# Patient Record
Sex: Female | Born: 1945 | Race: White | Hispanic: No | Marital: Married | Smoking: Never smoker
Health system: Southern US, Community
[De-identification: ages and names within clinical notes are randomized; demographics above are authoritative.]

## PROBLEM LIST (undated history)

## (undated) DIAGNOSIS — E7800 Pure hypercholesterolemia, unspecified: Secondary | ICD-10-CM

## (undated) DIAGNOSIS — R51 Headache: Secondary | ICD-10-CM

## (undated) DIAGNOSIS — M419 Scoliosis, unspecified: Secondary | ICD-10-CM

## (undated) DIAGNOSIS — G500 Trigeminal neuralgia: Secondary | ICD-10-CM

## (undated) DIAGNOSIS — R5383 Other fatigue: Secondary | ICD-10-CM

## (undated) DIAGNOSIS — M7989 Other specified soft tissue disorders: Secondary | ICD-10-CM

## (undated) DIAGNOSIS — H539 Unspecified visual disturbance: Secondary | ICD-10-CM

## (undated) DIAGNOSIS — M4800 Spinal stenosis, site unspecified: Secondary | ICD-10-CM

## (undated) DIAGNOSIS — I10 Essential (primary) hypertension: Secondary | ICD-10-CM

## (undated) DIAGNOSIS — Z87442 Personal history of urinary calculi: Secondary | ICD-10-CM

## (undated) DIAGNOSIS — M549 Dorsalgia, unspecified: Secondary | ICD-10-CM

## (undated) DIAGNOSIS — G4752 REM sleep behavior disorder: Secondary | ICD-10-CM

## (undated) DIAGNOSIS — C801 Malignant (primary) neoplasm, unspecified: Secondary | ICD-10-CM

## (undated) DIAGNOSIS — M1990 Unspecified osteoarthritis, unspecified site: Secondary | ICD-10-CM

## (undated) DIAGNOSIS — R112 Nausea with vomiting, unspecified: Secondary | ICD-10-CM

## (undated) DIAGNOSIS — R0600 Dyspnea, unspecified: Secondary | ICD-10-CM

## (undated) DIAGNOSIS — K219 Gastro-esophageal reflux disease without esophagitis: Secondary | ICD-10-CM

## (undated) DIAGNOSIS — M069 Rheumatoid arthritis, unspecified: Secondary | ICD-10-CM

## (undated) DIAGNOSIS — Z9889 Other specified postprocedural states: Secondary | ICD-10-CM

## (undated) DIAGNOSIS — R251 Tremor, unspecified: Secondary | ICD-10-CM

## (undated) HISTORY — PX: ABDOMINAL HYSTERECTOMY: SHX81

## (undated) HISTORY — PX: AUGMENTATION MAMMAPLASTY: SUR837

## (undated) HISTORY — DX: Trigeminal neuralgia: G50.0

## (undated) HISTORY — PX: TONSILLECTOMY: SUR1361

## (undated) HISTORY — PX: KIDNEY STONE SURGERY: SHX686

## (undated) HISTORY — PX: COLONOSCOPY: SHX174

## (undated) HISTORY — DX: Unspecified visual disturbance: H53.9

## (undated) HISTORY — PX: JOINT REPLACEMENT: SHX530

## (undated) HISTORY — PX: MOHS SURGERY: SUR867

## (undated) HISTORY — PX: TOTAL HIP ARTHROPLASTY: SHX124

---

## 1998-10-07 ENCOUNTER — Other Ambulatory Visit: Admission: RE | Admit: 1998-10-07 | Discharge: 1998-10-07 | Payer: Self-pay | Admitting: Obstetrics and Gynecology

## 1999-02-05 ENCOUNTER — Emergency Department (HOSPITAL_COMMUNITY): Admission: EM | Admit: 1999-02-05 | Discharge: 1999-02-05 | Payer: Self-pay | Admitting: Emergency Medicine

## 1999-02-05 ENCOUNTER — Encounter: Payer: Self-pay | Admitting: Emergency Medicine

## 2000-01-13 ENCOUNTER — Other Ambulatory Visit: Admission: RE | Admit: 2000-01-13 | Discharge: 2000-01-13 | Payer: Self-pay | Admitting: Obstetrics and Gynecology

## 2001-06-04 ENCOUNTER — Encounter: Admission: RE | Admit: 2001-06-04 | Discharge: 2001-06-04 | Payer: Self-pay | Admitting: Obstetrics and Gynecology

## 2001-06-04 ENCOUNTER — Encounter: Payer: Self-pay | Admitting: Obstetrics and Gynecology

## 2002-09-09 ENCOUNTER — Encounter: Payer: Self-pay | Admitting: Obstetrics and Gynecology

## 2002-09-09 ENCOUNTER — Encounter: Admission: RE | Admit: 2002-09-09 | Discharge: 2002-09-09 | Payer: Self-pay | Admitting: Obstetrics and Gynecology

## 2003-10-09 ENCOUNTER — Encounter: Admission: RE | Admit: 2003-10-09 | Discharge: 2003-10-09 | Payer: Self-pay | Admitting: Obstetrics and Gynecology

## 2003-10-09 ENCOUNTER — Encounter: Payer: Self-pay | Admitting: Obstetrics and Gynecology

## 2003-11-10 ENCOUNTER — Other Ambulatory Visit: Admission: RE | Admit: 2003-11-10 | Discharge: 2003-11-10 | Payer: Self-pay | Admitting: Obstetrics and Gynecology

## 2004-11-08 ENCOUNTER — Encounter: Admission: RE | Admit: 2004-11-08 | Discharge: 2004-11-08 | Payer: Self-pay | Admitting: Obstetrics and Gynecology

## 2006-02-14 ENCOUNTER — Encounter: Admission: RE | Admit: 2006-02-14 | Discharge: 2006-02-14 | Payer: Self-pay | Admitting: Obstetrics and Gynecology

## 2006-04-04 ENCOUNTER — Other Ambulatory Visit: Admission: RE | Admit: 2006-04-04 | Discharge: 2006-04-04 | Payer: Self-pay | Admitting: Obstetrics and Gynecology

## 2007-02-15 ENCOUNTER — Encounter: Admission: RE | Admit: 2007-02-15 | Discharge: 2007-02-15 | Payer: Self-pay | Admitting: Obstetrics and Gynecology

## 2008-03-18 ENCOUNTER — Encounter: Admission: RE | Admit: 2008-03-18 | Discharge: 2008-03-18 | Payer: Self-pay | Admitting: Family Medicine

## 2008-09-03 ENCOUNTER — Encounter: Admission: RE | Admit: 2008-09-03 | Discharge: 2008-11-02 | Payer: Self-pay | Admitting: Orthopedic Surgery

## 2009-02-03 ENCOUNTER — Encounter: Admission: RE | Admit: 2009-02-03 | Discharge: 2009-02-03 | Payer: Self-pay | Admitting: Obstetrics and Gynecology

## 2009-04-22 ENCOUNTER — Encounter: Admission: RE | Admit: 2009-04-22 | Discharge: 2009-04-22 | Payer: Self-pay | Admitting: Obstetrics and Gynecology

## 2009-10-04 ENCOUNTER — Encounter (HOSPITAL_COMMUNITY): Admission: RE | Admit: 2009-10-04 | Discharge: 2009-11-26 | Payer: Self-pay | Admitting: Orthopedic Surgery

## 2010-01-19 ENCOUNTER — Inpatient Hospital Stay (HOSPITAL_COMMUNITY): Admission: RE | Admit: 2010-01-19 | Discharge: 2010-01-25 | Payer: Self-pay | Admitting: Orthopedic Surgery

## 2010-03-29 ENCOUNTER — Encounter: Admission: RE | Admit: 2010-03-29 | Discharge: 2010-06-16 | Payer: Self-pay | Admitting: Orthopedic Surgery

## 2010-05-05 ENCOUNTER — Encounter: Admission: RE | Admit: 2010-05-05 | Discharge: 2010-05-05 | Payer: Self-pay | Admitting: Obstetrics and Gynecology

## 2010-06-01 ENCOUNTER — Encounter: Admission: RE | Admit: 2010-06-01 | Discharge: 2010-06-01 | Payer: Self-pay | Admitting: Specialist

## 2010-06-29 ENCOUNTER — Encounter: Admission: RE | Admit: 2010-06-29 | Discharge: 2010-08-17 | Payer: Self-pay | Admitting: Specialist

## 2011-02-07 ENCOUNTER — Ambulatory Visit: Payer: MEDICARE | Admitting: Physical Therapy

## 2011-02-20 ENCOUNTER — Ambulatory Visit: Payer: MEDICARE | Attending: Orthopedic Surgery | Admitting: Physical Therapy

## 2011-02-20 DIAGNOSIS — M25669 Stiffness of unspecified knee, not elsewhere classified: Secondary | ICD-10-CM | POA: Insufficient documentation

## 2011-02-20 DIAGNOSIS — Z96659 Presence of unspecified artificial knee joint: Secondary | ICD-10-CM | POA: Insufficient documentation

## 2011-02-20 DIAGNOSIS — IMO0001 Reserved for inherently not codable concepts without codable children: Secondary | ICD-10-CM | POA: Insufficient documentation

## 2011-02-20 DIAGNOSIS — M25569 Pain in unspecified knee: Secondary | ICD-10-CM | POA: Insufficient documentation

## 2011-02-23 ENCOUNTER — Ambulatory Visit: Payer: MEDICARE | Admitting: Physical Therapy

## 2011-02-23 ENCOUNTER — Ambulatory Visit: Payer: MEDICARE | Attending: Orthopedic Surgery | Admitting: Physical Therapy

## 2011-02-23 DIAGNOSIS — M25569 Pain in unspecified knee: Secondary | ICD-10-CM | POA: Insufficient documentation

## 2011-02-23 DIAGNOSIS — Z96659 Presence of unspecified artificial knee joint: Secondary | ICD-10-CM | POA: Insufficient documentation

## 2011-02-23 DIAGNOSIS — IMO0001 Reserved for inherently not codable concepts without codable children: Secondary | ICD-10-CM | POA: Insufficient documentation

## 2011-02-23 DIAGNOSIS — M25669 Stiffness of unspecified knee, not elsewhere classified: Secondary | ICD-10-CM | POA: Insufficient documentation

## 2011-03-13 LAB — BASIC METABOLIC PANEL
BUN: 7 mg/dL (ref 6–23)
BUN: 7 mg/dL (ref 6–23)
CO2: 25 mEq/L (ref 19–32)
CO2: 26 mEq/L (ref 19–32)
Calcium: 7.8 mg/dL — ABNORMAL LOW (ref 8.4–10.5)
Calcium: 8.2 mg/dL — ABNORMAL LOW (ref 8.4–10.5)
Calcium: 8.7 mg/dL (ref 8.4–10.5)
Chloride: 94 mEq/L — ABNORMAL LOW (ref 96–112)
Chloride: 98 mEq/L (ref 96–112)
Creatinine, Ser: 0.58 mg/dL (ref 0.4–1.2)
Creatinine, Ser: 0.62 mg/dL (ref 0.4–1.2)
Creatinine, Ser: 0.63 mg/dL (ref 0.4–1.2)
Creatinine, Ser: 0.68 mg/dL (ref 0.4–1.2)
GFR calc Af Amer: >60 mL/min (ref >60)
GFR calc Af Amer: >60 mL/min (ref >60)
GFR calc Af Amer: >60 mL/min (ref >60)
GFR calc non Af Amer: >60 mL/min (ref >60)
GFR calc non Af Amer: >60 mL/min (ref >60)
GFR calc non Af Amer: >60 mL/min (ref >60)
GFR calc non Af Amer: >60 mL/min (ref >60)
Glucose, Bld: 102 mg/dL — ABNORMAL HIGH (ref 70–99)
Glucose, Bld: 129 mg/dL — ABNORMAL HIGH (ref 70–99)
Glucose, Bld: 92 mg/dL (ref 70–99)
Glucose, Bld: 96 mg/dL (ref 70–99)
Potassium: 4.2 mEq/L (ref 3.5–5.1)
Potassium: 4.5 mEq/L (ref 3.5–5.1)
Potassium: 4.7 mEq/L (ref 3.5–5.1)
Sodium: 129 mEq/L — ABNORMAL LOW (ref 135–145)
Sodium: 130 mEq/L — ABNORMAL LOW (ref 135–145)
Sodium: 131 mEq/L — ABNORMAL LOW (ref 135–145)

## 2011-03-13 LAB — PROTIME-INR
INR: 1.08 (ref 0.00–1.49)
INR: 1.22 (ref 0.00–1.49)
INR: 1.6 — ABNORMAL HIGH (ref 0.00–1.49)
INR: 1.99 — ABNORMAL HIGH (ref 0.00–1.49)
INR: 2.14 — ABNORMAL HIGH (ref 0.00–1.49)
INR: 2.29 — ABNORMAL HIGH (ref 0.00–1.49)
Prothrombin Time: 15.3 seconds — ABNORMAL HIGH (ref 11.6–15.2)

## 2011-03-13 LAB — HEPATIC FUNCTION PANEL
Bilirubin, Direct: 0.1 mg/dL (ref 0.0–0.3)
Indirect Bilirubin: 0.4 mg/dL (ref 0.3–0.9)

## 2011-03-13 LAB — URINE CULTURE: Special Requests: NEGATIVE

## 2011-03-13 LAB — COMPREHENSIVE METABOLIC PANEL
ALT: 15 U/L (ref 0–35)
AST: 20 U/L (ref 0–37)
Albumin: 3.7 g/dL (ref 3.5–5.2)
BUN: 18 mg/dL (ref 6–23)
Calcium: 8.9 mg/dL (ref 8.4–10.5)
GFR calc Af Amer: >60 mL/min (ref >60)
GFR calc non Af Amer: >60 mL/min (ref >60)
Glucose, Bld: 88 mg/dL (ref 70–99)
Sodium: 138 mEq/L (ref 135–145)

## 2011-03-13 LAB — CBC
HCT: 28.3 % — ABNORMAL LOW (ref 36.0–46.0)
HCT: 29.8 % — ABNORMAL LOW (ref 36.0–46.0)
Hemoglobin: 10.4 g/dL — ABNORMAL LOW (ref 12.0–15.0)
Hemoglobin: 12.9 g/dL (ref 12.0–15.0)
Hemoglobin: 9 g/dL — ABNORMAL LOW (ref 12.0–15.0)
Hemoglobin: 9.4 g/dL — ABNORMAL LOW (ref 12.0–15.0)
MCHC: 33.3 g/dL (ref 30.0–36.0)
MCHC: 33.5 g/dL (ref 30.0–36.0)
MCHC: 34.1 g/dL (ref 30.0–36.0)
MCHC: 35.1 g/dL (ref 30.0–36.0)
MCV: 93 fL (ref 78.0–100.0)
MCV: 93.1 fL (ref 78.0–100.0)
MCV: 93.8 fL (ref 78.0–100.0)
MCV: 93.9 fL (ref 78.0–100.0)
Platelets: 130 10*3/uL — ABNORMAL LOW (ref 150–400)
RBC: 2.98 MIL/uL — ABNORMAL LOW (ref 3.87–5.11)
RBC: 3.04 MIL/uL — ABNORMAL LOW (ref 3.87–5.11)
RDW: 12.2 % (ref 11.5–15.5)
RDW: 12.5 % (ref 11.5–15.5)
RDW: 12.9 % (ref 11.5–15.5)
RDW: 13.3 % (ref 11.5–15.5)

## 2011-03-13 LAB — URIC ACID: Uric Acid, Serum: 1.9 mg/dL — ABNORMAL LOW (ref 2.4–7.0)

## 2011-03-13 LAB — OSMOLALITY: Osmolality: 250 mOsm/kg — ABNORMAL LOW (ref 275–300)

## 2011-03-13 LAB — URINALYSIS, ROUTINE W REFLEX MICROSCOPIC
Bilirubin Urine: NEGATIVE
Bilirubin Urine: NEGATIVE
Glucose, UA: NEGATIVE mg/dL
Hgb urine dipstick: NEGATIVE
Hgb urine dipstick: NEGATIVE
Ketones, ur: NEGATIVE mg/dL
Ketones, ur: NEGATIVE mg/dL
Nitrite: NEGATIVE
Specific Gravity, Urine: 1.013 (ref 1.005–1.030)
Specific Gravity, Urine: 1.019 (ref 1.005–1.030)
pH: 7.5 (ref 5.0–8.0)

## 2011-03-13 LAB — BLOOD GAS, ARTERIAL
Acid-base deficit: 0.5 mmol/L (ref 0.0–2.0)
Drawn by: 275531
O2 Saturation: 98.6 %
Patient temperature: 98.6

## 2011-03-13 LAB — TSH: TSH: 1.387 u[IU]/mL (ref 0.350–4.500)

## 2011-03-13 LAB — URINE MICROSCOPIC-ADD ON

## 2011-03-13 LAB — ABO/RH: ABO/RH(D): O POS

## 2011-03-13 LAB — TYPE AND SCREEN: ABO/RH(D): O POS

## 2011-03-16 LAB — BASIC METABOLIC PANEL
BUN: 8 mg/dL (ref 6–23)
GFR calc non Af Amer: >60 mL/min (ref >60)
Glucose, Bld: 91 mg/dL (ref 70–99)
Potassium: 4.1 mEq/L (ref 3.5–5.1)

## 2011-05-25 ENCOUNTER — Other Ambulatory Visit: Payer: Self-pay | Admitting: Obstetrics and Gynecology

## 2011-05-25 DIAGNOSIS — Z1231 Encounter for screening mammogram for malignant neoplasm of breast: Secondary | ICD-10-CM

## 2011-05-31 ENCOUNTER — Ambulatory Visit
Admission: RE | Admit: 2011-05-31 | Discharge: 2011-05-31 | Disposition: A | Discharge status: Home / Self Care | Payer: Medicare Other | Source: Ambulatory Visit | Attending: Obstetrics and Gynecology | Admitting: Obstetrics and Gynecology

## 2011-05-31 DIAGNOSIS — Z1231 Encounter for screening mammogram for malignant neoplasm of breast: Secondary | ICD-10-CM

## 2011-09-27 ENCOUNTER — Other Ambulatory Visit: Payer: Self-pay | Admitting: Orthopedic Surgery

## 2011-09-27 DIAGNOSIS — M545 Low back pain, unspecified: Secondary | ICD-10-CM

## 2011-10-12 ENCOUNTER — Ambulatory Visit
Admission: RE | Admit: 2011-10-12 | Discharge: 2011-10-12 | Disposition: A | Discharge status: Home / Self Care | Payer: Medicare Other | Source: Ambulatory Visit | Attending: Orthopedic Surgery | Admitting: Orthopedic Surgery

## 2011-10-12 DIAGNOSIS — M545 Low back pain: Secondary | ICD-10-CM

## 2012-02-07 ENCOUNTER — Ambulatory Visit: Payer: Medicare Other | Attending: Orthopedic Surgery | Admitting: Physical Therapy

## 2012-02-07 DIAGNOSIS — IMO0001 Reserved for inherently not codable concepts without codable children: Secondary | ICD-10-CM | POA: Insufficient documentation

## 2012-02-07 DIAGNOSIS — M25673 Stiffness of unspecified ankle, not elsewhere classified: Secondary | ICD-10-CM | POA: Insufficient documentation

## 2012-02-07 DIAGNOSIS — M25676 Stiffness of unspecified foot, not elsewhere classified: Secondary | ICD-10-CM | POA: Insufficient documentation

## 2012-02-07 DIAGNOSIS — M25579 Pain in unspecified ankle and joints of unspecified foot: Secondary | ICD-10-CM | POA: Insufficient documentation

## 2012-02-09 ENCOUNTER — Ambulatory Visit: Payer: Medicare Other | Admitting: Physical Therapy

## 2012-02-12 ENCOUNTER — Ambulatory Visit: Payer: Medicare Other | Admitting: Physical Therapy

## 2012-02-14 ENCOUNTER — Ambulatory Visit: Payer: Medicare Other | Admitting: Physical Therapy

## 2012-03-01 ENCOUNTER — Ambulatory Visit: Payer: Medicare Other | Attending: Orthopedic Surgery | Admitting: Physical Therapy

## 2012-03-01 DIAGNOSIS — M25676 Stiffness of unspecified foot, not elsewhere classified: Secondary | ICD-10-CM | POA: Insufficient documentation

## 2012-03-01 DIAGNOSIS — M25673 Stiffness of unspecified ankle, not elsewhere classified: Secondary | ICD-10-CM | POA: Insufficient documentation

## 2012-03-01 DIAGNOSIS — IMO0001 Reserved for inherently not codable concepts without codable children: Secondary | ICD-10-CM | POA: Insufficient documentation

## 2012-03-01 DIAGNOSIS — M25579 Pain in unspecified ankle and joints of unspecified foot: Secondary | ICD-10-CM | POA: Insufficient documentation

## 2012-04-29 ENCOUNTER — Other Ambulatory Visit: Payer: Self-pay | Admitting: Obstetrics and Gynecology

## 2012-04-29 DIAGNOSIS — Z1231 Encounter for screening mammogram for malignant neoplasm of breast: Secondary | ICD-10-CM

## 2012-05-14 ENCOUNTER — Encounter (HOSPITAL_BASED_OUTPATIENT_CLINIC_OR_DEPARTMENT_OTHER): Payer: Self-pay | Admitting: Emergency Medicine

## 2012-05-14 ENCOUNTER — Emergency Department (HOSPITAL_BASED_OUTPATIENT_CLINIC_OR_DEPARTMENT_OTHER)
Admission: EM | Admit: 2012-05-14 | Discharge: 2012-05-14 | Disposition: A | Discharge status: Home / Self Care | Payer: Medicare Other | Attending: Emergency Medicine | Admitting: Emergency Medicine

## 2012-05-14 ENCOUNTER — Emergency Department (HOSPITAL_BASED_OUTPATIENT_CLINIC_OR_DEPARTMENT_OTHER): Payer: Medicare Other

## 2012-05-14 DIAGNOSIS — F411 Generalized anxiety disorder: Secondary | ICD-10-CM | POA: Insufficient documentation

## 2012-05-14 DIAGNOSIS — I10 Essential (primary) hypertension: Secondary | ICD-10-CM | POA: Insufficient documentation

## 2012-05-14 DIAGNOSIS — IMO0001 Reserved for inherently not codable concepts without codable children: Secondary | ICD-10-CM | POA: Insufficient documentation

## 2012-05-14 DIAGNOSIS — E7800 Pure hypercholesterolemia, unspecified: Secondary | ICD-10-CM | POA: Insufficient documentation

## 2012-05-14 DIAGNOSIS — S61409A Unspecified open wound of unspecified hand, initial encounter: Secondary | ICD-10-CM | POA: Insufficient documentation

## 2012-05-14 DIAGNOSIS — M069 Rheumatoid arthritis, unspecified: Secondary | ICD-10-CM | POA: Insufficient documentation

## 2012-05-14 DIAGNOSIS — Y92009 Unspecified place in unspecified non-institutional (private) residence as the place of occurrence of the external cause: Secondary | ICD-10-CM | POA: Insufficient documentation

## 2012-05-14 DIAGNOSIS — S61459A Open bite of unspecified hand, initial encounter: Secondary | ICD-10-CM

## 2012-05-14 HISTORY — DX: Essential (primary) hypertension: I10

## 2012-05-14 HISTORY — DX: Pure hypercholesterolemia, unspecified: E78.00

## 2012-05-14 HISTORY — DX: Rheumatoid arthritis, unspecified: M06.9

## 2012-05-14 HISTORY — DX: Trigeminal neuralgia: G50.0

## 2012-05-14 LAB — APTT: aPTT: 30 seconds (ref 24–37)

## 2012-05-14 LAB — DIFFERENTIAL
Lymphocytes Relative: 17 % (ref 12–46)
Lymphs Abs: 1.5 10*3/uL (ref 0.7–4.0)
Monocytes Relative: 10 % (ref 3–12)
Neutrophils Relative %: 73 % (ref 43–77)

## 2012-05-14 LAB — PROTIME-INR: INR: 0.94 (ref 0.00–1.49)

## 2012-05-14 LAB — CBC
Hemoglobin: 13.7 g/dL (ref 12.0–15.0)
MCH: 31.6 pg (ref 26.0–34.0)
MCV: 91.2 fL (ref 78.0–100.0)
Platelets: 191 10*3/uL (ref 150–400)
RBC: 4.33 MIL/uL (ref 3.87–5.11)
WBC: 9.2 10*3/uL (ref 4.0–10.5)

## 2012-05-14 LAB — BASIC METABOLIC PANEL
BUN: 25 mg/dL — ABNORMAL HIGH (ref 6–23)
CO2: 30 mEq/L (ref 19–32)
Glucose, Bld: 95 mg/dL (ref 70–99)
Potassium: 3.5 mEq/L (ref 3.5–5.1)
Sodium: 140 mEq/L (ref 135–145)

## 2012-05-14 MED ORDER — AMOXICILLIN-POT CLAVULANATE 875-125 MG PO TABS: 1.0000 | ORAL_TABLET | Freq: Two times a day (BID) | ORAL | Status: AC

## 2012-05-14 MED ORDER — OXYCODONE HCL 5 MG PO CAPS: 5.0000 mg | ORAL_CAPSULE | ORAL | Status: AC | PRN

## 2012-05-14 MED ORDER — BUPIVACAINE-EPINEPHRINE PF 0.5-1:200000 % IJ SOLN
20.0000 mL | Freq: Once | INTRAMUSCULAR | Status: AC
  Administered 2012-05-14: 100 mg
  Filled 2012-05-14: qty 20

## 2012-05-14 MED ORDER — PIPERACILLIN-TAZOBACTAM 3.375 G IVPB
3.3750 g | Freq: Once | INTRAVENOUS | Status: AC
  Administered 2012-05-14: 3.375 g via INTRAVENOUS
  Filled 2012-05-14: qty 50

## 2012-05-14 MED ORDER — HYDROMORPHONE HCL PF 1 MG/ML IJ SOLN
0.5000 mg | Freq: Once | INTRAMUSCULAR | Status: AC
  Administered 2012-05-14: 0.5 mg via INTRAVENOUS
  Filled 2012-05-14: qty 1

## 2012-05-14 MED ORDER — SODIUM CHLORIDE 0.9 % IV SOLN
3.0000 g | Freq: Four times a day (QID) | INTRAVENOUS | Status: DC
  Administered 2012-05-14: 3 g via INTRAVENOUS
  Filled 2012-05-14: qty 3

## 2012-05-14 MED ORDER — SODIUM CHLORIDE 0.9 % IV SOLN
Freq: Once | INTRAVENOUS | Status: AC
  Administered 2012-05-14: 12:00:00 via INTRAVENOUS

## 2012-05-14 MED ORDER — TETANUS-DIPHTH-ACELL PERTUSSIS 5-2.5-18.5 LF-MCG/0.5 IM SUSP
0.5000 mL | Freq: Once | INTRAMUSCULAR | Status: AC
  Administered 2012-05-14: 0.5 mL via INTRAMUSCULAR
  Filled 2012-05-14: qty 0.5

## 2012-05-14 NOTE — ED Notes (Signed)
Ambulatory to bathroom with assistance from husband.  Husband has prescriptions from Dr. Amedeo Plenty for Augmentin and Oxycodone.  He understands to follow up in the office in the am.  Dressing intact with no bleeding noted.

## 2012-05-14 NOTE — ED Provider Notes (Addendum)
History     CSN: [id number]  Arrival date & time 05/14/12  6195   First MD Initiated Contact with Patient 05/14/12 [phone]      Chief Complaint  Patient presents with  . Animal Bite  . Hand Injury    (Consider location/radiation/quality/duration/timing/severity/associated sxs/prior treatment) HPI Pt states she went to break up fight between the neighbor's dog and her cat. Her cat, who is up to date on vaccinations, bit the dorsum of her right hand and scratched her R wrist. She has had persistent bleeding form site. Was taken to EMS and dressing placed. Pt last Td was > 5 yrs ago.  Past Medical History  Diagnosis Date  . 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 Anxiety   . Rheumatoid arthritis   . Hypercholesteremia   . Hypertension     Past Surgical History  Procedure Date  . Total hip arthroplasty   . Abdominal hysterectomy   . Joint replacement     knee  . Tonsillectomy     No family history on file.  History  Substance Use Topics  . Smoking status: Not on file  . Smokeless tobacco: Not on file  . Alcohol Use:     OB History    Grav Para Term Preterm Abortions TAB SAB Ect Mult Living                  Review of Systems  Skin: Positive for wound.  Neurological: Positive for light-headedness.    Allergies  Adhesive; Morphine and related; and Provigil  Home Medications   Current Outpatient Rx  Name Route Sig Dispense Refill  . AMLODIPINE-OLMESARTAN 5-40 MG PO TABS Oral Take 1 tablet by mouth daily.    . ASPIRIN 81 MG PO TABS Oral Take 81 mg by mouth daily.    Marland Kitchen CLONAZEPAM 0.5 MG PO TABS Oral Take 0.5 mg by mouth daily.     Marland Kitchen ESTRADIOL 0.1 MG/24HR TD PTWK Transdermal Place 1 patch onto the skin once a week.    Marland Kitchen ETANERCEPT 50 MG/ML [place] SOLN Subcutaneous Inject 50 mg into the skin once a week. Last taken on 05-11-12    . EZETIMIBE 10 MG PO TABS Oral Take 10 mg by mouth daily.    . OMEGA-3 FATTY ACIDS 1000 MG PO CAPS Oral Take 2 g by mouth daily.     Marland Kitchen FOLIC ACID 1 MG PO TABS  Oral Take 1 mg by mouth daily.    . IMIPRAMINE HCL 25 MG PO TABS Oral Take 25 mg by mouth at bedtime.    Marland Kitchen LAMOTRIGINE 100 MG PO TABS Oral Take 100 mg by mouth 2 (two) times daily.    Marland Kitchen LORATADINE 10 MG PO TABS Oral Take 10 mg by mouth daily.    . MELOXICAM 15 MG PO TABS Oral Take 15 mg by mouth daily.    Marland Kitchen OMEPRAZOLE 20 MG PO CPDR Oral Take 20 mg by mouth daily.    Marland Kitchen OXCARBAZEPINE 300 MG PO TABS Oral Take 300 mg by mouth 4 (four) times daily - after meals and at bedtime.    Marland Kitchen PRAVASTATIN SODIUM 40 MG PO TABS Oral Take 40 mg by mouth daily.    . SENNA-DOCUSATE SODIUM 8.6-50 MG PO TABS Oral Take 1 tablet by mouth daily.    Marland Kitchen VITAMIN D (ERGOCALCIFEROL) 50000 UNITS PO CAPS Oral Take 50,000 Units by mouth every 7 (seven) days.      BP 185/93  Pulse 83  Temp(Src) 97.6 F (36.4  C) (Oral)  Resp 16  SpO2 100%  Physical Exam  Constitutional: She is oriented to person, place, and time. She appears well-developed and well-nourished.       Crying   HENT:  Head: Normocephalic and atraumatic.  Eyes: Pupils are equal, round, and reactive to light.  Neck: Normal range of motion. Neck supple.  Cardiovascular: Normal rate and regular rhythm.   Pulmonary/Chest: Effort normal and breath sounds normal. No respiratory distress. She has no wheezes. She has no rales.  Abdominal: There is no tenderness. There is no rebound and no guarding.  Musculoskeletal:       3 cm irregular laceration overlying 2-3 metacarpals of dorsum of R hand with active arteriole bleeding. Mild swelling of fingers distally. Good distal cap refill and sensation. No visualized FB  Neurological: She is alert and oriented to person, place, and time.    ED Course  Procedures (including critical care time)  Labs Reviewed  BASIC METABOLIC PANEL - Abnormal; Notable for the following:    BUN 25 (*)    GFR calc non Af Amer 76 (*)    GFR calc Af Amer 88 (*)    All other components within normal limits  CBC  DIFFERENTIAL    PROTIME-INR  APTT   Dg Hand Complete Right  05/14/2012  *RADIOLOGY REPORT*  Clinical Data: Cat scratch to posterior hand.  RIGHT HAND - COMPLETE 3+ VIEW  Comparison: None.  Findings: There is dorsal soft tissue swelling.  Soft tissue evaluation is limited by overlying bandages.  There is no definite soft tissue emphysema or foreign body.  There is no evidence of acute fracture, dislocation or bone destruction.  Interphalangeal and first metacarpal phalangeal degenerative changes are noted, most notable at the third proximal interphalangeal joint.  IMPRESSION: Dorsal soft tissue swelling.  No acute osseous findings.  Original Report Authenticated By: Vivia Ewing, M.D.     1. Cat bite of hand     Wound injected with 1% lidocaine with epi and thoroughly explored using suction. Wound extensively irrigated and bleeding controlled with cautery of arteriole. Only mild oozing present when wound rewrapped.    MDM  Given nature of wound and high likelihood for infection will start IV abx and discuss with hand surgery for possible OR washout.         Julianne Rice, MD 05/14/12 [place], MD 05/17/12 [phone]

## 2012-05-14 NOTE — ED Notes (Signed)
Assisted Dr. Amedeo Plenty with wound irrigation. Patient tolerated well.

## 2012-05-14 NOTE — Discharge Summary (Signed)
  See full op note and dictation etc  MD

## 2012-05-14 NOTE — ED Notes (Signed)
Discharged home with written and verbal instruction from Dr. Amedeo Plenty.  Left ambulatory with husband.

## 2012-05-14 NOTE — ED Provider Notes (Signed)
Patient arrives from [facility] and waiting for Dr. Amedeo Plenty. She is comfortable, no needs at this time.  Leotis Shames, PA-C 05/14/12 1311

## 2012-05-14 NOTE — ED Notes (Signed)
Dr Amedeo Plenty in to see patient.  Will do procedure in ED. OK to feed patient.

## 2012-05-14 NOTE — Discharge Instructions (Signed)

## 2012-05-14 NOTE — ED Provider Notes (Signed)
Dr. Amedeo Plenty in to see patient.  Patient with animal bite to right hand. Dr. Amedeo Plenty performed I&D and provided patient with discharge instructions and prescriptions.  Norman Herrlich, NP 05/14/12 2136

## 2012-05-14 NOTE — Consult Note (Signed)
  See Dictation #[id number] Willa Frater MD

## 2012-05-14 NOTE — ED Provider Notes (Signed)
Medical screening examination/treatment/procedure(s) were performed by non-physician practitioner and as supervising physician I was immediately available for consultation/collaboration.   Lezlie Octave, MD 05/14/12 [phone]

## 2012-05-14 NOTE — ED Notes (Signed)
Patient arrived from [place] with dressing intact.  Small amount of bleeding noted on dressing.  Rates pain as 2/10.  Nehemiah Settle PA notified of same.

## 2012-05-14 NOTE — ED Notes (Addendum)
Pt went to get her injured cat that had been attacked by a dog.  Cat scratched her right hand.  Pt has lacerations to right hand.  Pt very tearful and upset as her cat died.

## 2012-05-14 NOTE — ED Notes (Signed)
Dr Amedeo Plenty noted of patient's arrival to CDU. Awaiting further orders.

## 2012-05-14 NOTE — Consult Note (Signed)
Reason for Consult:Cat bite to the right hand  Referring Physician: ER staff  Kristen Hansen is an 66 y.o. female.  HPI: Marland KitchenMarland KitchenPatient presents for evaluation and treatment of the of their upper extremity predicament. The patient denies neck back chest or of abdominal pain. The patient notes that they have no lower extremity problems. The patient from primarily complains of the upper extremity pain noted.  Patient presents with a cat bite to the hand with pain and bleeding  Was trying to break up a fight in her yard between her cat and 2 dogs.  Her cat bit her and ultimately died from the dog injuries( the cat that is) Patient is with her husband and has been give zosyn at [facility] and transferred for my evaluation  Past Medical History  Diagnosis Date  . 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 Anxiety   . Rheumatoid arthritis   . Hypercholesteremia   . Hypertension     Past Surgical History  Procedure Date  . Total hip arthroplasty   . Abdominal hysterectomy   . Joint replacement     knee  . Tonsillectomy     No family history on file.  Social History:  does not have a smoking history on file. She does not have any smokeless tobacco history on file. Her alcohol and drug histories not on file.  Allergies:  Allergies  Allergen Reactions  . Adhesive (Tape)   . Morphine And Related Hives  . Provigil (Modafinil)     dizzyness    Medications: I have reviewed the patient's current medications.  Results for orders placed during the hospital encounter of 05/14/12 (from the past 48 hour(s))  CBC     Status: Normal   Collection Time   05/14/12 10:15 AM      Component Value Range Comment   WBC 9.2  4.0 - 10.5 (K/uL)    RBC 4.33  3.87 - 5.11 (MIL/uL)    Hemoglobin 13.7  12.0 - 15.0 (g/dL)    HCT 39.5  36.0 - 46.0 (%)    MCV 91.2  78.0 - 100.0 (fL)    MCH 31.6  26.0 - 34.0 (pg)    MCHC 34.7  30.0 - 36.0 (g/dL)    RDW 12.3  11.5 - 15.5 (%)    Platelets 191  150 - 400 (K/uL)   DIFFERENTIAL     Status:  Normal   Collection Time   05/14/12 10:15 AM      Component Value Range Comment   Neutrophils Relative 73  43 - 77 (%)    Neutro Abs 6.7  1.7 - 7.7 (K/uL)    Lymphocytes Relative 17  12 - 46 (%)    Lymphs Abs 1.5  0.7 - 4.0 (K/uL)    Monocytes Relative 10  3 - 12 (%)    Monocytes Absolute 0.9  0.1 - 1.0 (K/uL)    Eosinophils Relative 1  0 - 5 (%)    Eosinophils Absolute 0.1  0.0 - 0.7 (K/uL)    Basophils Relative 0  0 - 1 (%)    Basophils Absolute 0.0  0.0 - 0.1 (K/uL)   BASIC METABOLIC PANEL     Status: Abnormal   Collection Time   05/14/12 10:15 AM      Component Value Range Comment   Sodium 140  135 - 145 (mEq/L)    Potassium 3.5  3.5 - 5.1 (mEq/L)    Chloride 102  96 - 112 (mEq/L)    CO2  30  19 - 32 (mEq/L)    Glucose, Bld 95  70 - 99 (mg/dL)    BUN 25 (*) 6 - 23 (mg/dL)    Creatinine, Ser 0.80  0.50 - 1.10 (mg/dL)    Calcium 9.6  8.4 - 10.5 (mg/dL)    GFR calc non Af Amer 76 (*) >90 (mL/min)    GFR calc Af Amer 88 (*) >90 (mL/min)   PROTIME-INR     Status: Normal   Collection Time   05/14/12 10:15 AM      Component Value Range Comment   Prothrombin Time 12.8  11.6 - 15.2 (seconds)    INR 0.94  0.00 - 1.49    APTT     Status: Normal   Collection Time   05/14/12 10:15 AM      Component Value Range Comment   aPTT 30  24 - 37 (seconds)     Dg Hand Complete Right  05/14/2012  *RADIOLOGY REPORT*  Clinical Data: Cat scratch to posterior hand.  RIGHT HAND - COMPLETE 3+ VIEW  Comparison: None.  Findings: There is dorsal soft tissue swelling.  Soft tissue evaluation is limited by overlying bandages.  There is no definite soft tissue emphysema or foreign body.  There is no evidence of acute fracture, dislocation or bone destruction.  Interphalangeal and first metacarpal phalangeal degenerative changes are noted, most notable at the third proximal interphalangeal joint.  IMPRESSION: Dorsal soft tissue swelling.  No acute osseous findings.  Original Report Authenticated By: Vivia Ewing, M.D.    Review of Systems  Constitutional: Negative.   Respiratory: Negative.   Cardiovascular: Negative.   Gastrointestinal: Negative.   Genitourinary: Negative.   Skin: Negative.   Neurological: Negative.    Blood pressure 158/93, pulse 88, temperature 98 F (36.7 C), temperature source Oral, resp. rate 20, SpO2 100.00%. Physical Exam..The patient is alert and oriented in no acute distress the patient complains of pain in the affected upper extremity. The patient is noted to have a normal HEENT exam. Lung fields show equal chest expansion and no shortness of breath abdomen exam is nontender without distention. Lower extremity examination does not show any fracture dislocation or blood clot symptoms. Pelvis is stable neck and back are stable and nontender Cat bite to dorsal hand with 3 cm injury and jagged tissue  Assessment/Plan: Marland KitchenMarland KitchenWe are planning surgery for your upper extremity. The risk and benefits of surgery include risk of bleeding infection anesthesia damage to normal structures and failure of the surgery to accomplish its intended goals of relieving symptoms and restoring function with this in mind we'll going to proceed. I have specifically discussed with the patient the pre-and postoperative regime and the does and don'ts and risk and benefits in great detail. Risk and benefits of surgery also include risk of dystrophy chronic nerve pain failure of the healing process to go onto completion and other inherent risks of surgery The relavent the pathophysiology of the disease/injury process, as well as the alternatives for treatment and postoperative course of action has been discussed in great detail with the patient who desires to proceed.  We will do everything in our power to help you (the patient) restore function to the upper extremity. Is a pleasure to see this patient today.  Plan for I&D of the hand   Paulene Floor 05/14/2012, 5:54 PM

## 2012-05-14 NOTE — ED Notes (Signed)
Right hand elevated on pillow- cap refill < seconds.  Dressing intact with no bleeding noted at present.  Awaiting hand surgery consult.  Husband at bedside

## 2012-05-14 NOTE — ED Notes (Addendum)
Patient is being transferred to [facility] ED--via [facility]--spoke with Marcello Moores

## 2012-05-15 NOTE — Op Note (Signed)
NAME:  Kristen Hansen, Kristen Hansen                 ACCOUNT NO.:  [id number]  MEDICAL RECORD NO.:  [id number]  LOCATION:                                 FACILITY:  PHYSICIAN:  Satira Anis. , M.D.DATE OF BIRTH:  08/14/1946  DATE OF PROCEDURE: DATE OF DISCHARGE:                              OPERATIVE REPORT   PREOPERATIVE DIAGNOSIS:  Large cat bite, right hand.  POSTOPERATIVE DIAGNOSIS:  Large cat bite, right hand.  PROCEDURES: 1. Tenolysis and tenosynovectomy, extensor indicis proprius and     extensor digitorum communis tendons, right hand. 2. Irrigation and debridement of skin, subcutaneous tissue, tendon,     and muscle, right hand, dorsal aspect. 3. Dorsal sensory branch neurolysis and exploration, right hand.  SURGEON:  Satira Anis. Amedeo Plenty, MD  ASSISTANT:  None.  COMPLICATIONS:  None.  ANESTHESIA:  Block anesthetic, administered by myself.  TOURNIQUET TIME:  0.  INDICATIONS FOR THE PROCEDURE:  Cat bite.  Today, she was breaking up a cat fight between her cat and the neighborhood dogs who had gotten into her yard.  Her cat unfortunately expired.  She presents with an open gaping wound in pain.  I have discussed her all issues.  She came for [employer], at their request.  She was given Zosyn at the facility.  On examination, a pleasant female, alert and oriented, in no acute distress.  Vital signs stable.  The patient understands risks and benefits of surgery.  OPERATIVE PROCEDURE:  The patient was seen by myself, given 20 mL of mixture of Sensorcaine with epinephrine 0.25% and 1% lidocaine without epinephrine.  Following this field block, the patient underwent thorough Betadine scrub and paint x2 separate scrubs by myself, followed by Hibiclens scrub, followed by demonstration of greater than 4 L of saline through and through the area.  During this time, I removed 2 mm rim of skin tissue.  Following this, I performed the EIP and EDC tenolysis, tenosynovectomy, and  a sensory branch to the dorsal radial nerve neurolysis.  Following this, I irrigated additionally, placed a Penrose drain, and then performed 1 alone stitch to approximate the tissue.  Following this, she was dressed sterilely.  She is going to be monitored and discharged home.  I will give her additional 3 g Unasyn.  See me in the office. Started her on Augmentin 875 mg 1 p.o. b.i.d., and she understands all do's and don'ts.  It was a pleasure to see her today.  We wish her the best but very sorry about her cat and the mutilating wound that she has but certainly hope that we can get her better to the point where she has a functional hand.     Satira Anis. Amedeo Plenty, M.D.     [facility]  D:  05/14/2012  T:  05/15/2012  Job:  [id number]

## 2012-06-04 ENCOUNTER — Ambulatory Visit: Payer: Medicare Other

## 2012-06-18 ENCOUNTER — Ambulatory Visit
Admission: RE | Admit: 2012-06-18 | Discharge: 2012-06-18 | Disposition: A | Discharge status: Home / Self Care | Payer: Medicare Other | Source: Ambulatory Visit | Attending: Obstetrics and Gynecology | Admitting: Obstetrics and Gynecology

## 2012-06-18 DIAGNOSIS — Z1231 Encounter for screening mammogram for malignant neoplasm of breast: Secondary | ICD-10-CM

## 2012-08-20 DIAGNOSIS — K1230 Oral mucositis (ulcerative), unspecified: Secondary | ICD-10-CM | POA: Insufficient documentation

## 2012-12-16 ENCOUNTER — Other Ambulatory Visit: Payer: Self-pay | Admitting: Orthopedic Surgery

## 2012-12-16 MED ORDER — DEXAMETHASONE SODIUM PHOSPHATE 10 MG/ML IJ SOLN: 10.0000 mg | Freq: Once | INTRAMUSCULAR | Status: DC

## 2012-12-16 MED ORDER — BUPIVACAINE LIPOSOME 1.3 % IJ SUSP: 20.0000 mL | Freq: Once | INTRAMUSCULAR | Status: DC

## 2012-12-16 NOTE — Progress Notes (Signed)
Preoperative surgical orders have been place into the Epic hospital system for Kristen Hansen on 12/16/2012, 5:21 PM  by Patrica Duel for surgery on 01/22/2013.  Preop revision Total Knee orders including Experal, IV Tylenol, and IV Decadron as long as there are no contraindications to the above medications. Avel Peace, PA-C

## 2013-01-16 ENCOUNTER — Encounter (HOSPITAL_COMMUNITY): Payer: Self-pay

## 2013-01-16 ENCOUNTER — Encounter (HOSPITAL_COMMUNITY): Payer: Self-pay | Admitting: Pharmacy Technician

## 2013-01-16 ENCOUNTER — Ambulatory Visit (HOSPITAL_COMMUNITY)
Admission: RE | Admit: 2013-01-16 | Discharge: 2013-01-16 | Disposition: A | Discharge status: Home / Self Care | Payer: Medicare Other | Source: Ambulatory Visit | Attending: Orthopedic Surgery | Admitting: Orthopedic Surgery

## 2013-01-16 ENCOUNTER — Encounter (HOSPITAL_COMMUNITY)
Admission: RE | Admit: 2013-01-16 | Discharge: 2013-01-16 | Disposition: A | Discharge status: Home / Self Care | Payer: Medicare Other | Source: Ambulatory Visit | Attending: Orthopedic Surgery | Admitting: Orthopedic Surgery

## 2013-01-16 DIAGNOSIS — R0989 Other specified symptoms and signs involving the circulatory and respiratory systems: Secondary | ICD-10-CM | POA: Insufficient documentation

## 2013-01-16 DIAGNOSIS — Z01818 Encounter for other preprocedural examination: Secondary | ICD-10-CM | POA: Insufficient documentation

## 2013-01-16 DIAGNOSIS — R05 Cough: Secondary | ICD-10-CM | POA: Insufficient documentation

## 2013-01-16 DIAGNOSIS — R9431 Abnormal electrocardiogram [ECG] [EKG]: Secondary | ICD-10-CM | POA: Insufficient documentation

## 2013-01-16 DIAGNOSIS — Z01812 Encounter for preprocedural laboratory examination: Secondary | ICD-10-CM | POA: Insufficient documentation

## 2013-01-16 DIAGNOSIS — R059 Cough, unspecified: Secondary | ICD-10-CM | POA: Insufficient documentation

## 2013-01-16 DIAGNOSIS — Z01810 Encounter for preprocedural cardiovascular examination: Secondary | ICD-10-CM | POA: Insufficient documentation

## 2013-01-16 HISTORY — DX: Headache: R51

## 2013-01-16 HISTORY — DX: Nausea with vomiting, unspecified: R11.2

## 2013-01-16 HISTORY — DX: Unspecified osteoarthritis, unspecified site: M19.90

## 2013-01-16 HISTORY — DX: Gastro-esophageal reflux disease without esophagitis: K21.9

## 2013-01-16 HISTORY — DX: Other specified postprocedural states: Z98.890

## 2013-01-16 LAB — COMPREHENSIVE METABOLIC PANEL
Albumin: 3.4 g/dL — ABNORMAL LOW (ref 3.5–5.2)
BUN: 13 mg/dL (ref 6–23)
Creatinine, Ser: 0.7 mg/dL (ref 0.50–1.10)
GFR calc Af Amer: >90 mL/min (ref >90)
Glucose, Bld: 87 mg/dL (ref 70–99)
Total Protein: 7.4 g/dL (ref 6.0–8.3)

## 2013-01-16 LAB — URINALYSIS, ROUTINE W REFLEX MICROSCOPIC
Bilirubin Urine: NEGATIVE
Hgb urine dipstick: NEGATIVE
Nitrite: NEGATIVE
Protein, ur: NEGATIVE mg/dL
Specific Gravity, Urine: 1.017 (ref 1.005–1.030)
Urobilinogen, UA: 0.2 mg/dL (ref 0.0–1.0)

## 2013-01-16 LAB — APTT: aPTT: 34 seconds (ref 24–37)

## 2013-01-16 LAB — CBC
HCT: 37.5 % (ref 36.0–46.0)
Hemoglobin: 12.5 g/dL (ref 12.0–15.0)
MCH: 30.5 pg (ref 26.0–34.0)
MCHC: 33.3 g/dL (ref 30.0–36.0)
MCV: 91.5 fL (ref 78.0–100.0)
Platelets: 260 10*3/uL (ref 150–400)
RBC: 4.1 MIL/uL (ref 3.87–5.11)
RDW: 12.8 % (ref 11.5–15.5)
WBC: 6.2 10*3/uL (ref 4.0–10.5)

## 2013-01-16 LAB — URINE MICROSCOPIC-ADD ON

## 2013-01-16 LAB — PROTIME-INR
INR: 1 (ref 0.00–1.49)
Prothrombin Time: 13.1 seconds (ref 11.6–15.2)

## 2013-01-16 LAB — SURGICAL PCR SCREEN
MRSA, PCR: NEGATIVE
Staphylococcus aureus: POSITIVE — AB

## 2013-01-16 NOTE — Patient Instructions (Addendum)
20 Kristen Hansen  01/16/2013   Your procedure is scheduled on: 01/22/13  Report to Darrin Nipper at [phone].  Call this number if you have problems the morning of surgery [phone]: [phone]   Remember:   Do not eat food or drink liquids After Midnight.     Take these medicines the morning of surgery with A SIP OF WATER: clonazepam, zetia, lamictal, claritin, prilosec, pravastatin, trileptal   Do not wear jewelry, make-up or nail polish.  Do not wear lotions, powders, or perfumes. You may wear deodorant.  Do not shave 48 hours prior to surgery. Men may shave face and neck.  Do not bring valuables to the hospital.  Contacts, dentures or bridgework may not be worn into surgery.  Leave suitcase in the car. After surgery it may be brought to your room.  For patients admitted to the hospital, checkout time is 11:00 AM the day of discharge.     Please read over the following fact sheets that you were given: MRSA Information, blood fact sheet, incentive spirometer fact sheet Birdie Sons, RN  pre op nurse call if needed [phone]    FAILURE TO FOLLOW THESE INSTRUCTIONS MAY RESULT IN CANCELLATION OF YOUR SURGERY   Patient Signature: ___________________________________________

## 2013-01-21 ENCOUNTER — Other Ambulatory Visit: Payer: Self-pay | Admitting: Orthopedic Surgery

## 2013-01-21 MED ORDER — CEFAZOLIN SODIUM-DEXTROSE 2-3 GM-% IV SOLR
2.0000 g | INTRAVENOUS | Status: AC
  Administered 2013-01-22: 2 g via INTRAVENOUS

## 2013-01-21 NOTE — H&P (Signed)
Kristen Hansen  DOB: 07/04/1946 Married / Language: English / Race: White Female  Date of Admission:  01/22/2013  Chief Complaint:  Right Knee Pain  History of Present Illness The patient is a 67 year old female who comes in for a preoperative History and Physical. The patient is scheduled for a right total knee arthroplasty (revision) to be performed by Dr. Frank V. Aluisio, MD at [facility] on 01/22/2013. The patient is a 67 year old female who presents for follow up of their knee. The patient is being followed for their right knee pain. They are now approximately 8 years out from uni-replacement. Symptoms reported today include: pain (constant). The following medication has been used for pain control: antiinflammatory medication (Meloxicam) and Tylenol. Note for "Follow-up Knee": Patient is scheduled for total knee arthroplasty 01/22/13. 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  She is now ready to change over to a total knee replacement. They have been treated conservatively in the past for the above stated problem and despite conservative measures, they continue to have progressive pain and severe functional limitations and dysfunction. They have failed non-operative management including home exercise, medications, and injections. It is felt that they would benefit from undergoing revision of uni to total joint replacement. Risks and benefits of the procedure have been discussed with the patient and they elect to proceed with surgery. There are no active contraindications to surgery such as ongoing infection or rapidly progressive neurological disease.   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 03/20/1994   Allergies PROVIGIL. 09/02/2008 BAND-AID ADHESIVE. 09/02/2008 MORPHINE. 09/02/2008   Family History Heart Disease. mother Osteoarthritis. father Heart disease in female family member before age  65 Osteoporosis. grandmother mothers side   Social History Illicit drug use. no Alcohol use. current drinker; drinks wine; only occasionally per week Marital status. married Tobacco use. Never smoker. never smoker Children. 0 Current work status. disabled Drug/Alcohol Rehab (Currently). no Drug/Alcohol Rehab (Previously). no Exercise. Exercises weekly; does running / walking Pain Contract. no Living situation. live with spouse Number of flights of stairs before winded. less than 1 Post-Surgical Plans. Plan is to go home.   Medication History Aspirin EC (81MG Tablet DR, Oral) Active. Azor (5-40MG Tablet, Oral) Active. ClonazePAM (0.5MG Tablet, Oral) Active. Enbrel SureClick (50MG/ML Solution, Subcutaneous) Active. Estradiol (0.1MG/24HR Patch Weekly, Transdermal) Active. Fish Oil Burp-Less ( Oral) Specific dose unknown - Active. Folic Acid (1MG Tablet, Oral) Active. LamoTRIgine (100MG Tablet, Oral) Active. Loratadine (10MG Tablet, Oral) Active. Meloxicam (15MG Tablet, Oral) Active. Nystatin (100000UNIT/ML Suspension, Mouth/Throat) Active. OXcarbazepine (300MG Tablet, Oral) Active. OxyCODONE HCl (5MG Capsule, Oral) Active. Pravastatin Sodium (40MG Tablet, Oral) Active. Senna Lax (8.6MG Tablet, Oral) Active. Triamcinolone Acetonide (Top) (0.1% Cream, External) Active. Urea (40% Lotion, External) Active. ValACYclovir HCl (1GM Tablet, Oral) Active. Zetia (10MG Tablet, Oral) Active. Omeprazole (10MG Capsule DR, Oral) Active.  Past Surgical History Colon Polyp Removal - Colonoscopy Total Hip Replacement. right Breast Reconstruction. bilateral Hysterectomy. complete (non-cancerous) Breast Biopsy. right Other Orthopaedic Surgery Tonsillectomy   Medical History Migraine Headache Hypercholesterolemia Osteoarthritis High blood pressure Rheumatoid Arthritis Other disease, cancer, significant illness Peripheral Neuropathy Allergic  Rhinitis Gastroesophageal Reflux Disease Hypothyroidism Irritable bowel syndrome Osteopenia Rosacea Menopause Trigeminal Neuralgia Sjogren's Syndrome   Review of Systems General:Not Present- Chills, Fever, Night Sweats, Fatigue, Weight Gain, Weight Loss and Memory Loss. Skin:Not Present- Hives, Itching, Rash, Eczema and Lesions. HEENT:Not Present- Tinnitus, Headache, Double Vision, Visual Loss, Hearing Loss and Dentures. Respiratory:Present- Shortness of   breath with exertion. Not Present- Shortness of breath at rest, Allergies, Coughing up blood and Chronic Cough. Cardiovascular:Not Present- Chest Pain, Racing/skipping heartbeats, Difficulty Breathing Lying Down, Murmur, Swelling and Palpitations. Gastrointestinal:Not Present- Bloody Stool, Heartburn, Abdominal Pain, Vomiting, Nausea, Constipation, Diarrhea, Difficulty Swallowing, Jaundice and Loss of appetitie. Female Genitourinary:Not Present- Blood in Urine, Urinary frequency, Weak urinary stream, Discharge, Flank Pain, Incontinence, Painful Urination, Urgency, Urinary Retention and Urinating at Night. Musculoskeletal:Present- Joint Pain, Back Pain and Morning Stiffness. Not Present- Muscle Weakness, Muscle Pain, Joint Swelling and Spasms. Neurological:Not Present- Tremor, Dizziness, Blackout spells, Paralysis, Difficulty with balance and Weakness. Psychiatric:Not Present- Insomnia.   Vitals Weight: 175 lb Height: 70 in Body Surface Area: 1.98 m Body Mass Index: 25.11 kg/m Pulse: 96 (Regular) Resp.: 14 (Unlabored) BP: 134/78 (Sitting, Right Arm, Standard)    Physical Exam The physical exam findings are as follows:  Note: Patient is a 67 year old female with continued knee pain.   General Mental Status - Alert, cooperative and good historian. General Appearance- pleasant. Not in acute distress. Orientation- Oriented X3. Build & Nutrition- Well nourished and Well developed.   Head and  Neck Head- normocephalic, atraumatic . Neck Global Assessment- supple. no bruit auscultated on the right and no bruit auscultated on the left.   Eye Pupil- Bilateral- Regular and Round. Motion- Bilateral- EOMI.   Chest and Lung Exam Auscultation: Breath sounds:- clear at anterior chest wall and - clear at posterior chest wall. Adventitious sounds:- No Adventitious sounds.   Cardiovascular Auscultation:Rhythm- Regular rate and rhythm. Heart Sounds- S1 WNL and S2 WNL. Murmurs & Other Heart Sounds:Auscultation of the heart reveals - No Murmurs.   Abdomen Palpation/Percussion:Tenderness- Abdomen is non-tender to palpation. Rigidity (guarding)- Abdomen is soft. Auscultation:Auscultation of the abdomen reveals - Bowel sounds normal.   Female Genitourinary Not done, not pertinent to present illness  Musculoskeletal  On exam, well-developed female, alert and oriented, in no apparent distress. Her right knee shows no effusion. She's got a slight valgus deformity. Her ROM is 5-125. She is tender medial greater than lateral but has lateral tenderness also. There is no instability noted.  RADIOGRAPHS: X-rays taken about 6 months ago showed the unicompartmental replacement on the right which has collapsed some. She has bone on bone change with lateral and patellofemoral.  Assessment & Plan Mech cmpl intrn orth dev/implant/graft (996.4)  Note: Plan is for a conversion of right unicompatmental knee, revision to full right total knee arthroplasty by Dr. Aluisio.  Plan is to go home.  Patient has had problems with electrolyte imbalances postop in the past (Na+ and K+).  PCP - Dr. Gallemore - Patient has been seen preoperatively and felt to be stable for surgery.  Signed electronically by DREW L , PA-C  

## 2013-01-22 ENCOUNTER — Encounter (HOSPITAL_COMMUNITY): Payer: Self-pay | Admitting: Anesthesiology

## 2013-01-22 ENCOUNTER — Encounter (HOSPITAL_COMMUNITY)
Admission: RE | Disposition: A | Discharge status: Home Health | Payer: Self-pay | Source: Ambulatory Visit | Attending: Orthopedic Surgery

## 2013-01-22 ENCOUNTER — Inpatient Hospital Stay (HOSPITAL_COMMUNITY)
Admission: RE | Admit: 2013-01-22 | Discharge: 2013-01-25 | DRG: 467 | Disposition: A | Discharge status: Home Health | Payer: Medicare Other | Source: Ambulatory Visit | Attending: Orthopedic Surgery | Admitting: Orthopedic Surgery

## 2013-01-22 ENCOUNTER — Inpatient Hospital Stay (HOSPITAL_COMMUNITY): Payer: Medicare Other | Admitting: Anesthesiology

## 2013-01-22 ENCOUNTER — Encounter (HOSPITAL_COMMUNITY): Payer: Self-pay

## 2013-01-22 DIAGNOSIS — T8484XA Pain due to internal orthopedic prosthetic devices, implants and grafts, initial encounter: Secondary | ICD-10-CM

## 2013-01-22 DIAGNOSIS — Z7982 Long term (current) use of aspirin: Secondary | ICD-10-CM

## 2013-01-22 DIAGNOSIS — M069 Rheumatoid arthritis, unspecified: Secondary | ICD-10-CM | POA: Diagnosis present

## 2013-01-22 DIAGNOSIS — E7800 Pure hypercholesterolemia, unspecified: Secondary | ICD-10-CM | POA: Diagnosis present

## 2013-01-22 DIAGNOSIS — Z96659 Presence of unspecified artificial knee joint: Secondary | ICD-10-CM

## 2013-01-22 DIAGNOSIS — E871 Hypo-osmolality and hyponatremia: Secondary | ICD-10-CM | POA: Diagnosis not present

## 2013-01-22 DIAGNOSIS — I10 Essential (primary) hypertension: Secondary | ICD-10-CM | POA: Diagnosis present

## 2013-01-22 DIAGNOSIS — T84099A Other mechanical complication of unspecified internal joint prosthesis, initial encounter: Principal | ICD-10-CM | POA: Diagnosis present

## 2013-01-22 DIAGNOSIS — Z79899 Other long term (current) drug therapy: Secondary | ICD-10-CM

## 2013-01-22 DIAGNOSIS — M1990 Unspecified osteoarthritis, unspecified site: Secondary | ICD-10-CM | POA: Diagnosis present

## 2013-01-22 DIAGNOSIS — Y831 Surgical operation with implant of artificial internal device as the cause of abnormal reaction of the patient, or of later complication, without mention of misadventure at the time of the procedure: Secondary | ICD-10-CM | POA: Diagnosis present

## 2013-01-22 DIAGNOSIS — D62 Acute posthemorrhagic anemia: Secondary | ICD-10-CM | POA: Diagnosis not present

## 2013-01-22 DIAGNOSIS — K219 Gastro-esophageal reflux disease without esophagitis: Secondary | ICD-10-CM | POA: Diagnosis present

## 2013-01-22 HISTORY — PX: TOTAL KNEE ARTHROPLASTY: SHX125

## 2013-01-22 LAB — TYPE AND SCREEN: Antibody Screen: NEGATIVE

## 2013-01-22 SURGERY — ARTHROPLASTY, KNEE, TOTAL
Anesthesia: Spinal | Site: Knee | Laterality: Right | Wound class: Clean

## 2013-01-22 MED ORDER — PHENOL 1.4 % MT LIQD: 1.0000 | OROMUCOSAL | Status: DC | PRN

## 2013-01-22 MED ORDER — FENTANYL CITRATE 0.05 MG/ML IJ SOLN
INTRAMUSCULAR | Status: DC | PRN
  Administered 2013-01-22: 50 ug via INTRAVENOUS

## 2013-01-22 MED ORDER — MUPIROCIN 2 % EX OINT
TOPICAL_OINTMENT | Freq: Once | CUTANEOUS | Status: AC
  Administered 2013-01-22: 06:00:00 via NASAL
  Filled 2013-01-22: qty 22

## 2013-01-22 MED ORDER — OXYCODONE HCL 5 MG PO TABS
5.0000 mg | ORAL_TABLET | ORAL | Status: DC | PRN
  Administered 2013-01-22 – 2013-01-23 (×8): 10 mg via ORAL
  Administered 2013-01-24 (×3): 5 mg via ORAL
  Administered 2013-01-24: 10 mg via ORAL
  Administered 2013-01-24: 5 mg via ORAL
  Administered 2013-01-24: 10 mg via ORAL
  Administered 2013-01-25 (×2): 5 mg via ORAL
  Filled 2013-01-22: qty 1
  Filled 2013-01-22 (×3): qty 2
  Filled 2013-01-22 (×2): qty 1
  Filled 2013-01-22 (×4): qty 2
  Filled 2013-01-22 (×3): qty 1
  Filled 2013-01-22 (×3): qty 2

## 2013-01-22 MED ORDER — BUPIVACAINE IN DEXTROSE 0.75-8.25 % IT SOLN
INTRATHECAL | Status: DC | PRN
  Administered 2013-01-22: 1.8 mL via INTRATHECAL

## 2013-01-22 MED ORDER — ACETAMINOPHEN 10 MG/ML IV SOLN: 1000.0000 mg | Freq: Once | INTRAVENOUS | Status: DC

## 2013-01-22 MED ORDER — 0.9 % SODIUM CHLORIDE (POUR BTL) OPTIME
TOPICAL | Status: DC | PRN
  Administered 2013-01-22: 1000 mL

## 2013-01-22 MED ORDER — AMLODIPINE-OLMESARTAN 5-40 MG PO TABS: 1.0000 | ORAL_TABLET | Freq: Every evening | ORAL | Status: DC

## 2013-01-22 MED ORDER — ENOXAPARIN SODIUM 30 MG/0.3ML ~~LOC~~ SOLN
30.0000 mg | Freq: Two times a day (BID) | SUBCUTANEOUS | Status: DC
  Administered 2013-01-23 – 2013-01-25 (×5): 30 mg via SUBCUTANEOUS
  Filled 2013-01-22 (×7): qty 0.3

## 2013-01-22 MED ORDER — CEFAZOLIN SODIUM 1-5 GM-% IV SOLN
1.0000 g | Freq: Four times a day (QID) | INTRAVENOUS | Status: AC
  Administered 2013-01-22 (×2): 1 g via INTRAVENOUS
  Filled 2013-01-22 (×2): qty 50

## 2013-01-22 MED ORDER — EZETIMIBE 10 MG PO TABS
10.0000 mg | ORAL_TABLET | Freq: Every day | ORAL | Status: DC
  Administered 2013-01-23 – 2013-01-25 (×3): 10 mg via ORAL
  Filled 2013-01-22 (×4): qty 1

## 2013-01-22 MED ORDER — FENTANYL CITRATE 0.05 MG/ML IJ SOLN
25.0000 ug | INTRAMUSCULAR | Status: DC | PRN
  Administered 2013-01-22: 50 ug via INTRAVENOUS

## 2013-01-22 MED ORDER — BUPIVACAINE LIPOSOME 1.3 % IJ SUSP
20.0000 mL | Freq: Once | INTRAMUSCULAR | Status: AC
  Administered 2013-01-22: 20 mL
  Filled 2013-01-22: qty 20

## 2013-01-22 MED ORDER — ONDANSETRON HCL 4 MG/2ML IJ SOLN
4.0000 mg | Freq: Four times a day (QID) | INTRAMUSCULAR | Status: DC | PRN
  Administered 2013-01-22: 4 mg via INTRAVENOUS
  Filled 2013-01-22 (×2): qty 2

## 2013-01-22 MED ORDER — METHOCARBAMOL 500 MG PO TABS
500.0000 mg | ORAL_TABLET | Freq: Four times a day (QID) | ORAL | Status: DC | PRN
  Administered 2013-01-22 – 2013-01-25 (×7): 500 mg via ORAL
  Filled 2013-01-22 (×7): qty 1

## 2013-01-22 MED ORDER — MEPERIDINE HCL 50 MG/ML IJ SOLN: 6.2500 mg | INTRAMUSCULAR | Status: DC | PRN

## 2013-01-22 MED ORDER — METOCLOPRAMIDE HCL 10 MG PO TABS: 5.0000 mg | ORAL_TABLET | Freq: Three times a day (TID) | ORAL | Status: DC | PRN

## 2013-01-22 MED ORDER — AMLODIPINE BESYLATE 5 MG PO TABS
5.0000 mg | ORAL_TABLET | Freq: Every day | ORAL | Status: DC
  Administered 2013-01-22 – 2013-01-24 (×3): 5 mg via ORAL
  Filled 2013-01-22 (×4): qty 1

## 2013-01-22 MED ORDER — OXCARBAZEPINE 300 MG PO TABS
300.0000 mg | ORAL_TABLET | Freq: Two times a day (BID) | ORAL | Status: DC
  Filled 2013-01-22: qty 1

## 2013-01-22 MED ORDER — LORATADINE 10 MG PO TABS
10.0000 mg | ORAL_TABLET | Freq: Every day | ORAL | Status: DC
  Administered 2013-01-23 – 2013-01-25 (×3): 10 mg via ORAL
  Filled 2013-01-22 (×3): qty 1

## 2013-01-22 MED ORDER — DOCUSATE SODIUM 100 MG PO CAPS
100.0000 mg | ORAL_CAPSULE | Freq: Two times a day (BID) | ORAL | Status: DC
  Administered 2013-01-22 – 2013-01-25 (×6): 100 mg via ORAL

## 2013-01-22 MED ORDER — ACETAMINOPHEN 650 MG RE SUPP: 650.0000 mg | Freq: Four times a day (QID) | RECTAL | Status: DC | PRN

## 2013-01-22 MED ORDER — HYDROMORPHONE HCL PF 1 MG/ML IJ SOLN
0.5000 mg | INTRAMUSCULAR | Status: DC | PRN
  Administered 2013-01-22: 1 mg via INTRAVENOUS
  Administered 2013-01-22: 0.5 mg via INTRAVENOUS
  Administered 2013-01-22: 1 mg via INTRAVENOUS
  Filled 2013-01-22 (×3): qty 1

## 2013-01-22 MED ORDER — HYDROMORPHONE HCL PF 1 MG/ML IJ SOLN
INTRAMUSCULAR | Status: AC
  Filled 2013-01-22: qty 1

## 2013-01-22 MED ORDER — LACTATED RINGERS IV SOLN
INTRAVENOUS | Status: DC | PRN
  Administered 2013-01-22: 07:00:00 via INTRAVENOUS

## 2013-01-22 MED ORDER — MENTHOL 3 MG MT LOZG: 1.0000 | LOZENGE | OROMUCOSAL | Status: DC | PRN

## 2013-01-22 MED ORDER — TRAMADOL HCL 50 MG PO TABS: 50.0000 mg | ORAL_TABLET | Freq: Four times a day (QID) | ORAL | Status: DC | PRN

## 2013-01-22 MED ORDER — ACETAMINOPHEN 325 MG PO TABS: 650.0000 mg | ORAL_TABLET | Freq: Four times a day (QID) | ORAL | Status: DC | PRN

## 2013-01-22 MED ORDER — PROMETHAZINE HCL 25 MG/ML IJ SOLN: 6.2500 mg | INTRAMUSCULAR | Status: DC | PRN

## 2013-01-22 MED ORDER — SODIUM CHLORIDE 0.9 % IV SOLN: INTRAVENOUS | Status: DC

## 2013-01-22 MED ORDER — LACTATED RINGERS IV SOLN: INTRAVENOUS | Status: DC

## 2013-01-22 MED ORDER — PROPOFOL 10 MG/ML IV EMUL
INTRAVENOUS | Status: DC | PRN
  Administered 2013-01-22: 100 ug/kg/min via INTRAVENOUS

## 2013-01-22 MED ORDER — STERILE WATER FOR IRRIGATION IR SOLN
Status: DC | PRN
  Administered 2013-01-22: 3000 mL

## 2013-01-22 MED ORDER — LAMOTRIGINE 100 MG PO TABS
100.0000 mg | ORAL_TABLET | Freq: Two times a day (BID) | ORAL | Status: DC
  Administered 2013-01-22 – 2013-01-25 (×6): 100 mg via ORAL
  Filled 2013-01-22 (×7): qty 1

## 2013-01-22 MED ORDER — DEXAMETHASONE 6 MG PO TABS
10.0000 mg | ORAL_TABLET | Freq: Once | ORAL | Status: AC
  Administered 2013-01-23: 10 mg via ORAL
  Filled 2013-01-22: qty 1

## 2013-01-22 MED ORDER — SENNOSIDES-DOCUSATE SODIUM 8.6-50 MG PO TABS
2.0000 | ORAL_TABLET | Freq: Every evening | ORAL | Status: DC
  Administered 2013-01-22 – 2013-01-24 (×3): 2 via ORAL
  Filled 2013-01-22 (×4): qty 2

## 2013-01-22 MED ORDER — ONDANSETRON HCL 4 MG/2ML IJ SOLN
INTRAMUSCULAR | Status: DC | PRN
  Administered 2013-01-22 (×2): 2 mg via INTRAVENOUS

## 2013-01-22 MED ORDER — POLYVINYL ALCOHOL 1.4 % OP SOLN
1.0000 [drp] | OPHTHALMIC | Status: DC | PRN
  Filled 2013-01-22: qty 15

## 2013-01-22 MED ORDER — SIMVASTATIN 20 MG PO TABS
20.0000 mg | ORAL_TABLET | Freq: Every day | ORAL | Status: DC
  Administered 2013-01-23 – 2013-01-24 (×2): 20 mg via ORAL
  Filled 2013-01-22 (×3): qty 1

## 2013-01-22 MED ORDER — BISACODYL 10 MG RE SUPP: 10.0000 mg | Freq: Every day | RECTAL | Status: DC | PRN

## 2013-01-22 MED ORDER — IMIPRAMINE HCL 25 MG PO TABS
25.0000 mg | ORAL_TABLET | Freq: Every day | ORAL | Status: DC
  Administered 2013-01-22 – 2013-01-24 (×3): 25 mg via ORAL
  Filled 2013-01-22 (×4): qty 1

## 2013-01-22 MED ORDER — HYPROMELLOSE (GONIOSCOPIC) 2.5 % OP SOLN
1.0000 [drp] | OPHTHALMIC | Status: DC | PRN
  Filled 2013-01-22: qty 15

## 2013-01-22 MED ORDER — ACETAMINOPHEN 10 MG/ML IV SOLN
1000.0000 mg | Freq: Four times a day (QID) | INTRAVENOUS | Status: AC
  Administered 2013-01-22 – 2013-01-23 (×4): 1000 mg via INTRAVENOUS
  Filled 2013-01-22 (×5): qty 100

## 2013-01-22 MED ORDER — RIVAROXABAN 10 MG PO TABS: 10.0000 mg | ORAL_TABLET | Freq: Every day | ORAL | Status: DC

## 2013-01-22 MED ORDER — CEVIMELINE HCL 30 MG PO CAPS
30.0000 mg | ORAL_CAPSULE | Freq: Three times a day (TID) | ORAL | Status: DC
  Administered 2013-01-22 – 2013-01-25 (×9): 30 mg via ORAL

## 2013-01-22 MED ORDER — DEXAMETHASONE SODIUM PHOSPHATE 10 MG/ML IJ SOLN: 10.0000 mg | Freq: Once | INTRAMUSCULAR | Status: AC

## 2013-01-22 MED ORDER — CLONAZEPAM 0.5 MG PO TABS
0.5000 mg | ORAL_TABLET | Freq: Every evening | ORAL | Status: DC
  Administered 2013-01-22 – 2013-01-24 (×3): 0.5 mg via ORAL
  Filled 2013-01-22 (×4): qty 1

## 2013-01-22 MED ORDER — OXCARBAZEPINE 300 MG PO TABS
300.0000 mg | ORAL_TABLET | Freq: Two times a day (BID) | ORAL | Status: DC
  Administered 2013-01-22 – 2013-01-25 (×7): 300 mg via ORAL
  Filled 2013-01-22 (×8): qty 1

## 2013-01-22 MED ORDER — POLYETHYLENE GLYCOL 3350 17 G PO PACK: 17.0000 g | PACK | Freq: Every day | ORAL | Status: DC | PRN

## 2013-01-22 MED ORDER — DEXTROSE-NACL 5-0.9 % IV SOLN
INTRAVENOUS | Status: DC
  Administered 2013-01-22 – 2013-01-24 (×2): via INTRAVENOUS

## 2013-01-22 MED ORDER — MIDAZOLAM HCL 5 MG/5ML IJ SOLN
INTRAMUSCULAR | Status: DC | PRN
  Administered 2013-01-22: 1 mg via INTRAVENOUS

## 2013-01-22 MED ORDER — METOCLOPRAMIDE HCL 5 MG/ML IJ SOLN: 5.0000 mg | Freq: Three times a day (TID) | INTRAMUSCULAR | Status: DC | PRN

## 2013-01-22 MED ORDER — ONDANSETRON HCL 4 MG PO TABS: 4.0000 mg | ORAL_TABLET | Freq: Four times a day (QID) | ORAL | Status: DC | PRN

## 2013-01-22 MED ORDER — AMLODIPINE BESYLATE 5 MG PO TABS
5.0000 mg | ORAL_TABLET | Freq: Once | ORAL | Status: DC
  Filled 2013-01-22: qty 1

## 2013-01-22 MED ORDER — OXCARBAZEPINE 300 MG PO TABS: 300.0000 mg | ORAL_TABLET | Freq: Three times a day (TID) | ORAL | Status: DC

## 2013-01-22 MED ORDER — IRBESARTAN 300 MG PO TABS
300.0000 mg | ORAL_TABLET | Freq: Every day | ORAL | Status: DC
  Administered 2013-01-22 – 2013-01-24 (×3): 300 mg via ORAL
  Filled 2013-01-22 (×4): qty 1

## 2013-01-22 MED ORDER — METHOCARBAMOL 100 MG/ML IJ SOLN
500.0000 mg | Freq: Four times a day (QID) | INTRAVENOUS | Status: DC | PRN
  Administered 2013-01-22: 500 mg via INTRAVENOUS
  Filled 2013-01-22 (×2): qty 5

## 2013-01-22 MED ORDER — FLEET ENEMA 7-19 GM/118ML RE ENEM: 1.0000 | ENEMA | Freq: Once | RECTAL | Status: AC | PRN

## 2013-01-22 MED ORDER — OXCARBAZEPINE 300 MG PO TABS
600.0000 mg | ORAL_TABLET | Freq: Every day | ORAL | Status: DC
  Administered 2013-01-22 – 2013-01-24 (×3): 600 mg via ORAL
  Filled 2013-01-22 (×4): qty 2

## 2013-01-22 MED ORDER — DIPHENHYDRAMINE HCL 12.5 MG/5ML PO ELIX
12.5000 mg | ORAL_SOLUTION | ORAL | Status: DC | PRN
  Administered 2013-01-22 – 2013-01-23 (×2): 12.5 mg via ORAL
  Filled 2013-01-22 (×2): qty 5

## 2013-01-22 MED ORDER — SODIUM CHLORIDE 0.9 % IJ SOLN
INTRAMUSCULAR | Status: DC | PRN
  Administered 2013-01-22: 50 mL via INTRAVENOUS

## 2013-01-22 SURGICAL SUPPLY — 58 items
BAG SPEC THK2 15X12 ZIP CLS (MISCELLANEOUS) ×1
BAG ZIPLOCK 12X15 (MISCELLANEOUS) ×2 IMPLANT
BANDAGE ELASTIC 6 VELCRO ST LF (GAUZE/BANDAGES/DRESSINGS) ×2 IMPLANT
BANDAGE ESMARK 6X9 LF (GAUZE/BANDAGES/DRESSINGS) ×1 IMPLANT
BLADE SAG 18X100X1.27 (BLADE) ×2 IMPLANT
BLADE SAW SGTL 11.0X1.19X90.0M (BLADE) ×2 IMPLANT
BNDG CMPR 9X6 STRL LF SNTH (GAUZE/BANDAGES/DRESSINGS) ×1
BNDG ESMARK 6X9 LF (GAUZE/BANDAGES/DRESSINGS) ×2
BOWL SMART MIX CTS (DISPOSABLE) ×2 IMPLANT
CAP UPCHARGE REVISION TRAY ×1 IMPLANT
CATH KIT ON-Q SILVERSOAK 5 (CATHETERS) ×1 IMPLANT
CATH KIT ON-Q SILVERSOAK 5IN (CATHETERS) ×2 IMPLANT
CEMENT HV SMART SET (Cement) ×3 IMPLANT
CLOTH BEACON ORANGE TIMEOUT ST (SAFETY) ×2 IMPLANT
CLSR STERI-STRIP ANTIMIC 1/2X4 (GAUZE/BANDAGES/DRESSINGS) ×1 IMPLANT
CUFF TOURN SGL QUICK 34 (TOURNIQUET CUFF) ×2
CUFF TRNQT CYL 34X4X40X1 (TOURNIQUET CUFF) ×1 IMPLANT
DRAPE EXTREMITY T 121X128X90 (DRAPE) ×2 IMPLANT
DRAPE POUCH INSTRU U-SHP 10X18 (DRAPES) ×2 IMPLANT
DRAPE U-SHAPE 47X51 STRL (DRAPES) ×2 IMPLANT
DRSG ADAPTIC 3X8 NADH LF (GAUZE/BANDAGES/DRESSINGS) ×2 IMPLANT
DRSG PAD ABDOMINAL 8X10 ST (GAUZE/BANDAGES/DRESSINGS) ×1 IMPLANT
DURAPREP 26ML APPLICATOR (WOUND CARE) ×2 IMPLANT
ELECT REM PT RETURN 9FT ADLT (ELECTROSURGICAL) ×2
ELECTRODE REM PT RTRN 9FT ADLT (ELECTROSURGICAL) ×1 IMPLANT
EVACUATOR 1/8 PVC DRAIN (DRAIN) ×2 IMPLANT
FACESHIELD LNG OPTICON STERILE (SAFETY) ×10 IMPLANT
GLOVE BIO SURGEON STRL SZ8 (GLOVE) ×2 IMPLANT
GLOVE BIOGEL PI IND STRL 8 (GLOVE) ×2 IMPLANT
GLOVE BIOGEL PI INDICATOR 8 (GLOVE) ×2
GLOVE ECLIPSE 8.0 STRL XLNG CF (GLOVE) ×2 IMPLANT
GLOVE SURG SS PI 6.5 STRL IVOR (GLOVE) ×4 IMPLANT
GOWN STRL NON-REIN LRG LVL3 (GOWN DISPOSABLE) ×4 IMPLANT
GOWN STRL REIN XL XLG (GOWN DISPOSABLE) ×2 IMPLANT
HANDPIECE INTERPULSE COAX TIP (DISPOSABLE) ×2
IMMOBILIZER KNEE 20 (SOFTGOODS) ×2
IMMOBILIZER KNEE 20 THIGH 36 (SOFTGOODS) ×1 IMPLANT
KIT BASIN OR (CUSTOM PROCEDURE TRAY) ×2 IMPLANT
MANIFOLD NEPTUNE II (INSTRUMENTS) ×2 IMPLANT
NEEDLE 27GAX1X1/2 (NEEDLE) ×2 IMPLANT
NS IRRIG 1000ML POUR BTL (IV SOLUTION) ×2 IMPLANT
PACK TOTAL JOINT (CUSTOM PROCEDURE TRAY) ×2 IMPLANT
PAD ABD 7.5X8 STRL (GAUZE/BANDAGES/DRESSINGS) ×2 IMPLANT
PADDING CAST COTTON 6X4 STRL (CAST SUPPLIES) ×4 IMPLANT
POSITIONER SURGICAL ARM (MISCELLANEOUS) ×2 IMPLANT
SET HNDPC FAN SPRY TIP SCT (DISPOSABLE) ×1 IMPLANT
SPONGE GAUZE 4X4 12PLY (GAUZE/BANDAGES/DRESSINGS) ×2 IMPLANT
STRIP CLOSURE SKIN 1/2X4 (GAUZE/BANDAGES/DRESSINGS) ×4 IMPLANT
SUCTION FRAZIER 12FR DISP (SUCTIONS) ×2 IMPLANT
SUT MNCRL AB 4-0 PS2 18 (SUTURE) ×2 IMPLANT
SUT VIC AB 2-0 CT1 27 (SUTURE) ×6
SUT VIC AB 2-0 CT1 TAPERPNT 27 (SUTURE) ×3 IMPLANT
SUT VLOC 180 0 24IN GS25 (SUTURE) ×2 IMPLANT
SYR 50ML LL SCALE MARK (SYRINGE) ×2 IMPLANT
TOWEL OR 17X26 10 PK STRL BLUE (TOWEL DISPOSABLE) ×4 IMPLANT
TRAY FOLEY CATH 14FRSI W/METER (CATHETERS) ×2 IMPLANT
WATER STERILE IRR 1500ML POUR (IV SOLUTION) ×2 IMPLANT
WRAP KNEE MAXI GEL POST OP (GAUZE/BANDAGES/DRESSINGS) ×4 IMPLANT

## 2013-01-22 NOTE — Anesthesia Postprocedure Evaluation (Signed)
  Anesthesia Post-op Note  Patient: Kristen Hansen  Procedure(s) Performed: Procedure(s) (LRB): TOTAL KNEE ARTHROPLASTY (Right)  Patient Location: PACU  Anesthesia Type: Spinal  Level of Consciousness: awake and alert   Airway and Oxygen Therapy: Patient Spontanous Breathing  Post-op Pain: mild  Post-op Assessment: Post-op Vital signs reviewed, Patient's Cardiovascular Status Stable, Respiratory Function Stable, Patent Airway and No signs of Nausea or vomiting  Last Vitals:  Filed Vitals:   01/22/13 0845  BP: 131/73  Pulse: 82  Temp:   Resp: 14    Post-op Vital Signs: stable   Complications: No apparent anesthesia complications

## 2013-01-22 NOTE — Addendum Note (Signed)
Addendum  created 01/22/13 1243 by Valeda Malm, CRNA   Modules edited:Anesthesia Medication Administration

## 2013-01-22 NOTE — Progress Notes (Signed)
Patient placed on holding time- waiting for room- vital signs changed to every 1 hour

## 2013-01-22 NOTE — Progress Notes (Signed)
Dr Lequita Halt aware that patient was given 1st dose of mupirocin begun this am

## 2013-01-22 NOTE — H&P (View-Only) (Signed)
Kristen Hansen  DOB: Aug 26, 1946 Married / Language: English / Race: White Female  Date of Admission:  01/22/2013  Chief Complaint:  Right Knee Pain  History of Present Illness The patient is a 67 year old female who comes in for a preoperative History and Physical. The patient is scheduled for a right total knee arthroplasty (revision) to be performed by Dr. Gus Rankin. Aluisio, MD at [facility] on 01/22/2013. The patient is a 67 year old female who presents for follow up of their knee. The patient is being followed for their right knee pain. They are now approximately 8 years out from uni-replacement. Symptoms reported today include: pain (constant). The following medication has been used for pain control: antiinflammatory medication (Meloxicam) and Tylenol. Note for "Follow-up Knee": Patient is scheduled for total knee arthroplasty 01/22/13. 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  She is now ready to change over to a total knee replacement. They have been treated conservatively in the past for the above stated problem and despite conservative measures, they continue to have progressive pain and severe functional limitations and dysfunction. They have failed non-operative management including home exercise, medications, and injections. It is felt that they would benefit from undergoing revision of uni to total joint replacement. Risks and benefits of the procedure have been discussed with the patient and they elect to proceed with surgery. There are no active contraindications to surgery such as ongoing infection or rapidly progressive neurological disease.   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 03/20/1994   Allergies PROVIGIL. 09/02/2008 BAND-AID ADHESIVE. 09/02/2008 MORPHINE. 09/02/2008   Family History Heart Disease. mother Osteoarthritis. father Heart disease in female family member before age  72 Osteoporosis. grandmother mothers side   Social History Illicit drug use. no Alcohol use. current drinker; drinks wine; only occasionally per week Marital status. married Tobacco use. Never smoker. never smoker Children. 0 Current work status. disabled Drug/Alcohol Rehab (Currently). no Drug/Alcohol Rehab (Previously). no Exercise. Exercises weekly; does running / walking Pain Contract. no Living situation. live with spouse Number of flights of stairs before winded. less than 1 Post-Surgical Plans. Plan is to go home.   Medication History Aspirin EC (81MG  Tablet DR, Oral) Active. Azor (5-40MG  Tablet, Oral) Active. ClonazePAM (0.5MG  Tablet, Oral) Active. Enbrel SureClick (50MG /ML Solution, Subcutaneous) Active. Estradiol (0.1MG /24HR Patch Weekly, Transdermal) Active. Fish Oil Burp-Less ( Oral) Specific dose unknown - Active. Folic Acid (1MG  Tablet, Oral) Active. LamoTRIgine (100MG  Tablet, Oral) Active. Loratadine (10MG  Tablet, Oral) Active. Meloxicam (15MG  Tablet, Oral) Active. Nystatin (100000 UNIT/ML Suspension, Mouth/Throat) Active. OXcarbazepine (300MG  Tablet, Oral) Active. OxyCODONE HCl (5MG  Capsule, Oral) Active. Pravastatin Sodium (40MG  Tablet, Oral) Active. Senna Lax (8.6MG  Tablet, Oral) Active. Triamcinolone Acetonide (Top) (0.1% Cream, External) Active. Urea (40% Lotion, External) Active. ValACYclovir HCl (1GM Tablet, Oral) Active. Zetia (10MG  Tablet, Oral) Active. Omeprazole (10MG  Capsule DR, Oral) Active.  Past Surgical History Colon Polyp Removal - Colonoscopy Total Hip Replacement. right Breast Reconstruction. bilateral Hysterectomy. complete (non-cancerous) Breast Biopsy. right Other Orthopaedic Surgery Tonsillectomy   Medical History Migraine Headache Hypercholesterolemia Osteoarthritis High blood pressure Rheumatoid Arthritis Other disease, cancer, significant illness Peripheral Neuropathy Allergic  Rhinitis Gastroesophageal Reflux Disease Hypothyroidism Irritable bowel syndrome Osteopenia Rosacea Menopause Trigeminal Neuralgia Sjogren's Syndrome   Review of Systems General:Not Present- Chills, Fever, Night Sweats, Fatigue, Weight Gain, Weight Loss and Memory Loss. Skin:Not Present- Hives, Itching, Rash, Eczema and Lesions. HEENT:Not Present- Tinnitus, Headache, Double Vision, Visual Loss, Hearing Loss and Dentures. Respiratory:Present- Shortness of  breath with exertion. Not Present- Shortness of breath at rest, Allergies, Coughing up blood and Chronic Cough. Cardiovascular:Not Present- Chest Pain, Racing/skipping heartbeats, Difficulty Breathing Lying Down, Murmur, Swelling and Palpitations. Gastrointestinal:Not Present- Bloody Stool, Heartburn, Abdominal Pain, Vomiting, Nausea, Constipation, Diarrhea, Difficulty Swallowing, Jaundice and Loss of appetitie. Female Genitourinary:Not Present- Blood in Urine, Urinary frequency, Weak urinary stream, Discharge, Flank Pain, Incontinence, Painful Urination, Urgency, Urinary Retention and Urinating at Night. Musculoskeletal:Present- Joint Pain, Back Pain and Morning Stiffness. Not Present- Muscle Weakness, Muscle Pain, Joint Swelling and Spasms. Neurological:Not Present- Tremor, Dizziness, Blackout spells, Paralysis, Difficulty with balance and Weakness. Psychiatric:Not Present- Insomnia.   Vitals Weight: 175 lb Height: 70 in Body Surface Area: 1.98 m Body Mass Index: 25.11 kg/m Pulse: 96 (Regular) Resp.: 14 (Unlabored) BP: 134/78 (Sitting, Right Arm, Standard)    Physical Exam The physical exam findings are as follows:  Note: Patient is a 67 year old female with continued knee pain.   General Mental Status - Alert, cooperative and good historian. General Appearance- pleasant. Not in acute distress. Orientation- Oriented X3. Build & Nutrition- Well nourished and Well developed.   Head and  Neck Head- normocephalic, atraumatic . Neck Global Assessment- supple. no bruit auscultated on the right and no bruit auscultated on the left.   Eye Pupil- Bilateral- Regular and Round. Motion- Bilateral- EOMI.   Chest and Lung Exam Auscultation: Breath sounds:- clear at anterior chest wall and - clear at posterior chest wall. Adventitious sounds:- No Adventitious sounds.   Cardiovascular Auscultation:Rhythm- Regular rate and rhythm. Heart Sounds- S1 WNL and S2 WNL. Murmurs & Other Heart Sounds:Auscultation of the heart reveals - No Murmurs.   Abdomen Palpation/Percussion:Tenderness- Abdomen is non-tender to palpation. Rigidity (guarding)- Abdomen is soft. Auscultation:Auscultation of the abdomen reveals - Bowel sounds normal.   Female Genitourinary Not done, not pertinent to present illness  Musculoskeletal  On exam, well-developed female, alert and oriented, in no apparent distress. Her right knee shows no effusion. She's got a slight valgus deformity. Her ROM is 5-125. She is tender medial greater than lateral but has lateral tenderness also. There is no instability noted.  RADIOGRAPHS: X-rays taken about 6 months ago showed the unicompartmental replacement on the right which has collapsed some. She has bone on bone change with lateral and patellofemoral.  Assessment & Plan Mech cmpl intrn orth dev/implant/graft (996.4)  Note: Plan is for a conversion of right unicompatmental knee, revision to full right total knee arthroplasty by Dr. Lequita Halt.  Plan is to go home.  Patient has had problems with electrolyte imbalances postop in the past (Na+ and K+).  PCP - Dr. Brynda Rim - Patient has been seen preoperatively and felt to be stable for surgery.  Signed electronically by Roberts Gaudy, PA-C

## 2013-01-22 NOTE — Op Note (Signed)
NAME:  Truex, Dalyah                 ACCOUNT NO.:  [id number]  MEDICAL RECORD NO.:  [id number]  LOCATION:  [facility]                         FACILITY:  [facility]  PHYSICIAN:  Ollen Gross, M.D.    DATE OF BIRTH:  April 14, 1946  DATE OF PROCEDURE:  01/22/2013 DATE OF DISCHARGE:                              OPERATIVE REPORT   PREOPERATIVE DIAGNOSIS:  Failed right knee unicompartmental replacement.  POSTOPERATIVE DIAGNOSIS:  Failed right knee unicompartmental replacement.  PROCEDURE:  Revision of right knee unicompartmental replacement to total knee arthroplasty.  SURGEON:  Ollen Gross, M.D.  ASSISTANT:  Jaquelyn Bitter. Chabon, P.A.  ANESTHESIA:  General.  ESTIMATED BLOOD LOSS:  Minimal.  DRAINS:  Hemovac x1.  TOURNIQUET TIME:  At 43 minutes at 300 mmHg.  COMPLICATIONS:  None.  CONDITION:  Stable to recovery.  BRIEF CLINICAL NOTE:  Ms. Dena is a 68 year old female, had a medial unicompartmental replacement done in right knee several years ago.  Past few years, she has noticed increased pain.  Radiographs have shown progression of arthritis of patellofemoral lateral compartment.  She has failed nonoperative management and presents now for revision to a total knee arthroplasty.  PROCEDURE IN DETAIL:  After successful administration of general anesthetic, a tourniquet was placed on her right thigh and right lower extremity, prepped and draped in the usual sterile fashion.  Extremities wrapped in Esmarch, knee flexed, and tourniquet inflated to 300 mmHg.  A midline incision was made with a 10 blade through subcutaneous tissue to the level of the extensor mechanism.  A fresh blade was used to make a medial parapatellar arthrotomy.  The soft tissue over the proximal medial tibia was subperiosteally elevated to the joint line with a knife and into the semimembranosus bursa with a Cobb elevator.  Soft tissue laterally was elevated with attention being paid to avoid the patellar tendon on  tibial tubercle.  Patella was everted, knee flexed 90 degrees and there was minimal ACL left and it was resected and the PCL was then resected also.  The medial femoral component was overgrown with osteophytes.  They started in the intercondylar area and growing over the superior aspect of the component.  About half the component was covered with osteophyte.  I removed the osteophytes and then used an osteotome to disrupt the interface between the component and bone and removed it with minimal bone loss.  We then used a drill to create a starting hole in the distal femur.  Canal was thoroughly irrigated.  The 5-degree right valgus alignment guide was placed.  Distal femoral cutting block was pinned to remove 10 mm off the distal femur. Resection was made with an oscillating saw.  The tibia subluxed forward and the lateral meniscus removed. Extramedullary tibial alignment guide was placed __________ proximally at the medial aspect of the tibial tubercle and distally along the second metatarsal axis and tibial crest.  Block was pinned so as the resection would come just below the medial or polyethylene tibial component.  The resection was made with an oscillating saw.  We had gotten under the component.  There was healthy-appearing cancellous bone present.  The size 3 was the most appropriate size for  this and we prepared the proximal tibia with the modular drill and MBT revision and then did a keel punch.  Had excellent rotational stability.  Femoral sizing guide placed and size 4 narrow was most appropriate.  The rotation was marked at the epicondylar axis.  It was confirmed by creating rectangular flexion gap at 90 degrees.  Block was pinned in this rotation.  The anterior and posterior chamfer cuts were made. Intercondylar block was placed and that cut was made.  Trial size 4 narrow posterior stabilized femur was placed.  A 12.5 mm posterior stabilized rotating platform insert was  placed and it was a little bit of varus-valgus, placed with a 15, which allowed for full extension with excellent varus-valgus and anterior-posterior balance throughout full range of motion.  Patella was everted, thickness measured 21 mm. Freehand resection taken to 12 mm, 35 template was placed, lug holes were drilled, trial patella was placed and it tracks normally.  Osteophytes removed of the posterior femur with the trial in place.  All trials were removed and the cut bone surfaces prepared with pulsatile lavage.  Cement was mixed and once ready for implantation the size 3 MBT revision tray, size 4 narrow posterior stabilized femur and 35 patella were cemented into place.  The patella was held with a clamp.  Trial 15 inserts placed, knee held in full extension.  All extruded cement removed.  Once the cement was fully hardened, then the permanent 15 mm posterior stabilized rotating platform insert was placed in tibial tray. Total of 20 mL of Exparel mixed with 50 mL of saline.  I then injected into the extensor mechanism, periosteum of the femur, the subcu tissues and ligaments.  The wound was copiously irrigated with saline solution and the arthrotomy closed over Hemovac drain with #1 running V-Loc suture.  Flexion against gravity to 135 degrees.  The tourniquet was released for a total time of 43 minutes.  Minimal bleeding was encountered and that which was encountered was stopped with electrocautery.  Subcu was closed with interrupted 2-0 Vicryl and subcuticular running 4-0 Monocryl.  Incisions cleaned and dried and Steri-Strips and a bulky sterile dressing applied.  Drains hooked to suction.  She was placed into a knee immobilizer, awakened, and transported to recovery in stable condition.  Please note the surgical assistant was a medical necessity for this procedure in order to perform it in a safe and expeditious manner. Assistant was necessary for retraction of vital  neurovascular structures and for proper exposure of the bony surfaces to allow for appropriate placement of the prosthesis.     Ollen Gross, M.D.     FA/MEDQ  D:  01/22/2013  T:  01/22/2013  Job:  [id number]

## 2013-01-22 NOTE — Plan of Care (Signed)
Problem: Consults Goal: Diagnosis- Total Joint Replacement Right total knee     

## 2013-01-22 NOTE — Addendum Note (Signed)
Addendum  created 01/22/13 1308 by Valeda Malm, CRNA   Modules edited:Anesthesia Medication Administration

## 2013-01-22 NOTE — Progress Notes (Signed)
Utilization review completed.  

## 2013-01-22 NOTE — Progress Notes (Signed)
Assessment of patient changed to every 1 hour.

## 2013-01-22 NOTE — Anesthesia Procedure Notes (Addendum)
Spinal  Patient location during procedure: OR Staffing Anesthesiologist: Phillips Grout Performed by: anesthesiologist  Preanesthetic Checklist Completed: patient identified, site marked, surgical consent, pre-op evaluation, timeout performed, IV checked, risks and benefits discussed and monitors and equipment checked Spinal Block Patient position: sitting Prep: Betadine Patient monitoring: heart rate, continuous pulse ox and blood pressure Approach: right paramedian Injection technique: single-shot Needle Needle type: Spinocan  Needle gauge: 22 G Needle length: 9 cm Additional Notes Expiration date of kit checked and confirmed. Patient tolerated procedure well, without complications.

## 2013-01-22 NOTE — Evaluation (Signed)
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  Tolerated sitting EOB x approx 10 mins, however was unable to stand due to increased dizziness.  Assisted pt back to supine position.  Pt will benefit from skilled PT in acute venue to address deficits.  PT recommends HHPT for follow up at D/C to maximize pts safety and functional mobiltiy.     PT Assessment  Patient needs continued PT services    Follow Up Recommendations  Home health PT    Does the patient have the potential to tolerate intense rehabilitation      Barriers to Discharge None      Equipment Recommendations  None recommended by PT    Recommendations for Other Services OT consult   Frequency 7X/week    Precautions / Restrictions Precautions Precautions: Knee Required Braces or Orthoses: Knee Immobilizer - Right Knee Immobilizer - Right: Discontinue once straight leg raise with < 10 degree lag Restrictions Weight Bearing Restrictions: No Other Position/Activity Restrictions: WBAT   Pertinent Vitals/Pain 5/10 pain, ice packs applied      Mobility  Bed Mobility Bed Mobility: Supine to Sit;Sitting - Scoot to Edge of Bed;Sit to Supine Supine to Sit: 4: Min assist;HOB elevated;With rails Sitting - Scoot to Edge of Bed: 4: Min assist Sit to Supine: 4: Min assist;3: Mod assist;HOB flat Details for Bed Mobility Assistance: Assist for RLE out of bed with cues for hand placement to self assist.  Also requires assist for BLEs into bed due to dizziness with cues for technique and safety.   Transfers Transfers: Not assessed    Shoulder Instructions     Exercises     PT Diagnosis: Difficulty walking;Generalized weakness;Acute pain  PT  Problem List: Decreased strength;Decreased range of motion;Decreased balance;Decreased activity tolerance;Decreased mobility;Decreased knowledge of use of DME;Decreased knowledge of precautions;Pain PT Treatment Interventions: DME instruction;Gait training;Stair training;Functional mobility training;Therapeutic activities;Therapeutic exercise;Balance training;Patient/family education   PT Goals Acute Rehab PT Goals PT Goal Formulation: With patient Time For Goal Achievement: 01/27/13 Potential to Achieve Goals: Good Pt will go Supine/Side to Sit: with supervision PT Goal: Supine/Side to Sit - Progress: Goal set today Pt will go Sit to Supine/Side: with supervision PT Goal: Sit to Supine/Side - Progress: Goal set today Pt will go Sit to Stand: with supervision PT Goal: Sit to Stand - Progress: Goal set today Pt will go Stand to Sit: with supervision PT Goal: Stand to Sit - Progress: Goal set today Pt will Ambulate: 51 - 150 feet;with supervision;with least restrictive assistive device PT Goal: Ambulate - Progress: Goal set today Pt will Go Up / Down Stairs: 6-9 stairs;with min assist;with least restrictive assistive device PT Goal: Up/Down Stairs - Progress: Goal set today  Visit Information  Last PT Received On: 01/22/13 Assistance Needed: +1    Subjective Data  Subjective: I'll try Patient Stated Goal: to get on with this.    Prior Functioning  Home Living Lives With: Spouse Available Help at Discharge: Family;Available PRN/intermittently Type of Home: House Home Access: Stairs to enter [employer] of Steps: 7-8 Entrance Stairs-Rails: Right;Left;Can reach both Home Layout: Two level;Able to live on main level with bedroom/bathroom Bathroom Shower/Tub: Health visitor: Handicapped height Home Adaptive Equipment: Built-in shower seat;Bedside commode/3-in-1;Walker - rolling;Straight cane;Shower  chair without back;Tub transfer bench Prior  Function Level of Independence: Independent Able to Take Stairs?: Yes Driving: Yes Vocation: Retired Musician: No difficulties    Cognition  Overall Cognitive Status: Appears within functional limits for tasks assessed/performed Arousal/Alertness: Awake/alert Orientation Level: Appears intact for tasks assessed Behavior During Session: [facility] for tasks performed    Extremity/Trunk Assessment Right Lower Extremity Assessment RLE ROM/Strength/Tone: Deficits RLE ROM/Strength/Tone Deficits: ankle motions WFL, unable to perform SLR without assist.  RLE Sensation: WFL - Light Touch Left Lower Extremity Assessment LLE ROM/Strength/Tone: WFL for tasks assessed LLE Sensation: WFL - Light Touch Trunk Assessment Trunk Assessment: Normal   Balance Balance Balance Assessed: Yes Static Sitting Balance Static Sitting - Balance Support: Bilateral upper extremity supported;Feet supported Static Sitting - Level of Assistance: 5: Stand by assistance Static Sitting - Comment/# of Minutes: Pt able to sit EOB x approx 10 mins at stand by assist for safety.  Pt c/o dizziness, however could tolerate sitting, then stated that it was getting worse.  Assisted pt back to supine position and Trendelenburg bed position until pt stated she felt improved.   End of Session PT - End of Session Equipment Utilized During Treatment: Right knee immobilizer Activity Tolerance: Patient limited by pain;Other (comment) (dizziness. ) Patient left: in bed;with call bell/phone within reach Nurse Communication: Mobility status CPM Right Knee CPM Right Knee: Off  GP     Vista Deck 01/22/2013, 4:17 PM

## 2013-01-22 NOTE — Transfer of Care (Signed)
Immediate Anesthesia Transfer of Care Note  Patient: Kristen Hansen  Procedure(s) Performed: Procedure(s) (LRB) with comments: TOTAL KNEE ARTHROPLASTY (Right) - Revision of a Right Uni Knee to a Total Knee Arthroplasty  Patient Location: PACU  Anesthesia Type:Regional  Level of Consciousness: awake, alert , oriented and patient cooperative  Airway & Oxygen Therapy: Patient Spontanous Breathing and Patient connected to face mask oxygen  Post-op Assessment: Report given to PACU RN and Post -op Vital signs reviewed and stable  Post vital signs: Reviewed and stable  Complications: No apparent anesthesia complications

## 2013-01-22 NOTE — Interval H&P Note (Signed)
History and Physical Interval Note:  01/22/2013 7:00 AM  Kristen Hansen  has presented today for surgery, with the diagnosis of Failed Right Knee Uni Replacement  The various methods of treatment have been discussed with the patient and family. After consideration of risks, benefits and other options for treatment, the patient has consented to  Procedure(s) (LRB) with comments: TOTAL KNEE ARTHROPLASTY (Right) - Revision of a Right Uni Knee to a Total Knee Arthroplasty as a surgical intervention .  The patient's history has been reviewed, patient examined, no change in status, stable for surgery.  I have reviewed the patient's chart and labs.  Questions were answered to the patient's satisfaction.     Loanne Drilling

## 2013-01-22 NOTE — Brief Op Note (Signed)
01/22/2013  8:16 AM  PATIENT:  Kristen Hansen  67 y.o. female  PRE-OPERATIVE DIAGNOSIS:  failed right uni knee  POST-OPERATIVE DIAGNOSIS:  failed right uni knee  PROCEDURE:  Procedure(s) (LRB) with comments: TOTAL KNEE ARTHROPLASTY (Right) - Revision of a Right Uni Knee to a Total Knee Arthroplasty  SURGEON:  Surgeon(s) and Role:    * Loanne Drilling, MD - Primary  PHYSICIAN ASSISTANT:   ASSISTANTS: Leilani Able, PA-C   ANESTHESIA:   general  EBL:  Total I/O In: -  Out: 175 [Urine:150; Blood:25]  BLOOD ADMINISTERED:none  DRAINS: (Medium) Hemovact drain(s) in the right knee with  Suction Open   LOCAL MEDICATIONS USED:  OTHER Exparel  COUNTS:  YES  TOURNIQUET:  43 minutes @ 300 mm HG  DICTATION: .Other Dictation: Dictation Number [id number]  PLAN OF CARE: Admit to inpatient   PATIENT DISPOSITION:  PACU - hemodynamically stable.

## 2013-01-22 NOTE — Anesthesia Preprocedure Evaluation (Addendum)
Anesthesia Evaluation  Patient identified by MRN, date of birth, ID band Patient awake    Reviewed: Allergy & Precautions, H&P , NPO status , Patient's Chart, lab work & pertinent test results  History of Anesthesia Complications (+) PONV  Airway Mallampati: III TM Distance: >3 FB Neck ROM: Full    Dental No notable dental hx.    Pulmonary neg pulmonary ROS,  breath sounds clear to auscultation  Pulmonary exam normal       Cardiovascular hypertension, Pt. on medications negative cardio ROS  Rhythm:Regular Rate:Normal     Neuro/Psych negative neurological ROS  negative psych ROS   GI/Hepatic negative GI ROS, Neg liver ROS,   Endo/Other  negative endocrine ROS  Renal/GU negative Renal ROS  negative genitourinary   Musculoskeletal negative musculoskeletal ROS (+) Arthritis -, Rheumatoid disorders,    Abdominal   Peds negative pediatric ROS (+)  Hematology negative hematology ROS (+)   Anesthesia Other Findings   Reproductive/Obstetrics negative OB ROS                          Anesthesia Physical Anesthesia Plan  ASA: II  Anesthesia Plan: Spinal   Post-op Pain Management:    Induction:   Airway Management Planned: Simple Face Mask  Additional Equipment:   Intra-op Plan:   Post-operative Plan:   Informed Consent: I have reviewed the patients History and Physical, chart, labs and discussed the procedure including the risks, benefits and alternatives for the proposed anesthesia with the patient or authorized representative who has indicated his/her understanding and acceptance.   Dental advisory given  Plan Discussed with: CRNA  Anesthesia Plan Comments:         Anesthesia Quick Evaluation

## 2013-01-23 ENCOUNTER — Encounter (HOSPITAL_COMMUNITY): Payer: Self-pay | Admitting: Orthopedic Surgery

## 2013-01-23 DIAGNOSIS — E871 Hypo-osmolality and hyponatremia: Secondary | ICD-10-CM

## 2013-01-23 DIAGNOSIS — D62 Acute posthemorrhagic anemia: Secondary | ICD-10-CM

## 2013-01-23 LAB — CBC
Platelets: 218 10*3/uL (ref 150–400)
RDW: 12.8 % (ref 11.5–15.5)
WBC: 9.3 10*3/uL (ref 4.0–10.5)

## 2013-01-23 LAB — BASIC METABOLIC PANEL
Chloride: 95 mEq/L — ABNORMAL LOW (ref 96–112)
Creatinine, Ser: 0.6 mg/dL (ref 0.50–1.10)
GFR calc Af Amer: >90 mL/min (ref >90)
Potassium: 3.5 mEq/L (ref 3.5–5.1)

## 2013-01-23 MED ORDER — NON FORMULARY: 20.0000 mg | Freq: Two times a day (BID) | Status: DC

## 2013-01-23 MED ORDER — METOPROLOL TARTRATE 50 MG PO TABS
50.0000 mg | ORAL_TABLET | Freq: Once | ORAL | Status: AC
  Administered 2013-01-23: 50 mg via ORAL
  Filled 2013-01-23: qty 1

## 2013-01-23 MED ORDER — OMEPRAZOLE 20 MG PO CPDR
20.0000 mg | DELAYED_RELEASE_CAPSULE | Freq: Two times a day (BID) | ORAL | Status: DC
  Administered 2013-01-23 – 2013-01-25 (×5): 20 mg via ORAL
  Filled 2013-01-23 (×8): qty 1

## 2013-01-23 MED ORDER — ENALAPRILAT 1.25 MG/ML IV SOLN
0.6250 mg | Freq: Once | INTRAVENOUS | Status: AC
  Administered 2013-01-23: 0.625 mg via INTRAVENOUS
  Filled 2013-01-23: qty 0.5

## 2013-01-23 MED ORDER — ENALAPRILAT 1.25 MG/ML IV SOLN
0.6250 mg | Freq: Once | INTRAVENOUS | Status: AC | PRN
  Filled 2013-01-23: qty 0.5

## 2013-01-23 NOTE — Evaluation (Signed)
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 Pt states she has all DME and AE from previous sx and is very familiar with all of it. Pt will also have prn A at home. Pt presents with no further OT needs at this time.    OT Assessment  Patient does not need any further OT services    Follow Up Recommendations  No OT follow up    Barriers to Discharge      Equipment Recommendations  None recommended by OT    Recommendations for Other Services    Frequency       Precautions / Restrictions Precautions Precautions: Knee Required Braces or Orthoses: Knee Immobilizer - Right Knee Immobilizer - Right: Discontinue once straight leg raise with < 10 degree lag Restrictions Weight Bearing Restrictions: No RLE Weight Bearing: Weight bearing as tolerated   Pertinent Vitals/Pain Pt reported 7/10 pain R knee. Repositioned and cold applied.    ADL  Grooming: Min guard Where Assessed - Grooming: Supported standing Upper Body Bathing: Set up Where Assessed - Upper Body Bathing: Unsupported sitting Lower Body Bathing: Minimal assistance Where Assessed - Lower Body Bathing: Supported sit to stand Upper Body Dressing: Set up Where Assessed - Upper Body Dressing: Unsupported sitting Lower Body Dressing: Moderate assistance Where Assessed - Lower Body Dressing: Supported sit to Pharmacist, hospital: Minimal Dentist Method: Sit to Barista: Other (comment) (to recliner) Toileting - Clothing Manipulation and Hygiene: Minimal assistance Where Assessed - Toileting Clothing Manipulation and Hygiene: Sit to stand from  3-in-1 or toilet Equipment Used: Rolling walker    OT Diagnosis:    OT Problem List:   OT Treatment Interventions:     OT Goals    Visit Information  Last OT Received On: 01/23/13 Assistance Needed: +1 PT/OT Co-Evaluation/Treatment: Yes    Subjective Data  Subjective: My house is completely handicapped accessible. Patient Stated Goal: Not asked.   Prior Functioning     Home Living Lives With: Spouse Available Help at Discharge: Family;Available PRN/intermittently Type of Home: House Home Access: Stairs to enter Entrance Stairs-Rails: Right;Left;Can reach both Home Layout: Two level;Able to live on main level with bedroom/bathroom Bathroom Shower/Tub: Walk-in shower without a lip Bathroom Toilet: Handicapped height Home Adaptive Equipment: Built-in shower seat;Tub transfer bench;Bedside commode/3-in-1;Walker - rolling;Straight cane;Shower chair without back;Grab bars around toilet;Grab bars in shower Prior Function Level of Independence: Independent Able to Take Stairs?: Yes Driving: Yes Vocation: Retired Musician: No difficulties Dominant Hand: Right         Vision/Perception     Cognition  Overall Cognitive Status: Appears within functional limits for tasks assessed/performed Arousal/Alertness: Awake/alert Orientation Level: Appears intact for tasks assessed Behavior During Session: [facility] for tasks performed    Extremity/Trunk Assessment Right Upper Extremity Assessment RUE ROM/Strength/Tone: [facility] for tasks assessed Left Upper Extremity Assessment LUE ROM/Strength/Tone: WFL for tasks assessed     Mobility Bed Mobility Bed Mobility: Supine to Sit Supine to Sit: 4: Min assist;HOB elevated;With rails Sitting - Scoot to Edge of Bed: 4: Min assist Details for Bed Mobility Assistance: Assist for RLE out of bed with cues for hand placement to self assist.  Transfers Sit to Stand: 4:  Min assist;From bed;From elevated surface;With upper  extremity assist Stand to Sit: 4: Min assist;To chair/3-in-1;With armrests;With upper extremity assist Details for Transfer Assistance: VCs safety, technique, hand placement. Assist to rise, stabilize, control descent.      Shoulder Instructions     Exercise     Balance     End of Session OT - End of Session Activity Tolerance: Patient limited by fatigue Patient left: in chair;with call bell/phone within reach  GO     , A OTR/L [phone] 01/23/2013, 12:34 PM

## 2013-01-23 NOTE — Progress Notes (Signed)
Physical Therapy Treatment Patient Details Name: Kristen Hansen MRN: [id number] DOB: May 27, 1946 Today's Date: 01/23/2013 Time: 1478-2956 PT Time Calculation (min): 23 min  PT Assessment / Plan / Recommendation Comments on Treatment Session  Progressing slowly with mobility. Pt reports increased pain 7/10 with activity. Limited distance due to pain, fatigue. Recommend HHPt with 24 hour supervision.     Follow Up Recommendations  Home health PT;Supervision/Assistance - 24 hour     Does the patient have the potential to tolerate intense rehabilitation     Barriers to Discharge        Equipment Recommendations  None recommended by PT    Recommendations for Other Services    Frequency 7X/week   Plan Discharge plan remains appropriate    Precautions / Restrictions Precautions Precautions: Knee Required Braces or Orthoses: Knee Immobilizer - Right Knee Immobilizer - Right: Discontinue once straight leg raise with < 10 degree lag Restrictions Weight Bearing Restrictions: No RLE Weight Bearing: Weight bearing as tolerated   Pertinent Vitals/Pain BP 147/65 sitting EOB    Mobility  Bed Mobility Bed Mobility: Supine to Sit Supine to Sit: 4: Min assist;HOB elevated;With rails Sitting - Scoot to Edge of Bed: 4: Min assist Details for Bed Mobility Assistance: Assist for RLE out of bed with cues for hand placement to self assist.  Transfers Transfers: Sit to Stand;Stand to Sit Sit to Stand: 4: Min assist;From bed;From elevated surface;With upper extremity assist Stand to Sit: 4: Min assist;To chair/3-in-1;With armrests;With upper extremity assist Details for Transfer Assistance: VCs safety, technique, hand placement. Assist to rise, stabilize, control descent.  Ambulation/Gait Ambulation/Gait Assistance: 4: Min assist Ambulation Distance (Feet): 20 Feet Assistive device: Rolling walker Ambulation/Gait Assistance Details: VCs safety, technique, seqence, WB status. Slow gait speed.  Difficulty WBing on R LE. Pt reports pain as 7/10 with ambulation. Fatigues easily. follwed with recliner and wheeled back to room  Gait Pattern: Step-to pattern;Trunk flexed;Antalgic;Decreased stride length;Decreased step length - right;Decreased stance time - right    Exercises     PT Diagnosis:    PT Problem List:   PT Treatment Interventions:     PT Goals Acute Rehab PT Goals Pt will go Supine/Side to Sit: with supervision PT Goal: Supine/Side to Sit - Progress: Progressing toward goal Pt will go Sit to Stand: with supervision PT Goal: Sit to Stand - Progress: Progressing toward goal Pt will go Stand to Sit: with supervision PT Goal: Stand to Sit - Progress: Progressing toward goal Pt will Ambulate: 51 - 150 feet;with supervision;with least restrictive assistive device PT Goal: Ambulate - Progress: Progressing toward goal  Visit Information  Last PT Received On: 01/23/13 Assistance Needed: +1    Subjective Data  Subjective: "Is it normal to have pain down the front of my leg?" Patient Stated Goal: Less pain.    Cognition  Overall Cognitive Status: Appears within functional limits for tasks assessed/performed Arousal/Alertness: Awake/alert Orientation Level: Appears intact for tasks assessed Behavior During Session: [facility] for tasks performed    Balance     End of Session PT - End of Session Equipment Utilized During Treatment: Right knee immobilizer Activity Tolerance: Patient limited by pain;Patient limited by fatigue Patient left: in chair;with call bell/phone within reach;with family/visitor present CPM Right Knee CPM Right Knee: Off   GP     Rebeca Alert [facility] 01/23/2013, 11:04 AM [phone]

## 2013-01-23 NOTE — Progress Notes (Signed)
   Subjective: 1 Day Post-Op Procedure(s) (LRB): TOTAL KNEE ARTHROPLASTY (Right) Patient reports pain as moderate.   Patient seen in rounds with Dr. Lequita Halt. Patient is having problems with pain in the knee, requiring pain medications.  Blood Pressure has been slightly elevated also. We will start therapy today.  Plan is to go Home after hospital stay.  Objective: Vital signs in last 24 hours: Temp:  [92.9 F (33.8 C)-98.7 F (37.1 C)] 98.6 F (37 C) (01/30 0603) Pulse Rate:  [70-94] 82  (01/30 0603) Resp:  [11-20] 17  (01/30 0603) BP: (130-171)/(62-95) 162/62 mmHg (01/30 0621) SpO2:  [95 %-100 %] 100 % (01/30 0603) Weight:  [79.833 kg (176 lb)] 79.833 kg (176 lb) (01/29 1210)  Intake/Output from previous day:  Intake/Output Summary (Last 24 hours) at 01/23/13 0809 Last data filed at 01/23/13 0615  Gross per 24 hour  Intake 3242.5 ml  Output   2405 ml  Net  837.5 ml    Intake/Output this shift: UOP 550 since MN +662  Labs:  [facility] 01/23/13 0420  HGB 10.4*    Basename 01/23/13 0420  WBC 9.3  RBC 3.35*  HCT 30.3*  PLT 218    Basename 01/23/13 0420  NA 127*  K 3.5  CL 95*  CO2 24  BUN 7  CREATININE 0.60  GLUCOSE 126*  CALCIUM 7.9*   No results found for this basename: LABPT:2,INR:2 in the last 72 hours  EXAM General - Patient is Alert, Appropriate and Oriented Extremity - Neurovascular intact Sensation intact distally Dorsiflexion/Plantar flexion intact No cellulitis present Dressing - dressing C/D/I Motor Function - intact, moving foot and toes well on exam.  Hemovac pulled without difficulty.  Past Medical History  Diagnosis Date  . 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 Rheumatoid arthritis   . Hypercholesteremia   . Hypertension   . GERD (gastroesophageal reflux disease)   . Headache   . Osteoarthritis   . PONV (postoperative nausea and vomiting)     Assessment/Plan: 1 Day Post-Op Procedure(s) (LRB): TOTAL KNEE ARTHROPLASTY (Right) Principal  Problem:  *Pain due to unicompartmental arthroplasty of knee Active Problems:  Postop Hyponatremia  Postoperative anemia due to acute blood loss  Estimated Body mass index is 25.99 kg/(m^2) as calculated from the following:   Height as of this encounter: 5\' 9" (1.753 m).   Weight as of this encounter: 176 lb(79.833 kg). Advance diet Up with therapy Discharge home with home health when ready  DVT Prophylaxis - Lovenox, ASA 81 mg on hold Weight-Bearing as tolerated to right leg No vaccines. D/C O2 and Pulse OX and try on Room Air  ,  01/23/2013, 8:09 AM

## 2013-01-23 NOTE — Care Management Note (Addendum)
    [place] 1 of 2   01/24/2013     4:59:33 PM   CARE MANAGEMENT NOTE 01/24/2013  Patient:  Kristen Hansen, Kristen Hansen   Account Number:  [id number]  Date Initiated:  01/23/2013  Documentation initiated by:  Colleen Can  Subjective/Objective Assessment:   dx failed rt uniknee; hx RA;  total knee replacemnt     Action/Plan:   CM spoke with patient. Plans [place]. She already has DME. She is requesting [facility] for [facility] services.   Anticipated DC Date:  01/25/2013   Anticipated DC Plan:  HOME W HOME HEALTH SERVICES  In-house referral  NA      DC Planning Services  CM consult      [facility] Choice  HOME HEALTH   Choice offered to / List presented to:  C-1 Patient   DME arranged  NA      DME agency  NA     HH arranged  HH-2 PT      HH agency  [facility]   Status of service:  Completed, signed off Medicare Important Message given?  NA - LOS <3 / Initial given by admissions (If response is "NO", the following Medicare IM given date fields will be blank) Date Medicare IM given:   Date Additional Medicare IM given:    Discharge Disposition:    Per UR Regulation:  Reviewed for med. necessity/level of care/duration of stay  If discussed at Long Length of Stay Meetings, dates discussed:    Comments:  01/24/2013 Colleen Can BSN RN CCM [phone] Anticipate discharge tomorrow. [facility] notified of poss discharge. Plans are for services to start 01/26/2013.  01/23/2013 Colleen Can BSN RN CCM [phone] CM spoke with [facility] rep who advised that they could provide HHpt services. Cm will follow HH needs.

## 2013-01-23 NOTE — Progress Notes (Signed)
Physical Therapy Treatment Patient Details Name: Kristen Hansen MRN: [id number] DOB: 1946-12-23 Today's Date: 01/23/2013 Time: 2440-1027 PT Time Calculation (min): 32 min  PT Assessment / Plan / Recommendation Comments on Treatment Session  Progressing with mobility. Recommend HHPT.     Follow Up Recommendations  Home health PT;Supervision/Assistance - 24 hour     Does the patient have the potential to tolerate intense rehabilitation     Barriers to Discharge        Equipment Recommendations  None recommended by PT    Recommendations for Other Services OT consult  Frequency 7X/week   Plan Discharge plan remains appropriate    Precautions / Restrictions Precautions Precautions: Knee Required Braces or Orthoses: Knee Immobilizer - Right Knee Immobilizer - Right: Discontinue once straight leg raise with < 10 degree lag Restrictions Weight Bearing Restrictions: No RLE Weight Bearing: Weight bearing as tolerated   Pertinent Vitals/Pain 6/10 R knee    Mobility  Bed Mobility Bed Mobility: Sit to Supine Sitting - Scoot to Edge of Bed: 4: Min assist Sit to Supine: 4: Min assist Details for Bed Mobility Assistance: assist for R LE onto bed.  Transfers Transfers: Sit to Stand;Stand to Sit Sit to Stand: 4: Min assist;From chair/3-in-1;With armrests Stand to Sit: 4: Min guard;To bed;With upper extremity assist Details for Transfer Assistance: VCs safety, technique, hand placement. Assist to rise, stabilize Ambulation/Gait Ambulation/Gait Assistance: 4: Min guard Ambulation Distance (Feet): 80 Feet Assistive device: Rolling walker Ambulation/Gait Assistance Details: VCs safety, technique, seqence, WB status. Slow gait speed. Fatigues fairly easily (arms moreso than legs) Gait Pattern: Step-to pattern;Trunk flexed;Antalgic;Decreased stride length;Decreased step length - right    Exercises Total Joint Exercises Ankle Circles/Pumps: AROM;10 reps;Supine;Right Quad Sets:  AROM;Right;10 reps;Supine Short Arc Quad: AAROM;Right;10 reps;Supine Heel Slides: AAROM;Right;10 reps;Supine Hip ABduction/ADduction: AAROM;Right;10 reps;Supine Straight Leg Raises: AAROM;Right;10 reps;Supine   PT Diagnosis:    PT Problem List:   PT Treatment Interventions:     PT Goals Acute Rehab PT Goals Pt will go Supine/Side to Sit: with supervision PT Goal: Supine/Side to Sit - Progress: Progressing toward goal Pt will go Sit to Supine/Side: with supervision PT Goal: Sit to Supine/Side - Progress: Progressing toward goal Pt will go Sit to Stand: with supervision PT Goal: Sit to Stand - Progress: Progressing toward goal Pt will go Stand to Sit: with supervision PT Goal: Stand to Sit - Progress: Progressing toward goal Pt will Ambulate: 51 - 150 feet;with supervision;with least restrictive assistive device PT Goal: Ambulate - Progress: Progressing toward goal  Visit Information  Last PT Received On: 01/23/13 Assistance Needed: +1    Subjective Data  Subjective: "I've been in this chair since you left" Patient Stated Goal: less pain.    Cognition  Overall Cognitive Status: Appears within functional limits for tasks assessed/performed Arousal/Alertness: Awake/alert Orientation Level: Appears intact for tasks assessed Behavior During Session: [facility] for tasks performed    Balance     End of Session PT - End of Session Equipment Utilized During Treatment: Gait belt;Right knee immobilizer Activity Tolerance: Patient tolerated treatment well Patient left: in bed;with call bell/phone within reach;with family/visitor present   GP     Rebeca Alert [facility] 01/23/2013, 2:56 PM [phone]

## 2013-01-24 LAB — CBC
HCT: 28.8 % — ABNORMAL LOW (ref 36.0–46.0)
Hemoglobin: 10.1 g/dL — ABNORMAL LOW (ref 12.0–15.0)
RDW: 12.8 % (ref 11.5–15.5)
WBC: 11.9 10*3/uL — ABNORMAL HIGH (ref 4.0–10.5)

## 2013-01-24 LAB — BASIC METABOLIC PANEL
BUN: 9 mg/dL (ref 6–23)
Chloride: 98 mEq/L (ref 96–112)
GFR calc Af Amer: >90 mL/min (ref >90)
Potassium: 3.7 mEq/L (ref 3.5–5.1)

## 2013-01-24 MED ORDER — ENOXAPARIN SODIUM 30 MG/0.3ML ~~LOC~~ SOLN: 30.0000 mg | Freq: Two times a day (BID) | SUBCUTANEOUS | Status: DC

## 2013-01-24 MED ORDER — OXYCODONE HCL 5 MG PO TABS: 5.0000 mg | ORAL_TABLET | ORAL | Status: DC | PRN

## 2013-01-24 MED ORDER — METHOCARBAMOL 500 MG PO TABS: 500.0000 mg | ORAL_TABLET | Freq: Four times a day (QID) | ORAL | Status: DC | PRN

## 2013-01-24 NOTE — Progress Notes (Signed)
Physical Therapy Treatment Patient Details Name: Kristen Hansen MRN: [id number] DOB: [birth year] Today's Date: 01/24/2013 Time: 4098-1191 PT Time Calculation (min): 35 min  PT Assessment / Plan / Recommendation Comments on Treatment Session  continuing to progress well. activity tolerance slowly improving. Will need to practice steps on tomorrow. Recommend HHPT    Follow Up Recommendations  Home health PT     Does the patient have the potential to tolerate intense rehabilitation     Barriers to Discharge        Equipment Recommendations  None recommended by PT    Recommendations for Other Services OT consult  Frequency 7X/week   Plan Discharge plan remains appropriate    Precautions / Restrictions Precautions Precautions: Knee Required Braces or Orthoses: Knee Immobilizer - Right Knee Immobilizer - Right: Discontinue once straight leg raise with < 10 degree lag Restrictions Weight Bearing Restrictions: No RLE Weight Bearing: Weight bearing as tolerated   Pertinent Vitals/Pain 6/10 R knee    Mobility  Bed Mobility Bed Mobility: Supine to Sit;Sit to Supine Supine to Sit: 4: Min assist Sit to Supine: 4: Min assist Details for Bed Mobility Assistance: assist for R LE onto/off of bed.  Transfers Transfers: Sit to Stand;Stand to Sit Sit to Stand: 4: Min guard;From bed;From elevated surface;With upper extremity assist Stand to Sit: 4: Min guard;With upper extremity assist;To elevated surface;To bed Details for Transfer Assistance: VCs safety, technique, hand placement Ambulation/Gait Ambulation/Gait Assistance: 4: Min guard Ambulation Distance (Feet): 90 Feet Assistive device: Rolling walker Ambulation/Gait Assistance Details: VCs safety, technique, seqence, WB status. Slow gait speed Gait Pattern: Step-to pattern;Decreased stride length;Antalgic    Exercises Total Joint Exercises Ankle Circles/Pumps: AROM;Both;10 reps;Supine Quad Sets: AROM;Right;10 reps;Supine Short  Arc Quad: AAROM;Right;10 reps;Supine Heel Slides: AAROM;Right;10 reps;Supine Hip ABduction/ADduction: AAROM;Right;10 reps;Supine Straight Leg Raises: AAROM;Right;10 reps;Supine   PT Diagnosis:    PT Problem List:   PT Treatment Interventions:     PT Goals Acute Rehab PT Goals Pt will go Supine/Side to Sit: with supervision PT Goal: Supine/Side to Sit - Progress: Progressing toward goal Pt will go Sit to Supine/Side: with supervision PT Goal: Sit to Supine/Side - Progress: Progressing toward goal Pt will go Sit to Stand: with supervision PT Goal: Sit to Stand - Progress: Progressing toward goal Pt will go Stand to Sit: with supervision PT Goal: Stand to Sit - Progress: Progressing toward goal Pt will Ambulate: 51 - 150 feet;with supervision;with least restrictive assistive device PT Goal: Ambulate - Progress: Progressing toward goal  Visit Information  Last PT Received On: 01/24/13 Assistance Needed: +1    Subjective Data  Subjective: "My arms aren't as tired this time" Patient Stated Goal: less pain.    Cognition  Overall Cognitive Status: Appears within functional limits for tasks assessed/performed Arousal/Alertness: Awake/alert Orientation Level: Appears intact for tasks assessed Behavior During Session: [facility] for tasks performed    Balance     End of Session PT - End of Session Equipment Utilized During Treatment: Gait belt;Right knee immobilizer Activity Tolerance: Patient tolerated treatment well Patient left: with call bell/phone within reach   GP     Rebeca Alert [facility] 01/24/2013, 3:51 PM [phone]

## 2013-01-24 NOTE — Progress Notes (Signed)
   Subjective: 2 Days Post-Op Procedure(s) (LRB): TOTAL KNEE ARTHROPLASTY (Right) Patient reports pain as mild.   Patient seen in rounds with Dr. Lequita Halt. Patient is well, but has had some minor complaints of pain in the knee, requiring pain medications bur she is better today. Patient has improved and is he does well and meets all goals, then possibly home later today, otherwise, tomorrow.  Will see how she does today.  Objective: Vital signs in last 24 hours: Temp:  [94.6 F (34.8 C)-99 F (37.2 C)] 99 F (37.2 C) (01/31 9604) Pulse Rate:  [71-100] 100  (01/31 0637) Resp:  [14-16] 16  (01/31 0637) BP: (134-177)/(77-101) 177/77 mmHg (01/31 0637) SpO2:  [97 %-100 %] 97 % (01/31 0637)  Intake/Output from previous day:  Intake/Output Summary (Last 24 hours) at 01/24/13 0827 Last data filed at 01/24/13 0800  Gross per 24 hour  Intake   2798 ml  Output   3700 ml  Net   -902 ml    Intake/Output this shift: Total I/O In: 120 [P.O.:120] Out: -   Labs:  Basename 01/24/13 0410 01/23/13 0420  HGB 10.1* 10.4*    Basename 01/24/13 0410 01/23/13 0420  WBC 11.9* 9.3  RBC 3.22* 3.35*  HCT 28.8* 30.3*  PLT 199 218    Basename 01/24/13 0410 01/23/13 0420  NA 131* 127*  K 3.7 3.5  CL 98 95*  CO2 25 24  BUN 9 7  CREATININE 0.67 0.60  GLUCOSE 123* 126*  CALCIUM 8.2* 7.9*   No results found for this basename: LABPT:2,INR:2 in the last 72 hours  EXAM: General - Patient is Alert, Appropriate and Oriented Extremity - Neurovascular intact Sensation intact distally Dorsiflexion/Plantar flexion intact No cellulitis present Incision - clean, dry, no drainage, healing Motor Function - intact, moving foot and toes well on exam.   Assessment/Plan: 2 Days Post-Op Procedure(s) (LRB): TOTAL KNEE ARTHROPLASTY (Right) Procedure(s) (LRB): TOTAL KNEE ARTHROPLASTY (Right) Past Medical History  Diagnosis Date  . 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 Rheumatoid arthritis   . Hypercholesteremia   .  Hypertension   . GERD (gastroesophageal reflux disease)   . Headache   . Osteoarthritis   . PONV (postoperative nausea and vomiting)    Principal Problem:  *Pain due to unicompartmental arthroplasty of knee Active Problems:  Postop Hyponatremia  Postoperative anemia due to acute blood loss  Estimated Body mass index is 25.99 kg/(m^2) as calculated from the following:   Height as of this encounter: 5\' 9" (1.753 m).   Weight as of this encounter: 176 lb(79.833 kg). Advance diet Up with therapy Discharge home with home health if met all her goals Diet - Cardiac diet Follow up - in 2 weeks Activity - WBAT Disposition - Home Condition Upon Discharge - Pending, home if does well and meets goals. D/C Meds - See DC Summary DVT Prophylaxis - Lovenox for 10 days, ASA 81 mg on hold. Resume Aspirin after the 10 days of Lovenox.   PERKINS, ALEXZANDREW 01/24/2013, 8:27 AM

## 2013-01-24 NOTE — Progress Notes (Signed)
Physical Therapy Treatment Patient Details Name: Kristen Hansen MRN: [id number] DOB: 1946-12-02 Today's Date: 01/24/2013 Time: 4098-1191 PT Time Calculation (min): 20 min  PT Assessment / Plan / Recommendation Comments on Treatment Session  Pt continues to demonstrate decreased endurance, UEs moreso than the LEs. Progressing with mobility. Recommend HHPT.     Follow Up Recommendations  Home health PT;Supervision/Assistance - 24 hour     Does the patient have the potential to tolerate intense rehabilitation     Barriers to Discharge        Equipment Recommendations  None recommended by PT    Recommendations for Other Services OT consult  Frequency 7X/week   Plan Discharge plan remains appropriate    Precautions / Restrictions Precautions Precautions: Back Required Braces or Orthoses: Knee Immobilizer - Right Knee Immobilizer - Right: Discontinue once straight leg raise with < 10 degree lag Restrictions Weight Bearing Restrictions: No RLE Weight Bearing: Weight bearing as tolerated   Pertinent Vitals/Pain 7/10 R knee    Mobility  Transfers Transfers: Sit to Stand;Stand to Sit Sit to Stand: 4: Min guard;From chair/3-in-1 Stand to Sit: 4: Min guard;To chair/3-in-1 Details for Transfer Assistance: VCs safety, technique, hand placement.  Ambulation/Gait Ambulation/Gait Assistance: 4: Min guard Ambulation Distance (Feet): 80 Feet Assistive device: Rolling walker Ambulation/Gait Assistance Details: VCs safety, technique, seqence, WB status. Slow gait speed. Fatigues fairly easily (arms moreso than legs Gait Pattern: Step-to pattern;Trunk flexed;Antalgic;Decreased stride length    Exercises     PT Diagnosis:    PT Problem List:   PT Treatment Interventions:     PT Goals Acute Rehab PT Goals Pt will go Sit to Stand: with supervision PT Goal: Sit to Stand - Progress: Progressing toward goal Pt will go Stand to Sit: with supervision PT Goal: Stand to Sit - Progress:  Progressing toward goal Pt will Ambulate: 51 - 150 feet;with supervision;with least restrictive assistive device PT Goal: Ambulate - Progress: Progressing toward goal  Visit Information  Last PT Received On: 01/24/13 Assistance Needed: +1    Subjective Data  Subjective: "Its more sore today than yesterday" Patient Stated Goal: less pain   Cognition  Overall Cognitive Status: Appears within functional limits for tasks assessed/performed Arousal/Alertness: Awake/alert Orientation Level: Appears intact for tasks assessed Behavior During Session: [facility] for tasks performed    Balance     End of Session PT - End of Session Equipment Utilized During Treatment: Gait belt;Right knee immobilizer Activity Tolerance: Patient tolerated treatment well Patient left: in chair;with call bell/phone within reach   GP     Rebeca Alert [facility] 01/24/2013, 12:30 PM [phone]

## 2013-01-24 NOTE — Discharge Summary (Signed)
Physician Discharge Summary   Patient ID: Kristen Hansen MRN: [id number] DOB/AGE: Apr 26, 1946 67 y.o.  Admit date: 01/22/2013 Discharge date: 01/25/2013  Primary Diagnosis:  Failed right knee unicompartmental replacement  Admission Diagnoses:  Past Medical History  Diagnosis Date  . 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 Rheumatoid arthritis   . Hypercholesteremia   . Hypertension   . GERD (gastroesophageal reflux disease)   . Headache   . Osteoarthritis   . PONV (postoperative nausea and vomiting)    Discharge Diagnoses:   Principal Problem:  *Pain due to unicompartmental arthroplasty of knee Active Problems:  Postop Hyponatremia  Postoperative anemia due to acute blood loss  Estimated Body mass index is 25.99 kg/(m^2) as calculated from the following:   Height as of this encounter: 5\' 9" (1.753 m).   Weight as of this encounter: 176 lb(79.833 kg).  Classification of overweight in adults according to BMI (WHO, 1998)   Procedure:  Procedure(s) (LRB): TOTAL KNEE ARTHROPLASTY (Right)   Consults: None  HPI: Ms. Smyre is a 67 year old female, had a medial  unicompartmental replacement done in right knee several years ago. Past  few years, she has noticed increased pain. Radiographs have shown  progression of arthritis of patellofemoral lateral compartment. She has  failed nonoperative management and presents now for revision to a total  knee arthroplasty.  Laboratory Data: Admission on 01/22/2013  Component Date Value Range Status  . ABO/RH(D) 01/22/2013 O POS   Final  . Antibody Screen 01/22/2013 NEG   Final  . Sample Expiration 01/22/2013 01/25/2013   Final  . WBC 01/23/2013 9.3  4.0 - 10.5 K/uL Final  . RBC 01/23/2013 3.35* 3.87 - 5.11 MIL/uL Final  . Hemoglobin 01/23/2013 10.4* 12.0 - 15.0 g/dL Final  . HCT 78/29/5621 30.3* 36.0 - 46.0 % Final  . MCV 01/23/2013 90.4  78.0 - 100.0 fL Final  . MCH 01/23/2013 31.0  26.0 - 34.0 pg Final  . MCHC 01/23/2013 34.3  30.0 - 36.0 g/dL Final    . RDW 30/86/5784 12.8  11.5 - 15.5 % Final  . Platelets 01/23/2013 218  150 - 400 K/uL Final  . Sodium 01/23/2013 127* 135 - 145 mEq/L Final  . Potassium 01/23/2013 3.5  3.5 - 5.1 mEq/L Final  . Chloride 01/23/2013 95* 96 - 112 mEq/L Final  . CO2 01/23/2013 24  19 - 32 mEq/L Final  . Glucose, Bld 01/23/2013 126* 70 - 99 mg/dL Final  . BUN 69/62/9528 7  6 - 23 mg/dL Final  . Creatinine, Ser 01/23/2013 0.60  0.50 - 1.10 mg/dL Final  . Calcium 41/32/4401 7.9* 8.4 - 10.5 mg/dL Final  . GFR calc non Af Amer 01/23/2013 >90  >90 mL/min Final  . GFR calc Af Amer 01/23/2013 >90  >90 mL/min Final   Comment:                                 The eGFR has been calculated                          using the CKD EPI equation.                          This calculation has not been  validated in all clinical                          situations.                          eGFR's persistently                          <90 mL/min signify                          possible Chronic Kidney Disease.  . WBC 01/24/2013 11.9* 4.0 - 10.5 K/uL Final  . RBC 01/24/2013 3.22* 3.87 - 5.11 MIL/uL Final  . Hemoglobin 01/24/2013 10.1* 12.0 - 15.0 g/dL Final  . HCT 40/98/1191 28.8* 36.0 - 46.0 % Final  . MCV 01/24/2013 89.4  78.0 - 100.0 fL Final  . MCH 01/24/2013 31.4  26.0 - 34.0 pg Final  . MCHC 01/24/2013 35.1  30.0 - 36.0 g/dL Final  . RDW 47/82/9562 12.8  11.5 - 15.5 % Final  . Platelets 01/24/2013 199  150 - 400 K/uL Final  . Sodium 01/24/2013 131* 135 - 145 mEq/L Final  . Potassium 01/24/2013 3.7  3.5 - 5.1 mEq/L Final  . Chloride 01/24/2013 98  96 - 112 mEq/L Final  . CO2 01/24/2013 25  19 - 32 mEq/L Final  . Glucose, Bld 01/24/2013 123* 70 - 99 mg/dL Final  . BUN 13/07/6577 9  6 - 23 mg/dL Final  . Creatinine, Ser 01/24/2013 0.67  0.50 - 1.10 mg/dL Final  . Calcium 46/96/2952 8.2* 8.4 - 10.5 mg/dL Final  . GFR calc non Af Amer 01/24/2013 90* >90 mL/min Final  . GFR calc Af Amer  01/24/2013 >90  >90 mL/min Final   Comment:                                 The eGFR has been calculated                          using the CKD EPI equation.                          This calculation has not been                          validated in all clinical                          situations.                          eGFR's persistently                          <90 mL/min signify                          possible Chronic Kidney Disease.  Hospital Outpatient Visit on 01/16/2013  Component Date Value Range Status  . aPTT 01/16/2013 34  24 - 37 seconds Final  . WBC 01/16/2013 6.2  4.0 - 10.5 K/uL Final  . RBC 01/16/2013 4.10  3.87 - 5.11 MIL/uL Final  . Hemoglobin 01/16/2013 12.5  12.0 - 15.0 g/dL Final  . HCT 29/56/2130 37.5  36.0 - 46.0 % Final  . MCV 01/16/2013 91.5  78.0 - 100.0 fL Final  . MCH 01/16/2013 30.5  26.0 - 34.0 pg Final  . MCHC 01/16/2013 33.3  30.0 - 36.0 g/dL Final  . RDW 86/57/8469 12.8  11.5 - 15.5 % Final  . Platelets 01/16/2013 260  150 - 400 K/uL Final  . Sodium 01/16/2013 134* 135 - 145 mEq/L Final  . Potassium 01/16/2013 4.0  3.5 - 5.1 mEq/L Final  . Chloride 01/16/2013 98  96 - 112 mEq/L Final  . CO2 01/16/2013 27  19 - 32 mEq/L Final  . Glucose, Bld 01/16/2013 87  70 - 99 mg/dL Final  . BUN 62/95/2841 13  6 - 23 mg/dL Final  . Creatinine, Ser 01/16/2013 0.70  0.50 - 1.10 mg/dL Final  . Calcium 32/44/0102 9.0  8.4 - 10.5 mg/dL Final  . Total Protein 01/16/2013 7.4  6.0 - 8.3 g/dL Final  . Albumin 72/53/6644 3.4* 3.5 - 5.2 g/dL Final  . AST 03/47/4259 13  0 - 37 U/L Final  . ALT 01/16/2013 12  0 - 35 U/L Final  . Alkaline Phosphatase 01/16/2013 118* 39 - 117 U/L Final  . Total Bilirubin 01/16/2013 0.1* 0.3 - 1.2 mg/dL Final  . GFR calc non Af Amer 01/16/2013 88* >90 mL/min Final  . GFR calc Af Amer 01/16/2013 >90  >90 mL/min Final   Comment:                                 The eGFR has been calculated                          using the CKD EPI  equation.                          This calculation has not been                          validated in all clinical                          situations.                          eGFR's persistently                          <90 mL/min signify                          possible Chronic Kidney Disease.  Marland Kitchen Prothrombin Time 01/16/2013 13.1  11.6 - 15.2 seconds Final  . INR 01/16/2013 1.00  0.00 - 1.49 Final  . Color, Urine 01/16/2013 YELLOW  YELLOW Final  . APPearance 01/16/2013 CLEAR  CLEAR Final  . Specific Gravity, Urine 01/16/2013 1.017  1.005 - 1.030 Final  . pH 01/16/2013 6.0  5.0 - 8.0 Final  . Glucose, UA 01/16/2013 NEGATIVE  NEGATIVE mg/dL Final  . Hgb urine dipstick 01/16/2013 NEGATIVE  NEGATIVE Final  . Bilirubin Urine 01/16/2013 NEGATIVE  NEGATIVE Final  . Ketones, ur 01/16/2013 NEGATIVE  NEGATIVE mg/dL  Final  . Protein, ur 01/16/2013 NEGATIVE  NEGATIVE mg/dL Final  . Urobilinogen, UA 01/16/2013 0.2  0.0 - 1.0 mg/dL Final  . Nitrite 16/09/9603 NEGATIVE  NEGATIVE Final  . Leukocytes, UA 01/16/2013 SMALL* NEGATIVE Final  . MRSA, PCR 01/16/2013 NEGATIVE  NEGATIVE Final  . Staphylococcus aureus 01/16/2013 POSITIVE* NEGATIVE Final   Comment:                                 The Xpert SA Assay (FDA                          approved for NASAL specimens                          in patients over 5 years of age),                          is one component of                          a comprehensive surveillance                          program.  Test performance has                          been validated by [employer] for patients greater                          than or equal to 65 year old.                          It is not intended                          to diagnose infection nor to                          guide or monitor treatment.  . Squamous Epithelial / LPF 01/16/2013 FEW* RARE Final  . WBC, UA 01/16/2013 0-2  <3 WBC/hpf Final  . RBC / HPF  01/16/2013 0-2  <3 RBC/hpf Final  . Bacteria, UA 01/16/2013 RARE  RARE Final     X-Rays:Dg Chest 2 View  01/16/2013  *RADIOLOGY REPORT*  Clinical Data: Preop.  Cough and congestion.  CHEST - 2 VIEW  Comparison: None.  Findings: Trachea is midline.  Heart size normal.  Lungs are clear. Minimal biapical pleural thickening.  No pleural fluid.  IMPRESSION: No acute findings.   Original Report Authenticated By: Leanna Battles, M.D.     EKG: Orders placed during the hospital encounter of 01/16/13  . EKG 12-LEAD  . EKG 12-LEAD     Hospital Course: Lei Dower Tatem is a 67 y.o. who was admitted to [facility]. They were brought to the operating room on 01/22/2013 and underwent Procedure(s): TOTAL KNEE ARTHROPLASTY.  Patient tolerated the procedure well and was later transferred to the recovery room and then to  the orthopaedic floor for postoperative care.  They were given PO and IV analgesics for pain control following their surgery.  They were given 24 hours of postoperative antibiotics of  Anti-infectives     Start     Dose/Rate Route Frequency Ordered Stop   01/22/13 1330   ceFAZolin (ANCEF) IVPB 1 g/50 mL premix        1 g 100 mL/hr over 30 Minutes Intravenous Every 6 hours 01/22/13 1218 01/22/13 2013   01/21/13 1722   ceFAZolin (ANCEF) IVPB 2 g/50 mL premix        2 g 100 mL/hr over 30 Minutes Intravenous 60 min pre-op 01/21/13 1722 01/22/13 0700         and started on DVT prophylaxis in the form of Lovenox.   PT and OT were ordered for total joint protocol.  Discharge planning consulted to help with postop disposition and equipment needs.  Patient had a tough night on the evening of surgery with pain and elevated blood pressures but started to get up OOB with therapy on day one walking 20 and then 80 feet. Hemovac drain was pulled without difficulty.  Continued to work with therapy into day two since her pain was under better control.  Dressing was changed on day two and the incision  was healing well.  By day three, the patient had progressed with therapy and meeting their goals.  Incision was healing well.  Patient was seen in rounds and was ready to go home.   Discharge Medications: Prior to Admission medications   Medication Sig Start Date End Date Taking? Authorizing Provider  acetaminophen (TYLENOL) 650 MG CR tablet Take 1,300 mg by mouth every 8 (eight) hours as needed. Pain   Yes Historical Provider, MD  amLODipine-olmesartan (AZOR) 5-40 MG per tablet Take 1 tablet by mouth every evening.    Yes Historical Provider, MD  clonazePAM (KLONOPIN) 0.5 MG tablet Take 0.5 mg by mouth every evening.    Yes Historical Provider, MD  hydroxypropyl methylcellulose (ISOPTO TEARS) 2.5 % ophthalmic solution Place 1 drop into both eyes as needed. Dry eyes   Yes Historical Provider, MD  imipramine (TOFRANIL) 25 MG tablet Take 25 mg by mouth at bedtime.   Yes Historical Provider, MD  lamoTRIgine (LAMICTAL) 100 MG tablet Take 100 mg by mouth 2 (two) times daily.   Yes Historical Provider, MD  loratadine (CLARITIN) 10 MG tablet Take 10 mg by mouth daily.   Yes Historical Provider, MD  omeprazole (PRILOSEC) 20 MG capsule Take 20 mg by mouth 2 (two) times daily.    Yes Historical Provider, MD  Oxcarbazepine (TRILEPTAL) 300 MG tablet Take 300-600 mg by mouth 3 (three) times daily. 1 in the morning 1 at noon and 2 at bedtime   Yes Historical Provider, MD  pravastatin (PRAVACHOL) 40 MG tablet Take 40 mg by mouth daily before breakfast.    Yes Historical Provider, MD  sennosides-docusate sodium (SENOKOT-S) 8.6-50 MG tablet Take 2 tablets by mouth every evening.    Yes Historical Provider, MD  enoxaparin (LOVENOX) 30 MG/0.3ML injection Inject 0.3 mLs (30 mg total) into the skin 2 (two) times daily. Continue Lovenox injections for 10 days, then the patient may discontinue the Lovenox injections. After stopping the Lovenox injections, the patient may resume her 81 mg Aspirin daily. 01/24/13    Alexzandrew Julien Girt, PA  ezetimibe (ZETIA) 10 MG tablet Take 10 mg by mouth daily before breakfast.     Historical Provider, MD  methocarbamol (ROBAXIN)  500 MG tablet Take 1 tablet (500 mg total) by mouth every 6 (six) hours as needed. 01/24/13   Alexzandrew Perkins, PA  oxyCODONE (OXY IR/ROXICODONE) 5 MG immediate release tablet Take 1-2 tablets (5-10 mg total) by mouth every 3 (three) hours as needed. 01/24/13   Alexzandrew Perkins, PA    Diet: Cardiac diet Activity:WBAT Follow-up:in 2 weeks Disposition - Home Discharged Condition: good   Discharge Orders    Future Orders Please Complete By Expires   Diet - low sodium heart healthy      Diet general      Diet Carb Modified      Call MD / Call 911      Comments:   If you experience chest pain or shortness of breath, CALL 911 and be transported to the hospital emergency room.  If you develope a fever above 101 F, pus (white drainage) or increased drainage or redness at the wound, or calf pain, call your surgeon's office.   Discharge instructions      Comments:   Pick up stool softner and laxative for home. Do not submerge incision under water. May shower. Continue to use ice for pain and swelling from surgery. Hip precautions.  Total Hip Protocol.  Take Xarelto for two and a half more weeks, then discontinue Xarelto.   Constipation Prevention      Comments:   Drink plenty of fluids.  Prune juice may be helpful.  You may use a stool softener, such as Colace (over the counter) 100 mg twice a day.  Use MiraLax (over the counter) for constipation as needed.   Increase activity slowly as tolerated      Patient may shower      Comments:   You may shower without a dressing once there is no drainage.  Do not wash over the wound.  If drainage remains, do not shower until drainage stops.   Weight bearing as tolerated      Driving restrictions      Comments:   No driving until released by the physician.   Lifting restrictions       Comments:   No lifting until released by the physician.   TED hose      Comments:   Use stockings (TED hose) for 3 weeks on both leg(s).  You may remove them at night for sleeping.   Change dressing      Comments:   Change dressing daily with sterile 4 x 4 inch gauze dressing and apply TED hose. Do not submerge the incision under water.   Do not put a pillow under the knee. Place it under the heel.      Do not sit on low chairs, stoools or toilet seats, as it may be difficult to get up from low surfaces          Medication List     As of 01/24/2013  8:45 AM    STOP taking these medications         aspirin 81 MG tablet      cevimeline 30 MG capsule   Commonly known as: EVOXAC      estradiol 0.1 mg/24hr   Commonly known as: CLIMARA - Dosed in mg/24 hr      etanercept 50 MG/ML injection   Commonly known as: ENBREL      folic acid 1 MG tablet   Commonly known as: FOLVITE      meloxicam 15 MG tablet   Commonly known as: MOBIC  Vitamin D (Ergocalciferol) 50000 UNITS Caps   Commonly known as: DRISDOL      TAKE these medications         acetaminophen 650 MG CR tablet   Commonly known as: TYLENOL   Take 1,300 mg by mouth every 8 (eight) hours as needed. Pain      AZOR 5-40 MG per tablet   Generic drug: amLODipine-olmesartan   Take 1 tablet by mouth every evening.      clonazePAM 0.5 MG tablet   Commonly known as: KLONOPIN   Take 0.5 mg by mouth every evening.      enoxaparin 30 MG/0.3ML injection   Commonly known as: LOVENOX   Inject 0.3 mLs (30 mg total) into the skin 2 (two) times daily. Continue Lovenox injections for 10 days, then the patient may discontinue the Lovenox injections. After stopping the Lovenox injections, the patient may resume her 81 mg Aspirin daily.      ezetimibe 10 MG tablet   Commonly known as: ZETIA   Take 10 mg by mouth daily before breakfast.      hydroxypropyl methylcellulose 2.5 % ophthalmic solution   Commonly known as: ISOPTO  TEARS   Place 1 drop into both eyes as needed. Dry eyes      imipramine 25 MG tablet   Commonly known as: TOFRANIL   Take 25 mg by mouth at bedtime.      lamoTRIgine 100 MG tablet   Commonly known as: LAMICTAL   Take 100 mg by mouth 2 (two) times daily.      loratadine 10 MG tablet   Commonly known as: CLARITIN   Take 10 mg by mouth daily.      methocarbamol 500 MG tablet   Commonly known as: ROBAXIN   Take 1 tablet (500 mg total) by mouth every 6 (six) hours as needed.      omeprazole 20 MG capsule   Commonly known as: PRILOSEC   Take 20 mg by mouth 2 (two) times daily.      Oxcarbazepine 300 MG tablet   Commonly known as: TRILEPTAL   Take 300-600 mg by mouth 3 (three) times daily. 1 in the morning 1 at noon and 2 at bedtime      oxyCODONE 5 MG immediate release tablet   Commonly known as: Oxy IR/ROXICODONE   Take 1-2 tablets (5-10 mg total) by mouth every 3 (three) hours as needed.      pravastatin 40 MG tablet   Commonly known as: PRAVACHOL   Take 40 mg by mouth daily before breakfast.      sennosides-docusate sodium 8.6-50 MG tablet   Commonly known as: SENOKOT-S   Take 2 tablets by mouth every evening.           Follow-up Information    Follow up with Loanne Drilling, MD. Schedule an appointment as soon as possible for a visit in 2 weeks.   Contact information:   [address], [address] [place] [address] [phone]          Signed: Patrica Duel 01/24/2013, 8:45 AM

## 2013-01-25 LAB — CBC
HCT: 26.5 % — ABNORMAL LOW (ref 36.0–46.0)
Hemoglobin: 9 g/dL — ABNORMAL LOW (ref 12.0–15.0)
MCHC: 34 g/dL (ref 30.0–36.0)
RBC: 2.92 MIL/uL — ABNORMAL LOW (ref 3.87–5.11)

## 2013-01-25 NOTE — Progress Notes (Signed)
Subjective: 3 Days Post-Op Procedure(s) (LRB): TOTAL KNEE ARTHROPLASTY (Right) Patient reports pain as moderate.   Denies any chest pain SOB dizziness or lightheadedness.  Objective: Vital signs in last 24 hours: Temp:  [98 F (36.7 C)-99 F (37.2 C)] 99 F (37.2 C) (02/01 0500) Pulse Rate:  [97-104] 97  (02/01 0500) Resp:  [16-18] 18  (02/01 0500) BP: (129-158)/(53-77) 157/64 mmHg (02/01 0500) SpO2:  [98 %-100 %] 98 % (02/01 0500)  Intake/Output from previous day: 01/31 0701 - 02/01 0700 In: 960 [P.O.:960] Out: 600 [Urine:600] Intake/Output this shift:     Basename 01/25/13 0415 01/24/13 0410 01/23/13 0420  HGB 9.0* 10.1* 10.4*    Basename 01/25/13 0415 01/24/13 0410  WBC 7.1 11.9*  RBC 2.92* 3.22*  HCT 26.5* 28.8*  PLT 194 199    Basename 01/24/13 0410 01/23/13 0420  NA 131* 127*  K 3.7 3.5  CL 98 95*  CO2 25 24  BUN 9 7  CREATININE 0.67 0.60  GLUCOSE 123* 126*  CALCIUM 8.2* 7.9*   No results found for this basename: LABPT:2,INR:2 in the last 72 hours General - awake alert in no acute distress  Right lower leg: Compartment soft right leg Wound benign no drainage DP 2+ Sensation grossly intact to light touch right foot  Assessment/Plan: 3 Days Post-Op Procedure(s) (LRB): TOTAL KNEE ARTHROPLASTY (Right) Discharge home  Patient on Lovenox for DVT prophylaxis  Post-op anemia secondary to blood loss patient asymptomatic Follow-up in 2 weeks with Dr. Christel Mormon,  01/25/2013, 8:38 AM

## 2013-01-25 NOTE — Progress Notes (Signed)
Physical Therapy Treatment Patient Details Name: Kristen Hansen MRN: [id number] DOB: 05-Nov-1946 Today's Date: 01/25/2013 Time: 3086-5784 PT Time Calculation (min): 30 min  PT Assessment / Plan / Recommendation Comments on Treatment Session  Reviewed ambulation, exercises, stair negotiation. All education completed. Instructed pt to practice exercises one more time this evening at home. Issued handout (exercise). No further questions/concerns from pt. Ready for d/c home. Recommend HHPT.    Follow Up Recommendations  Home health PT     Does the patient have the potential to tolerate intense rehabilitation     Barriers to Discharge        Equipment Recommendations  None recommended by PT    Recommendations for Other Services    Frequency 7X/week   Plan Discharge plan remains appropriate    Precautions / Restrictions Precautions Precautions: Knee Required Braces or Orthoses: Knee Immobilizer - Right Knee Immobilizer - Right: Discontinue once straight leg raise with < 10 degree lag Restrictions Weight Bearing Restrictions: No RLE Weight Bearing: Weight bearing as tolerated   Pertinent Vitals/Pain 6/10 R knee    Mobility  Bed Mobility Bed Mobility: Sit to Supine Sit to Supine: 4: Min assist Details for Bed Mobility Assistance: assist for R LE onto/off of bed Transfers Transfers: Stand to Sit;Sit to Stand Sit to Stand: 4: Min guard;From chair/3-in-1 Stand to Sit: 4: Min guard;To bed;To chair/3-in-1 Details for Transfer Assistance: VCs safety, technique, hand placement  Ambulation/Gait Ambulation/Gait Assistance: 4: Min guard Ambulation Distance (Feet): 150 Feet Assistive device: Rolling walker Ambulation/Gait Assistance Details: Encouraged increased WBing through R LE to decrease pressure on UEs. 1 standing rest break needed to rest arms.  Gait Pattern: Step-to pattern;Antalgic;Decreased stance time - right;Decreased stride length Stairs: Yes Stairs Assistance: 4: Min  guard Stairs Assistance Details (indicate cue type and reason): VCs safety, technique, sequence.  Stair Management Technique: One rail Left;With crutches;Forwards;Step to pattern Number of Stairs: 8  (4x2) Wheelchair Mobility Wheelchair Mobility: No    Exercises Total Joint Exercises Ankle Circles/Pumps: AROM;Both;15 reps;Seated Quad Sets: AROM;Both;10 reps;Seated Short Arc Quad: AAROM;Right;10 reps;Seated Heel Slides: AAROM;Right;10 reps;Seated Hip ABduction/ADduction: AAROM;Right;10 reps;Seated Straight Leg Raises: AAROM;Right;10 reps;Seated   PT Diagnosis:    PT Problem List:   PT Treatment Interventions:     PT Goals Acute Rehab PT Goals Pt will go Sit to Supine/Side: with supervision PT Goal: Sit to Supine/Side - Progress: Progressing toward goal Pt will go Sit to Stand: with supervision PT Goal: Sit to Stand - Progress: Progressing toward goal Pt will go Stand to Sit: with supervision PT Goal: Stand to Sit - Progress: Progressing toward goal Pt will Ambulate: 51 - 150 feet;with supervision;with least restrictive assistive device PT Goal: Ambulate - Progress: Progressing toward goal Pt will Go Up / Down Stairs: 6-9 stairs;with min assist;with least restrictive assistive device PT Goal: Up/Down Stairs - Progress: Met  Visit Information  Last PT Received On: 01/25/13 Assistance Needed: +1    Subjective Data  Subjective: "I didnt sleep well. My back and neck is sore" Patient Stated Goal: less pain   Cognition  Overall Cognitive Status: Appears within functional limits for tasks assessed/performed Arousal/Alertness: Awake/alert Orientation Level: Appears intact for tasks assessed Behavior During Session: [facility] for tasks performed    Balance     End of Session PT - End of Session Equipment Utilized During Treatment: Gait belt;Right knee immobilizer Activity Tolerance: Patient tolerated treatment well Patient left: in bed;with call bell/phone within reach;with  family/visitor present   GP  [name] 01/25/2013, 12:13 PM [phone]

## 2013-02-13 ENCOUNTER — Ambulatory Visit: Payer: Medicare Other | Attending: Orthopedic Surgery | Admitting: Physical Therapy

## 2013-02-13 DIAGNOSIS — M25569 Pain in unspecified knee: Secondary | ICD-10-CM | POA: Insufficient documentation

## 2013-02-13 DIAGNOSIS — M25669 Stiffness of unspecified knee, not elsewhere classified: Secondary | ICD-10-CM | POA: Insufficient documentation

## 2013-02-13 DIAGNOSIS — R262 Difficulty in walking, not elsewhere classified: Secondary | ICD-10-CM | POA: Insufficient documentation

## 2013-02-13 DIAGNOSIS — IMO0001 Reserved for inherently not codable concepts without codable children: Secondary | ICD-10-CM | POA: Insufficient documentation

## 2013-02-17 ENCOUNTER — Ambulatory Visit: Payer: Medicare Other | Admitting: Physical Therapy

## 2013-02-19 ENCOUNTER — Ambulatory Visit: Payer: Medicare Other | Admitting: Physical Therapy

## 2013-02-21 ENCOUNTER — Ambulatory Visit: Payer: Medicare Other | Admitting: Physical Therapy

## 2013-02-24 ENCOUNTER — Ambulatory Visit: Payer: Medicare Other | Attending: Orthopedic Surgery | Admitting: Physical Therapy

## 2013-02-24 DIAGNOSIS — M25669 Stiffness of unspecified knee, not elsewhere classified: Secondary | ICD-10-CM | POA: Insufficient documentation

## 2013-02-24 DIAGNOSIS — R262 Difficulty in walking, not elsewhere classified: Secondary | ICD-10-CM | POA: Insufficient documentation

## 2013-02-24 DIAGNOSIS — M25569 Pain in unspecified knee: Secondary | ICD-10-CM | POA: Insufficient documentation

## 2013-02-24 DIAGNOSIS — IMO0001 Reserved for inherently not codable concepts without codable children: Secondary | ICD-10-CM | POA: Insufficient documentation

## 2013-02-26 ENCOUNTER — Ambulatory Visit: Payer: Medicare Other | Admitting: Physical Therapy

## 2013-02-28 ENCOUNTER — Ambulatory Visit: Payer: Medicare Other | Admitting: Physical Therapy

## 2013-03-03 ENCOUNTER — Ambulatory Visit: Payer: Medicare Other | Admitting: Physical Therapy

## 2013-03-05 ENCOUNTER — Ambulatory Visit: Payer: Medicare Other | Admitting: Physical Therapy

## 2013-03-07 ENCOUNTER — Ambulatory Visit: Payer: Medicare Other | Admitting: Physical Therapy

## 2013-03-10 ENCOUNTER — Ambulatory Visit: Payer: Medicare Other | Admitting: Physical Therapy

## 2013-03-12 ENCOUNTER — Ambulatory Visit: Payer: Medicare Other | Admitting: Physical Therapy

## 2013-03-14 ENCOUNTER — Ambulatory Visit: Payer: Medicare Other | Admitting: Physical Therapy

## 2013-03-18 ENCOUNTER — Ambulatory Visit: Payer: Medicare Other | Admitting: Physical Therapy

## 2013-03-20 ENCOUNTER — Ambulatory Visit: Payer: Medicare Other | Admitting: Physical Therapy

## 2013-03-24 ENCOUNTER — Ambulatory Visit: Payer: Medicare Other | Admitting: Physical Therapy

## 2013-03-28 ENCOUNTER — Ambulatory Visit: Payer: Medicare Other | Attending: Orthopedic Surgery | Admitting: Physical Therapy

## 2013-03-28 DIAGNOSIS — R262 Difficulty in walking, not elsewhere classified: Secondary | ICD-10-CM | POA: Insufficient documentation

## 2013-03-28 DIAGNOSIS — M25569 Pain in unspecified knee: Secondary | ICD-10-CM | POA: Insufficient documentation

## 2013-03-28 DIAGNOSIS — IMO0001 Reserved for inherently not codable concepts without codable children: Secondary | ICD-10-CM | POA: Insufficient documentation

## 2013-03-28 DIAGNOSIS — M25669 Stiffness of unspecified knee, not elsewhere classified: Secondary | ICD-10-CM | POA: Insufficient documentation

## 2013-03-31 ENCOUNTER — Ambulatory Visit: Payer: Medicare Other | Admitting: Physical Therapy

## 2013-04-03 ENCOUNTER — Ambulatory Visit: Payer: Medicare Other | Admitting: Physical Therapy

## 2013-04-07 ENCOUNTER — Ambulatory Visit: Payer: Medicare Other | Admitting: Physical Therapy

## 2013-04-08 ENCOUNTER — Ambulatory Visit: Payer: Medicare Other | Admitting: Physical Therapy

## 2013-04-14 ENCOUNTER — Ambulatory Visit: Payer: Medicare Other | Admitting: Physical Therapy

## 2013-04-17 ENCOUNTER — Ambulatory Visit: Payer: Medicare Other | Admitting: Physical Therapy

## 2013-04-21 ENCOUNTER — Ambulatory Visit: Payer: Medicare Other | Admitting: Physical Therapy

## 2013-04-25 ENCOUNTER — Ambulatory Visit: Payer: Medicare Other | Attending: Orthopedic Surgery | Admitting: Physical Therapy

## 2013-04-25 DIAGNOSIS — IMO0001 Reserved for inherently not codable concepts without codable children: Secondary | ICD-10-CM | POA: Insufficient documentation

## 2013-04-25 DIAGNOSIS — M25569 Pain in unspecified knee: Secondary | ICD-10-CM | POA: Insufficient documentation

## 2013-04-25 DIAGNOSIS — M25669 Stiffness of unspecified knee, not elsewhere classified: Secondary | ICD-10-CM | POA: Insufficient documentation

## 2013-04-25 DIAGNOSIS — R262 Difficulty in walking, not elsewhere classified: Secondary | ICD-10-CM | POA: Insufficient documentation

## 2013-05-01 ENCOUNTER — Ambulatory Visit: Payer: Medicare Other | Admitting: Physical Therapy

## 2013-05-05 ENCOUNTER — Ambulatory Visit: Payer: Medicare Other | Admitting: Physical Therapy

## 2013-05-09 ENCOUNTER — Ambulatory Visit: Payer: Medicare Other | Admitting: Physical Therapy

## 2013-05-12 ENCOUNTER — Ambulatory Visit: Payer: Medicare Other | Admitting: Physical Therapy

## 2013-05-15 ENCOUNTER — Other Ambulatory Visit: Payer: Self-pay

## 2013-05-15 ENCOUNTER — Ambulatory Visit: Payer: Medicare Other | Admitting: Physical Therapy

## 2013-05-15 DIAGNOSIS — Z1231 Encounter for screening mammogram for malignant neoplasm of breast: Secondary | ICD-10-CM

## 2013-05-21 ENCOUNTER — Ambulatory Visit: Payer: Medicare Other | Admitting: Physical Therapy

## 2013-05-26 ENCOUNTER — Ambulatory Visit: Payer: Medicare Other | Attending: Orthopedic Surgery | Admitting: Physical Therapy

## 2013-05-26 ENCOUNTER — Ambulatory Visit: Payer: Medicare Other | Admitting: Physical Therapy

## 2013-05-26 DIAGNOSIS — M25669 Stiffness of unspecified knee, not elsewhere classified: Secondary | ICD-10-CM | POA: Insufficient documentation

## 2013-05-26 DIAGNOSIS — M25569 Pain in unspecified knee: Secondary | ICD-10-CM | POA: Insufficient documentation

## 2013-05-26 DIAGNOSIS — IMO0001 Reserved for inherently not codable concepts without codable children: Secondary | ICD-10-CM | POA: Insufficient documentation

## 2013-05-26 DIAGNOSIS — R262 Difficulty in walking, not elsewhere classified: Secondary | ICD-10-CM | POA: Insufficient documentation

## 2013-05-28 ENCOUNTER — Ambulatory Visit: Payer: Medicare Other | Admitting: Physical Therapy

## 2013-06-05 ENCOUNTER — Ambulatory Visit: Payer: Medicare Other | Admitting: Physical Therapy

## 2013-06-20 ENCOUNTER — Ambulatory Visit
Admission: RE | Admit: 2013-06-20 | Discharge: 2013-06-20 | Disposition: A | Discharge status: Home / Self Care | Payer: Medicare Other | Source: Ambulatory Visit

## 2013-06-20 DIAGNOSIS — Z1231 Encounter for screening mammogram for malignant neoplasm of breast: Secondary | ICD-10-CM

## 2014-05-14 DIAGNOSIS — M159 Polyosteoarthritis, unspecified: Secondary | ICD-10-CM | POA: Insufficient documentation

## 2014-05-14 DIAGNOSIS — M069 Rheumatoid arthritis, unspecified: Secondary | ICD-10-CM | POA: Insufficient documentation

## 2014-06-16 ENCOUNTER — Other Ambulatory Visit: Payer: Self-pay

## 2014-06-16 DIAGNOSIS — Z1231 Encounter for screening mammogram for malignant neoplasm of breast: Secondary | ICD-10-CM

## 2014-06-23 ENCOUNTER — Ambulatory Visit
Admission: RE | Admit: 2014-06-23 | Discharge: 2014-06-23 | Disposition: A | Discharge status: Home / Self Care | Payer: Medicare Other | Source: Ambulatory Visit

## 2014-06-23 DIAGNOSIS — Z1231 Encounter for screening mammogram for malignant neoplasm of breast: Secondary | ICD-10-CM

## 2014-09-11 DIAGNOSIS — Z79899 Other long term (current) drug therapy: Secondary | ICD-10-CM | POA: Insufficient documentation

## 2015-02-18 ENCOUNTER — Ambulatory Visit: Payer: 59 | Admitting: Physical Therapy

## 2015-02-19 ENCOUNTER — Ambulatory Visit: Payer: Medicare Other | Attending: Physician Assistant | Admitting: Physical Therapy

## 2015-02-19 ENCOUNTER — Encounter: Payer: Self-pay | Admitting: Physical Therapy

## 2015-02-19 DIAGNOSIS — M25661 Stiffness of right knee, not elsewhere classified: Secondary | ICD-10-CM | POA: Insufficient documentation

## 2015-02-19 DIAGNOSIS — Z471 Aftercare following joint replacement surgery: Secondary | ICD-10-CM | POA: Diagnosis present

## 2015-02-19 DIAGNOSIS — Z96651 Presence of right artificial knee joint: Secondary | ICD-10-CM | POA: Insufficient documentation

## 2015-02-19 DIAGNOSIS — M25579 Pain in unspecified ankle and joints of unspecified foot: Secondary | ICD-10-CM | POA: Insufficient documentation

## 2015-02-19 DIAGNOSIS — Z96641 Presence of right artificial hip joint: Secondary | ICD-10-CM | POA: Diagnosis not present

## 2015-02-19 DIAGNOSIS — M79672 Pain in left foot: Secondary | ICD-10-CM

## 2015-02-19 NOTE — Therapy (Signed)
[place], [name], [address] Phone: [phone]   Fax:  [phone]  Physical Therapy Evaluation  Patient Details  Name: Kristen Hansen MRN: [id number] Date of Birth: Dec 25, 1946 Referring Provider:  Krystal Eaton, PA-C  Encounter Date: 02/19/2015      PT End of Session - 02/19/15 1005    Visit Number 1   Number of Visits 16   Date for PT Re-Evaluation 04/20/15   PT Start Time 0920   PT Stop Time 1030   PT Time Calculation (min) 70 min      Past Medical History  Diagnosis Date  . 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 Rheumatoid arthritis(714.0)   . Hypercholesteremia   . Hypertension   . GERD (gastroesophageal reflux disease)   . Headache(784.0)   . Osteoarthritis   . PONV (postoperative nausea and vomiting)     Past Surgical History  Procedure Laterality Date  . Total hip arthroplasty    . Abdominal hysterectomy    . Tonsillectomy    . Joint replacement      right uni knee, right hip with 2 revisions  . Total knee arthroplasty  01/22/2013    Procedure: TOTAL KNEE ARTHROPLASTY;  Surgeon: Gearlean Alf, MD;  Location: [facility];  Service: Orthopedics;  Laterality: Right;  Revision of a Right Uni Knee to a Total Knee Arthroplasty    There were no vitals taken for this visit.  Visit Diagnosis:  Knee stiffness, right - Plan: PT plan of care cert/re-cert  Left foot pain - Plan: PT plan of care cert/re-cert      Subjective Assessment - 02/19/15 0922    Symptoms Patient reports that she has right hip and knee pain.  Reports about 2 months ago she was having trouble getting into the car, also has to rest to go up stairs at home   Pertinent History she has a history of right TKR in 2014, reports having left foot pain and is going to have orthotics made   Limitations Standing;Walking;House hold activities   How long can you sit comfortably? 15 minutes   How long can you stand comfortably? 15 minutes    How long can you walk comfortably? 10 minutes   Diagnostic tests x-rays show scoliosis   Patient Stated Goals no pain and be able go up and down stairs   Currently in Pain? Yes   Pain Score 5    Pain Location Knee   Pain Orientation Right   Pain Descriptors / Indicators Aching;Constant   Pain Type Chronic pain   Pain Onset Other (comment)   Pain Frequency Constant   Aggravating Factors  walking and standing   Pain Relieving Factors rest   Effect of Pain on Daily Activities limited with stairs, standing and walking as well as ADL's   Multiple Pain Sites Yes   Pain Location Foot          [facility] PT Assessment - 02/19/15 0001    Assessment   Medical Diagnosis right kne and hip pain as well as foot pain   Onset Date 12/19/14   Next MD Visit w+1   Prior Therapy 2 years ago   Precautions   Precautions None   Balance Screen   Has the patient fallen in the past 6 months Yes   How many times? 3   Has the patient had a decrease in activity level because of a fear of falling?  No  Is the patient reluctant to leave their home because of a fear of falling?  No   Prior Function   Level of Independence --  independent, has stairs in the home and to get into the home   AROM   Right/Left Knee Right   Right Knee Extension 5   Right Knee Flexion 82   Left Ankle Dorsiflexion 5   Left Ankle Plantar Flexion 30   Left Ankle Inversion 10   Left Ankle Eversion 0   Flexibility   Soft Tissue Assessment /Muscle Length --  very tight ITB, calves and HS   Palpation   Palpation tender anterior right knee, lateral left foot and in the left low back and SI area   Special Tests    Special Tests --  very tight ITB   Ambulation/Gait   Gait Comments ambulates with a fwd flexed trunk slight lean to the left, antalgic or stiff on the right, has orthotics in both shoes with heel lift in the left   Balance   Balance Assessed --  unsteady on her feet especially with turning, tends to lean                    [facility] Adult PT Treatment/Exercise - 02/19/15 0001    Modalities   Modalities Electrical Stimulation;Moist Heat;Ultrasound   Moist Heat Therapy   Number Minutes Moist Heat 15 Minutes   Moist Heat Location Other (comment)   Electrical Stimulation   Electrical Stimulation Location left foot   Electrical Stimulation Parameters IFC   Electrical Stimulation Goals Pain   Ultrasound   Ultrasound Location left lateral foot   Ultrasound Goals Pain                PT Education - 02/19/15 1005    Education provided Yes   Education Details stretches for ITB, calves   Person(s) Educated Patient   Methods Explanation;Handout;Demonstration   Comprehension Verbalized understanding          PT Short Term Goals - 02/19/15 1008    PT SHORT TERM GOAL #1   Title independent with initial HEP   Time 2   Period Weeks   Status New           PT Long Term Goals - 02/19/15 1013    PT LONG TERM GOAL #1   Title go up stairs step over step without rest   Baseline has to rest and goes one at a time   Time 8   Period Weeks   Status New   PT LONG TERM GOAL #2   Title increase AROM of the right knee to 100 degrees flexion   Baseline currently 82 degrees flexion   Time 8   Period Weeks   Status New   PT LONG TERM GOAL #3   Title no falls over a 4 week period   Time 8   Period Weeks   Status New   PT LONG TERM GOAL #4   Title decrease pain 50%   Time 8   Period Weeks   Status New               Plan - 02/19/15 1006    Clinical Impression Statement Patient has c/o pain in the right knee, low back, and left foot, she has scoliosis and deficits with balance, has RA and neuropathy, very active person that likes to travel but has a very stiff knee now only 82 degrees flexion   Pt will  benefit from skilled therapeutic intervention in order to improve on the following deficits Abnormal gait;Decreased activity tolerance;Decreased balance;Decreased  endurance;Decreased coordination;Decreased mobility;Decreased range of motion;Difficulty walking;Decreased strength;Pain   Rehab Potential Good   PT Frequency 2x / week   PT Duration 8 weeks   PT Treatment/Interventions Ultrasound;Moist Heat;Electrical Stimulation;Stair training;Gait training;Therapeutic exercise;Balance training;Neuromuscular re-education;Functional mobility training;Patient/family education;Passive range of motion;Manual techniques   PT Next Visit Plan add gym activities, may try soft tissue work on ITB   Consulted and Agree with Plan of Care Patient          G-Codes - 2015-03-16 1024    Functional Assessment Tool Used FOTO   Functional Limitation Mobility: Walking and moving around   Mobility: Walking and Moving Around Current Status [phone]) At least 40 percent but less than 60 percent impaired, limited or restricted   Mobility: Walking and Moving Around Goal Status (Q7225) At least 40 percent but less than 60 percent impaired, limited or restricted       Problem List Patient Active Problem List   Diagnosis Date Noted  . Postop Hyponatremia 01/23/2013  . Postoperative anemia due to acute blood loss 01/23/2013  . Pain due to unicompartmental arthroplasty of knee 01/22/2013    Sumner Boast, PT Mar 16, 2015, 10:26 AM  [place], [name], [address] Phone: [phone]   Fax:  [phone]

## 2015-02-26 ENCOUNTER — Encounter: Payer: Self-pay | Admitting: Physical Therapy

## 2015-02-26 ENCOUNTER — Ambulatory Visit: Payer: Medicare Other | Attending: Orthopedic Surgery | Admitting: Physical Therapy

## 2015-02-26 DIAGNOSIS — Z96651 Presence of right artificial knee joint: Secondary | ICD-10-CM | POA: Diagnosis not present

## 2015-02-26 DIAGNOSIS — Z471 Aftercare following joint replacement surgery: Secondary | ICD-10-CM | POA: Insufficient documentation

## 2015-02-26 DIAGNOSIS — M25661 Stiffness of right knee, not elsewhere classified: Secondary | ICD-10-CM | POA: Diagnosis not present

## 2015-02-26 DIAGNOSIS — M79672 Pain in left foot: Secondary | ICD-10-CM

## 2015-02-26 DIAGNOSIS — M25579 Pain in unspecified ankle and joints of unspecified foot: Secondary | ICD-10-CM | POA: Diagnosis not present

## 2015-02-26 DIAGNOSIS — Z96641 Presence of right artificial hip joint: Secondary | ICD-10-CM | POA: Diagnosis not present

## 2015-02-26 NOTE — Therapy (Signed)
[place], [name], [address] Phone: [phone]   Fax:  [phone]  Physical Therapy Treatment  Patient Details  Name: Kristen Hansen MRN: [id number] Date of Birth: 02-18-1946 Referring Provider:  Krystal Eaton, PA-C  Encounter Date: 02/26/2015      PT End of Session - 02/26/15 0857    Visit Number 2   PT Start Time 0800   PT Stop Time 0900   PT Time Calculation (min) 60 min      Past Medical History  Diagnosis Date  . 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 Rheumatoid arthritis(714.0)   . Hypercholesteremia   . Hypertension   . GERD (gastroesophageal reflux disease)   . Headache(784.0)   . Osteoarthritis   . PONV (postoperative nausea and vomiting)     Past Surgical History  Procedure Laterality Date  . Total hip arthroplasty    . Abdominal hysterectomy    . Tonsillectomy    . Joint replacement      right uni knee, right hip with 2 revisions  . Total knee arthroplasty  01/22/2013    Procedure: TOTAL KNEE ARTHROPLASTY;  Surgeon: Gearlean Alf, MD;  Location: [facility];  Service: Orthopedics;  Laterality: Right;  Revision of a Right Uni Knee to a Total Knee Arthroplasty    There were no vitals taken for this visit.  Visit Diagnosis:  Left foot pain  Knee stiffness, right      Subjective Assessment - 02/26/15 0802    Symptoms left foot hurts , knee and hip gets worse as day goes on. Seeing Dr Tonita Cong this morning. HEP is going well.   Currently in Pain? Yes   Pain Score 4    Pain Location Foot   Pain Orientation Left   Pain Descriptors / Indicators Aching;Constant   Pain Type Chronic pain                    [facility] Adult PT Treatment/Exercise - 02/26/15 0001    Knee/Hip Exercises: Aerobic   Stationary Bike Nustep L 5 6 min   Knee/Hip Exercises: Supine   Hip Adduction Isometric --  ball squeeze with bride 15 times   Bridges --  bridge,KTC and obligues with ball 15 reps   Other  Supine Knee Exercises red tband hip abd 15 times   Modalities   Modalities Electrical Stimulation   Moist Heat Therapy   Number Minutes Moist Heat 15 Minutes   Moist Heat Location --  lumbar and Left foot   Electrical Stimulation   Electrical Stimulation Location left foot   Electrical Stimulation Parameters IFC   Electrical Stimulation Goals Pain   Ultrasound   Ultrasound Location Left foot   Ultrasound Parameters 72mz 1 w/cm2   Ultrasound Goals Pain   Manual Therapy   Manual Therapy --  PROM to bilateral HS,calf,ITB and lumbar. LE NT stretches                  PT Short Term Goals - 02/19/15 1008    PT SHORT TERM GOAL #1   Title independent with initial HEP   Time 2   Period Weeks   Status New           PT Long Term Goals - 02/19/15 1013    PT LONG TERM GOAL #1   Title go up stairs step over step without rest   Baseline has to rest and goes one at a  time   Time 8   Period Weeks   Status New   PT LONG TERM GOAL #2   Title increase AROM of the right knee to 100 degrees flexion   Baseline currently 82 degrees flexion   Time 8   Period Weeks   Status New   PT LONG TERM GOAL #3   Title no falls over a 4 week period   Time 8   Period Weeks   Status New   PT LONG TERM GOAL #4   Title decrease pain 50%   Time 8   Period Weeks   Status New               Plan - 02/26/15 0857    Clinical Impression Statement pt with fatigue with all exercise. very tight LE        Problem List Patient Active Problem List   Diagnosis Date Noted  . Postop Hyponatremia 01/23/2013  . Postoperative anemia due to acute blood loss 01/23/2013  . Pain due to unicompartmental arthroplasty of knee 01/22/2013    PAYSEUR,ANGIE,PTA 02/26/2015, 8:59 AM  [place], [name], [address] Phone: [phone]   Fax:  [phone]

## 2015-03-02 ENCOUNTER — Ambulatory Visit: Payer: Medicare Other | Admitting: Physical Therapy

## 2015-03-04 ENCOUNTER — Ambulatory Visit: Payer: Medicare Other | Admitting: Physical Therapy

## 2015-03-05 ENCOUNTER — Encounter: Payer: Self-pay | Admitting: Physical Therapy

## 2015-03-05 ENCOUNTER — Ambulatory Visit: Payer: Medicare Other | Admitting: Physical Therapy

## 2015-03-05 DIAGNOSIS — Z471 Aftercare following joint replacement surgery: Secondary | ICD-10-CM | POA: Diagnosis not present

## 2015-03-05 DIAGNOSIS — M79672 Pain in left foot: Secondary | ICD-10-CM

## 2015-03-05 DIAGNOSIS — M25661 Stiffness of right knee, not elsewhere classified: Secondary | ICD-10-CM

## 2015-03-05 DIAGNOSIS — M545 Low back pain, unspecified: Secondary | ICD-10-CM

## 2015-03-05 NOTE — Therapy (Signed)
[place], [name], [address] Phone: [phone]   Fax:  [phone]  Physical Therapy Evaluation  Patient Details  Name: Kristen Hansen MRN: [id number] Date of Birth: 1946-06-16 Referring Provider:  Gerrit Halls, PA-C  Encounter Date: 03/05/2015      PT End of Session - 03/05/15 1038    Visit Number 3   Number of Visits 16   Date for PT Re-Evaluation 04/20/15   PT Start Time 0930   PT Stop Time 1040   PT Time Calculation (min) 70 min   Activity Tolerance Patient tolerated treatment well      Past Medical History  Diagnosis Date  . 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 Rheumatoid arthritis(714.0)   . Hypercholesteremia   . Hypertension   . GERD (gastroesophageal reflux disease)   . Headache(784.0)   . Osteoarthritis   . PONV (postoperative nausea and vomiting)     Past Surgical History  Procedure Laterality Date  . Total hip arthroplasty    . Abdominal hysterectomy    . Tonsillectomy    . Joint replacement      right uni knee, right hip with 2 revisions  . Total knee arthroplasty  01/22/2013    Procedure: TOTAL KNEE ARTHROPLASTY;  Surgeon: Gearlean Alf, MD;  Location: [facility];  Service: Orthopedics;  Laterality: Right;  Revision of a Right Uni Knee to a Total Knee Arthroplasty    There were no vitals filed for this visit.  Visit Diagnosis:  Bilateral low back pain without sciatica - Plan: PT plan of care cert/re-cert  Knee stiffness, right - Plan: PT plan of care cert/re-cert  Left foot pain - Plan: PT plan of care cert/re-cert      Subjective Assessment - 03/05/15 0931    Symptoms Saw Dr. Tonita Cong on lst week, she has a new order for low back pain, as well as the left foot, left knee and right hip.  I will add evaluation of the low back today   Pertinent History she has a history of right TKR in 2014, reports having left foot pain and is going to have orthotics made   Limitations  Standing;Walking;House hold activities   How long can you sit comfortably? 15 minutes   How long can you stand comfortably? 15 minutes   How long can you walk comfortably? 10 minutes   Diagnostic tests x-rays show scoliosis   Patient Stated Goals no pain and be able go up and down stairs   Currently in Pain? Yes   Pain Score 7    Pain Location Knee   Pain Orientation Left   Pain Descriptors / Indicators Aching   Pain Type Chronic pain   Pain Onset Other (comment)   Pain Frequency Constant   Aggravating Factors  stretching and walking increase the pain   Pain Relieving Factors rest and pain meds help   Effect of Pain on Daily Activities limited with everything   Pain Location Foot   Pain Score 3   Pain Location Hip            [facility] PT Assessment - 03/05/15 0001    Assessment   Medical Diagnosis low back pain    Onset Date 02/26/15   Precautions   Precautions None   Posture/Postural Control   Posture Comments scoliosis with left rib hump during forward flexion   ROM / Strength   AROM / PROM / Strength AROM   AROM  AROM Assessment Site Lumbar   Lumbar Flexion decreased 75%   Lumbar Extension decreased 75%   Lumbar - Right Side Bend decreased 75%   Lumbar - Left Side Bend decreased 75%   Flexibility   Soft Tissue Assessment /Muscle Length --  she is very tight in the calves, hamstring, piriformis and I   Palpation   Palpation left lumbar paraspinals  are very tight and tender, some tenderness into the buttocks   Ambulation/Gait   Gait Comments tends to lean and go to the left with walking, some left foot toe drag                           PT Education - 2015-03-29 1037    Education provided Yes   Education Details Wms flexion and ball lumbar exercises in supine   Person(s) Educated Patient   Methods Explanation;Demonstration;Handout   Comprehension Verbalized understanding          PT Short Term Goals - 02/19/15 1008    PT SHORT TERM GOAL  #1   Title independent with initial HEP   Time 2   Period Weeks   Status New           PT Long Term Goals - 2015-03-29 1041    PT LONG TERM GOAL #5   Title decrease low back pain 50%   Time 8   Period Weeks   Status New               Plan - 03/29/2015 1039    Clinical Impression Statement Patient came in with referral to add back pain.  I performed evaluation of the back today.  Much of her problems really seem to stem from soft tissue and mm tightness.  She does have some scoliosis and issues with gait, a lot of compensation with gait.   Pt will benefit from skilled therapeutic intervention in order to improve on the following deficits Abnormal gait;Decreased activity tolerance;Decreased balance;Decreased endurance;Decreased coordination;Decreased mobility;Decreased range of motion;Difficulty walking;Decreased strength;Pain   Rehab Potential Good   PT Frequency 2x / week   PT Duration 8 weeks   PT Treatment/Interventions Ultrasound;Moist Heat;Electrical Stimulation;Stair training;Gait training;Therapeutic exercise;Balance training;Neuromuscular re-education;Functional mobility training;Patient/family education;Passive range of motion;Manual techniques   PT Next Visit Plan add gym activities, may try soft tissue work on ITB   PT [place] She now has diagnosis of left foot, knee pain, hip pain and back pain.  We will triage treat the pain and work to increase flexibility and core strength   Consulted and Agree with Plan of Care Patient          G-Codes - 03/29/15 1042    Functional Assessment Tool Used PT discretion   Functional Limitation Other PT primary   Other PT Primary Current Status (H0865) At least 40 percent but less than 60 percent impaired, limited or restricted   Other PT Primary Goal Status (H8469) At least 40 percent but less than 60 percent impaired, limited or restricted   Other PT Primary Discharge Status (G2952) At least 40 percent but less than  60 percent impaired, limited or restricted       Problem List Patient Active Problem List   Diagnosis Date Noted  . Postop Hyponatremia 01/23/2013  . Postoperative anemia due to acute blood loss 01/23/2013  . Pain due to unicompartmental arthroplasty of knee 01/22/2013    Sumner Boast, PT Mar 29, 2015, 10:46 AM  [place]  [place], [name], [address] Phone: [phone]   Fax:  [phone]

## 2015-03-09 ENCOUNTER — Ambulatory Visit: Payer: Medicare Other | Admitting: Physical Therapy

## 2015-03-12 ENCOUNTER — Ambulatory Visit: Payer: Medicare Other | Admitting: Physical Therapy

## 2015-03-18 ENCOUNTER — Ambulatory Visit: Payer: Medicare Other | Admitting: Physical Therapy

## 2015-03-18 ENCOUNTER — Encounter: Payer: Self-pay | Admitting: Physical Therapy

## 2015-03-18 DIAGNOSIS — M545 Low back pain, unspecified: Secondary | ICD-10-CM

## 2015-03-18 DIAGNOSIS — M79672 Pain in left foot: Secondary | ICD-10-CM

## 2015-03-18 DIAGNOSIS — M25661 Stiffness of right knee, not elsewhere classified: Secondary | ICD-10-CM

## 2015-03-18 DIAGNOSIS — Z471 Aftercare following joint replacement surgery: Secondary | ICD-10-CM | POA: Diagnosis not present

## 2015-03-18 NOTE — Therapy (Signed)
[place], [name], [address] Phone: [phone]   Fax:  [phone]  Physical Therapy Treatment  Patient Details  Name: Kristen Hansen MRN: [id number] Date of Birth: March 22, 1946 Referring Provider:  Gerrit Halls, PA-C  Encounter Date: 03/18/2015      PT End of Session - 03/18/15 1009    Visit Number 4   PT Start Time 3295   PT Stop Time 1884   PT Time Calculation (min) 65 min      Past Medical History  Diagnosis Date  . 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 Rheumatoid arthritis(714.0)   . Hypercholesteremia   . Hypertension   . GERD (gastroesophageal reflux disease)   . Headache(784.0)   . Osteoarthritis   . PONV (postoperative nausea and vomiting)     Past Surgical History  Procedure Laterality Date  . Total hip arthroplasty    . Abdominal hysterectomy    . Tonsillectomy    . Joint replacement      right uni knee, right hip with 2 revisions  . Total knee arthroplasty  01/22/2013    Procedure: TOTAL KNEE ARTHROPLASTY;  Surgeon: Gearlean Alf, MD;  Location: [facility];  Service: Orthopedics;  Laterality: Right;  Revision of a Right Uni Knee to a Total Knee Arthroplasty    There were no vitals filed for this visit.  Visit Diagnosis:  Bilateral low back pain without sciatica  Knee stiffness, right  Left foot pain      Subjective Assessment - 03/18/15 0929    Symptoms saw foot MD yesterday , he saws doing all the right things with PT and meds, only other option is to fuse ankle, just no energy and low endurance.   Currently in Pain? Yes   Pain Score 6    Pain Location Foot   Effect of Pain on Daily Activities pt has c/o pain in foot, knee, hip back and hands varies daily which is hurting worse- all arthritis                       [facility] Adult PT Treatment/Exercise - 03/18/15 0001    Lumbar Exercises: Aerobic   Stationary Bike Nustep L 4 6 min   Lumbar Exercises: Standing   Other Standing Lumbar Exercises ankle ROM, WBAT on left foot in standiing sit for 15 reps all ROM   Lumbar Exercises: Seated   Other Seated Lumbar Exercises knee add squeeze with ball hold 3 sec 2 set 10   Other Seated Lumbar Exercises hip abd green tband 2 sets 15   Lumbar Exercises: Supine   Bridge --  bridge, KTC and obl 15 times each   Large Ball Abdominal Isometric 15 reps;3 seconds   Knee/Hip Exercises: Supine   Knee Extension Strengthening;2 sets;10 reps  10#   Knee Flexion Strengthening;2 sets;10 reps  25#   Knee/Hip Exercises: Prone   Other Prone Exercises leg press 30 # 2 sets 10, calf raise 2 sets 10   Modalities   Modalities Electrical Stimulation   Moist Heat Therapy   Number Minutes Moist Heat 15 Minutes   Moist Heat Location --  lumbar. left knee,left foot   Electrical Stimulation   Electrical Stimulation Location --  left knee and lateral left foot   Electrical Stimulation Parameters premod   Electrical Stimulation Goals Pain   RUE Paraffin   Number Minutes Paraffin 15 Minutes   RUE Paraffin Location Hand  PT Education - 03/18/15 1008    Education provided Yes   Education Details discussed arthritis and need to strengthen to help support joints and decrease pain   Person(s) Educated Patient   Methods Explanation   Comprehension Verbalized understanding          PT Short Term Goals - 03/18/15 1010    PT SHORT TERM GOAL #1   Title independent with initial HEP   Status Achieved           PT Long Term Goals - 03/18/15 1010    PT LONG TERM GOAL #2   Title increase AROM of the right knee to 100 degrees flexion   Baseline currently 86 degrees flexion   Status On-going   PT LONG TERM GOAL #3   Title no falls over a 4 week period   Status On-going               Plan - 03/18/15 1009    Clinical Impression Statement pt tolerated ther ex with minimal increasre in pain but understood to strengthening, VCing needed for  correct technic and control of ther ex   PT Next Visit Plan progress strengthening and stretches        Problem List Patient Active Problem List   Diagnosis Date Noted  . Postop Hyponatremia 01/23/2013  . Postoperative anemia due to acute blood loss 01/23/2013  . Pain due to unicompartmental arthroplasty of knee 01/22/2013    ,ANGIE PTA 03/18/2015, 10:11 AM  [place], [name], [address] Phone: [phone]   Fax:  [phone]

## 2015-03-18 NOTE — Patient Instructions (Signed)
Discussed purchasing home paraffin

## 2015-03-22 ENCOUNTER — Telehealth: Payer: Self-pay | Admitting: Neurology

## 2015-03-22 ENCOUNTER — Encounter: Payer: Self-pay | Admitting: Physical Therapy

## 2015-03-22 ENCOUNTER — Ambulatory Visit: Payer: Medicare Other | Admitting: Physical Therapy

## 2015-03-22 DIAGNOSIS — Z471 Aftercare following joint replacement surgery: Secondary | ICD-10-CM | POA: Diagnosis not present

## 2015-03-22 DIAGNOSIS — M545 Low back pain, unspecified: Secondary | ICD-10-CM

## 2015-03-22 DIAGNOSIS — M79672 Pain in left foot: Secondary | ICD-10-CM

## 2015-03-22 DIAGNOSIS — M25661 Stiffness of right knee, not elsewhere classified: Secondary | ICD-10-CM

## 2015-03-22 NOTE — Telephone Encounter (Signed)
Attempted to contact pt. at cell #--vm was full, unable to leave message.  Home phone rang without being answered, no ans. machine/fim

## 2015-03-22 NOTE — Telephone Encounter (Signed)
Patient requesting refill for Rx lamoTRIgine (LAMICTAL) 100 MG tablet, going out of town early a.m. on 3/29. Explained to patient appt was needed prior to Rx being refilled, pt scheduled appt on 4/5.  Patient very upset that she may not be able to go on trip due to not having medication.  Please call and advise.

## 2015-03-22 NOTE — Therapy (Signed)
[place], [name], [address] Phone: [phone]   Fax:  [phone]  Physical Therapy Treatment  Patient Details  Name: Kristen Hansen MRN: [id number] Date of Birth: 06-03-1946 Referring Provider:  Gerrit Halls, PA-C  Encounter Date: 03/22/2015      PT End of Session - 03/22/15 1008    Visit Number 5   Number of Visits 16   Date for PT Re-Evaluation 04/20/15   PT Start Time 0935   PT Stop Time 1020   PT Time Calculation (min) 45 min   Activity Tolerance Patient tolerated treatment well      Past Medical History  Diagnosis Date  . 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 Rheumatoid arthritis(714.0)   . Hypercholesteremia   . Hypertension   . GERD (gastroesophageal reflux disease)   . Headache(784.0)   . Osteoarthritis   . PONV (postoperative nausea and vomiting)     Past Surgical History  Procedure Laterality Date  . Total hip arthroplasty    . Abdominal hysterectomy    . Tonsillectomy    . Joint replacement      right uni knee, right hip with 2 revisions  . Total knee arthroplasty  01/22/2013    Procedure: TOTAL KNEE ARTHROPLASTY;  Surgeon: Gearlean Alf, MD;  Location: [facility];  Service: Orthopedics;  Laterality: Right;  Revision of a Right Uni Knee to a Total Knee Arthroplasty    There were no vitals filed for this visit.  Visit Diagnosis:  Bilateral low back pain without sciatica  Knee stiffness, right  Left foot pain      Subjective Assessment - 03/22/15 0933    Symptoms Hurting this morning.   Pain Score 4    Pain Location Foot   Multiple Pain Sites Yes   Multiple Pain Sites Yes   Pain Location Hand   Pain Score 4   Pain Location Back                       [facility] Adult PT Treatment/Exercise - 03/22/15 0001    Exercises   Exercises Ankle;Lumbar   Lumbar Exercises: Aerobic   Stationary Bike Nustep level 5 x 6 minutes   Lumbar Exercises: Standing   Other  Standing Lumbar Exercises modified hip ext  2x10   Modalities   Modalities Electrical Stimulation;Paraffin;Moist Heat   Moist Heat Therapy   Number Minutes Moist Heat 15 Minutes   Moist Heat Location Other (comment)  lumbar   Electrical Stimulation   Electrical Stimulation Location low back and left knee   Electrical Stimulation Parameters premod   Electrical Stimulation Goals Pain   RUE Paraffin   Number Minutes Paraffin 15 Minutes   RUE Paraffin Location Hand   Ankle Exercises: Standing   Heel Raises 15 reps  2 sets   Ankle Exercises: Seated   Other Seated Ankle Exercises 10# DF 2x10  bilateral                  PT Short Term Goals - 03/18/15 1010    PT SHORT TERM GOAL #1   Title independent with initial HEP   Status Achieved           PT Long Term Goals - 03/18/15 1010    PT LONG TERM GOAL #2   Title increase AROM of the right knee to 100 degrees flexion   Baseline currently 86 degrees flexion   Status On-going  PT LONG TERM GOAL #3   Title no falls over a 4 week period   Status On-going               Plan - 03/22/15 1006    Clinical Impression Statement Decreased strength in RLE.  Patient compensates with LLE.   Pt will benefit from skilled therapeutic intervention in order to improve on the following deficits Abnormal gait;Decreased activity tolerance;Decreased balance;Decreased endurance;Decreased coordination;Decreased mobility;Decreased range of motion;Difficulty walking;Decreased strength;Pain   Rehab Potential Good   PT Frequency 2x / week   PT Duration 8 weeks   PT Treatment/Interventions Ultrasound;Moist Heat;Electrical Stimulation;Stair training;Gait training;Therapeutic exercise;Balance training;Neuromuscular re-education;Functional mobility training;Patient/family education;Passive range of motion;Manual techniques   PT Next Visit Plan Strengthen bilateral LE's individually.   Consulted and Agree with Plan of Care Patient         Problem List Patient Active Problem List   Diagnosis Date Noted  . Postop Hyponatremia 01/23/2013  . Postoperative anemia due to acute blood loss 01/23/2013  . Pain due to unicompartmental arthroplasty of knee 01/22/2013    , PTA 03/22/2015, 10:09 AM  [place], [name], [address] Phone: [phone]   Fax:  [phone]

## 2015-03-25 ENCOUNTER — Ambulatory Visit: Payer: Medicare Other | Admitting: Physical Therapy

## 2015-03-26 NOTE — Telephone Encounter (Signed)
Pt. has not returned my calls.  Will wait for her to contact our office again/fim

## 2015-03-29 ENCOUNTER — Encounter: Payer: Self-pay | Admitting: Physical Therapy

## 2015-03-29 ENCOUNTER — Ambulatory Visit: Payer: Medicare Other | Attending: Orthopedic Surgery | Admitting: Physical Therapy

## 2015-03-29 DIAGNOSIS — M79672 Pain in left foot: Secondary | ICD-10-CM

## 2015-03-29 DIAGNOSIS — Z96651 Presence of right artificial knee joint: Secondary | ICD-10-CM | POA: Diagnosis not present

## 2015-03-29 DIAGNOSIS — M25661 Stiffness of right knee, not elsewhere classified: Secondary | ICD-10-CM | POA: Diagnosis not present

## 2015-03-29 DIAGNOSIS — Z471 Aftercare following joint replacement surgery: Secondary | ICD-10-CM | POA: Insufficient documentation

## 2015-03-29 DIAGNOSIS — Z96641 Presence of right artificial hip joint: Secondary | ICD-10-CM | POA: Diagnosis not present

## 2015-03-29 DIAGNOSIS — M25579 Pain in unspecified ankle and joints of unspecified foot: Secondary | ICD-10-CM | POA: Insufficient documentation

## 2015-03-29 DIAGNOSIS — M545 Low back pain, unspecified: Secondary | ICD-10-CM

## 2015-03-29 NOTE — Therapy (Signed)
[place], [name], [address] Phone: [phone]   Fax:  [phone]  Physical Therapy Treatment  Patient Details  Name: Kristen Hansen MRN: [id number] Date of Birth: 04-22-1946 Referring Provider:  Gaynelle Arabian, MD  Encounter Date: 03/29/2015      PT End of Session - 03/29/15 1610    Visit Number 6   Date for PT Re-Evaluation 04/20/15   PT Start Time 9798   PT Stop Time 1600   PT Time Calculation (min) 73 min      Past Medical History  Diagnosis Date  . 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 Rheumatoid arthritis(714.0)   . Hypercholesteremia   . Hypertension   . GERD (gastroesophageal reflux disease)   . Headache(784.0)   . Osteoarthritis   . PONV (postoperative nausea and vomiting)     Past Surgical History  Procedure Laterality Date  . Total hip arthroplasty    . Abdominal hysterectomy    . Tonsillectomy    . Joint replacement      right uni knee, right hip with 2 revisions  . Total knee arthroplasty  01/22/2013    Procedure: TOTAL KNEE ARTHROPLASTY;  Surgeon: Gearlean Alf, MD;  Location: [facility];  Service: Orthopedics;  Laterality: Right;  Revision of a Right Uni Knee to a Total Knee Arthroplasty    There were no vitals filed for this visit.  Visit Diagnosis:  Bilateral low back pain without sciatica  Knee stiffness, right  Left foot pain      Subjective Assessment - 03/29/15 1447    Subjective Hip, back and hand pain today, drove a lot over the weekend                       [facility] Adult PT Treatment/Exercise - 03/29/15 0001    High Level Balance   High Level Balance Activities Side stepping;Backward walking   High Level Balance Comments resisted gait   Lumbar Exercises: Stretches   Passive Hamstring Stretch 20 seconds;4 reps   Single Knee to Chest Stretch 10 seconds;4 reps   Lower Trunk Rotation 10 seconds;4 reps   ITB Stretch 20 seconds;5 reps   Lumbar Exercises:  Aerobic   Stationary Bike Nustep level 5 x 6 minutes   Lumbar Exercises: Machines for Strengthening   Cybex Lumbar Extension 10#   Cybex Knee Extension 5#   Cybex Knee Flexion 25#   Other Lumbar Machine Exercise seated row 15#   Other Lumbar Machine Exercise lats 20#   Lumbar Exercises: Seated   Other Seated Lumbar Exercises knee add squeeze with ball hold 3 sec 2 set 10   Other Seated Lumbar Exercises hip abd green tband 2 sets 15   Lumbar Exercises: Supine   Clam 20 reps;2 seconds   Bridge 10 reps;2 seconds   Large Ball Abdominal Isometric 15 reps;3 seconds   Lumbar Exercises: Sidelying   Clam 10 reps;2 seconds   Lumbar Exercises: Prone   Opposite Arm/Leg Raise 10 reps;2 seconds   Lumbar Exercises: Quadruped   [place]/Old Horse 15 reps   Knee/Hip Exercises: Machines for Strengthening   Cybex Knee Extension 5#   Moist Heat Therapy   Number Minutes Moist Heat 15 Minutes   Moist Heat Location --  back   Electrical Stimulation   Electrical Stimulation Location low back and left knee   Electrical Stimulation Parameters premod   Electrical Stimulation Goals Pain   RUE Paraffin  Number Minutes Paraffin 15 Minutes   RUE Paraffin Location Hand   Manual Therapy   Other Manual Therapy taped left ankle in a posterior tibialis relief position and arch support                  PT Short Term Goals - 03/18/15 1010    PT SHORT TERM GOAL #1   Title independent with initial HEP   Status Achieved           PT Long Term Goals - 03/18/15 1010    PT LONG TERM GOAL #2   Title increase AROM of the right knee to 100 degrees flexion   Baseline currently 86 degrees flexion   Status On-going   PT LONG TERM GOAL #3   Title no falls over a 4 week period   Status On-going               Plan - 03/29/15 1611    Clinical Impression Statement patient with a myriad of complaints, knee, foot, back, hip and hand   Pt will benefit from skilled therapeutic intervention in  order to improve on the following deficits Abnormal gait;Decreased activity tolerance;Decreased balance;Decreased endurance;Decreased coordination;Decreased mobility;Decreased range of motion;Difficulty walking;Decreased strength;Pain   PT Frequency 2x / week   PT Duration 6 weeks   PT Treatment/Interventions Ultrasound;Moist Heat;Electrical Stimulation;Stair training;Gait training;Therapeutic exercise;Balance training;Neuromuscular re-education;Functional mobility training;Patient/family education;Passive range of motion;Manual techniques   PT Next Visit Plan continue to work on [place] and core   Consulted and Agree with Plan of Care Patient        Problem List Patient Active Problem List   Diagnosis Date Noted  . Postop Hyponatremia 01/23/2013  . Postoperative anemia due to acute blood loss 01/23/2013  . Pain due to unicompartmental arthroplasty of knee 01/22/2013    Sumner Boast, PT 03/29/2015, 4:12 PM  [place], [name], [address] Phone: [phone]   Fax:  [phone]

## 2015-03-30 ENCOUNTER — Encounter: Payer: Self-pay | Admitting: Neurology

## 2015-03-30 ENCOUNTER — Ambulatory Visit (INDEPENDENT_AMBULATORY_CARE_PROVIDER_SITE_OTHER): Payer: Medicare Other | Admitting: Neurology

## 2015-03-30 VITALS — BP 144/88 | HR 78 | Resp 14 | Ht 69.5 in | Wt 171.0 lb

## 2015-03-30 DIAGNOSIS — G4752 REM sleep behavior disorder: Secondary | ICD-10-CM | POA: Insufficient documentation

## 2015-03-30 DIAGNOSIS — G500 Trigeminal neuralgia: Secondary | ICD-10-CM | POA: Diagnosis not present

## 2015-03-30 DIAGNOSIS — M159 Polyosteoarthritis, unspecified: Secondary | ICD-10-CM | POA: Diagnosis not present

## 2015-03-30 HISTORY — DX: Trigeminal neuralgia: G50.0

## 2015-03-30 MED ORDER — CLONAZEPAM 0.5 MG PO TABS: 0.5000 mg | ORAL_TABLET | Freq: Every evening | ORAL | Status: DC

## 2015-03-30 MED ORDER — LAMOTRIGINE 100 MG PO TABS: 100.0000 mg | ORAL_TABLET | Freq: Two times a day (BID) | ORAL | Status: DC

## 2015-03-30 MED ORDER — OXYCODONE HCL 5 MG PO TABS: 5.0000 mg | ORAL_TABLET | ORAL | Status: DC | PRN

## 2015-03-30 MED ORDER — IMIPRAMINE HCL 25 MG PO TABS: 25.0000 mg | ORAL_TABLET | Freq: Every day | ORAL | Status: DC

## 2015-03-30 MED ORDER — OXCARBAZEPINE 300 MG PO TABS: 300.0000 mg | ORAL_TABLET | Freq: Three times a day (TID) | ORAL | Status: DC

## 2015-03-30 NOTE — Progress Notes (Addendum)
[facility]  PATIENT: Kristen Hansen DOB: 10/15/1946  REFERRING DOCTOR OR PCP:  Reita Cliche SOURCE: patient  _________________________________   HISTORICAL  CHIEF COMPLAINT:  Chief Complaint  Patient presents with  . Trigeminal Neuralgia    Sts. left sided facial pain is stable-no exacerbations in over one yr.  She has decreased Lamictal to 50mg  bid.  Sts. she has also decreased Clonazepam to .25mg  qhs to help with dry mouth.  Sts. husband tells her she moves more and talks in her sleep since decreasing Clonazepam./fim    HISTORY OF PRESENT ILLNESS:  Kristen Hansen is a 69 yo woman who was diagnosed with Trigeminal neuralgia about 12 years ago.   Pain is located on the lef in teh V2 distribution.   Stress and being nervous will cause an itching sensation in the left forehead but not trigger the worse V2 distribution pain.   Pain is increased by the hot shower hitting her face.   Oxcarbazapine helped when it was started initially and she has continued on it.    Last year, she had an exacerbation with many spells of electric like pain and Lamictal was added.    She has not needed any opiates in many months.      Arthritis:   She also has hand and hip arthritis and takes Tylenol with mild benefit.   She has not really tried NSAID.   She does PT with some benefit  Sleep/RBD:   She has disturbed sleep and was talking and hitting and thrashing.   With clonazepam, she does much better but RBD is not completely resolved  --- still talks and moves but nothing violent.    She now takes only 1/2 pill of clonazepam and thinks it does as well as one pill.     She denies tremor, rigidity.   She has mild memory concerns and word finding difficulty but no definite cognitive issue.   Gait has worsened slightly in last 2 years with poor balance and 2-3 falls.    REVIEW OF SYSTEMS: Constitutional: No fevers, chills, sweats, or change in appetite Eyes: No visual changes, double  vision, eye pain Ear, nose and throat: No hearing loss, ear pain, nasal congestion, sore throat Cardiovascular: No chest pain, palpitations Respiratory: No shortness of breath at rest or with exertion.   No wheezes GastrointestinaI: Has constipation.   No nausea, vomiting, diarrhea, abdominal pain, fecal incontinence Genitourinary: No dysuria, urinary retention or frequency.  No nocturia. Musculoskeletal:as above Integumentary: No rash, pruritus, skin lesions Neurological: as above Psychiatric: No depression at this time.  No anxiety Endocrine: No palpitations, diaphoresis, change in appetite, change in weigh or increased thirst Hematologic/Lymphatic: No anemia, purpura, petechiae. Allergic/Immunologic: No itchy/runny eyes, nasal congestion, recent allergic reactions, rashes  ALLERGIES: Allergies  Allergen Reactions  . Morphine And Related Hives  . Prevacid [Lansoprazole] Other (See Comments)    unknown  . Provigil [Modafinil]     dizzyness  . Adhesive [Tape] Rash    HOME MEDICATIONS:  Current outpatient prescriptions:  .  acetaminophen (TYLENOL) 650 MG CR tablet, Take 1,300 mg by mouth every 8 (eight) hours as needed. Pain, Disp: , Rfl:  .  amLODipine-olmesartan (AZOR) 5-40 MG per tablet, Take 1 tablet by mouth every evening. , Disp: , Rfl:  .  clonazePAM (KLONOPIN) 0.5 MG tablet, Take 0.5 mg by mouth every evening. , Disp: , Rfl:  .  hydroxypropyl methylcellulose (ISOPTO TEARS) 2.5 % ophthalmic solution, Place 1 drop into both  eyes as needed. Dry eyes, Disp: , Rfl:  .  imipramine (TOFRANIL) 25 MG tablet, Take 25 mg by mouth at bedtime., Disp: , Rfl:  .  lamoTRIgine (LAMICTAL) 100 MG tablet, Take 100 mg by mouth 2 (two) times daily., Disp: , Rfl:  .  loratadine (CLARITIN) 10 MG tablet, Take 10 mg by mouth daily., Disp: , Rfl:  .  Oxcarbazepine (TRILEPTAL) 300 MG tablet, Take 300-600 mg by mouth 3 (three) times daily. 1 in the morning 1 at noon and 2 at bedtime, Disp: , Rfl:    .  pravastatin (PRAVACHOL) 40 MG tablet, Take 40 mg by mouth daily before breakfast. , Disp: , Rfl:  .  sennosides-docusate sodium (SENOKOT-S) 8.6-50 MG tablet, Take 2 tablets by mouth every evening. , Disp: , Rfl:  .  enoxaparin (LOVENOX) 30 MG/0.3ML injection, Inject 0.3 mLs (30 mg total) into the skin 2 (two) times daily. Continue Lovenox injections for 10 days, then the patient may discontinue the Lovenox injections. After stopping the Lovenox injections, the patient may resume her 81 mg Aspirin daily. (Patient not taking: Reported on 03/30/2015), Disp: 10 Syringe, Rfl: 0 .  ezetimibe (ZETIA) 10 MG tablet, Take 10 mg by mouth daily before breakfast. , Disp: , Rfl:  .  methocarbamol (ROBAXIN) 500 MG tablet, Take 1 tablet (500 mg total) by mouth every 6 (six) hours as needed. (Patient not taking: Reported on 03/30/2015), Disp: 80 tablet, Rfl: 0 .  omeprazole (PRILOSEC) 20 MG capsule, Take 20 mg by mouth 2 (two) times daily. , Disp: , Rfl:  .  oxyCODONE (OXY IR/ROXICODONE) 5 MG immediate release tablet, Take 1-2 tablets (5-10 mg total) by mouth every 3 (three) hours as needed. (Patient not taking: Reported on 03/30/2015), Disp: 90 tablet, Rfl: 0  PAST MEDICAL HISTORY: Past Medical History  Diagnosis Date  . 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 Rheumatoid arthritis(714.0)   . Hypercholesteremia   . Hypertension   . GERD (gastroesophageal reflux disease)   . Headache(784.0)   . Osteoarthritis   . PONV (postoperative nausea and vomiting)   . Trigeminal neuralgia 03/30/2015  . Vision abnormalities     PAST SURGICAL HISTORY: Past Surgical History  Procedure Laterality Date  . Total hip arthroplasty    . Abdominal hysterectomy    . Tonsillectomy    . Joint replacement      right uni knee, right hip with 2 revisions  . Total knee arthroplasty  01/22/2013    Procedure: TOTAL KNEE ARTHROPLASTY;  Surgeon: Gearlean Alf, MD;  Location: [facility];  Service: Orthopedics;  Laterality: Right;  Revision of a Right Uni Knee  to a Total Knee Arthroplasty    FAMILY HISTORY: Family History  Problem Relation Age of Onset  . Heart disease Mother   . Parkinson's disease Father     SOCIAL HISTORY:  History   Social History  . Marital Status: Married    Spouse Name: N/A  . Number of Children: N/A  . Years of Education: N/A   Occupational History  . Not on file.   Social History Main Topics  . Smoking status: Never Smoker   . Smokeless tobacco: Never Used  . Alcohol Use: Yes     Comment: occasional  . Drug Use: No  . Sexual Activity: Not on file   Other Topics Concern  . Not on file   Social History Narrative     PHYSICAL EXAM  Filed Vitals:   03/30/15 1311  BP: 144/88  Pulse: 78  Resp: 14  Height: 5' 9.5" (1.765 m)  Weight: 171 lb (77.565 kg)    Body mass index is 24.9 kg/(m^2).   General: The patient is well-developed and well-nourished and in no acute distress  Neck: The neck is supple, no carotid bruits are noted.  The neck is nontender.  Cardiovascular: The heart has a regular rate and rhythm with a normal S1 and S2. There were no murmurs, gallops or rubs. Lungs are clear to auscultation.  Skin: Extremities are without significant edema.  Musculoskeletal:  Back is nontender  Neurologic Exam  Mental status: The patient is alert and oriented x 3 at the time of the examination. The patient has apparent normal recent and remote memory, with an apparently normal attention span and concentration ability.   Speech is normal.  Cranial nerves: Extraocular movements are full. Pupils are equal, round, and reactive to light and accomodation.  Visual fields are full.  Facial symmetry is present. There is good facial sensation to soft touch bilaterally.Facial strength is normal.  Trapezius and sternocleidomastoid strength is normal. No dysarthria is noted.  The tongue is midline, and the patient has symmetric elevation of the soft palate. No obvious hearing deficits are noted.  Motor:   Muscle bulk is normal.   Tone is normal. Strength is  5 / 5 in all 4 extremities.   Sensory: Sensory testing is intact to pinprick, soft touch and vibration sensation in all 4 extremities.  Coordination: Cerebellar testing reveals good finger-nose-finger and heel-to-shin bilaterally.  Gait and station: Station is normal.   Gait is normal. 3 step 180 degree turn.  She has 2 steps retropulsion when gait disturbed.   Tandem gait is mildly wide. Romberg is negative.   Reflexes: Deep tendon reflexes are symmetric and normal bilaterally.      DIAGNOSTIC DATA (LABS, IMAGING, TESTING) - I reviewed patient records, labs, notes, testing and imaging myself where available.  Lab Results  Component Value Date   WBC 7.1 01/25/2013   HGB 9.0* 01/25/2013   HCT 26.5* 01/25/2013   MCV 90.8 01/25/2013   PLT 194 01/25/2013      Component Value Date/Time   NA 131* 01/24/2013 0410   K 3.7 01/24/2013 0410   CL 98 01/24/2013 0410   CO2 25 01/24/2013 0410   GLUCOSE 123* 01/24/2013 0410   BUN 9 01/24/2013 0410   CREATININE 0.67 01/24/2013 0410   CALCIUM 8.2* 01/24/2013 0410   PROT 7.4 01/16/2013 1435   ALBUMIN 3.4* 01/16/2013 1435   AST 13 01/16/2013 1435   ALT 12 01/16/2013 1435   ALKPHOS 118* 01/16/2013 1435   BILITOT 0.1* 01/16/2013 1435   GFRNONAA 90* 01/24/2013 0410   GFRAA >90 01/24/2013 0410       ASSESSMENT AND PLAN  Trigeminal neuralgia  Generalized OA  REM sleep behavior disorder   In summary, Kristen Hansen 40 year old woman with a 12 year history of trigeminal neuralgia. This has fluctuated quite a bit over the years and she is currently doing fairly well. She tolerates oxcarbazepine and Lamictal fairly well.   She will continue on her current dose and increased by one pill if she has another flurry of trigeminal neuralgia events. She also has REM behavior disorder that is partially controlled on low-dose clonazepam. We had a long discussion about RBD and that about half the  patients have this in combination with a degenerative disorder such as Lewy body disease or Parkinson disease. Although with Parkinson disease, the REM behavior disorder is usually  concurrent, with Lewy body disease the REM behavior disorder may proceed other symptoms by 10 or more years. I am a little concerned that she has some retropulsion on examination. However, she has no increased muscle tone. She has mild memory difficulties but this is definitely not in a dementia level. We discussed that if she worsens over the next 6-12 months I would consider having her get formal neuropsychiatric testing to better assess the possibility of a mild dementia and to also consider Sinemet to help with the gait issues.  Her arthritic issues are stable. Will continue physical therapy.  She will return to see me in 6 months or sooner if she has new worsening neurologic symptoms.    A. Felecia Shelling, MD, PhD 0/06/2181, 8:83 PM Certified in Neurology, Clinical Neurophysiology, Sleep Medicine, Pain Medicine and Neuroimaging  [facility] [address] [place], [place] [address] [phone]

## 2015-04-01 ENCOUNTER — Ambulatory Visit: Payer: Medicare Other | Admitting: Physical Therapy

## 2015-04-02 ENCOUNTER — Ambulatory Visit: Payer: Medicare Other | Admitting: Physical Therapy

## 2015-04-02 ENCOUNTER — Encounter: Payer: Self-pay | Admitting: Physical Therapy

## 2015-04-02 DIAGNOSIS — M545 Low back pain, unspecified: Secondary | ICD-10-CM

## 2015-04-02 DIAGNOSIS — M25661 Stiffness of right knee, not elsewhere classified: Secondary | ICD-10-CM

## 2015-04-02 DIAGNOSIS — Z471 Aftercare following joint replacement surgery: Secondary | ICD-10-CM | POA: Diagnosis not present

## 2015-04-02 NOTE — Therapy (Signed)
[place] [facility] [place], [name], [address] Phone: [phone]   Fax:  [phone]  Physical Therapy Treatment  Patient Details  Name: Kristen Hansen MRN: [id number] Date of Birth: 08/16/1946 Referring Provider:  Gaynelle Arabian, MD  Encounter Date: 04/02/2015      PT End of Session - 04/02/15 1109    Visit Number 7   Number of Visits 16   PT Start Time 1020   PT Stop Time 1130   PT Time Calculation (min) 70 min      Past Medical History  Diagnosis Date  . 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 Rheumatoid arthritis(714.0)   . Hypercholesteremia   . Hypertension   . GERD (gastroesophageal reflux disease)   . Headache(784.0)   . Osteoarthritis   . PONV (postoperative nausea and vomiting)   . Trigeminal neuralgia 03/30/2015  . Vision abnormalities     Past Surgical History  Procedure Laterality Date  . Total hip arthroplasty    . Abdominal hysterectomy    . Tonsillectomy    . Joint replacement      right uni knee, right hip with 2 revisions  . Total knee arthroplasty  01/22/2013    Procedure: TOTAL KNEE ARTHROPLASTY;  Surgeon: Gearlean Alf, MD;  Location: [facility];  Service: Orthopedics;  Laterality: Right;  Revision of a Right Uni Knee to a Total Knee Arthroplasty    There were no vitals filed for this visit.  Visit Diagnosis:  Bilateral low back pain without sciatica  Knee stiffness, right      Subjective Assessment - 04/02/15 1048    Subjective saw Dr Maureen Ralphs yesterday and everything looked good, i feel like when I fatiue and my gait changes it affects everything. Pt bought paraffin for home use            [facility] PT Assessment - 04/02/15 0001    AROM   Right/Left Knee Right   Right Knee Extension 3   Right Knee Flexion 96   Left Ankle Dorsiflexion 10                   [facility] Adult PT Treatment/Exercise - 04/02/15 1056    Knee/Hip Exercises: Plyometrics   Other Plyometric Exercises resistve  gait                PT Education - 04/02/15 1109    Education provided Yes   Person(s) Educated Patient   Comprehension Verbalized understanding          PT Short Term Goals - 03/18/15 1010    PT SHORT TERM GOAL #1   Title independent with initial HEP   Status Achieved           PT Long Term Goals - 04/02/15 1110    PT LONG TERM GOAL #1   Title go up stairs step over step without rest   Status On-going   PT LONG TERM GOAL #2   Title increase AROM of the right knee to 100 degrees flexion   Status On-going   PT LONG TERM GOAL #3   Title no falls over a 4 week period   Baseline fall last week did not clear log in woods   Status On-going   PT LONG TERM GOAL #4   Title decrease pain 50%   Status On-going   PT LONG TERM GOAL #5   Title decrease low back pain 50%   Status On-going  Plan - 04/02/15 1109    Clinical Impression Statement pt frustrated with so many specailist and so many issues, again stressed need to strengthen muscles to support joints. Decreased balance with activities, requires assistance.        Problem List Patient Active Problem List   Diagnosis Date Noted  . Trigeminal neuralgia 03/30/2015  . 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 Polypharmacy 09/11/2014  . Arthritis or polyarthritis, rheumatoid 05/14/2014  . Generalized OA 05/14/2014  . Postop Hyponatremia 01/23/2013  . Postoperative anemia due to acute blood loss 01/23/2013  . Pain due to unicompartmental arthroplasty of knee 01/22/2013    PAYSEUR,ANGIE PTA 04/02/2015, 11:12 AM  [place], [name], [address] Phone: [phone]   Fax:  [phone]

## 2015-04-05 ENCOUNTER — Ambulatory Visit: Payer: Medicare Other | Admitting: Physical Therapy

## 2015-04-05 ENCOUNTER — Encounter: Payer: Self-pay | Admitting: Physical Therapy

## 2015-04-05 DIAGNOSIS — M545 Low back pain, unspecified: Secondary | ICD-10-CM

## 2015-04-05 DIAGNOSIS — M25661 Stiffness of right knee, not elsewhere classified: Secondary | ICD-10-CM

## 2015-04-05 DIAGNOSIS — Z471 Aftercare following joint replacement surgery: Secondary | ICD-10-CM | POA: Diagnosis not present

## 2015-04-05 DIAGNOSIS — M79672 Pain in left foot: Secondary | ICD-10-CM

## 2015-04-05 NOTE — Therapy (Signed)
[place], [name], [address] Phone: [phone]   Fax:  [phone]  Physical Therapy Treatment  Patient Details  Name: Kristen Hansen MRN: [id number] Date of Birth: [birth year] Referring Provider:  Gaynelle Arabian, MD  Encounter Date: 04/05/2015      PT End of Session - 04/05/15 1439    Visit Number 8   Date for PT Re-Evaluation 04/20/15   PT Start Time 7353   PT Stop Time 1500   PT Time Calculation (min) 58 min   Activity Tolerance Patient tolerated treatment well      Past Medical History  Diagnosis Date  . 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 Rheumatoid arthritis(714.0)   . Hypercholesteremia   . Hypertension   . GERD (gastroesophageal reflux disease)   . Headache(784.0)   . Osteoarthritis   . PONV (postoperative nausea and vomiting)   . Trigeminal neuralgia 03/30/2015  . Vision abnormalities     Past Surgical History  Procedure Laterality Date  . Total hip arthroplasty    . Abdominal hysterectomy    . Tonsillectomy    . Joint replacement      right uni knee, right hip with 2 revisions  . Total knee arthroplasty  01/22/2013    Procedure: TOTAL KNEE ARTHROPLASTY;  Surgeon: Gearlean Alf, MD;  Location: [facility];  Service: Orthopedics;  Laterality: Right;  Revision of a Right Uni Knee to a Total Knee Arthroplasty    There were no vitals filed for this visit.  Visit Diagnosis:  Bilateral low back pain without sciatica  Left foot pain  Knee stiffness, right      Subjective Assessment - 04/05/15 1402    Subjective I rested over the weekend, I feel pretty good today, left foot pain   Pertinent History she has a history of right TKR in 2014, reports having left foot pain and is going to have orthotics made   Patient Stated Goals no pain and be able go up and down stairs   Currently in Pain? Yes   Pain Score 4    Pain Location Foot   Pain Orientation Left   Pain Descriptors / Indicators Aching    Pain Type Chronic pain                       [facility] Adult PT Treatment/Exercise - 04/05/15 0001    Ambulation/Gait   Ambulation Surface Outdoor   Gait Comments walked 500 feet was short of breath   Lumbar Exercises: Aerobic   Elliptical Nustep L 5 6 min   Lumbar Exercises: Standing   Other Standing Lumbar Exercises airx func balance, hip 3 way, heel raise and mini squats   Lumbar Exercises: Seated   Other Seated Lumbar Exercises knee add squeeze with ball hold 3 sec 2 set 10   Other Seated Lumbar Exercises hip abd green tband 2 sets 15   Lumbar Exercises: Sidelying   Clam 10 reps;2 seconds   Knee/Hip Exercises: Stretches   Passive Hamstring Stretch 20 seconds;3 reps   Hip Flexor Stretch 3 reps;20 seconds   ITB Stretch 3 reps;20 seconds   Gastroc Stretch 3 reps;20 seconds   Knee/Hip Exercises: Machines for Strengthening   Cybex Knee Extension 5#   Cybex Knee Flexion 25# 2x15   Knee/Hip Exercises: Plyometrics   Other Plyometric Exercises resisted gait all directions, tandem gait   Moist Heat Therapy   Number Minutes Moist Heat 15 Minutes  Moist Heat Location Other (comment)  left foot   Electrical Stimulation   Electrical Stimulation Location left foot   Electrical Stimulation Parameters premod   Electrical Stimulation Goals Pain   Manual Therapy   Manual Therapy Other (comment)   Other Manual Therapy taped left ankle in a posterior tibialis relief position and arch support                  PT Short Term Goals - 03/18/15 1010    PT SHORT TERM GOAL #1   Title independent with initial HEP   Status Achieved           PT Long Term Goals - 04/02/15 1110    PT LONG TERM GOAL #1   Title go up stairs step over step without rest   Status On-going   PT LONG TERM GOAL #2   Title increase AROM of the right knee to 100 degrees flexion   Status On-going   PT LONG TERM GOAL #3   Title no falls over a 4 week period   Baseline fall last week did not  clear log in woods   Status On-going   PT LONG TERM GOAL #4   Title decrease pain 50%   Status On-going   PT LONG TERM GOAL #5   Title decrease low back pain 50%   Status On-going               Plan - 04/05/15 1440    Clinical Impression Statement Doing better today, better gait pattern with less compensation   Pt will benefit from skilled therapeutic intervention in order to improve on the following deficits Abnormal gait;Decreased activity tolerance;Decreased balance;Decreased endurance;Decreased coordination;Decreased mobility;Decreased range of motion;Difficulty walking;Decreased strength;Pain   Rehab Potential Good   PT Frequency 2x / week   PT Duration 4 weeks   PT Treatment/Interventions Ultrasound;Moist Heat;Electrical Stimulation;Stair training;Gait training;Therapeutic exercise;Balance training;Neuromuscular re-education;Functional mobility training;Patient/family education;Passive range of motion;Manual techniques   PT Next Visit Plan continue to work on [place] and core   Consulted and Agree with Plan of Care Patient        Problem List Patient Active Problem List   Diagnosis Date Noted  . Trigeminal neuralgia 03/30/2015  . 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 Polypharmacy 09/11/2014  . Arthritis or polyarthritis, rheumatoid 05/14/2014  . Generalized OA 05/14/2014  . Postop Hyponatremia 01/23/2013  . Postoperative anemia due to acute blood loss 01/23/2013  . Pain due to unicompartmental arthroplasty of knee 01/22/2013    Sumner Boast, PT 04/05/2015, 2:42 PM  [place], [name], [address] Phone: [phone]   Fax:  [phone]

## 2015-04-08 ENCOUNTER — Encounter: Payer: Self-pay | Admitting: Physical Therapy

## 2015-04-08 ENCOUNTER — Ambulatory Visit: Payer: Medicare Other | Admitting: Physical Therapy

## 2015-04-08 DIAGNOSIS — M545 Low back pain, unspecified: Secondary | ICD-10-CM

## 2015-04-08 DIAGNOSIS — Z471 Aftercare following joint replacement surgery: Secondary | ICD-10-CM | POA: Diagnosis not present

## 2015-04-08 DIAGNOSIS — M25661 Stiffness of right knee, not elsewhere classified: Secondary | ICD-10-CM

## 2015-04-08 DIAGNOSIS — M79672 Pain in left foot: Secondary | ICD-10-CM

## 2015-04-08 NOTE — Therapy (Signed)
[place], [name], [address] Phone: [phone]   Fax:  [phone]  Physical Therapy Treatment  Patient Details  Name: Kristen Hansen MRN: [id number] Date of Birth: [birth year] Referring Provider:  Gaynelle Arabian, MD  Encounter Date: 04/08/2015      PT End of Session - 04/08/15 1449    Visit Number 9   Number of Visits 16   Date for PT Re-Evaluation 04/20/15   PT Start Time 1017   PT Stop Time 1508   PT Time Calculation (min) 65 min   Activity Tolerance Patient tolerated treatment well      Past Medical History  Diagnosis Date  . 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 Rheumatoid arthritis(714.0)   . Hypercholesteremia   . Hypertension   . GERD (gastroesophageal reflux disease)   . Headache(784.0)   . Osteoarthritis   . PONV (postoperative nausea and vomiting)   . Trigeminal neuralgia 03/30/2015  . Vision abnormalities     Past Surgical History  Procedure Laterality Date  . Total hip arthroplasty    . Abdominal hysterectomy    . Tonsillectomy    . Joint replacement      right uni knee, right hip with 2 revisions  . Total knee arthroplasty  01/22/2013    Procedure: TOTAL KNEE ARTHROPLASTY;  Surgeon: Gearlean Alf, MD;  Location: [facility];  Service: Orthopedics;  Laterality: Right;  Revision of a Right Uni Knee to a Total Knee Arthroplasty    There were no vitals filed for this visit.  Visit Diagnosis:  Bilateral low back pain without sciatica  Left foot pain  Knee stiffness, right      Subjective Assessment - 04/08/15 1409    Subjective Foot feels really good with the tape.  My right hip hurst most today   Patient Stated Goals no pain and be able go up and down stairs   Currently in Pain? Yes   Pain Score 4    Pain Location Hip   Pain Orientation Right   Pain Descriptors / Indicators Aching   Pain Type Chronic pain   Pain Onset Other (comment)   Aggravating Factors  wlking   Pain  Relieving Factors the tape really helps   Effect of Pain on Daily Activities limited with activity, very short of breath with activity                       [facility] Adult PT Treatment/Exercise - 04/08/15 0001    Ambulation/Gait   Ambulation Surface Outdoor   Gait Comments 600+ feet, no rest and minimal deviation   High Level Balance   High Level Balance Activities Side stepping;Backward walking   Lumbar Exercises: Stretches   Passive Hamstring Stretch 20 seconds;4 reps   Lower Trunk Rotation 10 seconds;4 reps   ITB Stretch 20 seconds;5 reps   Lumbar Exercises: Aerobic   Elliptical Nustep L 5 6 min   Lumbar Exercises: Machines for Strengthening   Cybex Knee Flexion 25#   Other Lumbar Machine Exercise seated row 20# 2 sets 15   Other Lumbar Machine Exercise lats 20# 2 sets 15   Lumbar Exercises: Seated   Other Seated Lumbar Exercises knee add squeeze with ball hold 3 sec 2 set 10   Lumbar Exercises: Sidelying   Clam 10 reps;2 seconds   Knee/Hip Exercises: Stretches   Passive Hamstring Stretch 20 seconds;3 reps   Hip Flexor Stretch 3 reps;20 seconds  Gastroc Stretch 3 reps;20 seconds   Moist Heat Therapy   Number Minutes Moist Heat 15 Minutes   Moist Heat Location Other (comment)   Electrical Stimulation   Electrical Stimulation Location right lateral thigh   Electrical Stimulation Parameters IFC   Electrical Stimulation Goals Pain   Manual Therapy   Manual Therapy Other (comment)   Other Manual Therapy taped left ankle in a posterior tibialis relief position and arch support                  PT Short Term Goals - 03/18/15 1010    PT SHORT TERM GOAL #1   Title independent with initial HEP   Status Achieved           PT Long Term Goals - 04/02/15 1110    PT LONG TERM GOAL #1   Title go up stairs step over step without rest   Status On-going   PT LONG TERM GOAL #2   Title increase AROM of the right knee to 100 degrees flexion   Status  On-going   PT LONG TERM GOAL #3   Title no falls over a 4 week period   Baseline fall last week did not clear log in woods   Status On-going   PT LONG TERM GOAL #4   Title decrease pain 50%   Status On-going   PT LONG TERM GOAL #5   Title decrease low back pain 50%   Status On-going               Plan - 04/08/15 1449    Clinical Impression Statement did great with gait around building, still some pain and weakness, fatigues easily   PT Next Visit Plan continue to work on stength of LE's and core   Consulted and Agree with Plan of Care Patient        Problem List Patient Active Problem List   Diagnosis Date Noted  . Trigeminal neuralgia 03/30/2015  . 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 Polypharmacy 09/11/2014  . Arthritis or polyarthritis, rheumatoid 05/14/2014  . Generalized OA 05/14/2014  . Postop Hyponatremia 01/23/2013  . Postoperative anemia due to acute blood loss 01/23/2013  . Pain due to unicompartmental arthroplasty of knee 01/22/2013    Sumner Boast, PT 04/08/2015, 2:51 PM  [place], [name], [address] Phone: [phone]   Fax:  [phone]

## 2015-04-12 ENCOUNTER — Ambulatory Visit: Payer: Medicare Other | Admitting: Physical Therapy

## 2015-04-12 ENCOUNTER — Encounter: Payer: Self-pay | Admitting: Physical Therapy

## 2015-04-12 DIAGNOSIS — M545 Low back pain, unspecified: Secondary | ICD-10-CM

## 2015-04-12 DIAGNOSIS — M25661 Stiffness of right knee, not elsewhere classified: Secondary | ICD-10-CM

## 2015-04-12 DIAGNOSIS — M79672 Pain in left foot: Secondary | ICD-10-CM

## 2015-04-12 DIAGNOSIS — Z471 Aftercare following joint replacement surgery: Secondary | ICD-10-CM | POA: Diagnosis not present

## 2015-04-12 NOTE — Therapy (Signed)
[place], [name], [address] Phone: [phone]   Fax:  [phone]  Physical Therapy Treatment  Patient Details  Name: Kristen Hansen MRN: [id number] Date of Birth: Apr 30, 1946 Referring Provider:  Gaynelle Arabian, MD  Encounter Date: 04/12/2015      PT End of Session - 04/12/15 1448    Visit Number 10   Number of Visits 16   Date for PT Re-Evaluation 04/20/15   PT Start Time 1400   PT Stop Time 1503   PT Time Calculation (min) 63 min      Past Medical History  Diagnosis Date  . 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 Rheumatoid arthritis(714.0)   . Hypercholesteremia   . Hypertension   . GERD (gastroesophageal reflux disease)   . Headache(784.0)   . Osteoarthritis   . PONV (postoperative nausea and vomiting)   . Trigeminal neuralgia 03/30/2015  . Vision abnormalities     Past Surgical History  Procedure Laterality Date  . Total hip arthroplasty    . Abdominal hysterectomy    . Tonsillectomy    . Joint replacement      right uni knee, right hip with 2 revisions  . Total knee arthroplasty  01/22/2013    Procedure: TOTAL KNEE ARTHROPLASTY;  Surgeon: Gearlean Alf, MD;  Location: [facility];  Service: Orthopedics;  Laterality: Right;  Revision of a Right Uni Knee to a Total Knee Arthroplasty    There were no vitals filed for this visit.  Visit Diagnosis:  Bilateral low back pain without sciatica  Left foot pain  Knee stiffness, right      Subjective Assessment - 04/12/15 1402    Subjective I feel pretty good today.   Pertinent History she has a history of right TKR in 2014, reports having left foot pain and is going to have orthotics made   Patient Stated Goals no pain and be able go up and down stairs   Currently in Pain? Yes   Pain Score 2    Pain Location Hip   Pain Orientation Right   Pain Descriptors / Indicators Aching   Pain Type Chronic pain   Pain Onset Other (comment)   Pain  Frequency Constant                         [facility] Adult PT Treatment/Exercise - 04/12/15 0001    Ambulation/Gait   Ambulation Surface Outdoor   Gait Comments 600+ feet, no rest and minimal deviation   High Level Balance   High Level Balance Activities Side stepping;Backward walking   High Level Balance Comments on [employer] ball toss, head turns and eyes closed   Lumbar Exercises: Stretches   Passive Hamstring Stretch 20 seconds;4 reps   ITB Stretch 20 seconds;5 reps   Lumbar Exercises: Aerobic   Elliptical Nustep L 5 6 min   Lumbar Exercises: Machines for Strengthening   Cybex Knee Extension 5#   Cybex Knee Flexion 25#   Other Lumbar Machine Exercise seated row 20# 2 sets 15   Lumbar Exercises: Standing   Other Standing Lumbar Exercises modified hip ext   Lumbar Exercises: Seated   Other Seated Lumbar Exercises knee add squeeze with ball hold 3 sec 2 set 10   Knee/Hip Exercises: Supine   Other Supine Knee Exercises feet on ball KTC, trunk rotation, bridge and iso abdominal squeeze   Moist Heat Therapy   Number Minutes Moist Heat  15 Minutes   Moist Heat Location Other (comment)   Electrical Stimulation   Electrical Stimulation Location right lateral thigh   Electrical Stimulation Parameters IFC   Electrical Stimulation Goals Pain   Manual Therapy   Manual Therapy Other (comment)   Other Manual Therapy taped left ankle in a posterior tibialis relief position and arch support                  PT Short Term Goals - 03/18/15 1010    PT SHORT TERM GOAL #1   Title independent with initial HEP   Status Achieved           PT Long Term Goals - 04/02/15 1110    PT LONG TERM GOAL #1   Title go up stairs step over step without rest   Status On-going   PT LONG TERM GOAL #2   Title increase AROM of the right knee to 100 degrees flexion   Status On-going   PT LONG TERM GOAL #3   Title no falls over a 4 week period   Baseline fall last week did not clear  log in woods   Status On-going   PT LONG TERM GOAL #4   Title decrease pain 50%   Status On-going   PT LONG TERM GOAL #5   Title decrease low back pain 50%   Status On-going               Plan - 04/26/2015 1448    Clinical Impression Statement Doing som much better iwth walking, has difficulty with eyes closed for balance, also difficulty getting up from sitting if not using the hands   Pt will benefit from skilled therapeutic intervention in order to improve on the following deficits Abnormal gait;Decreased activity tolerance;Decreased balance;Decreased endurance;Decreased coordination;Decreased mobility;Decreased range of motion;Difficulty walking;Decreased strength;Pain   PT Next Visit Plan continue to work on [place] [employer] and Agree with Plan of Care Patient          G-Codes - 04-26-15 1504    Functional Assessment Tool Used FOTO   Mobility: Walking and Moving Around Current Status [phone]) At least 40 percent but less than 60 percent impaired, limited or restricted   Mobility: Walking and Moving Around Goal Status (U0454) At least 40 percent but less than 60 percent impaired, limited or restricted      Problem List Patient Active Problem List   Diagnosis Date Noted  . Trigeminal neuralgia 03/30/2015  . 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 Polypharmacy 09/11/2014  . Arthritis or polyarthritis, rheumatoid 05/14/2014  . Generalized OA 05/14/2014  . Postop Hyponatremia 01/23/2013  . Postoperative anemia due to acute blood loss 01/23/2013  . Pain due to unicompartmental arthroplasty of knee 01/22/2013    Sumner Boast, PT 2015-04-26, 3:05 PM  [place] [address] [place], [name], [address] Phone: [phone]   Fax:  [phone]

## 2015-04-15 ENCOUNTER — Ambulatory Visit: Payer: Medicare Other | Admitting: Physical Therapy

## 2015-04-15 ENCOUNTER — Encounter: Payer: Self-pay | Admitting: Physical Therapy

## 2015-04-15 DIAGNOSIS — Z471 Aftercare following joint replacement surgery: Secondary | ICD-10-CM | POA: Diagnosis not present

## 2015-04-15 DIAGNOSIS — M545 Low back pain, unspecified: Secondary | ICD-10-CM

## 2015-04-15 DIAGNOSIS — M25661 Stiffness of right knee, not elsewhere classified: Secondary | ICD-10-CM

## 2015-04-15 DIAGNOSIS — M79672 Pain in left foot: Secondary | ICD-10-CM

## 2015-04-15 NOTE — Therapy (Signed)
[place], [name], [address] Phone: [phone]   Fax:  [phone]  Physical Therapy Treatment  Patient Details  Name: Kristen Hansen MRN: [id number] Date of Birth: [birth year] Referring Provider:  Gaynelle Arabian, MD  Encounter Date: 04/15/2015      PT End of Session - 04/15/15 1605    Visit Number 11   Date for PT Re-Evaluation 04/20/15   PT Start Time 6226   PT Stop Time 1620   PT Time Calculation (min) 50 min      Past Medical History  Diagnosis Date  . 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 Rheumatoid arthritis(714.0)   . Hypercholesteremia   . Hypertension   . GERD (gastroesophageal reflux disease)   . Headache(784.0)   . Osteoarthritis   . PONV (postoperative nausea and vomiting)   . Trigeminal neuralgia 03/30/2015  . Vision abnormalities     Past Surgical History  Procedure Laterality Date  . Total hip arthroplasty    . Abdominal hysterectomy    . Tonsillectomy    . Joint replacement      right uni knee, right hip with 2 revisions  . Total knee arthroplasty  01/22/2013    Procedure: TOTAL KNEE ARTHROPLASTY;  Surgeon: Gearlean Alf, MD;  Location: [facility];  Service: Orthopedics;  Laterality: Right;  Revision of a Right Uni Knee to a Total Knee Arthroplasty    There were no vitals filed for this visit.  Visit Diagnosis:  Bilateral low back pain without sciatica  Left foot pain  Knee stiffness, right      Subjective Assessment - 04/15/15 1531    Subjective I did a lot of yardwork and I am really sore and stiff all over.   Currently in Pain? Yes   Pain Score 5    Pain Location Back  c/o pain in the hip, ankle and hand as well   Pain Orientation Lower   Pain Descriptors / Indicators Aching   Pain Type Chronic pain   Pain Onset Other (comment)   Aggravating Factors  yardwork   Pain Relieving Factors heat and rest                         [facility] Adult PT  Treatment/Exercise - 04/15/15 0001    Lumbar Exercises: Stretches   Passive Hamstring Stretch 20 seconds;4 reps   Lower Trunk Rotation 10 seconds;4 reps   ITB Stretch 20 seconds;5 reps   Lumbar Exercises: Aerobic   Elliptical Nustep L 5 6 min   Lumbar Exercises: Supine   Clam 20 reps;2 seconds   Large Ball Abdominal Isometric 15 reps;3 seconds   Knee/Hip Exercises: Stretches   Passive Hamstring Stretch 20 seconds;3 reps   Hip Flexor Stretch 3 reps;20 seconds   Gastroc Stretch 3 reps;20 seconds   Knee/Hip Exercises: Supine   Other Supine Knee Exercises feet on ball KTC, trunk rotation, bridge and iso abdominal squeeze   Electrical Stimulation   Electrical Stimulation Location right lateral thigh   Electrical Stimulation Parameters IFC   Electrical Stimulation Goals Pain   Ultrasound   Ultrasound Location right knee   Ultrasound Parameters 1MHz 100%   Ultrasound Goals Pain   RUE [place]   Number Minutes [place] 15 Minutes   RUE [place] Location Hand                  PT Short Term Goals - 03/18/15 1010  PT SHORT TERM GOAL #1   Title independent with initial HEP   Status Achieved           PT Long Term Goals - 04/02/15 1110    PT LONG TERM GOAL #1   Title go up stairs step over step without rest   Status On-going   PT LONG TERM GOAL #2   Title increase AROM of the right knee to 100 degrees flexion   Status On-going   PT LONG TERM GOAL #3   Title no falls over a 4 week period   Baseline fall last week did not clear log in woods   Status On-going   PT LONG TERM GOAL #4   Title decrease pain 50%   Status On-going   PT LONG TERM GOAL #5   Title decrease low back pain 50%   Status On-going               Plan - 04/15/15 1606    Clinical Impression Statement Patient will be driving 9 hours this weekend on a single day, went over car exercises and stretches   PT Next Visit Plan continue to work on stength of LE's and core   Consulted and Agree  with Plan of Care Patient        Problem List Patient Active Problem List   Diagnosis Date Noted  . Trigeminal neuralgia 03/30/2015  . 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 Polypharmacy 09/11/2014  . Arthritis or polyarthritis, rheumatoid 05/14/2014  . Generalized OA 05/14/2014  . Postop Hyponatremia 01/23/2013  . Postoperative anemia due to acute blood loss 01/23/2013  . Pain due to unicompartmental arthroplasty of knee 01/22/2013    Sumner Boast, PT 04/15/2015, 4:44 PM  [place], [name], [address] Phone: [phone]   Fax:  [phone]

## 2015-04-23 ENCOUNTER — Ambulatory Visit: Payer: Medicare Other | Admitting: Physical Therapy

## 2015-05-03 ENCOUNTER — Ambulatory Visit: Payer: Medicare Other | Attending: Orthopedic Surgery | Admitting: Physical Therapy

## 2015-05-03 ENCOUNTER — Encounter: Payer: Self-pay | Admitting: Physical Therapy

## 2015-05-03 DIAGNOSIS — M25661 Stiffness of right knee, not elsewhere classified: Secondary | ICD-10-CM | POA: Insufficient documentation

## 2015-05-03 DIAGNOSIS — Z471 Aftercare following joint replacement surgery: Secondary | ICD-10-CM | POA: Insufficient documentation

## 2015-05-03 DIAGNOSIS — M25579 Pain in unspecified ankle and joints of unspecified foot: Secondary | ICD-10-CM | POA: Insufficient documentation

## 2015-05-03 DIAGNOSIS — Z96641 Presence of right artificial hip joint: Secondary | ICD-10-CM | POA: Diagnosis not present

## 2015-05-03 DIAGNOSIS — M79672 Pain in left foot: Secondary | ICD-10-CM

## 2015-05-03 DIAGNOSIS — M545 Low back pain, unspecified: Secondary | ICD-10-CM

## 2015-05-03 DIAGNOSIS — Z96651 Presence of right artificial knee joint: Secondary | ICD-10-CM | POA: Insufficient documentation

## 2015-05-03 NOTE — Therapy (Signed)
[place], [name], [address] Phone: [phone]   Fax:  [phone]  Physical Therapy Treatment  Patient Details  Name: Kristen Hansen MRN: [id number] Date of Birth: 1946/08/18 Referring Provider:  Gaynelle Arabian, MD  Encounter Date: 05/03/2015      PT End of Session - 05/03/15 1149    Visit Number 12   PT Start Time 2951   PT Stop Time 1115   PT Time Calculation (min) 60 min      Past Medical History  Diagnosis Date  . 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 Rheumatoid arthritis(714.0)   . Hypercholesteremia   . Hypertension   . GERD (gastroesophageal reflux disease)   . Headache(784.0)   . Osteoarthritis   . PONV (postoperative nausea and vomiting)   . Trigeminal neuralgia 03/30/2015  . Vision abnormalities     Past Surgical History  Procedure Laterality Date  . Total hip arthroplasty    . Abdominal hysterectomy    . Tonsillectomy    . Joint replacement      right uni knee, right hip with 2 revisions  . Total knee arthroplasty  01/22/2013    Procedure: TOTAL KNEE ARTHROPLASTY;  Surgeon: Gearlean Alf, MD;  Location: [facility];  Service: Orthopedics;  Laterality: Right;  Revision of a Right Uni Knee to a Total Knee Arthroplasty    There were no vitals filed for this visit.  Visit Diagnosis:  Bilateral low back pain without sciatica - Plan: PT plan of care cert/re-cert  Left foot pain - Plan: PT plan of care cert/re-cert      Subjective Assessment - 05/03/15 1019    Subjective Still hurting in the foot, hip and hand   Currently in Pain? Yes   Pain Score 4    Pain Location Back  c/o foot and hand pain   Pain Orientation Lower   Pain Descriptors / Indicators Aching   Pain Type Chronic pain   Aggravating Factors  activity   Pain Relieving Factors heat                         [facility] Adult PT Treatment/Exercise - 05/03/15 0001    Ambulation/Gait   Gait Comments 700+ feet, no  rest and minimal deviation   Lumbar Exercises: Stretches   Passive Hamstring Stretch 20 seconds;4 reps   Hip Flexor Stretch 3 reps;20 seconds   ITB Stretch 20 seconds;5 reps   Lumbar Exercises: Aerobic   Elliptical Nustep L 5 6 min   Lumbar Exercises: Machines for Strengthening   Cybex Knee Extension 5#   Cybex Knee Flexion 25#   Other Lumbar Machine Exercise seated row 20# 2 sets 15   Other Lumbar Machine Exercise Lats 20#, obliques 5#, hip extension and abduction 5#   Lumbar Exercises: Supine   Clam 20 reps;2 seconds   Large Ball Abdominal Isometric 15 reps;3 seconds   Electrical Stimulation   Electrical Stimulation Location low back and hip   Electrical Stimulation Parameters IFC   Electrical Stimulation Goals Pain                  PT Short Term Goals - 03/18/15 1010    PT SHORT TERM GOAL #1   Title independent with initial HEP   Status Achieved           PT Long Term Goals - 04/02/15 1110    PT LONG TERM GOAL #1  Title go up stairs step over step without rest   Status On-going   PT LONG TERM GOAL #2   Title increase AROM of the right knee to 100 degrees flexion   Status On-going   PT LONG TERM GOAL #3   Title no falls over a 4 week period   Baseline fall last week did not clear log in woods   Status On-going   PT LONG TERM GOAL #4   Title decrease pain 50%   Status On-going   PT LONG TERM GOAL #5   Title decrease low back pain 50%   Status On-going               Plan - 05/03/15 1150    Clinical Impression Statement Continuew to improve by showing less difficulty with walking and less shortness of breath   PT Next Visit Plan continue to work on stength of LE's and [employer] and Agree with Plan of Care Patient        Problem List Patient Active Problem List   Diagnosis Date Noted  . Trigeminal neuralgia 03/30/2015  . 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 Polypharmacy 09/11/2014  . Arthritis or polyarthritis, rheumatoid  05/14/2014  . Generalized OA 05/14/2014  . Postop Hyponatremia 01/23/2013  . Postoperative anemia due to acute blood loss 01/23/2013  . Pain due to unicompartmental arthroplasty of knee 01/22/2013    Sumner Boast, PT 05/03/2015, 11:54 AM  [place], [name], [address] Phone: [phone]   Fax:  [phone]

## 2015-05-10 ENCOUNTER — Encounter: Payer: Self-pay | Admitting: Physical Therapy

## 2015-05-10 ENCOUNTER — Ambulatory Visit: Payer: Medicare Other | Admitting: Physical Therapy

## 2015-05-10 DIAGNOSIS — M79672 Pain in left foot: Secondary | ICD-10-CM

## 2015-05-10 DIAGNOSIS — M25661 Stiffness of right knee, not elsewhere classified: Secondary | ICD-10-CM

## 2015-05-10 DIAGNOSIS — Z471 Aftercare following joint replacement surgery: Secondary | ICD-10-CM | POA: Diagnosis not present

## 2015-05-10 DIAGNOSIS — M545 Low back pain, unspecified: Secondary | ICD-10-CM

## 2015-05-10 NOTE — Therapy (Signed)
[place], [name], [address] Phone: [phone]   Fax:  [phone]  Physical Therapy Treatment  Patient Details  Name: Kristen Hansen MRN: [id number] Date of Birth: May 11, 1946 Referring Provider:  Gaynelle Arabian, MD  Encounter Date: 05/10/2015      PT End of Session - 05/10/15 1358    Visit Number 13   PT Start Time 2774   PT Stop Time 1419   PT Time Calculation (min) 61 min      Past Medical History  Diagnosis Date  . 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 Rheumatoid arthritis(714.0)   . Hypercholesteremia   . Hypertension   . GERD (gastroesophageal reflux disease)   . Headache(784.0)   . Osteoarthritis   . PONV (postoperative nausea and vomiting)   . Trigeminal neuralgia 03/30/2015  . Vision abnormalities     Past Surgical History  Procedure Laterality Date  . Total hip arthroplasty    . Abdominal hysterectomy    . Tonsillectomy    . Joint replacement      right uni knee, right hip with 2 revisions  . Total knee arthroplasty  01/22/2013    Procedure: TOTAL KNEE ARTHROPLASTY;  Surgeon: Gearlean Alf, MD;  Location: [facility];  Service: Orthopedics;  Laterality: Right;  Revision of a Right Uni Knee to a Total Knee Arthroplasty    There were no vitals filed for this visit.  Visit Diagnosis:  Bilateral low back pain without sciatica  Left foot pain  Knee stiffness, right      Subjective Assessment - 05/10/15 1325    Subjective I'm really hurting in my hands today.   Currently in Pain? Yes   Pain Score 6    Pain Location Hand   Pain Orientation Right   Pain Descriptors / Indicators Aching   Pain Type Chronic pain                         [facility] Adult PT Treatment/Exercise - 05/10/15 0001    Ambulation/Gait   Gait Comments 700+ feet, no rest and minimal deviation   Lumbar Exercises: Stretches   Passive Hamstring Stretch 20 seconds;4 reps   Hip Flexor Stretch 3 reps;20  seconds   ITB Stretch 20 seconds;5 reps   Lumbar Exercises: Aerobic   Elliptical Nustep L 5 6 min   Lumbar Exercises: Machines for Strengthening   Cybex Knee Extension 10#   Cybex Knee Flexion 25#   Leg Press 30#   Lumbar Exercises: Supine   Large Ball Abdominal Isometric 15 reps;3 seconds   Knee/Hip Exercises: Stretches   Passive Hamstring Stretch 20 seconds;3 reps   Hip Flexor Stretch 3 reps;20 seconds   ITB Stretch 3 reps;20 seconds   Knee/Hip Exercises: Standing   Other Standing Knee Exercises 5# hip abduction and extension   Electrical Stimulation   Electrical Stimulation Location low back and hip   Electrical Stimulation Parameters IFC   Electrical Stimulation Goals Pain                  PT Short Term Goals - 03/18/15 1010    PT SHORT TERM GOAL #1   Title independent with initial HEP   Status Achieved           PT Long Term Goals - 04/02/15 1110    PT LONG TERM GOAL #1   Title go up stairs step over step without rest  Status On-going   PT LONG TERM GOAL #2   Title increase AROM of the right knee to 100 degrees flexion   Status On-going   PT LONG TERM GOAL #3   Title no falls over a 4 week period   Baseline fall last week did not clear log in woods   Status On-going   PT LONG TERM GOAL #4   Title decrease pain 50%   Status On-going   PT LONG TERM GOAL #5   Title decrease low back pain 50%   Status On-going               Plan - 05/10/15 1358    Clinical Impression Statement Much improved with gait and flexibility, just sore   PT Next Visit Plan continue to work on stength of LE's and core   Consulted and Agree with Plan of Care Patient        Problem List Patient Active Problem List   Diagnosis Date Noted  . Trigeminal neuralgia 03/30/2015  . 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 Polypharmacy 09/11/2014  . Arthritis or polyarthritis, rheumatoid 05/14/2014  . Generalized OA 05/14/2014  . Postop Hyponatremia 01/23/2013  .  Postoperative anemia due to acute blood loss 01/23/2013  . Pain due to unicompartmental arthroplasty of knee 01/22/2013    Sumner Boast., PT 05/10/2015, 1:59 PM  [place], [name], [address] Phone: [phone]   Fax:  [phone]

## 2015-05-12 ENCOUNTER — Ambulatory Visit: Payer: Medicare Other | Admitting: Physical Therapy

## 2015-05-12 ENCOUNTER — Encounter: Payer: Self-pay | Admitting: Physical Therapy

## 2015-05-12 DIAGNOSIS — M545 Low back pain, unspecified: Secondary | ICD-10-CM

## 2015-05-12 DIAGNOSIS — Z471 Aftercare following joint replacement surgery: Secondary | ICD-10-CM | POA: Diagnosis not present

## 2015-05-12 DIAGNOSIS — M25661 Stiffness of right knee, not elsewhere classified: Secondary | ICD-10-CM

## 2015-05-12 NOTE — Therapy (Signed)
[place], [name], [address] Phone: [phone]   Fax:  [phone]  Physical Therapy Treatment  Patient Details  Name: Kristen Hansen MRN: [id number] Date of Birth: [birth year] Referring Provider:  Gaynelle Arabian, MD  Encounter Date: 05/12/2015      PT End of Session - 05/12/15 1524    Visit Number 14   PT Start Time 1655   PT Stop Time 1540   PT Time Calculation (min) 46 min      Past Medical History  Diagnosis Date  . 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 Rheumatoid arthritis(714.0)   . Hypercholesteremia   . Hypertension   . GERD (gastroesophageal reflux disease)   . Headache(784.0)   . Osteoarthritis   . PONV (postoperative nausea and vomiting)   . Trigeminal neuralgia 03/30/2015  . Vision abnormalities     Past Surgical History  Procedure Laterality Date  . Total hip arthroplasty    . Abdominal hysterectomy    . Tonsillectomy    . Joint replacement      right uni knee, right hip with 2 revisions  . Total knee arthroplasty  01/22/2013    Procedure: TOTAL KNEE ARTHROPLASTY;  Surgeon: Gearlean Alf, MD;  Location: [facility];  Service: Orthopedics;  Laterality: Right;  Revision of a Right Uni Knee to a Total Knee Arthroplasty    There were no vitals filed for this visit.  Visit Diagnosis:  Bilateral low back pain without sciatica  Knee stiffness, right      Subjective Assessment - 05/12/15 1458    Subjective I woke achey all over, but got more rest and feel better right now.  Had to go up an ddown stairs repetively last night and had some extra pain in the knees and right hip   Currently in Pain? Yes   Pain Score 3    Pain Location Hip   Pain Orientation Right   Pain Descriptors / Indicators Aching                         [facility] Adult PT Treatment/Exercise - 05/12/15 0001    Lumbar Exercises: Stretches   Passive Hamstring Stretch 20 seconds;4 reps   Hip Flexor Stretch 3  reps;20 seconds   ITB Stretch 20 seconds;5 reps   Lumbar Exercises: Aerobic   Elliptical Nustep L 5 6 min   [place] Elliptical R=6 I =10 x 2 minutes   Lumbar Exercises: Machines for Strengthening   Cybex Knee Extension 10#   Cybex Knee Flexion 25#   Leg Press 30#   Lumbar Exercises: Supine   Clam 20 reps;2 seconds   Bridge 10 reps;2 seconds   Large Ball Abdominal Isometric 15 reps;3 seconds   Electrical Stimulation   Electrical Stimulation Location right knee/hip   Electrical Stimulation Parameters IFC   Electrical Stimulation Goals Pain                  PT Short Term Goals - 03/18/15 1010    PT SHORT TERM GOAL #1   Title independent with initial HEP   Status Achieved           PT Long Term Goals - 05/12/15 1525    PT LONG TERM GOAL #1   Title go up stairs step over step without rest   Status Achieved   PT LONG TERM GOAL #2   Title increase AROM of the right knee  to 100 degrees flexion   Status Partially Met               Plan - 05/12/15 1524    Clinical Impression Statement able to tolerate elliptical today.  Did have some increased right hip and knee pain.  Much more flexible.  Still fatigues easily and is unsteady   PT Next Visit Plan continue to work on [place] and core   Consulted and Agree with Plan of Care Patient        Problem List Patient Active Problem List   Diagnosis Date Noted  . Trigeminal neuralgia 03/30/2015  . 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 Polypharmacy 09/11/2014  . Arthritis or polyarthritis, rheumatoid 05/14/2014  . Generalized OA 05/14/2014  . Postop Hyponatremia 01/23/2013  . Postoperative anemia due to acute blood loss 01/23/2013  . Pain due to unicompartmental arthroplasty of knee 01/22/2013    Sumner Boast., PT 05/12/2015, 3:26 PM  [place] [address] [place], [name], [address] Phone: [phone]   Fax:   [phone]

## 2015-05-13 ENCOUNTER — Telehealth: Payer: Self-pay | Admitting: Neurology

## 2015-05-13 ENCOUNTER — Other Ambulatory Visit: Payer: Self-pay | Admitting: *Deleted

## 2015-05-13 MED ORDER — ETODOLAC 400 MG PO TABS: 400.0000 mg | ORAL_TABLET | Freq: Two times a day (BID) | ORAL | Status: DC

## 2015-05-13 NOTE — Telephone Encounter (Signed)
I have spoken with Kristen Hansen this am.  She requests r/f of Clonazepam.  I advised that rx. written 03-30-15 had an additional 5 r/f on it, so she should have monthly r/f thru the first of Oct. 2016.  She verbalized understanding of same and will check with her pharmacy.  She also requested r/f of Etodolac.  I have escribed this to [facility] on [address] per her request/fim

## 2015-05-13 NOTE — Telephone Encounter (Signed)
Patient called requesting a refill for clonazePAM (KLONOPIN) 0.5 MG tablet. Patient can be reached @ [phone]

## 2015-05-20 ENCOUNTER — Ambulatory Visit: Payer: Medicare Other | Admitting: Physical Therapy

## 2015-05-31 ENCOUNTER — Encounter: Payer: Self-pay | Admitting: Physical Therapy

## 2015-05-31 ENCOUNTER — Ambulatory Visit: Payer: Medicare Other | Attending: Orthopedic Surgery | Admitting: Physical Therapy

## 2015-05-31 DIAGNOSIS — M545 Low back pain, unspecified: Secondary | ICD-10-CM

## 2015-05-31 DIAGNOSIS — M25661 Stiffness of right knee, not elsewhere classified: Secondary | ICD-10-CM | POA: Insufficient documentation

## 2015-05-31 DIAGNOSIS — M79672 Pain in left foot: Secondary | ICD-10-CM | POA: Diagnosis present

## 2015-05-31 NOTE — Therapy (Signed)
[place], [name], [address] Phone: [phone]   Fax:  [phone]  Physical Therapy Treatment  Patient Details  Name: Kristen Hansen MRN: [id number] Date of Birth: [birth year] Referring Provider:  Gaynelle Arabian, MD  Encounter Date: 05/31/2015      PT End of Session - 05/31/15 1656    Visit Number 15   PT Start Time 8341   PT Stop Time 1710   PT Time Calculation (min) 55 min   Activity Tolerance Patient tolerated treatment well      Past Medical History  Diagnosis Date  . 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 Rheumatoid arthritis(714.0)   . Hypercholesteremia   . Hypertension   . GERD (gastroesophageal reflux disease)   . Headache(784.0)   . Osteoarthritis   . PONV (postoperative nausea and vomiting)   . Trigeminal neuralgia 03/30/2015  . Vision abnormalities     Past Surgical History  Procedure Laterality Date  . Total hip arthroplasty    . Abdominal hysterectomy    . Tonsillectomy    . Joint replacement      right uni knee, right hip with 2 revisions  . Total knee arthroplasty  01/22/2013    Procedure: TOTAL KNEE ARTHROPLASTY;  Surgeon: Gearlean Alf, MD;  Location: [facility];  Service: Orthopedics;  Laterality: Right;  Revision of a Right Uni Knee to a Total Knee Arthroplasty    There were no vitals filed for this visit.  Visit Diagnosis:  Bilateral low back pain without sciatica  Left foot pain  Knee stiffness, right      Subjective Assessment - 05/31/15 1617    Subjective I was on a trip for four days driving with kids, I have carried a baby some and I am a little more sore.   Currently in Pain? Yes   Pain Score 5    Pain Location Hip  also c/o pain in the left foot   Pain Orientation Right   Aggravating Factors  sitting long periods in car   Pain Relieving Factors rest and heat and stretching                         [facility] Adult PT Treatment/Exercise - 05/31/15 0001     High Level Balance   High Level Balance Comments on [employer] ball toss, head turns and eyes closed   Lumbar Exercises: Stretches   Passive Hamstring Stretch 20 seconds;4 reps   Hip Flexor Stretch 3 reps;20 seconds   ITB Stretch 20 seconds;5 reps   Piriformis Stretch 3 reps;20 seconds   Lumbar Exercises: Aerobic   Elliptical Nustep L 5 6 min   Lumbar Exercises: Machines for Strengthening   Cybex Knee Extension 10#   Lumbar Exercises: Supine   Clam 20 reps;2 seconds   Bridge 10 reps;2 seconds   Knee/Hip Exercises: Stretches   Passive Hamstring Stretch 20 seconds;3 reps   ITB Stretch 3 reps;20 seconds   Gastroc Stretch 3 reps;20 seconds   Electrical Stimulation   Electrical Stimulation Location right ankle/foot   Electrical Stimulation Parameters IFC   Electrical Stimulation Goals Pain                  PT Short Term Goals - 03/18/15 1010    PT SHORT TERM GOAL #1   Title independent with initial HEP   Status Achieved           PT Long  Term Goals - 05/31/15 1701    PT LONG TERM GOAL #1   Title go up stairs step over step without rest   Status Achieved   PT LONG TERM GOAL #2   Title increase AROM of the right knee to 100 degrees flexion   Status Achieved   PT LONG TERM GOAL #3   Title no falls over a 4 week period   Status Achieved   PT LONG TERM GOAL #4   Title decrease pain 50%   Status Not Met               Plan - 05/31/15 1657    Clinical Impression Statement trip really was tough on me, I am so stiff and sore after this workout.  Walking better overall, just very tight in the LE's   PT Next Visit Plan talk with patient about advanced HEP and stretching at hom   Consulted and Agree with Plan of Care Patient        Problem List Patient Active Problem List   Diagnosis Date Noted  . Trigeminal neuralgia 03/30/2015  . 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 Polypharmacy 09/11/2014  . Arthritis or polyarthritis, rheumatoid 05/14/2014  .  Generalized OA 05/14/2014  . Postop Hyponatremia 01/23/2013  . Postoperative anemia due to acute blood loss 01/23/2013  . Pain due to unicompartmental arthroplasty of knee 01/22/2013    Sumner Boast., PT 05/31/2015, 5:03 PM  [place], [name], [address] Phone: [phone]   Fax:  [phone]

## 2015-06-03 ENCOUNTER — Ambulatory Visit: Payer: Medicare Other | Admitting: Physical Therapy

## 2015-06-07 ENCOUNTER — Ambulatory Visit: Payer: Medicare Other | Admitting: Physical Therapy

## 2015-06-07 ENCOUNTER — Encounter: Payer: Self-pay | Admitting: Physical Therapy

## 2015-06-07 DIAGNOSIS — M25661 Stiffness of right knee, not elsewhere classified: Secondary | ICD-10-CM

## 2015-06-07 DIAGNOSIS — M79672 Pain in left foot: Secondary | ICD-10-CM

## 2015-06-07 DIAGNOSIS — M545 Low back pain, unspecified: Secondary | ICD-10-CM

## 2015-06-07 NOTE — Therapy (Signed)
[place], [name], [address] Phone: [phone]   Fax:  [phone]  Physical Therapy Treatment  Patient Details  Name: Kristen Hansen MRN: [id number] Date of Birth: 1946-06-10 Referring Provider:  Gaynelle Arabian, MD  Encounter Date: 06/07/2015      PT End of Session - 06/07/15 1430    Visit Number 16   Date for PT Re-Evaluation 07/07/15   PT Start Time 7741   PT Stop Time 1500   PT Time Calculation (min) 63 min   Activity Tolerance Patient limited by pain      Past Medical History  Diagnosis Date  . 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 Rheumatoid arthritis(714.0)   . Hypercholesteremia   . Hypertension   . GERD (gastroesophageal reflux disease)   . Headache(784.0)   . Osteoarthritis   . PONV (postoperative nausea and vomiting)   . Trigeminal neuralgia 03/30/2015  . Vision abnormalities     Past Surgical History  Procedure Laterality Date  . Total hip arthroplasty    . Abdominal hysterectomy    . Tonsillectomy    . Joint replacement      right uni knee, right hip with 2 revisions  . Total knee arthroplasty  01/22/2013    Procedure: TOTAL KNEE ARTHROPLASTY;  Surgeon: Gearlean Alf, MD;  Location: [facility];  Service: Orthopedics;  Laterality: Right;  Revision of a Right Uni Knee to a Total Knee Arthroplasty    There were no vitals filed for this visit.  Visit Diagnosis:  Bilateral low back pain without sciatica - Plan: PT plan of care cert/re-cert  Left foot pain - Plan: PT plan of care cert/re-cert  Knee stiffness, right - Plan: PT plan of care cert/re-cert      Subjective Assessment - 06/07/15 1358    Subjective I had a fall last Wednesday, tripped over a threshold.  Fell and hurt hand, arm and knees.   Currently in Pain? Yes   Pain Score 5    Pain Location Knee   Pain Orientation Right                         [facility] Adult PT Treatment/Exercise - 06/07/15 0001    High  Level Balance   High Level Balance Activities Side stepping;Backward walking   Lumbar Exercises: Stretches   Passive Hamstring Stretch 20 seconds;4 reps   Piriformis Stretch 3 reps;20 seconds   Lumbar Exercises: Aerobic   Elliptical Nustep L 5 6 min   Electrical Stimulation   Electrical Stimulation Location right and left knee   Electrical Stimulation Parameters pre mod   Electrical Stimulation Goals Pain   Ultrasound   Ultrasound Location left knee   Ultrasound Parameters 100MHz   Ultrasound Goals Pain                  PT Short Term Goals - 03/18/15 1010    PT SHORT TERM GOAL #1   Title independent with initial HEP   Status Achieved           PT Long Term Goals - 06/07/15 1433    PT LONG TERM GOAL #1   Title go up stairs step over step without rest   Status Achieved   PT LONG TERM GOAL #2   Title increase AROM of the right knee to 100 degrees flexion   Status Achieved   PT LONG TERM GOAL #3   Title  no falls over a 4 week period   Status Partially Met   PT LONG TERM GOAL #4   Title decrease pain 50%   Status Partially Met               Plan - 06/07/15 1431    Clinical Impression Statement Overall, Patient had been doing very well with better function and ADL's, less issues with fatigue, unsteadiness and pain.  She went on an extended trip with small children and then had a fall, she has a slight set back but overall I feel that she is much better than two months ago.   PT Next Visit Plan look to get her doing her own advanced HEP if pain from the fall subsides   Consulted and Agree with Plan of Care Patient        Problem List Patient Active Problem List   Diagnosis Date Noted  . Trigeminal neuralgia 03/30/2015  . 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 Polypharmacy 09/11/2014  . Arthritis or polyarthritis, rheumatoid 05/14/2014  . Generalized OA 05/14/2014  . Postop Hyponatremia 01/23/2013  . Postoperative anemia due to acute blood  loss 01/23/2013  . Pain due to unicompartmental arthroplasty of knee 01/22/2013    Sumner Boast., PT 06/07/2015, 2:38 PM  [place], [name], [address] Phone: [phone]   Fax:  [phone]

## 2015-06-15 ENCOUNTER — Encounter: Payer: Self-pay | Admitting: Physical Therapy

## 2015-06-15 ENCOUNTER — Ambulatory Visit: Payer: Medicare Other | Admitting: Physical Therapy

## 2015-06-15 DIAGNOSIS — M545 Low back pain, unspecified: Secondary | ICD-10-CM

## 2015-06-15 DIAGNOSIS — M79672 Pain in left foot: Secondary | ICD-10-CM

## 2015-06-15 DIAGNOSIS — M25661 Stiffness of right knee, not elsewhere classified: Secondary | ICD-10-CM

## 2015-06-15 NOTE — Therapy (Signed)
[place], [name], [address] Phone: [phone]   Fax:  [phone]  Physical Therapy Treatment  Patient Details  Name: Kristen Hansen MRN: [id number] Date of Birth: 07-30-1946 Referring Provider:  Gaynelle Arabian, MD  Encounter Date: 06/15/2015      PT End of Session - 06/15/15 1447    Visit Number 17   PT Start Time 5852   PT Stop Time 1500   PT Time Calculation (min) 55 min   Activity Tolerance Patient limited by pain      Past Medical History  Diagnosis Date  . 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 Rheumatoid arthritis(714.0)   . Hypercholesteremia   . Hypertension   . GERD (gastroesophageal reflux disease)   . Headache(784.0)   . Osteoarthritis   . PONV (postoperative nausea and vomiting)   . Trigeminal neuralgia 03/30/2015  . Vision abnormalities     Past Surgical History  Procedure Laterality Date  . Total hip arthroplasty    . Abdominal hysterectomy    . Tonsillectomy    . Joint replacement      right uni knee, right hip with 2 revisions  . Total knee arthroplasty  01/22/2013    Procedure: TOTAL KNEE ARTHROPLASTY;  Surgeon: Gearlean Alf, MD;  Location: [facility];  Service: Orthopedics;  Laterality: Right;  Revision of a Right Uni Knee to a Total Knee Arthroplasty    There were no vitals filed for this visit.  Visit Diagnosis:  Bilateral low back pain without sciatica  Left foot pain  Knee stiffness, right      Subjective Assessment - 06/15/15 1409    Subjective I am still sore from the fall.  My left knee and shin area still hurt and are tender   Currently in Pain? Yes   Pain Score 3    Pain Location Knee   Pain Orientation Left   Pain Descriptors / Indicators Aching   Aggravating Factors  touching the knee is tender   Pain Relieving Factors the treatment last week helped some                         [facility] Adult PT Treatment/Exercise - 06/15/15 0001    High  Level Balance   High Level Balance Activities Side stepping;Backward walking   Lumbar Exercises: Stretches   Passive Hamstring Stretch 20 seconds;4 reps   ITB Stretch 20 seconds;5 reps   Lumbar Exercises: Aerobic   Elliptical Nustep L 5 6 min   Lumbar Exercises: Seated   Other Seated Lumbar Exercises ankle sit fit exercises, green tband ankle motions,    Lumbar Exercises: Supine   Bridge 10 reps;2 seconds   Knee/Hip Exercises: Stretches   Passive Hamstring Stretch 20 seconds;3 reps   Knee/Hip Exercises: Machines for Strengthening   Cybex Knee Extension 5#   Acupuncturist Stimulation Location right ankle   Electrical Stimulation Parameters premod   Electrical Stimulation Goals Pain   Ultrasound   Ultrasound Location left knee   Ultrasound Parameters 100MHz   Ultrasound Goals Pain                  PT Short Term Goals - 03/18/15 1010    PT SHORT TERM GOAL #1   Title independent with initial HEP   Status Achieved           PT Long Term Goals - 06/07/15 1433  PT LONG TERM GOAL #1   Title go up stairs step over step without rest   Status Achieved   PT LONG TERM GOAL #2   Title increase AROM of the right knee to 100 degrees flexion   Status Achieved   PT LONG TERM GOAL #3   Title no falls over a 4 week period   Status Partially Met   PT LONG TERM GOAL #4   Title decrease pain 50%   Status Partially Met               Plan - 06/15/15 1449    Clinical Impression Statement Still very tender and sore to touch on the left lateral knee and shin area, she is limping more on the left.  Bruising is decreased   PT Next Visit Plan look to get her doing her own advanced HEP if pain from the fall subsides   Consulted and Agree with Plan of Care Patient        Problem List Patient Active Problem List   Diagnosis Date Noted  . Trigeminal neuralgia 03/30/2015  . 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 Polypharmacy 09/11/2014  .  Arthritis or polyarthritis, rheumatoid 05/14/2014  . Generalized OA 05/14/2014  . Postop Hyponatremia 01/23/2013  . Postoperative anemia due to acute blood loss 01/23/2013  . Pain due to unicompartmental arthroplasty of knee 01/22/2013    Sumner Boast., PT 06/15/2015, 2:52 PM  [place], [name], [address] Phone: [phone]   Fax:  [phone]

## 2015-06-17 ENCOUNTER — Ambulatory Visit: Payer: Medicare Other | Admitting: Physical Therapy

## 2015-06-17 ENCOUNTER — Encounter: Payer: Self-pay | Admitting: Physical Therapy

## 2015-06-17 DIAGNOSIS — M25661 Stiffness of right knee, not elsewhere classified: Secondary | ICD-10-CM

## 2015-06-17 DIAGNOSIS — M545 Low back pain, unspecified: Secondary | ICD-10-CM

## 2015-06-17 DIAGNOSIS — M79672 Pain in left foot: Secondary | ICD-10-CM

## 2015-06-17 NOTE — Therapy (Addendum)
[place], [name], [address] Phone: [phone]   Fax:  [phone]  Physical Therapy Treatment  Patient Details  Name: Kristen Hansen MRN: [id number] Date of Birth: 1946/01/22 Referring Provider:  Gaynelle Arabian, MD  Encounter Date: 06/17/2015    Past Medical History  Diagnosis Date  . 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 Rheumatoid arthritis(714.0)   . Hypercholesteremia   . Hypertension   . GERD (gastroesophageal reflux disease)   . Headache(784.0)   . Osteoarthritis   . PONV (postoperative nausea and vomiting)   . Trigeminal neuralgia 03/30/2015  . Vision abnormalities     Past Surgical History  Procedure Laterality Date  . Total hip arthroplasty    . Abdominal hysterectomy    . Tonsillectomy    . Joint replacement      right uni knee, right hip with 2 revisions  . Total knee arthroplasty  01/22/2013    Procedure: TOTAL KNEE ARTHROPLASTY;  Surgeon: Gearlean Alf, MD;  Location: [facility];  Service: Orthopedics;  Laterality: Right;  Revision of a Right Uni Knee to a Total Knee Arthroplasty    There were no vitals filed for this visit.  Visit Diagnosis:  Bilateral low back pain without sciatica  Left foot pain  Knee stiffness, right                                 PT Short Term Goals - 03/18/15 1010    PT SHORT TERM GOAL #1   Title independent with initial HEP   Status Achieved           PT Long Term Goals - 06/07/15 1433    PT LONG TERM GOAL #1   Title go up stairs step over step without rest   Status Achieved   PT LONG TERM GOAL #2   Title increase AROM of the right knee to 100 degrees flexion   Status Achieved   PT LONG TERM GOAL #3   Title no falls over a 4 week period   Status Partially Met   PT LONG TERM GOAL #4   Title decrease pain 50%   Status Partially Met               Problem List Patient Active Problem List   Diagnosis Date  Noted  . Trigeminal neuralgia 03/30/2015  . 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 Polypharmacy 09/11/2014  . Arthritis or polyarthritis, rheumatoid 05/14/2014  . Generalized OA 05/14/2014  . Postop Hyponatremia 01/23/2013  . Postoperative anemia due to acute blood loss 01/23/2013  . Pain due to unicompartmental arthroplasty of knee 01/22/2013   PHYSICAL THERAPY DISCHARGE SUMMARY  Visits from Start of Care: 18  Current functional level related to goals / functional outcomes: Better gait and less pain, much more flexible   Remaining deficits: Some pain at times    Plan: Patient agrees to discharge.  Patient goals were partially met. Patient is being discharged due to meeting the stated rehab goals.  ?????      Sumner Boast., PT 08/04/2015, 7:56 AM  [facility] [place] [address] [place], [name], [address] Phone: [phone]   Fax:  [phone]

## 2015-06-22 ENCOUNTER — Ambulatory Visit: Payer: Medicare Other | Admitting: Physical Therapy

## 2015-08-10 ENCOUNTER — Other Ambulatory Visit: Payer: Self-pay

## 2015-08-10 DIAGNOSIS — Z1231 Encounter for screening mammogram for malignant neoplasm of breast: Secondary | ICD-10-CM

## 2015-08-17 ENCOUNTER — Ambulatory Visit
Admission: RE | Admit: 2015-08-17 | Discharge: 2015-08-17 | Disposition: A | Discharge status: Home / Self Care | Payer: Medicare Other | Source: Ambulatory Visit

## 2015-08-17 DIAGNOSIS — Z1231 Encounter for screening mammogram for malignant neoplasm of breast: Secondary | ICD-10-CM

## 2015-08-19 ENCOUNTER — Other Ambulatory Visit: Payer: Self-pay | Admitting: Obstetrics and Gynecology

## 2015-08-19 DIAGNOSIS — R928 Other abnormal and inconclusive findings on diagnostic imaging of breast: Secondary | ICD-10-CM

## 2015-08-24 ENCOUNTER — Ambulatory Visit
Admission: RE | Admit: 2015-08-24 | Discharge: 2015-08-24 | Disposition: A | Discharge status: Home / Self Care | Payer: Medicare Other | Source: Ambulatory Visit | Attending: Obstetrics and Gynecology | Admitting: Obstetrics and Gynecology

## 2015-08-24 DIAGNOSIS — R928 Other abnormal and inconclusive findings on diagnostic imaging of breast: Secondary | ICD-10-CM

## 2015-09-30 ENCOUNTER — Ambulatory Visit: Payer: Medicare Other | Admitting: Neurology

## 2015-10-04 ENCOUNTER — Other Ambulatory Visit: Payer: Self-pay | Admitting: Neurology

## 2015-10-04 ENCOUNTER — Encounter: Payer: Self-pay | Admitting: *Deleted

## 2015-10-04 NOTE — Telephone Encounter (Signed)
Has appt scheduled this month

## 2015-10-04 NOTE — Progress Notes (Unsigned)
Clonazepam rx. up front [facility]/fim 

## 2015-10-04 NOTE — Progress Notes (Unsigned)
Subjective:    Patient ID: Kristen Hansen is a 68 y.o. female.  HPI {Common ambulatory SmartLinks:19316}  Review of Systems  Objective:  Neurologic Exam  Physical Exam  Assessment:   ***  Plan:  error

## 2015-10-11 ENCOUNTER — Other Ambulatory Visit: Payer: Self-pay | Admitting: Neurology

## 2015-10-18 ENCOUNTER — Ambulatory Visit: Payer: Medicare Other | Admitting: Neurology

## 2015-10-20 ENCOUNTER — Encounter: Payer: Self-pay | Admitting: Neurology

## 2015-10-20 ENCOUNTER — Ambulatory Visit (INDEPENDENT_AMBULATORY_CARE_PROVIDER_SITE_OTHER): Payer: Medicare Other | Admitting: Neurology

## 2015-10-20 VITALS — BP 148/84 | HR 72 | Resp 14 | Ht 69.5 in | Wt 176.2 lb

## 2015-10-20 DIAGNOSIS — R5383 Other fatigue: Secondary | ICD-10-CM

## 2015-10-20 DIAGNOSIS — G500 Trigeminal neuralgia: Secondary | ICD-10-CM | POA: Diagnosis not present

## 2015-10-20 DIAGNOSIS — G4752 REM sleep behavior disorder: Secondary | ICD-10-CM

## 2015-10-20 DIAGNOSIS — M069 Rheumatoid arthritis, unspecified: Secondary | ICD-10-CM

## 2015-10-20 MED ORDER — CLONAZEPAM 0.5 MG PO TABS: 0.5000 mg | ORAL_TABLET | Freq: Every day | ORAL | Status: DC

## 2015-10-20 NOTE — Progress Notes (Signed)
[facility]  PATIENT: Kristen Hansen DOB: [birth year]  REFERRING DOCTOR OR PCP:  Reita Cliche SOURCE: patient  _________________________________   HISTORICAL  CHIEF COMPLAINT:  Chief Complaint  Patient presents with  . Trigeminal Neuralgia    Sts. left sided facial pain has been stable.  Sts. she continues Clonazepam for sleep--only occasionally has difficulty sleeping.  Sts. her husband tells her she is not hitting/running in her sleep anymore.  Sts. memory loss is about the same. Sts. she still has arthritic pain in her right foot--due to RA.  She plans to continue physical therapy/fim  . Sleep Disorder  . Memory Loss  . Rheumatoid Arthritis    HISTORY OF PRESENT ILLNESS:  Kristen Hansen is a 69 yo woman with Trigeminal neuralgia x many years.  She has done fairly well lately.    When present, pain is located in the left V2 distribution.   Stress and being nervous will cause an itching sensation in the left forehead but not trigger the worse V2 distribution pain.   When clusters of pain occur, a hot shower hitting her face.   Oxcarbazapine helped when it was started initially and she has continued on it.   In 2015, she had an exacerbation with many spells of electric like pain and Lamictal was added with very good benefit.    She has not needed any opiates recently.    She also has pain in the left scalp and left ear that is different than the TN pain.  The scalp/ear pain is more itching in quality.     Sleep/RBD:   She has disturbed sleep and was talking and hitting and thrashing.   She does much better on clonazepam but RBD still occurs with talking but nothing violent.    She has no running, kicking or getting out of bed as she did before clonazepam.    She denies tremor, rigidity or other PD like symptoms.   She has no definite cognitive issue.   Gait has worsened slightly in last 2 years with poor balance and 2-3 falls.    Fatigue:  She has had more fatigue the  past year.   She denies excessive sleepiness.    Arthritis:   She also has hand and hip arthritis and takes Tylenol with mild benefit.     She is having more trouble with her left ankle and may need surgery.   She also has more left knee pain    REVIEW OF SYSTEMS: Constitutional: No fevers, chills, sweats, or change in appetite Eyes: No visual changes, double vision, eye pain Ear, nose and throat: No hearing loss, ear pain, nasal congestion, sore throat Cardiovascular: No chest pain, palpitations Respiratory: No shortness of breath at rest or with exertion.   No wheezes GastrointestinaI: Has constipation.   No nausea, vomiting, diarrhea, abdominal pain, fecal incontinence Genitourinary: No dysuria, urinary retention or frequency.  No nocturia. Musculoskeletal:as above Integumentary: No rash, pruritus, skin lesions Neurological: as above Psychiatric: No depression at this time.  No anxiety Endocrine: No palpitations, diaphoresis, change in appetite, change in weigh or increased thirst Hematologic/Lymphatic: No anemia, purpura, petechiae. Allergic/Immunologic: No itchy/runny eyes, nasal congestion, recent allergic reactions, rashes  ALLERGIES: Allergies  Allergen Reactions  . Morphine And Related Hives  . Prevacid [Lansoprazole] Other (See Comments)    unknown  . Provigil [Modafinil]     dizzyness  . Adhesive [Tape] Rash    HOME MEDICATIONS:  Current outpatient prescriptions:  .  acetaminophen (  TYLENOL) 650 MG CR tablet, Take 1,300 mg by mouth every 8 (eight) hours as needed. Pain, Disp: , Rfl:  .  amLODipine-olmesartan (AZOR) 5-40 MG per tablet, Take 1 tablet by mouth every evening. , Disp: , Rfl:  .  celecoxib (CELEBREX) 200 MG capsule, , Disp: , Rfl:  .  cevimeline (EVOXAC) 30 MG capsule, TAKE ONE CAPSULE BY MOUTH TWICE DAILY, Disp: , Rfl:  .  clonazePAM (KLONOPIN) 0.5 MG tablet, TAKE 1 TAB BY MOUTH EVERY EVENING, Disp: 30 tablet, Rfl: 0 .  Coenzyme Q10 (CO Q 10) 100 MG  CAPS, Take by mouth., Disp: , Rfl:  .  etanercept (ENBREL SURECLICK) 50 MG/ML injection, INJECT 1ML (50MG ) UNDER THE SKIN ONCE PER WEEK, Disp: , Rfl:  .  hydroxypropyl methylcellulose (ISOPTO TEARS) 2.5 % ophthalmic solution, Place 1 drop into both eyes as needed. Dry eyes, Disp: , Rfl:  .  imipramine (TOFRANIL) 25 MG tablet, Take 1 tablet (25 mg total) by mouth at bedtime., Disp: 30 tablet, Rfl: 11 .  lamoTRIgine (LAMICTAL) 100 MG tablet, Take 1 tablet (100 mg total) by mouth 2 (two) times daily., Disp: 60 tablet, Rfl: 11 .  loratadine (CLARITIN) 10 MG tablet, Take 10 mg by mouth daily., Disp: , Rfl:  .  mometasone (ELOCON) 0.1 % cream, , Disp: , Rfl:  .  omeprazole (PRILOSEC) 20 MG capsule, Take 20 mg by mouth 2 (two) times daily. , Disp: , Rfl:  .  Oxcarbazepine (TRILEPTAL) 300 MG tablet, Take 1-2 tablets (300-600 mg total) by mouth 3 (three) times daily. 1 in the morning 1 at noon and 2 at bedtime, Disp: 90 tablet, Rfl: 11 .  pravastatin (PRAVACHOL) 40 MG tablet, Take 40 mg by mouth daily before breakfast. , Disp: , Rfl:   PAST MEDICAL HISTORY: Past Medical History  Diagnosis Date  . 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 Rheumatoid arthritis(714.0)   . Hypercholesteremia   . Hypertension   . GERD (gastroesophageal reflux disease)   . Headache(784.0)   . Osteoarthritis   . PONV (postoperative nausea and vomiting)   . Trigeminal neuralgia 03/30/2015  . Vision abnormalities     PAST SURGICAL HISTORY: Past Surgical History  Procedure Laterality Date  . Total hip arthroplasty    . Abdominal hysterectomy    . Tonsillectomy    . Joint replacement      right uni knee, right hip with 2 revisions  . Total knee arthroplasty  01/22/2013    Procedure: TOTAL KNEE ARTHROPLASTY;  Surgeon: Gearlean Alf, MD;  Location: [facility];  Service: Orthopedics;  Laterality: Right;  Revision of a Right Uni Knee to a Total Knee Arthroplasty    FAMILY HISTORY: Family History  Problem Relation Age of Onset  . Heart disease  Mother   . Parkinson's disease Father     SOCIAL HISTORY:  Social History   Social History  . Marital Status: Married    Spouse Name: N/A  . Number of Children: N/A  . Years of Education: N/A   Occupational History  . Not on file.   Social History Main Topics  . Smoking status: Never Smoker   . Smokeless tobacco: Never Used  . Alcohol Use: 0.0 oz/week    0 Standard drinks or equivalent per week     Comment: occasional  . Drug Use: No  . Sexual Activity: Not on file   Other Topics Concern  . Not on file   Social History Narrative     PHYSICAL EXAM  Filed Vitals:   10/20/15 1121  BP: 148/84  Pulse: 72  Resp: 14  Height: 5' 9.5" (1.765 m)  Weight: 176 lb 3.2 oz (79.924 kg)    Body mass index is 25.66 kg/(m^2).   General: The patient is well-developed and well-nourished and in no acute distress   Neurologic Exam  Mental status: The patient is alert and oriented x 3 at the time of the examination. The patient has apparent normal recent and remote memory, with an apparently normal attention span and concentration ability.   Speech is normal.  Cranial nerves: Extraocular movements are full.    Facial symmetry is present. There is good facial sensation to soft touch bilaterally.Facial strength is normal.  Trapezius and sternocleidomastoid strength is normal. No dysarthria is noted.    No obvious hearing deficits are noted.  Motor:  No tremor.  Muscle bulk is normal.   Tone is normal. Strength is  5 / 5 in all 4 extremities.   Sensory: Sensory testing is intact to pinprick, soft touch and vibration sensation in all 4 extremities.  Coordination: Cerebellar testing reveals good finger-nose-finger and heel-to-shin bilaterally.  Gait and station: Station is normal.   Gait is normal. 3 step 180 degree turn.  She has 2-3 steps retropulsion when gait disturbed.   Tandem gait is mildly wide. Romberg is negative.   Reflexes: Deep tendon reflexes are symmetric and normal  bilaterally.      DIAGNOSTIC DATA (LABS, IMAGING, TESTING) - I reviewed patient records, labs, notes, testing and imaging myself where available.  Lab Results  Component Value Date   WBC 7.1 01/25/2013   HGB 9.0* 01/25/2013   HCT 26.5* 01/25/2013   MCV 90.8 01/25/2013   PLT 194 01/25/2013      Component Value Date/Time   NA 131* 01/24/2013 0410   K 3.7 01/24/2013 0410   CL 98 01/24/2013 0410   CO2 25 01/24/2013 0410   GLUCOSE 123* 01/24/2013 0410   BUN 9 01/24/2013 0410   CREATININE 0.67 01/24/2013 0410   CALCIUM 8.2* 01/24/2013 0410   PROT 7.4 01/16/2013 1435   ALBUMIN 3.4* 01/16/2013 1435   AST 13 01/16/2013 1435   ALT 12 01/16/2013 1435   ALKPHOS 118* 01/16/2013 1435   BILITOT 0.1* 01/16/2013 1435   GFRNONAA 90* 01/24/2013 0410   GFRAA >90 01/24/2013 0410       ASSESSMENT AND PLAN  Trigeminal neuralgia  Rheumatoid arthritis, involving unspecified site, unspecified rheumatoid factor presence (HCC)  REM sleep behavior disorder  Other fatigue     1.   Continue clonazepam for RBD --- We had a long discussion about RBD and that about half the patients have this in combination with a degenerative disorder such as Lewy body disease or Parkinson disease. Although with Parkinson disease, the REM behavior disorder is usually concurrent, with Lewy body disease the REM behavior disorder may proceed other symptoms by 10 or more years.   Her minimal retropulsion is stable 2.   Continue trigeminal neuralgia meds -- oxcarbazepine and lamotrigine 3.    Continue to see rheum for RA. rtc 6 months or sooner if she has new worsening neurologic symptoms.    A. Felecia Shelling, MD, PhD 37/16/9678, 93:81 AM Certified in Neurology, Clinical Neurophysiology, Sleep Medicine, Pain Medicine and Neuroimaging  [facility] [address] [place],  [address] [phone]

## 2016-01-25 ENCOUNTER — Encounter: Payer: Self-pay | Admitting: *Deleted

## 2016-02-22 DIAGNOSIS — I10 Essential (primary) hypertension: Secondary | ICD-10-CM | POA: Insufficient documentation

## 2016-02-23 DIAGNOSIS — G609 Hereditary and idiopathic neuropathy, unspecified: Secondary | ICD-10-CM | POA: Insufficient documentation

## 2016-03-30 DIAGNOSIS — E559 Vitamin D deficiency, unspecified: Secondary | ICD-10-CM | POA: Insufficient documentation

## 2016-04-13 ENCOUNTER — Ambulatory Visit (INDEPENDENT_AMBULATORY_CARE_PROVIDER_SITE_OTHER): Payer: Medicare Other | Admitting: Neurology

## 2016-04-13 ENCOUNTER — Encounter: Payer: Self-pay | Admitting: Neurology

## 2016-04-13 VITALS — BP 160/92 | HR 64 | Resp 14 | Ht 69.5 in | Wt 181.0 lb

## 2016-04-13 DIAGNOSIS — M069 Rheumatoid arthritis, unspecified: Secondary | ICD-10-CM | POA: Diagnosis not present

## 2016-04-13 DIAGNOSIS — G500 Trigeminal neuralgia: Secondary | ICD-10-CM

## 2016-04-13 DIAGNOSIS — R5383 Other fatigue: Secondary | ICD-10-CM | POA: Diagnosis not present

## 2016-04-13 DIAGNOSIS — G4752 REM sleep behavior disorder: Secondary | ICD-10-CM

## 2016-04-13 MED ORDER — IMIPRAMINE HCL 25 MG PO TABS: 25.0000 mg | ORAL_TABLET | Freq: Every day | ORAL | Status: DC

## 2016-04-13 MED ORDER — OXCARBAZEPINE 300 MG PO TABS: 300.0000 mg | ORAL_TABLET | Freq: Three times a day (TID) | ORAL | Status: DC

## 2016-04-13 MED ORDER — LAMOTRIGINE 100 MG PO TABS: 100.0000 mg | ORAL_TABLET | Freq: Two times a day (BID) | ORAL | Status: DC

## 2016-04-13 MED ORDER — CLONAZEPAM 0.5 MG PO TABS: 0.5000 mg | ORAL_TABLET | Freq: Every day | ORAL | Status: DC

## 2016-04-13 NOTE — Progress Notes (Signed)
[facility]  PATIENT: Kristen Hansen DOB: November 26, 1946  REFERRING DOCTOR OR PCP:  Reita Cliche SOURCE: patient  _________________________________   HISTORICAL  CHIEF COMPLAINT:  Chief Complaint  Patient presents with  . Trigeminal Neuralgia    Sts. she has not had any facial pain since Christmas.  Sts. she has decreased Oxcarbazepine to 300mg  tid, and Lamictal to 50 mg occasionally--not every day.  She has new c/o itching to scalp area.  Sts. Lamictal helps this. Sts. she is sleeping prretty well---occasionally wakes (about once every 2-3 weeks), and husband will tell her she had a bad night.  Denies running, hitting in her sleep./fim  . Sleep Disorder    HISTORY OF PRESENT ILLNESS:  Kristen Hansen is a 70 yo woman with Trigeminal neuralgia x many years.  She has done fairly well lately.      Trigeminal Neuralgia pain:   Her pain is generally better When present, pain is located in the left V2 distribution.   Stress and being nervous will cause an itching sensation in the left forehead but not trigger the worse V2 distribution pain.   When clusters of pain occur, a hot shower hitting her face.   Oxcarbazapine helps and she has continued on it (takes 3 a day). She takes lamotrigine 50 mg if head itches up to twice a day.      She has not needed any opiates recently.    She also has pain in the left scalp and left ear that is different than the [place] pain.  The scalp/ear pain is more itching in quality.   Last exacerbation was 11/2015 and was milder than others in the past.    Sleep/RBD:   She has disturbed sleep and was talking and hitting and thrashing.   She does much better on clonazepam.   She still occasional moves her arms like she is drawing in her hands and talks.    This is usually around 4 am.   Nothing violent recently.    She has no running, kicking or getting out of bed as she did before clonazepam.    She denies tremor, rigidity or other PD like symptoms.   She  has no definite cognitive issue.   Gait has worsened slightly in last 2 years with poor balance and 2-3 falls.    Fatigue:  She has had more fatigue the past year.   She denies excessive sleepiness.    Rheumatoid Arthritis:   She has hand and hip arthritis and takes Celebrex and Enbrel.    Hands are hurting more.   She also has scoliosis.   Getting up/down form chair is harder.    REVIEW OF SYSTEMS: Constitutional: No fevers, chills, sweats, or change in appetite Eyes: No visual changes, double vision, eye pain Ear, nose and throat: No hearing loss, ear pain, nasal congestion, sore throat Cardiovascular: No chest pain, palpitations Respiratory: No shortness of breath at rest or with exertion.   No wheezes GastrointestinaI: Has constipation.   No nausea, vomiting, diarrhea, abdominal pain, fecal incontinence Genitourinary: No dysuria, urinary retention or frequency.  No nocturia. Musculoskeletal:as above Integumentary: No rash, pruritus, skin lesions Neurological: as above Psychiatric: No depression at this time.  No anxiety Endocrine: No palpitations, diaphoresis, change in appetite, change in weigh or increased thirst Hematologic/Lymphatic: No anemia, purpura, petechiae. Allergic/Immunologic: No itchy/runny eyes, nasal congestion, recent allergic reactions, rashes  ALLERGIES: Allergies  Allergen Reactions  . Morphine And Related Hives  . Prevacid [Lansoprazole]  Other (See Comments)    unknown  . Provigil [Modafinil]     dizzyness  . Adhesive [Tape] Rash    HOME MEDICATIONS:  Current outpatient prescriptions:  .  acetaminophen (TYLENOL) 650 MG CR tablet, Take 1,300 mg by mouth every 8 (eight) hours as needed. Pain, Disp: , Rfl:  .  amLODipine-olmesartan (AZOR) 5-40 MG per tablet, Take 1 tablet by mouth every evening. , Disp: , Rfl:  .  celecoxib (CELEBREX) 200 MG capsule, , Disp: , Rfl:  .  clonazePAM (KLONOPIN) 0.5 MG tablet, Take 1 tablet (0.5 mg total) by mouth at  bedtime., Disp: 30 tablet, Rfl: 5 .  Coenzyme Q10 (CO Q 10) 100 MG CAPS, Take by mouth., Disp: , Rfl:  .  etanercept (ENBREL SURECLICK) 50 MG/ML injection, INJECT 1ML (50MG ) UNDER THE SKIN ONCE PER WEEK, Disp: , Rfl:  .  hydroxypropyl methylcellulose (ISOPTO TEARS) 2.5 % ophthalmic solution, Place 1 drop into both eyes as needed. Dry eyes, Disp: , Rfl:  .  imipramine (TOFRANIL) 25 MG tablet, Take 1 tablet (25 mg total) by mouth at bedtime., Disp: 30 tablet, Rfl: 11 .  lamoTRIgine (LAMICTAL) 100 MG tablet, Take 1 tablet (100 mg total) by mouth 2 (two) times daily., Disp: 60 tablet, Rfl: 11 .  loratadine (CLARITIN) 10 MG tablet, Take 10 mg by mouth daily., Disp: , Rfl:  .  omeprazole (PRILOSEC) 20 MG capsule, Take 20 mg by mouth 2 (two) times daily. , Disp: , Rfl:  .  Oxcarbazepine (TRILEPTAL) 300 MG tablet, Take 1-2 tablets (300-600 mg total) by mouth 3 (three) times daily. 1 in the morning 1 at noon and 2 at bedtime, Disp: 90 tablet, Rfl: 11 .  pravastatin (PRAVACHOL) 40 MG tablet, Take 40 mg by mouth daily before breakfast. , Disp: , Rfl:  .  tiZANidine (ZANAFLEX) 4 MG tablet, , Disp: , Rfl:  .  cevimeline (EVOXAC) 30 MG capsule, Reported on 04/13/2016, Disp: , Rfl:   PAST MEDICAL HISTORY: Past Medical History  Diagnosis Date  . 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 Rheumatoid arthritis(714.0)   . Hypercholesteremia   . Hypertension   . GERD (gastroesophageal reflux disease)   . Headache(784.0)   . Osteoarthritis   . PONV (postoperative nausea and vomiting)   . Trigeminal neuralgia 03/30/2015  . Vision abnormalities     PAST SURGICAL HISTORY: Past Surgical History  Procedure Laterality Date  . Total hip arthroplasty    . Abdominal hysterectomy    . Tonsillectomy    . Joint replacement      right uni knee, right hip with 2 revisions  . Total knee arthroplasty  01/22/2013    Procedure: TOTAL KNEE ARTHROPLASTY;  Surgeon: Gearlean Alf, MD;  Location: [facility];  Service: Orthopedics;  Laterality: Right;   Revision of a Right Uni Knee to a Total Knee Arthroplasty    FAMILY HISTORY: Family History  Problem Relation Age of Onset  . Heart disease Mother   . Parkinson's disease Father     SOCIAL HISTORY:  Social History   Social History  . Marital Status: Married    Spouse Name: N/A  . Number of Children: N/A  . Years of Education: N/A   Occupational History  . Not on file.   Social History Main Topics  . Smoking status: Never Smoker   . Smokeless tobacco: Never Used  . Alcohol Use: 0.0 oz/week    0 Standard drinks or equivalent per week     Comment: occasional  .  Drug Use: No  . Sexual Activity: Not on file   Other Topics Concern  . Not on file   Social History Narrative     PHYSICAL EXAM  Filed Vitals:   04/13/16 0942  BP: 160/92  Pulse: 64  Resp: 14  Height: 5' 9.5" (1.765 m)  Weight: 181 lb (82.101 kg)    Body mass index is 26.35 kg/(m^2).   General: The patient is well-developed and well-nourished and in no acute distress   Neurologic Exam  Mental status: The patient is alert and oriented x 3 at the time of the examination. The patient has apparent normal recent and remote memory, with an apparently normal attention span and concentration ability.   Speech is normal.  Cranial nerves: Extraocular movements are full.    Facial symmetry is present. There is good facial sensation to soft touch bilaterally.Facial strength is normal.  Trapezius and sternocleidomastoid strength is normal. No dysarthria is noted.    No obvious hearing deficits are noted.  Motor:  No tremor.  Muscle bulk is normal.   Tone is normal. Strength is  5 / 5 in all 4 extremities.   Sensory: Sensory testing is intact to pinprick, soft touch and vibration sensation in all 4 extremities.  Coordination: Cerebellar testing reveals good finger-nose-finger and heel-to-shin bilaterally.  Gait and station: Station is normal.   Gait is normal. 3 step 180 degree turn.  She has 2-3 steps  retropulsion when gait disturbed.   Tandem gait is mildly wide. Romberg is negative.   Reflexes: Deep tendon reflexes are symmetric and normal bilaterally.      DIAGNOSTIC DATA (LABS, IMAGING, TESTING) - I reviewed patient records, labs, notes, testing and imaging myself where available.  Lab Results  Component Value Date   WBC 7.1 01/25/2013   HGB 9.0* 01/25/2013   HCT 26.5* 01/25/2013   MCV 90.8 01/25/2013   PLT 194 01/25/2013      Component Value Date/Time   NA 131* 01/24/2013 0410   K 3.7 01/24/2013 0410   CL 98 01/24/2013 0410   CO2 25 01/24/2013 0410   GLUCOSE 123* 01/24/2013 0410   BUN 9 01/24/2013 0410   CREATININE 0.67 01/24/2013 0410   CALCIUM 8.2* 01/24/2013 0410   PROT 7.4 01/16/2013 1435   ALBUMIN 3.4* 01/16/2013 1435   AST 13 01/16/2013 1435   ALT 12 01/16/2013 1435   ALKPHOS 118* 01/16/2013 1435   BILITOT 0.1* 01/16/2013 1435   GFRNONAA 90* 01/24/2013 0410   GFRAA >90 01/24/2013 0410       ASSESSMENT AND PLAN  Trigeminal neuralgia  Rheumatoid arthritis, involving unspecified site, unspecified rheumatoid factor presence ([place]) - Plan: Ambulatory referral to Rheumatology  Other fatigue  REM sleep behavior disorder      1.   Renew trigeminal neuralgia meds -- oxcarbazepine and lamotrigine. 2.   Continue clonazepam for RBD --- We had a long discussion about RBD and that about half the patients have this in combination with a degenerative disorder such as Lewy body disease or Parkinson disease. Although with Parkinson disease, the REM behavior disorder is usually concurrent, with Lewy body disease the REM behavior disorder may proceed other symptoms by 10 or more years.   Her minimal retropulsion is stable 3.    See rheum for RA.  She feels she is doing better on Enbrel. rtc 6 months or sooner if she has new worsening neurologic symptoms.    A. Felecia Shelling, MD, PhD 04/19/8340, 9:62 AM Certified in Neurology, Clinical  Neurophysiology, Sleep  Medicine, Pain Medicine and Neuroimaging  [facility] [address], [place], [place] [address] [phone]

## 2016-04-14 ENCOUNTER — Other Ambulatory Visit: Payer: Self-pay | Admitting: Neurology

## 2016-04-19 ENCOUNTER — Ambulatory Visit: Payer: Medicare Other | Admitting: Neurology

## 2016-09-06 ENCOUNTER — Other Ambulatory Visit: Payer: Self-pay | Admitting: Internal Medicine

## 2016-09-06 DIAGNOSIS — Z1231 Encounter for screening mammogram for malignant neoplasm of breast: Secondary | ICD-10-CM

## 2016-09-06 DIAGNOSIS — Z9882 Breast implant status: Secondary | ICD-10-CM

## 2016-09-20 ENCOUNTER — Ambulatory Visit
Admission: RE | Admit: 2016-09-20 | Discharge: 2016-09-20 | Disposition: A | Discharge status: Home / Self Care | Payer: Medicare Other | Source: Ambulatory Visit | Attending: Internal Medicine | Admitting: Internal Medicine

## 2016-09-20 DIAGNOSIS — Z9882 Breast implant status: Secondary | ICD-10-CM

## 2016-09-20 DIAGNOSIS — Z1231 Encounter for screening mammogram for malignant neoplasm of breast: Secondary | ICD-10-CM

## 2016-10-09 ENCOUNTER — Ambulatory Visit: Payer: Medicare Other | Admitting: Neurology

## 2016-10-11 ENCOUNTER — Encounter: Payer: Self-pay | Admitting: Neurology

## 2016-10-31 ENCOUNTER — Ambulatory Visit (INDEPENDENT_AMBULATORY_CARE_PROVIDER_SITE_OTHER): Payer: Medicare Other | Admitting: Neurology

## 2016-10-31 ENCOUNTER — Encounter: Payer: Self-pay | Admitting: Neurology

## 2016-10-31 VITALS — BP 176/98 | HR 72 | Resp 16 | Ht 69.5 in | Wt 188.0 lb

## 2016-10-31 DIAGNOSIS — G500 Trigeminal neuralgia: Secondary | ICD-10-CM

## 2016-10-31 DIAGNOSIS — G4752 REM sleep behavior disorder: Secondary | ICD-10-CM | POA: Diagnosis not present

## 2016-10-31 DIAGNOSIS — R5383 Other fatigue: Secondary | ICD-10-CM

## 2016-10-31 DIAGNOSIS — M069 Rheumatoid arthritis, unspecified: Secondary | ICD-10-CM | POA: Diagnosis not present

## 2016-10-31 DIAGNOSIS — G4710 Hypersomnia, unspecified: Secondary | ICD-10-CM

## 2016-10-31 MED ORDER — IMIPRAMINE HCL 25 MG PO TABS: 25.0000 mg | ORAL_TABLET | Freq: Every day | ORAL | 11 refills | Status: DC

## 2016-10-31 MED ORDER — OXCARBAZEPINE 300 MG PO TABS: 300.0000 mg | ORAL_TABLET | Freq: Three times a day (TID) | ORAL | 11 refills | Status: DC

## 2016-10-31 MED ORDER — CLONAZEPAM 0.5 MG PO TABS: 0.5000 mg | ORAL_TABLET | Freq: Every day | ORAL | 5 refills | Status: DC

## 2016-10-31 MED ORDER — LAMOTRIGINE 100 MG PO TABS: 100.0000 mg | ORAL_TABLET | Freq: Two times a day (BID) | ORAL | 11 refills | Status: DC
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
## 2016-11-06 ENCOUNTER — Other Ambulatory Visit: Payer: Self-pay | Admitting: Neurology

## 2017-02-06 ENCOUNTER — Other Ambulatory Visit: Payer: Self-pay | Admitting: Neurology

## 2017-02-20 ENCOUNTER — Ambulatory Visit: Payer: Medicare Other | Attending: Physical Medicine and Rehabilitation | Admitting: Physical Therapy

## 2017-02-20 ENCOUNTER — Encounter: Payer: Self-pay | Admitting: Physical Therapy

## 2017-02-20 DIAGNOSIS — M25552 Pain in left hip: Secondary | ICD-10-CM | POA: Insufficient documentation

## 2017-02-20 DIAGNOSIS — M546 Pain in thoracic spine: Secondary | ICD-10-CM | POA: Insufficient documentation

## 2017-02-20 DIAGNOSIS — R262 Difficulty in walking, not elsewhere classified: Secondary | ICD-10-CM | POA: Diagnosis present

## 2017-02-20 NOTE — Therapy (Signed)
[facility] [place], [name], [address] Phone: [phone]   Fax:  [phone]  Physical Therapy Evaluation  Patient Details  Name: Kristen Hansen MRN: [id number] Date of Birth: 02/10/1946 Referring Provider: Nelva Bush  Encounter Date: 02/20/2017      PT End of Session - 02/20/17 1441    Visit Number 1   Date for PT Re-Evaluation 04/20/17   PT Start Time 1400   PT Stop Time 1500   PT Time Calculation (min) 60 min   Activity Tolerance Patient tolerated treatment well   Behavior During Therapy [facility] for tasks assessed/performed      Past Medical History:  Diagnosis Date  . GERD (gastroesophageal reflux disease)   . Headache(784.0)   . Hypercholesteremia   . Hypertension   . Osteoarthritis   . PONV (postoperative nausea and vomiting)   . Rheumatoid arthritis(714.0)   . 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 Trigeminal neuralgia 03/30/2015  . Vision abnormalities     Past Surgical History:  Procedure Laterality Date  . ABDOMINAL HYSTERECTOMY    . JOINT REPLACEMENT     right uni knee, right hip with 2 revisions  . TONSILLECTOMY    . TOTAL HIP ARTHROPLASTY    . TOTAL KNEE ARTHROPLASTY  01/22/2013   Procedure: TOTAL KNEE ARTHROPLASTY;  Surgeon: Gearlean Alf, MD;  Location: [facility];  Service: Orthopedics;  Laterality: Right;  Revision of a Right Uni Knee to a Total Knee Arthroplasty    There were no vitals filed for this visit.       Subjective Assessment - 02/20/17 1406    Subjective Patient reports that in October she started having left side pain, she reports that the pain has continued since that time, she reports that sll the current testing has been negative, there is a kidney stone on that side but the MD's do not feel that is causing her pain.     Patient Stated Goals have less pain   Currently in Pain? Yes   Pain Score 5    Pain Location Thoracic   Pain Orientation Left;Lower   Pain Descriptors / Indicators  Aching   Pain Type Acute pain   Pain Onset More than a month ago   Pain Frequency Intermittent   Aggravating Factors  sitting, standing 10-15 minutes pain will go up to 9/10   Pain Relieving Factors lying down flat 10-15 minutes and the pain will be 0/10-   Effect of Pain on Daily Activities limits ability to do ADL's and life            [facility] PT Assessment - 02/20/17 0001      Assessment   Medical Diagnosis left thoracic and low back pain   Referring Provider Ramos   Onset Date/Surgical Date 01/20/17   Prior Therapy years ago for hip     Precautions   Precautions None     Balance Screen   Has the patient fallen in the past 6 months No   Has the patient had a decrease in activity level because of a fear of falling?  No   Is the patient reluctant to leave their home because of a fear of falling?  No     Home Environment   Additional Comments has stairs, does housework and likes to do yardwork     Prior Function   Level of Independence Independent   Vocation Retired   Leisure no exercise  Posture/Postural Control   Posture Comments scoliosis, decreased lordosis, fwd head, rounded shoulders     ROM / Strength   AROM / PROM / Strength AROM;Strength     AROM   Overall AROM Comments Lumbar ROM decreased 75% with some pain in the left low back     Strength   Overall Strength Comments 4-/5 for the LE's with slight increase in the left side pain     Flexibility   Soft Tissue Assessment /Muscle Length --  very tight for the LE's all mms     Palpation   Palpation comment very tight in the left lumbar parapsinals, she has a very tender area just inferior to the left iliac crest which is where she says "taht is it", I tried to palpate the iliopsoas as well, this was tender but not her pain     Ambulation/Gait   Gait Comments unsteady, right leg longer, antalgic on the left, left stance seems to be painful                   [facility] Adult PT Treatment/Exercise  - 02/20/17 0001      Modalities   Modalities Electrical Stimulation;Moist Heat;Ultrasound     Moist Heat Therapy   Number Minutes Moist Heat 15 Minutes   Moist Heat Location Hip     Electrical Stimulation   Electrical Stimulation Location left iliac crest area   Electrical Stimulation Action IFC   Electrical Stimulation Parameters supine   Electrical Stimulation Goals Pain     Ultrasound   Ultrasound Location left iliac crest   Ultrasound Parameters 100% 1.5w/cm2   Ultrasound Goals Pain                  PT Short Term Goals - 02/20/17 1447      PT SHORT TERM GOAL #1   Title independent with initial HEP   Time 2   Period Weeks   Status New           PT Long Term Goals - 02/20/17 1447      PT LONG TERM GOAL #1   Title go up stairs step over step without rest   Time 8   Period Weeks   Status New     PT LONG TERM GOAL #2   Title decrease pain 50%   Time 8   Period Weeks   Status New     PT LONG TERM GOAL #3   Title increase Lumbar ROM 25%   Time 8   Period Weeks   Status New     PT LONG TERM GOAL #4   Title tolerate standing 30 minutes   Time 8   Period Weeks   Status New               Plan - 02/20/17 1444    Clinical Impression Statement Patient reports onset of left flank pain about 3-6 months ago, she is unsure of a cause.  She reports that she has a kidney stone on that side but the MD's felt like it was not the kidney stone.  She is very tight in all of the mms of the LE's.  She has a tender area just inferior to the left iliac crest   Rehab Potential Good   PT Frequency 2x / week   PT Duration 8 weeks   PT Treatment/Interventions ADLs/Self Care Home Management;Cryotherapy;Electrical Stimulation;Iontophoresis 4mg /ml Dexamethasone;Moist Heat;Ultrasound;Gait training;Stair training;Therapeutic activities;Therapeutic exercise;Balance training;Neuromuscular re-education;Patient/family education;Manual techniques   PT  Next Visit Plan  see if the estim helped, could try flexibility for sure, possibly Ionto and or traction   Consulted and Agree with Plan of Care Patient      Patient will benefit from skilled therapeutic intervention in order to improve the following deficits and impairments:  Abnormal gait, Decreased activity tolerance, Decreased mobility, Decreased range of motion, Decreased strength, Difficulty walking, Increased muscle spasms, Increased fascial restricitons, Impaired flexibility, Pain, Improper body mechanics, Postural dysfunction  Visit Diagnosis: Acute left-sided thoracic back pain - Plan: PT plan of care cert/re-cert  Pain in left hip - Plan: PT plan of care cert/re-cert  Difficulty in walking, not elsewhere classified - Plan: PT plan of care cert/re-cert      G-Codes - [id number]    Functional Assessment Tool Used (Outpatient Only) foto 63% limitation   Functional Limitation Mobility: Walking and moving around   Mobility: Walking and Moving Around Current Status [id number]) At least 60 percent but less than 80 percent impaired, limited or restricted   Mobility: Walking and Moving Around Goal Status [id number]) At least 40 percent but less than 60 percent impaired, limited or restricted       Problem List Patient Active Problem List   Diagnosis Date Noted  . Excessive sleepiness 10/31/2016  . Avitaminosis D 03/30/2016  . Idiopathic peripheral neuropathy 02/23/2016  . BP (high blood pressure) 02/22/2016  . Other fatigue 10/20/2015  . Trigeminal neuralgia 03/30/2015  . 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 Polypharmacy 09/11/2014  . Other long term (current) drug therapy 09/11/2014  . Arthritis or polyarthritis, rheumatoid ([place]) 05/14/2014  . Generalized OA 05/14/2014  . Rheumatoid arthritis ([place]) 05/14/2014  . Postop Hyponatremia 01/23/2013  . Postoperative anemia due to acute blood loss 01/23/2013  . Pain due to unicompartmental arthroplasty of knee ([place]) 01/22/2013  . Pain due to  knee joint prosthesis ([place]) 01/22/2013  . Mucositis oral 08/20/2012    Sumner Boast., PT 02/20/2017, 2:53 PM  [facility], [name], [address] Phone: [phone]   Fax:  [phone]  Name: Kristen Hansen MRN: [id number] Date of Birth: [birth year]

## 2017-02-26 ENCOUNTER — Encounter: Payer: Self-pay | Admitting: Physical Therapy

## 2017-02-26 ENCOUNTER — Ambulatory Visit: Payer: Medicare Other | Attending: Physical Medicine and Rehabilitation | Admitting: Physical Therapy

## 2017-02-26 DIAGNOSIS — M546 Pain in thoracic spine: Secondary | ICD-10-CM | POA: Insufficient documentation

## 2017-02-26 DIAGNOSIS — M25552 Pain in left hip: Secondary | ICD-10-CM | POA: Diagnosis present

## 2017-02-26 DIAGNOSIS — M545 Low back pain, unspecified: Secondary | ICD-10-CM

## 2017-02-26 DIAGNOSIS — R262 Difficulty in walking, not elsewhere classified: Secondary | ICD-10-CM | POA: Insufficient documentation

## 2017-02-26 NOTE — Therapy (Signed)
[facility] [place], [name], [address] Phone: [phone]   Fax:  [phone]  Physical Therapy Treatment  Patient Details  Name: Kristen Hansen MRN: [id number] Date of Birth: 02-01-1946 Referring Provider: Nelva Bush  Encounter Date: 02/26/2017      PT End of Session - 02/26/17 1513    Visit Number 2   PT Start Time 1430   PT Stop Time [id number]   PT Time Calculation (min) 57 min   Activity Tolerance Patient tolerated treatment well   Behavior During Therapy [facility] for tasks assessed/performed      Past Medical History:  Diagnosis Date  . GERD (gastroesophageal reflux disease)   . Headache(784.0)   . Hypercholesteremia   . Hypertension   . Osteoarthritis   . PONV (postoperative nausea and vomiting)   . Rheumatoid arthritis(714.0)   . 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 Trigeminal neuralgia 03/30/2015  . Vision abnormalities     Past Surgical History:  Procedure Laterality Date  . ABDOMINAL HYSTERECTOMY    . JOINT REPLACEMENT     right uni knee, right hip with 2 revisions  . TONSILLECTOMY    . TOTAL HIP ARTHROPLASTY    . TOTAL KNEE ARTHROPLASTY  01/22/2013   Procedure: TOTAL KNEE ARTHROPLASTY;  Surgeon: Gearlean Alf, MD;  Location: [facility];  Service: Orthopedics;  Laterality: Right;  Revision of a Right Uni Knee to a Total Knee Arthroplasty    There were no vitals filed for this visit.      Subjective Assessment - 02/26/17 1429    Subjective Not much better, still having pain in her side   Currently in Pain? Yes   Pain Score 7    Pain Location Thoracic   Pain Orientation Left;Lower                         [facility] Adult PT Treatment/Exercise - 02/26/17 0001      Exercises   Exercises Lumbar     Lumbar Exercises: Stretches   Passive Hamstring Stretch 3 reps;10 seconds   Single Knee to Chest Stretch 10 seconds;1 rep   Piriformis Stretch 3 reps;10 seconds     Lumbar Exercises: Aerobic   Elliptical Nustep L5 x 36min      Lumbar Exercises: Machines for Strengthening   Cybex Knee Extension 10lb 2x10   Cybex Knee Flexion 20lb 2x10   Other Lumbar Machine Exercise Rows and lats 20lb 2x10      Lumbar Exercises: Seated   Sit to Stand 5 reps  x2   Other Seated Lumbar Exercises Overhead raiss with red ball 2x10   Other Seated Lumbar Exercises Trunk rotations with red ball 2x10     Lumbar Exercises: Supine   Bridge 10 reps  x2     Modalities   Modalities Electrical Stimulation;Moist Heat;Ultrasound     Moist Heat Therapy   Number Minutes Moist Heat 15 Minutes   Moist Heat Location Hip     Electrical Stimulation   Electrical Stimulation Location left iliac crest area   Electrical Stimulation Action IFC   Electrical Stimulation Parameters supine   Electrical Stimulation Goals Pain                  PT Short Term Goals - 02/20/17 1447      PT SHORT TERM GOAL #1   Title independent with initial HEP   Time 2   Period Weeks  Status New           PT Long Term Goals - 02/20/17 1447      PT LONG TERM GOAL #1   Title go up stairs step over step without rest   Time 8   Period Weeks   Status New     PT LONG TERM GOAL #2   Title decrease pain 50%   Time 8   Period Weeks   Status New     PT LONG TERM GOAL #3   Title increase Lumbar ROM 25%   Time 8   Period Weeks   Status New     PT LONG TERM GOAL #4   Title tolerate standing 30 minutes   Time 8   Period Weeks   Status New               Plan - 02/26/17 1514    Clinical Impression Statement Pt with a mild increase in L flank pain during warm up on NuStep. Pt reports no increase in pain with the other interventions just a pulling sensation. Pt R knee ROM is limited, when performing sit to stand pt with heavy use of LLE.   Rehab Potential Good   PT Frequency 2x / week   PT Duration 8 weeks   PT Treatment/Interventions ADLs/Self Care Home Management;Cryotherapy;Electrical  Stimulation;Iontophoresis 4mg /ml Dexamethasone;Moist Heat;Ultrasound;Gait training;Stair training;Therapeutic activities;Therapeutic exercise;Balance training;Neuromuscular re-education;Patient/family education;Manual techniques   PT Next Visit Plan see if the estim helped, could try flexibility for sure, possibly Ionto and or traction      Patient will benefit from skilled therapeutic intervention in order to improve the following deficits and impairments:  Abnormal gait, Decreased activity tolerance, Decreased mobility, Decreased range of motion, Decreased strength, Difficulty walking, Increased muscle spasms, Increased fascial restricitons, Impaired flexibility, Pain, Improper body mechanics, Postural dysfunction  Visit Diagnosis: Acute left-sided thoracic back pain  Pain in left hip  Difficulty in walking, not elsewhere classified  Acute bilateral low back pain without sciatica     Problem List Patient Active Problem List   Diagnosis Date Noted  . Excessive sleepiness 10/31/2016  . Avitaminosis D 03/30/2016  . Idiopathic peripheral neuropathy 02/23/2016  . BP (high blood pressure) 02/22/2016  . Other fatigue 10/20/2015  . Trigeminal neuralgia 03/30/2015  . 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 Polypharmacy 09/11/2014  . Other long term (current) drug therapy 09/11/2014  . Arthritis or polyarthritis, rheumatoid ([place]) 05/14/2014  . Generalized OA 05/14/2014  . Rheumatoid arthritis ([place]) 05/14/2014  . Postop Hyponatremia 01/23/2013  . Postoperative anemia due to acute blood loss 01/23/2013  . Pain due to unicompartmental arthroplasty of knee ([place]) 01/22/2013  . Pain due to knee joint prosthesis ([place]) 01/22/2013  . Mucositis oral 08/20/2012    Scot Jun , PTA 02/26/2017, 3:16 PM  [place], [name], [address] Phone: [phone]   Fax:  [phone]  Name: Kristen Hansen MRN:  [id number] Date of Birth: 02/18/1946

## 2017-03-01 ENCOUNTER — Ambulatory Visit: Payer: Medicare Other | Admitting: Physical Therapy

## 2017-03-01 ENCOUNTER — Encounter: Payer: Self-pay | Admitting: Physical Therapy

## 2017-03-01 DIAGNOSIS — R262 Difficulty in walking, not elsewhere classified: Secondary | ICD-10-CM

## 2017-03-01 DIAGNOSIS — M25552 Pain in left hip: Secondary | ICD-10-CM

## 2017-03-01 DIAGNOSIS — M546 Pain in thoracic spine: Secondary | ICD-10-CM | POA: Diagnosis not present

## 2017-03-01 NOTE — Therapy (Signed)
[place], [name], [address] Phone: [phone]   Fax:  [phone]  Physical Therapy Treatment  Patient Details  Name: Kristen Hansen MRN: [id number] Date of Birth: 14-Sep-1946 Referring Provider: Nelva Bush  Encounter Date: 03/01/2017      PT End of Session - 03/01/17 1605    Visit Number 3   Date for PT Re-Evaluation 04/20/17   PT Start Time 6962   PT Stop Time 1630   PT Time Calculation (min) 60 min      Past Medical History:  Diagnosis Date  . GERD (gastroesophageal reflux disease)   . Headache(784.0)   . Hypercholesteremia   . Hypertension   . Osteoarthritis   . PONV (postoperative nausea and vomiting)   . Rheumatoid arthritis(714.0)   . 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 Trigeminal neuralgia 03/30/2015  . Vision abnormalities     Past Surgical History:  Procedure Laterality Date  . ABDOMINAL HYSTERECTOMY    . JOINT REPLACEMENT     right uni knee, right hip with 2 revisions  . TONSILLECTOMY    . TOTAL HIP ARTHROPLASTY    . TOTAL KNEE ARTHROPLASTY  01/22/2013   Procedure: TOTAL KNEE ARTHROPLASTY;  Surgeon: Gearlean Alf, MD;  Location: [facility];  Service: Orthopedics;  Laterality: Right;  Revision of a Right Uni Knee to a Total Knee Arthroplasty    There were no vitals filed for this visit.      Subjective Assessment - 03/01/17 1546    Subjective really hurting today, always worse as day goes on   Currently in Pain? Yes   Pain Score 9    Pain Location Thoracic   Pain Orientation Left                         [facility] Adult PT Treatment/Exercise - 03/01/17 0001      Lumbar Exercises: Aerobic   Elliptical Nustep L5 x 31min      Lumbar Exercises: Standing   Other Standing Lumbar Exercises 4# lat flex and obl twist  wall angels   Other Standing Lumbar Exercises 4# shruggs,backward rolls and scap squeeze     Electrical Stimulation   Electrical Stimulation Location left iliac  crest area   Electrical Stimulation Action IFC   Electrical Stimulation Parameters supine   Electrical Stimulation Goals Pain     Ultrasound   Ultrasound Location left IC   Ultrasound Parameters 100% 1.5 w/cm2   Ultrasound Goals Pain     Manual Therapy   Manual Therapy Soft tissue mobilization;Passive ROM;Taping   Manual therapy comments kinesio tape ot create space over IC   Soft tissue mobilization hip flex and hip   Passive ROM hip                  PT Short Term Goals - 02/20/17 1447      PT SHORT TERM GOAL #1   Title independent with initial HEP   Time 2   Period Weeks   Status New           PT Long Term Goals - 02/20/17 1447      PT LONG TERM GOAL #1   Title go up stairs step over step without rest   Time 8   Period Weeks   Status New     PT LONG TERM GOAL #2   Title decrease pain 50%   Time 8   Period  Weeks   Status New     PT LONG TERM GOAL #3   Title increase Lumbar ROM 25%   Time 8   Period Weeks   Status New     PT LONG TERM GOAL #4   Title tolerate standing 30 minutes   Time 8   Period Weeks   Status New               Plan - 03/01/17 1605    Clinical Impression Statement pt with increased pain today in wt bearing, unable to cause increased pain with palpation, trial of kinesiotape for pain. left leg shorter than RT and pt has orthotics- issue appears to be muscular and possible d/t poor gait .   PT Next Visit Plan assess and progress      Patient will benefit from skilled therapeutic intervention in order to improve the following deficits and impairments:  Abnormal gait, Decreased activity tolerance, Decreased mobility, Decreased range of motion, Decreased strength, Difficulty walking, Increased muscle spasms, Increased fascial restricitons, Impaired flexibility, Pain, Improper body mechanics, Postural dysfunction  Visit Diagnosis: Acute left-sided thoracic back pain  Pain in left hip  Difficulty in walking, not  elsewhere classified     Problem List Patient Active Problem List   Diagnosis Date Noted  . Excessive sleepiness 10/31/2016  . Avitaminosis D 03/30/2016  . Idiopathic peripheral neuropathy 02/23/2016  . BP (high blood pressure) 02/22/2016  . Other fatigue 10/20/2015  . Trigeminal neuralgia 03/30/2015  . 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 Polypharmacy 09/11/2014  . Other long term (current) drug therapy 09/11/2014  . Arthritis or polyarthritis, rheumatoid ([place]) 05/14/2014  . Generalized OA 05/14/2014  . Rheumatoid arthritis ([place]) 05/14/2014  . Postop Hyponatremia 01/23/2013  . Postoperative anemia due to acute blood loss 01/23/2013  . Pain due to unicompartmental arthroplasty of knee ([place]) 01/22/2013  . Pain due to knee joint prosthesis ([place]) 01/22/2013  . Mucositis oral 08/20/2012    PAYSEUR,ANGIE  PTA 03/01/2017, 4:07 PM  [place] [address] [place], [name], [address] Phone: [phone]   Fax:  [phone]  Name: ELIANNY BUXBAUM MRN: [id number] Date of Birth: 1946-06-19

## 2017-03-05 ENCOUNTER — Ambulatory Visit: Payer: Medicare Other | Admitting: Physical Therapy

## 2017-03-09 ENCOUNTER — Ambulatory Visit: Payer: Medicare Other | Admitting: Physical Therapy

## 2017-03-09 DIAGNOSIS — M546 Pain in thoracic spine: Secondary | ICD-10-CM | POA: Diagnosis not present

## 2017-03-09 DIAGNOSIS — M545 Low back pain, unspecified: Secondary | ICD-10-CM

## 2017-03-09 DIAGNOSIS — R262 Difficulty in walking, not elsewhere classified: Secondary | ICD-10-CM

## 2017-03-09 DIAGNOSIS — M25552 Pain in left hip: Secondary | ICD-10-CM

## 2017-03-09 NOTE — Therapy (Signed)
[place], [name], [address] Phone: [phone]   Fax:  [phone]  Physical Therapy Treatment  Patient Details  Name: Kristen Hansen MRN: [id number] Date of Birth: 11-May-1946 Referring Provider: Nelva Bush  Encounter Date: 03/09/2017      PT End of Session - 03/09/17 1202    Visit Number 4   Date for PT Re-Evaluation 04/20/17   PT Start Time 1100   PT Stop Time 0147   PT Time Calculation (min) 887 min   Activity Tolerance Patient tolerated treatment well   Behavior During Therapy [facility] for tasks assessed/performed      Past Medical History:  Diagnosis Date   GERD (gastroesophageal reflux disease)    Headache(784.0)    Hypercholesteremia    Hypertension    Osteoarthritis    PONV (postoperative nausea and vomiting)    Rheumatoid arthritis(714.0)    Tic [place]    Trigeminal neuralgia 03/30/2015   Vision abnormalities     Past Surgical History:  Procedure Laterality Date   ABDOMINAL HYSTERECTOMY     JOINT REPLACEMENT     right uni knee, right hip with 2 revisions   TONSILLECTOMY     TOTAL HIP ARTHROPLASTY     TOTAL KNEE ARTHROPLASTY  01/22/2013   Procedure: TOTAL KNEE ARTHROPLASTY;  Surgeon: Gearlean Alf, MD;  Location: [facility];  Service: Orthopedics;  Laterality: Right;  Revision of a Right Uni Knee to a Total Knee Arthroplasty    There were no vitals filed for this visit.      Subjective Assessment - 03/09/17 1152    Subjective Hurting when standing/ walking.  No pain when lying down.  Has gained weight and lost activity tolerance over the past 6 months d/t decreased activity.  MRI scheduled for next Tuesday 03/13/17   Currently in Pain? Yes   Pain Score 7    Pain Location Thoracic   Pain Orientation Left;Lower                         [facility] Adult PT Treatment/Exercise - 03/09/17 0001      Lumbar Exercises: Stretches   Passive Hamstring  Stretch 3 reps;30 seconds   Lower Trunk Rotation 10 seconds;5 reps;Other (comment)  knees Right   ITB Stretch 30 seconds;3 reps   Piriformis Stretch 30 seconds;3 reps     Lumbar Exercises: Standing   Other Standing Lumbar Exercises standing quadratus stretch with ball on raised table   Other Standing Lumbar Exercises seated Left thoracic elongation wtih P ball     Manual Therapy   Manual Therapy Soft tissue mobilization;Myofascial release   Manual therapy comments trigger point release, palmar spreading, ischemic compression to lumbar paraspinals   Soft tissue mobilization Left lumbar and quadratus                  PT Short Term Goals - 02/20/17 1447      PT SHORT TERM GOAL #1   Title independent with initial HEP   Time 2   Period Weeks   Status New           PT Long Term Goals - 02/20/17 1447      PT LONG TERM GOAL #1   Title go up stairs step over step without rest   Time 8   Period Weeks   Status New     PT LONG TERM GOAL #2   Title decrease  pain 50%   Time 8   Period Weeks   Status New     PT LONG TERM GOAL #3   Title increase Lumbar ROM 25%   Time 8   Period Weeks   Status New     PT LONG TERM GOAL #4   Title tolerate standing 30 minutes   Time 8   Period Weeks   Status New               Plan - 03/09/17 1202    Clinical Impression Statement Pt with shortened Left trunk and elongated Right trunk.  Significant Left hamstring, deep glute, piriformis, and quadratus tightness.  Responded well to Left trunk elongation techniques and soft tissue mobilization.        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   Polypharmacy 09/11/2014   Other long term (current) drug therapy 09/11/2014   Arthritis or polyarthritis, rheumatoid ([place]) 05/14/2014   Generalized OA 05/14/2014   Rheumatoid arthritis ([place]) 05/14/2014   Postop Hyponatremia 01/23/2013   Postoperative anemia due to acute blood loss 01/23/2013   Pain due to unicompartmental arthroplasty of knee ([place]) 01/22/2013   Pain due to knee joint prosthesis ([place]) 01/22/2013   Mucositis oral 08/20/2012    Olean Ree, PTA 03/09/2017, 12:06 PM  [place], [name], [address] Phone: [phone]   Fax:  [phone]  Name: Kristen Hansen MRN: [id number] Date of Birth: March 26, 1946

## 2017-03-12 ENCOUNTER — Encounter: Payer: Self-pay | Admitting: Physical Therapy

## 2017-03-12 ENCOUNTER — Ambulatory Visit: Payer: Medicare Other | Admitting: Physical Therapy

## 2017-03-12 DIAGNOSIS — R262 Difficulty in walking, not elsewhere classified: Secondary | ICD-10-CM

## 2017-03-12 DIAGNOSIS — M25552 Pain in left hip: Secondary | ICD-10-CM

## 2017-03-12 DIAGNOSIS — M546 Pain in thoracic spine: Secondary | ICD-10-CM | POA: Diagnosis not present

## 2017-03-12 NOTE — Therapy (Signed)
[place], [name], [address] Phone: [phone]   Fax:  [phone]  Physical Therapy Treatment  Patient Details  Name: Kristen Hansen MRN: [id number] Date of Birth: [birth year] Referring Provider: Nelva Bush  Encounter Date: 03/12/2017      PT End of Session - 03/12/17 0920    Visit Number 5   Date for PT Re-Evaluation 04/20/17   PT Start Time 0757   PT Stop Time 0840   PT Time Calculation (min) 43 min   Activity Tolerance Patient tolerated treatment well   Behavior During Therapy [facility] for tasks assessed/performed      Past Medical History:  Diagnosis Date  . GERD (gastroesophageal reflux disease)   . Headache(784.0)   . Hypercholesteremia   . Hypertension   . Osteoarthritis   . PONV (postoperative nausea and vomiting)   . Rheumatoid arthritis(714.0)   . 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 Trigeminal neuralgia 03/30/2015  . Vision abnormalities     Past Surgical History:  Procedure Laterality Date  . ABDOMINAL HYSTERECTOMY    . JOINT REPLACEMENT     right uni knee, right hip with 2 revisions  . TONSILLECTOMY    . TOTAL HIP ARTHROPLASTY    . TOTAL KNEE ARTHROPLASTY  01/22/2013   Procedure: TOTAL KNEE ARTHROPLASTY;  Surgeon: Gearlean Alf, MD;  Location: [facility];  Service: Orthopedics;  Laterality: Right;  Revision of a Right Uni Knee to a Total Knee Arthroplasty    There were no vitals filed for this visit.      Subjective Assessment - 03/12/17 0918    Subjective Patient reports that she felt like she was a little better after the treatment on Friday, she reports that she tried some yardwork over the weekend and had some increase of pain.   Currently in Pain? Yes   Pain Score 6    Pain Location Thoracic   Pain Orientation Left;Lower   Aggravating Factors  yardwork                         [facility] Adult PT Treatment/Exercise - 03/12/17 0001      Lumbar Exercises: Stretches   Passive Hamstring Stretch 3 reps;30 seconds   Lower Trunk Rotation 10 seconds;5 reps;Other (comment)   ITB Stretch 30 seconds;3 reps   Piriformis Stretch 30 seconds;3 reps     Lumbar Exercises: Standing   Other Standing Lumbar Exercises standing quadratus stretch with ball on raised table, wand overhead side bending stretch and rotation stretch   Other Standing Lumbar Exercises seated Left thoracic elongation wtih P ball     Moist Heat Therapy   Number Minutes Moist Heat 15 Minutes   Moist Heat Location Hip     Electrical Stimulation   Electrical Stimulation Location left iliac crest area   Electrical Stimulation Action IFC   Electrical Stimulation Parameters right side lying   Electrical Stimulation Goals Pain     Manual Therapy   Manual Therapy Soft tissue mobilization;Myofascial release   Manual therapy comments trigger point release, palmar spreading, ischemic compression to lumbar paraspinals   Soft tissue mobilization Left lumbar and quadratus                  PT Short Term Goals - 02/20/17 1447      PT SHORT TERM GOAL #1   Title independent with initial HEP   Time 2   Period Weeks  Status New           PT Long Term Goals - 02/20/17 1447      PT LONG TERM GOAL #1   Title go up stairs step over step without rest   Time 8   Period Weeks   Status New     PT LONG TERM GOAL #2   Title decrease pain 50%   Time 8   Period Weeks   Status New     PT LONG TERM GOAL #3   Title increase Lumbar ROM 25%   Time 8   Period Weeks   Status New     PT LONG TERM GOAL #4   Title tolerate standing 30 minutes   Time 8   Period Weeks   Status New               Plan - 03/12/17 1962    Clinical Impression Statement Patient with tightness and tenderness just inferior and superior to the left iliac crest.  Reports that the elongation stretches seem to help some.  Working in the yard increased the pain, she may need some core stabilization exercises    PT Next Visit Plan assess and progress   Consulted and Agree with Plan of Care Patient      Patient will benefit from skilled therapeutic intervention in order to improve the following deficits and impairments:  Abnormal gait, Decreased activity tolerance, Decreased mobility, Decreased range of motion, Decreased strength, Difficulty walking, Increased muscle spasms, Increased fascial restricitons, Impaired flexibility, Pain, Improper body mechanics, Postural dysfunction  Visit Diagnosis: Acute left-sided thoracic back pain  Pain in left hip  Difficulty in walking, not elsewhere classified     Problem List Patient Active Problem List   Diagnosis Date Noted  . Excessive sleepiness 10/31/2016  . Avitaminosis D 03/30/2016  . Idiopathic peripheral neuropathy 02/23/2016  . BP (high blood pressure) 02/22/2016  . Other fatigue 10/20/2015  . Trigeminal neuralgia 03/30/2015  . 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 Polypharmacy 09/11/2014  . Other long term (current) drug therapy 09/11/2014  . Arthritis or polyarthritis, rheumatoid ([place]) 05/14/2014  . Generalized OA 05/14/2014  . Rheumatoid arthritis ([place]) 05/14/2014  . Postop Hyponatremia 01/23/2013  . Postoperative anemia due to acute blood loss 01/23/2013  . Pain due to unicompartmental arthroplasty of knee ([place]) 01/22/2013  . Pain due to knee joint prosthesis ([place]) 01/22/2013  . Mucositis oral 08/20/2012    Sumner Boast., PT 03/12/2017, 9:22 AM  [facility] [place] [address] [place], [name], [address] Phone: [phone]   Fax:  [phone]  Name: DOMINQUE LEVANDOWSKI MRN: [id number] Date of Birth: 05/17/1946

## 2017-03-14 ENCOUNTER — Ambulatory Visit: Payer: Medicare Other | Admitting: Physical Therapy

## 2017-03-14 ENCOUNTER — Encounter: Payer: Medicare Other | Admitting: Physical Therapy

## 2017-03-16 ENCOUNTER — Ambulatory Visit: Payer: Medicare Other | Admitting: Physical Therapy

## 2017-03-19 ENCOUNTER — Ambulatory Visit: Payer: Medicare Other | Admitting: Physical Therapy

## 2017-03-22 ENCOUNTER — Ambulatory Visit: Payer: Medicare Other | Admitting: Physical Therapy

## 2017-03-26 ENCOUNTER — Ambulatory Visit: Payer: Medicare Other | Attending: Physical Medicine and Rehabilitation | Admitting: Physical Therapy

## 2017-03-26 ENCOUNTER — Encounter: Payer: Self-pay | Admitting: Physical Therapy

## 2017-03-26 DIAGNOSIS — M79672 Pain in left foot: Secondary | ICD-10-CM | POA: Insufficient documentation

## 2017-03-26 DIAGNOSIS — R262 Difficulty in walking, not elsewhere classified: Secondary | ICD-10-CM | POA: Diagnosis present

## 2017-03-26 DIAGNOSIS — M25661 Stiffness of right knee, not elsewhere classified: Secondary | ICD-10-CM | POA: Diagnosis present

## 2017-03-26 DIAGNOSIS — M25552 Pain in left hip: Secondary | ICD-10-CM | POA: Insufficient documentation

## 2017-03-26 DIAGNOSIS — M545 Low back pain: Secondary | ICD-10-CM | POA: Insufficient documentation

## 2017-03-26 DIAGNOSIS — M546 Pain in thoracic spine: Secondary | ICD-10-CM | POA: Insufficient documentation

## 2017-03-26 NOTE — Therapy (Signed)
[facility] [address] [place], [place], [address] Phone: [phone]   Fax:  [phone]  Physical Therapy Treatment  Patient Details  Name: Kristen Hansen MRN: [id number] Date of Birth: 04-21-1946 Referring Provider: Ethelene Hal  Encounter Date: 03/26/2017      PT End of Session - 03/26/17 1012    Visit Number 6   Date for PT Re-Evaluation 04/20/17   PT Start Time 0930   PT Stop Time 1027   PT Time Calculation (min) 57 min   Activity Tolerance Patient tolerated treatment well   Behavior During Therapy [facility] for tasks assessed/performed      Past Medical History:  Diagnosis Date  . GERD (gastroesophageal reflux disease)   . Headache(784.0)   . Hypercholesteremia   . Hypertension   . Osteoarthritis   . PONV (postoperative nausea and vomiting)   . Rheumatoid arthritis(714.0)   . 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 Trigeminal neuralgia 03/30/2015  . Vision abnormalities     Past Surgical History:  Procedure Laterality Date  . ABDOMINAL HYSTERECTOMY    . JOINT REPLACEMENT     right uni knee, right hip with 2 revisions  . TONSILLECTOMY    . TOTAL HIP ARTHROPLASTY    . TOTAL KNEE ARTHROPLASTY  01/22/2013   Procedure: TOTAL KNEE ARTHROPLASTY;  Surgeon: Loanne Drilling, MD;  Location: [facility];  Service: Orthopedics;  Laterality: Right;  Revision of a Right Uni Knee to a Total Knee Arthroplasty    There were no vitals filed for this visit.      Subjective Assessment - 03/26/17 0935    Subjective "doing pretty good, I got my MRI results back, it shows stenosis and degeneration all the way down"   Currently in Pain? Yes   Pain Score 3    Pain Location Thoracic                         [facility] Adult PT Treatment/Exercise - 03/26/17 0001      Lumbar Exercises: Stretches   Passive Hamstring Stretch 3 reps;30 seconds   Piriformis Stretch 30 seconds;3 reps     Lumbar Exercises: Aerobic   Elliptical Nustep L5 x       Lumbar Exercises: Machines for Strengthening   Cybex Knee Extension 5lb 2x10   Cybex Knee Flexion 20lb 2x15   Other Lumbar Machine Exercise Rows and lats 20lb 2x10      Lumbar Exercises: Standing   Row 20 reps;[facility] doctor Limitations 25     Lumbar Exercises: Supine   Other Supine Lumbar Exercises small bridges, K2C, small rotations on physo ball 2x15      Moist Heat Therapy   Number Minutes Moist Heat 15 Minutes   Moist Heat Location Hip     Electrical Stimulation   Electrical Stimulation Location left iliac crest area   Electrical Stimulation Action IFC   Electrical Stimulation Parameters righ side lying   Electrical Stimulation Goals Pain                  PT Short Term Goals - 02/20/17 1447      PT SHORT TERM GOAL #1   Title independent with initial HEP   Time 2   Period Weeks   Status New           PT Long Term Goals - 03/26/17 1015      PT LONG TERM GOAL #1  Title go up stairs step over step without rest   Status On-going     PT LONG TERM GOAL #2   Title decrease pain 50%   Status Partially Met     PT LONG TERM GOAL #4   Title tolerate standing 30 minutes   Status On-going     PT LONG TERM GOAL #5   Title decrease low back pain 50%   Status On-going               Plan - 03/26/17 1013    Clinical Impression Statement Pt enters clinic reporting her MRI showed stenosis and degenerating so today's treatment focused on strengthening to help support the core and spine. Pt reports no increase in pain with today's exercises. Pt L HS is tighter in the R compared to L.   Rehab Potential Good   PT Frequency 2x / week   PT Duration 8 weeks   PT Treatment/Interventions ADLs/Self Care Home Management;Cryotherapy;Electrical Stimulation;Iontophoresis 73m/ml Dexamethasone;Moist Heat;Ultrasound;Gait training;Stair training;Therapeutic activities;Therapeutic exercise;Balance training;Neuromuscular re-education;Patient/family  education;Manual techniques   PT Next Visit Plan assess and progress      Patient will benefit from skilled therapeutic intervention in order to improve the following deficits and impairments:  Abnormal gait, Decreased activity tolerance, Decreased mobility, Decreased range of motion, Decreased strength, Difficulty walking, Increased muscle spasms, Increased fascial restricitons, Impaired flexibility, Pain, Improper body mechanics, Postural dysfunction  Visit Diagnosis: Acute left-sided thoracic back pain  Pain in left hip  Difficulty in walking, not elsewhere classified     Problem List Patient Active Problem List   Diagnosis Date Noted  . Excessive sleepiness 10/31/2016  . Avitaminosis D 03/30/2016  . Idiopathic peripheral neuropathy 02/23/2016  . BP (high blood pressure) 02/22/2016  . Other fatigue 10/20/2015  . Trigeminal neuralgia 03/30/2015  . 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 Polypharmacy 09/11/2014  . Other long term (current) drug therapy 09/11/2014  . Arthritis or polyarthritis, rheumatoid ([place] 05/14/2014  . Generalized OA 05/14/2014  . Rheumatoid arthritis ([place] 05/14/2014  . Postop Hyponatremia 01/23/2013  . Postoperative anemia due to acute blood loss 01/23/2013  . Pain due to unicompartmental arthroplasty of knee ([place] 01/22/2013  . Pain due to knee joint prosthesis ([place] 01/22/2013  . Mucositis oral 08/20/2012    RScot Jun PTA 03/26/2017, 10:15 AM  [place] [address]Phone: [phone]  Fax:  [phone] Name: Kristen RANEYMRN: [id number] of Birth: 11/16/1946/06/16

## 2017-03-30 ENCOUNTER — Encounter: Payer: Self-pay | Admitting: Physical Therapy

## 2017-03-30 ENCOUNTER — Ambulatory Visit: Payer: Medicare Other | Admitting: Physical Therapy

## 2017-03-30 DIAGNOSIS — M25552 Pain in left hip: Secondary | ICD-10-CM

## 2017-03-30 DIAGNOSIS — M545 Low back pain, unspecified: Secondary | ICD-10-CM

## 2017-03-30 DIAGNOSIS — M546 Pain in thoracic spine: Secondary | ICD-10-CM | POA: Diagnosis not present

## 2017-03-30 DIAGNOSIS — R262 Difficulty in walking, not elsewhere classified: Secondary | ICD-10-CM

## 2017-03-30 NOTE — Therapy (Signed)
[place], [name], [address] Phone: [phone]   Fax:  [phone]  Physical Therapy Treatment  Patient Details  Name: Kristen Hansen MRN: [id number] Date of Birth: 22-Dec-1946 Referring Provider: Nelva Bush  Encounter Date: 03/30/2017      PT End of Session - 03/30/17 1012    Visit Number 7   Date for PT Re-Evaluation 04/20/17   PT Start Time 0930   PT Stop Time 1030   PT Time Calculation (min) 60 min      Past Medical History:  Diagnosis Date  . GERD (gastroesophageal reflux disease)   . Headache(784.0)   . Hypercholesteremia   . Hypertension   . Osteoarthritis   . PONV (postoperative nausea and vomiting)   . Rheumatoid arthritis(714.0)   . 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 Trigeminal neuralgia 03/30/2015  . Vision abnormalities     Past Surgical History:  Procedure Laterality Date  . ABDOMINAL HYSTERECTOMY    . JOINT REPLACEMENT     right uni knee, right hip with 2 revisions  . TONSILLECTOMY    . TOTAL HIP ARTHROPLASTY    . TOTAL KNEE ARTHROPLASTY  01/22/2013   Procedure: TOTAL KNEE ARTHROPLASTY;  Surgeon: Gearlean Alf, MD;  Location: [facility];  Service: Orthopedics;  Laterality: Right;  Revision of a Right Uni Knee to a Total Knee Arthroplasty    There were no vitals filed for this visit.      Subjective Assessment - 03/30/17 0939    Subjective good and bad days, temp relief with tx/modalities   Currently in Pain? Yes   Pain Score 5    Pain Location Thoracic   Pain Orientation Left                         [facility] Adult PT Treatment/Exercise - 03/30/17 0001      Lumbar Exercises: Aerobic   Stationary Bike attempted but unable to do d/t knee restrictions   Elliptical Nustep L5 x 44min      Lumbar Exercises: Machines for Strengthening   Cybex Lumbar Extension blue tband 2 sets 10   Leg Press 30# 3 set s10   Other Lumbar Machine Exercise Rows and lats 25lb 2x10      Moist  Heat Therapy   Number Minutes Moist Heat 15 Minutes   Moist Heat Location Hip     Electrical Stimulation   Electrical Stimulation Location left iliac crest area   Electrical Stimulation Action IFC   Electrical Stimulation Goals Pain     Manual Therapy   Manual Therapy Passive ROM;Soft tissue mobilization   Passive ROM LE and trunk                  PT Short Term Goals - 03/30/17 1012      PT SHORT TERM GOAL #1   Title independent with initial HEP   Status Achieved           PT Long Term Goals - 03/30/17 1012      PT LONG TERM GOAL #1   Title go up stairs step over step without rest   Status On-going     PT LONG TERM GOAL #2   Title decrease pain 50%   Status On-going     PT LONG TERM GOAL #3   Title increase Lumbar ROM 25%   Baseline improving but guarded   Status On-going  PT LONG TERM GOAL #4   Title tolerate standing 30 minutes   Baseline varies with day   Status On-going     PT LONG TERM GOAL #5   Title decrease low back pain 50%   Status On-going             Patient will benefit from skilled therapeutic intervention in order to improve the following deficits and impairments:     Visit Diagnosis: Acute left-sided thoracic back pain  Pain in left hip  Difficulty in walking, not elsewhere classified  Acute bilateral low back pain without sciatica     Problem List Patient Active Problem List   Diagnosis Date Noted  . Excessive sleepiness 10/31/2016  . Avitaminosis D 03/30/2016  . Idiopathic peripheral neuropathy 02/23/2016  . BP (high blood pressure) 02/22/2016  . Other fatigue 10/20/2015  . Trigeminal neuralgia 03/30/2015  . 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 Polypharmacy 09/11/2014  . Other long term (current) drug therapy 09/11/2014  . Arthritis or polyarthritis, rheumatoid ([place]) 05/14/2014  . Generalized OA 05/14/2014  . Rheumatoid arthritis ([place]) 05/14/2014  . Postop Hyponatremia 01/23/2013  .  Postoperative anemia due to acute blood loss 01/23/2013  . Pain due to unicompartmental arthroplasty of knee ([place]) 01/22/2013  . 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 Mucositis oral 08/20/2012    PAYSEUR,ANGIE PTA 03/30/2017, 10:14 AM  [place], [name], [address] Phone: [phone]   Fax:  [phone]  Name: Kristen Hansen MRN: [id number] Date of Birth: 30-Jul-1946

## 2017-04-06 ENCOUNTER — Ambulatory Visit: Payer: Medicare Other | Admitting: Physical Therapy

## 2017-04-06 DIAGNOSIS — M25552 Pain in left hip: Secondary | ICD-10-CM

## 2017-04-06 DIAGNOSIS — R262 Difficulty in walking, not elsewhere classified: Secondary | ICD-10-CM

## 2017-04-06 DIAGNOSIS — M79672 Pain in left foot: Secondary | ICD-10-CM

## 2017-04-06 DIAGNOSIS — M545 Low back pain, unspecified: Secondary | ICD-10-CM

## 2017-04-06 DIAGNOSIS — M546 Pain in thoracic spine: Secondary | ICD-10-CM

## 2017-04-06 DIAGNOSIS — M25661 Stiffness of right knee, not elsewhere classified: Secondary | ICD-10-CM

## 2017-04-06 NOTE — Therapy (Signed)
[place], [name], [address] Phone: [phone]   Fax:  [phone]  Physical Therapy Treatment  Patient Details  Name: Kristen Hansen MRN: [id number] Date of Birth: [birth year] Referring Provider: Nelva Bush  Encounter Date: 04/06/2017      PT End of Session - 04/06/17 1035    PT Start Time 0933   PT Stop Time 1015   PT Time Calculation (min) 42 min   Activity Tolerance Patient tolerated treatment well   Behavior During Therapy [facility] for tasks assessed/performed      Past Medical History:  Diagnosis Date   GERD (gastroesophageal reflux disease)    Headache(784.0)    Hypercholesteremia    Hypertension    Osteoarthritis    PONV (postoperative nausea and vomiting)    Rheumatoid arthritis(714.0)    Tic [place]    Trigeminal neuralgia 03/30/2015   Vision abnormalities     Past Surgical History:  Procedure Laterality Date   ABDOMINAL HYSTERECTOMY     JOINT REPLACEMENT     right uni knee, right hip with 2 revisions   TONSILLECTOMY     TOTAL HIP ARTHROPLASTY     TOTAL KNEE ARTHROPLASTY  01/22/2013   Procedure: TOTAL KNEE ARTHROPLASTY;  Surgeon: Gearlean Alf, MD;  Location: [facility];  Service: Orthopedics;  Laterality: Right;  Revision of a Right Uni Knee to a Total Knee Arthroplasty    There were no vitals filed for this visit.      Subjective Assessment - 04/06/17 1025    Subjective Rode in a car for 8 hours earlier this week.  Felt okay while riding, and even that evening.  During the night she was awoken with increased pain and tightness throughout her Left low back.                           [facility] Adult PT Treatment/Exercise - 04/06/17 0001      Lumbar Exercises: Stretches   Passive Hamstring Stretch 3 reps;30 seconds   Lower Trunk Rotation 10 seconds;5 reps;Other (comment)   ITB Stretch 30 seconds;3 reps   Piriformis Stretch 30 seconds;3 reps     Lumbar Exercises: Standing   Other Standing Lumbar Exercises standing quadratus stretch with ball on raised table, wand overhead side bending stretch and rotation stretch   Other Standing Lumbar Exercises SGIS Right side to wall     Lumbar Exercises: Seated   Other Seated Lumbar Exercises Seated Right lumbar elongation with p ball     Modalities   Modalities --  TENS setup/ educationx10'     Manual Therapy   Manual Therapy Passive ROM;Soft tissue mobilization   Manual therapy comments trigger point release, palmar spreading, ischemic compression to lumbar paraspinals   Soft tissue mobilization Left lumbar and quadratus   Passive ROM LE and trunk                  PT Short Term Goals - 03/30/17 1012      PT SHORT TERM GOAL #1   Title independent with initial HEP   Status Achieved           PT Long Term Goals - 03/30/17 1012      PT LONG TERM GOAL #1   Title go up stairs step over step without rest   Status On-going     PT LONG TERM GOAL #2   Title decrease pain 50%  Status On-going     PT LONG TERM GOAL #3   Title increase Lumbar ROM 25%   Baseline improving but guarded   Status On-going     PT LONG TERM GOAL #4   Title tolerate standing 30 minutes   Baseline varies with day   Status On-going     PT LONG TERM GOAL #5   Title decrease low back pain 50%   Status On-going               Plan - 04/06/17 1038    Clinical Impression Statement Treatment focus on elongating Right trunk d/t scoliosis and resultant adaptive tissue shortening.  Additionally, time was spent educating patient on use of a TENS unit that she recently aquired.  We went over pad placement, proper setting, wear time, and dos and don'ts for wearing her unit.        Patient will benefit from skilled therapeutic intervention in order to improve the following deficits and impairments:     Visit Diagnosis: Acute left-sided thoracic back pain  Pain in left hip  Difficulty in  walking, not elsewhere classified  Acute bilateral low back pain without sciatica  Knee stiffness, right  Left foot pain     Problem List Patient Active Problem List   Diagnosis Date Noted   Excessive sleepiness 10/31/2016   Avitaminosis D 03/30/2016   Idiopathic peripheral neuropathy 02/23/2016   BP (high blood pressure) 02/22/2016   Other fatigue 10/20/2015   Trigeminal neuralgia 03/30/2015   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   Polypharmacy 09/11/2014   Other long term (current) drug therapy 09/11/2014   Arthritis or polyarthritis, rheumatoid ([place]) 05/14/2014   Generalized OA 05/14/2014   Rheumatoid arthritis ([place]) 05/14/2014   Postop Hyponatremia 01/23/2013   Postoperative anemia due to acute blood loss 01/23/2013   Pain due to unicompartmental arthroplasty of knee ([place]) 01/22/2013   Pain due to knee joint prosthesis ([place]) 01/22/2013   Mucositis oral 08/20/2012    Olean Ree, PTA 04/06/2017, 10:43 AM  [place], [name], [address] Phone: [phone]   Fax:  [phone]  Name: KEYLIN FERRYMAN MRN: [id number] Date of Birth: 07-23-1946

## 2017-04-13 ENCOUNTER — Ambulatory Visit: Payer: Medicare Other | Admitting: Physical Therapy

## 2017-04-13 DIAGNOSIS — M546 Pain in thoracic spine: Secondary | ICD-10-CM | POA: Diagnosis not present

## 2017-04-13 DIAGNOSIS — M25661 Stiffness of right knee, not elsewhere classified: Secondary | ICD-10-CM

## 2017-04-13 DIAGNOSIS — M79672 Pain in left foot: Secondary | ICD-10-CM

## 2017-04-13 DIAGNOSIS — M545 Low back pain, unspecified: Secondary | ICD-10-CM

## 2017-04-13 DIAGNOSIS — M25552 Pain in left hip: Secondary | ICD-10-CM

## 2017-04-13 DIAGNOSIS — R262 Difficulty in walking, not elsewhere classified: Secondary | ICD-10-CM

## 2017-04-13 NOTE — Therapy (Signed)
[place], [name], [address] Phone: [phone]   Fax:  [phone]  Physical Therapy Treatment  Patient Details  Name: Kristen Hansen MRN: [id number] Date of Birth: Sep 06, 1946 Referring Provider: Nelva Bush  Encounter Date: 04/13/2017      PT End of Session - 04/13/17 1205    PT Start Time 4270   PT Stop Time 1100   PT Time Calculation (min) 45 min   Activity Tolerance Patient tolerated treatment well   Behavior During Therapy [facility] for tasks assessed/performed      Past Medical History:  Diagnosis Date   GERD (gastroesophageal reflux disease)    Headache(784.0)    Hypercholesteremia    Hypertension    Osteoarthritis    PONV (postoperative nausea and vomiting)    Rheumatoid arthritis(714.0)    Tic [place]    Trigeminal neuralgia 03/30/2015   Vision abnormalities     Past Surgical History:  Procedure Laterality Date   ABDOMINAL HYSTERECTOMY     JOINT REPLACEMENT     right uni knee, right hip with 2 revisions   TONSILLECTOMY     TOTAL HIP ARTHROPLASTY     TOTAL KNEE ARTHROPLASTY  01/22/2013   Procedure: TOTAL KNEE ARTHROPLASTY;  Surgeon: Gearlean Alf, MD;  Location: [facility];  Service: Orthopedics;  Laterality: Right;  Revision of a Right Uni Knee to a Total Knee Arthroplasty    There were no vitals filed for this visit.      Subjective Assessment - 04/13/17 1201    Subjective Feeling pretty good today, but hasn't been doing much aside from sitting and laying around.  Saw Dr. Tonita Cong earlier this week.  Is scheduled for a lumbar injection on May 1st   Currently in Pain? Yes   Pain Score 3                          [facility] Adult PT Treatment/Exercise - 04/13/17 0001      Lumbar Exercises: Stretches   Passive Hamstring Stretch 3 reps;30 seconds   Lower Trunk Rotation 10 seconds;5 reps;Other (comment)   ITB Stretch 30 seconds;3 reps   Piriformis Stretch  30 seconds;3 reps     Lumbar Exercises: Standing   Other Standing Lumbar Exercises standing quadratus stretch with ball on raised table, wand overhead side bending stretch and rotation stretch   Other Standing Lumbar Exercises SGIS Right side to wall     Lumbar Exercises: Seated   Other Seated Lumbar Exercises Seated Right lumbar elongation with p ball     Lumbar Exercises: Supine   Bridge 10 reps;5 seconds   Bridge Limitations 2 sets feet on ball     Lumbar Exercises: Sidelying   Clam 10 reps   Clam Limitations 3# ea B     Manual Therapy   Manual Therapy Passive ROM;Soft tissue mobilization   Manual therapy comments trigger point release, palmar spreading, ischemic compression to lumbar paraspinals   Soft tissue mobilization right lumbar and quadratus   Passive ROM LE and trunk                  PT Short Term Goals - 03/30/17 1012      PT SHORT TERM GOAL #1   Title independent with initial HEP   Status Achieved           PT Long Term Goals - 03/30/17 1012      PT  LONG TERM GOAL #1   Title go up stairs step over step without rest   Status On-going     PT LONG TERM GOAL #2   Title decrease pain 50%   Status On-going     PT LONG TERM GOAL #3   Title increase Lumbar ROM 25%   Baseline improving but guarded   Status On-going     PT LONG TERM GOAL #4   Title tolerate standing 30 minutes   Baseline varies with day   Status On-going     PT LONG TERM GOAL #5   Title decrease low back pain 50%   Status On-going               Plan - 04/13/17 1203    Clinical Impression Statement Cont to focus treatment on Right trunk elongation and maximizing flexibility.  Did well with gentle lumbopelvic stabilization exercises.        Patient will benefit from skilled therapeutic intervention in order to improve the following deficits and impairments:     Visit Diagnosis: Acute left-sided thoracic back pain  Pain in left hip  Difficulty in walking, not  elsewhere classified  Acute bilateral low back pain without sciatica  Knee stiffness, right  Left foot pain     Problem List Patient Active Problem List   Diagnosis Date Noted   Excessive sleepiness 10/31/2016   Avitaminosis D 03/30/2016   Idiopathic peripheral neuropathy 02/23/2016   BP (high blood pressure) 02/22/2016   Other fatigue 10/20/2015   Trigeminal neuralgia 03/30/2015   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) 05/14/2014   Generalized OA 05/14/2014   Rheumatoid arthritis ([place]) 05/14/2014   Postop Hyponatremia 01/23/2013   Postoperative anemia due to acute blood loss 01/23/2013   Pain due to unicompartmental arthroplasty of knee ([place]) 01/22/2013   Pain due to knee joint prosthesis ([place]) 01/22/2013   Mucositis oral 08/20/2012    Olean Ree, PTA 04/13/2017, 12:06 PM  [place], [name], [address] Phone: [phone]   Fax:  [phone]  Name: Kristen Hansen MRN: [id number] Date of Birth: Sep 14, 1946

## 2017-04-19 ENCOUNTER — Ambulatory Visit: Payer: Medicare Other | Admitting: Physical Therapy

## 2017-04-27 ENCOUNTER — Ambulatory Visit: Payer: Medicare Other | Admitting: Physical Therapy

## 2017-04-30 ENCOUNTER — Encounter (INDEPENDENT_AMBULATORY_CARE_PROVIDER_SITE_OTHER): Payer: Self-pay

## 2017-04-30 ENCOUNTER — Ambulatory Visit (INDEPENDENT_AMBULATORY_CARE_PROVIDER_SITE_OTHER): Payer: Medicare Other | Admitting: Neurology

## 2017-04-30 ENCOUNTER — Encounter: Payer: Self-pay | Admitting: Neurology

## 2017-04-30 ENCOUNTER — Encounter: Payer: Self-pay | Admitting: *Deleted

## 2017-04-30 VITALS — BP 135/78 | HR 94 | Resp 18 | Ht 69.5 in | Wt 188.5 lb

## 2017-04-30 DIAGNOSIS — G500 Trigeminal neuralgia: Secondary | ICD-10-CM

## 2017-04-30 DIAGNOSIS — M4690 Unspecified inflammatory spondylopathy, site unspecified: Secondary | ICD-10-CM | POA: Diagnosis not present

## 2017-04-30 DIAGNOSIS — G4752 REM sleep behavior disorder: Secondary | ICD-10-CM

## 2017-04-30 DIAGNOSIS — M47819 Spondylosis without myelopathy or radiculopathy, site unspecified: Secondary | ICD-10-CM

## 2017-04-30 DIAGNOSIS — M069 Rheumatoid arthritis, unspecified: Secondary | ICD-10-CM

## 2017-04-30 DIAGNOSIS — M47899 Other spondylosis, site unspecified: Secondary | ICD-10-CM | POA: Insufficient documentation

## 2017-04-30 MED ORDER — CLONAZEPAM 0.5 MG PO TABS: 0.5000 mg | ORAL_TABLET | Freq: Every day | ORAL | 5 refills | Status: DC

## 2017-04-30 MED ORDER — METHYLPREDNISOLONE 4 MG PO TBPK: ORAL_TABLET | ORAL | 0 refills | Status: DC

## 2017-04-30 NOTE — Progress Notes (Signed)
[facility]  PATIENT: Kristen Hansen DOB: 04/05/1946  REFERRING DOCTOR OR PCP:  Reita Cliche SOURCE: patient  _________________________________   HISTORICAL  CHIEF COMPLAINT:  Chief Complaint  Patient presents with  . 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  Sleep is about hte same.  Today she has new c/o: 1--itching/tingling top of head daily around 4pm, that resolves after taking Oxcarbazepine. 2--numbness in all fingers is worse. 3--left sided low back pain.  Sts. MRIs show ddd and spinal stenosis.  Only min. relief of pain after having an esi by Dr. Nelva Bush at [facility] last wk/fim  . Sleep Distorder    HISTORY OF PRESENT ILLNESS:  Kristen Hansen is a 71 yo woman with Trigeminal neuralgia x many years.      Trigeminal Neuralgia pain:   She had a left facial Mohs procedure for basal cell and TN pain worsened x several weeks.   It is now back to baseline.  When pain worsens, she takes an extra oxcarbazepine and lamotrigine.   When present, most pain is located in the left V2 distribution though she gets some V1 dysesthesia as well.   Stress sometimes triggers one of the more severe episodes      Oxcarbazapine 300 mg tid and lamotrigine 50 mg tid.      She has not needed any opiates recently.    She also has pain in the left scalp and left ear that is different than the TN pain.  She also has an itchy scalp sensation at times, better if she takes an extra pill.        Sleep/RBD:   She still has dreams where she is running away from someone and she sometimes has talking in her sleep but no recent hitting and thrashing (and only got out of bed once in last 5-6 months).  Spells usually occur at 3 -4 am and never the first couple hours of sleep. She does much better on clonazepam.     She denies tremor, rigidity or other PD like symptoms.   She has no definite cognitive issue.   Gait has worsened  slightly in last 2 years with poor balance and 2-3 falls.  She has had more sleepiness lately but when resting not while driving or other activities   PSG was performed many years ago and did no show any OSA.    Fatigue:  She notes fatigue the past year that fluctuates a lot .  Some days are worse.    She was on Provigil but did not tolerate it.    Tremor:   She has a very mild tremor in hr hands but it is not affecting hr writing.  Sometimes she had trouble with silverware and recently got some with a rounder handle with benefit.    Rheumatoid Arthritis:   She has hand and hip arthritis and takes Celebrex and Enbrel.   The right middle PIPis very inflamed and may need replacement.  Knees hurt.   She has had right TKR and right THR   Her rheumatologist (Dr. Lenna Gilford in [facility]).  Plaquenil was recently added,    LBP:  She has axial left back pain that radiates an inch or two towards the hip.  Pain is worse when she stands up for while or walks pain is generally minimal when she is bending forward or with sitting.    2012 L-spine  MRI showed  facet hypertrophy, worse at L3L4 and L4L5 and L2, L3, L3L4 and L4L5 spinal stenosis.   There is mild anterolisthesis of L4 upon L5 and mild retrolisthesis of L5 upon S1.   She sees Dr. Nelva Bush who recently did an ESI without benefit.   She ses him back soon.      REVIEW OF SYSTEMS: Constitutional: No fevers, chills, sweats, or change in appetite.  Notes some fatigue Eyes: No visual changes, double vision, eye pain Ear, nose and throat: No hearing loss, ear pain, nasal congestion, sore throat Cardiovascular: No chest pain, palpitations Respiratory: No shortness of breath at rest or with exertion.   No wheezes GastrointestinaI: Has constipation.   No nausea, vomiting, diarrhea, abdominal pain, fecal incontinence Genitourinary: No dysuria, urinary retention or frequency.  No nocturia. Musculoskeletal:as above Integumentary: No rash, pruritus, skin  lesions Neurological: as above Psychiatric: No depression at this time.  No anxiety Endocrine: No palpitations, diaphoresis, change in appetite, change in weigh or increased thirst Hematologic/Lymphatic: No anemia, purpura, petechiae. Allergic/Immunologic: No itchy/runny eyes, nasal congestion, recent allergic reactions, rashes  ALLERGIES: Allergies  Allergen Reactions  . Morphine And Related Hives  . Prevacid [Lansoprazole] Other (See Comments)    unknown  . Provigil [Modafinil]     dizzyness  . Adhesive [Tape] Rash    HOME MEDICATIONS:  Current Outpatient Prescriptions:  .  acetaminophen (TYLENOL) 650 MG CR tablet, Take 1,300 mg by mouth every 8 (eight) hours as needed. Pain, Disp: , Rfl:  .  amLODipine-olmesartan (AZOR) 5-40 MG per tablet, Take 1 tablet by mouth every evening. , Disp: , Rfl:  .  cevimeline (EVOXAC) 30 MG capsule, Reported on 04/13/2016, Disp: , Rfl:  .  clonazePAM (KLONOPIN) 0.5 MG tablet, Take 1 tablet (0.5 mg total) by mouth at bedtime., Disp: 30 tablet, Rfl: 5 .  Coenzyme Q10 (CO Q 10) 100 MG CAPS, Take by mouth., Disp: , Rfl:  .  etanercept (ENBREL SURECLICK) 50 MG/ML injection, INJECT 1ML (50MG ) UNDER THE SKIN ONCE PER WEEK, Disp: , Rfl:  .  etodolac (LODINE) 400 MG tablet, TAKE ONE TABLET BY MOUTH TWICE DAILY, Disp: 60 tablet, Rfl: 5 .  hydroxychloroquine (PLAQUENIL) 200 MG tablet, Take 200 mg by mouth daily., Disp: , Rfl:  .  hydroxypropyl methylcellulose (ISOPTO TEARS) 2.5 % ophthalmic solution, Place 1 drop into both eyes as needed. Dry eyes, Disp: , Rfl:  .  imipramine (TOFRANIL) 25 MG tablet, Take 1 tablet (25 mg total) by mouth at bedtime., Disp: 30 tablet, Rfl: 11 .  lamoTRIgine (LAMICTAL) 100 MG tablet, Take 1 tablet (100 mg total) by mouth 2 (two) times daily., Disp: 60 tablet, Rfl: 11 .  loratadine (CLARITIN) 10 MG tablet, Take 10 mg by mouth daily., Disp: , Rfl:  .  montelukast (SINGULAIR) 10 MG tablet, Take 10 mg by mouth at bedtime., Disp: ,  Rfl:  .  omeprazole (PRILOSEC) 20 MG capsule, Take 20 mg by mouth 2 (two) times daily. , Disp: , Rfl:  .  Oxcarbazepine (TRILEPTAL) 300 MG tablet, Take 1-2 tablets (300-600 mg total) by mouth 3 (three) times daily. 1 in the morning 1 at noon and 2 at bedtime, Disp: 90 tablet, Rfl: 11 .  pravastatin (PRAVACHOL) 40 MG tablet, Take 40 mg by mouth daily before breakfast. , Disp: , Rfl:  .  pravastatin (PRAVACHOL) 40 MG tablet, TAKE ONE TABLET BY MOUTH ONCE DAILY, Disp: , Rfl:  .  tiZANidine (ZANAFLEX) 4 MG tablet, , Disp: , Rfl:  .  methylPREDNISolone (MEDROL DOSEPAK) 4 MG TBPK tablet, Take as directed over 6 days, Disp: 21 tablet, Rfl: 0  PAST MEDICAL HISTORY: Past Medical History:  Diagnosis Date  . GERD (gastroesophageal reflux disease)   . Headache(784.0)   . Hypercholesteremia   . Hypertension   . Osteoarthritis   . PONV (postoperative nausea and vomiting)   . Rheumatoid arthritis(714.0)   . 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 Trigeminal neuralgia 03/30/2015  . Vision abnormalities     PAST SURGICAL HISTORY: Past Surgical History:  Procedure Laterality Date  . ABDOMINAL HYSTERECTOMY    . JOINT REPLACEMENT     right uni knee, right hip with 2 revisions  . TONSILLECTOMY    . TOTAL HIP ARTHROPLASTY    . TOTAL KNEE ARTHROPLASTY  01/22/2013   Procedure: TOTAL KNEE ARTHROPLASTY;  Surgeon: Gearlean Alf, MD;  Location: [facility];  Service: Orthopedics;  Laterality: Right;  Revision of a Right Uni Knee to a Total Knee Arthroplasty    FAMILY HISTORY: Family History  Problem Relation Age of Onset  . Heart disease Mother   . Parkinson's disease Father     SOCIAL HISTORY:  Social History   Social History  . Marital status: Married    Spouse name: N/A  . Number of children: N/A  . Years of education: N/A   Occupational History  . Not on file.   Social History Main Topics  . Smoking status: Never Smoker  . Smokeless tobacco: Never Used  . Alcohol use 0.0 oz/week     Comment: occasional  .  Drug use: No  . Sexual activity: Not on file   Other Topics Concern  . Not on file   Social History Narrative  . No narrative on file     PHYSICAL EXAM  Vitals:   04/30/17 1107  BP: 135/78  Pulse: 94  Resp: 18  Weight: 188 lb 8 oz (85.5 kg)  Height: 5' 9.5" (1.765 m)    Body mass index is 27.44 kg/m.   General: The patient is well-developed and well-nourished and in no acute distress   Neurologic Exam  Mental status: The patient is alert and oriented x 3 at the time of the examination. The patient has apparent normal recent and remote memory, with an apparently normal attention span and concentration ability.   Speech is normal.  Cranial nerves: Extraocular movements are full.    Facial symmetry is present. There is good facial sensation to soft touch bilaterally.Facial strength is normal.  Trapezius and sternocleidomastoid strength is normal. No dysarthria is noted.    No obvious hearing deficits are noted.  Motor:  No tremor.  Muscle bulk is normal.   Tone is normal. Strength is  5 / 5 in all 4 extremities.   Sensory: Sensory testing is intact to pinprick, soft touch and vibration sensation in all 4 extremities.  Coordination: Cerebellar testing reveals good finger-nose-finger bilaterally.  Gait and station: Station is normal.   Gait is normal. 3 step 180 degree turn.  She has 2-3 steps retropulsion when gait disturbed (stable).   Tandem gait is mildly wide. Romberg is negative.   Reflexes: Deep tendon reflexes are symmetric and normal bilaterally.      DIAGNOSTIC DATA (LABS, IMAGING, TESTING) - I reviewed patient records, labs, notes, testing and imaging myself where available.  Lab Results  Component Value Date   WBC 7.1 01/25/2013   HGB 9.0 (L) 01/25/2013   HCT 26.5 (L) 01/25/2013   MCV 90.8 01/25/2013  PLT 194 01/25/2013      Component Value Date/Time   NA 131 (L) 01/24/2013 0410   K 3.7 01/24/2013 0410   CL 98 01/24/2013 0410   CO2 25 01/24/2013  0410   GLUCOSE 123 (H) 01/24/2013 0410   BUN 9 01/24/2013 0410   CREATININE 0.67 01/24/2013 0410   CALCIUM 8.2 (L) 01/24/2013 0410   PROT 7.4 01/16/2013 1435   ALBUMIN 3.4 (L) 01/16/2013 1435   AST 13 01/16/2013 1435   ALT 12 01/16/2013 1435   ALKPHOS 118 (H) 01/16/2013 1435   BILITOT 0.1 (L) 01/16/2013 1435   GFRNONAA 90 (L) 01/24/2013 0410   GFRAA >90 01/24/2013 0410       ASSESSMENT AND PLAN  Trigeminal neuralgia  REM sleep behavior disorder  Rheumatoid arthritis, involving unspecified site, unspecified rheumatoid factor presence (HCC)  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   Continue oxcarbazepine and lamotrigine for trigeminal neuralgia 2.   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  She has been stable for 7 years   She doesn't have evidence of any of any synucleinopathy.    3.    She is seeing Dr. Nelva Bush for her back. She might benefit from a medial branch block/RFA. 4.    rtc 6 months or sooner if she has new worsening neurologic symptoms.    A. Felecia Shelling, MD, PhD 12/30/1094, 0:45 PM Certified in Neurology, Clinical Neurophysiology, Sleep Medicine, Pain Medicine and Neuroimaging  [facility] [address], [place], [place] [address] [phone]

## 2017-05-25 ENCOUNTER — Other Ambulatory Visit: Payer: Self-pay | Admitting: Neurology

## 2017-05-26 ENCOUNTER — Other Ambulatory Visit: Payer: Self-pay | Admitting: Neurology

## 2017-09-26 ENCOUNTER — Other Ambulatory Visit: Payer: Self-pay | Admitting: Internal Medicine

## 2017-09-26 DIAGNOSIS — Z1231 Encounter for screening mammogram for malignant neoplasm of breast: Secondary | ICD-10-CM

## 2017-10-15 ENCOUNTER — Ambulatory Visit
Admission: RE | Admit: 2017-10-15 | Discharge: 2017-10-15 | Disposition: A | Discharge status: Home / Self Care | Payer: Medicare Other | Source: Ambulatory Visit | Attending: Internal Medicine | Admitting: Internal Medicine

## 2017-10-15 DIAGNOSIS — Z1231 Encounter for screening mammogram for malignant neoplasm of breast: Secondary | ICD-10-CM

## 2017-10-31 ENCOUNTER — Ambulatory Visit: Payer: Medicare Other | Admitting: Neurology

## 2017-10-31 ENCOUNTER — Encounter: Payer: Self-pay | Admitting: Neurology

## 2017-10-31 VITALS — BP 163/84 | HR 93 | Resp 16 | Wt 198.0 lb

## 2017-10-31 DIAGNOSIS — G500 Trigeminal neuralgia: Secondary | ICD-10-CM | POA: Diagnosis not present

## 2017-10-31 DIAGNOSIS — M069 Rheumatoid arthritis, unspecified: Secondary | ICD-10-CM | POA: Diagnosis not present

## 2017-10-31 DIAGNOSIS — M47899 Other spondylosis, site unspecified: Secondary | ICD-10-CM

## 2017-10-31 DIAGNOSIS — M47819 Spondylosis without myelopathy or radiculopathy, site unspecified: Secondary | ICD-10-CM

## 2017-10-31 DIAGNOSIS — M4690 Unspecified inflammatory spondylopathy, site unspecified: Secondary | ICD-10-CM | POA: Diagnosis not present

## 2017-10-31 MED ORDER — TRAMADOL HCL 50 MG PO TABS: 50.0000 mg | ORAL_TABLET | Freq: Three times a day (TID) | ORAL | 2 refills | Status: DC | PRN

## 2017-10-31 NOTE — Progress Notes (Signed)
[facility]  PATIENT: Kristen Hansen DOB: 12/30/1945  REFERRING DOCTOR OR PCP:  Reita Cliche SOURCE: patient  _________________________________   HISTORICAL  CHIEF COMPLAINT:  Chief Complaint  Patient presents with  . Trigeminal Neuralgia    Sts. has not had any recent exacerbations of trigeminal neuralgia.  Here today for eval of left sided loewr back, ? left hip pain.  Onset 1 yr. ago with no known injury. Has seen Dr. Nelva Bush at [facility]. Sts. she had 4 inj.--? esi's that helped some, not enough.  Has also seen NS--Dr. Ronnald Ramp at [facility] and sts. surgery was recommended.  Would like RAS opinion/fim  . Back Pain    HISTORY OF PRESENT ILLNESS:  Kristen Hansen is a 71 yo woman with Trigeminal neuralgia x many years.      Update 10/31/2017:     She reports much more back pain almost entirely in the left lower lumbar region and a little more lateral. She denies any pain that radiates into the left leg. At times she will have mild his comfort in the right proximal leg though she has also had multiple orthopedic procedures on that side. When she is sitting, she does not note any significant pain. If she stands for a while or if she walks she will have more of the pain on the left. It intensifies and becomes very uncomfortable after a while. She has spinal stenosis at multiple levels. I personally reviewed the MRI of the lumbar spine. It shows scoliosis in the lumbar spine. Additionally she has moderate spinal stenosis at L2-L3 and moderately severe spinal stenosis at L3-L4 and L4-L5 (associated with anterolisthesis at this level) and mild to moderate spinal stenosis at L5-S1.    She has facet hypertrophy at every level in the lumbar spine, worse on the left at L3-L4, L4-L5 and L5-S1.  She is seeing Dr. Nelva Bush of orthopedics and Dr. Ronnald Ramp of neurosurgery.   She did not get any significant benefit from several epidural steroid injections.   Lumbar decompression and fusion  surgery has been recommended.  Her trigeminal neuralgia is doing well and she has had no recent flareups.   For TN, she is on oxcabazepine 300 mg x 3-4 and lamotrigine 50 po bid and imipramine.    _________________________________________ From 04/30/2017:  Trigeminal Neuralgia pain:   She had a left facial Mohs procedure for basal cell and TN pain worsened x several weeks.   It is now back to baseline.  When pain worsens, she takes an extra oxcarbazepine and lamotrigine.   When present, most pain is located in the left V2 distribution though she gets some V1 dysesthesia as well.   Stress sometimes triggers one of the more severe episodes      Oxcarbazapine 300 mg tid and lamotrigine 50 mg tid.      She has not needed any opiates recently.    She also has pain in the left scalp and left ear that is different than the TN pain.  She also has an itchy scalp sensation at times, better if she takes an extra pill.        Sleep/RBD:   She still has dreams where she is running away from someone and she sometimes has talking in her sleep but no recent hitting and thrashing (and only got out of bed once in last 5-6 months).  Spells usually occur at 3 -4 am and never the first couple hours of sleep. She does much better on  clonazepam.     She denies tremor, rigidity or other PD like symptoms.   She has no definite cognitive issue.   Gait has worsened slightly in last 2 years with poor balance and 2-3 falls.  She has had more sleepiness lately but when resting not while driving or other activities   PSG was performed many years ago and did no show any OSA.    Fatigue:  She notes fatigue the past year that fluctuates a lot .  Some days are worse.    She was on Provigil but did not tolerate it.    Tremor:   She has a very mild tremor in hr hands but it is not affecting hr writing.  Sometimes she had trouble with silverware and recently got some with a rounder handle with benefit.    Rheumatoid Arthritis:   She has hand  and hip arthritis and takes Celebrex and Enbrel.   The right middle PIPis very inflamed and may need replacement.  Knees hurt.   She has had right TKR and right THR   Her rheumatologist (Dr. Lenna Gilford in [facility]).  Plaquenil was recently added,    LBP:  She has axial left back pain that radiates an inch or two towards the hip.  Pain is worse when she stands up for while or walks pain is generally minimal when she is bending forward or with sitting.    2012 L-spine MRI showed  facet hypertrophy, worse at L3L4 and L4L5 and L2, L3, L3L4 and L4L5 spinal stenosis.   There is mild anterolisthesis of L4 upon L5 and mild retrolisthesis of L5 upon S1.   She sees Dr. Nelva Bush who recently did an ESI without benefit.   She ses him back soon.      REVIEW OF SYSTEMS: Constitutional: No fevers, chills, sweats, or change in appetite.  Notes some fatigue Eyes: No visual changes, double vision, eye pain Ear, nose and throat: No hearing loss, ear pain, nasal congestion, sore throat Cardiovascular: No chest pain, palpitations Respiratory: No shortness of breath at rest or with exertion.   No wheezes GastrointestinaI: Has constipation.   No nausea, vomiting, diarrhea, abdominal pain, fecal incontinence Genitourinary: No dysuria, urinary retention or frequency.  No nocturia. Musculoskeletal:as above Integumentary: No rash, pruritus, skin lesions Neurological: as above Psychiatric: No depression at this time.  No anxiety Endocrine: No palpitations, diaphoresis, change in appetite, change in weigh or increased thirst Hematologic/Lymphatic: No anemia, purpura, petechiae. Allergic/Immunologic: No itchy/runny eyes, nasal congestion, recent allergic reactions, rashes  ALLERGIES: Allergies  Allergen Reactions  . Morphine And Related Hives  . Prevacid [Lansoprazole] Other (See Comments)    unknown  . Provigil [Modafinil]     dizzyness  . Adhesive [Tape] Rash    HOME MEDICATIONS:  Current Outpatient Medications:  .   acetaminophen (TYLENOL) 650 MG CR tablet, Take 1,300 mg by mouth every 8 (eight) hours as needed. Pain, Disp: , Rfl:  .  amLODipine-olmesartan (AZOR) 5-40 MG per tablet, Take 1 tablet by mouth every evening. , Disp: , Rfl:  .  cevimeline (EVOXAC) 30 MG capsule, Reported on 04/13/2016, Disp: , Rfl:  .  clonazePAM (KLONOPIN) 0.5 MG tablet, Take 1 tablet (0.5 mg total) by mouth at bedtime., Disp: 30 tablet, Rfl: 5 .  Coenzyme Q10 (CO Q 10) 100 MG CAPS, Take by mouth., Disp: , Rfl:  .  etanercept (ENBREL SURECLICK) 50 MG/ML injection, INJECT 1ML (50MG ) UNDER THE SKIN ONCE PER WEEK, Disp: , Rfl:  .  etodolac (LODINE) 400 MG tablet, TAKE ONE TABLET BY MOUTH TWICE DAILY, Disp: 60 tablet, Rfl: 5 .  hydroxychloroquine (PLAQUENIL) 200 MG tablet, Take 200 mg by mouth daily., Disp: , Rfl:  .  hydroxypropyl methylcellulose (ISOPTO TEARS) 2.5 % ophthalmic solution, Place 1 drop into both eyes as needed. Dry eyes, Disp: , Rfl:  .  imipramine (TOFRANIL) 25 MG tablet, Take 1 tablet (25 mg total) by mouth at bedtime., Disp: 30 tablet, Rfl: 11 .  lamoTRIgine (LAMICTAL) 100 MG tablet, Take 1 tablet (100 mg total) by mouth 2 (two) times daily., Disp: 60 tablet, Rfl: 11 .  loratadine (CLARITIN) 10 MG tablet, Take 10 mg by mouth daily., Disp: , Rfl:  .  methylPREDNISolone (MEDROL DOSEPAK) 4 MG TBPK tablet, Take as directed over 6 days, Disp: 21 tablet, Rfl: 0 .  montelukast (SINGULAIR) 10 MG tablet, Take 10 mg by mouth at bedtime., Disp: , Rfl:  .  omeprazole (PRILOSEC) 20 MG capsule, Take 20 mg by mouth 2 (two) times daily. , Disp: , Rfl:  .  Oxcarbazepine (TRILEPTAL) 300 MG tablet, TAKE ONE TABLET BY MOUTH IN THE MORNING, 1 AT NOON AND 2 AT BEDTIME, Disp: 120 tablet, Rfl: 8 .  pravastatin (PRAVACHOL) 40 MG tablet, Take 40 mg by mouth daily before breakfast. , Disp: , Rfl:  .  pravastatin (PRAVACHOL) 40 MG tablet, TAKE ONE TABLET BY MOUTH ONCE DAILY, Disp: , Rfl:  .  tiZANidine (ZANAFLEX) 4 MG tablet, , Disp: , Rfl:  .   traMADol (ULTRAM) 50 MG tablet, Take 1 tablet (50 mg total) every 8 (eight) hours as needed by mouth., Disp: 90 tablet, Rfl: 2  PAST MEDICAL HISTORY: Past Medical History:  Diagnosis Date  . GERD (gastroesophageal reflux disease)   . Headache(784.0)   . Hypercholesteremia   . Hypertension   . Osteoarthritis   . PONV (postoperative nausea and vomiting)   . Rheumatoid arthritis(714.0)   . 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 Trigeminal neuralgia 03/30/2015  . Vision abnormalities     PAST SURGICAL HISTORY: Past Surgical History:  Procedure Laterality Date  . ABDOMINAL HYSTERECTOMY    . AUGMENTATION MAMMAPLASTY Bilateral   . JOINT REPLACEMENT     right uni knee, right hip with 2 revisions  . TONSILLECTOMY    . TOTAL HIP ARTHROPLASTY      FAMILY HISTORY: Family History  Problem Relation Age of Onset  . Heart disease Mother   . Parkinson's disease Father     SOCIAL HISTORY:  Social History   Socioeconomic History  . Marital status: Married    Spouse name: Not on file  . Number of children: Not on file  . Years of education: Not on file  . Highest education level: Not on file  Social Needs  . Financial resource strain: Not on file  . Food insecurity - worry: Not on file  . Food insecurity - inability: Not on file  . Transportation needs - medical: Not on file  . Transportation needs - non-medical: Not on file  Occupational History  . Not on file  Tobacco Use  . Smoking status: Never Smoker  . Smokeless tobacco: Never Used  Substance and Sexual Activity  . Alcohol use: Yes    Alcohol/week: 0.0 oz    Comment: occasional  . Drug use: No  . Sexual activity: Not on file  Other Topics Concern  . Not on file  Social History Narrative  . Not on file     PHYSICAL EXAM  Vitals:   10/31/17 1614  BP: (!) 163/84  Pulse: 93  Resp: 16  Weight: 198 lb (89.8 kg)    Body mass index is 28.82 kg/m.   General: The patient is well-developed and well-nourished and in no acute  distress   Neurologic Exam  Mental status: The patient is alert and oriented x 3 at the time of the examination. The patient has apparent normal recent and remote memory, with an apparently normal attention span and concentration ability.   Speech is normal.  Cranial nerves: Extraocular movements are full.    Facial strength and sensation is normal.. No dysarthria is noted.    No obvious hearing deficits are noted.  Motor:  No tremor.  Muscle bulk is normal.   Tone is normal. Strength is  5 / 5 in all 4 extremities.   Sensory: Sensory testing is intact to pinprick, soft touch and vibration sensation in all 4 extremities.  Coordination: Cerebellar testing reveals good finger-nose-finger bilaterally.  Gait and station: Station is normal.   Gait is normal. Tandem gait is mildly wide. She can turn 180 in 3 steps.. Romberg is negative.   Reflexes: Deep tendon reflexes are symmetric and normal bilaterally.      DIAGNOSTIC DATA (LABS, IMAGING, TESTING) - I reviewed patient records, labs, notes, testing and imaging myself where available.  Lab Results  Component Value Date   WBC 7.1 01/25/2013   HGB 9.0 (L) 01/25/2013   HCT 26.5 (L) 01/25/2013   MCV 90.8 01/25/2013   PLT 194 01/25/2013      Component Value Date/Time   NA 131 (L) 01/24/2013 0410   K 3.7 01/24/2013 0410   CL 98 01/24/2013 0410   CO2 25 01/24/2013 0410   GLUCOSE 123 (H) 01/24/2013 0410   BUN 9 01/24/2013 0410   CREATININE 0.67 01/24/2013 0410   CALCIUM 8.2 (L) 01/24/2013 0410   PROT 7.4 01/16/2013 1435   ALBUMIN 3.4 (L) 01/16/2013 1435   AST 13 01/16/2013 1435   ALT 12 01/16/2013 1435   ALKPHOS 118 (H) 01/16/2013 1435   BILITOT 0.1 (L) 01/16/2013 1435   GFRNONAA 90 (L) 01/24/2013 0410   GFRAA >90 01/24/2013 0410       ASSESSMENT AND PLAN  Rheumatoid arthritis, involving unspecified site, unspecified rheumatoid factor presence (HCC)  Facet syndrome (HCC)  Trigeminal neuralgia   1.   I had a long  discussion about her left sided back pain and the findings on the MRI .  She is reluctant to proceed with neurosurgery due to the long recovery period.   I discussed with her that surgery might offer the highest likelihood of success.    One other option would be to have left medial branch blocks (L3, L4, L5, sacral alar) to denervate the left L3-L4, L4-L5 and L5-S1 facet joints.   If successful she could proceed with radiofrequency ablation. This has potential to help her for many months and possibly even one year.  If there is no benefit from the medial branch block, or the RFA does not offer additional benefit, then I would recommend that she proceed with surgery.    I will also write her for tramadol. 2.   Continue oxcarbazepine and lamotrigine for trigeminal neuralgia 3.   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    rtc 6 months or sooner if she has new worsening neurologic symptoms.  30 minutes face-to-face evaluation with greater than one half the time counseling and coordinating care about her back  pain and MRI findings  Khaliyah [employer] A. Felecia Shelling, MD, PhD 95/02/6921, 3:00 PM Certified in Neurology, Clinical Neurophysiology, Sleep Medicine, Pain Medicine and Neuroimaging  [facility] [address], [place], [place] [address] [phone]

## 2017-11-26 ENCOUNTER — Other Ambulatory Visit: Payer: Self-pay | Admitting: Neurology

## 2017-12-04 ENCOUNTER — Other Ambulatory Visit: Payer: Self-pay | Admitting: Neurology

## 2018-01-31 NOTE — Progress Notes (Signed)
Please place orders in Epic as patient is being scheduled for a pre-op appointment! Thank you! 

## 2018-02-09 ENCOUNTER — Other Ambulatory Visit: Payer: Self-pay | Admitting: Neurology

## 2018-02-15 NOTE — Progress Notes (Signed)
Need orders in epic.  Preop on 02/20/2018.  Thank You

## 2018-02-17 ENCOUNTER — Ambulatory Visit: Payer: Self-pay | Admitting: Orthopedic Surgery

## 2018-02-19 ENCOUNTER — Encounter (HOSPITAL_COMMUNITY): Payer: Self-pay

## 2018-02-19 NOTE — Patient Instructions (Signed)
Your procedure is scheduled on: Wednesday, February 27, 2018   Surgery Time:  8:30AM-9:45AM   Report to [facility] Main  Entrance    Report to admitting at 6:00 AM   Call this number if you have problems the morning of surgery [phone]   Do not eat food or drink liquids :After Midnight.   Do NOT smoke after Midnight   Take these medicines the morning of surgery with A SIP OF WATER: Lamotrigine, Loratadine, Omeprazole, Trileptal, Pravastatin   Use inhaler per normal routine                               You may not have any metal on your body including hair pins, jewelry, and body piercings             Do not wear make-up, lotions, powders, perfumes/cologne, or deodorant             Do not wear nail polish.  Do not shave  48 hours prior to surgery.              Do not bring valuables to the hospital. [place].   Contacts, dentures or bridgework may not be worn into surgery.   Leave suitcase in the car. After surgery it may be brought to your room.   Special Instructions: Bring a copy of your healthcare power of attorney and living will documents         the day of surgery if you haven't scanned them in before.              Please read over the following fact sheets you were given:  [facility] - Preparing for Surgery Before surgery, you can play an important role.  Because skin is not sterile, your skin needs to be as free of germs as possible.  You can reduce the number of germs on your skin by washing with CHG (chlorahexidine gluconate) soap before surgery.  CHG is an antiseptic cleaner which kills germs and bonds with the skin to continue killing germs even after washing. Please DO NOT use if you have an allergy to CHG or antibacterial soaps.  If your skin becomes reddened/irritated stop using the CHG and inform your nurse when you arrive at Short Stay. Do not shave (including legs and underarms) for at least 48  hours prior to the first CHG shower.  You may shave your face/neck.  Please follow these instructions carefully:  1.  Shower with CHG Soap the night before surgery and the  morning of surgery.  2.  If you choose to wash your hair, wash your hair first as usual with your normal  shampoo.  3.  After you shampoo, rinse your hair and body thoroughly to remove the shampoo.                             4.  Use CHG as you would any other liquid soap.  You can apply chg directly to the skin and wash.  Gently with a scrungie or clean washcloth.  5.  Apply the CHG Soap to your body ONLY FROM THE NECK DOWN.   Do   not use on face/ open  Wound or open sores. Avoid contact with eyes, ears mouth and   genitals (private parts).                       Wash face,  Genitals (private parts) with your normal soap.             6.  Wash thoroughly, paying special attention to the area where your    surgery  will be performed.  7.  Thoroughly rinse your body with warm water from the neck down.  8.  DO NOT shower/wash with your normal soap after using and rinsing off the CHG Soap.                9.  Pat yourself dry with a clean towel.            10.  Wear clean pajamas.            11.  Place clean sheets on your bed the night of your first shower and do not  sleep with pets. Day of Surgery : Do not apply any lotions/deodorants the morning of surgery.  Please wear clean clothes to the hospital/surgery center.  FAILURE TO FOLLOW THESE INSTRUCTIONS MAY RESULT IN THE CANCELLATION OF YOUR SURGERY  PATIENT SIGNATURE_________________________________  NURSE SIGNATURE__________________________________  ________________________________________________________________________   Kristen Hansen  An incentive spirometer is a tool that can help keep your lungs clear and active. This tool measures how well you are filling your lungs with each breath. Taking long deep breaths may help reverse or  decrease the chance of developing breathing (pulmonary) problems (especially infection) following:  A long period of time when you are unable to move or be active. BEFORE THE PROCEDURE   If the spirometer includes an indicator to show your best effort, your nurse or respiratory therapist will set it to a desired goal.  If possible, sit up straight or lean slightly forward. Try not to slouch.  Hold the incentive spirometer in an upright position. INSTRUCTIONS FOR USE  1. Sit on the edge of your bed if possible, or sit up as far as you can in bed or on a chair. 2. Hold the incentive spirometer in an upright position. 3. Breathe out normally. 4. Place the mouthpiece in your mouth and seal your lips tightly around it. 5. Breathe in slowly and as deeply as possible, raising the piston or the ball toward the top of the column. 6. Hold your breath for 3-5 seconds or for as long as possible. Allow the piston or ball to fall to the bottom of the column. 7. Remove the mouthpiece from your mouth and breathe out normally. 8. Rest for a few seconds and repeat Steps 1 through 7 at least 10 times every 1-2 hours when you are awake. Take your time and take a few normal breaths between deep breaths. 9. The spirometer may include an indicator to show your best effort. Use the indicator as a goal to work toward during each repetition. 10. After each set of 10 deep breaths, practice coughing to be sure your lungs are clear. If you have an incision (the cut made at the time of surgery), support your incision when coughing by placing a pillow or rolled up towels firmly against it. Once you are able to get out of bed, walk around indoors and cough well. You may stop using the incentive spirometer when instructed by your caregiver.  RISKS AND COMPLICATIONS  Take your time  so you do not get dizzy or light-headed.  If you are in pain, you may need to take or ask for pain medication before doing incentive spirometry.  It is harder to take a deep breath if you are having pain. AFTER USE  Rest and breathe slowly and easily.  It can be helpful to keep track of a log of your progress. Your caregiver can provide you with a simple table to help with this. If you are using the spirometer at home, follow these instructions: [place] IF:   You are having difficultly using the spirometer.  You have trouble using the spirometer as often as instructed.  Your pain medication is not giving enough relief while using the spirometer.  You develop fever of 100.5 F (38.1 C) or higher. SEEK IMMEDIATE MEDICAL CARE IF:   You cough up bloody sputum that had not been present before.  You develop fever of 102 F (38.9 C) or greater.  You develop worsening pain at or near the incision site. MAKE SURE YOU:   Understand these instructions.  Will watch your condition.  Will get help right away if you are not doing well or get worse. Document Released: 04/23/2007 Document Revised: 03/04/2012 Document Reviewed: 06/24/2007 ExitCare Patient Information 2014 Exit[employer], [place].   ________________________________________________________________________  WHAT IS A BLOOD TRANSFUSION? Blood Transfusion Information  A transfusion is the replacement of blood or some of its parts. Blood is made up of multiple cells which provide different functions.  Red blood cells carry oxygen and are used for blood loss replacement.  White blood cells fight against infection.  Platelets control bleeding.  Plasma helps clot blood.  Other blood products are available for specialized needs, such as hemophilia or other clotting disorders. BEFORE THE TRANSFUSION  Who gives blood for transfusions?   Healthy volunteers who are fully evaluated to make sure their blood is safe. This is blood bank blood. Transfusion therapy is the safest it has ever been in the practice of medicine. Before blood is taken from a donor, a complete  history is taken to make sure that person has no history of diseases nor engages in risky social behavior (examples are intravenous drug use or sexual activity with multiple partners). The donor's travel history is screened to minimize risk of transmitting infections, such as malaria. The donated blood is tested for signs of infectious diseases, such as HIV and hepatitis. The blood is then tested to be sure it is compatible with you in order to minimize the chance of a transfusion reaction. If you or a relative donates blood, this is often done in anticipation of surgery and is not appropriate for emergency situations. It takes many days to process the donated blood. RISKS AND COMPLICATIONS Although transfusion therapy is very safe and saves many lives, the main dangers of transfusion include:   Getting an infectious disease.  Developing a transfusion reaction. This is an allergic reaction to something in the blood you were given. Every precaution is taken to prevent this. The decision to have a blood transfusion has been considered carefully by your caregiver before blood is given. Blood is not given unless the benefits outweigh the risks. AFTER THE TRANSFUSION  Right after receiving a blood transfusion, you will usually feel much better and more energetic. This is especially true if your red blood cells have gotten low (anemic). The transfusion raises the level of the red blood cells which carry oxygen, and this usually causes an energy increase.  The  nurse administering the transfusion will monitor you carefully for complications. HOME CARE INSTRUCTIONS  No special instructions are needed after a transfusion. You may find your energy is better. Speak with your caregiver about any limitations on activity for underlying diseases you may have. SEEK MEDICAL CARE IF:   Your condition is not improving after your transfusion.  You develop redness or irritation at the intravenous (IV) site. SEEK  IMMEDIATE MEDICAL CARE IF:  Any of the following symptoms occur over the next 12 hours:  Shaking chills.  You have a temperature by mouth above 102 F (38.9 C), not controlled by medicine.  Chest, back, or muscle pain.  People around you feel you are not acting correctly or are confused.  Shortness of breath or difficulty breathing.  Dizziness and fainting.  You get a rash or develop hives.  You have a decrease in urine output.  Your urine turns a dark color or changes to pink, red, or brown. Any of the following symptoms occur over the next 10 days:  You have a temperature by mouth above 102 F (38.9 C), not controlled by medicine.  Shortness of breath.  Weakness after normal activity.  The white part of the eye turns yellow (jaundice).  You have a decrease in the amount of urine or are urinating less often.  Your urine turns a dark color or changes to pink, red, or brown. Document Released: 12/08/2000 Document Revised: 03/04/2012 Document Reviewed: 07/27/2008 [employer] Patient Information 2014 [place], [place].  _______________________________________________________________________

## 2018-02-19 NOTE — Pre-Procedure Instructions (Signed)
The following are in epic: Last office visit Dr. Felecia Shelling 10/31/2017

## 2018-02-20 ENCOUNTER — Ambulatory Visit (HOSPITAL_COMMUNITY)
Admission: RE | Admit: 2018-02-20 | Discharge: 2018-02-20 | Disposition: A | Discharge status: Home / Self Care | Payer: Medicare Other | Source: Ambulatory Visit | Attending: Anesthesiology | Admitting: Anesthesiology

## 2018-02-20 ENCOUNTER — Encounter (HOSPITAL_COMMUNITY)
Admission: RE | Admit: 2018-02-20 | Discharge: 2018-02-20 | Disposition: A | Discharge status: Home / Self Care | Payer: Medicare Other | Source: Ambulatory Visit | Attending: Orthopedic Surgery | Admitting: Orthopedic Surgery

## 2018-02-20 ENCOUNTER — Other Ambulatory Visit: Payer: Self-pay

## 2018-02-20 ENCOUNTER — Encounter (HOSPITAL_COMMUNITY): Payer: Self-pay

## 2018-02-20 DIAGNOSIS — Z01818 Encounter for other preprocedural examination: Secondary | ICD-10-CM | POA: Diagnosis present

## 2018-02-20 DIAGNOSIS — M24661 Ankylosis, right knee: Secondary | ICD-10-CM | POA: Diagnosis not present

## 2018-02-20 DIAGNOSIS — Z01812 Encounter for preprocedural laboratory examination: Secondary | ICD-10-CM | POA: Insufficient documentation

## 2018-02-20 DIAGNOSIS — I493 Ventricular premature depolarization: Secondary | ICD-10-CM | POA: Diagnosis not present

## 2018-02-20 DIAGNOSIS — Z0183 Encounter for blood typing: Secondary | ICD-10-CM | POA: Insufficient documentation

## 2018-02-20 HISTORY — DX: Malignant (primary) neoplasm, unspecified: C80.1

## 2018-02-20 HISTORY — DX: Spinal stenosis, site unspecified: M48.00

## 2018-02-20 HISTORY — DX: REM sleep behavior disorder: G47.52

## 2018-02-20 HISTORY — DX: Personal history of urinary calculi: Z87.442

## 2018-02-20 HISTORY — DX: Dorsalgia, unspecified: M54.9

## 2018-02-20 HISTORY — DX: Scoliosis, unspecified: M41.9

## 2018-02-20 HISTORY — DX: Dyspnea, unspecified: R06.00

## 2018-02-20 HISTORY — DX: Other specified soft tissue disorders: M79.89

## 2018-02-20 HISTORY — DX: Tremor, unspecified: R25.1

## 2018-02-20 HISTORY — DX: Other fatigue: R53.83

## 2018-02-20 LAB — CBC
HEMATOCRIT: 37.3 % (ref 36.0–46.0)
HEMOGLOBIN: 12.4 g/dL (ref 12.0–15.0)
MCH: 29.5 pg (ref 26.0–34.0)
MCHC: 33.2 g/dL (ref 30.0–36.0)
MCV: 88.6 fL (ref 78.0–100.0)
Platelets: 205 10*3/uL (ref 150–400)
RBC: 4.21 MIL/uL (ref 3.87–5.11)
RDW: 12.8 % (ref 11.5–15.5)
WBC: 5.4 10*3/uL (ref 4.0–10.5)

## 2018-02-20 LAB — COMPREHENSIVE METABOLIC PANEL
ALT: 22 U/L (ref 14–54)
ANION GAP: 8 (ref 5–15)
AST: 22 U/L (ref 15–41)
Albumin: 3.9 g/dL (ref 3.5–5.0)
Alkaline Phosphatase: 111 U/L (ref 38–126)
BILIRUBIN TOTAL: 0.5 mg/dL (ref 0.3–1.2)
BUN: 25 mg/dL — ABNORMAL HIGH (ref 6–20)
CO2: 23 mmol/L (ref 22–32)
Calcium: 9.1 mg/dL (ref 8.9–10.3)
Chloride: 106 mmol/L (ref 101–111)
Creatinine, Ser: 1.05 mg/dL — ABNORMAL HIGH (ref 0.44–1.00)
GFR calc non Af Amer: 52 mL/min — ABNORMAL LOW (ref >60)
GLUCOSE: 98 mg/dL (ref 65–99)
Potassium: 4.3 mmol/L (ref 3.5–5.1)
Sodium: 137 mmol/L (ref 135–145)
TOTAL PROTEIN: 6.8 g/dL (ref 6.5–8.1)

## 2018-02-20 LAB — PROTIME-INR
INR: 1.02
Prothrombin Time: 13.3 seconds (ref 11.4–15.2)

## 2018-02-20 LAB — APTT: aPTT: 30 seconds (ref 24–36)

## 2018-02-22 NOTE — Pre-Procedure Instructions (Signed)
CMP results 02/20/18 faxed to Dr. Wynelle Link via epic.

## 2018-02-26 MED ORDER — BUPIVACAINE LIPOSOME 1.3 % IJ SUSP
20.0000 mL | Freq: Once | INTRAMUSCULAR | Status: DC
  Filled 2018-02-26: qty 20

## 2018-02-27 ENCOUNTER — Observation Stay (HOSPITAL_COMMUNITY)
Admission: RE | Admit: 2018-02-27 | Discharge: 2018-02-28 | Disposition: A | Discharge status: Home / Self Care | Payer: Medicare Other | Source: Ambulatory Visit | Attending: Orthopedic Surgery | Admitting: Orthopedic Surgery

## 2018-02-27 ENCOUNTER — Ambulatory Visit (HOSPITAL_COMMUNITY): Payer: Medicare Other | Admitting: Certified Registered Nurse Anesthetist

## 2018-02-27 ENCOUNTER — Other Ambulatory Visit: Payer: Self-pay

## 2018-02-27 ENCOUNTER — Encounter (HOSPITAL_COMMUNITY)
Admission: RE | Disposition: A | Discharge status: Home / Self Care | Payer: Self-pay | Source: Ambulatory Visit | Attending: Orthopedic Surgery

## 2018-02-27 ENCOUNTER — Encounter (HOSPITAL_COMMUNITY): Payer: Self-pay | Admitting: Certified Registered Nurse Anesthetist

## 2018-02-27 DIAGNOSIS — Z7982 Long term (current) use of aspirin: Secondary | ICD-10-CM | POA: Insufficient documentation

## 2018-02-27 DIAGNOSIS — M25561 Pain in right knee: Secondary | ICD-10-CM | POA: Diagnosis present

## 2018-02-27 DIAGNOSIS — Z85828 Personal history of other malignant neoplasm of skin: Secondary | ICD-10-CM | POA: Insufficient documentation

## 2018-02-27 DIAGNOSIS — Z79899 Other long term (current) drug therapy: Secondary | ICD-10-CM | POA: Diagnosis not present

## 2018-02-27 DIAGNOSIS — E7800 Pure hypercholesterolemia, unspecified: Secondary | ICD-10-CM | POA: Diagnosis not present

## 2018-02-27 DIAGNOSIS — Z96651 Presence of right artificial knee joint: Secondary | ICD-10-CM | POA: Insufficient documentation

## 2018-02-27 DIAGNOSIS — Z7951 Long term (current) use of inhaled steroids: Secondary | ICD-10-CM | POA: Diagnosis not present

## 2018-02-27 DIAGNOSIS — K219 Gastro-esophageal reflux disease without esophagitis: Secondary | ICD-10-CM | POA: Insufficient documentation

## 2018-02-27 DIAGNOSIS — I10 Essential (primary) hypertension: Secondary | ICD-10-CM | POA: Diagnosis not present

## 2018-02-27 DIAGNOSIS — M24661 Ankylosis, right knee: Principal | ICD-10-CM | POA: Insufficient documentation

## 2018-02-27 HISTORY — PX: KNEE ARTHROTOMY: SHX5881

## 2018-02-27 LAB — TYPE AND SCREEN
ABO/RH(D): O POS
Antibody Screen: NEGATIVE

## 2018-02-27 SURGERY — ARTHROTOMY, KNEE
Anesthesia: General | Site: Knee | Laterality: Right

## 2018-02-27 MED ORDER — CLONAZEPAM 0.5 MG PO TABS
0.5000 mg | ORAL_TABLET | Freq: Every day | ORAL | Status: DC
  Administered 2018-02-27: 21:00:00 0.5 mg via ORAL
  Filled 2018-02-27: qty 1

## 2018-02-27 MED ORDER — METHOCARBAMOL 500 MG PO TABS: 500.0000 mg | ORAL_TABLET | Freq: Four times a day (QID) | ORAL | Status: DC | PRN

## 2018-02-27 MED ORDER — OXCARBAZEPINE 300 MG PO TABS
300.0000 mg | ORAL_TABLET | Freq: Three times a day (TID) | ORAL | Status: DC
  Administered 2018-02-27 – 2018-02-28 (×3): 300 mg via ORAL
  Filled 2018-02-27 (×4): qty 1

## 2018-02-27 MED ORDER — IRBESARTAN 150 MG PO TABS
300.0000 mg | ORAL_TABLET | Freq: Every evening | ORAL | Status: DC
  Administered 2018-02-27: 300 mg via ORAL
  Filled 2018-02-27: qty 2

## 2018-02-27 MED ORDER — PROPOFOL 10 MG/ML IV BOLUS
INTRAVENOUS | Status: AC
  Filled 2018-02-27: qty 20

## 2018-02-27 MED ORDER — FENTANYL CITRATE (PF) 100 MCG/2ML IJ SOLN
INTRAMUSCULAR | Status: AC
  Filled 2018-02-27: qty 2

## 2018-02-27 MED ORDER — MONTELUKAST SODIUM 10 MG PO TABS
10.0000 mg | ORAL_TABLET | Freq: Every day | ORAL | Status: DC
  Administered 2018-02-27: 10 mg via ORAL
  Filled 2018-02-27: qty 1

## 2018-02-27 MED ORDER — FLUTICASONE PROPIONATE 50 MCG/ACT NA SUSP: 1.0000 | Freq: Every day | NASAL | Status: DC | PRN

## 2018-02-27 MED ORDER — CHLORHEXIDINE GLUCONATE 4 % EX LIQD: 60.0000 mL | Freq: Once | CUTANEOUS | Status: DC

## 2018-02-27 MED ORDER — DEXAMETHASONE SODIUM PHOSPHATE 10 MG/ML IJ SOLN
INTRAMUSCULAR | Status: DC | PRN
Start: 2018-02-27 — End: 2018-02-27
  Administered 2018-02-27: 10 mg via INTRAVENOUS

## 2018-02-27 MED ORDER — FENTANYL CITRATE (PF) 100 MCG/2ML IJ SOLN: 50.0000 ug | INTRAMUSCULAR | Status: DC

## 2018-02-27 MED ORDER — MIDAZOLAM HCL 2 MG/2ML IJ SOLN
INTRAMUSCULAR | Status: AC
  Administered 2018-02-27: 1 mg
  Filled 2018-02-27: qty 2

## 2018-02-27 MED ORDER — LORATADINE 10 MG PO TABS
10.0000 mg | ORAL_TABLET | Freq: Every day | ORAL | Status: DC
  Administered 2018-02-28: 10 mg via ORAL
  Filled 2018-02-27: qty 1

## 2018-02-27 MED ORDER — ACETAMINOPHEN 10 MG/ML IV SOLN
1000.0000 mg | Freq: Once | INTRAVENOUS | Status: AC
  Administered 2018-02-27: 1000 mg via INTRAVENOUS
  Filled 2018-02-27: qty 100

## 2018-02-27 MED ORDER — ENOXAPARIN SODIUM 40 MG/0.4ML ~~LOC~~ SOLN
40.0000 mg | SUBCUTANEOUS | Status: DC
  Administered 2018-02-28: 40 mg via SUBCUTANEOUS
  Filled 2018-02-27: qty 0.4

## 2018-02-27 MED ORDER — PROPOFOL 10 MG/ML IV BOLUS
INTRAVENOUS | Status: DC | PRN
  Administered 2018-02-27: 200 mg via INTRAVENOUS

## 2018-02-27 MED ORDER — FLUTICASONE FUROATE-VILANTEROL 100-25 MCG/INH IN AEPB
1.0000 | INHALATION_SPRAY | Freq: Every day | RESPIRATORY_TRACT | Status: DC
  Administered 2018-02-28: 1 via RESPIRATORY_TRACT
  Filled 2018-02-27: qty 28

## 2018-02-27 MED ORDER — IMIPRAMINE HCL 25 MG PO TABS
25.0000 mg | ORAL_TABLET | Freq: Every day | ORAL | Status: DC
  Administered 2018-02-27: 21:00:00 25 mg via ORAL
  Filled 2018-02-27: qty 1

## 2018-02-27 MED ORDER — SODIUM CHLORIDE 0.9 % IR SOLN
Status: DC | PRN
  Administered 2018-02-27: 1000 mL

## 2018-02-27 MED ORDER — GABAPENTIN 300 MG PO CAPS
300.0000 mg | ORAL_CAPSULE | Freq: Once | ORAL | Status: AC
  Administered 2018-02-27: 300 mg via ORAL
  Filled 2018-02-27: qty 1

## 2018-02-27 MED ORDER — PROPOFOL 10 MG/ML IV BOLUS
INTRAVENOUS | Status: AC
  Filled 2018-02-27: qty 60

## 2018-02-27 MED ORDER — ONDANSETRON HCL 4 MG PO TABS: 4.0000 mg | ORAL_TABLET | Freq: Four times a day (QID) | ORAL | Status: DC | PRN

## 2018-02-27 MED ORDER — CEFAZOLIN SODIUM-DEXTROSE 2-4 GM/100ML-% IV SOLN
2.0000 g | INTRAVENOUS | Status: AC
  Administered 2018-02-27: 2 g via INTRAVENOUS
  Filled 2018-02-27: qty 100

## 2018-02-27 MED ORDER — PRAVASTATIN SODIUM 40 MG PO TABS: 40.0000 mg | ORAL_TABLET | Freq: Every day | ORAL | Status: DC

## 2018-02-27 MED ORDER — OXYCODONE HCL 5 MG PO TABS
5.0000 mg | ORAL_TABLET | ORAL | Status: DC | PRN
  Administered 2018-02-27 – 2018-02-28 (×2): 5 mg via ORAL
  Filled 2018-02-27 (×2): qty 1

## 2018-02-27 MED ORDER — FENTANYL CITRATE (PF) 100 MCG/2ML IJ SOLN
INTRAMUSCULAR | Status: AC
  Administered 2018-02-27 (×2): 50 ug
  Filled 2018-02-27: qty 2

## 2018-02-27 MED ORDER — AMLODIPINE-OLMESARTAN 5-40 MG PO TABS: 1.0000 | ORAL_TABLET | Freq: Every evening | ORAL | Status: DC

## 2018-02-27 MED ORDER — LAMOTRIGINE 25 MG PO TABS
50.0000 mg | ORAL_TABLET | Freq: Four times a day (QID) | ORAL | Status: DC
  Administered 2018-02-27 – 2018-02-28 (×4): 50 mg via ORAL
  Filled 2018-02-27 (×4): qty 2

## 2018-02-27 MED ORDER — TRAMADOL HCL 50 MG PO TABS: 50.0000 mg | ORAL_TABLET | Freq: Four times a day (QID) | ORAL | Status: DC | PRN

## 2018-02-27 MED ORDER — DEXAMETHASONE SODIUM PHOSPHATE 10 MG/ML IJ SOLN
10.0000 mg | Freq: Once | INTRAMUSCULAR | Status: AC
  Administered 2018-02-27: 10 mg via INTRAVENOUS
  Filled 2018-02-27: qty 1

## 2018-02-27 MED ORDER — METHOCARBAMOL 1000 MG/10ML IJ SOLN
500.0000 mg | Freq: Four times a day (QID) | INTRAVENOUS | Status: DC | PRN
  Administered 2018-02-27: 500 mg via INTRAVENOUS
  Filled 2018-02-27: qty 550

## 2018-02-27 MED ORDER — SODIUM CHLORIDE 0.9 % IV SOLN: INTRAVENOUS | Status: DC

## 2018-02-27 MED ORDER — ACETAMINOPHEN 500 MG PO TABS
1000.0000 mg | ORAL_TABLET | Freq: Four times a day (QID) | ORAL | Status: DC
  Administered 2018-02-27 – 2018-02-28 (×3): 1000 mg via ORAL
  Filled 2018-02-27 (×5): qty 2

## 2018-02-27 MED ORDER — SCOPOLAMINE 1 MG/3DAYS TD PT72
MEDICATED_PATCH | TRANSDERMAL | Status: DC | PRN
  Administered 2018-02-27: 1 via TRANSDERMAL

## 2018-02-27 MED ORDER — ROPIVACAINE HCL 7.5 MG/ML IJ SOLN
INTRAMUSCULAR | Status: DC | PRN
  Administered 2018-02-27: 20 mL via PERINEURAL

## 2018-02-27 MED ORDER — FENTANYL CITRATE (PF) 100 MCG/2ML IJ SOLN
25.0000 ug | INTRAMUSCULAR | Status: DC | PRN
  Administered 2018-02-27 (×2): 50 ug via INTRAVENOUS

## 2018-02-27 MED ORDER — KETOROLAC TROMETHAMINE 30 MG/ML IJ SOLN: 15.0000 mg | Freq: Once | INTRAMUSCULAR | Status: DC | PRN

## 2018-02-27 MED ORDER — ONDANSETRON HCL 4 MG/2ML IJ SOLN: 4.0000 mg | Freq: Four times a day (QID) | INTRAMUSCULAR | Status: DC | PRN

## 2018-02-27 MED ORDER — MIDAZOLAM HCL 2 MG/2ML IJ SOLN: 1.0000 mg | INTRAMUSCULAR | Status: DC

## 2018-02-27 MED ORDER — SCOPOLAMINE 1 MG/3DAYS TD PT72
MEDICATED_PATCH | TRANSDERMAL | Status: AC
  Filled 2018-02-27: qty 1

## 2018-02-27 MED ORDER — LIDOCAINE 2% (20 MG/ML) 5 ML SYRINGE
INTRAMUSCULAR | Status: DC | PRN
  Administered 2018-02-27: 80 mg via INTRAVENOUS

## 2018-02-27 MED ORDER — CEFAZOLIN SODIUM-DEXTROSE 2-4 GM/100ML-% IV SOLN
2.0000 g | Freq: Four times a day (QID) | INTRAVENOUS | Status: AC
  Administered 2018-02-27 (×2): 2 g via INTRAVENOUS
  Filled 2018-02-27 (×2): qty 100

## 2018-02-27 MED ORDER — ONDANSETRON HCL 4 MG/2ML IJ SOLN
INTRAMUSCULAR | Status: DC | PRN
  Administered 2018-02-27: 4 mg via INTRAVENOUS

## 2018-02-27 MED ORDER — HYDROMORPHONE HCL 1 MG/ML IJ SOLN: 0.5000 mg | INTRAMUSCULAR | Status: DC | PRN

## 2018-02-27 MED ORDER — PANTOPRAZOLE SODIUM 40 MG PO TBEC: 40.0000 mg | DELAYED_RELEASE_TABLET | Freq: Every day | ORAL | Status: DC

## 2018-02-27 MED ORDER — DOCUSATE SODIUM 100 MG PO CAPS
100.0000 mg | ORAL_CAPSULE | Freq: Two times a day (BID) | ORAL | Status: DC
  Administered 2018-02-27 – 2018-02-28 (×2): 100 mg via ORAL
  Filled 2018-02-27 (×2): qty 1

## 2018-02-27 MED ORDER — METOCLOPRAMIDE HCL 5 MG/ML IJ SOLN: 5.0000 mg | Freq: Three times a day (TID) | INTRAMUSCULAR | Status: DC | PRN

## 2018-02-27 MED ORDER — METOCLOPRAMIDE HCL 5 MG PO TABS: 5.0000 mg | ORAL_TABLET | Freq: Three times a day (TID) | ORAL | Status: DC | PRN

## 2018-02-27 MED ORDER — AMLODIPINE BESYLATE 5 MG PO TABS
5.0000 mg | ORAL_TABLET | Freq: Every evening | ORAL | Status: DC
Start: 2018-02-27 — End: 2018-02-28
  Administered 2018-02-27: 5 mg via ORAL
  Filled 2018-02-27: qty 1

## 2018-02-27 MED ORDER — PROMETHAZINE HCL 25 MG/ML IJ SOLN: 6.2500 mg | INTRAMUSCULAR | Status: DC | PRN

## 2018-02-27 MED ORDER — LACTATED RINGERS IV SOLN
INTRAVENOUS | Status: DC
  Administered 2018-02-27: 08:00:00 via INTRAVENOUS
  Administered 2018-02-27: 1000 mL via INTRAVENOUS
  Administered 2018-02-27: 07:00:00 via INTRAVENOUS

## 2018-02-27 MED ORDER — OXYCODONE HCL 5 MG PO TABS: 10.0000 mg | ORAL_TABLET | ORAL | Status: DC | PRN

## 2018-02-27 MED ORDER — OMEPRAZOLE 20 MG PO CPDR
20.0000 mg | DELAYED_RELEASE_CAPSULE | Freq: Every day | ORAL | Status: DC
  Administered 2018-02-28: 20 mg via ORAL
  Filled 2018-02-27: qty 1

## 2018-02-27 SURGICAL SUPPLY — 37 items
BAG SPEC THK2 15X12 ZIP CLS (MISCELLANEOUS) ×1
BAG ZIPLOCK 12X15 (MISCELLANEOUS) ×3 IMPLANT
BANDAGE ACE 6X5 VEL STRL LF (GAUZE/BANDAGES/DRESSINGS) ×3 IMPLANT
BANDAGE ESMARK 6X9 LF (GAUZE/BANDAGES/DRESSINGS) ×1 IMPLANT
BNDG CMPR 9X6 STRL LF SNTH (GAUZE/BANDAGES/DRESSINGS) ×1
BNDG ESMARK 6X9 LF (GAUZE/BANDAGES/DRESSINGS) ×3
CLOSURE WOUND 1/2 X4 (GAUZE/BANDAGES/DRESSINGS) ×1
COVER SURGICAL LIGHT HANDLE (MISCELLANEOUS) ×3 IMPLANT
CUFF TOURN SGL QUICK 34 (TOURNIQUET CUFF)
CUFF TRNQT CYL 34X4X40X1 (TOURNIQUET CUFF) IMPLANT
DRAPE EXTREMITY T 121X128X90 (DRAPE) ×3 IMPLANT
DRAPE U-SHAPE 47X51 STRL (DRAPES) ×3 IMPLANT
DRSG ADAPTIC 3X8 NADH LF (GAUZE/BANDAGES/DRESSINGS) ×3 IMPLANT
DRSG PAD ABDOMINAL 8X10 ST (GAUZE/BANDAGES/DRESSINGS) ×6 IMPLANT
DURAPREP 26ML APPLICATOR (WOUND CARE) ×3 IMPLANT
ELECT REM PT RETURN 15FT ADLT (MISCELLANEOUS) ×3 IMPLANT
GAUZE SPONGE 4X4 12PLY STRL (GAUZE/BANDAGES/DRESSINGS) ×3 IMPLANT
GLOVE BIO SURGEON STRL SZ7.5 (GLOVE) ×3 IMPLANT
GLOVE BIO SURGEON STRL SZ8 (GLOVE) ×3 IMPLANT
GLOVE BIOGEL PI IND STRL 8 (GLOVE) ×3 IMPLANT
GLOVE BIOGEL PI INDICATOR 8 (GLOVE) ×6
GOWN STRL REUS W/TWL LRG LVL3 (GOWN DISPOSABLE) ×5 IMPLANT
GOWN STRL REUS W/TWL XL LVL3 (GOWN DISPOSABLE) ×3 IMPLANT
IMMOBILIZER KNEE 20 (SOFTGOODS) ×3
IMMOBILIZER KNEE 20 THIGH 36 (SOFTGOODS) IMPLANT
KIT BASIN OR (CUSTOM PROCEDURE TRAY) ×3 IMPLANT
MANIFOLD NEPTUNE II (INSTRUMENTS) ×3 IMPLANT
PACK TOTAL JOINT (CUSTOM PROCEDURE TRAY) ×3 IMPLANT
PADDING CAST COTTON 6X4 STRL (CAST SUPPLIES) ×4 IMPLANT
POSITIONER SURGICAL ARM (MISCELLANEOUS) ×3 IMPLANT
STRIP CLOSURE SKIN 1/2X4 (GAUZE/BANDAGES/DRESSINGS) ×3 IMPLANT
SUT MNCRL AB 4-0 PS2 18 (SUTURE) ×3 IMPLANT
SUT VIC AB 2-0 CT1 27 (SUTURE) ×6
SUT VIC AB 2-0 CT1 TAPERPNT 27 (SUTURE) ×2 IMPLANT
SUT VLOC 180 0 24IN GS25 (SUTURE) ×3 IMPLANT
TOWEL OR 17X26 10 PK STRL BLUE (TOWEL DISPOSABLE) ×6 IMPLANT
WRAP KNEE MAXI GEL POST OP (GAUZE/BANDAGES/DRESSINGS) ×2 IMPLANT

## 2018-02-27 NOTE — Anesthesia Postprocedure Evaluation (Signed)
Anesthesia Post Note  Patient: Kristen Hansen  Procedure(s) Performed: RIGHT KNEE ARTHROTOMY; scar excision (Right Knee)     Patient location during evaluation: PACU Anesthesia Type: General Level of consciousness: awake and alert Pain management: pain level controlled Vital Signs Assessment: post-procedure vital signs reviewed and stable Respiratory status: spontaneous breathing, nonlabored ventilation, respiratory function stable and patient connected to nasal cannula oxygen Cardiovascular status: blood pressure returned to baseline and stable Postop Assessment: no apparent nausea or vomiting Anesthetic complications: no    Last Vitals:  Vitals:   02/27/18 1030 02/27/18 1045  BP: (!) 167/89 (!) 153/84  Pulse: 84 81  Resp: 12 14  Temp:    SpO2: 100% 100%    Last Pain:  Vitals:   02/27/18 1100  TempSrc:   PainSc: Asleep                 , S

## 2018-02-27 NOTE — Anesthesia Procedure Notes (Signed)
Anesthesia Regional Block: Adductor canal block   Pre-Anesthetic Checklist: ,, timeout performed, Correct Patient, Correct Site, Correct Laterality, Correct Procedure, Correct Position, site marked, Risks and benefits discussed,  Surgical consent,  Pre-op evaluation,  At surgeon's request and post-op pain management  Laterality: Right  Prep: chloraprep       Needles:  Injection technique: Single-shot  Needle Type: Echogenic Needle     Needle Length: 9cm      Additional Needles:   Procedures:,,,, ultrasound used (permanent image in chart),,,,  Narrative:  Start time: 02/27/2018 8:00 AM End time: 02/27/2018 8:10 AM Injection made incrementally with aspirations every 5 mL.  Performed by: Personally  Anesthesiologist: Myrtie Soman, MD  Additional Notes: Patient tolerated the procedure well without complications

## 2018-02-27 NOTE — Transfer of Care (Signed)
Immediate Anesthesia Transfer of Care Note  Patient: Kristen Hansen  Procedure(s) Performed: RIGHT KNEE ARTHROTOMY; scar excision (Right Knee)  Patient Location: PACU  Anesthesia Type:General  Level of Consciousness: awake and drowsy  Airway & Oxygen Therapy: Patient Spontanous Breathing and Patient connected to face mask oxygen  Post-op Assessment: Report given to RN and Post -op Vital signs reviewed and stable  Post vital signs: Reviewed and stable  Last Vitals:  Vitals:   02/27/18 0819 02/27/18 0820  BP:    Pulse: 94   Resp: 13 16  Temp:    SpO2: 100%     Last Pain:  Vitals:   02/27/18 0651  TempSrc: Oral  PainSc: 0-No pain         Complications: No apparent anesthesia complications

## 2018-02-27 NOTE — Anesthesia Procedure Notes (Signed)
Anesthesia Procedure Image    

## 2018-02-27 NOTE — Anesthesia Procedure Notes (Signed)
Procedure Name: LMA Insertion Date/Time: 02/27/2018 8:30 AM Performed by: [place]  C, CRNA Pre-anesthesia Checklist: Patient identified, Emergency Drugs available, Suction available and Patient being monitored Patient Re-evaluated:Patient Re-evaluated prior to induction Oxygen Delivery Method: Circle system utilized Preoxygenation: Pre-oxygenation with 100% oxygen Induction Type: IV induction Ventilation: Mask ventilation without difficulty LMA: LMA inserted LMA Size: 4.0 Number of attempts: 1 Airway Equipment and Method: Bite block Placement Confirmation: positive ETCO2 Tube secured with: Tape Dental Injury: Teeth and Oropharynx as per pre-operative assessment

## 2018-02-27 NOTE — H&P (Signed)
CC- Kristen Hansen is a 72 y.o. female who presents with right knee stiffness.  HPI- . Knee Pain: Patient presents with stiffness involving the  right knee. Onset of the symptoms was several months ago. Inciting event: She had a Total Knee Arthroplasty in 2014 and was doing well until recently when she had progressive loss of knee flexion due to scar tissue. SHe did not improve with physical therapy. SHe has limited motion which is altering her gait pattern and causing hip and knee pain. Work up was negative for any periprosthetic loosening. She has failed non-operative management and presents for arthrotomy and scar excision. Current symptoms include stiffness.   Past Medical History:  Diagnosis Date  . Back pain   . Cancer (HCC)    Skin, basal cell  . Dyspnea   . Fatigue   . GERD (gastroesophageal reflux disease)   . Headache(784.0)    history of migraines  . History of kidney stones   . Hypercholesteremia   . Hypertension   . Leg swelling    Bilateral  . Occasional tremors    mild hand tremors  . Osteoarthritis   . PONV (postoperative nausea and vomiting)   . RBD (REM behavioral disorder)   . Rheumatoid arthritis(714.0)   . Scoliosis of lumbar spine   . Spinal stenosis    Multiple levels  . 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 Trigeminal neuralgia 03/30/2015  . Vision abnormalities     Past Surgical History:  Procedure Laterality Date  . ABDOMINAL HYSTERECTOMY    . AUGMENTATION MAMMAPLASTY Bilateral   . COLONOSCOPY    . JOINT REPLACEMENT     right uni knee, right hip with 2 revisions  . KIDNEY STONE SURGERY Bilateral   . MOHS SURGERY    . TONSILLECTOMY    . TOTAL HIP ARTHROPLASTY    . TOTAL KNEE ARTHROPLASTY  01/22/2013   Procedure: TOTAL KNEE ARTHROPLASTY;  Surgeon: Gearlean Alf, MD;  Location: [facility];  Service: Orthopedics;  Laterality: Right;  Revision of a Right Uni Knee to a Total Knee Arthroplasty    Prior to Admission medications   Medication Sig Start Date End Date Taking?  Authorizing Provider  amLODipine-olmesartan (AZOR) 5-40 MG per tablet Take 1 tablet by mouth every evening.    Yes [provider]  aspirin EC 81 MG tablet Take 81 mg by mouth daily.   Yes [provider]  Bromelains (BROMELAIN PO) Take 500 mg by mouth daily.   Yes [provider]  Calcium Carbonate-Vitamin D (CALTRATE 600+D PO) Take 1 tablet by mouth daily.   Yes [provider]  clonazePAM (KLONOPIN) 0.5 MG tablet TAKE 1 TABLET BY MOUTH AT BEDTIME 11/26/17  Yes Sater, Nanine Means, MD  Coenzyme Q10 (CO Q 10) 100 MG CAPS Take 100 mg by mouth daily.    Yes [provider]  Docusate Calcium (STOOL SOFTENER PO) Take 1 tablet by mouth daily.   Yes [provider]  etanercept (ENBREL SURECLICK) 50 MG/ML injection INJECT 1ML (50MG ) UNDER THE SKIN ONCE PER WEEK ON SUNDAYS 09/06/15  Yes [provider]  etodolac (LODINE) 400 MG tablet TAKE 1 TABLET BY MOUTH TWICE DAILY Patient taking differently: TAKE 1 TABLET BY MOUTH TWICE DAILY AS NEEDED FOR PAIN 02/11/18  Yes Sater, Nanine Means, MD  fluticasone furoate-vilanterol (BREO ELLIPTA) 100-25 MCG/INH AEPB Inhale 1 puff into the lungs daily.   Yes [provider]  imipramine (TOFRANIL) 25 MG tablet TAKE ONE TABLET BY MOUTH AT  BEDTIME 11/27/17  Yes Sater, Nanine Means, MD  lamoTRIgine (LAMICTAL) 100 MG tablet TAKE ONE TABLET BY MOUTH TWICE DAILY Patient taking differently: TAKE 50 MG 4 TIMES DAILY 12/04/17  Yes Sater, Nanine Means, MD  loratadine (CLARITIN) 10 MG tablet Take 10 mg by mouth daily.   Yes [provider]  mometasone (NASONEX) 50 MCG/ACT nasal spray Place 1 spray into the nose daily as needed (CONGESTION).    Yes [provider]  montelukast (SINGULAIR) 10 MG tablet Take 10 mg by mouth at bedtime.   Yes [provider]  omeprazole (PRILOSEC) 20 MG capsule Take 20 mg by mouth daily.    Yes [provider]  Oxcarbazepine (TRILEPTAL) 300 MG tablet TAKE ONE  TABLET BY MOUTH IN THE MORNING, 1 AT NOON AND 2 AT BEDTIME Patient taking differently: TAKE ONE TABLET BY MOUTH IN THE MORNING 1 AT NOON AND 1 TABLET AT BEDTIME 05/28/17  Yes Sater, Nanine Means, MD  Polyvinyl Alcohol-Povidone (REFRESH OP) Apply 1 drop to eye daily as needed (DRY EYES).   Yes [provider]  pravastatin (PRAVACHOL) 40 MG tablet Take 40 mg by mouth daily before breakfast.    Yes [provider]  Turmeric 500 MG CAPS Take 1,000 mg by mouth daily.   Yes [provider]  valACYclovir (VALTREX) 500 MG tablet TAKE 2 TABLETS BY MOUTH EVERY 12 HOURS AS NEEDED FOR FEVER BLISTERS   Yes [provider]  Vitamin D, Ergocalciferol, (DRISDOL) 50000 units CAPS capsule Take 50,000 Units by mouth every 7 (seven) days. SUNDAYS   Yes [provider]  hydroxychloroquine (PLAQUENIL) 200 MG tablet Take 200 mg by mouth daily.    [provider]  methylPREDNISolone (MEDROL DOSEPAK) 4 MG TBPK tablet Take as directed over 6 days Patient not taking: Reported on 02/12/2018 04/30/17   Sater, Nanine Means, MD  traMADol (ULTRAM) 50 MG tablet Take 1 tablet (50 mg total) every 8 (eight) hours as needed by mouth. Patient not taking: Reported on 02/12/2018 10/31/17   Britt Bottom, MD   KNEE EXAM antalgic gait,no warmth or effusion, reduced range of motion (5-85 degrees), collateral ligaments intact  Physical Examination: General appearance - alert, well appearing, and in no distress Mental status - alert, oriented to person, place, and time Chest - clear to auscultation, no wheezes, rales or rhonchi, symmetric air entry Heart - normal rate, regular rhythm, normal S1, S2, no murmurs, rubs, clicks or gallops Abdomen - soft, nontender, nondistended, no masses or organomegaly Neurological - alert, oriented, normal speech, no focal findings or movement disorder noted   Asessment/Plan--- Right knee arthrofibrosis- - Plan right knee arthrotomy with scar excisiont.  Procedure risks and potential comps discussed with patient who elects to proceed. Goals are decreased pain and increased function with a high likelihood of achieving both

## 2018-02-27 NOTE — Progress Notes (Signed)
Assisted Dr. Rose with right, ultrasound guided, adductor canal block. Side rails up, monitors on throughout procedure. See vital signs in flow sheet. Tolerated Procedure well.  

## 2018-02-27 NOTE — Op Note (Signed)
NAME:  Kristen Hansen, Kristen Hansen                 ACCOUNT NO.:  [id number]  MEDICAL RECORD NO.:  [id number]  LOCATION:                                 FACILITY:  PHYSICIAN:  Gaynelle Arabian, M.D.         DATE OF BIRTH:  DATE OF PROCEDURE:  02/27/2018 DATE OF DISCHARGE:                              OPERATIVE REPORT   PREOPERATIVE DIAGNOSIS:  Arthrofibrosis, right knee.  POSTOPERATIVE DIAGNOSIS:  Arthrofibrosis, right knee.  PROCEDURE:  Right knee arthrotomy with scar excision.  SURGEON:  Gaynelle Arabian, M.D.  ASSISTANT:  Alexzandrew L. Perkins, PA-C.  ANESTHESIA:  General plus adductor canal block.  ESTIMATED BLOOD LOSS:  10 cc.  DRAINS:  Hemovac x1.  COMPLICATIONS:  None.  CONDITION:  Stable to recovery.  TOURNIQUET TIME:  Approximately 13 minutes at 300 mmHg.  BRIEF CLINICAL NOTE:  Kristen Hansen is a 72 year old female had a total knee arthroplasty done approximately 5 years ago.  She had done well until recently when she started to lose range of motion.  She did not respond to physical therapy and other nonoperative modalities.  She presents now for arthrotomy and scar excision secondary to arthrofibrosis.  PROCEDURE IN DETAIL:  After successful administration of adductor canal block and then general anesthetic, a tourniquet was placed high on her right thigh, and right lower extremity prepped and draped in the sterile fashion.  Exam under anesthesia showed range of motion of 5-85 degrees. She had a firm endpoint.  The extremities wrapped in Esmarch and the tourniquet inflated to 300 mmHg.  Previous incision was reutilized, and skin cut with a 10-blade through the subcutaneous tissue to the level of the extensor mechanism.  A fresh blade was used to make a medial arthrotomy.  We did not encounter any fluid in the joint.  There was abundant scar tissue present under the extensor mechanism.  This was all removed with rongeur and electrocautery.  Once we cleared out the medial and lateral  gutters and suprapatellar area, I flexed her knee, was able to get her back to about 120 degrees.  I then copiously irrigated the joint with saline solution.  All the components were well fixed and were in excellent position.  We closed the knee in 100 degrees of flexion with a running #1 Stratafix suture.  I was able to flex her back to 115 degrees after closure.  The tourniquet was then released for a tourniquet time of 13 minutes.  A drain was also placed subcutaneously. The subcu tissue was then closed with interrupted 2-0 Vicryl and subcuticular running 4-0 Monocryl.  The incision was cleaned and dried, and Steri-Strips and a bulky sterile dressing were applied.  She was then awakened and transported to recovery in stable condition.  Please note that a surgical assistant is a medical necessity for this procedure.     Gaynelle Arabian, M.D.     FA/MEDQ  D:  02/27/2018  T:  02/27/2018  Job:  [id number]

## 2018-02-27 NOTE — Brief Op Note (Signed)
02/27/2018  9:21 AM  PATIENT:  Kristen Hansen  72 y.o. female  PRE-OPERATIVE DIAGNOSIS:  Arthrofibrosis right knee  POST-OPERATIVE DIAGNOSIS:  Arthrofibrosis right knee  PROCEDURE:  Procedure(s): RIGHT KNEE ARTHROTOMY; scar excision (Right)  SURGEON:  Surgeon(s) and Role:    Gaynelle Arabian, MD - Primary  PHYSICIAN ASSISTANT:   ASSISTANTS: Arlee Muslim, PA-C   ANESTHESIA:   general and Adduction canal block  EBL:  25 ml  BLOOD ADMINISTERED:none  DRAINS: (Medium) Hemovact drain(s) in the right knee with  Suction Open   LOCAL MEDICATIONS USED:  NONE  COUNTS:  YES  TOURNIQUET:   Total Tourniquet Time Documented: Thigh (Right) - 13 minutes Total: Thigh (Right) - 13 minutes   DICTATION: .Other Dictation: Dictation Number [id number]  PLAN OF CARE: Admit for overnight observation  PATIENT DISPOSITION:  PACU - hemodynamically stable.

## 2018-02-27 NOTE — Anesthesia Preprocedure Evaluation (Signed)
Anesthesia Evaluation  Patient identified by MRN, date of birth, ID band Patient awake    Reviewed: Allergy & Precautions, NPO status , Patient's Chart, lab work & pertinent test results  History of Anesthesia Complications (+) PONV  Airway Mallampati: II  TM Distance: >3 FB Neck ROM: Full    Dental no notable dental hx.    Pulmonary neg pulmonary ROS,    Pulmonary exam normal breath sounds clear to auscultation       Cardiovascular hypertension, Normal cardiovascular exam Rhythm:Regular Rate:Normal     Neuro/Psych negative neurological ROS  negative psych ROS   GI/Hepatic negative GI ROS, Neg liver ROS,   Endo/Other  negative endocrine ROS  Renal/GU negative Renal ROS  negative genitourinary   Musculoskeletal  (+) Arthritis , Rheumatoid disorders,    Abdominal   Peds negative pediatric ROS (+)  Hematology negative hematology ROS (+)   Anesthesia Other Findings   Reproductive/Obstetrics negative OB ROS                             Anesthesia Physical Anesthesia Plan  ASA: II  Anesthesia Plan: General   Post-op Pain Management:    Induction: Intravenous  PONV Risk Score and Plan: 3 and Ondansetron, Dexamethasone and Treatment may vary due to age or medical condition  Airway Management Planned: LMA  Additional Equipment:   Intra-op Plan:   Post-operative Plan: Extubation in OR  Informed Consent: I have reviewed the patients History and Physical, chart, labs and discussed the procedure including the risks, benefits and alternatives for the proposed anesthesia with the patient or authorized representative who has indicated his/her understanding and acceptance.   Dental advisory given  Plan Discussed with: CRNA and Surgeon  Anesthesia Plan Comments:         Anesthesia Quick Evaluation

## 2018-02-28 DIAGNOSIS — M24661 Ankylosis, right knee: Secondary | ICD-10-CM | POA: Diagnosis not present

## 2018-02-28 MED ORDER — OXYCODONE HCL 5 MG PO TABS: 5.0000 mg | ORAL_TABLET | ORAL | 0 refills | Status: DC | PRN

## 2018-02-28 MED ORDER — METHOCARBAMOL 500 MG PO TABS: 500.0000 mg | ORAL_TABLET | Freq: Four times a day (QID) | ORAL | 0 refills | Status: DC | PRN

## 2018-02-28 MED ORDER — ASPIRIN EC 325 MG PO TBEC: 325.0000 mg | DELAYED_RELEASE_TABLET | Freq: Every day | ORAL | 0 refills | Status: DC

## 2018-02-28 NOTE — Discharge Instructions (Signed)
Dr. Gaynelle Arabian Total Joint Specialist [facility] [address], [place], [place] [address] [phone]  TOTAL KNEE POSTOPERATIVE DIRECTIONS  Knee Rehabilitation, Guidelines Following Surgery  Results after knee surgery are often greatly improved when you follow the exercise, range of motion and muscle strengthening exercises prescribed by your doctor. Safety measures are also important to protect the knee from further injury. Any time any of these exercises cause you to have increased pain or swelling in your knee joint, decrease the amount until you are comfortable again and slowly increase them. If you have problems or questions, call your caregiver or physical therapist for advice.   HOME CARE INSTRUCTIONS  Remove items at home which could result in a fall. This includes throw rugs or furniture in walking pathways.   ICE to the affected knee every three hours for 30 minutes at a time and then as needed for pain and swelling.  Continue to use ice on the knee for pain and swelling from surgery. You may notice swelling that will progress down to the foot and ankle.  This is normal after surgery.  Elevate the leg when you are not up walking on it.    Continue to use the breathing machine which will help keep your temperature down.  It is common for your temperature to cycle up and down following surgery, especially at night when you are not up moving around and exerting yourself.  The breathing machine keeps your lungs expanded and your temperature down.  Do not place pillow under knee, focus on keeping the knee straight while resting  DIET You may resume your previous home diet once your are discharged from the hospital.  DRESSING / WOUND CARE / SHOWERING You may shower 3 days after surgery, but keep the wounds dry during showering.  You may use an occlusive plastic wrap (Press'n Seal for example), NO SOAKING/SUBMERGING IN THE BATHTUB.  If the bandage gets wet, change  with a clean dry gauze.  If the incision gets wet, pat the wound dry with a clean towel. You may start showering once you are discharged home but do not submerge the incision under water. Just pat the incision dry and apply a dry gauze dressing on daily. Change the surgical dressing daily and reapply a dry dressing each time.  ACTIVITY Walk with your walker as instructed. Use walker as long as suggested by your caregivers. Avoid periods of inactivity such as sitting longer than an hour when not asleep. This helps prevent blood clots.  You may resume a sexual relationship in one month or when given the OK by your doctor.  You may return to work once you are cleared by your doctor.  Do not drive a car for 6 weeks or until released by you surgeon.  Do not drive while taking narcotics.  WEIGHT BEARING Weight bearing as tolerated with assist device (walker, cane, etc) as directed, use it as long as suggested by your surgeon or therapist, typically at least 4-6 weeks.  POSTOPERATIVE CONSTIPATION PROTOCOL Constipation - defined medically as fewer than three stools per week and severe constipation as less than one stool per week.  One of the most common issues patients have following surgery is constipation.  Even if you have a regular bowel pattern at home, your normal regimen is likely to be disrupted due to multiple reasons following surgery.  Combination of anesthesia, postoperative narcotics, change in appetite and fluid intake all can affect your bowels.  In  order to avoid complications following surgery, here are some recommendations in order to help you during your recovery period.  Colace (docusate) - Pick up an over-the-counter form of Colace or another stool softener and take twice a day as long as you are requiring postoperative pain medications.  Take with a full glass of water daily.  If you experience loose stools or diarrhea, hold the colace until you stool forms back up.  If your  symptoms do not get better within 1 week or if they get worse, check with your doctor.  Dulcolax (bisacodyl) - Pick up over-the-counter and take as directed by the product packaging as needed to assist with the movement of your bowels.  Take with a full glass of water.  Use this product as needed if not relieved by Colace only.   MiraLax (polyethylene glycol) - Pick up over-the-counter to have on hand.  MiraLax is a solution that will increase the amount of water in your bowels to assist with bowel movements.  Take as directed and can mix with a glass of water, juice, soda, coffee, or tea.  Take if you go more than two days without a movement. Do not use MiraLax more than once per day. Call your doctor if you are still constipated or irregular after using this medication for 7 days in a row.  If you continue to have problems with postoperative constipation, please contact the office for further assistance and recommendations.  If you experience "the worst abdominal pain ever" or develop nausea or vomiting, please contact the office immediatly for further recommendations for treatment.  ITCHING  If you experience itching with your medications, try taking only a single pain pill, or even half a pain pill at a time.  You can also use Benadryl over the counter for itching or also to help with sleep.   TED HOSE STOCKINGS Wear the elastic stockings on both legs for three weeks following surgery during the day but you may remove then at night for sleeping.  MEDICATIONS See your medication summary on the After Visit Summary that the nursing staff will review with you prior to discharge.  You may have some home medications which will be placed on hold until you complete the course of blood thinner medication.  It is important for you to complete the blood thinner medication as prescribed by your surgeon.  Continue your approved medications as instructed at time of discharge.  PRECAUTIONS If you  experience chest pain or shortness of breath - call 911 immediately for transfer to the hospital emergency department.  If you develop a fever greater that 101 F, purulent drainage from wound, increased redness or drainage from wound, foul odor from the wound/dressing, or calf pain - CONTACT YOUR SURGEON.                                                   FOLLOW-UP APPOINTMENTS Make sure you keep all of your appointments after your operation with your surgeon and caregivers. You should call the office at the above phone number and make an appointment for approximately two weeks after the date of your surgery or on the date instructed by your surgeon outlined in the "After Visit Summary".   RANGE OF MOTION AND STRENGTHENING EXERCISES  Rehabilitation of the knee is important following a knee injury or an  operation. After just a few days of immobilization, the muscles of the thigh which control the knee become weakened and shrink (atrophy). Knee exercises are designed to build up the tone and strength of the thigh muscles and to improve knee motion. Often times heat used for twenty to thirty minutes before working out will loosen up your tissues and help with improving the range of motion but do not use heat for the first two weeks following surgery. These exercises can be done on a training (exercise) mat, on the floor, on a table or on a bed. Use what ever works the best and is most comfortable for you Knee exercises include:  Leg Lifts - While your knee is still immobilized in a splint or cast, you can do straight leg raises. Lift the leg to 60 degrees, hold for 3 sec, and slowly lower the leg. Repeat 10-20 times 2-3 times daily. Perform this exercise against resistance later as your knee gets better.  Quad and Hamstring Sets - Tighten up the muscle on the front of the thigh (Quad) and hold for 5-10 sec. Repeat this 10-20 times hourly. Hamstring sets are done by pushing the foot backward against an object  and holding for 5-10 sec. Repeat as with quad sets.   Leg Slides: Lying on your back, slowly slide your foot toward your buttocks, bending your knee up off the floor (only go as far as is comfortable). Then slowly slide your foot back down until your leg is flat on the floor again.  Angel Wings: Lying on your back spread your legs to the side as far apart as you can without causing discomfort.  A rehabilitation program following serious knee injuries can speed recovery and prevent re-injury in the future due to weakened muscles. Contact your doctor or a physical therapist for more information on knee rehabilitation.   IF YOU ARE TRANSFERRED TO A SKILLED REHAB FACILITY If the patient is transferred to a skilled rehab facility following release from the hospital, a list of the current medications will be sent to the facility for the patient to continue.  When discharged from the skilled rehab facility, please have the facility set up the patient's [place] prior to being released. Also, the skilled facility will be responsible for providing the patient with their medications at time of release from the facility to include their pain medication, the muscle relaxants, and their blood thinner medication. If the patient is still at the rehab facility at time of the two week follow up appointment, the skilled rehab facility will also need to assist the patient in arranging follow up appointment in our office and any transportation needs.  MAKE SURE YOU:  Understand these instructions.  Get help right away if you are not doing well or get worse.    Pick up stool softner and laxative for home use following surgery while on pain medications. Do not submerge incision under water. Please use good hand washing techniques while changing dressing each day. May shower starting three days after surgery. Please use a clean towel to pat the incision dry following showers. Continue to use ice for  pain and swelling after surgery. Do not use any lotions or creams on the incision until instructed by your surgeon.  Take a full dose 325 mg Aspirin daily for three weeks, then reduce back to an 81 mg Aspirin daily at home.

## 2018-02-28 NOTE — Discharge Summary (Signed)
Physician Discharge Summary   Patient ID: Kristen Hansen MRN: [id number] DOB/AGE: 71-Jan-1947 71 y.o.  Admit date: 02/27/2018 Discharge date: 02/28/2018  Primary Diagnosis: Arthrofibrosis, right knee.   Admission Diagnoses:  Past Medical History:  Diagnosis Date  . Back pain   . Cancer (HCC)    Skin, basal cell  . Dyspnea   . Fatigue   . GERD (gastroesophageal reflux disease)   . Headache(784.0)    history of migraines  . History of kidney stones   . Hypercholesteremia   . Hypertension   . Leg swelling    Bilateral  . Occasional tremors    mild hand tremors  . Osteoarthritis   . PONV (postoperative nausea and vomiting)   . RBD (REM behavioral disorder)   . Rheumatoid arthritis(714.0)   . Scoliosis of lumbar spine   . Spinal stenosis    Multiple levels  . 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 Trigeminal neuralgia 03/30/2015  . Vision abnormalities    Discharge Diagnoses:   Principal Problem:   Arthrofibrosis of knee joint, right  Estimated body mass index is 28.55 kg/m as calculated from the following:   Height as of this encounter: 5' 9.5" (1.765 m).   Weight as of this encounter: 89 kg (196 lb 2 oz).  Procedure:  Procedure(s) (LRB): RIGHT KNEE ARTHROTOMY; scar excision (Right)   Consults: None  HPI: Kristen Hansen is a 72 year old female had a total knee arthroplasty done approximately 5 years ago.  She had done well until recently when she started to lose range of motion.  She did not respond to physical therapy and other nonoperative modalities.  She presents now for arthrotomy and scar excision secondary to arthrofibrosis.   Laboratory Data: Hospital Outpatient Visit on 02/20/2018  Component Date Value Ref Range Status  . aPTT 02/20/2018 30  24 - 36 seconds Final   Performed at [facility], [place] [address] [place], [place] [address]  . WBC 02/20/2018 5.4  4.0 - 10.5 K/uL Final  . RBC 02/20/2018 4.21  3.87 - 5.11 MIL/uL Final  . Hemoglobin 02/20/2018 12.4   12.0 - 15.0 g/dL Final  . HCT 02/20/2018 37.3  36.0 - 46.0 % Final  . MCV 02/20/2018 88.6  78.0 - 100.0 fL Final  . MCH 02/20/2018 29.5  26.0 - 34.0 pg Final  . MCHC 02/20/2018 33.2  30.0 - 36.0 g/dL Final  . RDW 02/20/2018 12.8  11.5 - 15.5 % Final  . Platelets 02/20/2018 205  150 - 400 K/uL Final   Performed at [facility], [place] [address], [place], [place] [address]  . Sodium 02/20/2018 137  135 - 145 mmol/L Final  . Potassium 02/20/2018 4.3  3.5 - 5.1 mmol/L Final  . Chloride 02/20/2018 106  101 - 111 mmol/L Final  . CO2 02/20/2018 23  22 - 32 mmol/L Final  . Glucose, Bld 02/20/2018 98  65 - 99 mg/dL Final  . BUN 02/20/2018 25* 6 - 20 mg/dL Final  . Creatinine, Ser 02/20/2018 1.05* 0.44 - 1.00 mg/dL Final  . Calcium 02/20/2018 9.1  8.9 - 10.3 mg/dL Final  . Total Protein 02/20/2018 6.8  6.5 - 8.1 g/dL Final  . Albumin 02/20/2018 3.9  3.5 - 5.0 g/dL Final  . AST 02/20/2018 22  15 - 41 U/L Final  . ALT 02/20/2018 22  14 - 54 U/L Final  . Alkaline Phosphatase 02/20/2018 111  38 - 126 U/L Final  . Total Bilirubin 02/20/2018 0.5  0.3 - 1.2 mg/dL Final  . GFR calc non Af Amer 02/20/2018 52* >60 mL/min Final  . GFR calc Af Amer 02/20/2018 >60  >60 mL/min Final   Comment: (NOTE) The eGFR has been calculated using the CKD EPI equation. This calculation has not been validated in all clinical situations. eGFR's persistently <60 mL/min signify possible Chronic Kidney Disease.   Kristen Hansen 02/20/2018 8  5 - 15 Final   Performed at [facility], [place] [address] [place], [place] [address]  . Prothrombin Time 02/20/2018 13.3  11.4 - 15.2 seconds Final  . INR 02/20/2018 1.02   Final   Performed at [facility], [place] [address], [place], [place] [address]  . ABO/RH(D) 02/20/2018 O POS   Final  . Antibody Screen 02/20/2018 NEG   Final  . Sample Expiration 02/20/2018 03/02/2018   Final  . Extend sample reason 02/20/2018    Final                    Value:NO TRANSFUSIONS OR PREGNANCY IN THE PAST 3 MONTHS Performed at [facility], [place] [address] [place], [place] [address]      X-Rays:Dg Chest 2 View  Result Date: 02/20/2018 CLINICAL DATA:  Preop respiratory exam for right knee surgery. Shortness of breath for several months. Hypertension EXAM: CHEST  2 VIEW COMPARISON:  01/16/2013 FINDINGS: The heart size and mediastinal contours are within normal limits. Both lungs are clear. The visualized skeletal structures are unremarkable. IMPRESSION: No active cardiopulmonary disease. Electronically Signed   By: Earle Gell M.D.   On: 02/20/2018 16:54    EKG: Orders placed or performed during the hospital encounter of 02/20/18  . EKG 12 lead  . EKG 12 lead     Hospital Course: Kristen Hansen is a 72 y.o. who was admitted to [facility]. They were brought to the operating room on 02/27/2018 and underwent Procedure(s): RIGHT KNEE ARTHROTOMY; scar excision.  Patient tolerated the procedure well and was later transferred to the recovery room and then to the orthopaedic floor for postoperative care.  They were given PO and IV analgesics for pain control following their surgery.  They were given 24 hours of postoperative antibiotics of  Anti-infectives (From admission, onward)   Start     Dose/Rate Route Frequency Ordered Stop   02/27/18 1630  ceFAZolin (ANCEF) IVPB 2g/100 mL premix     2 g 200 mL/hr over 30 Minutes Intravenous Every 6 hours 02/27/18 1100 02/27/18 2154   02/27/18 0651  ceFAZolin (ANCEF) IVPB 2g/100 mL premix     2 g 200 mL/hr over 30 Minutes Intravenous On call to O.R. 02/27/18 5681 02/27/18 2751     and started on DVT prophylaxis in the form of Lovenox.   PT and OT were ordered for postop therapy protocol.  Discharge planning consulted to help with postop disposition and equipment needs.  Patient had a good night on the evening of surgery.  They started to get up OOB with therapy on day one.   Patient was seen in rounds on day one and it was felt that as long as they did well with the remaining sessions of therapy that they would be ready to go home.  Arrangements were made and they were setup to go home on POD 1.  Diet - Cardiac diet Follow up - in 2 weeks Activity - WBAT Disposition - Home Condition Upon Discharge - Stable D/C Meds -  See DC Summary DVT Prophylaxis - Aspirin    Discharge Instructions    Call MD / Call 911   Complete by:  As directed    If you experience chest pain or shortness of breath, CALL 911 and be transported to the hospital emergency room.  If you develope a fever above 101 F, pus (white drainage) or increased drainage or redness at the wound, or calf pain, call your surgeon's office.   Change dressing   Complete by:  As directed    Change dressing daily with sterile 4 x 4 inch gauze dressing and apply TED hose. Do not submerge the incision under water.   Constipation Prevention   Complete by:  As directed    Drink plenty of fluids.  Prune juice may be helpful.  You may use a stool softener, such as Colace (over the counter) 100 mg twice a day.  Use MiraLax (over the counter) for constipation as needed.   Diet - low sodium heart healthy   Complete by:  As directed    Discharge instructions   Complete by:  As directed    Take full dose 325 mfg Aspirin daily for three weeks, then reduce back to 81 mg Aspirin daily at home.  Pick up stool softner and laxative for home use following surgery while on pain medications. Do not submerge incision under water. Please use good hand washing techniques while changing dressing each day. May shower starting three days after surgery. Please use a clean towel to pat the incision dry following showers. Continue to use ice for pain and swelling after surgery. Do not use any lotions or creams on the incision until instructed by your surgeon.  Wear both TED hose on both legs during the day every day for three weeks,  but may remove the TED hose at night at home.  Postoperative Constipation Protocol  Constipation - defined medically as fewer than three stools per week and severe constipation as less than one stool per week.  One of the most common issues patients have following surgery is constipation.  Even if you have a regular bowel pattern at home, your normal regimen is likely to be disrupted due to multiple reasons following surgery.  Combination of anesthesia, postoperative narcotics, change in appetite and fluid intake all can affect your bowels.  In order to avoid complications following surgery, here are some recommendations in order to help you during your recovery period.  Colace (docusate) - Pick up an over-the-counter form of Colace or another stool softener and take twice a day as long as you are requiring postoperative pain medications.  Take with a full glass of water daily.  If you experience loose stools or diarrhea, hold the colace until you stool forms back up.  If your symptoms do not get better within 1 week or if they get worse, check with your doctor.  Dulcolax (bisacodyl) - Pick up over-the-counter and take as directed by the product packaging as needed to assist with the movement of your bowels.  Take with a full glass of water.  Use this product as needed if not relieved by Colace only.   MiraLax (polyethylene glycol) - Pick up over-the-counter to have on hand.  MiraLax is a solution that will increase the amount of water in your bowels to assist with bowel movements.  Take as directed and can mix with a glass of water, juice, soda, coffee, or tea.  Take if you go more than two days without  a movement. Do not use MiraLax more than once per day. Call your doctor if you are still constipated or irregular after using this medication for 7 days in a row.  If you continue to have problems with postoperative constipation, please contact the office for further assistance and recommendations.   If you experience "the worst abdominal pain ever" or develop nausea or vomiting, please contact the office immediatly for further recommendations for treatment.   Do not put a pillow under the knee. Place it under the heel.   Complete by:  As directed    Do not sit on low chairs, stoools or toilet seats, as it may be difficult to get up from low surfaces   Complete by:  As directed    Driving restrictions   Complete by:  As directed    No driving until released by the physician.   Increase activity slowly as tolerated   Complete by:  As directed    Lifting restrictions   Complete by:  As directed    No lifting until released by the physician.   Patient may shower   Complete by:  As directed    You may shower without a dressing once there is no drainage.  Do not wash over the wound.  If drainage remains, do not shower until drainage stops.   TED hose   Complete by:  As directed    Use stockings (TED hose) for 3 weeks on both leg(s).  You may remove them at night for sleeping.   Weight bearing as tolerated   Complete by:  As directed    Laterality:  right   Extremity:  Lower     Allergies as of 02/28/2018      Reactions   Morphine And Related Hives   Prevacid [lansoprazole] Other (See Comments)   unknown   Provigil [modafinil] Other (See Comments)   dizziness   Adhesive [tape] Rash      Medication List    STOP taking these medications   BROMELAIN PO   CALTRATE 600+D PO   Co Q 10 100 MG Caps   ENBREL SURECLICK 50 MG/ML injection Generic drug:  etanercept   etodolac 400 MG tablet Commonly known as:  LODINE   hydroxychloroquine 200 MG tablet Commonly known as:  PLAQUENIL   methylPREDNISolone 4 MG Tbpk tablet Commonly known as:  MEDROL DOSEPAK   Vitamin D (Ergocalciferol) 50000 units Caps capsule Commonly known as:  DRISDOL     TAKE these medications   aspirin EC 325 MG tablet Take 1 tablet (325 mg total) by mouth daily. Take a full dose Aspirin 325 mg daily for  three weeks, then reduce back to the home baby 81 mg Aspirin daily. What changed:    medication strength  how much to take  additional instructions   AZOR 5-40 MG tablet Generic drug:  amLODipine-olmesartan Take 1 tablet by mouth every evening.   BREO ELLIPTA 100-25 MCG/INH Aepb Generic drug:  fluticasone furoate-vilanterol Inhale 1 puff into the lungs daily.   clonazePAM 0.5 MG tablet Commonly known as:  KLONOPIN TAKE 1 TABLET BY MOUTH AT BEDTIME   imipramine 25 MG tablet Commonly known as:  TOFRANIL TAKE ONE TABLET BY MOUTH AT BEDTIME   lamoTRIgine 100 MG tablet Commonly known as:  LAMICTAL TAKE ONE TABLET BY MOUTH TWICE DAILY What changed:    how much to take  how to take this  when to take this   loratadine 10 MG tablet Commonly known  as:  CLARITIN Take 10 mg by mouth daily.   methocarbamol 500 MG tablet Commonly known as:  ROBAXIN Take 1 tablet (500 mg total) by mouth every 6 (six) hours as needed for muscle spasms.   montelukast 10 MG tablet Commonly known as:  SINGULAIR Take 10 mg by mouth at bedtime.   NASONEX 50 MCG/ACT nasal spray Generic drug:  mometasone Place 1 spray into the nose daily as needed (CONGESTION).   omeprazole 20 MG capsule Commonly known as:  PRILOSEC Take 20 mg by mouth daily.   Oxcarbazepine 300 MG tablet Commonly known as:  TRILEPTAL TAKE ONE TABLET BY MOUTH IN THE MORNING, 1 AT NOON AND 2 AT BEDTIME What changed:  See the new instructions.   oxyCODONE 5 MG immediate release tablet Commonly known as:  Oxy IR/ROXICODONE Take 1-2 tablets (5-10 mg total) by mouth every 4 (four) hours as needed for moderate pain or severe pain.   pravastatin 40 MG tablet Commonly known as:  PRAVACHOL Take 40 mg by mouth daily before breakfast.   REFRESH OP Apply 1 drop to eye daily as needed (DRY EYES).   STOOL SOFTENER PO Take 1 tablet by mouth daily.   traMADol 50 MG tablet Commonly known as:  ULTRAM Take 1 tablet (50 mg total)  every 8 (eight) hours as needed by mouth.   Turmeric 500 MG Caps Take 1,000 mg by mouth daily.   valACYclovir 500 MG tablet Commonly known as:  VALTREX TAKE 2 TABLETS BY MOUTH EVERY 12 HOURS AS NEEDED FOR FEVER BLISTERS            Discharge Care Instructions  (From admission, onward)        Start     Ordered   02/28/18 0000  Weight bearing as tolerated    Question Answer Comment  Laterality right   Extremity Lower      02/28/18 0929   02/28/18 0000  Change dressing    Comments:  Change dressing daily with sterile 4 x 4 inch gauze dressing and apply TED hose. Do not submerge the incision under water.   02/28/18 5974     Follow-up Information    Gaynelle Arabian, MD. Schedule an appointment as soon as possible for a visit on 03/12/2018.   Specialty:  Orthopedic Surgery Contact information: [address] [place] [address] [phone]           Signed: Arlee Muslim, PA-C Orthopaedic Surgery 03/19/2018, 9:50 PM

## 2018-02-28 NOTE — Progress Notes (Signed)
   Subjective: 1 Day Post-Op Procedure(s) (LRB): RIGHT KNEE ARTHROTOMY; scar excision (Right) Patient reports pain as mild.   Patient seen in rounds for Dr. Wynelle Link. Patient is well, but has had some minor complaints of pain in the knee, requiring pain medications Patient is ready to go home  Objective: Vital signs in last 24 hours: Temp:  [97.4 F (36.3 C)-98.5 F (36.9 C)] 98 F (36.7 C) (03/07 0541) Pulse Rate:  [76-90] 85 (03/07 0541) Resp:  [11-17] 15 (03/07 0541) BP: (100-171)/(70-96) 165/83 (03/07 0541) SpO2:  [95 %-100 %] 97 % (03/07 0541)  Intake/Output from previous day:  Intake/Output Summary (Last 24 hours) at 02/28/2018 0924 Last data filed at 02/28/2018 0540 Gross per 24 hour  Intake 2415 ml  Output 1120 ml  Net 1295 ml    Intake/Output this shift: No intake/output data recorded.  Labs: No results for input(s): HGB in the last 72 hours. No results for input(s): WBC, RBC, HCT, PLT in the last 72 hours. No results for input(s): NA, K, CL, CO2, BUN, CREATININE, GLUCOSE, CALCIUM in the last 72 hours. No results for input(s): LABPT, INR in the last 72 hours.  EXAM: General - Patient is Alert and Appropriate Extremity - Neurovascular intact Sensation intact distally Incision - clean, dry Motor Function - intact, moving foot and toes well on exam.   Assessment/Plan: 1 Day Post-Op Procedure(s) (LRB): RIGHT KNEE ARTHROTOMY; scar excision (Right) Procedure(s) (LRB): RIGHT KNEE ARTHROTOMY; scar excision (Right) Past Medical History:  Diagnosis Date  . Back pain   . Cancer (HCC)    Skin, basal cell  . Dyspnea   . Fatigue   . GERD (gastroesophageal reflux disease)   . Headache(784.0)    history of migraines  . History of kidney stones   . Hypercholesteremia   . Hypertension   . Leg swelling    Bilateral  . Occasional tremors    mild hand tremors  . Osteoarthritis   . PONV (postoperative nausea and vomiting)   . RBD (REM behavioral disorder)   .  Rheumatoid arthritis(714.0)   . Scoliosis of lumbar spine   . Spinal stenosis    Multiple levels  . 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 Trigeminal neuralgia 03/30/2015  . Vision abnormalities    Principal Problem:   Arthrofibrosis of knee joint, right  Estimated body mass index is 28.55 kg/m as calculated from the following:   Height as of this encounter: 5' 9.5" (1.765 m).   Weight as of this encounter: 89 kg (196 lb 2 oz). Up with therapy Diet - Cardiac diet Follow up - in 2 weeks Activity - WBAT Disposition - Home Condition Upon Discharge - Stable D/C Meds - See DC Summary DVT Prophylaxis - Aspirin  Arlee Muslim, PA-C Orthopaedic Surgery 02/28/2018, 9:24 AM

## 2018-02-28 NOTE — Evaluation (Signed)
Physical Therapy Evaluation Patient Details Name: Kristen Hansen MRN: [id number] DOB: 08-11-1946 Today's Date: 02/28/2018   History of Present Illness  72 yo female s/p arthrotomy, scar excision 02/27/18. hx of R TKA, back pain, RA  Clinical Impression  On eval, pt was supervision level assist for mobility. She walked ~150 feet with a RW. Pain rated 5/10 with activity. Reviewed/practiced exercises, gait training, and stair training. Issued HEP for pt to perform 2x/day until she begins OP PT. All education completed. Okay to d/c from PT standpoint-made RN aware.     Follow Up Recommendations Follow surgeon's recommendation for DC plan and follow-up therapies    Equipment Recommendations  None recommended by PT    Recommendations for Other Services       Precautions / Restrictions Precautions Precautions: Fall Restrictions Weight Bearing Restrictions: No RLE Weight Bearing: Weight bearing as tolerated      Mobility  Bed Mobility Overal bed mobility: Modified Independent                Transfers Overall transfer level: Needs assistance Equipment used: Rolling walker (2 wheeled) Transfers: Sit to/from Stand Sit to Stand: Supervision         General transfer comment: for safety. VCs safety, hand/LE placement  Ambulation/Gait Ambulation/Gait assistance: Supervision Ambulation Distance (Feet): 150 Feet Assistive device: Rolling walker (2 wheeled) Gait Pattern/deviations: Step-through pattern;Decreased stride length     General Gait Details: for safety.   Stairs Stairs: Yes Stairs assistance: Supervision Stair Management: Two rails;Step to pattern;Forwards Number of Stairs: 6 General stair comments: VCs safety, technqiue, sequence. Encouraged pt to use step to pattern instead of reciprocal pattern for safety.   Wheelchair Mobility    Modified Rankin (Stroke Patients Only)       Balance                                              Pertinent Vitals/Pain Pain Assessment: 0-10 Pain Score: 5  Pain Location: R knee with activity Pain Descriptors / Indicators: Aching;Sore Pain Intervention(s): Monitored during session;Repositioned;Ice applied    Home Living Family/patient expects to be discharged to:: Private residence Living Arrangements: Spouse/significant other   Type of Home: House Home Access: Stairs to enter Entrance Stairs-Rails: Right;Left;Can reach both Entrance Stairs-Number of Steps: 13 Home Layout: Two level;Laundry or work area in [employer]: Bedside commode;Shower seat - built in;Walker - 2 wheels;Cane - single point      Prior Function Level of Independence: Independent               Hand Dominance        Extremity/Trunk Assessment   Upper Extremity Assessment Upper Extremity Assessment: Overall WFL for tasks assessed    Lower Extremity Assessment Lower Extremity Assessment: Generalized weakness(s/p R knee surgery)    Cervical / Trunk Assessment Cervical / Trunk Assessment: Normal  Communication   Communication: No difficulties  Cognition Arousal/Alertness: Awake/alert Behavior During Therapy: WFL for tasks assessed/performed Overall Cognitive Status: Within Functional Limits for tasks assessed                                        General Comments      Exercises Total Joint Exercises Ankle Circles/Pumps: AROM;Both;10 reps;Supine Quad Sets: AROM;Both;Supine;10 reps Heel Slides: AAROM;Right;10 reps;Supine  Hip ABduction/ADduction: AROM;Right;10 reps;Supine Straight Leg Raises: AROM;Right;10 reps;Supine Goniometric ROM: ~10-70 degrees   Assessment/Plan    PT Assessment Patient needs continued PT services  PT Problem List Decreased strength;Decreased balance;Decreased range of motion;Decreased mobility;Decreased activity tolerance;Pain;Decreased knowledge of use of DME       PT Treatment Interventions DME instruction;Gait  training;Functional mobility training;Balance training;Patient/family education;Therapeutic exercise;Stair training;Therapeutic activities    PT Goals (Current goals can be found in the Care Plan section)  Acute Rehab PT Goals Patient Stated Goal: home PT Goal Formulation: With patient/family Time For Goal Achievement: 03/14/18 Potential to Achieve Goals: Good    Frequency 7X/week   Barriers to discharge        Co-evaluation               AM-PAC PT "6 Clicks" Daily Activity  Outcome Measure Difficulty turning over in bed (including adjusting bedclothes, sheets and blankets)?: None Difficulty moving from lying on back to sitting on the side of the bed? : None Difficulty sitting down on and standing up from a chair with arms (e.g., wheelchair, bedside commode, etc,.)?: A Little Help needed moving to and from a bed to chair (including a wheelchair)?: A Little Help needed walking in hospital room?: A Little Help needed climbing 3-5 steps with a railing? : A Little 6 Click Score: 20    End of Session Equipment Utilized During Treatment: Gait belt Activity Tolerance: Patient tolerated treatment well Patient left: in chair;with call bell/phone within reach;with family/visitor present   PT Visit Diagnosis: Muscle weakness (generalized) (M62.81);Difficulty in walking, not elsewhere classified (R26.2);Pain Pain - Right/Left: Right Pain - part of body: Knee    Time: 4403-4742 PT Time Calculation (min) (ACUTE ONLY): 32 min   Charges:   PT Evaluation $PT Eval Low Complexity: 1 Low PT Treatments $Gait Training: 8-22 mins   PT G Codes:          Weston Anna, MPT Pager: [phone]

## 2018-03-06 ENCOUNTER — Ambulatory Visit: Payer: Medicare Other | Attending: Orthopedic Surgery | Admitting: Physical Therapy

## 2018-03-06 ENCOUNTER — Other Ambulatory Visit: Payer: Self-pay

## 2018-03-06 DIAGNOSIS — R262 Difficulty in walking, not elsewhere classified: Secondary | ICD-10-CM | POA: Insufficient documentation

## 2018-03-06 DIAGNOSIS — R2689 Other abnormalities of gait and mobility: Secondary | ICD-10-CM | POA: Diagnosis present

## 2018-03-06 DIAGNOSIS — R29898 Other symptoms and signs involving the musculoskeletal system: Secondary | ICD-10-CM | POA: Insufficient documentation

## 2018-03-06 DIAGNOSIS — M25561 Pain in right knee: Secondary | ICD-10-CM | POA: Insufficient documentation

## 2018-03-06 NOTE — Patient Instructions (Signed)
Chair Knee Flexion   Keeping feet on floor, slide foot of operated leg back, bending knee. Hold __10-15__ seconds. Repeat _10-15___ times.

## 2018-03-07 ENCOUNTER — Ambulatory Visit: Payer: Medicare Other | Admitting: Physical Therapy

## 2018-03-07 ENCOUNTER — Encounter: Payer: Self-pay | Admitting: Physical Therapy

## 2018-03-07 DIAGNOSIS — R29898 Other symptoms and signs involving the musculoskeletal system: Secondary | ICD-10-CM

## 2018-03-07 DIAGNOSIS — R2689 Other abnormalities of gait and mobility: Secondary | ICD-10-CM

## 2018-03-07 DIAGNOSIS — M25561 Pain in right knee: Secondary | ICD-10-CM

## 2018-03-07 DIAGNOSIS — R262 Difficulty in walking, not elsewhere classified: Secondary | ICD-10-CM

## 2018-03-07 NOTE — Therapy (Signed)
[facility] [address]  [place], [name], [address] Phone: [phone]   Fax:  [phone]  Physical Therapy Treatment  Patient Details  Name: Kristen Hansen MRN: [id number] Date of Birth: [birth year] Referring Provider: Dr. Gaynelle Arabian   Encounter Date: 03/07/2018  PT End of Session - 03/07/18 1625    Visit Number  2    Number of Visits  12    Date for PT Re-Evaluation  04/04/18    Authorization Type  Medicare    PT Start Time  1619    PT Stop Time  1702    PT Time Calculation (min)  43 min    Activity Tolerance  Patient tolerated treatment well    Behavior During Therapy  [facility] for tasks assessed/performed       Past Medical History:  Diagnosis Date  . Back pain   . Cancer (HCC)    Skin, basal cell  . Dyspnea   . Fatigue   . GERD (gastroesophageal reflux disease)   . Headache(784.0)    history of migraines  . History of kidney stones   . Hypercholesteremia   . Hypertension   . Leg swelling    Bilateral  . Occasional tremors    mild hand tremors  . Osteoarthritis   . PONV (postoperative nausea and vomiting)   . RBD (REM behavioral disorder)   . Rheumatoid arthritis(714.0)   . Scoliosis of lumbar spine   . Spinal stenosis    Multiple levels  . 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 Trigeminal neuralgia 03/30/2015  . Vision abnormalities     Past Surgical History:  Procedure Laterality Date  . ABDOMINAL HYSTERECTOMY    . AUGMENTATION MAMMAPLASTY Bilateral   . COLONOSCOPY    . JOINT REPLACEMENT     right uni knee, right hip with 2 revisions  . KIDNEY STONE SURGERY Bilateral   . KNEE ARTHROTOMY Right 02/27/2018   Procedure: RIGHT KNEE ARTHROTOMY; scar excision;  Surgeon: Gaynelle Arabian, MD;  Location: [facility];  Service: Orthopedics;  Laterality: Right;  . MOHS SURGERY    . TONSILLECTOMY    . TOTAL HIP ARTHROPLASTY    . TOTAL KNEE ARTHROPLASTY  01/22/2013   Procedure: TOTAL KNEE ARTHROPLASTY;  Surgeon: Gearlean Alf, MD;  Location: [facility];  Service: Orthopedics;  Laterality: Right;  Revision of a Right Uni Knee to a Total Knee Arthroplasty    There were no vitals filed for this visit.  Subjective Assessment - 03/07/18 1621    Subjective  feeling really sore today - unable to do HEP this morning    Patient is accompained by:  Family member    Patient Stated Goals  improve walking and mobility    Currently in Pain?  Yes    Pain Score  4     Pain Location  Knee    Pain Orientation  Right    Pain Descriptors / Indicators  Aching;Tightness;Sore    Pain Type  Acute pain;Surgical pain         [facility] PT Assessment - 03/06/18 1626      Assessment   Medical Diagnosis  s/p arthrotomy with scar excision    Referring Provider  Dr. Gaynelle Arabian    Onset Date/Surgical Date  02/27/18    Next MD Visit  03/12/18    Prior Therapy  yes - years afo      Precautions   Precautions  None  Restrictions   Weight Bearing Restrictions  No      Balance Screen   Has the patient fallen in the past 6 months  No    Has the patient had a decrease in activity level because of a fear of falling?   No    Is the patient reluctant to leave their home because of a fear of falling?   No      Home Environment   Living Environment  Private residence    Living Arrangements  Spouse/significant other    Type of [place] to enter    Entrance Stairs-Number of Steps  13    Entrance Stairs-Rails  Right;Left;Can reach both    [place]  Two level;Able to live on main level with bedroom/bathroom    Ideal - 2 wheels;Bedside commode      Prior Function   Level of Independence  Independent    Vocation  Retired    Industrial/product designer   Overall Cognitive Status  Within Functional Limits for tasks assessed      Observation/Other Assessments   Focus on Therapeutic Outcomes (FOTO)   Knee: 41 (59% limited, predicted 43% limited)      Sensation   Light Touch   Appears Intact      Coordination   Gross Motor Movements are Fluid and Coordinated  No due to recent surgery      ROM / Strength   AROM / PROM / Strength  AROM;Strength;PROM      AROM   AROM Assessment Site  Knee    Right/Left Knee  Right    Right Knee Extension  1    Right Knee Flexion  63      PROM   PROM Assessment Site  Knee    Right/Left Knee  Right    Right Knee Extension  1    Right Knee Flexion  68 up to 75 on recumbent bike      Strength   Overall Strength Comments  R LE grossly 3-/5 to 3/5      Ambulation/Gait   Ambulation/Gait  Yes    Ambulation/Gait Assistance  6: Modified independent (Device/Increase time)    Ambulation Distance (Feet)  100 Feet    Assistive device  Rolling walker    Gait Pattern  Step-through pattern;Decreased step length - left;Decreased stance time - right;Decreased hip/knee flexion - right;Decreased weight shift to right;Antalgic    Ambulation Surface  Level;Indoor    Gait velocity  decreased                  [facility] Adult PT Treatment/Exercise - 03/07/18 1624      Knee/Hip Exercises: Aerobic   Recumbent Bike  no resistance x 7 min for ROM      Knee/Hip Exercises: Standing   Functional Squat  10 reps L LE anterior to R to promote flexion      Knee/Hip Exercises: Supine   Short Arc Quad Sets  Strengthening;Right;15 reps 2#    Straight Leg Raises  Strengthening;Right;10 reps      Manual Therapy   Manual Therapy  Soft tissue mobilization;Joint mobilization;Passive ROM    Joint Mobilization  seat belt mobs - R knee    Soft tissue mobilization  STM to R quad mm. with roller stick    Passive ROM  PROM into R knee flexion - aggressive to end range  PT Education - 03/07/18 1247    Education provided  Yes    Education Details  exam findings, POC, HEP    Person(s) Educated  Patient;Spouse    Methods  Explanation;Demonstration;Handout    Comprehension  Verbalized understanding;Returned demonstration        PT Short Term Goals - 03/07/18 1359      PT SHORT TERM GOAL #1   Title  patient to be independent with initial HEP    Status  New    Target Date  03/21/18      PT SHORT TERM GOAL #2   Title  patient to improve R knee PROM  0 - 90    Status  New    Target Date  03/21/18        PT Long Term Goals - 03/07/18 1400      PT LONG TERM GOAL #1   Title  patient to be independent with advanced HEP    Status  New    Target Date  04/04/18      PT LONG TERM GOAL #2   Title  patient to improve R knee AROM 0-100    Status  New    Target Date  04/04/18      PT LONG TERM GOAL #3   Title  patient to demonstrate proper heel toe gait mechanics without evidence of instability    Status  New    Target Date  04/04/18      PT LONG TERM GOAL #4   Title  patient to improve R LE strength to >/= 4+/5    Status  New    Target Date  04/04/18      PT LONG TERM GOAL #5   Title  patient to demonstrate reciprocal gait pattern up/down 1 flight of stairs without evidence of instability    Status  New    Target Date  04/04/18            Plan - 03/07/18 1805    Clinical Impression Statement  Heavy manual today to promote increased knee flexion ROM. Patient achieving 75 degrees on recumbent bike and up to 82 degrees supine after heavy joint mobs and manual therapy at R knee/quad. Reinforced need to push into flexion over the weekend with heel slides seated and supine. Will continue to progress towards goals.     PT Treatment/Interventions  ADLs/Self Care Home Management;Cryotherapy;Electrical Stimulation;Iontophoresis 4mg /ml Dexamethasone;Moist Heat;Ultrasound;Gait training;Stair training;Therapeutic activities;Therapeutic exercise;Balance training;Neuromuscular re-education;Patient/family education;Manual techniques;Functional mobility training;DME Instruction;Passive range of motion;Dry needling;Vasopneumatic Device;Taping    Consulted and Agree with Plan of Care  Patient       Patient will  benefit from skilled therapeutic intervention in order to improve the following deficits and impairments:  Abnormal gait, Decreased activity tolerance, Decreased mobility, Decreased range of motion, Decreased strength, Difficulty walking, Increased muscle spasms, Increased fascial restricitons, Impaired flexibility, Pain  Visit Diagnosis: Acute pain of right knee  Difficulty in walking, not elsewhere classified  Other abnormalities of gait and mobility  Other symptoms and signs involving the musculoskeletal system     Problem List Patient Active Problem List   Diagnosis Date Noted  . 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 Excessive sleepiness 10/31/2016  . Avitaminosis D 03/30/2016  . Idiopathic peripheral neuropathy 02/23/2016  . BP (high blood pressure) 02/22/2016  . Other fatigue 10/20/2015  . Trigeminal neuralgia 03/30/2015  . 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 Polypharmacy 09/11/2014  . Other  long term (current) drug therapy 09/11/2014  . Arthritis or polyarthritis, rheumatoid ([place]) 05/14/2014  . Generalized OA 05/14/2014  . Rheumatoid arthritis ([place]) 05/14/2014  . Postop Hyponatremia 01/23/2013  . Postoperative anemia due to acute blood loss 01/23/2013  . Pain due to unicompartmental arthroplasty of knee ([place]) 01/22/2013  . Pain due to knee joint prosthesis ([place]) 01/22/2013  . Mucositis oral 08/20/2012    Lanney Gins, PT, DPT 03/07/18 6:10 PM   [facility] [address]  [place], [name], [address] Phone: [phone]   Fax:  [phone]  Name: Kristen Hansen MRN: [id number] Date of Birth: [birth year]

## 2018-03-07 NOTE — Therapy (Signed)
[facility] [address]  [place], [name], [address] Phone: [phone]   Fax:  [phone]  Physical Therapy Evaluation  Patient Details  Name: Kristen Hansen MRN: [id number] Date of Birth: Apr 11, 1946 Referring Provider: Dr. Gaynelle Arabian   Encounter Date: 03/06/2018  PT End of Session - 03/07/18 1248    Visit Number  1    Number of Visits  12    Date for PT Re-Evaluation  04/04/18    Authorization Type  Medicare    PT Start Time  3267    PT Stop Time  1245    PT Time Calculation (min)  54 min    Activity Tolerance  Patient tolerated treatment well    Behavior During Therapy  [facility] for tasks assessed/performed       Past Medical History:  Diagnosis Date  . Back pain   . Cancer (HCC)    Skin, basal cell  . Dyspnea   . Fatigue   . GERD (gastroesophageal reflux disease)   . Headache(784.0)    history of migraines  . History of kidney stones   . Hypercholesteremia   . Hypertension   . Leg swelling    Bilateral  . Occasional tremors    mild hand tremors  . Osteoarthritis   . PONV (postoperative nausea and vomiting)   . RBD (REM behavioral disorder)   . Rheumatoid arthritis(714.0)   . Scoliosis of lumbar spine   . Spinal stenosis    Multiple levels  . 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 Trigeminal neuralgia 03/30/2015  . Vision abnormalities     Past Surgical History:  Procedure Laterality Date  . ABDOMINAL HYSTERECTOMY    . AUGMENTATION MAMMAPLASTY Bilateral   . COLONOSCOPY    . JOINT REPLACEMENT     right uni knee, right hip with 2 revisions  . KIDNEY STONE SURGERY Bilateral   . KNEE ARTHROTOMY Right 02/27/2018   Procedure: RIGHT KNEE ARTHROTOMY; scar excision;  Surgeon: Gaynelle Arabian, MD;  Location: [facility];  Service: Orthopedics;  Laterality: Right;  . MOHS SURGERY    . TONSILLECTOMY    . TOTAL HIP ARTHROPLASTY    . TOTAL KNEE ARTHROPLASTY  01/22/2013   Procedure: TOTAL KNEE ARTHROPLASTY;  Surgeon: Gearlean Alf, MD;  Location: [facility];  Service: Orthopedics;  Laterality: Right;  Revision of a Right Uni Knee to a Total Knee Arthroplasty    There were no vitals filed for this visit.   Subjective Assessment - 03/06/18 1621    Subjective  complete revision of TKA 5 years ago - since then wasn't getting good mobility. Removal of scar tissue last week. Currently in knee immobilizer - has been in most of the time. Walking with RW. Has been doing HEP 1-2x day. Pain has been controlled well. Feels okay putting full weight in foot.     Patient is accompained by:  Family member husband    Patient Stated Goals  improve walking and mobility    Currently in Pain?  Yes    Pain Score  3     Pain Location  Knee    Pain Orientation  Right    Pain Descriptors / Indicators  Aching;Tightness    Pain Type  Acute pain;Surgical pain         [facility] PT Assessment - 03/06/18 1626      Assessment   Medical Diagnosis  s/p arthrotomy with scar excision    Referring Provider  Dr. Pilar Plate Aluisio    Onset Date/Surgical Date  02/27/18    Next MD Visit  03/12/18    Prior Therapy  yes - years afo      Precautions   Precautions  None      Restrictions   Weight Bearing Restrictions  No      Balance Screen   Has the patient fallen in the past 6 months  No    Has the patient had a decrease in activity level because of a fear of falling?   No    Is the patient reluctant to leave their home because of a fear of falling?   No      Home Environment   Living Environment  Private residence    Living Arrangements  Spouse/significant other    Type of [employer] to enter    Entrance Stairs-Number of Steps  13    Entrance Stairs-Rails  Right;Left;Can reach both    [place]  Two level;Able to live on main level with bedroom/bathroom    [place] - 2 wheels;Bedside commode      Prior Function   Level of Independence  Independent    Vocation  Retired    Energy manager   Overall Cognitive Status  Within Functional Limits for tasks assessed      Observation/Other Assessments   Focus on Therapeutic Outcomes (FOTO)   Knee: 41 (59% limited, predicted 43% limited)      Sensation   Light Touch  Appears Intact      Coordination   Gross Motor Movements are Fluid and Coordinated  No due to recent surgery      ROM / Strength   AROM / PROM / Strength  AROM;Strength;PROM      AROM   AROM Assessment Site  Knee    Right/Left Knee  Right    Right Knee Extension  1    Right Knee Flexion  63      PROM   PROM Assessment Site  Knee    Right/Left Knee  Right    Right Knee Extension  1    Right Knee Flexion  68 up to 75 on recumbent bike      Strength   Overall Strength Comments  R LE grossly 3-/5 to 3/5      Ambulation/Gait   Ambulation/Gait  Yes    Ambulation/Gait Assistance  6: Modified independent (Device/Increase time)    Ambulation Distance (Feet)  100 Feet    Assistive device  Rolling walker    Gait Pattern  Step-through pattern;Decreased step length - left;Decreased stance time - right;Decreased hip/knee flexion - right;Decreased weight shift to right;Antalgic    Ambulation Surface  Level;Indoor    Gait velocity  decreased             Objective measurements completed on examination: See above findings.      [facility] Adult PT Treatment/Exercise - 03/06/18 1626      Exercises   Exercises  Knee/Hip      Knee/Hip Exercises: Aerobic   Recumbent Bike  no resistance x 8 min for ROM      Knee/Hip Exercises: Seated   Heel Slides  AAROM;Right;10 reps for improved knee flexion      Knee/Hip Exercises: Supine   Quad Sets  Right;5 reps    Straight Leg Raises  Right;5 reps  PT Education - 03/07/18 1247    Education provided  Yes    Education Details  exam findings, POC, HEP    Person(s) Educated  Patient;Spouse    Methods  Explanation;Demonstration;Handout    Comprehension  Verbalized understanding;Returned  demonstration       PT Short Term Goals - 03/07/18 1359      PT SHORT TERM GOAL #1   Title  patient to be independent with initial HEP    Status  New    Target Date  03/21/18      PT SHORT TERM GOAL #2   Title  patient to improve R knee PROM  0 - 90    Status  New    Target Date  03/21/18        PT Long Term Goals - 03/07/18 1400      PT LONG TERM GOAL #1   Title  patient to be independent with advanced HEP    Status  New    Target Date  04/04/18      PT LONG TERM GOAL #2   Title  patient to improve R knee AROM 0-100    Status  New    Target Date  04/04/18      PT LONG TERM GOAL #3   Title  patient to demonstrate proper heel toe gait mechanics without evidence of instability    Status  New    Target Date  04/04/18      PT LONG TERM GOAL #4   Title  patient to improve R LE strength to >/= 4+/5    Status  New    Target Date  04/04/18      PT LONG TERM GOAL #5   Title  patient to demonstrate reciprocal gait pattern up/down 1 flight of stairs without evidence of instability    Status  New    Target Date  04/04/18             Plan - 03/07/18 1349    Clinical Impression Statement  Ms. Espino is a pleasant 72 y/o female presenting to [facility] today s/p R knee arthrotomy with scar excision on 02/27/18. Patient reporting AROM prior to surgery of up to 80 degrees with flexion with MD able to get 115-120 in OR. Patient presenting to O[facility]T with knee immobilizer as well as ace bandage at knee - PT advising to come out of immobilizer and wrap to allow for knee flexion, as this is most limited. Patient instructed on active assistive motion in sitting and supine to promote improved ROM. Patient to benefit from skilled PT to improve ROM, gait deficits, and general flexibility for improved functional mobility.     Clinical Presentation  Stable    Clinical Decision Making  Low    Rehab Potential  Good    PT Frequency  3x / week    PT Duration  4 weeks    PT Treatment/Interventions   ADLs/Self Care Home Management;Cryotherapy;Electrical Stimulation;Iontophoresis 4mg /ml Dexamethasone;Moist Heat;Ultrasound;Gait training;Stair training;Therapeutic activities;Therapeutic exercise;Balance training;Neuromuscular re-education;Patient/family education;Manual techniques;Functional mobility training;DME Instruction;Passive range of motion;Dry needling;Vasopneumatic Device;Taping    Consulted and Agree with Plan of Care  Patient       Patient will benefit from skilled therapeutic intervention in order to improve the following deficits and impairments:  Abnormal gait, Decreased activity tolerance, Decreased mobility, Decreased range of motion, Decreased strength, Difficulty walking, Increased muscle spasms, Increased fascial restricitons, Impaired flexibility, Pain  Visit Diagnosis: Acute pain of right knee  Difficulty in walking,  not elsewhere classified  Other abnormalities of gait and mobility  Other symptoms and signs involving the musculoskeletal system     Problem List Patient Active Problem List   Diagnosis Date Noted  . 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 Excessive sleepiness 10/31/2016  . Avitaminosis D 03/30/2016  . Idiopathic peripheral neuropathy 02/23/2016  . BP (high blood pressure) 02/22/2016  . Other fatigue 10/20/2015  . Trigeminal neuralgia 03/30/2015  . 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 Polypharmacy 09/11/2014  . Other long term (current) drug therapy 09/11/2014  . Arthritis or polyarthritis, rheumatoid ([place]) 05/14/2014  . Generalized OA 05/14/2014  . Rheumatoid arthritis ([place]) 05/14/2014  . Postop Hyponatremia 01/23/2013  . Postoperative anemia due to acute blood loss 01/23/2013  . Pain due to unicompartmental arthroplasty of knee ([place]) 01/22/2013  . Pain due to knee joint prosthesis ([place]) 01/22/2013  . Mucositis oral 08/20/2012     Lanney Gins, PT, DPT 03/07/18 2:03 PM   [facility] [address]  [place], [name], [address] Phone: [phone]   Fax:  [phone]  Name: Kristen Hansen MRN: [id number] Date of Birth: 11-02-1946

## 2018-03-07 NOTE — Addendum Note (Signed)
Addended by: Allena Katz R on: 03/07/2018 02:04 PM   Modules accepted: Orders

## 2018-03-11 ENCOUNTER — Ambulatory Visit: Payer: Medicare Other | Admitting: Physical Therapy

## 2018-03-11 ENCOUNTER — Encounter: Payer: Self-pay | Admitting: Physical Therapy

## 2018-03-11 DIAGNOSIS — M25561 Pain in right knee: Secondary | ICD-10-CM | POA: Diagnosis not present

## 2018-03-11 DIAGNOSIS — R2689 Other abnormalities of gait and mobility: Secondary | ICD-10-CM

## 2018-03-11 DIAGNOSIS — R262 Difficulty in walking, not elsewhere classified: Secondary | ICD-10-CM

## 2018-03-11 DIAGNOSIS — R29898 Other symptoms and signs involving the musculoskeletal system: Secondary | ICD-10-CM

## 2018-03-11 NOTE — Therapy (Signed)
[facility] [address]  [place], [name], [address] Phone: [phone]   Fax:  [phone]  Physical Therapy Treatment  Patient Details  Name: Kristen Hansen MRN: [id number] Date of Birth: [birth year] Referring Provider: Dr. Gaynelle Arabian   Encounter Date: 03/11/2018  PT End of Session - 03/11/18 1528    Visit Number  3    Number of Visits  12    Date for PT Re-Evaluation  04/04/18    Authorization Type  Medicare    PT Start Time  1528    PT Stop Time  1625    PT Time Calculation (min)  57 min    Activity Tolerance  Patient tolerated treatment well    Behavior During Therapy  [facility] for tasks assessed/performed       Past Medical History:  Diagnosis Date  . Back pain   . Cancer (HCC)    Skin, basal cell  . Dyspnea   . Fatigue   . GERD (gastroesophageal reflux disease)   . Headache(784.0)    history of migraines  . History of kidney stones   . Hypercholesteremia   . Hypertension   . Leg swelling    Bilateral  . Occasional tremors    mild hand tremors  . Osteoarthritis   . PONV (postoperative nausea and vomiting)   . RBD (REM behavioral disorder)   . Rheumatoid arthritis(714.0)   . Scoliosis of lumbar spine   . Spinal stenosis    Multiple levels  . 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 Trigeminal neuralgia 03/30/2015  . Vision abnormalities     Past Surgical History:  Procedure Laterality Date  . ABDOMINAL HYSTERECTOMY    . AUGMENTATION MAMMAPLASTY Bilateral   . COLONOSCOPY    . JOINT REPLACEMENT     right uni knee, right hip with 2 revisions  . KIDNEY STONE SURGERY Bilateral   . KNEE ARTHROTOMY Right 02/27/2018   Procedure: RIGHT KNEE ARTHROTOMY; scar excision;  Surgeon: Gaynelle Arabian, MD;  Location: [facility];  Service: Orthopedics;  Laterality: Right;  . MOHS SURGERY    . TONSILLECTOMY    . TOTAL HIP ARTHROPLASTY    . TOTAL KNEE ARTHROPLASTY  01/22/2013   Procedure: TOTAL KNEE ARTHROPLASTY;  Surgeon: Gearlean Alf, MD;  Location: [facility];  Service: Orthopedics;  Laterality: Right;  Revision of a Right Uni Knee to a Total Knee Arthroplasty    There were no vitals filed for this visit.  Subjective Assessment - 03/11/18 1530    Subjective  Pt reporting she took a pain pill about 2 hrs ago but is not feeling like this helped.  Pt reports she also has "lower back issues" that she went into surgery with but had not been a problem until yesterday and now feels limited by this as well.    Patient is accompained by:  Family member    Patient Stated Goals  improve walking and mobility    Currently in Pain?  Yes    Pain Score  5     Pain Location  Knee    Pain Orientation  Right;Anterior    Pain Descriptors / Indicators  Aching;Tightness;Sore    Pain Type  Acute pain;Surgical pain    Pain Frequency  Constant    Multiple Pain Sites  Yes    Pain Score  4    Pain Location  Back    Pain Orientation  Left;Lower    Pain Descriptors / Indicators  -- "  just there"    Pain Type  Acute pain;Chronic pain    Pain Onset  Yesterday    Pain Frequency  Intermittent         [facility] PT Assessment - 03/11/18 1528      Assessment   Medical Diagnosis  s/p arthrotomy with scar excision    Referring Provider  Dr. Gaynelle Arabian    Onset Date/Surgical Date  02/27/18    Next MD Visit  03/12/18      AROM   Right Knee Extension  1    Right Knee Flexion  86      PROM   Right Knee Extension  1    Right Knee Flexion  90                  [facility] Adult PT Treatment/Exercise - 03/11/18 1528      Exercises   Exercises  Knee/Hip      Knee/Hip Exercises: Aerobic   Recumbent Bike  no resistance x 7 min for ROM      Knee/Hip Exercises: Standing   Forward Step Up  Right;15 reps;Hand Hold: 2;Step Height: 6"    Forward Step Up Limitations  exaggerated hip/knee flexion on R    Functional Squat  10 reps L LE anterior to R to promote flexion    Functional Squat Limitations  TRX      Knee/Hip Exercises: Seated    Heel Slides  Right;15 reps    Heel Slides Limitations  foot on FR to reduce friction    Other Seated Knee/Hip Exercises  Fitter leg press (1 black/1 blue) x15 - AAROM for knee flexion on release    Hamstring Curl  Right;15 reps;Strengthening    Hamstring Limitations  red TB      Knee/Hip Exercises: Supine   Knee Flexion  Right;15 reps    Knee Flexion Limitations  pt holding hip in 90dg flexion      Modalities   Modalities  Vasopneumatic      Vasopneumatic   Number Minutes Vasopneumatic   10 minutes    Vasopnuematic Location   Knee    Vasopneumatic Pressure  Medium    Vasopneumatic Temperature   coldest      Manual Therapy   Manual Therapy  Joint mobilization;Soft tissue mobilization;Passive ROM    Manual therapy comments  pt hooklying    Joint Mobilization  P/A tibia on femur to promote knee flexion; inferior and medial patellar glides    Soft tissue mobilization  STM to R quad mm., emphasis on VL    Passive ROM  PROM into R knee flexion - aggressive to end range               PT Short Term Goals - 03/07/18 1359      PT SHORT TERM GOAL #1   Title  patient to be independent with initial HEP    Status  New    Target Date  03/21/18      PT SHORT TERM GOAL #2   Title  patient to improve R knee PROM  0 - 90    Status  New    Target Date  03/21/18        PT Long Term Goals - 03/07/18 1400      PT LONG TERM GOAL #1   Title  patient to be independent with advanced HEP    Status  New    Target Date  04/04/18      PT  LONG TERM GOAL #2   Title  patient to improve R knee AROM 0-100    Status  New    Target Date  04/04/18      PT LONG TERM GOAL #3   Title  patient to demonstrate proper heel toe gait mechanics without evidence of instability    Status  New    Target Date  04/04/18      PT LONG TERM GOAL #4   Title  patient to improve R LE strength to >/= 4+/5    Status  New    Target Date  04/04/18      PT LONG TERM GOAL #5   Title  patient to  demonstrate reciprocal gait pattern up/down 1 flight of stairs without evidence of instability    Status  New    Target Date  04/04/18            Plan - 03/11/18 1534    Clinical Impression Statement  Jimmie noting improving functtional knee flexion with increasing ease of getting in/out of car. R knee ROM improving with AROM now 1-86 dg and PROM 1-90. Pt reporting increased pain and swelling w/o significant relief from pain meds, therefore concluded treatment with vasopneumatic compression to promote edema and pain reduction.    PT Treatment/Interventions  ADLs/Self Care Home Management;Cryotherapy;Electrical Stimulation;Iontophoresis 4mg /ml Dexamethasone;Moist Heat;Ultrasound;Gait training;Stair training;Therapeutic activities;Therapeutic exercise;Balance training;Neuromuscular re-education;Patient/family education;Manual techniques;Functional mobility training;DME Instruction;Passive range of motion;Dry needling;Vasopneumatic Device;Taping    Consulted and Agree with Plan of Care  Patient       Patient will benefit from skilled therapeutic intervention in order to improve the following deficits and impairments:  Abnormal gait, Decreased activity tolerance, Decreased mobility, Decreased range of motion, Decreased strength, Difficulty walking, Increased muscle spasms, Increased fascial restricitons, Impaired flexibility, Pain  Visit Diagnosis: Acute pain of right knee  Difficulty in walking, not elsewhere classified  Other abnormalities of gait and mobility  Other symptoms and signs involving the musculoskeletal system     Problem List Patient Active Problem List   Diagnosis Date Noted  . 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 Excessive sleepiness 10/31/2016  . Avitaminosis D 03/30/2016  . Idiopathic peripheral neuropathy 02/23/2016  . BP (high blood pressure) 02/22/2016  . Other fatigue 10/20/2015  . Trigeminal neuralgia  03/30/2015  . 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 Polypharmacy 09/11/2014  . Other long term (current) drug therapy 09/11/2014  . Arthritis or polyarthritis, rheumatoid ([place]) 05/14/2014  . Generalized OA 05/14/2014  . Rheumatoid arthritis ([place]) 05/14/2014  . Postop Hyponatremia 01/23/2013  . Postoperative anemia due to acute blood loss 01/23/2013  . Pain due to unicompartmental arthroplasty of knee ([place]) 01/22/2013  . Pain due to knee joint prosthesis ([place]) 01/22/2013  . Mucositis oral 08/20/2012    Percival Spanish, PT, MPT 03/11/2018, 4:25 PM  [facility] [address]  [place], [name], [address] Phone: [phone]   Fax:  [phone]  Name: HAWRAA STAMBAUGH MRN: [id number] Date of Birth: [birth year]

## 2018-03-13 ENCOUNTER — Ambulatory Visit: Payer: Medicare Other | Admitting: Physical Therapy

## 2018-03-13 ENCOUNTER — Encounter: Payer: Self-pay | Admitting: Physical Therapy

## 2018-03-13 DIAGNOSIS — R2689 Other abnormalities of gait and mobility: Secondary | ICD-10-CM

## 2018-03-13 DIAGNOSIS — R29898 Other symptoms and signs involving the musculoskeletal system: Secondary | ICD-10-CM

## 2018-03-13 DIAGNOSIS — M25561 Pain in right knee: Secondary | ICD-10-CM | POA: Diagnosis not present

## 2018-03-13 DIAGNOSIS — R262 Difficulty in walking, not elsewhere classified: Secondary | ICD-10-CM

## 2018-03-13 NOTE — Patient Instructions (Signed)
KNEE: Quadriceps - Prone   Place strap around ankle. Bring ankle toward buttocks. Press hip into surface. Hold __30-60_ seconds. __3_ reps per set, _2__ sets per day

## 2018-03-13 NOTE — Therapy (Signed)
[facility] [address]  [place], [name], [address] Phone: [phone]   Fax:  [phone]  Physical Therapy Treatment  Patient Details  Name: Kristen Hansen MRN: [id number] Date of Birth: 05/31/1946 Referring Provider: Dr. Gaynelle Arabian   Encounter Date: 03/13/2018  PT End of Session - 03/13/18 1603    Visit Number  4    Number of Visits  12    Date for PT Re-Evaluation  04/04/18    Authorization Type  Medicare    PT Start Time  1601    PT Stop Time  1659    PT Time Calculation (min)  58 min    Activity Tolerance  Patient tolerated treatment well    Behavior During Therapy  [facility] for tasks assessed/performed       Past Medical History:  Diagnosis Date  . Back pain   . Cancer (HCC)    Skin, basal cell  . Dyspnea   . Fatigue   . GERD (gastroesophageal reflux disease)   . Headache(784.0)    history of migraines  . History of kidney stones   . Hypercholesteremia   . Hypertension   . Leg swelling    Bilateral  . Occasional tremors    mild hand tremors  . Osteoarthritis   . PONV (postoperative nausea and vomiting)   . RBD (REM behavioral disorder)   . Rheumatoid arthritis(714.0)   . Scoliosis of lumbar spine   . Spinal stenosis    Multiple levels  . 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 Trigeminal neuralgia 03/30/2015  . Vision abnormalities     Past Surgical History:  Procedure Laterality Date  . ABDOMINAL HYSTERECTOMY    . AUGMENTATION MAMMAPLASTY Bilateral   . COLONOSCOPY    . JOINT REPLACEMENT     right uni knee, right hip with 2 revisions  . KIDNEY STONE SURGERY Bilateral   . KNEE ARTHROTOMY Right 02/27/2018   Procedure: RIGHT KNEE ARTHROTOMY; scar excision;  Surgeon: Gaynelle Arabian, MD;  Location: [facility];  Service: Orthopedics;  Laterality: Right;  . MOHS SURGERY    . TONSILLECTOMY    . TOTAL HIP ARTHROPLASTY    . TOTAL KNEE ARTHROPLASTY  01/22/2013   Procedure: TOTAL KNEE ARTHROPLASTY;  Surgeon: Gearlean Alf, MD;  Location: [facility];  Service: Orthopedics;  Laterality: Right;  Revision of a Right Uni Knee to a Total Knee Arthroplasty    There were no vitals filed for this visit.  Subjective Assessment - 03/13/18 1602    Subjective  MD pleased with current progress - wants her to be 3x/week for 4 more weeks; took off bandages; education on pain relievers vs muscle relaxers.     Patient Stated Goals  improve walking and mobility    Currently in Pain?  Yes    Pain Score  3     Pain Location  Knee    Pain Orientation  Right    Pain Descriptors / Indicators  Aching;Sore;Tightness;Discomfort    Pain Type  Acute pain;Surgical pain                      [facility] Adult PT Treatment/Exercise - 03/13/18 0001      Ambulation/Gait   Ambulation/Gait  Yes    Ambulation/Gait Assistance  6: Modified independent (Device/Increase time)    Ambulation Distance (Feet)  200 Feet    Assistive device  Straight cane    Gait Pattern  Step-through pattern;Decreased weight  shift to right;Decreased hip/knee flexion - right    Ambulation Surface  Level;Indoor    Gait Comments  gait training with SPC - improved upright posture      Exercises   Exercises  Knee/Hip      Knee/Hip Exercises: Stretches   Sports administrator  Right;3 reps;60 seconds prone with strap      Knee/Hip Exercises: Aerobic   Recumbent Bike  no resistance x 7 min for ROM - full revolutions      Knee/Hip Exercises: Machines for Strengthening   Cybex Knee Extension  B LE - 20# x 15      Knee/Hip Exercises: Seated   Heel Slides  Right;15 reps      Knee/Hip Exercises: Supine   Knee Flexion  Right;15 reps    Knee Flexion Limitations  use of peanut ball and strap      Modalities   Modalities  Vasopneumatic      Vasopneumatic   Number Minutes Vasopneumatic   15 minutes    Vasopnuematic Location   Knee    Vasopneumatic Pressure  High    Vasopneumatic Temperature   coldest      Manual Therapy   Manual Therapy  Joint  mobilization;Passive ROM    Manual therapy comments  pt hooklying    Joint Mobilization  PA tibia on femur; patellar mobs all planes    Passive ROM  PROM into R knee flexion - aggressive to end range               PT Short Term Goals - 03/13/18 1604      PT SHORT TERM GOAL #1   Title  patient to be independent with initial HEP    Status  On-going      PT SHORT TERM GOAL #2   Title  patient to improve R knee PROM  0 - 90    Status  On-going        PT Long Term Goals - 03/13/18 1604      PT LONG TERM GOAL #1   Title  patient to be independent with advanced HEP    Status  On-going      PT LONG TERM GOAL #2   Title  patient to improve R knee AROM 0-100    Status  On-going      PT LONG TERM GOAL #3   Title  patient to demonstrate proper heel toe gait mechanics without evidence of instability    Status  On-going      PT LONG TERM GOAL #4   Title  patient to improve R LE strength to >/= 4+/5    Status  On-going      PT LONG TERM GOAL #5   Title  patient to demonstrate reciprocal gait pattern up/down 1 flight of stairs without evidence of instability    Status  On-going            Plan - 03/13/18 1604    Clinical Impression Statement  Ivin Booty seen  by MD - pleased with current progress. patient now able to complete full revolutions on recumbent bike with knee flexion PROM now at 95 degrees. Patient feeling better overall and encouraged with current progress. Gait training today with SPC with improved upright posturing. Will continue to progress functional limitations at upcoming visits.     PT Treatment/Interventions  ADLs/Self Care Home Management;Cryotherapy;Electrical Stimulation;Iontophoresis 4mg /ml Dexamethasone;Moist Heat;Ultrasound;Gait training;Stair training;Therapeutic activities;Therapeutic exercise;Balance training;Neuromuscular re-education;Patient/family education;Manual techniques;Functional mobility training;DME Instruction;Passive range of motion;Dry  needling;Vasopneumatic  Device;Taping    Consulted and Agree with Plan of Care  Patient       Patient will benefit from skilled therapeutic intervention in order to improve the following deficits and impairments:  Abnormal gait, Decreased activity tolerance, Decreased mobility, Decreased range of motion, Decreased strength, Difficulty walking, Increased muscle spasms, Increased fascial restricitons, Impaired flexibility, Pain  Visit Diagnosis: Acute pain of right knee  Difficulty in walking, not elsewhere classified  Other abnormalities of gait and mobility  Other symptoms and signs involving the musculoskeletal system     Problem List Patient Active Problem List   Diagnosis Date Noted  . 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 Excessive sleepiness 10/31/2016  . Avitaminosis D 03/30/2016  . Idiopathic peripheral neuropathy 02/23/2016  . BP (high blood pressure) 02/22/2016  . Other fatigue 10/20/2015  . Trigeminal neuralgia 03/30/2015  . 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 Polypharmacy 09/11/2014  . Other long term (current) drug therapy 09/11/2014  . Arthritis or polyarthritis, rheumatoid ([place]) 05/14/2014  . Generalized OA 05/14/2014  . Rheumatoid arthritis ([place]) 05/14/2014  . Postop Hyponatremia 01/23/2013  . Postoperative anemia due to acute blood loss 01/23/2013  . Pain due to unicompartmental arthroplasty of knee ([place]) 01/22/2013  . Pain due to knee joint prosthesis ([place]) 01/22/2013  . Mucositis oral 08/20/2012     Lanney Gins, PT, DPT 03/13/18 5:58 PM   [facility] [address]  [place], [name], [address] Phone: [phone]   Fax:  [phone]  Name: MALALA TRENKAMP MRN: [id number] Date of Birth: Dec 30, 1945

## 2018-03-15 ENCOUNTER — Ambulatory Visit: Payer: Medicare Other | Admitting: Physical Therapy

## 2018-03-15 ENCOUNTER — Encounter: Payer: Self-pay | Admitting: Physical Therapy

## 2018-03-15 DIAGNOSIS — R262 Difficulty in walking, not elsewhere classified: Secondary | ICD-10-CM

## 2018-03-15 DIAGNOSIS — R2689 Other abnormalities of gait and mobility: Secondary | ICD-10-CM

## 2018-03-15 DIAGNOSIS — R29898 Other symptoms and signs involving the musculoskeletal system: Secondary | ICD-10-CM

## 2018-03-15 DIAGNOSIS — M25561 Pain in right knee: Secondary | ICD-10-CM

## 2018-03-15 NOTE — Therapy (Signed)
[facility] [address]  [place], [name], [address] Phone: [phone]   Fax:  [phone]  Physical Therapy Treatment  Patient Details  Name: Kristen Hansen MRN: [id number] Date of Birth: 09-25-1946 Referring Provider: Dr. Gaynelle Hansen   Encounter Date: 03/15/2018  PT End of Session - 03/15/18 0931    Visit Number  5    Number of Visits  12    Date for PT Re-Evaluation  04/04/18    Authorization Type  Medicare    PT Start Time  0931    PT Stop Time  1019    PT Time Calculation (min)  48 min    Activity Tolerance  Patient tolerated treatment well    Behavior During Therapy  [facility] for tasks assessed/performed       Past Medical History:  Diagnosis Date  . Back pain   . Cancer (HCC)    Skin, basal cell  . Dyspnea   . Fatigue   . GERD (gastroesophageal reflux disease)   . Headache(784.0)    history of migraines  . History of kidney stones   . Hypercholesteremia   . Hypertension   . Leg swelling    Bilateral  . Occasional tremors    mild hand tremors  . Osteoarthritis   . PONV (postoperative nausea and vomiting)   . RBD (REM behavioral disorder)   . Rheumatoid arthritis(714.0)   . Scoliosis of lumbar spine   . Spinal stenosis    Multiple levels  . 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 Trigeminal neuralgia 03/30/2015  . Vision abnormalities     Past Surgical History:  Procedure Laterality Date  . ABDOMINAL HYSTERECTOMY    . AUGMENTATION MAMMAPLASTY Bilateral   . COLONOSCOPY    . JOINT REPLACEMENT     right uni knee, right hip with 2 revisions  . KIDNEY STONE SURGERY Bilateral   . KNEE ARTHROTOMY Right 02/27/2018   Procedure: RIGHT KNEE ARTHROTOMY; scar excision;  Surgeon: Kristen Arabian, MD;  Location: [facility];  Service: Orthopedics;  Laterality: Right;  . MOHS SURGERY    . TONSILLECTOMY    . TOTAL HIP ARTHROPLASTY    . TOTAL KNEE ARTHROPLASTY  01/22/2013   Procedure: TOTAL KNEE ARTHROPLASTY;  Surgeon: Kristen Alf, MD;  [facility];  Service: Orthopedics;  Laterality: Right;  Revision of a Right Uni Knee to a Total Knee Arthroplasty    There were no vitals filed for this visit.  Subjective Assessment - 03/15/18 0931    Subjective  Pt. stated that it has been hurting throughout the day, late afternoon/nighttime after doing daily activities. Pt. has taken a muscle relaxer prior to coming to PT today. Aching sensation across the middle of R knee. Pt. feels like she is "leaning" toward the left when sitting/standing. Pt. is also having issues getting out of a chair.     Patient Stated Goals  improve walking and mobility    Currently in Pain?  Yes    Pain Score  3     Pain Location  Knee    Pain Orientation  Right    Pain Descriptors / Indicators  Aching                No data recorded       [facility] Adult PT Treatment/Exercise - 03/15/18 0931      Exercises   Exercises  Knee/Hip      Lumbar Exercises: Aerobic   Elliptical  --  Knee/Hip Exercises: Aerobic   Recumbent Bike  no resistance; 6"; full revolutions       Knee/Hip Exercises: Machines for Strengthening   Cybex Knee Extension  B LE - 20# x 15    Cybex Knee Flexion  B LE - 20# x 15    Cybex Leg Press  B LE - 15# x 15 facilitate an increase knee flexion ROM with return      Knee/Hip Exercises: Standing   Terminal Knee Extension  Strengthening;Right;15 reps 3 second hold    Lateral Step Up  Right;15 reps;Hand Hold: 2;Step Height: 6"    Lateral Step Up Limitations  Cueing for terminal knee extension      Modalities   Modalities  Vasopneumatic      Vasopneumatic   Number Minutes Vasopneumatic   10 minutes    Vasopnuematic Location   Knee    Vasopneumatic Pressure  High    Vasopneumatic Temperature   coldest               PT Short Term Goals - 03/13/18 1604      PT SHORT TERM GOAL #1   Title  patient to be independent with initial HEP    Status  On-going      PT SHORT TERM GOAL #2   Title   patient to improve R knee PROM  0 - 90    Status  On-going        PT Long Term Goals - 03/13/18 1604      PT LONG TERM GOAL #1   Title  patient to be independent with advanced HEP    Status  On-going      PT LONG TERM GOAL #2   Title  patient to improve R knee AROM 0-100    Status  On-going      PT LONG TERM GOAL #3   Title  patient to demonstrate proper heel toe gait mechanics without evidence of instability    Status  On-going      PT LONG TERM GOAL #4   Title  patient to improve R LE strength to >/= 4+/5    Status  On-going      PT LONG TERM GOAL #5   Title  patient to demonstrate reciprocal gait pattern up/down 1 flight of stairs without evidence of instability    Status  On-going            Plan - 03/15/18 0931    Clinical Impression Statement  Kristen Hansen seemed to be doing well today, was in mild pain. Started on the recumbent bike and completed full revolutions, however still no resistance. Did well with therapauetic exercises, however required some cueing with lateral step ups for terminal knee extension. The exercises selected will help facilitate an increase in R knee ROM. Stated at the end of session, that the machine exercises, "gave her a good workout". Finished with vasopneumatic on R knee to help with pain/edema.    PT Treatment/Interventions  ADLs/Self Care Home Management;Cryotherapy;Electrical Stimulation;Iontophoresis 4mg /ml Dexamethasone;Moist Heat;Ultrasound;Gait training;Stair training;Therapeutic activities;Therapeutic exercise;Balance training;Neuromuscular re-education;Patient/family education;Manual techniques;Functional mobility training;DME Instruction;Passive range of motion;Dry needling;Vasopneumatic Device;Taping    Consulted and Agree with Plan of Care  Patient       Patient will benefit from skilled therapeutic intervention in order to improve the following deficits and impairments:  Abnormal gait, Decreased activity tolerance, Decreased  mobility, Decreased range of motion, Decreased strength, Difficulty walking, Increased muscle spasms, Increased fascial restricitons, Impaired flexibility, Pain  Visit Diagnosis:  Acute pain of right knee  Difficulty in walking, not elsewhere classified  Other abnormalities of gait and mobility  Other symptoms and signs involving the musculoskeletal system     Problem List Patient Active Problem List   Diagnosis Date Noted  . 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 Excessive sleepiness 10/31/2016  . Avitaminosis D 03/30/2016  . Idiopathic peripheral neuropathy 02/23/2016  . BP (high blood pressure) 02/22/2016  . Other fatigue 10/20/2015  . Trigeminal neuralgia 03/30/2015  . 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 Polypharmacy 09/11/2014  . Other long term (current) drug therapy 09/11/2014  . Arthritis or polyarthritis, rheumatoid ([place]) 05/14/2014  . Generalized OA 05/14/2014  . Rheumatoid arthritis ([place]) 05/14/2014  . Postop Hyponatremia 01/23/2013  . Postoperative anemia due to acute blood loss 01/23/2013  . Pain due to unicompartmental arthroplasty of knee ([place]) 01/22/2013  . Pain due to knee joint prosthesis ([place]) 01/22/2013  . Mucositis oral 08/20/2012    Baldomero Lamy, SPT 03/15/2018, 12:34 PM  [facility] [address]  [place], [name], [address] Phone: [phone]   Fax:  [phone]  Name: Kristen Hansen MRN: [id number] Date of Birth: 12/15/1946

## 2018-03-18 ENCOUNTER — Ambulatory Visit: Payer: Medicare Other | Admitting: Physical Therapy

## 2018-03-18 ENCOUNTER — Encounter: Payer: Self-pay | Admitting: Physical Therapy

## 2018-03-18 DIAGNOSIS — R2689 Other abnormalities of gait and mobility: Secondary | ICD-10-CM

## 2018-03-18 DIAGNOSIS — M25561 Pain in right knee: Secondary | ICD-10-CM | POA: Diagnosis not present

## 2018-03-18 DIAGNOSIS — R29898 Other symptoms and signs involving the musculoskeletal system: Secondary | ICD-10-CM

## 2018-03-18 DIAGNOSIS — R262 Difficulty in walking, not elsewhere classified: Secondary | ICD-10-CM

## 2018-03-18 NOTE — Therapy (Signed)
[facility] [address]  [place], [name], [address] Phone: [phone]   Fax:  [phone]  Physical Therapy Treatment  Patient Details  Name: Kristen Hansen MRN: [id number] Date of Birth: [birth year] Referring Provider: Dr. Gaynelle Arabian   Encounter Date: 03/18/2018  PT End of Session - 03/18/18 1531    Visit Number  6    Number of Visits  12    Date for PT Re-Evaluation  04/04/18    Authorization Type  Medicare    PT Start Time  1526    PT Stop Time  1630    PT Time Calculation (min)  64 min    Activity Tolerance  Patient tolerated treatment well    Behavior During Therapy  [facility] for tasks assessed/performed       Past Medical History:  Diagnosis Date  . Back pain   . Cancer (HCC)    Skin, basal cell  . Dyspnea   . Fatigue   . GERD (gastroesophageal reflux disease)   . Headache(784.0)    history of migraines  . History of kidney stones   . Hypercholesteremia   . Hypertension   . Leg swelling    Bilateral  . Occasional tremors    mild hand tremors  . Osteoarthritis   . PONV (postoperative nausea and vomiting)   . RBD (REM behavioral disorder)   . Rheumatoid arthritis(714.0)   . Scoliosis of lumbar spine   . Spinal stenosis    Multiple levels  . 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 Trigeminal neuralgia 03/30/2015  . Vision abnormalities     Past Surgical History:  Procedure Laterality Date  . ABDOMINAL HYSTERECTOMY    . AUGMENTATION MAMMAPLASTY Bilateral   . COLONOSCOPY    . JOINT REPLACEMENT     right uni knee, right hip with 2 revisions  . KIDNEY STONE SURGERY Bilateral   . KNEE ARTHROTOMY Right 02/27/2018   Procedure: RIGHT KNEE ARTHROTOMY; scar excision;  Surgeon: Gaynelle Arabian, MD;  Location: [facility];  Service: Orthopedics;  Laterality: Right;  . MOHS SURGERY    . TONSILLECTOMY    . TOTAL HIP ARTHROPLASTY    . TOTAL KNEE ARTHROPLASTY  01/22/2013   Procedure: TOTAL KNEE ARTHROPLASTY;  Surgeon: Gearlean Alf, MD;  Location: [facility];  Service: Orthopedics;  Laterality: Right;  Revision of a Right Uni Knee to a Total Knee Arthroplasty    There were no vitals filed for this visit.  Subjective Assessment - 03/18/18 1529    Subjective  doing well - goes to chiropractor tomorrow for L hip/back pain. Able to go to church this weekend.     Patient is accompained by:  Family member    Patient Stated Goals  improve walking and mobility    Currently in Pain?  Yes    Pain Score  2     Pain Location  Knee    Pain Orientation  Right    Pain Descriptors / Indicators  Aching;Sore;Tightness    Pain Type  Acute pain;Surgical pain                No data recorded       [facility] Adult PT Treatment/Exercise - 03/18/18 1532      Exercises   Exercises  Knee/Hip      Knee/Hip Exercises: Stretches   Quad Stretch  Right;3 reps;60 seconds prone with strap      Knee/Hip Exercises: Aerobic   Recumbent Bike  no resistance; 6"; full revolutions       Knee/Hip Exercises: Machines for Strengthening   Cybex Knee Flexion  B LE - 20# x 15    Cybex Leg Press  B LE - 15# x 15 knee flexion stretch at bottom      Knee/Hip Exercises: Standing   Forward Step Up  Right;15 reps;Hand Hold: 2;Step Height: 8"    Step Down  Right;15 reps;Hand Hold: 2;Step Height: 6"    Functional Squat  15 reps    Functional Squat Limitations  TRX      Modalities   Modalities  Vasopneumatic      Vasopneumatic   Number Minutes Vasopneumatic   15 minutes    Vasopnuematic Location   Knee    Vasopneumatic Pressure  High    Vasopneumatic Temperature   coldest      Manual Therapy   Manual Therapy  Soft tissue mobilization;Passive ROM    Manual therapy comments  pt hooklying    Soft tissue mobilization  STM with roller stick to R anterior and lateral thigh    Passive ROM  PROM into R knee flexion - aggressive to end range               PT Short Term Goals - 03/13/18 1604      PT SHORT TERM GOAL #1   Title   patient to be independent with initial HEP    Status  On-going      PT SHORT TERM GOAL #2   Title  patient to improve R knee PROM  0 - 90    Status  On-going        PT Long Term Goals - 03/13/18 1604      PT LONG TERM GOAL #1   Title  patient to be independent with advanced HEP    Status  On-going      PT LONG TERM GOAL #2   Title  patient to improve R knee AROM 0-100    Status  On-going      PT LONG TERM GOAL #3   Title  patient to demonstrate proper heel toe gait mechanics without evidence of instability    Status  On-going      PT LONG TERM GOAL #4   Title  patient to improve R LE strength to >/= 4+/5    Status  On-going      PT LONG TERM GOAL #5   Title  patient to demonstrate reciprocal gait pattern up/down 1 flight of stairs without evidence of instability    Status  On-going            Plan - 03/18/18 1531    Clinical Impression Statement  Kristen Hansen with subjective reports of being more aware of knee position while in a seated position as patient tendency is to extend rahter than allow for knee flexion stretch. Tolerable to all strengthening today, but with some mild L hip/back pain that will hopefully be addressed at chiro visit tomorrow. Patient making good progress towards goals.     PT Treatment/Interventions  ADLs/Self Care Home Management;Cryotherapy;Electrical Stimulation;Iontophoresis 4mg /ml Dexamethasone;Moist Heat;Ultrasound;Gait training;Stair training;Therapeutic activities;Therapeutic exercise;Balance training;Neuromuscular re-education;Patient/family education;Manual techniques;Functional mobility training;DME Instruction;Passive range of motion;Dry needling;Vasopneumatic Device;Taping    Consulted and Agree with Plan of Care  Patient       Patient will benefit from skilled therapeutic intervention in order to improve the following deficits and impairments:  Abnormal gait, Decreased activity tolerance, Decreased mobility, Decreased range of motion,  Decreased strength, Difficulty walking, Increased muscle spasms, Increased fascial restricitons, Impaired flexibility, Pain  Visit Diagnosis: Acute pain of right knee  Difficulty in walking, not elsewhere classified  Other abnormalities of gait and mobility  Other symptoms and signs involving the musculoskeletal system     Problem List Patient Active Problem List   Diagnosis Date Noted  . 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 Excessive sleepiness 10/31/2016  . Avitaminosis D 03/30/2016  . Idiopathic peripheral neuropathy 02/23/2016  . BP (high blood pressure) 02/22/2016  . Other fatigue 10/20/2015  . Trigeminal neuralgia 03/30/2015  . 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 Polypharmacy 09/11/2014  . Other long term (current) drug therapy 09/11/2014  . Arthritis or polyarthritis, rheumatoid ([place]) 05/14/2014  . Generalized OA 05/14/2014  . Rheumatoid arthritis ([place]) 05/14/2014  . Postop Hyponatremia 01/23/2013  . Postoperative anemia due to acute blood loss 01/23/2013  . Pain due to unicompartmental arthroplasty of knee ([place]) 01/22/2013  . Pain due to knee joint prosthesis ([place]) 01/22/2013  . Mucositis oral 08/20/2012    Lanney Gins, PT, DPT 03/18/18 5:16 PM   [facility] [address]  [place], [name], [address] Phone: [phone]   Fax:  [phone]  Name: Kristen Hansen MRN: [id number] Date of Birth: [birth year]

## 2018-03-20 ENCOUNTER — Ambulatory Visit: Payer: Medicare Other | Admitting: Physical Therapy

## 2018-03-20 ENCOUNTER — Encounter: Payer: Self-pay | Admitting: Physical Therapy

## 2018-03-20 DIAGNOSIS — M25561 Pain in right knee: Secondary | ICD-10-CM | POA: Diagnosis not present

## 2018-03-20 DIAGNOSIS — R262 Difficulty in walking, not elsewhere classified: Secondary | ICD-10-CM

## 2018-03-20 DIAGNOSIS — R2689 Other abnormalities of gait and mobility: Secondary | ICD-10-CM

## 2018-03-20 DIAGNOSIS — R29898 Other symptoms and signs involving the musculoskeletal system: Secondary | ICD-10-CM

## 2018-03-20 NOTE — Therapy (Signed)
[facility] [address]  [place], [name], [address] Phone: [phone]   Fax:  [phone]  Physical Therapy Treatment  Patient Details  Name: Kristen Hansen MRN: [id number] Date of Birth: [birth year] Referring Provider: Dr. Gaynelle Arabian   Encounter Date: 03/20/2018  PT End of Session - 03/20/18 1616    Visit Number  7    Number of Visits  12    Date for PT Re-Evaluation  04/04/18    Authorization Type  Medicare    PT Start Time  2947    PT Stop Time  1612    PT Time Calculation (min)  1439 min    Activity Tolerance  Patient tolerated treatment well    Behavior During Therapy  [facility] for tasks assessed/performed       Past Medical History:  Diagnosis Date  . Back pain   . Cancer (HCC)    Skin, basal cell  . Dyspnea   . Fatigue   . GERD (gastroesophageal reflux disease)   . Headache(784.0)    history of migraines  . History of kidney stones   . Hypercholesteremia   . Hypertension   . Leg swelling    Bilateral  . Occasional tremors    mild hand tremors  . Osteoarthritis   . PONV (postoperative nausea and vomiting)   . RBD (REM behavioral disorder)   . Rheumatoid arthritis(714.0)   . Scoliosis of lumbar spine   . Spinal stenosis    Multiple levels  . 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 Trigeminal neuralgia 03/30/2015  . Vision abnormalities     Past Surgical History:  Procedure Laterality Date  . ABDOMINAL HYSTERECTOMY    . AUGMENTATION MAMMAPLASTY Bilateral   . COLONOSCOPY    . JOINT REPLACEMENT     right uni knee, right hip with 2 revisions  . KIDNEY STONE SURGERY Bilateral   . KNEE ARTHROTOMY Right 02/27/2018   Procedure: RIGHT KNEE ARTHROTOMY; scar excision;  Surgeon: Gaynelle Arabian, MD;  Location: [facility];  Service: Orthopedics;  Laterality: Right;  . MOHS SURGERY    . TONSILLECTOMY    . TOTAL HIP ARTHROPLASTY    . TOTAL KNEE ARTHROPLASTY  01/22/2013   Procedure: TOTAL KNEE ARTHROPLASTY;  Surgeon: Gearlean Alf, MD;  Location: [facility];  Service: Orthopedics;  Laterality: Right;  Revision of a Right Uni Knee to a Total Knee Arthroplasty    There were no vitals filed for this visit.  Subjective Assessment - 03/20/18 1615    Subjective  doing well - still has to wear loose pants due to discomfort    Patient is accompained by:  Family member    Patient Stated Goals  improve walking and mobility    Currently in Pain?  Yes    Pain Score  1     Pain Location  Knee    Pain Orientation  Right    Pain Descriptors / Indicators  Aching;Sore    Pain Type  Acute pain;Surgical pain         [facility] PT Assessment - 03/20/18 0001      AROM   Right Knee Flexion  92      PROM   Right Knee Flexion  96            No data recorded       [facility] Adult PT Treatment/Exercise - 03/20/18 1617      Knee/Hip Exercises: Stretches   Sports administrator  Right;3 reps;60 seconds prone with strap      Knee/Hip Exercises: Aerobic   Recumbent Bike  L1 x 6 min - full revolutions - closer to improve flexion      Knee/Hip Exercises: Standing   Terminal Knee Extension  Strengthening;Right;15 reps;Theraband    Theraband Level (Terminal Knee Extension)  Level 4 (Blue)    Step Down  Right;15 reps;Hand Hold: 2;Step Height: 6"    Wall Squat  15 reps;3 seconds      Knee/Hip Exercises: Seated   Other Seated Knee/Hip Exercises  Fitter leg press (1 black/1 blue) x15 - AAROM for knee flexion on release    Hamstring Curl  Strengthening;Right;15 reps    Hamstring Limitations  green tband      Knee/Hip Exercises: Supine   Bridges  Both;15 reps    Straight Leg Raises  Strengthening;Right;15 reps 2#      Modalities   Modalities  Vasopneumatic      Vasopneumatic   Number Minutes Vasopneumatic   15 minutes    Vasopnuematic Location   Knee    Vasopneumatic Pressure  High    Vasopneumatic Temperature   coldest               PT Short Term Goals - 03/13/18 1604      PT SHORT TERM GOAL #1   Title  patient  to be independent with initial HEP    Status  On-going      PT SHORT TERM GOAL #2   Title  patient to improve R knee PROM  0 - 90    Status  On-going        PT Long Term Goals - 03/13/18 1604      PT LONG TERM GOAL #1   Title  patient to be independent with advanced HEP    Status  On-going      PT LONG TERM GOAL #2   Title  patient to improve R knee AROM 0-100    Status  On-going      PT LONG TERM GOAL #3   Title  patient to demonstrate proper heel toe gait mechanics without evidence of instability    Status  On-going      PT LONG TERM GOAL #4   Title  patient to improve R LE strength to >/= 4+/5    Status  On-going      PT LONG TERM GOAL #5   Title  patient to demonstrate reciprocal gait pattern up/down 1 flight of stairs without evidence of instability    Status  On-going            Plan - 03/20/18 1616    Clinical Impression Statement  Patient doing well - seen by chiro yesterday with good relief in back pain today. Much more aware of leg position in sitting with improved knee flexion. Good tolerance to all strengthening today with focus on quad strength. AROM of R knee now greater than 90 degress, which is much improved since initial visit. Making good progress towards goals.     PT Treatment/Interventions  ADLs/Self Care Home Management;Cryotherapy;Electrical Stimulation;Iontophoresis 4mg /ml Dexamethasone;Moist Heat;Ultrasound;Gait training;Stair training;Therapeutic activities;Therapeutic exercise;Balance training;Neuromuscular re-education;Patient/family education;Manual techniques;Functional mobility training;DME Instruction;Passive range of motion;Dry needling;Vasopneumatic Device;Taping    Consulted and Agree with Plan of Care  Patient       Patient will benefit from skilled therapeutic intervention in order to improve the following deficits and impairments:  Abnormal gait, Decreased activity tolerance, Decreased mobility, Decreased range of motion, Decreased  strength, Difficulty walking, Increased muscle spasms, Increased fascial restricitons, Impaired flexibility, Pain  Visit Diagnosis: Acute pain of right knee  Difficulty in walking, not elsewhere classified  Other abnormalities of gait and mobility  Other symptoms and signs involving the musculoskeletal system     Problem List Patient Active Problem List   Diagnosis Date Noted  . 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 Excessive sleepiness 10/31/2016  . Avitaminosis D 03/30/2016  . Idiopathic peripheral neuropathy 02/23/2016  . BP (high blood pressure) 02/22/2016  . Other fatigue 10/20/2015  . Trigeminal neuralgia 03/30/2015  . 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 Polypharmacy 09/11/2014  . Other long term (current) drug therapy 09/11/2014  . Arthritis or polyarthritis, rheumatoid ([place]) 05/14/2014  . Generalized OA 05/14/2014  . Rheumatoid arthritis ([place]) 05/14/2014  . Postop Hyponatremia 01/23/2013  . Postoperative anemia due to acute blood loss 01/23/2013  . Pain due to unicompartmental arthroplasty of knee ([place]) 01/22/2013  . Pain due to knee joint prosthesis ([place]) 01/22/2013  . Mucositis oral 08/20/2012     Lanney Gins, PT, DPT 03/20/18 5:13 PM   [facility] [address]  [place], [name], [address] Phone: [phone]   Fax:  [phone]  Name: Kristen Hansen MRN: [id number] Date of Birth: 01/24/1946

## 2018-03-22 ENCOUNTER — Ambulatory Visit: Payer: Medicare Other | Admitting: Physical Therapy

## 2018-03-22 ENCOUNTER — Encounter: Payer: Self-pay | Admitting: Physical Therapy

## 2018-03-22 DIAGNOSIS — R2689 Other abnormalities of gait and mobility: Secondary | ICD-10-CM

## 2018-03-22 DIAGNOSIS — M25561 Pain in right knee: Secondary | ICD-10-CM

## 2018-03-22 DIAGNOSIS — R29898 Other symptoms and signs involving the musculoskeletal system: Secondary | ICD-10-CM

## 2018-03-22 DIAGNOSIS — R262 Difficulty in walking, not elsewhere classified: Secondary | ICD-10-CM

## 2018-03-22 NOTE — Therapy (Signed)
[name], [name], [address] Phone: [phone]   Fax:  [phone]  Physical Therapy Treatment  Patient Details  Name: [name] MRN: [id number] Date of Birth: June 01, 1946 Referring Provider: Dr. Gaynelle Hansen   Encounter Date: 03/22/2018  PT End of Session - 03/22/18 0925    Visit Number  8    Number of Visits  12    Date for PT Re-Evaluation  04/04/18    Authorization Type  Medicare    PT Start Time  0923    PT Stop Time  1025    PT Time Calculation (min)  62 min    Activity Tolerance  Patient tolerated treatment well    Behavior During Therapy  [facility] for tasks assessed/performed       Past Medical History:  Diagnosis Date  . Back pain   . Cancer (HCC)    Skin, basal cell  . Dyspnea   . Fatigue   . GERD (gastroesophageal reflux disease)   . Headache(784.0)    history of migraines  . History of kidney stones   . Hypercholesteremia   . Hypertension   . Leg swelling    Bilateral  . Occasional tremors    mild hand tremors  . Osteoarthritis   . PONV (postoperative nausea and vomiting)   . RBD (REM behavioral disorder)   . Rheumatoid arthritis(714.0)   . Scoliosis of lumbar spine   . Spinal stenosis    Multiple levels  . 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 Trigeminal neuralgia 03/30/2015  . Vision abnormalities     Past Surgical History:  Procedure Laterality Date  . ABDOMINAL HYSTERECTOMY    . AUGMENTATION MAMMAPLASTY Bilateral   . COLONOSCOPY    . JOINT REPLACEMENT     right uni knee, right hip with 2 revisions  . KIDNEY STONE SURGERY Bilateral   . KNEE ARTHROTOMY Right 02/27/2018   Procedure: RIGHT KNEE ARTHROTOMY; scar excision;  Surgeon: Kristen Arabian, MD;  Location: [facility];  Service: Orthopedics;  Laterality: Right;  . MOHS SURGERY    . TONSILLECTOMY    . TOTAL HIP ARTHROPLASTY    . TOTAL KNEE ARTHROPLASTY  01/22/2013   Procedure: TOTAL KNEE ARTHROPLASTY;  Surgeon: Gearlean Alf, MD;  Location: [facility];  Service: Orthopedics;  Laterality: Right;  Revision of a Right Uni Knee to a Total Knee Arthroplasty    There were no vitals filed for this visit.  Subjective Assessment - 03/22/18 0922    Subjective  able to go to [employer] - very fatigued following     Patient Stated Goals  improve walking and mobility    Currently in Pain?  Yes    Pain Score  1     Pain Location  Knee    Pain Orientation  Right    Pain Descriptors / Indicators  Sore;Tightness    Pain Type  Surgical pain;Acute pain                No data recorded       [facility] Adult PT Treatment/Exercise - 03/22/18 0001      Knee/Hip Exercises: Stretches   Sports administrator  Right;3 reps;60 seconds prone with strap      Knee/Hip Exercises: Aerobic   Recumbent Bike  L1 x 6 min - full revolutions - closer to improve flexion      Knee/Hip Exercises: Machines for Strengthening   Cybex Knee Extension  B  LE - 25# x 15; B con/R ecc - 20# x 10    Cybex Knee Flexion  B LE - 25# x 15; B con/R ecc - 25# x 10      Knee/Hip Exercises: Standing   Wall Squat  15 reps;3 seconds    Other Standing Knee Exercises  B side stepping - red tband x 20 feet each direction    Other Standing Knee Exercises  Fwd monster walks - red tband 2 x 20 feet      Knee/Hip Exercises: Seated   Sit to Sand  15 reps;without UE support progressing level of full weight onto R LE      Modalities   Modalities  Vasopneumatic      Vasopneumatic   Number Minutes Vasopneumatic   15 minutes    Vasopnuematic Location   Knee    Vasopneumatic Pressure  High    Vasopneumatic Temperature   coldest               PT Short Term Goals - 03/22/18 3716      PT SHORT TERM GOAL #1   Title  patient to be independent with initial HEP    Status  Achieved      PT SHORT TERM GOAL #2   Title  patient to improve R knee PROM  0 - 90    Status  Achieved        PT Long Term Goals - 03/13/18 1604      PT LONG TERM GOAL #1    Title  patient to be independent with advanced HEP    Status  On-going      PT LONG TERM GOAL #2   Title  patient to improve R knee AROM 0-100    Status  On-going      PT LONG TERM GOAL #3   Title  patient to demonstrate proper heel toe gait mechanics without evidence of instability    Status  On-going      PT LONG TERM GOAL #4   Title  patient to improve R LE strength to >/= 4+/5    Status  On-going      PT LONG TERM GOAL #5   Title  patient to demonstrate reciprocal gait pattern up/down 1 flight of stairs without evidence of instability    Status  On-going            Plan - 03/22/18 0926    Clinical Impression Statement  Ambulating today with a much more stable gait pattern. Reports good compliance with HEP. PT session today continues to focus on knee flexion as well as general quad strength. Able to demo sit to stand with no hands + increased weight bearing through R LE - of which is great progress for patient as she has not been able to do this for some time. Making good progress towards goals.     PT Treatment/Interventions  ADLs/Self Kristen Home Management;Cryotherapy;Electrical Stimulation;Iontophoresis 4mg /ml Dexamethasone;Moist Heat;Ultrasound;Gait training;Stair training;Therapeutic activities;Therapeutic exercise;Balance training;Neuromuscular re-education;Patient/family education;Manual techniques;Functional mobility training;DME Instruction;Passive range of motion;Dry needling;Vasopneumatic Device;Taping    Consulted and Agree with Plan of Kristen  Patient       Patient will benefit from skilled therapeutic intervention in order to improve the following deficits and impairments:  Abnormal gait, Decreased activity tolerance, Decreased mobility, Decreased range of motion, Decreased strength, Difficulty walking, Increased muscle spasms, Increased fascial restricitons, Impaired flexibility, Pain  Visit Diagnosis: Acute pain of right knee  Difficulty in walking, not elsewhere  classified  Other abnormalities of gait and mobility  Other symptoms and signs involving the musculoskeletal system     Problem List Patient Active Problem List   Diagnosis Date Noted  . 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 Excessive sleepiness 10/31/2016  . Avitaminosis D 03/30/2016  . Idiopathic peripheral neuropathy 02/23/2016  . BP (high blood pressure) 02/22/2016  . Other fatigue 10/20/2015  . Trigeminal neuralgia 03/30/2015  . 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 Polypharmacy 09/11/2014  . Other long term (current) drug therapy 09/11/2014  . Arthritis or polyarthritis, rheumatoid ([place]) 05/14/2014  . Generalized OA 05/14/2014  . Rheumatoid arthritis ([place]) 05/14/2014  . Postop Hyponatremia 01/23/2013  . Postoperative anemia due to acute blood loss 01/23/2013  . Pain due to unicompartmental arthroplasty of knee ([place]) 01/22/2013  . Pain due to knee joint prosthesis ([place]) 01/22/2013  . Mucositis oral 08/20/2012     Lanney Gins, PT, DPT 03/22/18 10:41 AM   [facility] [address]  [place], [name], [address] Phone: [phone]   Fax:  [phone]  Name: Kristen Hansen MRN: [id number] Date of Birth: 12-Dec-1946

## 2018-03-25 ENCOUNTER — Encounter: Payer: Self-pay | Admitting: Physical Therapy

## 2018-03-25 ENCOUNTER — Ambulatory Visit: Payer: Medicare Other | Attending: Orthopedic Surgery | Admitting: Physical Therapy

## 2018-03-25 DIAGNOSIS — R262 Difficulty in walking, not elsewhere classified: Secondary | ICD-10-CM

## 2018-03-25 DIAGNOSIS — R2689 Other abnormalities of gait and mobility: Secondary | ICD-10-CM | POA: Insufficient documentation

## 2018-03-25 DIAGNOSIS — R29898 Other symptoms and signs involving the musculoskeletal system: Secondary | ICD-10-CM | POA: Diagnosis present

## 2018-03-25 DIAGNOSIS — M25561 Pain in right knee: Secondary | ICD-10-CM | POA: Diagnosis not present

## 2018-03-25 NOTE — Therapy (Signed)
[facility] [address]  [place], [name], [address] Phone: [phone]   Fax:  [phone]  Physical Therapy Treatment  Patient Details  Name: Kristen Hansen MRN: [id number] Date of Birth: 1946/05/27 Referring Provider: Dr. Gaynelle Arabian   Encounter Date: 03/25/2018  PT End of Session - 03/25/18 1534    Visit Number  9    Number of Visits  12    Date for PT Re-Evaluation  04/04/18    Authorization Type  Medicare    PT Start Time  1530    PT Stop Time  1633    PT Time Calculation (min)  63 min    Activity Tolerance  Patient tolerated treatment well    Behavior During Therapy  [facility] for tasks assessed/performed       Past Medical History:  Diagnosis Date  . Back pain   . Cancer (HCC)    Skin, basal cell  . Dyspnea   . Fatigue   . GERD (gastroesophageal reflux disease)   . Headache(784.0)    history of migraines  . History of kidney stones   . Hypercholesteremia   . Hypertension   . Leg swelling    Bilateral  . Occasional tremors    mild hand tremors  . Osteoarthritis   . PONV (postoperative nausea and vomiting)   . RBD (REM behavioral disorder)   . Rheumatoid arthritis(714.0)   . Scoliosis of lumbar spine   . Spinal stenosis    Multiple levels  . 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 Trigeminal neuralgia 03/30/2015  . Vision abnormalities     Past Surgical History:  Procedure Laterality Date  . ABDOMINAL HYSTERECTOMY    . AUGMENTATION MAMMAPLASTY Bilateral   . COLONOSCOPY    . JOINT REPLACEMENT     right uni knee, right hip with 2 revisions  . KIDNEY STONE SURGERY Bilateral   . KNEE ARTHROTOMY Right 02/27/2018   Procedure: RIGHT KNEE ARTHROTOMY; scar excision;  Surgeon: Gaynelle Arabian, MD;  Location: [facility];  Service: Orthopedics;  Laterality: Right;  . MOHS SURGERY    . TONSILLECTOMY    . TOTAL HIP ARTHROPLASTY    . TOTAL KNEE ARTHROPLASTY  01/22/2013   Procedure: TOTAL KNEE ARTHROPLASTY;  Surgeon: Gearlean Alf, MD;  Location: [facility];  Service: Orthopedics;  Laterality: Right;  Revision of a Right Uni Knee to a Total Knee Arthroplasty    There were no vitals filed for this visit.  Subjective Assessment - 03/25/18 1533    Subjective  doing well - differnet things hurt at differnet times (wrist, hip, back)    Patient Stated Goals  improve walking and mobility    Currently in Pain?  Yes    Pain Score  3     Pain Location  Knee    Pain Orientation  Right    Pain Descriptors / Indicators  Aching;Tightness    Pain Type  Acute pain;Surgical pain                       [facility] Adult PT Treatment/Exercise - 03/25/18 0001      Ambulation/Gait   Stairs  Yes    Stairs Assistance  6: Modified independent (Device/Increase time)    Stair Management Technique  One rail Right;Alternating pattern    Number of Stairs  -- 2 x 14    Height of Stairs  8    Gait Comments  reduced end range  quad strength needed for full knee extension - slight trendelenbug vs reduced knee flexion during descending      Knee/Hip Exercises: Stretches   Gastroc Stretch  Right;3 reps;30 seconds prostretch      Knee/Hip Exercises: Aerobic   Nustep  L5 x 6 min      Knee/Hip Exercises: Machines for Strengthening   Cybex Knee Extension  B con/R ecc - 2 x 15 - 20#    Cybex Leg Press  B LE - 20# x 15 emphasis of stretch at neutral position      Knee/Hip Exercises: Standing   Step Down  Right;15 reps;Hand Hold: 2;Step Height: 6"    Functional Squat  15 reps    Functional Squat Limitations  TRX      Modalities   Modalities  Vasopneumatic      Vasopneumatic   Number Minutes Vasopneumatic   15 minutes    Vasopnuematic Location   Knee    Vasopneumatic Pressure  High    Vasopneumatic Temperature   coldest               PT Short Term Goals - 03/22/18 6789      PT SHORT TERM GOAL #1   Title  patient to be independent with initial HEP    Status  Achieved      PT SHORT TERM GOAL #2   Title   patient to improve R knee PROM  0 - 90    Status  Achieved        PT Long Term Goals - 03/13/18 1604      PT LONG TERM GOAL #1   Title  patient to be independent with advanced HEP    Status  On-going      PT LONG TERM GOAL #2   Title  patient to improve R knee AROM 0-100    Status  On-going      PT LONG TERM GOAL #3   Title  patient to demonstrate proper heel toe gait mechanics without evidence of instability    Status  On-going      PT LONG TERM GOAL #4   Title  patient to improve R LE strength to >/= 4+/5    Status  On-going      PT LONG TERM GOAL #5   Title  patient to demonstrate reciprocal gait pattern up/down 1 flight of stairs without evidence of instability    Status  On-going            Plan - 03/25/18 1538    Clinical Impression Statement  Kristen Hansen continues to do well with all functional mobility and strengthening. Stair training today with good reciprocal pattern - however, does lack end range quad strength needed for full knee extension for ascending, as well as slight trendelenburg vs limitations from knee flexion during descending with noted lateral hip instability. Patient with good carryover of sit to stand from low mat table without UE support.     PT Treatment/Interventions  ADLs/Self Care Home Management;Cryotherapy;Electrical Stimulation;Iontophoresis 4mg /ml Dexamethasone;Moist Heat;Ultrasound;Gait training;Stair training;Therapeutic activities;Therapeutic exercise;Balance training;Neuromuscular re-education;Patient/family education;Manual techniques;Functional mobility training;DME Instruction;Passive range of motion;Dry needling;Vasopneumatic Device;Taping    Consulted and Agree with Plan of Care  Patient       Patient will benefit from skilled therapeutic intervention in order to improve the following deficits and impairments:  Abnormal gait, Decreased activity tolerance, Decreased mobility, Decreased range of motion, Decreased strength, Difficulty  walking, Increased muscle spasms, Increased fascial restricitons, Impaired flexibility, Pain  Visit  Diagnosis: Acute pain of right knee  Difficulty in walking, not elsewhere classified  Other abnormalities of gait and mobility  Other symptoms and signs involving the musculoskeletal system     Problem List Patient Active Problem List   Diagnosis Date Noted  . 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 Excessive sleepiness 10/31/2016  . Avitaminosis D 03/30/2016  . Idiopathic peripheral neuropathy 02/23/2016  . BP (high blood pressure) 02/22/2016  . Other fatigue 10/20/2015  . Trigeminal neuralgia 03/30/2015  . 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 Polypharmacy 09/11/2014  . Other long term (current) drug therapy 09/11/2014  . Arthritis or polyarthritis, rheumatoid ([place]) 05/14/2014  . Generalized OA 05/14/2014  . Rheumatoid arthritis ([place]) 05/14/2014  . Postop Hyponatremia 01/23/2013  . Postoperative anemia due to acute blood loss 01/23/2013  . Pain due to unicompartmental arthroplasty of knee ([place]) 01/22/2013  . Pain due to knee joint prosthesis ([place]) 01/22/2013  . Mucositis oral 08/20/2012     Lanney Gins, PT, DPT 03/25/18 5:15 PM   [facility] [address]  [place], [name], [address] Phone: [phone]   Fax:  [phone]  Name: Kristen Hansen MRN: [id number] Date of Birth: May 06, 1946

## 2018-03-27 ENCOUNTER — Encounter: Payer: Self-pay | Admitting: Physical Therapy

## 2018-03-27 ENCOUNTER — Ambulatory Visit: Payer: Medicare Other | Admitting: Physical Therapy

## 2018-03-27 DIAGNOSIS — R2689 Other abnormalities of gait and mobility: Secondary | ICD-10-CM

## 2018-03-27 DIAGNOSIS — M25561 Pain in right knee: Secondary | ICD-10-CM

## 2018-03-27 DIAGNOSIS — R29898 Other symptoms and signs involving the musculoskeletal system: Secondary | ICD-10-CM

## 2018-03-27 DIAGNOSIS — R262 Difficulty in walking, not elsewhere classified: Secondary | ICD-10-CM

## 2018-03-27 NOTE — Therapy (Signed)
[facility] [address]  [place], [name], [address] Phone: [phone]   Fax:  [phone]  Physical Therapy Treatment  Patient Details  Name: Kristen Hansen MRN: [id number] Date of Birth: 1946/04/10 Referring Provider: Dr. Gaynelle Arabian   Encounter Date: 03/27/2018  PT End of Session - 03/27/18 1448    Visit Number  10    Number of Visits  12    Date for PT Re-Evaluation  04/04/18    Authorization Type  Medicare    PT Start Time  5638    PT Stop Time  1545    PT Time Calculation (min)  62 min    Activity Tolerance  Patient tolerated treatment well    Behavior During Therapy  [facility] for tasks assessed/performed       Past Medical History:  Diagnosis Date  . Back pain   . Cancer (HCC)    Skin, basal cell  . Dyspnea   . Fatigue   . GERD (gastroesophageal reflux disease)   . Headache(784.0)    history of migraines  . History of kidney stones   . Hypercholesteremia   . Hypertension   . Leg swelling    Bilateral  . Occasional tremors    mild hand tremors  . Osteoarthritis   . PONV (postoperative nausea and vomiting)   . RBD (REM behavioral disorder)   . Rheumatoid arthritis(714.0)   . Scoliosis of lumbar spine   . Spinal stenosis    Multiple levels  . 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 Trigeminal neuralgia 03/30/2015  . Vision abnormalities     Past Surgical History:  Procedure Laterality Date  . ABDOMINAL HYSTERECTOMY    . AUGMENTATION MAMMAPLASTY Bilateral   . COLONOSCOPY    . JOINT REPLACEMENT     right uni knee, right hip with 2 revisions  . KIDNEY STONE SURGERY Bilateral   . KNEE ARTHROTOMY Right 02/27/2018   Procedure: RIGHT KNEE ARTHROTOMY; scar excision;  Surgeon: Gaynelle Arabian, MD;  Location: [facility];  Service: Orthopedics;  Laterality: Right;  . MOHS SURGERY    . TONSILLECTOMY    . TOTAL HIP ARTHROPLASTY    . TOTAL KNEE ARTHROPLASTY  01/22/2013   Procedure: TOTAL KNEE ARTHROPLASTY;  Surgeon: Gearlean Alf, MD;  Location: [facility];  Service: Orthopedics;  Laterality: Right;  Revision of a Right Uni Knee to a Total Knee Arthroplasty    There were no vitals filed for this visit.  Subjective Assessment - 03/27/18 1447    Subjective  able to get up from dining room chair x 3 - motivated    Patient is accompained by:  Family member    Patient Stated Goals  improve walking and mobility    Currently in Pain?  Yes    Pain Score  2     Pain Location  Knee    Pain Orientation  Right    Pain Descriptors / Indicators  Aching;Sore    Pain Type  Acute pain;Surgical pain         [facility] PT Assessment - 03/27/18 0001      AROM   Right Knee Flexion  99      PROM   Right Knee Flexion  104                   [facility] Adult PT Treatment/Exercise - 03/27/18 0001      Knee/Hip Exercises: Aerobic   Recumbent Bike  L1 x  6 min - full revolutions - closer to improve flexion      Knee/Hip Exercises: Machines for Strengthening   Cybex Knee Extension  B con/R ecc  x 15 - 20#    Cybex Knee Flexion  B LE - 20# x 15      Knee/Hip Exercises: Standing   Other Standing Knee Exercises  step up and over - 4 inch step x 10; step up and over 4" step + AirEx x 10 - intermittent UE support    Other Standing Knee Exercises  B side stepping - red tband at ankles x 25 feet each direction      Knee/Hip Exercises: Seated   Long Arc Quad  Strengthening;Right;15 reps;Weights    Long Arc Quad Weight  3 lbs.      Modalities   Modalities  Vasopneumatic      Vasopneumatic   Number Minutes Vasopneumatic   15 minutes    Vasopnuematic Location   Knee    Vasopneumatic Pressure  High    Vasopneumatic Temperature   coldest      Manual Therapy   Manual Therapy  Joint mobilization;Passive ROM    Joint Mobilization  seat belt mobs - R knee    Passive ROM  PROM into R knee flexion - aggressive to end range               PT Short Term Goals - 03/22/18 1610      PT SHORT TERM GOAL #1   Title   patient to be independent with initial HEP    Status  Achieved      PT SHORT TERM GOAL #2   Title  patient to improve R knee PROM  0 - 90    Status  Achieved        PT Long Term Goals - 03/13/18 1604      PT LONG TERM GOAL #1   Title  patient to be independent with advanced HEP    Status  On-going      PT LONG TERM GOAL #2   Title  patient to improve R knee AROM 0-100    Status  On-going      PT LONG TERM GOAL #3   Title  patient to demonstrate proper heel toe gait mechanics without evidence of instability    Status  On-going      PT LONG TERM GOAL #4   Title  patient to improve R LE strength to >/= 4+/5    Status  On-going      PT LONG TERM GOAL #5   Title  patient to demonstrate reciprocal gait pattern up/down 1 flight of stairs without evidence of instability    Status  On-going            Plan - 03/27/18 1449    Clinical Impression Statement  Continues to make excellent progress with PROM/AROM - patient feeling much more motivated regarding current progress as she can now see her progress daily. Doing well with all strengthening work with no issue. Making good progress towards goals.     PT Treatment/Interventions  ADLs/Self Care Home Management;Cryotherapy;Electrical Stimulation;Iontophoresis 4mg /ml Dexamethasone;Moist Heat;Ultrasound;Gait training;Stair training;Therapeutic activities;Therapeutic exercise;Balance training;Neuromuscular re-education;Patient/family education;Manual techniques;Functional mobility training;DME Instruction;Passive range of motion;Dry needling;Vasopneumatic Device;Taping    Consulted and Agree with Plan of Care  Patient       Patient will benefit from skilled therapeutic intervention in order to improve the following deficits and impairments:  Abnormal gait, Decreased activity tolerance, Decreased  mobility, Decreased range of motion, Decreased strength, Difficulty walking, Increased muscle spasms, Increased fascial restricitons, Impaired  flexibility, Pain  Visit Diagnosis: Acute pain of right knee  Difficulty in walking, not elsewhere classified  Other abnormalities of gait and mobility  Other symptoms and signs involving the musculoskeletal system     Problem List Patient Active Problem List   Diagnosis Date Noted  . 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 Excessive sleepiness 10/31/2016  . Avitaminosis D 03/30/2016  . Idiopathic peripheral neuropathy 02/23/2016  . BP (high blood pressure) 02/22/2016  . Other fatigue 10/20/2015  . Trigeminal neuralgia 03/30/2015  . 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 Polypharmacy 09/11/2014  . Other long term (current) drug therapy 09/11/2014  . Arthritis or polyarthritis, rheumatoid ([place]) 05/14/2014  . Generalized OA 05/14/2014  . Rheumatoid arthritis ([place]) 05/14/2014  . Postop Hyponatremia 01/23/2013  . Postoperative anemia due to acute blood loss 01/23/2013  . Pain due to unicompartmental arthroplasty of knee ([place]) 01/22/2013  . Pain due to knee joint prosthesis ([place]) 01/22/2013  . Mucositis oral 08/20/2012     Lanney Gins, PT, DPT 03/27/18 4:24 PM   [facility] [address]  [place], [name], [address] Phone: [phone]   Fax:  [phone]  Name: LAMANDA RUDDER MRN: [id number] Date of Birth: Dec 03, 1946

## 2018-03-29 ENCOUNTER — Encounter: Payer: Self-pay | Admitting: Physical Therapy

## 2018-03-29 ENCOUNTER — Ambulatory Visit: Payer: Medicare Other | Admitting: Physical Therapy

## 2018-03-29 DIAGNOSIS — M25561 Pain in right knee: Secondary | ICD-10-CM

## 2018-03-29 DIAGNOSIS — R262 Difficulty in walking, not elsewhere classified: Secondary | ICD-10-CM

## 2018-03-29 DIAGNOSIS — R2689 Other abnormalities of gait and mobility: Secondary | ICD-10-CM

## 2018-03-29 DIAGNOSIS — R29898 Other symptoms and signs involving the musculoskeletal system: Secondary | ICD-10-CM

## 2018-03-29 NOTE — Therapy (Signed)
[facility] [address]  [place], [name], [address] Phone: [phone]   Fax:  [phone]  Physical Therapy Treatment  Patient Details  Name: Kristen Hansen MRN: [id number] Date of Birth: [birth year] Referring Provider: Dr. Gaynelle Arabian   Encounter Date: 03/29/2018  PT End of Session - 03/29/18 0936    Visit Number  11    Number of Visits  20    Date for PT Re-Evaluation  05/03/18    Authorization Type  Medicare    PT Start Time  0932    PT Stop Time  1033    PT Time Calculation (min)  61 min    Activity Tolerance  Patient tolerated treatment well    Behavior During Therapy  [facility] for tasks assessed/performed       Past Medical History:  Diagnosis Date  . Back pain   . Cancer (HCC)    Skin, basal cell  . Dyspnea   . Fatigue   . GERD (gastroesophageal reflux disease)   . Headache(784.0)    history of migraines  . History of kidney stones   . Hypercholesteremia   . Hypertension   . Leg swelling    Bilateral  . Occasional tremors    mild hand tremors  . Osteoarthritis   . PONV (postoperative nausea and vomiting)   . RBD (REM behavioral disorder)   . Rheumatoid arthritis(714.0)   . Scoliosis of lumbar spine   . Spinal stenosis    Multiple levels  . 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 Trigeminal neuralgia 03/30/2015  . Vision abnormalities     Past Surgical History:  Procedure Laterality Date  . ABDOMINAL HYSTERECTOMY    . AUGMENTATION MAMMAPLASTY Bilateral   . COLONOSCOPY    . JOINT REPLACEMENT     right uni knee, right hip with 2 revisions  . KIDNEY STONE SURGERY Bilateral   . KNEE ARTHROTOMY Right 02/27/2018   Procedure: RIGHT KNEE ARTHROTOMY; scar excision;  Surgeon: Gaynelle Arabian, MD;  Location: [facility];  Service: Orthopedics;  Laterality: Right;  . MOHS SURGERY    . TONSILLECTOMY    . TOTAL HIP ARTHROPLASTY    . TOTAL KNEE ARTHROPLASTY  01/22/2013   Procedure: TOTAL KNEE ARTHROPLASTY;  Surgeon: Gearlean Alf, MD;  Location: [facility];  Service: Orthopedics;  Laterality: Right;  Revision of a Right Uni Knee to a Total Knee Arthroplasty    There were no vitals filed for this visit.  Subjective Assessment - 03/29/18 0934    Subjective  feels like she is having a bit of an arthritis flare up today    Patient Stated Goals  improve walking and mobility    Currently in Pain?  Yes    Pain Score  4     Pain Location  Knee    Pain Orientation  Right    Pain Descriptors / Indicators  Aching;Sore;Tightness    Pain Type  Acute pain;Surgical pain         [facility] PT Assessment - 03/29/18 0001      AROM   Right Knee Extension  0    Right Knee Flexion  99      PROM   Right Knee Extension  0    Right Knee Flexion  104                   [facility] Adult PT Treatment/Exercise - 03/29/18 0937      Knee/Hip Exercises: Aerobic  Recumbent Bike  L2 x 6 min      Knee/Hip Exercises: Machines for Strengthening   Cybex Knee Flexion  B LE - 35# x 15    Cybex Leg Press  B LE - 25# x 15      Knee/Hip Exercises: Seated   Long Arc Quad  Strengthening;Right;15 reps;Weights    Long Arc Quad Weight  3 lbs.    Other Seated Knee/Hip Exercises  Fitter - R LE - 2 blue/1 black x 15 reps      Modalities   Modalities  Vasopneumatic      Vasopneumatic   Number Minutes Vasopneumatic   15 minutes    Vasopnuematic Location   Knee    Vasopneumatic Pressure  High    Vasopneumatic Temperature   coldest      Manual Therapy   Manual Therapy  Joint mobilization;Passive ROM    Joint Mobilization  patellar mobs - all planes    Passive ROM  PROM into R knee flexion - aggressive to end range               PT Short Term Goals - 03/22/18 4081      PT SHORT TERM GOAL #1   Title  patient to be independent with initial HEP    Status  Achieved      PT SHORT TERM GOAL #2   Title  patient to improve R knee PROM  0 - 90    Status  Achieved        PT Long Term Goals - 03/29/18 0936      PT LONG  TERM GOAL #1   Title  patient to be independent with advanced HEP    Status  On-going      PT LONG TERM GOAL #2   Title  patient to improve R knee AROM 0-100    Status  On-going      PT LONG TERM GOAL #3   Title  patient to demonstrate proper heel toe gait mechanics without evidence of instability    Status  On-going      PT LONG TERM GOAL #4   Title  patient to improve R LE strength to >/= 4+/5    Status  On-going      PT LONG TERM GOAL #5   Title  patient to demonstrate reciprocal gait pattern up/down 1 flight of stairs without evidence of instability    Status  On-going            Plan - 03/29/18 0937    Clinical Impression Statement  Kristen Hansen has made good progress with AROM, strength, and gait since initial visit. However, does still lack adequate AROM at R knee for stair navigation, as well as noted gait instability likely related to deficits in end range quad strength. Patient to continue to benefit from skilled PT intervention to progress functional mobility.     PT Frequency  2x / week    PT Duration  4 weeks    PT Treatment/Interventions  ADLs/Self Care Home Management;Cryotherapy;Electrical Stimulation;Iontophoresis 4mg /ml Dexamethasone;Moist Heat;Ultrasound;Gait training;Stair training;Therapeutic activities;Therapeutic exercise;Balance training;Neuromuscular re-education;Patient/family education;Manual techniques;Functional mobility training;DME Instruction;Passive range of motion;Dry needling;Vasopneumatic Device;Taping    Consulted and Agree with Plan of Care  Patient       Patient will benefit from skilled therapeutic intervention in order to improve the following deficits and impairments:  Abnormal gait, Decreased activity tolerance, Decreased mobility, Decreased range of motion, Decreased strength, Difficulty walking, Increased muscle spasms, Increased fascial restricitons,  Impaired flexibility, Pain  Visit Diagnosis: Acute pain of right knee  Difficulty in  walking, not elsewhere classified  Other abnormalities of gait and mobility  Other symptoms and signs involving the musculoskeletal system     Problem List Patient Active Problem List   Diagnosis Date Noted  . 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 Excessive sleepiness 10/31/2016  . Avitaminosis D 03/30/2016  . Idiopathic peripheral neuropathy 02/23/2016  . BP (high blood pressure) 02/22/2016  . Other fatigue 10/20/2015  . Trigeminal neuralgia 03/30/2015  . 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 Polypharmacy 09/11/2014  . Other long term (current) drug therapy 09/11/2014  . Arthritis or polyarthritis, rheumatoid ([place]) 05/14/2014  . Generalized OA 05/14/2014  . Rheumatoid arthritis ([place]) 05/14/2014  . Postop Hyponatremia 01/23/2013  . Postoperative anemia due to acute blood loss 01/23/2013  . Pain due to unicompartmental arthroplasty of knee ([place]) 01/22/2013  . Pain due to knee joint prosthesis ([place]) 01/22/2013  . Mucositis oral 08/20/2012     Lanney Gins, PT, DPT 03/29/18 11:22 AM   [facility] [address]  [place], [name], [address] Phone: [phone]   Fax:  [phone]  Name: Kristen Hansen MRN: [id number] Date of Birth: Oct 04, 1946

## 2018-04-01 ENCOUNTER — Ambulatory Visit: Payer: Medicare Other | Admitting: Physical Therapy

## 2018-04-01 ENCOUNTER — Encounter: Payer: Self-pay | Admitting: Physical Therapy

## 2018-04-01 DIAGNOSIS — R2689 Other abnormalities of gait and mobility: Secondary | ICD-10-CM

## 2018-04-01 DIAGNOSIS — R262 Difficulty in walking, not elsewhere classified: Secondary | ICD-10-CM

## 2018-04-01 DIAGNOSIS — M25561 Pain in right knee: Secondary | ICD-10-CM

## 2018-04-01 DIAGNOSIS — R29898 Other symptoms and signs involving the musculoskeletal system: Secondary | ICD-10-CM

## 2018-04-01 NOTE — Therapy (Signed)
[facility] [address]  [place], [name], [address] Phone: [phone]   Fax:  [phone]  Physical Therapy Treatment  Patient Details  Name: Kristen Hansen MRN: [id number] Date of Birth: 1946-08-22 Referring Provider: Dr. Gaynelle Arabian   Encounter Date: 04/01/2018  PT End of Session - 04/01/18 1622    Visit Number  12    Number of Visits  20    Date for PT Re-Evaluation  05/03/18    Authorization Type  Medicare    PT Start Time  1615    PT Stop Time  1714    PT Time Calculation (min)  59 min    Activity Tolerance  Patient tolerated treatment well    Behavior During Therapy  [facility] for tasks assessed/performed       Past Medical History:  Diagnosis Date  . Back pain   . Cancer (HCC)    Skin, basal cell  . Dyspnea   . Fatigue   . GERD (gastroesophageal reflux disease)   . Headache(784.0)    history of migraines  . History of kidney stones   . Hypercholesteremia   . Hypertension   . Leg swelling    Bilateral  . Occasional tremors    mild hand tremors  . Osteoarthritis   . PONV (postoperative nausea and vomiting)   . RBD (REM behavioral disorder)   . Rheumatoid arthritis(714.0)   . Scoliosis of lumbar spine   . Spinal stenosis    Multiple levels  . 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 Trigeminal neuralgia 03/30/2015  . Vision abnormalities     Past Surgical History:  Procedure Laterality Date  . ABDOMINAL HYSTERECTOMY    . AUGMENTATION MAMMAPLASTY Bilateral   . COLONOSCOPY    . JOINT REPLACEMENT     right uni knee, right hip with 2 revisions  . KIDNEY STONE SURGERY Bilateral   . KNEE ARTHROTOMY Right 02/27/2018   Procedure: RIGHT KNEE ARTHROTOMY; scar excision;  Surgeon: Gaynelle Arabian, MD;  Location: [facility];  Service: Orthopedics;  Laterality: Right;  . MOHS SURGERY    . TONSILLECTOMY    . TOTAL HIP ARTHROPLASTY    . TOTAL KNEE ARTHROPLASTY  01/22/2013   Procedure: TOTAL KNEE ARTHROPLASTY;  Surgeon: Gearlean Alf, MD;  Location: [facility];  Service: Orthopedics;  Laterality: Right;  Revision of a Right Uni Knee to a Total Knee Arthroplasty    There were no vitals filed for this visit.  Subjective Assessment - 04/01/18 1617    Subjective  doing well - no new complaints    Patient Stated Goals  improve walking and mobility    Currently in Pain?  Yes    Pain Score  2     Pain Location  Knee    Pain Orientation  Right    Pain Descriptors / Indicators  Aching;Sore    Pain Type  Acute pain;Surgical pain                       [facility] Adult PT Treatment/Exercise - 04/01/18 1623      Knee/Hip Exercises: Aerobic   Recumbent Bike  L2 x 6 min      Knee/Hip Exercises: Machines for Strengthening   Cybex Knee Extension  B con/R ecc  x 15 - 25#      Knee/Hip Exercises: Standing   Heel Raises  Both;2 sets;15 reps negative; 2nd set B con/R ecc    Hip  Flexion  Stengthening;Right;15 reps;Knee straight red tband - 2 pole A    Hip Abduction  Stengthening;Right;15 reps;Knee straight red tband - 2 pole A    Hip Extension  Stengthening;Right;Knee straight red tband - 2 pole A    Other Standing Knee Exercises  BOSU (down) - weight shifting x 10; mini squat x 10 - B UE support from PT      Knee/Hip Exercises: Seated   Other Seated Knee/Hip Exercises  Fitter - R LE - 2 blue/1 black x 15 reps      Modalities   Modalities  Vasopneumatic      Vasopneumatic   Number Minutes Vasopneumatic   15 minutes    Vasopnuematic Location   Knee    Vasopneumatic Pressure  High    Vasopneumatic Temperature   coldest               PT Short Term Goals - 03/22/18 5176      PT SHORT TERM GOAL #1   Title  patient to be independent with initial HEP    Status  Achieved      PT SHORT TERM GOAL #2   Title  patient to improve R knee PROM  0 - 90    Status  Achieved        PT Long Term Goals - 03/29/18 0936      PT LONG TERM GOAL #1   Title  patient to be independent with advanced HEP     Status  On-going      PT LONG TERM GOAL #2   Title  patient to improve R knee AROM 0-100    Status  On-going      PT LONG TERM GOAL #3   Title  patient to demonstrate proper heel toe gait mechanics without evidence of instability    Status  On-going      PT LONG TERM GOAL #4   Title  patient to improve R LE strength to >/= 4+/5    Status  On-going      PT LONG TERM GOAL #5   Title  patient to demonstrate reciprocal gait pattern up/down 1 flight of stairs without evidence of instability    Status  On-going            Plan - 04/01/18 1623    Clinical Impression Statement  Patient doing ewll today - intermittent pain continues at knee, hip and back. Patient doing well today with all strengthening. Incorporated balance/strength on [place] today with patient requiring B UE support from PT to complete as she demonstrates poor control on unstready surfaces. Will continue to progress towards goals.     PT Treatment/Interventions  ADLs/Self Care Home Management;Cryotherapy;Electrical Stimulation;Iontophoresis 4mg /ml Dexamethasone;Moist Heat;Ultrasound;Gait training;Stair training;Therapeutic activities;Therapeutic exercise;Balance training;Neuromuscular re-education;Patient/family education;Manual techniques;Functional mobility training;DME Instruction;Passive range of motion;Dry needling;Vasopneumatic Device;Taping    Consulted and Agree with Plan of Care  Patient       Patient will benefit from skilled therapeutic intervention in order to improve the following deficits and impairments:  Abnormal gait, Decreased activity tolerance, Decreased mobility, Decreased range of motion, Decreased strength, Difficulty walking, Increased muscle spasms, Increased fascial restricitons, Impaired flexibility, Pain  Visit Diagnosis: Acute pain of right knee  Difficulty in walking, not elsewhere classified  Other abnormalities of gait and mobility  Other symptoms and signs involving the musculoskeletal  system     Problem List Patient Active Problem List   Diagnosis Date Noted  . 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 Excessive sleepiness 10/31/2016  . Avitaminosis D 03/30/2016  . Idiopathic peripheral neuropathy 02/23/2016  . BP (high blood pressure) 02/22/2016  . Other fatigue 10/20/2015  . Trigeminal neuralgia 03/30/2015  . 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 Polypharmacy 09/11/2014  . Other long term (current) drug therapy 09/11/2014  . Arthritis or polyarthritis, rheumatoid ([place]) 05/14/2014  . Generalized OA 05/14/2014  . Rheumatoid arthritis ([place]) 05/14/2014  . Postop Hyponatremia 01/23/2013  . Postoperative anemia due to acute blood loss 01/23/2013  . Pain due to unicompartmental arthroplasty of knee ([place]) 01/22/2013  . Pain due to knee joint prosthesis ([place]) 01/22/2013  . Mucositis oral 08/20/2012     Lanney Gins, PT, DPT 04/01/18 5:43 PM   [facility] [address]  [place], [name], [address] Phone: [phone]   Fax:  [phone]  Name: AARION KITTRELL MRN: [id number] Date of Birth: 08-Dec-1946

## 2018-04-05 ENCOUNTER — Encounter: Payer: Self-pay | Admitting: Physical Therapy

## 2018-04-05 ENCOUNTER — Ambulatory Visit: Payer: Medicare Other | Admitting: Physical Therapy

## 2018-04-05 DIAGNOSIS — R29898 Other symptoms and signs involving the musculoskeletal system: Secondary | ICD-10-CM

## 2018-04-05 DIAGNOSIS — R262 Difficulty in walking, not elsewhere classified: Secondary | ICD-10-CM

## 2018-04-05 DIAGNOSIS — M25561 Pain in right knee: Secondary | ICD-10-CM

## 2018-04-05 DIAGNOSIS — R2689 Other abnormalities of gait and mobility: Secondary | ICD-10-CM

## 2018-04-05 NOTE — Therapy (Signed)
[facility] [address]  [place], [name], [address] Phone: [phone]   Fax:  [phone]  Physical Therapy Treatment  Patient Details  Name: Kristen Hansen MRN: [id number] Date of Birth: [birth year] Referring Provider: Dr. Gaynelle Arabian   Encounter Date: 04/05/2018  PT End of Session - 04/05/18 1057    Visit Number  13    Number of Visits  20    Date for PT Re-Evaluation  05/03/18    Authorization Type  Medicare    PT Start Time  1054    PT Stop Time  1156    PT Time Calculation (min)  62 min    Activity Tolerance  Patient tolerated treatment well    Behavior During Therapy  [facility] for tasks assessed/performed       Past Medical History:  Diagnosis Date  . Back pain   . Cancer (HCC)    Skin, basal cell  . Dyspnea   . Fatigue   . GERD (gastroesophageal reflux disease)   . Headache(784.0)    history of migraines  . History of kidney stones   . Hypercholesteremia   . Hypertension   . Leg swelling    Bilateral  . Occasional tremors    mild hand tremors  . Osteoarthritis   . PONV (postoperative nausea and vomiting)   . RBD (REM behavioral disorder)   . Rheumatoid arthritis(714.0)   . Scoliosis of lumbar spine   . Spinal stenosis    Multiple levels  . 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 Trigeminal neuralgia 03/30/2015  . Vision abnormalities     Past Surgical History:  Procedure Laterality Date  . ABDOMINAL HYSTERECTOMY    . AUGMENTATION MAMMAPLASTY Bilateral   . COLONOSCOPY    . JOINT REPLACEMENT     right uni knee, right hip with 2 revisions  . KIDNEY STONE SURGERY Bilateral   . KNEE ARTHROTOMY Right 02/27/2018   Procedure: RIGHT KNEE ARTHROTOMY; scar excision;  Surgeon: Gaynelle Arabian, MD;  Location: [facility];  Service: Orthopedics;  Laterality: Right;  . MOHS SURGERY    . TONSILLECTOMY    . TOTAL HIP ARTHROPLASTY    . TOTAL KNEE ARTHROPLASTY  01/22/2013   Procedure: TOTAL KNEE ARTHROPLASTY;  Surgeon: Gearlean Alf, MD;  Location: [facility];  Service: Orthopedics;  Laterality: Right;  Revision of a Right Uni Knee to a Total Knee Arthroplasty    There were no vitals filed for this visit.  Subjective Assessment - 04/05/18 1056    Subjective  sees MD today - feels like motion is less due to weather    Patient Stated Goals  improve walking and mobility    Currently in Pain?  Yes    Pain Score  1     Pain Location  Knee    Pain Orientation  Right    Pain Descriptors / Indicators  Aching    Pain Type  Acute pain;Surgical pain         [facility] PT Assessment - 04/05/18 0001      AROM   Right Knee Extension  0    Right Knee Flexion  100      PROM   Right Knee Extension  0    Right Knee Flexion  106                   [facility] Adult PT Treatment/Exercise - 04/05/18 1058      Knee/Hip Exercises: Aerobic  Recumbent Bike  L2 x 6 min      Knee/Hip Exercises: Standing   Step Down  Right;15 reps;Hand Hold: 2;Step Height: 6";3 sets    Other Standing Knee Exercises  step up and over 8" step with R LE x 15 reps - sinlge UE to no UE support from PT      Modalities   Modalities  Vasopneumatic      Vasopneumatic   Number Minutes Vasopneumatic   15 minutes    Vasopnuematic Location   Knee    Vasopneumatic Pressure  High    Vasopneumatic Temperature   coldest      Manual Therapy   Manual Therapy  Joint mobilization;Passive ROM    Joint Mobilization  seat belt mobs for increased knee flexion    Passive ROM  PROM into R knee flexion - aggressive to end range - with prolonged stretch               PT Short Term Goals - 03/22/18 0926      PT SHORT TERM GOAL #1   Title  patient to be independent with initial HEP    Status  Achieved      PT SHORT TERM GOAL #2   Title  patient to improve R knee PROM  0 - 90    Status  Achieved        PT Long Term Goals - 03/29/18 0936      PT LONG TERM GOAL #1   Title  patient to be independent with advanced HEP    Status  On-going       PT LONG TERM GOAL #2   Title  patient to improve R knee AROM 0-100    Status  On-going      PT LONG TERM GOAL #3   Title  patient to demonstrate proper heel toe gait mechanics without evidence of instability    Status  On-going      PT LONG TERM GOAL #4   Title  patient to improve R LE strength to >/= 4+/5    Status  On-going      PT LONG TERM GOAL #5   Title  patient to demonstrate reciprocal gait pattern up/down 1 flight of stairs without evidence of instability    Status  On-going            Plan - 04/05/18 1058    Clinical Impression Statement  Kristen Hansen with reports of stiffness today - weather related. Able to progress PROM to 106 degrees with prolonged stretch at end range. patient making excellent progress with step up/over 8" step with very little to no UE support this visit. Patient to see MD this afternoon with hopeful release to driving. Making good progress.     PT Treatment/Interventions  ADLs/Self Care Home Management;Cryotherapy;Electrical Stimulation;Iontophoresis 4mg /ml Dexamethasone;Moist Heat;Ultrasound;Gait training;Stair training;Therapeutic activities;Therapeutic exercise;Balance training;Neuromuscular re-education;Patient/family education;Manual techniques;Functional mobility training;DME Instruction;Passive range of motion;Dry needling;Vasopneumatic Device;Taping    Consulted and Agree with Plan of Care  Patient       Patient will benefit from skilled therapeutic intervention in order to improve the following deficits and impairments:  Abnormal gait, Decreased activity tolerance, Decreased mobility, Decreased range of motion, Decreased strength, Difficulty walking, Increased muscle spasms, Increased fascial restricitons, Impaired flexibility, Pain  Visit Diagnosis: Acute pain of right knee  Difficulty in walking, not elsewhere classified  Other abnormalities of gait and mobility  Other symptoms and signs involving the musculoskeletal  system     Problem List  Patient Active Problem List   Diagnosis Date Noted  . 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 Excessive sleepiness 10/31/2016  . Avitaminosis D 03/30/2016  . Idiopathic peripheral neuropathy 02/23/2016  . BP (high blood pressure) 02/22/2016  . Other fatigue 10/20/2015  . Trigeminal neuralgia 03/30/2015  . 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 Polypharmacy 09/11/2014  . Other long term (current) drug therapy 09/11/2014  . Arthritis or polyarthritis, rheumatoid ([place]) 05/14/2014  . Generalized OA 05/14/2014  . Rheumatoid arthritis ([place]) 05/14/2014  . Postop Hyponatremia 01/23/2013  . Postoperative anemia due to acute blood loss 01/23/2013  . Pain due to unicompartmental arthroplasty of knee ([place]) 01/22/2013  . Pain due to knee joint prosthesis ([place]) 01/22/2013  . Mucositis oral 08/20/2012     Lanney Gins, PT, DPT 04/05/18 12:35 PM   [facility] [address]  [place], [name], [address] Phone: [phone]   Fax:  [phone]  Name: Kristen Hansen MRN: [id number] Date of Birth: 05/20/1946

## 2018-04-10 ENCOUNTER — Encounter: Payer: Self-pay | Admitting: Physical Therapy

## 2018-04-10 ENCOUNTER — Ambulatory Visit: Payer: Medicare Other | Admitting: Physical Therapy

## 2018-04-10 DIAGNOSIS — R2689 Other abnormalities of gait and mobility: Secondary | ICD-10-CM

## 2018-04-10 DIAGNOSIS — M25561 Pain in right knee: Secondary | ICD-10-CM

## 2018-04-10 DIAGNOSIS — R29898 Other symptoms and signs involving the musculoskeletal system: Secondary | ICD-10-CM

## 2018-04-10 DIAGNOSIS — R262 Difficulty in walking, not elsewhere classified: Secondary | ICD-10-CM

## 2018-04-10 NOTE — Therapy (Signed)
[facility] [address]  [place], [name], [address] Phone: [phone]   Fax:  [phone]  Physical Therapy Treatment  Patient Details  Name: Kristen Hansen MRN: [id number] Date of Birth: 13-Apr-1946 Referring Provider: Dr. Gaynelle Arabian   Encounter Date: 04/10/2018  PT End of Session - 04/10/18 0852    Visit Number  14    Number of Visits  20    Date for PT Re-Evaluation  05/03/18    Authorization Type  Medicare    PT Start Time  0840    PT Stop Time  0929    PT Time Calculation (min)  49 min    Activity Tolerance  Patient tolerated treatment well    Behavior During Therapy  [facility] for tasks assessed/performed       Past Medical History:  Diagnosis Date  . Back pain   . Cancer (HCC)    Skin, basal cell  . Dyspnea   . Fatigue   . GERD (gastroesophageal reflux disease)   . Headache(784.0)    history of migraines  . History of kidney stones   . Hypercholesteremia   . Hypertension   . Leg swelling    Bilateral  . Occasional tremors    mild hand tremors  . Osteoarthritis   . PONV (postoperative nausea and vomiting)   . RBD (REM behavioral disorder)   . Rheumatoid arthritis(714.0)   . Scoliosis of lumbar spine   . Spinal stenosis    Multiple levels  . 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 Trigeminal neuralgia 03/30/2015  . Vision abnormalities     Past Surgical History:  Procedure Laterality Date  . ABDOMINAL HYSTERECTOMY    . AUGMENTATION MAMMAPLASTY Bilateral   . COLONOSCOPY    . JOINT REPLACEMENT     right uni knee, right hip with 2 revisions  . KIDNEY STONE SURGERY Bilateral   . KNEE ARTHROTOMY Right 02/27/2018   Procedure: RIGHT KNEE ARTHROTOMY; scar excision;  Surgeon: Gaynelle Arabian, MD;  Location: [facility];  Service: Orthopedics;  Laterality: Right;  . MOHS SURGERY    . TONSILLECTOMY    . TOTAL HIP ARTHROPLASTY    . TOTAL KNEE ARTHROPLASTY  01/22/2013   Procedure: TOTAL KNEE ARTHROPLASTY;  Surgeon: Gearlean Alf, MD;  Location: [facility];  Service: Orthopedics;  Laterality: Right;  Revision of a Right Uni Knee to a Total Knee Arthroplasty    There were no vitals filed for this visit.  Subjective Assessment - 04/10/18 0851    Subjective  good report from MD - greatest complaint if L hip/back currently    Patient Stated Goals  improve walking and mobility    Currently in Pain?  Yes    Pain Score  2     Pain Location  Knee    Pain Orientation  Right    Pain Descriptors / Indicators  Sore;Tightness    Pain Type  Acute pain;Surgical pain                       [facility] Adult PT Treatment/Exercise - 04/10/18 0852      Knee/Hip Exercises: Aerobic   Recumbent Bike  L2 x 8 min      Knee/Hip Exercises: Machines for Strengthening   Cybex Knee Extension  B con/R ecc - 25# x 15    Cybex Knee Flexion  B con/R ecc - 25# x 15    Cybex Leg Press  B  LE - 35# 2 x 15      Knee/Hip Exercises: Standing   Forward Lunges  Right;Left;10 reps fwd step - 1 UE support at counter    Functional Squat  15 reps    Functional Squat Limitations  TRX      Manual Therapy   Manual Therapy  Soft tissue mobilization    Manual therapy comments  patient supine    Soft tissue mobilization  STM to R quad with roller stick                PT Short Term Goals - 03/22/18 0926      PT SHORT TERM GOAL #1   Title  patient to be independent with initial HEP    Status  Achieved      PT SHORT TERM GOAL #2   Title  patient to improve R knee PROM  0 - 90    Status  Achieved        PT Long Term Goals - 03/29/18 0936      PT LONG TERM GOAL #1   Title  patient to be independent with advanced HEP    Status  On-going      PT LONG TERM GOAL #2   Title  patient to improve R knee AROM 0-100    Status  On-going      PT LONG TERM GOAL #3   Title  patient to demonstrate proper heel toe gait mechanics without evidence of instability    Status  On-going      PT LONG TERM GOAL #4   Title  patient to  improve R LE strength to >/= 4+/5    Status  On-going      PT LONG TERM GOAL #5   Title  patient to demonstrate reciprocal gait pattern up/down 1 flight of stairs without evidence of instability    Status  On-going            Plan - 04/10/18 0852    Clinical Impression Statement  Kristen Hansen seen by MD last week - pleased with current progress and advised to continue PT to continue to regain motion and functional mobility. Patient doing well with all strenghteing progressions. Introduced lunges today - difficulty with R LE forward - both with form and returning to upright positioning requiring moderate cueing. Will continue to progress towards goals.     PT Treatment/Interventions  ADLs/Self Care Home Management;Cryotherapy;Electrical Stimulation;Iontophoresis 4mg /ml Dexamethasone;Moist Heat;Ultrasound;Gait training;Stair training;Therapeutic activities;Therapeutic exercise;Balance training;Neuromuscular re-education;Patient/family education;Manual techniques;Functional mobility training;DME Instruction;Passive range of motion;Dry needling;Vasopneumatic Device;Taping    Consulted and Agree with Plan of Care  Patient       Patient will benefit from skilled therapeutic intervention in order to improve the following deficits and impairments:  Abnormal gait, Decreased activity tolerance, Decreased mobility, Decreased range of motion, Decreased strength, Difficulty walking, Increased muscle spasms, Increased fascial restricitons, Impaired flexibility, Pain  Visit Diagnosis: Acute pain of right knee  Difficulty in walking, not elsewhere classified  Other abnormalities of gait and mobility  Other symptoms and signs involving the musculoskeletal system     Problem List Patient Active Problem List   Diagnosis Date Noted  . 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 Excessive sleepiness 10/31/2016  . Avitaminosis D 03/30/2016  . Idiopathic peripheral  neuropathy 02/23/2016  . BP (high blood pressure) 02/22/2016  . Other fatigue 10/20/2015  . Trigeminal neuralgia 03/30/2015  . 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 Polypharmacy  09/11/2014  . Other long term (current) drug therapy 09/11/2014  . Arthritis or polyarthritis, rheumatoid ([place]) 05/14/2014  . Generalized OA 05/14/2014  . Rheumatoid arthritis ([place]) 05/14/2014  . Postop Hyponatremia 01/23/2013  . Postoperative anemia due to acute blood loss 01/23/2013  . Pain due to unicompartmental arthroplasty of knee ([place]) 01/22/2013  . Pain due to knee joint prosthesis ([place]) 01/22/2013  . Mucositis oral 08/20/2012     Lanney Gins, PT, DPT 04/10/18 11:27 AM   [facility] [address]  [place], [name], [address] Phone: [phone]   Fax:  [phone]  Name: Kristen Hansen MRN: [id number] Date of Birth: 1946-12-22

## 2018-04-12 ENCOUNTER — Ambulatory Visit: Payer: Medicare Other | Admitting: Physical Therapy

## 2018-04-12 ENCOUNTER — Encounter: Payer: Self-pay | Admitting: Physical Therapy

## 2018-04-12 DIAGNOSIS — M25561 Pain in right knee: Secondary | ICD-10-CM

## 2018-04-12 DIAGNOSIS — R262 Difficulty in walking, not elsewhere classified: Secondary | ICD-10-CM

## 2018-04-12 DIAGNOSIS — R2689 Other abnormalities of gait and mobility: Secondary | ICD-10-CM

## 2018-04-12 DIAGNOSIS — R29898 Other symptoms and signs involving the musculoskeletal system: Secondary | ICD-10-CM

## 2018-04-12 NOTE — Therapy (Signed)
[facility] [address]  [place], [name], [address] Phone: [phone]   Fax:  [phone]  Physical Therapy Treatment  Patient Details  Name: Kristen Hansen MRN: [id number] Date of Birth: 1946/10/04 Referring Provider: Dr. Gaynelle Arabian   Encounter Date: 04/12/2018  PT End of Session - 04/12/18 1056    Visit Number  15    Number of Visits  20    Date for PT Re-Evaluation  05/03/18    Authorization Type  Medicare    PT Start Time  1052    PT Stop Time  1133    PT Time Calculation (min)  41 min    Activity Tolerance  Patient tolerated treatment well    Behavior During Therapy  [facility] for tasks assessed/performed       Past Medical History:  Diagnosis Date  . Back pain   . Cancer (HCC)    Skin, basal cell  . Dyspnea   . Fatigue   . GERD (gastroesophageal reflux disease)   . Headache(784.0)    history of migraines  . History of kidney stones   . Hypercholesteremia   . Hypertension   . Leg swelling    Bilateral  . Occasional tremors    mild hand tremors  . Osteoarthritis   . PONV (postoperative nausea and vomiting)   . RBD (REM behavioral disorder)   . Rheumatoid arthritis(714.0)   . Scoliosis of lumbar spine   . Spinal stenosis    Multiple levels  . 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 Trigeminal neuralgia 03/30/2015  . Vision abnormalities     Past Surgical History:  Procedure Laterality Date  . ABDOMINAL HYSTERECTOMY    . AUGMENTATION MAMMAPLASTY Bilateral   . COLONOSCOPY    . JOINT REPLACEMENT     right uni knee, right hip with 2 revisions  . KIDNEY STONE SURGERY Bilateral   . KNEE ARTHROTOMY Right 02/27/2018   Procedure: RIGHT KNEE ARTHROTOMY; scar excision;  Surgeon: Gaynelle Arabian, MD;  Location: [facility];  Service: Orthopedics;  Laterality: Right;  . MOHS SURGERY    . TONSILLECTOMY    . TOTAL HIP ARTHROPLASTY    . TOTAL KNEE ARTHROPLASTY  01/22/2013   Procedure: TOTAL KNEE ARTHROPLASTY;  Surgeon: Gearlean Alf, MD;  Location: [facility];  Service: Orthopedics;  Laterality: Right;  Revision of a Right Uni Knee to a Total Knee Arthroplasty    There were no vitals filed for this visit.  Subjective Assessment - 04/12/18 1054    Subjective  doing well - no new complaints    Patient Stated Goals  improve walking and mobility    Currently in Pain?  Yes    Pain Score  1     Pain Location  Knee    Pain Orientation  Right    Pain Descriptors / Indicators  Sore    Pain Type  Acute pain;Surgical pain                       [facility] Adult PT Treatment/Exercise - 04/12/18 1056      Knee/Hip Exercises: Aerobic   Recumbent Bike  L2 x 6 min      Knee/Hip Exercises: Machines for Strengthening   Cybex Knee Extension  B con/R ecc - 25# x 15    Cybex Knee Flexion  B con/R ecc - 25# x 15    Cybex Leg Press  B LE - 35# x 15  Knee/Hip Exercises: Standing   Functional Squat  15 reps    Functional Squat Limitations  TRX    Wall Squat  15 reps;3 seconds      Knee/Hip Exercises: Supine   Bridges with Diona Foley Squeeze  Strengthening;Both;15 reps    Straight Leg Raises  Strengthening;Right;15 reps 3#    Straight Leg Raise with External Rotation  Strengthening;Right;15 reps 3# - very little ER at hip      Knee/Hip Exercises: Sidelying   Hip ABduction  Strengthening;Right;15 reps               PT Short Term Goals - 03/22/18 0926      PT SHORT TERM GOAL #1   Title  patient to be independent with initial HEP    Status  Achieved      PT SHORT TERM GOAL #2   Title  patient to improve R knee PROM  0 - 90    Status  Achieved        PT Long Term Goals - 03/29/18 0936      PT LONG TERM GOAL #1   Title  patient to be independent with advanced HEP    Status  On-going      PT LONG TERM GOAL #2   Title  patient to improve R knee AROM 0-100    Status  On-going      PT LONG TERM GOAL #3   Title  patient to demonstrate proper heel toe gait mechanics without evidence of instability     Status  On-going      PT LONG TERM GOAL #4   Title  patient to improve R LE strength to >/= 4+/5    Status  On-going      PT LONG TERM GOAL #5   Title  patient to demonstrate reciprocal gait pattern up/down 1 flight of stairs without evidence of instability    Status  On-going            Plan - 04/12/18 1056    Clinical Impression Statement  Anaid doing well - reports increased activity this week with resultant fatigue following, as expected. PT sessions continue to focus on R knee flexion ROM as well as end range quad strength, and overall endurance for full carryover into the community. Making good progress towards goals.     PT Treatment/Interventions  ADLs/Self Care Home Management;Cryotherapy;Electrical Stimulation;Iontophoresis 4mg /ml Dexamethasone;Moist Heat;Ultrasound;Gait training;Stair training;Therapeutic activities;Therapeutic exercise;Balance training;Neuromuscular re-education;Patient/family education;Manual techniques;Functional mobility training;DME Instruction;Passive range of motion;Dry needling;Vasopneumatic Device;Taping    Consulted and Agree with Plan of Care  Patient       Patient will benefit from skilled therapeutic intervention in order to improve the following deficits and impairments:  Abnormal gait, Decreased activity tolerance, Decreased mobility, Decreased range of motion, Decreased strength, Difficulty walking, Increased muscle spasms, Increased fascial restricitons, Impaired flexibility, Pain  Visit Diagnosis: Acute pain of right knee  Difficulty in walking, not elsewhere classified  Other abnormalities of gait and mobility  Other symptoms and signs involving the musculoskeletal system     Problem List Patient Active Problem List   Diagnosis Date Noted  . 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 Excessive sleepiness 10/31/2016  . Avitaminosis D 03/30/2016  . Idiopathic peripheral neuropathy  02/23/2016  . BP (high blood pressure) 02/22/2016  . Other fatigue 10/20/2015  . Trigeminal neuralgia 03/30/2015  . 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 Polypharmacy 09/11/2014  . Other long term (current) drug  therapy 09/11/2014  . Arthritis or polyarthritis, rheumatoid ([place]) 05/14/2014  . Generalized OA 05/14/2014  . Rheumatoid arthritis ([place]) 05/14/2014  . Postop Hyponatremia 01/23/2013  . Postoperative anemia due to acute blood loss 01/23/2013  . Pain due to unicompartmental arthroplasty of knee ([place]) 01/22/2013  . Pain due to knee joint prosthesis ([place]) 01/22/2013  . Mucositis oral 08/20/2012     Lanney Gins, PT, DPT 04/12/18 11:33 AM   [facility] [address]  [place], [name], [address] Phone: [phone]   Fax:  [phone]  Name: SHALAYNA ORNSTEIN MRN: [id number] Date of Birth: 12-29-1945

## 2018-04-16 ENCOUNTER — Ambulatory Visit: Payer: Medicare Other | Admitting: Physical Therapy

## 2018-04-16 ENCOUNTER — Encounter: Payer: Self-pay | Admitting: Physical Therapy

## 2018-04-16 DIAGNOSIS — M25561 Pain in right knee: Secondary | ICD-10-CM | POA: Diagnosis not present

## 2018-04-16 DIAGNOSIS — R2689 Other abnormalities of gait and mobility: Secondary | ICD-10-CM

## 2018-04-16 DIAGNOSIS — R262 Difficulty in walking, not elsewhere classified: Secondary | ICD-10-CM

## 2018-04-16 DIAGNOSIS — R29898 Other symptoms and signs involving the musculoskeletal system: Secondary | ICD-10-CM

## 2018-04-16 NOTE — Therapy (Signed)
[facility] [address]  [place], [name], [address] Phone: [phone]   Fax:  [phone]  Physical Therapy Treatment  Patient Details  Name: Kristen Hansen MRN: [id number] Date of Birth: 02-Apr-1946 Referring Provider: Dr. Gaynelle Arabian   Encounter Date: 04/16/2018  PT End of Session - 04/16/18 1550    Visit Number  16    Number of Visits  20    Date for PT Re-Evaluation  05/03/18    Authorization Type  Medicare    PT Start Time  1529    PT Stop Time  1615    PT Time Calculation (min)  46 min    Activity Tolerance  Patient tolerated treatment well    Behavior During Therapy  [facility] for tasks assessed/performed       Past Medical History:  Diagnosis Date  . Back pain   . Cancer (HCC)    Skin, basal cell  . Dyspnea   . Fatigue   . GERD (gastroesophageal reflux disease)   . Headache(784.0)    history of migraines  . History of kidney stones   . Hypercholesteremia   . Hypertension   . Leg swelling    Bilateral  . Occasional tremors    mild hand tremors  . Osteoarthritis   . PONV (postoperative nausea and vomiting)   . RBD (REM behavioral disorder)   . Rheumatoid arthritis(714.0)   . Scoliosis of lumbar spine   . Spinal stenosis    Multiple levels  . 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 Trigeminal neuralgia 03/30/2015  . Vision abnormalities     Past Surgical History:  Procedure Laterality Date  . ABDOMINAL HYSTERECTOMY    . AUGMENTATION MAMMAPLASTY Bilateral   . COLONOSCOPY    . JOINT REPLACEMENT     right uni knee, right hip with 2 revisions  . KIDNEY STONE SURGERY Bilateral   . KNEE ARTHROTOMY Right 02/27/2018   Procedure: RIGHT KNEE ARTHROTOMY; scar excision;  Surgeon: Gaynelle Arabian, MD;  Location: [facility];  Service: Orthopedics;  Laterality: Right;  . MOHS SURGERY    . TONSILLECTOMY    . TOTAL HIP ARTHROPLASTY    . TOTAL KNEE ARTHROPLASTY  01/22/2013   Procedure: TOTAL KNEE ARTHROPLASTY;  Surgeon: Gearlean Alf, MD;  Location: [facility];  Service: Orthopedics;  Laterality: Right;  Revision of a Right Uni Knee to a Total Knee Arthroplasty    There were no vitals filed for this visit.  Subjective Assessment - 04/16/18 1532    Subjective  biggest complaint is L sided low back pain - knee is not bothering her    Patient Stated Goals  improve walking and mobility    Currently in Pain?  Yes    Pain Score  6     Pain Location  Back    Pain Orientation  Left;Lateral    Pain Descriptors / Indicators  Aching;Sore;Discomfort    Pain Type  Chronic pain                       [facility] Adult PT Treatment/Exercise - 04/16/18 1551      Ambulation/Gait   Stairs  Yes    Stairs Assistance  6: Modified independent (Device/Increase time)    Stair Management Technique  One rail Right;Alternating pattern    Number of Stairs  -- 14 x 2    Height of Stairs  8    Gait Comments  poor knee  extension on R LE when ascending, smooth descending pattern      Knee/Hip Exercises: Stretches   Gastroc Stretch  Right;3 reps;30 seconds prostretch      Knee/Hip Exercises: Aerobic   Recumbent Bike  L2 x 6 min      Knee/Hip Exercises: Standing   Terminal Knee Extension  Strengthening;Right;15 reps;Theraband    Theraband Level (Terminal Knee Extension)  Level 4 (Blue)    Step Down  Right;15 reps;Hand Hold: 2;Step Height: 6";2 sets    Other Standing Knee Exercises  B alternating step up to 8" - + opposite knee drive x 15       Knee/Hip Exercises: Seated   Long Arc Quad  Strengthening;Right;15 reps;Weights + ball squeeze    Long Arc Quad Weight  4 lbs.      Manual Therapy   Manual Therapy  Joint mobilization    Manual therapy comments  patient supine    Joint Mobilization  patellar mobs - all planes               PT Short Term Goals - 03/22/18 0926      PT SHORT TERM GOAL #1   Title  patient to be independent with initial HEP    Status  Achieved      PT SHORT TERM GOAL #2   Title   patient to improve R knee PROM  0 - 90    Status  Achieved        PT Long Term Goals - 03/29/18 0936      PT LONG TERM GOAL #1   Title  patient to be independent with advanced HEP    Status  On-going      PT LONG TERM GOAL #2   Title  patient to improve R knee AROM 0-100    Status  On-going      PT LONG TERM GOAL #3   Title  patient to demonstrate proper heel toe gait mechanics without evidence of instability    Status  On-going      PT LONG TERM GOAL #4   Title  patient to improve R LE strength to >/= 4+/5    Status  On-going      PT LONG TERM GOAL #5   Title  patient to demonstrate reciprocal gait pattern up/down 1 flight of stairs without evidence of instability    Status  On-going            Plan - 04/16/18 [place] today with reports of general fatigue limiting activity as well as most difficulty at home with stair navigation with most deficit with end range quad strength when ascending stairs. Heavy work today for quad activation with good tolerance. Will continue to progress this for improved functional mobility.     PT Treatment/Interventions  ADLs/Self Care Home Management;Cryotherapy;Electrical Stimulation;Iontophoresis 4mg /ml Dexamethasone;Moist Heat;Ultrasound;Gait training;Stair training;Therapeutic activities;Therapeutic exercise;Balance training;Neuromuscular re-education;Patient/family education;Manual techniques;Functional mobility training;DME Instruction;Passive range of motion;Dry needling;Vasopneumatic Device;Taping    Consulted and Agree with Plan of Care  Patient       Patient will benefit from skilled therapeutic intervention in order to improve the following deficits and impairments:  Abnormal gait, Decreased activity tolerance, Decreased mobility, Decreased range of motion, Decreased strength, Difficulty walking, Increased muscle spasms, Increased fascial restricitons, Impaired flexibility, Pain  Visit  Diagnosis: Acute pain of right knee  Difficulty in walking, not elsewhere classified  Other abnormalities of gait and mobility  Other symptoms and signs  involving the musculoskeletal system     Problem List Patient Active Problem List   Diagnosis Date Noted  . 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 Excessive sleepiness 10/31/2016  . Avitaminosis D 03/30/2016  . Idiopathic peripheral neuropathy 02/23/2016  . BP (high blood pressure) 02/22/2016  . Other fatigue 10/20/2015  . Trigeminal neuralgia 03/30/2015  . 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 Polypharmacy 09/11/2014  . Other long term (current) drug therapy 09/11/2014  . Arthritis or polyarthritis, rheumatoid ([place]) 05/14/2014  . Generalized OA 05/14/2014  . Rheumatoid arthritis ([place]) 05/14/2014  . Postop Hyponatremia 01/23/2013  . Postoperative anemia due to acute blood loss 01/23/2013  . Pain due to unicompartmental arthroplasty of knee ([place]) 01/22/2013  . Pain due to knee joint prosthesis ([place]) 01/22/2013  . Mucositis oral 08/20/2012     Lanney Gins, PT, DPT 04/16/18 5:11 PM   [facility] [address]  [place], [name], [address] Phone: [phone]   Fax:  [phone]  Name: TANGIA PINARD MRN: [id number] Date of Birth: 11/23/1946

## 2018-04-19 ENCOUNTER — Ambulatory Visit: Payer: Medicare Other

## 2018-04-19 DIAGNOSIS — M25561 Pain in right knee: Secondary | ICD-10-CM | POA: Diagnosis not present

## 2018-04-19 DIAGNOSIS — R2689 Other abnormalities of gait and mobility: Secondary | ICD-10-CM

## 2018-04-19 DIAGNOSIS — R262 Difficulty in walking, not elsewhere classified: Secondary | ICD-10-CM

## 2018-04-19 DIAGNOSIS — R29898 Other symptoms and signs involving the musculoskeletal system: Secondary | ICD-10-CM

## 2018-04-19 NOTE — Therapy (Signed)
[facility] [address]  [place], [name], [address] Phone: [phone]   Fax:  [phone]  Physical Therapy Treatment  Patient Details  Name: Kristen Hansen MRN: [id number] Date of Birth: [birth year] Referring Provider: Dr. Gaynelle Arabian   Encounter Date: 04/19/2018  PT End of Session - 04/19/18 0900    Visit Number  17    Number of Visits  20    Date for PT Re-Evaluation  05/03/18    Authorization Type  Medicare    PT Start Time  0853    PT Stop Time  0943    PT Time Calculation (min)  50 min    Activity Tolerance  Patient tolerated treatment well    Behavior During Therapy  [facility] for tasks assessed/performed       Past Medical History:  Diagnosis Date  . Back pain   . Cancer (HCC)    Skin, basal cell  . Dyspnea   . Fatigue   . GERD (gastroesophageal reflux disease)   . Headache(784.0)    history of migraines  . History of kidney stones   . Hypercholesteremia   . Hypertension   . Leg swelling    Bilateral  . Occasional tremors    mild hand tremors  . Osteoarthritis   . PONV (postoperative nausea and vomiting)   . RBD (REM behavioral disorder)   . Rheumatoid arthritis(714.0)   . Scoliosis of lumbar spine   . Spinal stenosis    Multiple levels  . 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 Trigeminal neuralgia 03/30/2015  . Vision abnormalities     Past Surgical History:  Procedure Laterality Date  . ABDOMINAL HYSTERECTOMY    . AUGMENTATION MAMMAPLASTY Bilateral   . COLONOSCOPY    . JOINT REPLACEMENT     right uni knee, right hip with 2 revisions  . KIDNEY STONE SURGERY Bilateral   . KNEE ARTHROTOMY Right 02/27/2018   Procedure: RIGHT KNEE ARTHROTOMY; scar excision;  Surgeon: Gaynelle Arabian, MD;  [facility]: [facility];  Service: Orthopedics;  Laterality: Right;  . MOHS SURGERY    . TONSILLECTOMY    . TOTAL HIP ARTHROPLASTY    . TOTAL KNEE ARTHROPLASTY  01/22/2013   Procedure: TOTAL KNEE ARTHROPLASTY;  Surgeon: Gearlean Alf, MD;  Location: [facility];  Service: Orthopedics;  Laterality: Right;  Revision of a Right Uni Knee to a Total Knee Arthroplasty    There were no vitals filed for this visit.  Subjective Assessment - 04/19/18 0858    Subjective  Reports most difficulty at home descending stairs into basement for laundry.     Patient is accompained by:  Family member    Patient Stated Goals  improve walking and mobility    Currently in Pain?  Yes    Pain Score  3     Pain Location  Back    Pain Orientation  Left    Pain Descriptors / Indicators  Aching;Sore;Sharp    Pain Type  Chronic pain    Pain Frequency  Constant    Aggravating Factors   Bending over     Multiple Pain Sites  No                       [facility] Adult PT Treatment/Exercise - 04/19/18 0918      Knee/Hip Exercises: Stretches   Hip Flexor Stretch  1 rep;60 seconds    Hip Flexor Stretch Limitations  with R  LE in mod thomas pos resting on 4" step    Knee: Self-Stretch to increase Flexion  Right 5" x 10 reps R LE on edge of TM      Knee/Hip Exercises: Aerobic   Recumbent Bike  L2 x 6 min seated progressively moved forward for greater flexion ROM      Knee/Hip Exercises: Standing   Heel Raises  Both;1 set;20 reps    Heel Raises Limitations  B con/R ecc     Forward Lunges  Right;Left x 12 reps; 1 UE support at chair; tc for technique     Forward Step Up  Right;Step Height: 6";Step Height: 2"    Forward Step Up Limitations  red band TKE; 2 ski poles     Step Down  Right;15 reps;Hand Hold: 2;Step Height: 6";1 set Focusing on less UE support and more R quad activation       Vasopneumatic   Number Minutes Vasopneumatic   10 minutes    Vasopnuematic Location   Knee R    Vasopneumatic Pressure  High    Vasopneumatic Temperature   coldest               PT Short Term Goals - 03/22/18 9811      PT SHORT TERM GOAL #1   Title  patient to be independent with initial HEP    Status  Achieved      PT SHORT TERM  GOAL #2   Title  patient to improve R knee PROM  0 - 90    Status  Achieved        PT Long Term Goals - 03/29/18 0936      PT LONG TERM GOAL #1   Title  patient to be independent with advanced HEP    Status  On-going      PT LONG TERM GOAL #2   Title  patient to improve R knee AROM 0-100    Status  On-going      PT LONG TERM GOAL #3   Title  patient to demonstrate proper heel toe gait mechanics without evidence of instability    Status  On-going      PT LONG TERM GOAL #4   Title  patient to improve R LE strength to >/= 4+/5    Status  On-going      PT LONG TERM GOAL #5   Title  patient to demonstrate reciprocal gait pattern up/down 1 flight of stairs without evidence of instability    Status  On-going            Plan - 04/19/18 0902    Clinical Impression Statement  Kristen Hansen reporting most difficulty at home descending "steep", stairs to access basement for laundry.  Treatment focusing on quad/VMO strengthening with eccentric step-down today and TKE strength with stepping and lunging activities.  Kristen Hansen tolerated all activities well however most difficulty with R LE back position in lunge activity.  Required tactile/verbal cueing with therex today for proper technique.  Ended with ice/compression to R knee per pt. request to decrease post exercise pain and swelling.    PT Treatment/Interventions  ADLs/Self Care Home Management;Cryotherapy;Electrical Stimulation;Iontophoresis 4mg /ml Dexamethasone;Moist Heat;Ultrasound;Gait training;Stair training;Therapeutic activities;Therapeutic exercise;Balance training;Neuromuscular re-education;Patient/family education;Manual techniques;Functional mobility training;DME Instruction;Passive range of motion;Dry needling;Vasopneumatic Device;Taping    Consulted and Agree with Plan of Care  Patient       Patient will benefit from skilled therapeutic intervention in order to improve the following deficits and impairments:  Abnormal gait,  Decreased  activity tolerance, Decreased mobility, Decreased range of motion, Decreased strength, Difficulty walking, Increased muscle spasms, Increased fascial restricitons, Impaired flexibility, Pain  Visit Diagnosis: Acute pain of right knee  Difficulty in walking, not elsewhere classified  Other abnormalities of gait and mobility  Other symptoms and signs involving the musculoskeletal system     Problem List Patient Active Problem List   Diagnosis Date Noted  . 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 Excessive sleepiness 10/31/2016  . Avitaminosis D 03/30/2016  . Idiopathic peripheral neuropathy 02/23/2016  . BP (high blood pressure) 02/22/2016  . Other fatigue 10/20/2015  . Trigeminal neuralgia 03/30/2015  . 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 Polypharmacy 09/11/2014  . Other long term (current) drug therapy 09/11/2014  . Arthritis or polyarthritis, rheumatoid ([place]) 05/14/2014  . Generalized OA 05/14/2014  . Rheumatoid arthritis ([place]) 05/14/2014  . Postop Hyponatremia 01/23/2013  . Postoperative anemia due to acute blood loss 01/23/2013  . Pain due to unicompartmental arthroplasty of knee ([place]) 01/22/2013  . Pain due to knee joint prosthesis ([place]) 01/22/2013  . Mucositis oral 08/20/2012    Bess Harvest, PTA 04/19/18 11:47 AM  [facility] [address]  [place], [name], [address] Phone: [phone]   Fax:  [phone]  Name: Kristen Hansen MRN: [id number] Date of Birth: 25-Jan-1946

## 2018-04-22 ENCOUNTER — Ambulatory Visit: Payer: Medicare Other | Admitting: Physical Therapy

## 2018-04-23 ENCOUNTER — Ambulatory Visit: Payer: Medicare Other | Admitting: Physical Therapy

## 2018-04-23 ENCOUNTER — Encounter: Payer: Self-pay | Admitting: Physical Therapy

## 2018-04-23 DIAGNOSIS — M25561 Pain in right knee: Secondary | ICD-10-CM

## 2018-04-23 DIAGNOSIS — R262 Difficulty in walking, not elsewhere classified: Secondary | ICD-10-CM

## 2018-04-23 DIAGNOSIS — R2689 Other abnormalities of gait and mobility: Secondary | ICD-10-CM

## 2018-04-23 DIAGNOSIS — R29898 Other symptoms and signs involving the musculoskeletal system: Secondary | ICD-10-CM

## 2018-04-23 NOTE — Therapy (Signed)
[facility] [address]  [place], [name], [address] Phone: [phone]   Fax:  [phone]  Physical Therapy Treatment  Patient Details  Name: Kristen Hansen MRN: [id number] Date of Birth: 15-Dec-1946 Referring Provider: Dr. Gaynelle Arabian   Encounter Date: 04/23/2018  PT End of Session - 04/23/18 1444    Visit Number  18    Number of Visits  20    Date for PT Re-Evaluation  05/03/18    Authorization Type  Medicare    PT Start Time  0109    PT Stop Time  1527    PT Time Calculation (min)  46 min    Activity Tolerance  Patient tolerated treatment well    Behavior During Therapy  [facility] for tasks assessed/performed       Past Medical History:  Diagnosis Date  . Back pain   . Cancer (HCC)    Skin, basal cell  . Dyspnea   . Fatigue   . GERD (gastroesophageal reflux disease)   . Headache(784.0)    history of migraines  . History of kidney stones   . Hypercholesteremia   . Hypertension   . Leg swelling    Bilateral  . Occasional tremors    mild hand tremors  . Osteoarthritis   . PONV (postoperative nausea and vomiting)   . RBD (REM behavioral disorder)   . Rheumatoid arthritis(714.0)   . Scoliosis of lumbar spine   . Spinal stenosis    Multiple levels  . 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 Trigeminal neuralgia 03/30/2015  . Vision abnormalities     Past Surgical History:  Procedure Laterality Date  . ABDOMINAL HYSTERECTOMY    . AUGMENTATION MAMMAPLASTY Bilateral   . COLONOSCOPY    . JOINT REPLACEMENT     right uni knee, right hip with 2 revisions  . KIDNEY STONE SURGERY Bilateral   . KNEE ARTHROTOMY Right 02/27/2018   Procedure: RIGHT KNEE ARTHROTOMY; scar excision;  Surgeon: Gaynelle Arabian, MD;  Location: [facility];  Service: Orthopedics;  Laterality: Right;  . MOHS SURGERY    . TONSILLECTOMY    . TOTAL HIP ARTHROPLASTY    . TOTAL KNEE ARTHROPLASTY  01/22/2013   Procedure: TOTAL KNEE ARTHROPLASTY;  Surgeon: Gearlean Alf, MD;  Location: [facility];  Service: Orthopedics;  Laterality: Right;  Revision of a Right Uni Knee to a Total Knee Arthroplasty    There were no vitals filed for this visit.  Subjective Assessment - 04/23/18 1442    Subjective  knee is feeling well    Patient Stated Goals  improve walking and mobility    Currently in Pain?  No/denies    Pain Score  0-No pain                       [facility] Adult PT Treatment/Exercise - 04/23/18 1445      Knee/Hip Exercises: Aerobic   Recumbent Bike  L2 x 7 min      Knee/Hip Exercises: Standing   Other Standing Knee Exercises  forward cone step overs 4 x 4    Other Standing Knee Exercises  lateral cone step overs 4 x 4      Knee/Hip Exercises: Seated   Sit to Sand  10 reps from box + AirEx - little UE support from PT      Manual Therapy   Manual Therapy  Soft tissue mobilization    Manual therapy comments  patient seated EOM    Soft tissue mobilization  roller stick to R quad               PT Short Term Goals - 03/22/18 0926      PT SHORT TERM GOAL #1   Title  patient to be independent with initial HEP    Status  Achieved      PT SHORT TERM GOAL #2   Title  patient to improve R knee PROM  0 - 90    Status  Achieved        PT Long Term Goals - 03/29/18 0936      PT LONG TERM GOAL #1   Title  patient to be independent with advanced HEP    Status  On-going      PT LONG TERM GOAL #2   Title  patient to improve R knee AROM 0-100    Status  On-going      PT LONG TERM GOAL #3   Title  patient to demonstrate proper heel toe gait mechanics without evidence of instability    Status  On-going      PT LONG TERM GOAL #4   Title  patient to improve R LE strength to >/= 4+/5    Status  On-going      PT LONG TERM GOAL #5   Title  patient to demonstrate reciprocal gait pattern up/down 1 flight of stairs without evidence of instability    Status  On-going            Plan - 04/23/18 1445    Clinical  Impression Statement  PT session today focusing on rising from low surfaces as patient reports difficulty when gardening and down low. Also continuing to work on stepping over obstacles both forward and laterally with Ivin Booty demonstrating R hip hike + hip IR to clear objects - heavy VC/TC to reduce these compensations. Likely a learned behavior due to prior surgeries at R hip as well as noted leg length discrepency. Will continue to promote funcitonal mobility.     PT Treatment/Interventions  ADLs/Self Care Home Management;Cryotherapy;Electrical Stimulation;Iontophoresis 4mg /ml Dexamethasone;Moist Heat;Ultrasound;Gait training;Stair training;Therapeutic activities;Therapeutic exercise;Balance training;Neuromuscular re-education;Patient/family education;Manual techniques;Functional mobility training;DME Instruction;Passive range of motion;Dry needling;Vasopneumatic Device;Taping    Consulted and Agree with Plan of Care  Patient       Patient will benefit from skilled therapeutic intervention in order to improve the following deficits and impairments:  Abnormal gait, Decreased activity tolerance, Decreased mobility, Decreased range of motion, Decreased strength, Difficulty walking, Increased muscle spasms, Increased fascial restricitons, Impaired flexibility, Pain  Visit Diagnosis: Acute pain of right knee  Difficulty in walking, not elsewhere classified  Other abnormalities of gait and mobility  Other symptoms and signs involving the musculoskeletal system     Problem List Patient Active Problem List   Diagnosis Date Noted  . 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 Excessive sleepiness 10/31/2016  . Avitaminosis D 03/30/2016  . Idiopathic peripheral neuropathy 02/23/2016  . BP (high blood pressure) 02/22/2016  . Other fatigue 10/20/2015  . Trigeminal neuralgia 03/30/2015  . 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 Polypharmacy 09/11/2014  .  Other long term (current) drug therapy 09/11/2014  . Arthritis or polyarthritis, rheumatoid ([place]) 05/14/2014  . Generalized OA 05/14/2014  . Rheumatoid arthritis ([place]) 05/14/2014  . Postop Hyponatremia 01/23/2013  . Postoperative anemia due to acute blood loss 01/23/2013  . Pain due to unicompartmental arthroplasty of knee (HCC)  01/22/2013  . Pain due to knee joint prosthesis ([place]) 01/22/2013  . Mucositis oral 08/20/2012     Lanney Gins, PT, DPT 04/23/18 5:51 PM   [facility] [address]  [place], [name], [address] Phone: [phone]   Fax:  [phone]  Name: Kristen Hansen MRN: [id number] Date of Birth: 26-Mar-1946

## 2018-04-26 ENCOUNTER — Ambulatory Visit: Payer: Medicare Other | Attending: Orthopedic Surgery

## 2018-04-26 DIAGNOSIS — R262 Difficulty in walking, not elsewhere classified: Secondary | ICD-10-CM | POA: Insufficient documentation

## 2018-04-26 DIAGNOSIS — R2689 Other abnormalities of gait and mobility: Secondary | ICD-10-CM | POA: Insufficient documentation

## 2018-04-26 DIAGNOSIS — R29898 Other symptoms and signs involving the musculoskeletal system: Secondary | ICD-10-CM | POA: Diagnosis present

## 2018-04-26 DIAGNOSIS — M25561 Pain in right knee: Secondary | ICD-10-CM | POA: Diagnosis present

## 2018-04-26 NOTE — Therapy (Signed)
[facility] [address]  [place], [name], [address] Phone: [phone]   Fax:  [phone]  Physical Therapy Treatment  Patient Details  Name: Kristen Hansen MRN: [id number] Date of Birth: 03-Jul-1946 Referring Provider: Dr. Gaynelle Arabian   Encounter Date: 04/26/2018  PT End of Session - 04/26/18 0851    Visit Number  19    Number of Visits  20    Date for PT Re-Evaluation  05/03/18    Authorization Type  Medicare    PT Start Time  0845    PT Stop Time  0930    PT Time Calculation (min)  45 min    Activity Tolerance  Patient tolerated treatment well    Behavior During Therapy  [facility] for tasks assessed/performed       Past Medical History:  Diagnosis Date  . Back pain   . Cancer (HCC)    Skin, basal cell  . Dyspnea   . Fatigue   . GERD (gastroesophageal reflux disease)   . Headache(784.0)    history of migraines  . History of kidney stones   . Hypercholesteremia   . Hypertension   . Leg swelling    Bilateral  . Occasional tremors    mild hand tremors  . Osteoarthritis   . PONV (postoperative nausea and vomiting)   . RBD (REM behavioral disorder)   . Rheumatoid arthritis(714.0)   . Scoliosis of lumbar spine   . Spinal stenosis    Multiple levels  . 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 Trigeminal neuralgia 03/30/2015  . Vision abnormalities     Past Surgical History:  Procedure Laterality Date  . ABDOMINAL HYSTERECTOMY    . AUGMENTATION MAMMAPLASTY Bilateral   . COLONOSCOPY    . JOINT REPLACEMENT     right uni knee, right hip with 2 revisions  . KIDNEY STONE SURGERY Bilateral   . KNEE ARTHROTOMY Right 02/27/2018   Procedure: RIGHT KNEE ARTHROTOMY; scar excision;  Surgeon: Gaynelle Arabian, MD;  Location: [facility];  Service: Orthopedics;  Laterality: Right;  . MOHS SURGERY    . TONSILLECTOMY    . TOTAL HIP ARTHROPLASTY    . TOTAL KNEE ARTHROPLASTY  01/22/2013   Procedure: TOTAL KNEE ARTHROPLASTY;  Surgeon: Gearlean Alf, MD;  Location: [facility];  Service: Orthopedics;  Laterality: Right;  Revision of a Right Uni Knee to a Total Knee Arthroplasty    There were no vitals filed for this visit.  Subjective Assessment - 04/26/18 0849    Subjective  Pt. noting she felt fine after last visit.  Still having difficulty rising from low surfaces.  feeling some R knee pain today which she attributes to weather change.      Patient Stated Goals  improve walking and mobility    Currently in Pain?  Yes    Pain Score  1     Pain Location  Knee    Pain Orientation  Right    Pain Descriptors / Indicators  Aching    Pain Type  Chronic pain    Multiple Pain Sites  No                       [facility] Adult PT Treatment/Exercise - 04/26/18 0904      Lumbar Exercises: Stretches   Single Knee to Chest Stretch  Right;Left;1 rep;20 seconds As pt. noting some LBP following stepping       Knee/Hip Exercises: Aerobic  Recumbent Bike  L2 x 7 min      Knee/Hip Exercises: Standing   Heel Raises  Both;1 set;20 reps    Heel Raises Limitations  B con/R ecc     Wall Squat  3 seconds;20 reps    Other Standing Knee Exercises  Forward, lateral cone step overs at counter 2 x 10; working on proper hip/knee flexion avoid substitutions       Knee/Hip Exercises: Seated   Long Arc Quad  Strengthening;Right;15 reps;Weights 3" hold     Long Arc Quad Weight  4 lbs.    Hamstring Curl  Right;15 reps;Strengthening    Hamstring Limitations  green tband with red TB at knee and cueing for hip abd/ER isometrics into band to prevent R hip IR    Sit to Sand  with UE support from mat table with red looped TB at knees                PT Short Term Goals - 03/22/18 0926      PT SHORT TERM GOAL #1   Title  patient to be independent with initial HEP    Status  Achieved      PT SHORT TERM GOAL #2   Title  patient to improve R knee PROM  0 - 90    Status  Achieved        PT Long Term Goals - 03/29/18 0936      PT  LONG TERM GOAL #1   Title  patient to be independent with advanced HEP    Status  On-going      PT LONG TERM GOAL #2   Title  patient to improve R knee AROM 0-100    Status  On-going      PT LONG TERM GOAL #3   Title  patient to demonstrate proper heel toe gait mechanics without evidence of instability    Status  On-going      PT LONG TERM GOAL #4   Title  patient to improve R LE strength to >/= 4+/5    Status  On-going      PT LONG TERM GOAL #5   Title  patient to demonstrate reciprocal gait pattern up/down 1 flight of stairs without evidence of instability    Status  On-going            Plan - 04/26/18 0852    Clinical Impression Statement  [name] doing well today however reporting low-level R knee pain to start visit, which she attributes to weather change.  Treatment focused on stability with stepping activities with cueing to prevent IR substitutions at R hip.  Continued training sit<>stand from low surfaces as pt. noting difficulty with this at home.  Pt. will continue to benefit from further skilled therapy to facilitate proper movement patterns and functional strength.    PT Treatment/Interventions  ADLs/Self Care Home Management;Cryotherapy;Electrical Stimulation;Iontophoresis 4mg /ml Dexamethasone;Moist Heat;Ultrasound;Gait training;Stair training;Therapeutic activities;Therapeutic exercise;Balance training;Neuromuscular re-education;Patient/family education;Manual techniques;Functional mobility training;DME Instruction;Passive range of motion;Dry needling;Vasopneumatic Device;Taping    Consulted and Agree with Plan of Care  Patient       Patient will benefit from skilled therapeutic intervention in order to improve the following deficits and impairments:  Abnormal gait, Decreased activity tolerance, Decreased mobility, Decreased range of motion, Decreased strength, Difficulty walking, Increased muscle spasms, Increased fascial restricitons, Impaired flexibility,  Pain  Visit Diagnosis: Acute pain of right knee  Difficulty in walking, not elsewhere classified  Other abnormalities of gait and mobility  Other  symptoms and signs involving the musculoskeletal system     Problem List Patient Active Problem List   Diagnosis Date Noted  . 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 Excessive sleepiness 10/31/2016  . Avitaminosis D 03/30/2016  . Idiopathic peripheral neuropathy 02/23/2016  . BP (high blood pressure) 02/22/2016  . Other fatigue 10/20/2015  . Trigeminal neuralgia 03/30/2015  . 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 Polypharmacy 09/11/2014  . Other long term (current) drug therapy 09/11/2014  . Arthritis or polyarthritis, rheumatoid ([place]) 05/14/2014  . Generalized OA 05/14/2014  . Rheumatoid arthritis ([place]) 05/14/2014  . Postop Hyponatremia 01/23/2013  . Postoperative anemia due to acute blood loss 01/23/2013  . Pain due to unicompartmental arthroplasty of knee ([place]) 01/22/2013  . Pain due to knee joint prosthesis ([place]) 01/22/2013  . Mucositis oral 08/20/2012    Bess Harvest, PTA 04/26/18 9:59 AM  [facility] [address]  [place], [name], [address] Phone: [phone]   Fax:  [phone]  Name: JORJA EMPIE MRN: [id number] Date of Birth: Dec 27, 1945

## 2018-04-29 ENCOUNTER — Encounter: Payer: Self-pay | Admitting: Physical Therapy

## 2018-04-29 ENCOUNTER — Ambulatory Visit: Payer: Medicare Other | Admitting: Physical Therapy

## 2018-04-29 DIAGNOSIS — M25561 Pain in right knee: Secondary | ICD-10-CM | POA: Diagnosis not present

## 2018-04-29 DIAGNOSIS — R262 Difficulty in walking, not elsewhere classified: Secondary | ICD-10-CM

## 2018-04-29 DIAGNOSIS — R29898 Other symptoms and signs involving the musculoskeletal system: Secondary | ICD-10-CM

## 2018-04-29 DIAGNOSIS — R2689 Other abnormalities of gait and mobility: Secondary | ICD-10-CM

## 2018-04-29 NOTE — Therapy (Signed)
[facility] [address]  [place], [name], [address] Phone: [phone]   Fax:  [phone]  Physical Therapy Treatment  Patient Details  Name: Kristen Hansen MRN: [id number] Date of Birth: [birth year] Referring Provider: Dr. Gaynelle Arabian  Progress Note Reporting Period 03/29/18 to 04/29/18  See note below for Objective Data and Assessment of Progress/Goals.      Encounter Date: 04/29/2018  PT End of Session - 04/29/18 1019    Visit Number  20    Number of Visits  28    Date for PT Re-Evaluation  06/03/18    Authorization Type  Medicare    PT Start Time  1015    PT Stop Time  1101    PT Time Calculation (min)  46 min    Activity Tolerance  Patient tolerated treatment well    Behavior During Therapy  [facility] for tasks assessed/performed       Past Medical History:  Diagnosis Date  . Back pain   . Cancer (HCC)    Skin, basal cell  . Dyspnea   . Fatigue   . GERD (gastroesophageal reflux disease)   . Headache(784.0)    history of migraines  . History of kidney stones   . Hypercholesteremia   . Hypertension   . Leg swelling    Bilateral  . Occasional tremors    mild hand tremors  . Osteoarthritis   . PONV (postoperative nausea and vomiting)   . RBD (REM behavioral disorder)   . Rheumatoid arthritis(714.0)   . Scoliosis of lumbar spine   . Spinal stenosis    Multiple levels  . 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 Trigeminal neuralgia 03/30/2015  . Vision abnormalities     Past Surgical History:  Procedure Laterality Date  . ABDOMINAL HYSTERECTOMY    . AUGMENTATION MAMMAPLASTY Bilateral   . COLONOSCOPY    . JOINT REPLACEMENT     right uni knee, right hip with 2 revisions  . KIDNEY STONE SURGERY Bilateral   . KNEE ARTHROTOMY Right 02/27/2018   Procedure: RIGHT KNEE ARTHROTOMY; scar excision;  Surgeon: Gaynelle Arabian, MD;  Location: [facility];  Service: Orthopedics;  Laterality: Right;  . MOHS SURGERY    . TONSILLECTOMY     . TOTAL HIP ARTHROPLASTY    . TOTAL KNEE ARTHROPLASTY  01/22/2013   Procedure: TOTAL KNEE ARTHROPLASTY;  Surgeon: Gearlean Alf, MD;  Location: [facility];  Service: Orthopedics;  Laterality: Right;  Revision of a Right Uni Knee to a Total Knee Arthroplasty    There were no vitals filed for this visit.  Subjective Assessment - 04/29/18 1017    Subjective  Doing well - knee is itching (nerves); steps at home is still difficult (not code size); other body parts are "acting up"    Patient Stated Goals  improve walking and mobility    Currently in Pain?  Yes    Pain Score  2     Pain Location  Knee    Pain Orientation  Right    Pain Descriptors / Indicators  Pounding    Pain Type  Chronic pain         [facility] PT Assessment - 04/29/18 0001      Assessment   Medical Diagnosis  s/p arthrotomy with scar excision    Referring Provider  Dr. Gaynelle Arabian    Onset Date/Surgical Date  02/27/18    Next MD Visit  05/07/18  Precautions   Precautions  None      Restrictions   Weight Bearing Restrictions  No      Observation/Other Assessments   Focus on Therapeutic Outcomes (FOTO)   Knee: 69 (31% limited)      PROM   Right Knee Extension  0    Right Knee Flexion  106                   [facility] Adult PT Treatment/Exercise - 04/29/18 0001      Knee/Hip Exercises: Aerobic   Recumbent Bike  L2 x 6 min      Knee/Hip Exercises: Machines for Strengthening   Cybex Knee Extension  B LE - 25# x 15    Cybex Leg Press  B LE - 35# x 15      Manual Therapy   Manual Therapy  Soft tissue mobilization;Passive ROM    Manual therapy comments  patient supine    Soft tissue mobilization  STM to R quad    Passive ROM  PROM into R knee flexion - aggressive to end range - with prolonged stretch       Trigger Point Dry Needling - 04/29/18 1120    Consent Given?  Yes    Education Handout Provided  Yes    Muscles Treated Lower Body  Quadriceps R VL    Quadriceps Response  Twitch response  elicited;Palpable increased muscle length             PT Short Term Goals - 03/22/18 0926      PT SHORT TERM GOAL #1   Title  patient to be independent with initial HEP    Status  Achieved      PT SHORT TERM GOAL #2   Title  patient to improve R knee PROM  0 - 90    Status  Achieved        PT Long Term Goals - 04/29/18 1135      PT LONG TERM GOAL #1   Title  patient to be independent with advanced HEP    Status  On-going      PT LONG TERM GOAL #2   Title  patient to improve R knee AROM 0-110    Status  Revised      PT LONG TERM GOAL #3   Title  patient to demonstrate proper heel toe gait mechanics without evidence of instability    Status  On-going      PT LONG TERM GOAL #4   Title  patient to improve R LE strength to >/= 4+/5    Status  On-going      PT LONG TERM GOAL #5   Title  patient to demonstrate reciprocal gait pattern up/down 1 flight of stairs without evidence of instability    Status  On-going            Plan - 04/29/18 1136    Clinical Impression Statement  Kristen Hansen continues to do well - does still demonstrate compensations and reduced gait mechanics at R LE - likely multifactorial in nature. Patient has been able to maintain current PROM, however does require aggressive stretching to reach desired PROM. Discussion on DN treatment today with PT providing education and patient wishing to proceed. Good twitch reposne noted with this. Kristen Hansen to continue to benefit from skilled PT intervention to address gait mechanics to reduce compensations and promote a more normalized gait pattern as well as to continue to progress AROM and strength.  PT Treatment/Interventions  ADLs/Self Care Home Management;Cryotherapy;Electrical Stimulation;Iontophoresis 4mg /ml Dexamethasone;Moist Heat;Ultrasound;Gait training;Stair training;Therapeutic activities;Therapeutic exercise;Balance training;Neuromuscular re-education;Patient/family education;Manual  techniques;Functional mobility training;DME Instruction;Passive range of motion;Dry needling;Vasopneumatic Device;Taping    Consulted and Agree with Plan of Care  Patient       Patient will benefit from skilled therapeutic intervention in order to improve the following deficits and impairments:  Abnormal gait, Decreased activity tolerance, Decreased mobility, Decreased range of motion, Decreased strength, Difficulty walking, Increased muscle spasms, Increased fascial restricitons, Impaired flexibility, Pain  Visit Diagnosis: Acute pain of right knee  Difficulty in walking, not elsewhere classified  Other abnormalities of gait and mobility  Other symptoms and signs involving the musculoskeletal system     Problem List Patient Active Problem List   Diagnosis Date Noted  . 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 Excessive sleepiness 10/31/2016  . Avitaminosis D 03/30/2016  . Idiopathic peripheral neuropathy 02/23/2016  . BP (high blood pressure) 02/22/2016  . Other fatigue 10/20/2015  . Trigeminal neuralgia 03/30/2015  . 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 Polypharmacy 09/11/2014  . Other long term (current) drug therapy 09/11/2014  . Arthritis or polyarthritis, rheumatoid ([place]) 05/14/2014  . Generalized OA 05/14/2014  . Rheumatoid arthritis ([place]) 05/14/2014  . Postop Hyponatremia 01/23/2013  . Postoperative anemia due to acute blood loss 01/23/2013  . Pain due to unicompartmental arthroplasty of knee ([place]) 01/22/2013  . Pain due to knee joint prosthesis ([place]) 01/22/2013  . Mucositis oral 08/20/2012     Lanney Gins, PT, DPT 04/29/18 11:57 AM   [facility] [address]  [place], [name], [address] Phone: [phone]   Fax:  [phone]  Name: Kristen Hansen MRN: [id number] Date of Birth: 07-29-1946

## 2018-05-02 ENCOUNTER — Encounter: Payer: Self-pay | Admitting: Physical Therapy

## 2018-05-02 ENCOUNTER — Ambulatory Visit: Payer: Medicare Other | Admitting: Physical Therapy

## 2018-05-02 DIAGNOSIS — M25561 Pain in right knee: Secondary | ICD-10-CM | POA: Diagnosis not present

## 2018-05-02 DIAGNOSIS — R262 Difficulty in walking, not elsewhere classified: Secondary | ICD-10-CM

## 2018-05-02 DIAGNOSIS — R2689 Other abnormalities of gait and mobility: Secondary | ICD-10-CM

## 2018-05-02 DIAGNOSIS — R29898 Other symptoms and signs involving the musculoskeletal system: Secondary | ICD-10-CM

## 2018-05-02 NOTE — Therapy (Signed)
[facility] [address]  [place], [name], [address] Phone: [phone]   Fax:  [phone]  Physical Therapy Treatment  Patient Details  Name: Kristen Hansen MRN: [id number] Date of Birth: 03-14-1946 Referring Provider: Dr. Gaynelle Arabian   Encounter Date: 05/02/2018  PT End of Session - 05/02/18 1319    Visit Number  21    Number of Visits  28    Date for PT Re-Evaluation  06/03/18    Authorization Type  Medicare    PT Start Time  5638    PT Stop Time  1356    PT Time Calculation (min)  39 min    Activity Tolerance  Patient tolerated treatment well    Behavior During Therapy  [facility] for tasks assessed/performed       Past Medical History:  Diagnosis Date  . Back pain   . Cancer (HCC)    Skin, basal cell  . Dyspnea   . Fatigue   . GERD (gastroesophageal reflux disease)   . Headache(784.0)    history of migraines  . History of kidney stones   . Hypercholesteremia   . Hypertension   . Leg swelling    Bilateral  . Occasional tremors    mild hand tremors  . Osteoarthritis   . PONV (postoperative nausea and vomiting)   . RBD (REM behavioral disorder)   . Rheumatoid arthritis(714.0)   . Scoliosis of lumbar spine   . Spinal stenosis    Multiple levels  . 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 Trigeminal neuralgia 03/30/2015  . Vision abnormalities     Past Surgical History:  Procedure Laterality Date  . ABDOMINAL HYSTERECTOMY    . AUGMENTATION MAMMAPLASTY Bilateral   . COLONOSCOPY    . JOINT REPLACEMENT     right uni knee, right hip with 2 revisions  . KIDNEY STONE SURGERY Bilateral   . KNEE ARTHROTOMY Right 02/27/2018   Procedure: RIGHT KNEE ARTHROTOMY; scar excision;  Surgeon: Gaynelle Arabian, MD;  Location: [facility];  Service: Orthopedics;  Laterality: Right;  . MOHS SURGERY    . TONSILLECTOMY    . TOTAL HIP ARTHROPLASTY    . TOTAL KNEE ARTHROPLASTY  01/22/2013   Procedure: TOTAL KNEE ARTHROPLASTY;  Surgeon: Gearlean Alf, MD;  Location: [facility];  Service: Orthopedics;  Laterality: Right;  Revision of a Right Uni Knee to a Total Knee Arthroplasty    There were no vitals filed for this visit.  Subjective Assessment - 05/02/18 1318    Subjective  doing well - some soreness in knee with active warm-up    Patient Stated Goals  improve walking and mobility    Currently in Pain?  No/denies    Pain Score  0-No pain                       [facility] Adult PT Treatment/Exercise - 05/02/18 0001      Knee/Hip Exercises: Aerobic   Nustep  L5 x 6 min      Knee/Hip Exercises: Machines for Strengthening   Cybex Leg Press  B LE - 35# x 20      Knee/Hip Exercises: Standing   Forward Lunges  Right;Left;10 reps fwd/bwd - 1 UE support    Hip Extension  Stengthening;Right;15 reps    Extension Limitations  fitter - 2 blue bands - B UE support    Functional Squat  20 reps TRX    Wall Squat  20 reps;3 seconds orange pball against wall      Knee/Hip Exercises: Seated   Sit to Sand  15 reps;with UE support;without UE support from box + AirEx               PT Short Term Goals - 03/22/18 0926      PT SHORT TERM GOAL #1   Title  patient to be independent with initial HEP    Status  Achieved      PT SHORT TERM GOAL #2   Title  patient to improve R knee PROM  0 - 90    Status  Achieved        PT Long Term Goals - 04/29/18 1135      PT LONG TERM GOAL #1   Title  patient to be independent with advanced HEP    Status  On-going      PT LONG TERM GOAL #2   Title  patient to improve R knee AROM 0-110    Status  Revised      PT LONG TERM GOAL #3   Title  patient to demonstrate proper heel toe gait mechanics without evidence of instability    Status  On-going      PT LONG TERM GOAL #4   Title  patient to improve R LE strength to >/= 4+/5    Status  On-going      PT LONG TERM GOAL #5   Title  patient to demonstrate reciprocal gait pattern up/down 1 flight of stairs without evidence of  instability    Status  On-going            Plan - 05/02/18 1319    Clinical Impression Statement  Patient doing ewll today - working on standing from low surfaces, as patient continues to report difficulty rising from low chair while gardening. Patient tolerable to all strengthening activities, but does need some verbal cueing for proper form and motor control. Making good progress towards goals.     PT Treatment/Interventions  ADLs/Self Care Home Management;Cryotherapy;Electrical Stimulation;Iontophoresis 4mg /ml Dexamethasone;Moist Heat;Ultrasound;Gait training;Stair training;Therapeutic activities;Therapeutic exercise;Balance training;Neuromuscular re-education;Patient/family education;Manual techniques;Functional mobility training;DME Instruction;Passive range of motion;Dry needling;Vasopneumatic Device;Taping    Consulted and Agree with Plan of Care  Patient       Patient will benefit from skilled therapeutic intervention in order to improve the following deficits and impairments:  Abnormal gait, Decreased activity tolerance, Decreased mobility, Decreased range of motion, Decreased strength, Difficulty walking, Increased muscle spasms, Increased fascial restricitons, Impaired flexibility, Pain  Visit Diagnosis: Acute pain of right knee  Difficulty in walking, not elsewhere classified  Other abnormalities of gait and mobility  Other symptoms and signs involving the musculoskeletal system     Problem List Patient Active Problem List   Diagnosis Date Noted  . 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 Excessive sleepiness 10/31/2016  . Avitaminosis D 03/30/2016  . Idiopathic peripheral neuropathy 02/23/2016  . BP (high blood pressure) 02/22/2016  . Other fatigue 10/20/2015  . Trigeminal neuralgia 03/30/2015  . 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 Polypharmacy 09/11/2014  . Other long term (current) drug therapy 09/11/2014  .  Arthritis or polyarthritis, rheumatoid ([place]) 05/14/2014  . Generalized OA 05/14/2014  . Rheumatoid arthritis ([place]) 05/14/2014  . Postop Hyponatremia 01/23/2013  . Postoperative anemia due to acute blood loss 01/23/2013  . Pain due to unicompartmental arthroplasty of knee ([place]) 01/22/2013  . Pain due to knee joint prosthesis ([place]) 01/22/2013  .  Mucositis oral 08/20/2012     Lanney Gins, PT, DPT 05/02/18 3:36 PM   [facility] [address]  [place], [name], [address] Phone: [phone]   Fax:  [phone]  Name: Kristen Hansen MRN: [id number] Date of Birth: Jun 19, 1946

## 2018-05-06 ENCOUNTER — Ambulatory Visit: Payer: Medicare Other | Admitting: Physical Therapy

## 2018-05-06 ENCOUNTER — Encounter: Payer: Self-pay | Admitting: Physical Therapy

## 2018-05-06 DIAGNOSIS — R29898 Other symptoms and signs involving the musculoskeletal system: Secondary | ICD-10-CM

## 2018-05-06 DIAGNOSIS — R262 Difficulty in walking, not elsewhere classified: Secondary | ICD-10-CM

## 2018-05-06 DIAGNOSIS — M25561 Pain in right knee: Secondary | ICD-10-CM

## 2018-05-06 DIAGNOSIS — R2689 Other abnormalities of gait and mobility: Secondary | ICD-10-CM

## 2018-05-06 NOTE — Therapy (Signed)
[facility] [address]  [place], [name], [address] Phone: [phone]   Fax:  [phone]  Physical Therapy Treatment  Patient Details  Name: Kristen Hansen MRN: [id number] Date of Birth: 11-16-1946 Referring Provider: Dr. Gaynelle Arabian   Encounter Date: 05/06/2018  PT End of Session - 05/06/18 1103    Visit Number  22    Number of Visits  28    Date for PT Re-Evaluation  06/03/18    Authorization Type  Medicare    PT Start Time  1100    PT Stop Time  1144    PT Time Calculation (min)  44 min    Activity Tolerance  Patient tolerated treatment well    Behavior During Therapy  [facility] for tasks assessed/performed       Past Medical History:  Diagnosis Date  . Back pain   . Cancer (HCC)    Skin, basal cell  . Dyspnea   . Fatigue   . GERD (gastroesophageal reflux disease)   . Headache(784.0)    history of migraines  . History of kidney stones   . Hypercholesteremia   . Hypertension   . Leg swelling    Bilateral  . Occasional tremors    mild hand tremors  . Osteoarthritis   . PONV (postoperative nausea and vomiting)   . RBD (REM behavioral disorder)   . Rheumatoid arthritis(714.0)   . Scoliosis of lumbar spine   . Spinal stenosis    Multiple levels  . 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 Trigeminal neuralgia 03/30/2015  . Vision abnormalities     Past Surgical History:  Procedure Laterality Date  . ABDOMINAL HYSTERECTOMY    . AUGMENTATION MAMMAPLASTY Bilateral   . COLONOSCOPY    . JOINT REPLACEMENT     right uni knee, right hip with 2 revisions  . KIDNEY STONE SURGERY Bilateral   . KNEE ARTHROTOMY Right 02/27/2018   Procedure: RIGHT KNEE ARTHROTOMY; scar excision;  Surgeon: Gaynelle Arabian, MD;  Location: [facility];  Service: Orthopedics;  Laterality: Right;  . MOHS SURGERY    . TONSILLECTOMY    . TOTAL HIP ARTHROPLASTY    . TOTAL KNEE ARTHROPLASTY  01/22/2013   Procedure: TOTAL KNEE ARTHROPLASTY;  Surgeon: Gearlean Alf, MD;  Location[facility];  Service: Orthopedics;  Laterality: Right;  Revision of a Right Uni Knee to a Total Knee Arthroplasty    There were no vitals filed for this visit.  Subjective Assessment - 05/06/18 1101    Subjective  doing ok - has been doing a good bit in her attic - concentrating on appropriate mechanics with foot going up/down stairs    Patient Stated Goals  improve walking and mobility    Currently in Pain?  No/denies    Pain Score  0-No pain                       [facility] Adult PT Treatment/Exercise - 05/06/18 1104      Knee/Hip Exercises: Aerobic   Recumbent Bike  L2 x 6 min      Knee/Hip Exercises: Standing   Wall Squat  20 reps orange pball at wall - target depth to PT thigh      Knee/Hip Exercises: Seated   Long Arc Quad  Strengthening;Right;15 reps;Weights + ball squeeze    Long Arc Quad Weight  4 lbs.    Other Seated Knee/Hip Exercises  Fitter - R LE -  2 blue/1 black x 15 reps      Manual Therapy   Manual Therapy  Soft tissue mobilization;Myofascial release;Joint mobilization    Manual therapy comments  patient supine    Joint Mobilization  patellar mobs - all planes; some hypomobility with sup/inf    Soft tissue mobilization  deep STM to R quad    Myofascial Release  manual trigger point release to R VL, rectus femoris       Trigger Point Dry Needling - 05/06/18 1235    Consent Given?  Yes    Muscles Treated Lower Body  Quadriceps    Quadriceps Response  Twitch response elicited;Palpable increased muscle length             PT Short Term Goals - 03/22/18 0926      PT SHORT TERM GOAL #1   Title  patient to be independent with initial HEP    Status  Achieved      PT SHORT TERM GOAL #2   Title  patient to improve R knee PROM  0 - 90    Status  Achieved        PT Long Term Goals - 04/29/18 1135      PT LONG TERM GOAL #1   Title  patient to be independent with advanced HEP    Status  On-going      PT LONG TERM GOAL  #2   Title  patient to improve R knee AROM 0-110    Status  Revised      PT LONG TERM GOAL #3   Title  patient to demonstrate proper heel toe gait mechanics without evidence of instability    Status  On-going      PT LONG TERM GOAL #4   Title  patient to improve R LE strength to >/= 4+/5    Status  On-going      PT LONG TERM GOAL #5   Title  patient to demonstrate reciprocal gait pattern up/down 1 flight of stairs without evidence of instability    Status  On-going            Plan - 05/06/18 1103    Clinical Impression Statement  Kristen Hansen doing well - noting improved ability to rise from toilet in that past days as well as ability to climb up/down stairs to attic focusing on improved gait mechanics. DN today with good twitch respsonse as well as improved quad activation following DN/manual treatment to R quad mm. Patient to continue to progress towards goals.     PT Treatment/Interventions  ADLs/Self Care Home Management;Cryotherapy;Electrical Stimulation;Iontophoresis 4mg /ml Dexamethasone;Moist Heat;Ultrasound;Gait training;Stair training;Therapeutic activities;Therapeutic exercise;Balance training;Neuromuscular re-education;Patient/family education;Manual techniques;Functional mobility training;DME Instruction;Passive range of motion;Dry needling;Vasopneumatic Device;Taping    Consulted and Agree with Plan of Care  Patient       Patient will benefit from skilled therapeutic intervention in order to improve the following deficits and impairments:  Abnormal gait, Decreased activity tolerance, Decreased mobility, Decreased range of motion, Decreased strength, Difficulty walking, Increased muscle spasms, Increased fascial restricitons, Impaired flexibility, Pain  Visit Diagnosis: Acute pain of right knee  Difficulty in walking, not elsewhere classified  Other abnormalities of gait and mobility  Other symptoms and signs involving the musculoskeletal system     Problem  List Patient Active Problem List   Diagnosis Date Noted  . 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 Excessive sleepiness 10/31/2016  . Avitaminosis D 03/30/2016  . Idiopathic peripheral neuropathy 02/23/2016  .  BP (high blood pressure) 02/22/2016  . Other fatigue 10/20/2015  . Trigeminal neuralgia 03/30/2015  . 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 Polypharmacy 09/11/2014  . Other long term (current) drug therapy 09/11/2014  . Arthritis or polyarthritis, rheumatoid ([place]) 05/14/2014  . Generalized OA 05/14/2014  . Rheumatoid arthritis ([place]) 05/14/2014  . Postop Hyponatremia 01/23/2013  . Postoperative anemia due to acute blood loss 01/23/2013  . Pain due to unicompartmental arthroplasty of knee ([place]) 01/22/2013  . Pain due to knee joint prosthesis ([place]) 01/22/2013  . Mucositis oral 08/20/2012     Lanney Gins, PT, DPT 05/06/18 12:39 PM   [facility] [address]  [place], [name], [address] Phone: [phone]   Fax:  [phone]  Name: Kristen Hansen MRN: [id number] Date of Birth: 1946/05/26

## 2018-05-09 ENCOUNTER — Ambulatory Visit: Payer: Medicare Other | Admitting: Physical Therapy

## 2018-05-22 ENCOUNTER — Ambulatory Visit: Payer: Medicare Other

## 2018-05-22 DIAGNOSIS — M25561 Pain in right knee: Secondary | ICD-10-CM

## 2018-05-22 DIAGNOSIS — R29898 Other symptoms and signs involving the musculoskeletal system: Secondary | ICD-10-CM

## 2018-05-22 DIAGNOSIS — R2689 Other abnormalities of gait and mobility: Secondary | ICD-10-CM

## 2018-05-22 DIAGNOSIS — R262 Difficulty in walking, not elsewhere classified: Secondary | ICD-10-CM

## 2018-05-22 NOTE — Therapy (Signed)
[facility] [address]  [place], [name], [address] Phone: [phone]   Fax:  [phone]  Physical Therapy Treatment  Patient Details  Name: Kristen Hansen MRN: [id number] Date of Birth: 12-20-1946 Referring Provider: Dr. Gaynelle Arabian   Encounter Date: 05/22/2018  PT End of Session - 05/22/18 1320    Visit Number  23    Number of Visits  28    Date for PT Re-Evaluation  06/03/18    Authorization Type  Medicare    PT Start Time  1316    PT Stop Time  1356    PT Time Calculation (min)  40 min    Activity Tolerance  Patient tolerated treatment well    Behavior During Therapy  [facility] for tasks assessed/performed       Past Medical History:  Diagnosis Date  . Back pain   . Cancer (HCC)    Skin, basal cell  . Dyspnea   . Fatigue   . GERD (gastroesophageal reflux disease)   . Headache(784.0)    history of migraines  . History of kidney stones   . Hypercholesteremia   . Hypertension   . Leg swelling    Bilateral  . Occasional tremors    mild hand tremors  . Osteoarthritis   . PONV (postoperative nausea and vomiting)   . RBD (REM behavioral disorder)   . Rheumatoid arthritis(714.0)   . Scoliosis of lumbar spine   . Spinal stenosis    Multiple levels  . 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 Trigeminal neuralgia 03/30/2015  . Vision abnormalities     Past Surgical History:  Procedure Laterality Date  . ABDOMINAL HYSTERECTOMY    . AUGMENTATION MAMMAPLASTY Bilateral   . COLONOSCOPY    . JOINT REPLACEMENT     right uni knee, right hip with 2 revisions  . KIDNEY STONE SURGERY Bilateral   . KNEE ARTHROTOMY Right 02/27/2018   Procedure: RIGHT KNEE ARTHROTOMY; scar excision;  Surgeon: Gaynelle Arabian, MD;  [facility]: [facility];  Service: Orthopedics;  Laterality: Right;  . MOHS SURGERY    . TONSILLECTOMY    . TOTAL HIP ARTHROPLASTY    . TOTAL KNEE ARTHROPLASTY  01/22/2013   Procedure: TOTAL KNEE ARTHROPLASTY;  Surgeon: Gearlean Alf, MD;  [facility]: [facility];  Service: Orthopedics;  Laterality: Right;  Revision of a Right Uni Knee to a Total Knee Arthroplasty    There were no vitals filed for this visit.  Subjective Assessment - 05/22/18 1318    Subjective  Pt. noting she felt some benefit from DN last visit.  Pt. had recent MD f/u with MD pleased with motion.  To see MD for next f/u in three months.      Patient Stated Goals  improve walking and mobility    Currently in Pain?  No/denies    Pain Score  0-No pain    Multiple Pain Sites  No                       [facility] Adult PT Treatment/Exercise - 05/22/18 1328      Ambulation/Gait   Stairs  Yes    Stairs Assistance  6: Modified independent (Device/Increase time)    Stair Management Technique  One rail Right;Alternating pattern    Gait Comments  Good overall stability with only slight R quad eccentric weakness noted.        Knee/Hip Exercises: Aerobic   Nustep  L5 x 6 min      Knee/Hip Exercises: Machines for Strengthening   Cybex Knee Extension  B LE - 25# x 20    Cybex Knee Flexion  B con/R ecc - 25# x 20      Knee/Hip Exercises: Standing   Functional Squat  20 reps    Functional Squat Limitations  TRX + heel raise at bottom of movement       Knee/Hip Exercises: Seated   Long Arc Quad  Strengthening;Right;Weights;20 reps    Long Arc Quad Weight  4 lbs.    Sit to Sand  15 reps;with UE support;without UE support 2 UE assist from therapist                PT Short Term Goals - 03/22/18 0926      PT SHORT TERM GOAL #1   Title  patient to be independent with initial HEP    Status  Achieved      PT SHORT TERM GOAL #2   Title  patient to improve R knee PROM  0 - 90    Status  Achieved        PT Long Term Goals - 04/29/18 1135      PT LONG TERM GOAL #1   Title  patient to be independent with advanced HEP    Status  On-going      PT LONG TERM GOAL #2   Title  patient to improve R knee AROM 0-110    Status  Revised       PT LONG TERM GOAL #3   Title  patient to demonstrate proper heel toe gait mechanics without evidence of instability    Status  On-going      PT LONG TERM GOAL #4   Title  patient to improve R LE strength to >/= 4+/5    Status  On-going      PT LONG TERM GOAL #5   Title  patient to demonstrate reciprocal gait pattern up/down 1 flight of stairs without evidence of instability    Status  On-going            Plan - 05/22/18 1322    Clinical Impression Statement  Kristen Hansen noting she still has difficulty rising from low surfaces however, reports she is having greater ease navigating stairs reciprocally at home.  Treatment focused on LE strengthening activities to promote greater ease in rising from low surfaces such as couches and low toilets.  Pt. verbalized fatigue following LE strengthening however tolerated all activities well in treatment with some progression in reps and difficulty.  Will continue to progress toward goals.      PT Treatment/Interventions  ADLs/Self Care Home Management;Cryotherapy;Electrical Stimulation;Iontophoresis 4mg /ml Dexamethasone;Moist Heat;Ultrasound;Gait training;Stair training;Therapeutic activities;Therapeutic exercise;Balance training;Neuromuscular re-education;Patient/family education;Manual techniques;Functional mobility training;DME Instruction;Passive range of motion;Dry needling;Vasopneumatic Device;Taping    Consulted and Agree with Plan of Care  Patient       Patient will benefit from skilled therapeutic intervention in order to improve the following deficits and impairments:  Abnormal gait, Decreased activity tolerance, Decreased mobility, Decreased range of motion, Decreased strength, Difficulty walking, Increased muscle spasms, Increased fascial restricitons, Impaired flexibility, Pain  Visit Diagnosis: Acute pain of right knee  Difficulty in walking, not elsewhere classified  Other abnormalities of gait and mobility  Other symptoms and  signs involving the musculoskeletal system     Problem List Patient Active Problem List   Diagnosis Date Noted  . 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 Excessive sleepiness 10/31/2016  . Avitaminosis D 03/30/2016  . Idiopathic peripheral neuropathy 02/23/2016  . BP (high blood pressure) 02/22/2016  . Other fatigue 10/20/2015  . Trigeminal neuralgia 03/30/2015  . 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 Polypharmacy 09/11/2014  . Other long term (current) drug therapy 09/11/2014  . Arthritis or polyarthritis, rheumatoid ([place]) 05/14/2014  . Generalized OA 05/14/2014  . Rheumatoid arthritis ([place]) 05/14/2014  . Postop Hyponatremia 01/23/2013  . Postoperative anemia due to acute blood loss 01/23/2013  . Pain due to unicompartmental arthroplasty of knee ([place]) 01/22/2013  . Pain due to knee joint prosthesis ([place]) 01/22/2013  . Mucositis oral 08/20/2012   Kristen Hansen, Kristen Hansen 05/22/18 3:30 PM   [facility] [address]  [place], [name], [address] Phone: [phone]   Fax:  [phone]  Name: Kristen Hansen MRN: [id number] Date of Birth: 08-26-1946

## 2018-05-23 ENCOUNTER — Encounter: Payer: Self-pay | Admitting: Physical Therapy

## 2018-05-23 ENCOUNTER — Ambulatory Visit: Payer: Medicare Other | Admitting: Physical Therapy

## 2018-05-23 DIAGNOSIS — R262 Difficulty in walking, not elsewhere classified: Secondary | ICD-10-CM

## 2018-05-23 DIAGNOSIS — M25561 Pain in right knee: Secondary | ICD-10-CM

## 2018-05-23 DIAGNOSIS — R29898 Other symptoms and signs involving the musculoskeletal system: Secondary | ICD-10-CM

## 2018-05-23 DIAGNOSIS — R2689 Other abnormalities of gait and mobility: Secondary | ICD-10-CM

## 2018-05-23 NOTE — Therapy (Addendum)
[facility] [address]  [place], [name], [address] Phone: [phone]   Fax:  [phone]  Physical Therapy Treatment  Patient Details  Name: Kristen Hansen MRN: [id number] Date of Birth: 1946/04/22 Referring Provider: Dr. Gaynelle Arabian   Encounter Date: 05/23/2018  PT End of Session - 05/23/18 1751    Visit Number  24    Number of Visits  28    Date for PT Re-Evaluation  06/03/18    Authorization Type  Medicare    PT Start Time  6759    PT Stop Time  1615    PT Time Calculation (min)  42 min    Activity Tolerance  Patient tolerated treatment well    Behavior During Therapy  [facility] for tasks assessed/performed       Past Medical History:  Diagnosis Date  . Back pain   . Cancer (HCC)    Skin, basal cell  . Dyspnea   . Fatigue   . GERD (gastroesophageal reflux disease)   . Headache(784.0)    history of migraines  . History of kidney stones   . Hypercholesteremia   . Hypertension   . Leg swelling    Bilateral  . Occasional tremors    mild hand tremors  . Osteoarthritis   . PONV (postoperative nausea and vomiting)   . RBD (REM behavioral disorder)   . Rheumatoid arthritis(714.0)   . Scoliosis of lumbar spine   . Spinal stenosis    Multiple levels  . 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 Trigeminal neuralgia 03/30/2015  . Vision abnormalities     Past Surgical History:  Procedure Laterality Date  . ABDOMINAL HYSTERECTOMY    . AUGMENTATION MAMMAPLASTY Bilateral   . COLONOSCOPY    . JOINT REPLACEMENT     right uni knee, right hip with 2 revisions  . KIDNEY STONE SURGERY Bilateral   . KNEE ARTHROTOMY Right 02/27/2018   Procedure: RIGHT KNEE ARTHROTOMY; scar excision;  Surgeon: Gaynelle Arabian, MD;  Location: [facility];  Service: Orthopedics;  Laterality: Right;  . MOHS SURGERY    . TONSILLECTOMY    . TOTAL HIP ARTHROPLASTY    . TOTAL KNEE ARTHROPLASTY  01/22/2013   Procedure: TOTAL KNEE ARTHROPLASTY;  Surgeon: Gearlean Alf, MD;  Location: [facility];  Service: Orthopedics;  Laterality: Right;  Revision of a Right Uni Knee to a Total Knee Arthroplasty    There were no vitals filed for this visit.  Subjective Assessment - 05/23/18 1750    Subjective  doing well - feels like L leg is most bothersome today    Patient Stated Goals  improve walking and mobility    Currently in Pain?  No/denies                       [facility] [address] Adult PT Treatment/Exercise - 05/23/18 0001      Knee/Hip Exercises: Aerobic   Recumbent Bike  L2 x 6 min      Knee/Hip Exercises: Standing   Lateral Step Up  Right;Left;10 reps;Hand Hold: 0;Step Height: 8"    Forward Step Up  Right;15 reps;Hand Hold: 0;Step Height: 8"    Step Down  Right;20 reps;Hand Hold: 2;Step Height: 6"    Functional Squat  15 reps    Functional Squat Limitations  TRX    Wall Squat  20 reps target depth to PT thigh  PT Short Term Goals - 03/22/18 3532      PT SHORT TERM GOAL #1   Title  patient to be independent with initial HEP    Status  Achieved      PT SHORT TERM GOAL #2   Title  patient to improve R knee PROM  0 - 90    Status  Achieved        PT Long Term Goals - 05/23/18 1751      PT LONG TERM GOAL #1   Title  patient to be independent with advanced HEP    Status  Achieved      PT LONG TERM GOAL #2   Title  patient to improve R knee AROM 0-110    Status  Partially Met      PT LONG TERM GOAL #3   Title  patient to demonstrate proper heel toe gait mechanics without evidence of instability    Status  Partially Met      PT LONG TERM GOAL #4   Title  patient to improve R LE strength to >/= 4+/5    Status  Achieved      PT LONG TERM GOAL #5   Title  patient to demonstrate reciprocal gait pattern up/down 1 flight of stairs without evidence of instability    Status  Partially Met            Plan - 05/23/18 1752    Clinical Impression Statement  Kristen Hansen doing really well today - Much improved  gait, stbaility, and general strength and function noted at R LE. Does have some L LE pain today of unknown reasoning. Able to go up/down forward and laterally 8" step without UE support. Seen by Dr. Wynelle Link last week with good report - discussion today on transitioning to HEP at this time as patient is very active and can safely transition to HEP. WIll plan to place patient on 30 day hold at this time.     PT Treatment/Interventions  ADLs/Self Care Home Management;Cryotherapy;Electrical Stimulation;Iontophoresis 9m/ml Dexamethasone;Moist Heat;Ultrasound;Gait training;Stair training;Therapeutic activities;Therapeutic exercise;Balance training;Neuromuscular re-education;Patient/family education;Manual techniques;Functional mobility training;DME Instruction;Passive range of motion;Dry needling;Vasopneumatic Device;Taping    Consulted and Agree with Plan of Care  Patient       Patient will benefit from skilled therapeutic intervention in order to improve the following deficits and impairments:  Abnormal gait, Decreased activity tolerance, Decreased mobility, Decreased range of motion, Decreased strength, Difficulty walking, Increased muscle spasms, Increased fascial restricitons, Impaired flexibility, Pain  Visit Diagnosis: Acute pain of right knee  Difficulty in walking, not elsewhere classified  Other abnormalities of gait and mobility  Other symptoms and signs involving the musculoskeletal system     Problem List Patient Active Problem List   Diagnosis Date Noted  . 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 Excessive sleepiness 10/31/2016  . Avitaminosis D 03/30/2016  . Idiopathic peripheral neuropathy 02/23/2016  . BP (high blood pressure) 02/22/2016  . Other fatigue 10/20/2015  . Trigeminal neuralgia 03/30/2015  . 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 Polypharmacy 09/11/2014  . Other long term (current) drug therapy 09/11/2014  . Arthritis  or polyarthritis, rheumatoid ([place] 05/14/2014  . Generalized OA 05/14/2014  . Rheumatoid arthritis ([place] 05/14/2014  . Postop Hyponatremia 01/23/2013  . Postoperative anemia due to acute blood loss 01/23/2013  . Pain due to unicompartmental arthroplasty of knee ([place] 01/22/2013  . Pain due to knee joint prosthesis ([place] 01/22/2013  . Mucositis oral 08/20/2012  Lanney Gins, PT, DPT 05/23/18 5:55 PM   [facility] [address]  [place], [name], [address] Phone: [phone]   Fax:  [phone]  Name: Kristen Hansen MRN: [id number] Date of Birth: 1946-04-16    PHYSICAL THERAPY DISCHARGE SUMMARY  Visits from Start of Care: 24  Current functional level related to goals / functional outcomes:   Refer to above clinical impression for status as of last visit on 05/23/18. Pt was placed on hold for 30 days and has not needed to return to PT, therefore will proceed with discharge from PT for this episode.   Remaining deficits:   As above.   Education / Equipment:   HEP  Plan: Patient agrees to discharge.  Patient goals were partially met. Patient is being discharged due to being pleased with the current functional level.  ?????     Percival Spanish, PT, MPT 07/09/18, 5:49 PM  [facility] [address]  [place], [name], [address] Phone: [phone]   Fax:  [phone]

## 2018-05-29 ENCOUNTER — Ambulatory Visit: Payer: Medicare Other

## 2018-06-03 ENCOUNTER — Encounter: Payer: Medicare Other | Admitting: Physical Therapy

## 2018-06-06 ENCOUNTER — Encounter: Payer: Medicare Other | Admitting: Physical Therapy

## 2018-06-07 ENCOUNTER — Other Ambulatory Visit: Payer: Self-pay | Admitting: Neurology

## 2018-06-17 ENCOUNTER — Other Ambulatory Visit: Payer: Self-pay | Admitting: Neurology

## 2018-08-01 ENCOUNTER — Telehealth: Payer: Self-pay | Admitting: Neurology

## 2018-08-01 NOTE — Telephone Encounter (Signed)
LMTC./fim 

## 2018-08-01 NOTE — Telephone Encounter (Signed)
Pt requesting a call to discuss medication management for her facial ticks. Please call to advise

## 2018-08-02 NOTE — Telephone Encounter (Signed)
Left second message requesting a return call. 

## 2018-08-05 NOTE — Telephone Encounter (Signed)
Spoke with Ivin Booty. She had facial tics last wk; on vacation (out of town) and was afraid this indicated a coming exacerbation of trigeminal neuralgia, so she increased Lamctal from 50mg  QID to 100mg  TID, and continues Oxcarbazepine 300mg  TID. Facial tics have resolved. Per v/o RAS, ok to continue Lamictal and Oxcarbazepine at current dose, call back if sx. worsen again. She verbalized understanding of same/fim

## 2018-09-05 ENCOUNTER — Encounter: Payer: Self-pay | Admitting: Neurology

## 2018-09-05 ENCOUNTER — Other Ambulatory Visit: Payer: Self-pay

## 2018-09-05 ENCOUNTER — Ambulatory Visit: Payer: Medicare Other | Admitting: Neurology

## 2018-09-05 VITALS — BP 121/69 | HR 99 | Resp 20 | Ht 69.5 in | Wt 187.0 lb

## 2018-09-05 DIAGNOSIS — M47899 Other spondylosis, site unspecified: Secondary | ICD-10-CM

## 2018-09-05 DIAGNOSIS — M545 Low back pain, unspecified: Secondary | ICD-10-CM | POA: Insufficient documentation

## 2018-09-05 DIAGNOSIS — G8929 Other chronic pain: Secondary | ICD-10-CM

## 2018-09-05 DIAGNOSIS — M47819 Spondylosis without myelopathy or radiculopathy, site unspecified: Secondary | ICD-10-CM

## 2018-09-05 DIAGNOSIS — G500 Trigeminal neuralgia: Secondary | ICD-10-CM

## 2018-09-05 DIAGNOSIS — M069 Rheumatoid arthritis, unspecified: Secondary | ICD-10-CM

## 2018-09-05 MED ORDER — CLONAZEPAM 1 MG PO TABS: 1.0000 mg | ORAL_TABLET | Freq: Every day | ORAL | 5 refills | Status: DC
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
## 2018-09-05 NOTE — Patient Instructions (Signed)
1.    Increase the oxcarbazepine to 4 pills a day. 2.    We will set up a medial branch block to see if that helps your back pain.  If it does, you could be a candidate for radiofrequency ablation that burns away the little n as well erves that go to the little joints in the spine.   Those joints can cause a lot of pain and yours are enlarged.  Call us 3.    I will increase the nighttime clonazepam to 1 mg from half a milligram to see if that helps the dream behavior better.

## 2018-09-09 ENCOUNTER — Telehealth: Payer: Self-pay | Admitting: *Deleted

## 2018-09-09 MED ORDER — OXCARBAZEPINE 300 MG PO TABS: ORAL_TABLET | ORAL | 3 refills | Status: DC

## 2018-09-09 NOTE — Telephone Encounter (Signed)
Trileptal escribed to [employer] in response to faxed request from them/fim

## 2018-09-13 ENCOUNTER — Telehealth: Payer: Self-pay | Admitting: Neurology

## 2018-09-13 NOTE — Telephone Encounter (Signed)
I left a message for hearing to discuss the medial branch blocks/RFA.  Dr. 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 Nelva Bush) that we may want to go ahead and have her proceed with surgery.  She has spoken to neurosurgery in the past and they also recommended fusion.

## 2018-09-23 NOTE — Telephone Encounter (Addendum)
Patient returning a call to discuss medial branch blocks. She can be reached at [phone].

## 2018-09-23 NOTE — Telephone Encounter (Signed)
Spoke with Ivin Booty and reviewed Dr. Garth Bigness phone call from 2 wks. ago. NS (Dr. Jola Baptist) did not feel MBB, RF would be helpful for her.  They rec. she revisit surgery.  She has seen Dr. Ronnald Ramp in the past.  She is established at [facility], so she just needs to make and appt. with him to discuss.  She verbalized understanding, asks what would the procedure be if she wants a 2nd opinion.  I advised it is completely reasonable and her right to seek a 2nd opinion. We can refer her to whomever she would like to see/fim

## 2018-09-25 ENCOUNTER — Other Ambulatory Visit: Payer: Self-pay | Admitting: Neurology

## 2018-10-01 ENCOUNTER — Other Ambulatory Visit: Payer: Self-pay | Admitting: Internal Medicine

## 2018-10-01 DIAGNOSIS — Z1231 Encounter for screening mammogram for malignant neoplasm of breast: Secondary | ICD-10-CM

## 2018-10-17 ENCOUNTER — Inpatient Hospital Stay: Admission: RE | Admit: 2018-10-17 | Payer: Medicare Other | Source: Ambulatory Visit

## 2018-10-17 ENCOUNTER — Telehealth: Payer: Self-pay | Admitting: Neurology

## 2018-10-17 NOTE — Telephone Encounter (Signed)
Heather from the patients PCP wanted a message sent over letting Dr. Felecia Shelling know that Dr. Matilde Haymaker started her on Cymbalta this week for depression and pain. The patient also had a neurosurgery consult and they would like to know if someone can follow up on having a second opinion for the neurosurgery. Please call and advise.

## 2018-10-18 NOTE — Telephone Encounter (Signed)
[facility] patient had an apt and she CX it . I called and left patient a message to call [facility] and get her apt rescheduled at her convince [phone]. Thanks Hinton Dyer.

## 2018-11-09 ENCOUNTER — Encounter (HOSPITAL_BASED_OUTPATIENT_CLINIC_OR_DEPARTMENT_OTHER): Payer: Self-pay | Admitting: Emergency Medicine

## 2018-11-09 ENCOUNTER — Emergency Department (HOSPITAL_BASED_OUTPATIENT_CLINIC_OR_DEPARTMENT_OTHER): Payer: Medicare Other

## 2018-11-09 ENCOUNTER — Emergency Department (HOSPITAL_BASED_OUTPATIENT_CLINIC_OR_DEPARTMENT_OTHER)
Admission: EM | Admit: 2018-11-09 | Discharge: 2018-11-09 | Disposition: A | Discharge status: Home / Self Care | Payer: Medicare Other | Attending: Emergency Medicine | Admitting: Emergency Medicine

## 2018-11-09 ENCOUNTER — Other Ambulatory Visit: Payer: Self-pay

## 2018-11-09 DIAGNOSIS — Y92009 Unspecified place in unspecified non-institutional (private) residence as the place of occurrence of the external cause: Secondary | ICD-10-CM | POA: Diagnosis not present

## 2018-11-09 DIAGNOSIS — W19XXXA Unspecified fall, initial encounter: Secondary | ICD-10-CM

## 2018-11-09 DIAGNOSIS — Y9301 Activity, walking, marching and hiking: Secondary | ICD-10-CM | POA: Insufficient documentation

## 2018-11-09 DIAGNOSIS — Z79899 Other long term (current) drug therapy: Secondary | ICD-10-CM | POA: Insufficient documentation

## 2018-11-09 DIAGNOSIS — W010XXA Fall on same level from slipping, tripping and stumbling without subsequent striking against object, initial encounter: Secondary | ICD-10-CM | POA: Insufficient documentation

## 2018-11-09 DIAGNOSIS — S0003XA Contusion of scalp, initial encounter: Secondary | ICD-10-CM | POA: Diagnosis not present

## 2018-11-09 DIAGNOSIS — I10 Essential (primary) hypertension: Secondary | ICD-10-CM | POA: Diagnosis not present

## 2018-11-09 DIAGNOSIS — Y998 Other external cause status: Secondary | ICD-10-CM | POA: Diagnosis not present

## 2018-11-09 DIAGNOSIS — S0990XA Unspecified injury of head, initial encounter: Secondary | ICD-10-CM | POA: Diagnosis present

## 2018-11-09 NOTE — Discharge Instructions (Addendum)
Take Tylenol as needed for pain. Take your home muscle relaxer as needed for neck stiffness/soreness. Use ice packs to help with pain and swelling. Follow-up with your primary care doctor as needed for further evaluation of your pain. Return to the emergency room if you develop vision changes, severe worsening headache, vomiting, dizziness, numbness, or any new or concerning symptoms.

## 2018-11-09 NOTE — ED Provider Notes (Signed)
Medical screening examination/treatment/procedure(s) were conducted as a shared visit with non-physician practitioner(s) and myself.  I personally evaluated the patient during the encounter.  None Patient reports he had a mechanical fall and fell backwards striking the back of her head.  Reports she kind of bounced a little bit and did a double hit.  There was no loss of consciousness.  She reports she did have a pretty big hematoma on the back of her head but reports is getting smaller now.  She denies any nausea or vomiting.  No visual changes.  No confusion.  She reports she has some "mantle-like stiffness across the back of the neck on the sides and across the tops of her shoulders.  She denies any paresthesia weakness numbness or tingling to the extremities.  Patient is alert and appropriate.  She has approximately 3 cm hematoma to the back of the head without laceration.  Movements are coordinated purposeful symmetric.  She has mild to moderate discomfort along the paraspinous and paracervical muscle bodies bilaterally but no midline tenderness.  CT shows no acute intracranial hemorrhage.  I agree with plan of management.  I have reviewed return precautions.  Patient does have Robaxin at home to take for muscle relaxer as needed.   Charlesetta Shanks, MD 11/09/18 1254

## 2018-11-09 NOTE — ED Provider Notes (Signed)
[facility] EMERGENCY DEPARTMENT Provider Note   CSN: [id number] Arrival date & time: 11/09/18  1050     History   Chief Complaint Chief Complaint  Patient presents with  . Fall    HPI Kristen Hansen is a 72 y.o. female presenting for evaluation after a fall.  Patient states she had a mechanical fall during which she turned and tripped over a chair.  She fell backwards hitting the left side of her head.  She denies loss of consciousness.  She is not on blood thinners.  She has been able to ambulate since without difficulty.  Patient reports pain of the left side of her head and soreness across her upper back/shoulders.  She has not taken anything for pain.  She denies vision changes, slurred speech, central neck pain, new back pain, numbness/tingling, weakness.  She denies chest pain, shortness of breath, nausea, vomiting, abdominal pain, or loss of bowel bladder control.  HPI  Past Medical History:  Diagnosis Date  . Back pain   . Cancer (HCC)    Skin, basal cell  . Dyspnea   . Fatigue   . GERD (gastroesophageal reflux disease)   . Headache(784.0)    history of migraines  . History of kidney stones   . Hypercholesteremia   . Hypertension   . Leg swelling    Bilateral  . Occasional tremors    mild hand tremors  . Osteoarthritis   . PONV (postoperative nausea and vomiting)   . RBD (REM behavioral disorder)   . Rheumatoid arthritis(714.0)   . Scoliosis of lumbar spine   . Spinal stenosis    Multiple levels  . 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 Trigeminal neuralgia 03/30/2015  . Vision abnormalities     Patient Active Problem List   Diagnosis Date Noted  . Chronic low back pain 09/05/2018  . 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 Excessive sleepiness 10/31/2016  . Avitaminosis D 03/30/2016  . Idiopathic peripheral neuropathy 02/23/2016  . BP (high blood pressure) 02/22/2016  . Other fatigue 10/20/2015  . Trigeminal neuralgia  03/30/2015  . 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 Polypharmacy 09/11/2014  . Other long term (current) drug therapy 09/11/2014  . Arthritis or polyarthritis, rheumatoid ([place]) 05/14/2014  . Generalized OA 05/14/2014  . Rheumatoid arthritis ([place]) 05/14/2014  . Postop Hyponatremia 01/23/2013  . Postoperative anemia due to acute blood loss 01/23/2013  . Pain due to unicompartmental arthroplasty of knee ([place]) 01/22/2013  . Pain due to knee joint prosthesis ([place]) 01/22/2013  . Mucositis oral 08/20/2012    Past Surgical History:  Procedure Laterality Date  . ABDOMINAL HYSTERECTOMY    . AUGMENTATION MAMMAPLASTY Bilateral   . COLONOSCOPY    . JOINT REPLACEMENT     right uni knee, right hip with 2 revisions  . KIDNEY STONE SURGERY Bilateral   . KNEE ARTHROTOMY Right 02/27/2018   Procedure: RIGHT KNEE ARTHROTOMY; scar excision;  Surgeon: Gaynelle Arabian, MD;  Location: [facility];  Service: Orthopedics;  Laterality: Right;  . MOHS SURGERY    . TONSILLECTOMY    . TOTAL HIP ARTHROPLASTY    . TOTAL KNEE ARTHROPLASTY  01/22/2013   Procedure: TOTAL KNEE ARTHROPLASTY;  Surgeon: Gearlean Alf, MD;  Location: [facility];  Service: Orthopedics;  Laterality: Right;  Revision of a Right Uni Knee to a Total Knee Arthroplasty     OB History   None      Home Medications  Prior to Admission medications   Medication Sig Start Date End Date Taking? Authorizing Provider  amLODipine-olmesartan (AZOR) 5-40 MG per tablet Take 1 tablet by mouth every evening.     [provider]  aspirin EC 325 MG tablet Take 1 tablet (325 mg total) by mouth daily. Take a full dose Aspirin 325 mg daily for three weeks, then reduce back to the home baby 81 mg Aspirin daily. 02/28/18   Perkins, Alexzandrew L, PA-C  cevimeline (EVOXAC) 30 MG capsule cevimeline 30 mg capsule 02/29/16   [provider]  clonazePAM (KLONOPIN) 1 MG tablet Take 1 tablet (1 mg total) by mouth at bedtime. 09/05/18   Sater, Nanine Means, MD    Docusate Calcium (STOOL SOFTENER PO) Take 1 tablet by mouth daily.    [provider]  ENBREL SURECLICK 50 MG/ML injection INJECT 1ML UNDER THE SKIN WEEKLY 07/30/18   [provider]  etodolac (LODINE) 400 MG tablet Take 400 mg by mouth 2 (two) times daily. 08/20/18   [provider]  fluticasone furoate-vilanterol (BREO ELLIPTA) 100-25 MCG/INH AEPB Inhale 1 puff into the lungs daily.    [provider]  imipramine (TOFRANIL) 25 MG tablet TAKE ONE TABLET BY MOUTH AT BEDTIME 11/27/17   Sater, Nanine Means, MD  lamoTRIgine (LAMICTAL) 100 MG tablet TAKE 1 TABLET BY MOUTH TWICE DAILY 09/25/18   Sater, Nanine Means, MD  loratadine (CLARITIN) 10 MG tablet Take 10 mg by mouth daily.    [provider]  methocarbamol (ROBAXIN) 500 MG tablet Take 1 tablet (500 mg total) by mouth every 6 (six) hours as needed for muscle spasms. 02/28/18   Perkins, Alexzandrew L, PA-C  mometasone (NASONEX) 50 MCG/ACT nasal spray Place 1 spray into the nose daily as needed (CONGESTION).     [provider]  montelukast (SINGULAIR) 10 MG tablet Take 10 mg by mouth at bedtime.    [provider]  omeprazole (PRILOSEC) 20 MG capsule Take 20 mg by mouth daily.     [provider]  Oxcarbazepine (TRILEPTAL) 300 MG tablet Take 1 tablet in the morning, 1 at noon, and 2 at bedtime. Needs appt. prior to future refills. Please call the office at [phone] 09/09/18   Britt Bottom, MD  oxyCODONE (OXY IR/ROXICODONE) 5 MG immediate release tablet Take 1-2 tablets (5-10 mg total) by mouth every 4 (four) hours as needed for moderate pain or severe pain. 02/28/18   Perkins, Alexzandrew L, PA-C  Polyvinyl Alcohol-Povidone (REFRESH OP) Apply 1 drop to eye daily as needed (DRY EYES).    [provider]  pravastatin (PRAVACHOL) 40 MG tablet Take 40 mg by mouth daily before breakfast.     [provider]  Turmeric 500 MG CAPS Take 1,000 mg by mouth daily.    [provider]  valACYclovir (VALTREX) 500 MG tablet TAKE 2 TABLETS BY MOUTH EVERY 12 HOURS AS NEEDED FOR FEVER BLISTERS    [provider]    Family History Family History  Problem Relation Age of Onset  . Heart disease Mother   . Parkinson's disease Father     Social History Social History   Tobacco Use  . Smoking status: Never Smoker  . Smokeless tobacco: Never Used  Substance Use Topics  . Alcohol use: Yes    Alcohol/week: 0.0 standard drinks    Comment: occasional  . Drug use: No     Allergies   Morphine and related; Prevacid [lansoprazole]; Provigil [modafinil]; and Adhesive [tape]  Review of Systems Review of Systems  Musculoskeletal: Positive for neck pain.  Skin: Positive for wound.  All other systems reviewed and are negative.    Physical Exam Updated Vital Signs BP (!) 159/90 (BP Location: Right Arm)   Pulse 89   Temp 97.9 F (36.6 C) (Oral)   Resp 18   Ht 5\' 10"  (1.778 m)   Wt 88.9 kg   SpO2 100%   BMI 28.12 kg/m   Physical Exam  Constitutional: She is oriented to person, place, and time. She appears well-developed and well-nourished. No distress.  Elderly female resting comfortably in the bed in no acute distress  HENT:  Head: Normocephalic.  Hematoma of the left parietal without open laceration or injury.  No injury noted elsewhere to the head.  Eyes: Pupils are equal, round, and reactive to light. Conjunctivae and EOM are normal.  EOMI and PERRLA.  No nystagmus.  Neck: Normal range of motion. Neck supple.  Tenderness palpation bilateral neck musculature and trapezius musculature.  No tenderness palpation over midline C-spine.  Moving head easily.  Cardiovascular: Normal rate, regular rhythm and intact distal pulses.  Pulmonary/Chest: Effort normal and breath sounds normal. No respiratory distress. She has no wheezes.  Abdominal: Soft. She exhibits no distension and no mass. There is no tenderness. There is no rebound and no  guarding.  Musculoskeletal: Normal range of motion. She exhibits no tenderness.  Strength intact x4.  Sensation intact x4.  Soft compartments.  Patient is ambulatory without difficulty. No TTP of back or midline spine  Neurological: She is alert and oriented to person, place, and time. No sensory deficit.  Skin: Skin is warm and dry. Capillary refill takes less than 2 seconds.  Psychiatric: She has a normal mood and affect.  Nursing note and vitals reviewed.    ED Treatments / Results  Labs (all labs ordered are listed, but only abnormal results are displayed) Labs Reviewed - No data to display  EKG None  Radiology Ct Head Wo Contrast  Result Date: 11/09/2018 CLINICAL DATA:  Trip and fall with posterior headache, initial encounter EXAM: CT HEAD WITHOUT CONTRAST TECHNIQUE: Contiguous axial images were obtained from the base of the skull through the vertex without intravenous contrast. COMPARISON:  None. FINDINGS: Brain: No evidence of acute infarction, hemorrhage, hydrocephalus, extra-axial collection or mass lesion/mass effect. Vascular: No hyperdense vessel or unexpected calcification. Skull: Normal. Negative for fracture or focal lesion. Sinuses/Orbits: No acute finding. Other: Scalp hematoma in the left posterior parietooccipital region IMPRESSION: Left scalp hematoma.  No acute intracranial abnormality noted. Electronically Signed   By: Inez Catalina M.D.   On: 11/09/2018 12:21    Procedures Procedures (including critical care time)  Medications Ordered in ED Medications - No data to display   Initial Impression / Assessment and Plan / ED Course  I have reviewed the triage vital signs and the nursing notes.  Pertinent labs & imaging results that were available during my care of the patient were reviewed by me and considered in my medical decision making (see chart for details).      Pt presenting for evaluation after mechanical fall.  Physical exam reassuring, no no obvious  neurologic deficits.  Patient with hematoma on her head, but no open laceration.  Ambulatory without difficulty.  She is not on blood thinners.  CT head reviewed by me, no obvious fracture or bleed.  Patient with tenderness of bilateral upper back/trapezius musculature, no pain over midline C-spine.  I do not believe  CT cervical is necessary.  Likely muscle soreness/stiffness from the fall.  She has Robaxin at home that she can take as needed.  Discussed Tylenol as needed for pain.  Patient to follow-up with her PCP for further evaluation.  Strict return precautions given.  Discussed with attending, Dr. Johnney Killian evaluated the patient.  At this time, patient appears safe for discharge.  Patient states she understands and agrees plan.  Final Clinical Impressions(s) / ED Diagnoses   Final diagnoses:  Fall, initial encounter  Hematoma of scalp, initial encounter    ED Discharge Orders    None       Franchot Heidelberg, PA-C 11/09/18 1351    Charlesetta Shanks, MD 11/10/18 [phone]

## 2018-11-09 NOTE — ED Triage Notes (Signed)
Reports fall after tripping approximately 1 hour PTA.  Reports head injury.  Denies LOC.  Denies use of blood thinners.  Alert and oriented, ambulatory to triage in NAD.  Refuses wheelchair.

## 2018-12-03 ENCOUNTER — Ambulatory Visit
Admission: RE | Admit: 2018-12-03 | Discharge: 2018-12-03 | Disposition: A | Discharge status: Home / Self Care | Payer: Medicare Other | Source: Ambulatory Visit | Attending: Internal Medicine | Admitting: Internal Medicine

## 2018-12-03 DIAGNOSIS — Z1231 Encounter for screening mammogram for malignant neoplasm of breast: Secondary | ICD-10-CM

## 2018-12-20 ENCOUNTER — Other Ambulatory Visit: Payer: Self-pay | Admitting: Neurology

## 2019-03-06 ENCOUNTER — Telehealth: Payer: Self-pay | Admitting: *Deleted

## 2019-03-06 ENCOUNTER — Ambulatory Visit: Payer: Medicare Other | Admitting: Neurology

## 2019-03-06 NOTE — Telephone Encounter (Signed)
Called pt to r/s appt we had to cx today since computers were down. She did not have calender with her. She will call back tomorrow. I did advise we had some openings on 03/18/19 we can offer. She will call back between 8-12pm tomorrow to r/s.

## 2019-03-07 ENCOUNTER — Other Ambulatory Visit: Payer: Self-pay | Admitting: Neurology

## 2019-03-07 NOTE — Telephone Encounter (Signed)
Tried calling pt back. Went to VM. Left message for her to call back and r/s appt from yesterday.

## 2019-04-03 ENCOUNTER — Other Ambulatory Visit: Payer: Self-pay | Admitting: Neurology

## 2019-04-03 NOTE — Telephone Encounter (Signed)
Drug registry checked Last fill clonazepam 1mg  03-07-19 # 30.

## 2019-05-16 ENCOUNTER — Other Ambulatory Visit: Payer: Self-pay | Admitting: Neurology

## 2019-05-22 ENCOUNTER — Telehealth: Payer: Self-pay | Admitting: Neurology

## 2019-05-22 NOTE — Telephone Encounter (Signed)
Pt is needing a refill on her clonazePAM (KLONOPIN) 1 MG tablet sent to [facility] on [address]

## 2019-05-22 NOTE — Telephone Encounter (Signed)
Called pt back. She was last seen 09/05/2018. I see she scheduled f/u for 06/25/19 but she would need sooner appt to get medication refilled. Made virtual visit with Dr. Felecia Shelling on 05/26/19 at [place]. Emailed her link at [email or web address]. Verified she received while on phone.  Updated med list, pharmacy, allergies on file. Advised Dr. Felecia Shelling can address refill at time of appt. She verbalized understanding.

## 2019-05-26 ENCOUNTER — Other Ambulatory Visit: Payer: Self-pay

## 2019-05-26 ENCOUNTER — Ambulatory Visit (INDEPENDENT_AMBULATORY_CARE_PROVIDER_SITE_OTHER): Payer: Medicare Other | Admitting: Neurology

## 2019-05-26 ENCOUNTER — Encounter: Payer: Self-pay | Admitting: Neurology

## 2019-05-26 DIAGNOSIS — M069 Rheumatoid arthritis, unspecified: Secondary | ICD-10-CM

## 2019-05-26 DIAGNOSIS — G500 Trigeminal neuralgia: Secondary | ICD-10-CM | POA: Diagnosis not present

## 2019-05-26 DIAGNOSIS — G4752 REM sleep behavior disorder: Secondary | ICD-10-CM

## 2019-05-26 DIAGNOSIS — M545 Low back pain: Secondary | ICD-10-CM

## 2019-05-26 DIAGNOSIS — G8929 Other chronic pain: Secondary | ICD-10-CM

## 2019-05-26 DIAGNOSIS — G609 Hereditary and idiopathic neuropathy, unspecified: Secondary | ICD-10-CM | POA: Diagnosis not present

## 2019-05-26 DIAGNOSIS — R200 Anesthesia of skin: Secondary | ICD-10-CM | POA: Insufficient documentation

## 2019-05-26 MED ORDER — LAMOTRIGINE 150 MG PO TABS: 150.0000 mg | ORAL_TABLET | Freq: Every day | ORAL | 3 refills | Status: DC

## 2019-05-26 MED ORDER — CLONAZEPAM 1 MG PO TABS: 1.0000 mg | ORAL_TABLET | Freq: Every day | ORAL | 5 refills | Status: DC
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
## 2019-06-25 ENCOUNTER — Ambulatory Visit: Payer: Medicare Other | Admitting: Neurology

## 2019-07-21 DIAGNOSIS — N183 Chronic kidney disease, stage 3 unspecified: Secondary | ICD-10-CM | POA: Diagnosis present

## 2019-09-23 ENCOUNTER — Other Ambulatory Visit: Payer: Self-pay | Admitting: Neurology

## 2019-09-29 ENCOUNTER — Telehealth: Payer: Self-pay | Admitting: Neurology

## 2019-09-29 NOTE — Telephone Encounter (Signed)
I called patient and LVM regarding rescheduling 10/27 NCS/EMG due to Elmyra Ricks being out this day. Requested patient call back to reschedule.

## 2019-10-21 ENCOUNTER — Encounter: Payer: Medicare Other | Admitting: Neurology

## 2019-10-27 ENCOUNTER — Other Ambulatory Visit: Payer: Self-pay | Admitting: Internal Medicine

## 2019-10-27 DIAGNOSIS — Z1231 Encounter for screening mammogram for malignant neoplasm of breast: Secondary | ICD-10-CM

## 2019-11-06 ENCOUNTER — Other Ambulatory Visit: Payer: Self-pay | Admitting: Neurology

## 2019-11-18 ENCOUNTER — Encounter: Payer: Medicare Other | Admitting: Neurology

## 2019-11-26 ENCOUNTER — Ambulatory Visit: Payer: Medicare Other | Admitting: Neurology

## 2019-12-02 ENCOUNTER — Other Ambulatory Visit: Payer: Self-pay | Admitting: Neurology

## 2019-12-17 ENCOUNTER — Other Ambulatory Visit: Payer: Self-pay

## 2019-12-17 ENCOUNTER — Ambulatory Visit
Admission: RE | Admit: 2019-12-17 | Discharge: 2019-12-17 | Disposition: A | Discharge status: Home / Self Care | Payer: Medicare Other | Source: Ambulatory Visit | Attending: Internal Medicine | Admitting: Internal Medicine

## 2019-12-17 DIAGNOSIS — Z1231 Encounter for screening mammogram for malignant neoplasm of breast: Secondary | ICD-10-CM

## 2019-12-23 ENCOUNTER — Other Ambulatory Visit: Payer: Self-pay | Admitting: Neurology

## 2019-12-30 ENCOUNTER — Ambulatory Visit: Payer: Medicare Other | Admitting: Neurology

## 2020-01-05 ENCOUNTER — Encounter: Payer: Self-pay | Admitting: Neurology

## 2020-01-06 ENCOUNTER — Ambulatory Visit (INDEPENDENT_AMBULATORY_CARE_PROVIDER_SITE_OTHER): Payer: Medicare PPO | Admitting: Neurology

## 2020-01-06 ENCOUNTER — Encounter: Payer: Self-pay | Admitting: Neurology

## 2020-01-06 ENCOUNTER — Other Ambulatory Visit: Payer: Self-pay

## 2020-01-06 ENCOUNTER — Ambulatory Visit: Payer: Medicare PPO | Admitting: Neurology

## 2020-01-06 DIAGNOSIS — R200 Anesthesia of skin: Secondary | ICD-10-CM

## 2020-01-06 DIAGNOSIS — M069 Rheumatoid arthritis, unspecified: Secondary | ICD-10-CM

## 2020-01-06 DIAGNOSIS — M5416 Radiculopathy, lumbar region: Secondary | ICD-10-CM | POA: Insufficient documentation

## 2020-01-06 DIAGNOSIS — M5412 Radiculopathy, cervical region: Secondary | ICD-10-CM | POA: Diagnosis not present

## 2020-01-06 DIAGNOSIS — G8929 Other chronic pain: Secondary | ICD-10-CM

## 2020-01-06 DIAGNOSIS — G609 Hereditary and idiopathic neuropathy, unspecified: Secondary | ICD-10-CM | POA: Diagnosis not present

## 2020-01-06 DIAGNOSIS — M545 Low back pain: Secondary | ICD-10-CM | POA: Diagnosis not present
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
## 2020-01-06 NOTE — Progress Notes (Signed)
       Full Name: [name] Gender: Female MRN #: [id number] Date of Birth: 01/21/1946    Visit Date: 01/06/2020 07:24 Age: 74 Years Examining Physician: Arlice Colt, MD  Referring Physician: Arlice Colt, MD     History: Kristen Hansen is a 74 year old woman with rheumatoid arthritis reporting numbness in the hands and feet.  The numbness in the hands is more pronounced in the fourth and fifth fingers and adjacent palm compared to the thumb and palm.  On exam, she had reduced sensation to touch in that distribution.  In the feet, she had about a 25% reduction in vibration sensation at the toes relative to the knee.  Reflexes were present.  Nerve conduction studies: Bilateral median motor responses were slowed across the wrist and the left response had a reduced amplitude.  Conduction velocity in the forearm was normal.  Bilateral ulnar motor responses had borderline prolonged distal latencies with normal amplitudes and conduction velocity in the forearm and around the elbow.  The left peroneal motor response had normal distal latency, amplitude and conduction velocity.  The left tibial motor response had a normal distal latency but a moderately reduced amplitude and minimally reduced velocity.  Bilateral ulnar F-wave latencies were top normal.  The left tibial F-wave latency was mildly slowed.  Bilateral median sensory responses had mildly slowed peak latencies across the wrist with normal amplitudes.  Bilateral ulnar sensory responses had mildly slowed peak latencies with normal amplitudes.  Bilateral radial sensory responses had normal peak latencies and amplitudes.  Bilateral sural sensory responses were absent.  Electromyography: Needle EMG was performed with selected muscles from the left arm and leg.  In the left arm, there was mild chronic denervation involving the triceps and the extensor digitorum communis muscle.  In the left leg, there was moderate chronic denervation involving the  abductor hallucis muscle and mild chronic denervation involving the iliopsoas, gluteus medius, gastrocnemius, tibialis anterior and vastus medialis muscles.  Impression: This NCV/EMG study shows the following: 1.   Mild length dependent sensorimotor polyneuropathy.   2.   Multiple mild chronic radiculopathies in the left leg involving at least the L3, L4 and S1 nerve roots. 3.   Mild chronic left C7 radiculopathy 4.   Moderate median neuropathy at the left wrist (moderate carpal tunnel syndrome)    A. Felecia Shelling, MD, PhD, FAAN Certified in Neurology, Clinical Neurophysiology, Sleep Medicine, Pain Medicine and Neuroimaging Director, [place] [facility] [address], [place], [place] [address] [phone]

## 2020-01-12 LAB — MULTIPLE MYELOMA PANEL, SERUM
Albumin SerPl Elph-Mcnc: 4.2 g/dL (ref 2.9–4.4)
Albumin/Glob SerPl: 1.7 (ref 0.7–1.7)
Alpha 1: 0.2 g/dL (ref 0.0–0.4)
Alpha2 Glob SerPl Elph-Mcnc: 0.6 g/dL (ref 0.4–1.0)
B-Globulin SerPl Elph-Mcnc: 1 g/dL (ref 0.7–1.3)
Gamma Glob SerPl Elph-Mcnc: 0.7 g/dL (ref 0.4–1.8)
Globulin, Total: 2.6 g/dL (ref 2.2–3.9)
IgA/Immunoglobulin A, Serum: 209 mg/dL (ref 64–422)
IgG (Immunoglobin G), Serum: 780 mg/dL (ref 586–1602)
IgM (Immunoglobulin M), Srm: 106 mg/dL (ref 26–217)
Total Protein: 6.8 g/dL (ref 6.0–8.5)

## 2020-01-12 LAB — RHEUMATOID FACTOR: Rhuematoid fact SerPl-aCnc: 12.5 IU/mL (ref 0.0–13.9)

## 2020-01-13 ENCOUNTER — Other Ambulatory Visit: Payer: Self-pay | Admitting: Neurology

## 2020-01-13 ENCOUNTER — Telehealth: Payer: Self-pay | Admitting: *Deleted

## 2020-01-13 NOTE — Telephone Encounter (Signed)
Called and spoke with pt about results per Dr. Felecia Shelling note. She verbalized understanding.   She will call [facility] to scheduled her MRI's at [phone].

## 2020-01-13 NOTE — Telephone Encounter (Signed)
-----   Message from Britt Bottom, MD sent at 01/12/2020  6:43 PM EST ----- Please let her know that the lab work was fine.  We will let her know the results of the MRI after they are performed.

## 2020-02-06 ENCOUNTER — Ambulatory Visit
Admission: RE | Admit: 2020-02-06 | Discharge: 2020-02-06 | Disposition: A | Discharge status: Home / Self Care | Payer: Medicare Other | Source: Ambulatory Visit | Attending: Neurology | Admitting: Neurology

## 2020-02-06 ENCOUNTER — Other Ambulatory Visit: Payer: Self-pay

## 2020-02-06 DIAGNOSIS — R200 Anesthesia of skin: Secondary | ICD-10-CM

## 2020-02-06 DIAGNOSIS — M069 Rheumatoid arthritis, unspecified: Secondary | ICD-10-CM

## 2020-02-06 DIAGNOSIS — M545 Low back pain, unspecified: Secondary | ICD-10-CM

## 2020-02-06 DIAGNOSIS — G8929 Other chronic pain: Secondary | ICD-10-CM

## 2020-02-09 ENCOUNTER — Telehealth: Payer: Self-pay | Admitting: Neurology

## 2020-02-09 NOTE — Telephone Encounter (Signed)
I spoke to Kristen Hansen about the results of the MRI of the cervical and lumbar spine.  She has multilevel degenerative changes as detailed in the review reports.  [facility] neurosurgery is having her do physical therapy.  Epidural steroid injections have not helped her previously.  If she does not get a significant benefit from physical therapy, she would need to discuss with them whether or not she is a surgical candidate.  Since she had 4 ESI's without benefit I do not think it makes sense to try additional ones at this time.

## 2020-06-15 ENCOUNTER — Other Ambulatory Visit: Payer: Self-pay | Admitting: Neurology

## 2020-08-07 ENCOUNTER — Other Ambulatory Visit: Payer: Self-pay | Admitting: Neurology

## 2020-08-14 ENCOUNTER — Other Ambulatory Visit: Payer: Self-pay | Admitting: Neurology

## 2020-08-27 ENCOUNTER — Other Ambulatory Visit: Payer: Self-pay | Admitting: Neurology

## 2020-09-22 ENCOUNTER — Other Ambulatory Visit: Payer: Self-pay | Admitting: Neurology

## 2020-09-22 NOTE — Telephone Encounter (Signed)
Does patient need both rx? -VRP

## 2020-09-23 NOTE — Telephone Encounter (Signed)
FYI. 1 was approved, but the other was not by Danae Chen. Please review. -VRP

## 2020-09-27 ENCOUNTER — Ambulatory Visit (INDEPENDENT_AMBULATORY_CARE_PROVIDER_SITE_OTHER): Payer: Medicare PPO | Admitting: Neurology

## 2020-09-27 ENCOUNTER — Encounter: Payer: Self-pay | Admitting: Neurology

## 2020-09-27 VITALS — BP 142/82 | HR 94 | Ht 70.0 in | Wt 170.5 lb

## 2020-09-27 DIAGNOSIS — R404 Transient alteration of awareness: Secondary | ICD-10-CM

## 2020-09-27 DIAGNOSIS — G500 Trigeminal neuralgia: Secondary | ICD-10-CM

## 2020-09-27 DIAGNOSIS — Z981 Arthrodesis status: Secondary | ICD-10-CM

## 2020-09-27 DIAGNOSIS — M069 Rheumatoid arthritis, unspecified: Secondary | ICD-10-CM

## 2020-09-27 DIAGNOSIS — I951 Orthostatic hypotension: Secondary | ICD-10-CM

## 2020-09-27 MED ORDER — PYRIDOSTIGMINE BROMIDE 60 MG PO TABS: ORAL_TABLET | ORAL | 5 refills | Status: DC

## 2020-09-27 NOTE — Progress Notes (Signed)
[facility]  PATIENT: Kristen Hansen DOB: 1946/04/27  REFERRING DOCTOR OR PCP: Cathi Roan, PA-C SOURCE: Patient, notes from primary care, previous neurology notes, imaging and laboratory report,  _________________________________   HISTORICAL  CHIEF COMPLAINT:  Chief Complaint  Patient presents with  . New Patient (Initial Visit)    RM 12. Paper referral from Mellody Life, PA (PCP) for transient alteration in awareness/concern for seizures.  . Gait Problem    Ambulates with cane  . Seizures    Had cervical surgery 07/2020. Over a stretch of three years, she has had episodes of altered awareness. One episode occurred at PT recently, she became unresponsivess to ques (lasted 1-2 min). 05/2020-she was with a gathering of women, sitting on picnic table, face turned white and she became unresponsive to questions, did not black out. She has been trying to drink more water. Couple years ago, at craft fair and walking. She ended up on the floor unaware how she got there.     HISTORY OF PRESENT ILLNESS:  Kristen Hansen is a 74 year old woman who I have seen in the past for trigeminal neuralgia and spine degenerative issues now being referred for episodes of transient alteration of awareness.  She is a 74 year old woman who has had 3 episodes of reduced awareness over the past few years.  A couple years ago while at a craft fair she was walking and the next thing she remembers she was unaware on the floor. It was witnessed and a friend said she just dropped (not tripped) and she recalls people around her when she came to.   No tongue biting or incontinence.   She quickly was back to baseline.  Most recently, June 2021, she was with a group of women at a picnic table when her face turned white and she became unresponsive to questions.  She had just stood up at the time it started and others helped her back down.    There was no complete loss of consciousness and there was no  generalized tonic-clonic activity.  She very quickly was talking normally.  Had cervical spine surgery 08/13/2020.  While in physical therapy, standing working on balance, she had an episode where she was unresponsive to questions that lasted 1 to 2 minutes.  There was no generalized tonic-clonic activity.   She has no memory of the actual events but clearly remembers before and after the events.    She has had other episodes of feeling lightheaded upon standing.   These are mild and not associated with alteration of awareness.    She had both spinal stenosis in her cervical spine and lumbar spine.   She had cervical spine surgery (C3-C7 ACDF) by Dr Minette Brine [facility]) 07/2020.   She may also need lumbar surgery.    She has trigeminal neuralgia, always on her left.   She had some other sensory symptoms on the right a couple times.  She is on lamotrigine 150 mg po bid and oxcarbazapine300 mg po bid.  Her feet are turning purplish at times.   They are sometimes cold   REVIEW OF SYSTEMS: Constitutional: No fevers, chills, sweats, or change in appetite Eyes: No visual changes, double vision, eye pain Ear, nose and throat: No hearing loss, ear pain, nasal congestion, sore throat Cardiovascular: No chest pain, palpitations Respiratory: No shortness of breath at rest or with exertion.   No wheezes GastrointestinaI: No nausea, vomiting, diarrhea, abdominal pain, fecal incontinence Genitourinary: No dysuria, urinary retention or frequency.  No nocturia. Musculoskeletal:As above Integumentary: No rash, pruritus, skin lesions Neurological: as above Psychiatric: No depression at this time.  No anxiety Endocrine: No palpitations, diaphoresis, change in appetite, change in weigh or increased thirst Hematologic/Lymphatic: No anemia, purpura, petechiae. Allergic/Immunologic: No itchy/runny eyes, nasal congestion, recent allergic reactions, rashes  ALLERGIES: Allergies  Allergen Reactions  . Morphine And  Related Hives  . Prevacid [Lansoprazole] Other (See Comments)    unknown  . Provigil [Modafinil] Other (See Comments)    dizziness  . Adhesive [Tape] Rash    bandaids     HOME MEDICATIONS:  Current Outpatient Medications:  .  amLODipine-olmesartan (AZOR) 5-40 MG per tablet, Take 1 tablet by mouth every evening. , Disp: , Rfl:  .  clonazePAM (KLONOPIN) 1 MG tablet, TAKE 1 TABLET BY MOUTH ONCE DAILY AT BEDTIME . APPOINTMENT REQUIRED FOR FUTURE REFILLS, Disp: 30 tablet, Rfl: 0 .  Coenzyme Q10 (CO Q 10 PO), Take 1 Dose by mouth daily., Disp: , Rfl:  .  Docusate Calcium (STOOL SOFTENER PO), Take 1 tablet by mouth daily., Disp: , Rfl:  .  etodolac (LODINE) 400 MG tablet, Take 400 mg by mouth 2 (two) times daily., Disp: , Rfl: 5 .  imipramine (TOFRANIL) 25 MG tablet, TAKE 1 TABLET BY MOUTH AT BEDTIME, Disp: 90 tablet, Rfl: 3 .  lamoTRIgine (LAMICTAL) 150 MG tablet, Take 1 tablet (150 mg total) by mouth daily. appointment needed for further refills., Disp: 60 tablet, Rfl: 0 .  loratadine (CLARITIN) 10 MG tablet, Take 10 mg by mouth daily., Disp: , Rfl:  .  mometasone (NASONEX) 50 MCG/ACT nasal spray, Place 1 spray into the nose daily as needed (CONGESTION). , Disp: , Rfl:  .  montelukast (SINGULAIR) 10 MG tablet, Take 10 mg by mouth at bedtime., Disp: , Rfl:  .  omeprazole (PRILOSEC) 20 MG capsule, Take 20 mg by mouth daily. , Disp: , Rfl:  .  Oxcarbazepine (TRILEPTAL) 300 MG tablet, TAKE ONE TABLET BY MOUTH IN THE MORNING, 1 AT NOON AND 2 AT BEDTIME, Disp: 360 tablet, Rfl: 3 .  pravastatin (PRAVACHOL) 40 MG tablet, Take 40 mg by mouth daily before breakfast. , Disp: , Rfl:  .  valACYclovir (VALTREX) 500 MG tablet, TAKE 2 TABLETS BY MOUTH EVERY 12 HOURS AS NEEDED FOR FEVER BLISTERS, Disp: , Rfl:   PAST MEDICAL HISTORY: Past Medical History:  Diagnosis Date  . Back pain   . Cancer (HCC)    Skin, basal cell  . Dyspnea   . Fatigue   . GERD (gastroesophageal reflux disease)   .  Headache(784.0)    history of migraines  . History of kidney stones   . Hypercholesteremia   . Hypertension   . Leg swelling    Bilateral  . Occasional tremors    mild hand tremors  . Osteoarthritis   . PONV (postoperative nausea and vomiting)   . RBD (REM behavioral disorder)   . Rheumatoid arthritis(714.0)   . Scoliosis of lumbar spine   . Spinal stenosis    Multiple levels  . 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 Trigeminal neuralgia 03/30/2015  . Vision abnormalities     PAST SURGICAL HISTORY: Past Surgical History:  Procedure Laterality Date  . ABDOMINAL HYSTERECTOMY    . AUGMENTATION MAMMAPLASTY Bilateral   . COLONOSCOPY    . JOINT REPLACEMENT     right uni knee, right hip with 2 revisions  . KIDNEY STONE SURGERY Bilateral   . KNEE ARTHROTOMY Right 02/27/2018   Procedure: RIGHT  KNEE ARTHROTOMY; scar excision;  Surgeon: Gaynelle Arabian, MD;  Location: [facility];  Service: Orthopedics;  Laterality: Right;  . MOHS SURGERY    . TONSILLECTOMY    . TOTAL HIP ARTHROPLASTY    . TOTAL KNEE ARTHROPLASTY  01/22/2013   Procedure: TOTAL KNEE ARTHROPLASTY;  Surgeon: Gearlean Alf, MD;  Location: [facility];  Service: Orthopedics;  Laterality: Right;  Revision of a Right Uni Knee to a Total Knee Arthroplasty    FAMILY HISTORY: Family History  Problem Relation Age of Onset  . Heart disease Mother   . Parkinson's disease Father     SOCIAL HISTORY:  Social History   Socioeconomic History  . Marital status: Married    Spouse name: Not on file  . Number of children: Not on file  . Years of education: Not on file  . Highest education level: Not on file  Occupational History  . Not on file  Tobacco Use  . Smoking status: Never Smoker  . Smokeless tobacco: Never Used  Vaping Use  . Vaping Use: Never used  Substance and Sexual Activity  . Alcohol use: Yes    Alcohol/week: 0.0 standard drinks    Comment: occasional  . Drug use: No  . Sexual activity: Not on file  Other Topics Concern  . Not on  file  Social History Narrative   Lives   Caffeine use:    Social Determinants of Health   Financial Resource Strain:   . Difficulty of Paying Living Expenses: Not on file  Food Insecurity:   . Worried About Charity fundraiser in the Last Year: Not on file  . Ran Out of Food in the Last Year: Not on file  Transportation Needs:   . Lack of Transportation (Medical): Not on file  . Lack of Transportation (Non-Medical): Not on file  Physical Activity:   . Days of Exercise per Week: Not on file  . Minutes of Exercise per Session: Not on file  Stress:   . Feeling of Stress : Not on file  Social Connections:   . Frequency of Communication with Friends and Family: Not on file  . Frequency of Social Gatherings with Friends and Family: Not on file  . Attends Religious Services: Not on file  . Active Member of Clubs or Organizations: Not on file  . Attends Archivist Meetings: Not on file  . Marital Status: Not on file  Intimate Partner Violence:   . Fear of Current or Ex-Partner: Not on file  . Emotionally Abused: Not on file  . Physically Abused: Not on file  . Sexually Abused: Not on file     PHYSICAL EXAM  Vitals:   09/27/20 0940  BP: (!) 142/82  Pulse: 94  SpO2: 95%  Weight: 170 lb 8 oz (77.3 kg)  Height: 5\' 10"  (1.778 m)    Body mass index is 24.46 kg/m.  Sitting:       151/88  90 Standing (30 sec)  113/70  95 Standing (120 sec)    108/72  96  General: The patient is well-developed and well-nourished and in no acute distress.  She has a cervical collar  HEENT:  Head is [place]/AT.  Sclera are anicteric.    Neck: No carotid bruits are noted.  The neck is post-op in a cervical collar.    Cardiovascular: The heart has a regular rate and rhythm with a normal S1 and S2. There were no murmurs, gallops or rubs.  BP as  above  Skin: Extremities are without rash or edema.   Mild skin color changes   Musculoskeletal:  Back is nontender  Neurologic Exam  Mental  status: The patient is alert and oriented x 3 at the time of the examination. The patient has apparent normal recent and remote memory, with an apparently normal attention span and concentration ability.   Speech is normal.  Cranial nerves: Extraocular movements are full.    Facial symmetry is present. There is good facial sensation to soft touch bilaterally.Facial strength is normal.  Trapezius and sternocleidomastoid strength is normal. No dysarthria is noted.    No obvious hearing deficits are noted.  Motor:  Muscle bulk is normal.   Tone is normal. Strength is  5 / 5 in all 4 extremities.   Sensory: Sensory testing is intact to pinprick, soft touch and vibration sensation in arms but reduced vibration and touch in feet (80% vib at ankles and 40% at toes)  Coordination: Cerebellar testing reveals good finger-nose-finger and heel-to-shin bilaterally.  Gait and station: Station is normal.   Gait is mildly wide and she turns in 4 steps. . Romberg is negative.   Reflexes: Deep tendon reflexes are symmetric and normal bilaterally.   Plantar responses are flexor.    DIAGNOSTIC DATA (LABS, IMAGING, TESTING) - I reviewed patient records, labs, notes, testing and imaging myself where available.  Lab Results  Component Value Date   WBC 5.4 02/20/2018   HGB 12.4 02/20/2018   HCT 37.3 02/20/2018   MCV 88.6 02/20/2018   PLT 205 02/20/2018      Component Value Date/Time   NA 137 02/20/2018 1403   K 4.3 02/20/2018 1403   CL 106 02/20/2018 1403   CO2 23 02/20/2018 1403   GLUCOSE 98 02/20/2018 1403   BUN 25 (H) 02/20/2018 1403   CREATININE 1.05 (H) 02/20/2018 1403   CALCIUM 9.1 02/20/2018 1403   PROT 6.8 01/06/2020 0854   ALBUMIN 3.9 02/20/2018 1403   AST 22 02/20/2018 1403   ALT 22 02/20/2018 1403   ALKPHOS 111 02/20/2018 1403   BILITOT 0.5 02/20/2018 1403   GFRNONAA 52 (L) 02/20/2018 1403   GFRAA >60 02/20/2018 1403       ASSESSMENT AND PLAN  Transient alteration of awareness  - Plan: EEG adult  Rheumatoid arthritis, involving unspecified site, unspecified whether rheumatoid factor present (HCC)  Trigeminal neuralgia  History of fusion of cervical spine  Orthostatic hypotension  1.   She has had 3 episodes of transient alteration of awareness.  All of the episodes have occurred while she was standing and they have occurred despite being on at the epilepsy medication for her trigeminal neuralgia.  On examination, she had orthostatic reduction in blood pressure though not to the hypertension range.  I feel it is more likely that the episodes were due to orthostatic hypotension then to seizure activity.  However, we do need to check an EEG to determine if there is any epileptiform activity.  I will also have her start Mestinon for the orthostatic hypotension. 2.   Stay active and exercise as tolerated. 3.   We will let her know the results of the EEG as she will return for her regular visit in 3 months.  She should call sooner if she has new or worsening neurologic symptoms.  45-minute office visit with the majority of the time spent face-to-face for history and physical, discussion/counseling and decision-making.  Additional time with record review and documentation.      August Saucer, MD, [facility] 29/01/4461, 86:38 AM Certified in Neurology, Clinical Neurophysiology, Sleep Medicine and Neuroimaging  [facility] [address] [place], [place] [address] [phone]

## 2020-10-06 ENCOUNTER — Other Ambulatory Visit: Payer: Medicare PPO

## 2020-10-13 ENCOUNTER — Ambulatory Visit (INDEPENDENT_AMBULATORY_CARE_PROVIDER_SITE_OTHER): Payer: Medicare PPO | Admitting: Neurology

## 2020-10-13 DIAGNOSIS — R4182 Altered mental status, unspecified: Secondary | ICD-10-CM

## 2020-10-13 DIAGNOSIS — R404 Transient alteration of awareness: Secondary | ICD-10-CM

## 2020-10-14 NOTE — Progress Notes (Addendum)
   [facility]  EEG (ELECTROENCEPHALOGRAM) REPORT   STUDY DATE: 10/13/2020 PATIENT NAME: Kristen Hansen DOB: 1946/04/20 MRN: [id number]  ORDERING CLINICIAN: 10/13/2020  TECHNOLOGIST: Nonda Lou, RPSGT TECHNIQUE: Electroencephalogram was recorded utilizing standard 10-20 system of lead placement and reformatted into average and bipolar montages.  RECORDING TIME: 24 minutes 58 seconds  CLINICAL INFORMATION: 74 year old woman with transient alteration of awareness  FINDINGS: A digital EEG was performed while the patient was awake and drowsy. While awake and most alert there was a 7-8 hz posterior dominant rhythm. Voltages and frequencies were symmetric.  There were no focal, lateralizing, epileptiform activity or seizures seen.  Photic stimulation showed a driving response at middle frequencies.  Hyperventilation and recovery did not change the underlying rhythms.  The patient became drowsy but did not fall asleep.  IMPRESSION: This EEG while the patient was awake and drowsy showed mild generalized cortical slowing that could be due to mild underlying cortical dysfunction.  There were no seizures or other epileptiform activity   INTERPRETING PHYSICIAN:    A. Felecia Shelling, MD, PhD, [facility] Certified in Neurology, Clinical Neurophysiology, Sleep Medicine, Pain Medicine and Neuroimaging  [facility] [address] [place], [place] [address] [phone]

## 2020-10-15 ENCOUNTER — Other Ambulatory Visit: Payer: Self-pay | Admitting: Neurology

## 2020-10-18 ENCOUNTER — Telehealth: Payer: Self-pay | Admitting: *Deleted

## 2020-10-18 NOTE — Telephone Encounter (Signed)
-----   Message from Britt Bottom, MD sent at 10/14/2020  5:53 PM EDT ----- Please let her know that the EEG looked okay.  She did not have any seizures or abnormal electrical discharges.  The brain activity was slightly slow which sometimes occurs with aging.

## 2020-10-18 NOTE — Telephone Encounter (Signed)
Called and spoke with pt about results per Dr Felecia Shelling note. She verbalized understanding.

## 2020-10-26 ENCOUNTER — Other Ambulatory Visit: Payer: Self-pay | Admitting: Neurology

## 2020-11-08 ENCOUNTER — Other Ambulatory Visit: Payer: Self-pay | Admitting: Internal Medicine

## 2020-11-08 DIAGNOSIS — Z1231 Encounter for screening mammogram for malignant neoplasm of breast: Secondary | ICD-10-CM

## 2020-11-08 DIAGNOSIS — I5032 Chronic diastolic (congestive) heart failure: Secondary | ICD-10-CM | POA: Diagnosis present

## 2020-12-21 ENCOUNTER — Other Ambulatory Visit: Payer: Self-pay

## 2020-12-21 ENCOUNTER — Ambulatory Visit
Admission: RE | Admit: 2020-12-21 | Discharge: 2020-12-21 | Disposition: A | Discharge status: Home / Self Care | Payer: Medicare PPO | Source: Ambulatory Visit | Attending: Internal Medicine | Admitting: Internal Medicine

## 2020-12-21 DIAGNOSIS — Z1231 Encounter for screening mammogram for malignant neoplasm of breast: Secondary | ICD-10-CM

## 2021-01-13 ENCOUNTER — Other Ambulatory Visit: Payer: Self-pay | Admitting: Neurology

## 2021-01-20 ENCOUNTER — Other Ambulatory Visit: Payer: Self-pay

## 2021-01-20 ENCOUNTER — Ambulatory Visit: Payer: Medicare PPO | Admitting: Neurology

## 2021-01-20 ENCOUNTER — Encounter: Payer: Self-pay | Admitting: Neurology

## 2021-01-20 ENCOUNTER — Telehealth: Payer: Self-pay | Admitting: Neurology

## 2021-01-20 VITALS — BP 174/88 | HR 91 | Ht 70.0 in | Wt 167.0 lb

## 2021-01-20 DIAGNOSIS — G500 Trigeminal neuralgia: Secondary | ICD-10-CM | POA: Diagnosis not present

## 2021-01-20 DIAGNOSIS — H81399 Other peripheral vertigo, unspecified ear: Secondary | ICD-10-CM | POA: Insufficient documentation

## 2021-01-20 DIAGNOSIS — R404 Transient alteration of awareness: Secondary | ICD-10-CM

## 2021-01-20 DIAGNOSIS — M069 Rheumatoid arthritis, unspecified: Secondary | ICD-10-CM | POA: Diagnosis not present

## 2021-01-20 DIAGNOSIS — Z981 Arthrodesis status: Secondary | ICD-10-CM

## 2021-01-20 NOTE — Telephone Encounter (Signed)
Mcarthur Rossetti Josem Kaufmann: [id number] (exp. 01/20/21 to 02/19/21) order sent to GI. They will reach out to the patient to schedule.

## 2021-01-20 NOTE — Progress Notes (Signed)
[facility]  PATIENT: Kristen Hansen DOB: 1946-02-16  REFERRING DOCTOR OR PCP: Cathi Roan, PA-C SOURCE: Patient, notes from primary care, previous neurology notes, imaging and laboratory report,  _________________________________   HISTORICAL  CHIEF COMPLAINT:  Chief Complaint  Patient presents with  . Follow-up    RM 13 with husband. Last seen 09/27/2020.     HISTORY OF PRESENT ILLNESS:  Kristen Hansen is a 75 year old woman with several ep-isode of transient alteration of awareness, last one in 2021  Update 01/21/2020: Since the last visit, she has had no episodes of LOC but has spells of dizziness feeling like she is being pushed to the left.  She feels lightheaded upon standing.   She was orthostatic last visit and mestinon was started with benefit.    She has trigeminal neuralgia, always on her left.   She is actually doing much better,  She had some other sensory symptoms on the right a couple times.  She is on lamotrigine 150 mg po qd and oxcarbazapine300 mg po tid.  Speech has been less fluent -- halting quality since around the tie of her cervical fusion 07/2020.  She sleeps more than she used to and also has delayed stage sleep (goes to bed around 1 am).   She no longer has active dreams since the benzodiazepine was added.    She  notes more trouble with cogntion and is more apathetic.  Denies depression.  This EEG 10/13/2020:  Normal EEG while the patient was awake and drowsy showed mild generalized cortical slowing that could be due to mild underlying cortical dysfunction.  There were no seizures or other epileptiform activity  She has a recent DVT 11/09/2020.  She remains on Eliquis.  Her feet are turning purplish at times.   They are sometimes cold.  She notes edema later in the day, left > right.    She had both spinal stenosis in her cervical spine and lumbar spine.   She had cervical spine surgery (C3-C7 ACDF) by Dr Minette Brine [facility]) 07/2020.   She  may also need lumbar surgery.    Transient Alteration of Awareness History: She  has had 3 episodes of reduced awareness between 2018 and 2021.  In 2018, while at a craft fair she was walking and the next thing she remembers she was unaware on the floor. It was witnessed and a friend said she just dropped (not tripped) and she recalls people around her when she came to.   No tongue biting or incontinence.   She quickly was back to baseline.  Most recently, June 2021, she was with a group of women at a picnic table when her face turned white and she became unresponsive to questions.  She had just stood up at the time it started and others helped her back down.    There was no complete loss of consciousness and there was no generalized tonic-clonic activity.  She very quickly was talking normally.  Also, she had cervical spine surgery 08/13/2020.  While in physical therapy, standing working on balance, she had an episode where she was unresponsive to questions that lasted 1 to 2 minutes.  There was no generalized tonic-clonic activity.   She has no memory of the actual events but clearly remembers before and after the events.    _______________________________________________  IMAGING: [facility] 02/06/2020 IMPRESSION: This MRI of the lumbar spine without contrast shows the following: 1.    Mild levoscoliosis. 2.    At L2-L3,  there is mild spinal stenosis due to degenerative changes detailed above.  There did not appear to be any nerve root compression. 3.    At L3-L4, there is moderate spinal stenosis due to degenerative changes detailed above but there does not appear to be any nerve root compression. 4.    At L4-L5, there is anterolisthesis and other degenerative changes detailed above causing moderate spinal stenosis and moderately severe left lateral recess stenosis with possible left L5 nerve root compression. 5.    At L5-S1, there are degenerative changes as detailed above causing moderate spinal  stenosis but there did not appear to be nerve root compression. 6.    There are milder degenerative changes at T11-T12 and L1-L2 that do not lead to spinal stenosis or nerve root compression.  Cervical Spine 02/06/2020 IMPRESSION: This MRI of the cervical spine without contrast shows the following: 1.   The spinal cord appears normal. 2.   At C3-C4, there is moderate spinal stenosis and mild to moderate bilateral foraminal narrowing but no nerve root compression. 3.   At C4-C5, there is moderate spinal stenosis, right greater than left, mostly due to a right paramedian disc extrusion.  There is moderately severe right foraminal narrowing with possible right C5 nerve root compression. 4.   At C5-C6, there are degenerative changes causing moderate spinal stenosis and moderately severe right foraminal narrowing with possible right C6 nerve root compression. 5.   C6-C7, there are degenerative changes causing moderate spinal stenosis and moderately severe left foraminal narrowing which could lead to left C7 nerve root compression.   REVIEW OF SYSTEMS: Constitutional: No fevers, chills, sweats, or change in appetite Eyes: No visual changes, double vision, eye pain Ear, nose and throat: No hearing loss, ear pain, nasal congestion, sore throat Cardiovascular: No chest pain, palpitations Respiratory: No shortness of breath at rest or with exertion.   No wheezes GastrointestinaI: No nausea, vomiting, diarrhea, abdominal pain, fecal incontinence Genitourinary: No dysuria, urinary retention or frequency.  No nocturia. Musculoskeletal:As above Integumentary: No rash, pruritus, skin lesions Neurological: as above Psychiatric: No depression at this time.  No anxiety Endocrine: No palpitations, diaphoresis, change in appetite, change in weigh or increased thirst Hematologic/Lymphatic: No anemia, purpura, petechiae. Allergic/Immunologic: No itchy/runny eyes, nasal congestion, recent allergic reactions,  rashes  ALLERGIES: Allergies  Allergen Reactions  . Morphine And Related Hives  . Prevacid [Lansoprazole] Other (See Comments)    unknown  . Provigil [Modafinil] Other (See Comments)    dizziness  . Adhesive [Tape] Rash    bandaids     HOME MEDICATIONS:  Current Outpatient Medications:  .  amLODipine-olmesartan (AZOR) 5-40 MG tablet, Take 1 tablet by mouth every evening. , Disp: , Rfl:  .  clonazePAM (KLONOPIN) 1 MG tablet, TAKE 1 TABLET BY MOUTH AT BEDTIME, Disp: 30 tablet, Rfl: 5 .  Coenzyme Q10 (CO Q 10 PO), Take 1 Dose by mouth daily., Disp: , Rfl:  .  Docusate Calcium (STOOL SOFTENER PO), Take 1 tablet by mouth daily., Disp: , Rfl:  .  etodolac (LODINE) 400 MG tablet, Take 400 mg by mouth 2 (two) times daily., Disp: , Rfl: 5 .  imipramine (TOFRANIL) 25 MG tablet, TAKE 1 TABLET BY MOUTH AT BEDTIME, Disp: 90 tablet, Rfl: 3 .  lamoTRIgine (LAMICTAL) 150 MG tablet, Take 1 tablet (150 mg total) by mouth daily. appointment needed for further refills., Disp: 60 tablet, Rfl: 0 .  loratadine (CLARITIN) 10 MG tablet, Take 10 mg by mouth daily., Disp: ,  Rfl:  .  mometasone (NASONEX) 50 MCG/ACT nasal spray, Place 1 spray into the nose daily as needed (CONGESTION). , Disp: , Rfl:  .  montelukast (SINGULAIR) 10 MG tablet, Take 10 mg by mouth at bedtime., Disp: , Rfl:  .  omeprazole (PRILOSEC) 20 MG capsule, Take 20 mg by mouth daily. , Disp: , Rfl:  .  Oxcarbazepine (TRILEPTAL) 300 MG tablet, TAKE 1 TABLET BY MOUTH IN THE MORNING AND 1 AT NOON AND 2 AT BEDTIME, Disp: 360 tablet, Rfl: 3 .  pravastatin (PRAVACHOL) 40 MG tablet, Take 40 mg by mouth daily before breakfast. , Disp: , Rfl:  .  pyridostigmine (MESTINON) 60 MG tablet, Take 2 to 3 a day, Disp: 90 tablet, Rfl: 5 .  valACYclovir (VALTREX) 500 MG tablet, TAKE 2 TABLETS BY MOUTH EVERY 12 HOURS AS NEEDED FOR FEVER BLISTERS, Disp: , Rfl:   PAST MEDICAL HISTORY: Past Medical History:  Diagnosis Date  . Back pain   . Cancer (HCC)     Skin, basal cell  . Dyspnea   . Fatigue   . GERD (gastroesophageal reflux disease)   . Headache(784.0)    history of migraines  . History of kidney stones   . Hypercholesteremia   . Hypertension   . Leg swelling    Bilateral  . Occasional tremors    mild hand tremors  . Osteoarthritis   . PONV (postoperative nausea and vomiting)   . RBD (REM behavioral disorder)   . Rheumatoid arthritis(714.0)   . Scoliosis of lumbar spine   . Spinal stenosis    Multiple levels  . 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 Trigeminal neuralgia 03/30/2015  . Vision abnormalities     PAST SURGICAL HISTORY: Past Surgical History:  Procedure Laterality Date  . ABDOMINAL HYSTERECTOMY    . AUGMENTATION MAMMAPLASTY Bilateral   . COLONOSCOPY    . JOINT REPLACEMENT     right uni knee, right hip with 2 revisions  . KIDNEY STONE SURGERY Bilateral   . KNEE ARTHROTOMY Right 02/27/2018   Procedure: RIGHT KNEE ARTHROTOMY; scar excision;  Surgeon: Ollen Gross, MD;  Location: [facility];  Service: Orthopedics;  Laterality: Right;  . MOHS SURGERY    . TONSILLECTOMY    . TOTAL HIP ARTHROPLASTY    . TOTAL KNEE ARTHROPLASTY  01/22/2013   Procedure: TOTAL KNEE ARTHROPLASTY;  Surgeon: Loanne Drilling, MD;  Location: [facility];  Service: Orthopedics;  Laterality: Right;  Revision of a Right Uni Knee to a Total Knee Arthroplasty    FAMILY HISTORY: Family History  Problem Relation Age of Onset  . Heart disease Mother   . Parkinson's disease Father     SOCIAL HISTORY:  Social History   Socioeconomic History  . Marital status: Married    Spouse name: Not on file  . Number of children: Not on file  . Years of education: Not on file  . Highest education level: Not on file  Occupational History  . Not on file  Tobacco Use  . Smoking status: Never Smoker  . Smokeless tobacco: Never Used  Vaping Use  . Vaping Use: Never used  Substance and Sexual Activity  . Alcohol use: Yes    Alcohol/week: 0.0 standard drinks    Comment:  occasional  . Drug use: No  . Sexual activity: Not on file  Other Topics Concern  . Not on file  Social History Narrative   Lives   Caffeine use:    Social Determinants of Health  Financial Resource Strain: Not on file  Food Insecurity: Not on file  Transportation Needs: Not on file  Physical Activity: Not on file  Stress: Not on file  Social Connections: Not on file  Intimate Partner Violence: Not on file     PHYSICAL EXAM  Vitals:   01/20/21 1104  BP: (!) 174/88  Pulse: 91  Weight: 167 lb (75.8 kg)  Height: 5\' 10"  (1.778 m)    Body mass index is 23.96 kg/m.    General: The patient is well-developed and well-nourished and in no acute distress.  She has a cervical collar  HEENT:  Head is [place]/AT.  Sclera are anicteric.    Neck: No carotid bruits are noted.  The neck is post-op in a cervical collar.    Cardiovascular: The heart has a regular rate and rhythm with a normal S1 and S2. There were no murmurs, gallops or rubs.     Skin: Extremities are without rash or edema.   Mild skin color changes   Musculoskeletal:  Back is nontender  Neurologic Exam  Mental status: She appears bradykinetic. The patient is alert and oriented x 3 at the time of the examination. The patient has mildly reduced recent memory (2/3 spontaneous, category ??, 3/3 list) with reduced attention span and concentration ability (100-93-91-89)   Speech has halting quality but sentence structure is fine.  .Clock numbers are fine but hands not.  Could not do [place], could not draw box.   Cranial nerves: Extraocular movements are full.    Facial symmetry is present. There is good facial sensation to soft touch bilaterally.Facial strength is normal.  Trapezius and sternocleidomastoid strength is normal. No dysarthria is noted.    No obvious hearing deficits are noted.  Motor:  Muscle bulk is normal.   Tone is normal. Strength is  5 / 5 in all 4 extremities.   Sensory: Sensory testing is intact to  pinprick, soft touch and vibration sensation in arms but reduced vibration and touch in feet (80% vib at ankles and 40% at toes)  Coordination: Cerebellar testing reveals good finger-nose-finger and heel-to-shin bilaterally.  Gait and station: Station is normal.   Gait is wider and she turns in 5 steps (was 4 last visit).  She stoops forward.  . Romberg is negative.   Reflexes: Deep tendon reflexes are symmetric and normal bilaterally.   Plantar responses are flexor.    DIAGNOSTIC DATA (LABS, IMAGING, TESTING) - I reviewed patient records, labs, notes, testing and imaging myself where available.  Lab Results  Component Value Date   WBC 5.4 02/20/2018   HGB 12.4 02/20/2018   HCT 37.3 02/20/2018   MCV 88.6 02/20/2018   PLT 205 02/20/2018      Component Value Date/Time   NA 137 02/20/2018 1403   K 4.3 02/20/2018 1403   CL 106 02/20/2018 1403   CO2 23 02/20/2018 1403   GLUCOSE 98 02/20/2018 1403   BUN 25 (H) 02/20/2018 1403   CREATININE 1.05 (H) 02/20/2018 1403   CALCIUM 9.1 02/20/2018 1403   PROT 6.8 01/06/2020 0854   ALBUMIN 3.9 02/20/2018 1403   AST 22 02/20/2018 1403   ALT 22 02/20/2018 1403   ALKPHOS 111 02/20/2018 1403   BILITOT 0.5 02/20/2018 1403   GFRNONAA 52 (L) 02/20/2018 1403   GFRAA >60 02/20/2018 1403       ASSESSMENT AND PLAN  Transient alteration of awareness  Other peripheral vertigo, unspecified ear - Plan: MR BRAIN W WO CONTRAST  Rheumatoid  arthritis, involving unspecified site, unspecified whether rheumatoid factor present ([place])  Trigeminal neuralgia  History of fusion of cervical spine   1.    Since the last visit, she has had more cognitive changes in her speech pattern has changed.  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  I am concerned that with the new parkinsonian features, and cognitive issues, that she may have Lewy body disease.  I do want to check an MRI of the brain to rule out other processes such as subdural hematomas and  strokes. 2.   Stay active and exercise as tolerated. 3.   Continue medications.  45-minute office visit with the majority of the time spent face-to-face for history and physical, discussion/counseling and decision-making.  Additional time with record review and documentation.    A. Felecia Shelling, MD, [facility] [id number], [id number] PM Certified in Neurology, Clinical Neurophysiology, Sleep Medicine and Neuroimaging  [facility] [address], [place], [place] [address] [phone]

## 2021-01-22 ENCOUNTER — Other Ambulatory Visit: Payer: Medicare PPO

## 2021-01-24 ENCOUNTER — Other Ambulatory Visit: Payer: Self-pay

## 2021-01-24 ENCOUNTER — Ambulatory Visit
Admission: RE | Admit: 2021-01-24 | Discharge: 2021-01-24 | Disposition: A | Discharge status: Home / Self Care | Payer: Medicare PPO | Source: Ambulatory Visit | Attending: Neurology | Admitting: Neurology

## 2021-01-24 DIAGNOSIS — H81399 Other peripheral vertigo, unspecified ear: Secondary | ICD-10-CM | POA: Diagnosis not present

## 2021-01-24 MED ORDER — GADOBENATE DIMEGLUMINE 529 MG/ML IV SOLN
15.0000 mL | Freq: Once | INTRAVENOUS | Status: AC | PRN
  Administered 2021-01-24: 15 mL via INTRAVENOUS

## 2021-01-27 ENCOUNTER — Telehealth: Payer: Self-pay | Admitting: *Deleted

## 2021-01-27 NOTE — Telephone Encounter (Signed)
-----   Message from Britt Bottom, MD sent at 01/26/2021  5:24 PM EST ----- Please let her know that the MRI of the brain showed age-related changes but nothing that should cause vertigo or gait issues.

## 2021-01-27 NOTE — Telephone Encounter (Signed)
Called and LVM about results per Dr. Garth Bigness note. Advised she did not have to call back unless she had further questions.

## 2021-04-21 ENCOUNTER — Other Ambulatory Visit: Payer: Self-pay | Admitting: Physician Assistant

## 2021-04-21 ENCOUNTER — Other Ambulatory Visit (HOSPITAL_COMMUNITY): Payer: Self-pay | Admitting: Physician Assistant

## 2021-04-21 DIAGNOSIS — M25551 Pain in right hip: Secondary | ICD-10-CM

## 2021-05-04 ENCOUNTER — Other Ambulatory Visit: Payer: Self-pay

## 2021-05-04 ENCOUNTER — Encounter (HOSPITAL_COMMUNITY)
Admission: RE | Admit: 2021-05-04 | Discharge: 2021-05-04 | Disposition: A | Discharge status: Home / Self Care | Payer: Medicare PPO | Source: Ambulatory Visit | Attending: Physician Assistant | Admitting: Physician Assistant

## 2021-05-04 ENCOUNTER — Ambulatory Visit (HOSPITAL_COMMUNITY)
Admission: RE | Admit: 2021-05-04 | Discharge: 2021-05-04 | Disposition: A | Discharge status: Home / Self Care | Payer: Medicare PPO | Source: Ambulatory Visit | Attending: Physician Assistant | Admitting: Physician Assistant

## 2021-05-04 DIAGNOSIS — M25552 Pain in left hip: Secondary | ICD-10-CM | POA: Insufficient documentation

## 2021-05-04 DIAGNOSIS — M25551 Pain in right hip: Secondary | ICD-10-CM | POA: Diagnosis not present

## 2021-05-04 MED ORDER — TECHNETIUM TC 99M SULFUR COLLOID FILTERED: 2.0000 | Freq: Once | INTRAVENOUS | Status: DC | PRN

## 2021-05-04 MED ORDER — TECHNETIUM TC 99M MEDRONATE IV KIT
20.0000 | PACK | Freq: Once | INTRAVENOUS | Status: AC | PRN
  Administered 2021-05-04: 20 via INTRAVENOUS

## 2021-05-26 ENCOUNTER — Other Ambulatory Visit: Payer: Self-pay | Admitting: Neurology

## 2021-06-03 ENCOUNTER — Ambulatory Visit: Payer: Medicare PPO | Admitting: Neurology

## 2021-06-03 ENCOUNTER — Encounter: Payer: Self-pay | Admitting: Neurology

## 2021-06-03 VITALS — BP 126/64 | HR 103 | Ht 70.0 in | Wt 160.0 lb

## 2021-06-03 DIAGNOSIS — G500 Trigeminal neuralgia: Secondary | ICD-10-CM

## 2021-06-03 DIAGNOSIS — G609 Hereditary and idiopathic neuropathy, unspecified: Secondary | ICD-10-CM

## 2021-06-03 DIAGNOSIS — I951 Orthostatic hypotension: Secondary | ICD-10-CM | POA: Diagnosis not present

## 2021-06-03 DIAGNOSIS — M069 Rheumatoid arthritis, unspecified: Secondary | ICD-10-CM

## 2021-06-03 MED ORDER — PYRIDOSTIGMINE BROMIDE 60 MG PO TABS: ORAL_TABLET | ORAL | 3 refills | Status: DC

## 2021-06-03 MED ORDER — OXCARBAZEPINE 300 MG PO TABS: ORAL_TABLET | ORAL | 3 refills | Status: DC

## 2021-06-03 MED ORDER — LAMOTRIGINE 150 MG PO TABS: 150.0000 mg | ORAL_TABLET | Freq: Every day | ORAL | 3 refills | Status: DC

## 2021-06-03 NOTE — Progress Notes (Signed)
[facility]  PATIENT: Kristen Hansen DOB: [birth year]  REFERRING DOCTOR OR PCP: Cathi Roan, PA-C SOURCE: Patient, notes from primary care, previous neurology notes, imaging and laboratory report,  _________________________________   HISTORICAL  CHIEF COMPLAINT:  Chief Complaint  Patient presents with   Follow-up    Rm 44, with husband, states her walking has improved some, no resent falls, balance is still a concerned, would like to discuss results from resent imaging     HISTORY OF PRESENT ILLNESS:  Kristen Hansen is a 75 year old woman with several ep-isode of transient alteration of awareness, last one in 2021  Update 06/03/21: Since the last visit, she has had multiple spells about  1-4/months that last a couple minute minutes.   She has a swirling sensation but does not lose consciousness.  She feels lightheaded and pushed to the left (translational vertigo).  Most spells occur while standing, others while riding a car and one yesterday while eating.  Putting her head down usually helps. They seem random without triggers.     She was orthostatic last year and mestinon was started with benefit.     MRI brain 01/24/21 showed scattered T2/FLAIR hyperintense foci in the subcortical and deep white matter.  This is most consistent with mild age-related chronic microvascular ischemic change.   Mild atrophy.  The vestibulocochlear nerves appear normal.  She notes a tight sensation in her feet and ankles.      She has trigeminal neuralgia, always on her left.   She is actually doing much better,  She had some other sensory symptoms on the right a couple times.  She is on lamotrigine 150 mg po qd and oxcarbazapine300 mg po tid.  Speech has been less fluent -- halting quality since 07/2020.  She sleeps more than she used to and also has delayed stage sleep (goes to bed around 1 am).     She  notes more trouble with cogntion and is more apathetic.  Denies depression.     She no longer has active dreams since the clonazepam  was added.   This EEG 10/13/2020:  Normal EEG while the patient was awake and drowsy showed mild generalized cortical slowing that could be due to mild underlying cortical dysfunction.  There were no seizures or other epileptiform activity  She had both spinal stenosis in her cervical spine and lumbar spine.   She had cervical spine surgery (C3-C7 ACDF) by Dr Minette Brine [facility]) 07/2020.   She may also need lumbar surgery.    She has RA and was on Enbrel but it was stopped before her 2021 cervical fusion.  Pain has not come back too badly but she notes more joint disfiguration in hands.  Transient Alteration of Awareness History: She  has had 3 episodes of reduced awareness between 2018 and 2021.  In 2018, while at a craft fair she was walking and the next thing she remembers she was unaware on the floor. It was witnessed and a friend said she just dropped (not tripped) and she recalls people around her when she came to.   No tongue biting or incontinence.   She quickly was back to baseline.  Most recently, June 2021, she was with a group of women at a picnic table when her face turned white and she became unresponsive to questions.  She had just stood up at the time it started and others helped her back down.    There was no complete loss of consciousness and  there was no generalized tonic-clonic activity.  She very quickly was talking normally.  Also, she had cervical spine surgery 08/13/2020.  While in physical therapy, standing working on balance, she had an episode where she was unresponsive to questions that lasted 1 to 2 minutes.  There was no generalized tonic-clonic activity.   She has no memory of the actual events but clearly remembers before and after the events.    _______________________________________________  IMAGING: [facility] 02/06/2020 IMPRESSION: This MRI of the lumbar spine without contrast shows the following: 1.    Mild  levoscoliosis. 2.    At L2-L3, there is mild spinal stenosis due to degenerative changes detailed above.  There did not appear to be any nerve root compression. 3.    At L3-L4, there is moderate spinal stenosis due to degenerative changes detailed above but there does not appear to be any nerve root compression. 4.    At L4-L5, there is anterolisthesis and other degenerative changes detailed above causing moderate spinal stenosis and moderately severe left lateral recess stenosis with possible left L5 nerve root compression. 5.    At L5-S1, there are degenerative changes as detailed above causing moderate spinal stenosis but there did not appear to be nerve root compression. 6.    There are milder degenerative changes at T11-T12 and L1-L2 that do not lead to spinal stenosis or nerve root compression.  Cervical Spine 02/06/2020 IMPRESSION: This MRI of the cervical spine without contrast shows the following: 1.   The spinal cord appears normal. 2.   At C3-C4, there is moderate spinal stenosis and mild to moderate bilateral foraminal narrowing but no nerve root compression. 3.   At C4-C5, there is moderate spinal stenosis, right greater than left, mostly due to a right paramedian disc extrusion.  There is moderately severe right foraminal narrowing with possible right C5 nerve root compression. 4.   At C5-C6, there are degenerative changes causing moderate spinal stenosis and moderately severe right foraminal narrowing with possible right C6 nerve root compression. 5.   C6-C7, there are degenerative changes causing moderate spinal stenosis and moderately severe left foraminal narrowing which could lead to left C7 nerve root compression.   REVIEW OF SYSTEMS: Constitutional: No fevers, chills, sweats, or change in appetite Eyes: No visual changes, double vision, eye pain Ear, nose and throat: No hearing loss, ear pain, nasal congestion, sore throat Cardiovascular: No chest pain,  palpitations Respiratory:  No shortness of breath at rest or with exertion.   No wheezes GastrointestinaI: No nausea, vomiting, diarrhea, abdominal pain, fecal incontinence Genitourinary:  No dysuria, urinary retention or frequency.  No nocturia. Musculoskeletal: As above Integumentary: No rash, pruritus, skin lesions Neurological: as above Psychiatric: No depression at this time.  No anxiety Endocrine: No palpitations, diaphoresis, change in appetite, change in weigh or increased thirst Hematologic/Lymphatic:  No anemia, purpura, petechiae. Allergic/Immunologic: No itchy/runny eyes, nasal congestion, recent allergic reactions, rashes  ALLERGIES: Allergies  Allergen Reactions   Morphine And Related Hives   Prevacid [Lansoprazole] Other (See Comments)    unknown   Provigil [Modafinil] Other (See Comments)    dizziness   Adhesive [Tape] Rash    bandaids     HOME MEDICATIONS:  Current Outpatient Medications:    amLODipine-olmesartan (AZOR) 5-40 MG tablet, Take 1 tablet by mouth every evening. , Disp: , Rfl:    clonazePAM (KLONOPIN) 1 MG tablet, TAKE 1 TABLET BY MOUTH AT BEDTIME, Disp: 30 tablet, Rfl: 5   Coenzyme Q10 (CO Q 10  PO), Take 1 Dose by mouth daily., Disp: , Rfl:    Docusate Calcium (STOOL SOFTENER PO), Take 1 tablet by mouth daily., Disp: , Rfl:    imipramine (TOFRANIL) 25 MG tablet, TAKE 1 TABLET BY MOUTH AT BEDTIME, Disp: 90 tablet, Rfl: 3   loratadine (CLARITIN) 10 MG tablet, Take 10 mg by mouth daily., Disp: , Rfl:    mometasone (NASONEX) 50 MCG/ACT nasal spray, Place 1 spray into the nose daily as needed (CONGESTION). , Disp: , Rfl:    montelukast (SINGULAIR) 10 MG tablet, Take 10 mg by mouth at bedtime., Disp: , Rfl:    omeprazole (PRILOSEC) 20 MG capsule, Take 20 mg by mouth daily. , Disp: , Rfl:    pravastatin (PRAVACHOL) 40 MG tablet, Take 40 mg by mouth daily before breakfast. , Disp: , Rfl:    valACYclovir (VALTREX) 500 MG tablet, TAKE 2 TABLETS BY MOUTH EVERY  12 HOURS AS NEEDED FOR FEVER BLISTERS, Disp: , Rfl:    lamoTRIgine (LAMICTAL) 150 MG tablet, Take 1 tablet (150 mg total) by mouth daily. appointment needed for further refills., Disp: 180 tablet, Rfl: 3   Oxcarbazepine (TRILEPTAL) 300 MG tablet, TAKE 1 TABLET BY MOUTH IN THE MORNING AND 1 AT NOON AND 2 AT BEDTIME, Disp: 360 tablet, Rfl: 3   pyridostigmine (MESTINON) 60 MG tablet, Take 2 to 3 a day, Disp: 270 tablet, Rfl: 3  PAST MEDICAL HISTORY: Past Medical History:  Diagnosis Date   Back pain    Cancer (HCC)    Skin, basal cell   Dyspnea    Fatigue    GERD (gastroesophageal reflux disease)    Headache(784.0)    history of migraines   History of kidney stones    Hypercholesteremia    Hypertension    Leg swelling    Bilateral   Occasional tremors    mild hand tremors   Osteoarthritis    PONV (postoperative nausea and vomiting)    RBD (REM behavioral disorder)    Rheumatoid arthritis(714.0)    Scoliosis of lumbar spine    Spinal stenosis    Multiple levels   Tic douloureux    Trigeminal neuralgia 03/30/2015   Vision abnormalities     PAST SURGICAL HISTORY: Past Surgical History:  Procedure Laterality Date   ABDOMINAL HYSTERECTOMY     AUGMENTATION MAMMAPLASTY Bilateral    COLONOSCOPY     JOINT REPLACEMENT     right uni knee, right hip with 2 revisions   KIDNEY STONE SURGERY Bilateral    KNEE ARTHROTOMY Right 02/27/2018   Procedure: RIGHT KNEE ARTHROTOMY; scar excision;  Surgeon: Gaynelle Arabian, MD;  Location: [facility];  Service: Orthopedics;  Laterality: Right;   MOHS SURGERY     TONSILLECTOMY     TOTAL HIP ARTHROPLASTY     TOTAL KNEE ARTHROPLASTY  01/22/2013   Procedure: TOTAL KNEE ARTHROPLASTY;  Surgeon: Gearlean Alf, MD;  Location: [facility];  Service: Orthopedics;  Laterality: Right;  Revision of a Right Uni Knee to a Total Knee Arthroplasty    FAMILY HISTORY: Family History  Problem Relation Age of Onset   Heart disease Mother    Parkinson's disease Father      SOCIAL HISTORY:  Social History   Socioeconomic History   Marital status: Married    Spouse name: Not on file   Number of children: Not on file   Years of education: Not on file   Highest education level: Not on file  Occupational History   Not on  file  Tobacco Use   Smoking status: Never   Smokeless tobacco: Never  Vaping Use   Vaping Use: Never used  Substance and Sexual Activity   Alcohol use: Yes    Alcohol/week: 0.0 standard drinks    Comment: occasional   Drug use: No   Sexual activity: Not on file  Other Topics Concern   Not on file  Social History Narrative   Lives   Caffeine use:    Social Determinants of Health   Financial Resource Strain: Not on file  Food Insecurity: Not on file  Transportation Needs: Not on file  Physical Activity: Not on file  Stress: Not on file  Social Connections: Not on file  Intimate Partner Violence: Not on file     PHYSICAL EXAM  Vitals:   06/03/21 0913  BP: 126/64  Pulse: (!) 103  Weight: 160 lb (72.6 kg)  Height: '5\' 10"'  (1.778 m)    Body mass index is 22.96 kg/m.    General: The patient is well-developed and well-nourished and in no acute distress.  She has a cervical collar  HEENT:  Head is /AT.  Sclera are anicteric.    Skin: Extremities are without rash or edema.   Mild skin color changes    Neurologic Exam  Mental status: She appears bradykinetic. The patient is alert and oriented x 3 at the time of the examination. The patient has mildly reduced recent memory  with reduced attention span qualitatively   Speech has halting quality but sentence structure is fine.    Cranial nerves: Extraocular movements are full.   Facial strength and sensation are normal.   Trapezius and sternocleidomastoid strength is normal. No dysarthria is noted.    No obvious hearing deficits are noted, but Weber lateralizes to the left.  Motor:  Muscle bulk is normal.   Tone is normal. Strength is  5 / 5 in all 4 extremities.    Sensory: Sensory testing is intact to pinprick, soft touch and vibration sensation in arms but reduced vibration and touch in feet (80-90% vib at ankles and 40% at toes)  Coordination: Cerebellar testing reveals good finger-nose-finger and heel-to-shin bilaterally.  Gait and station: Station is normal.   Gait is wider and she turns in 5 steps (was 4 last visit).  She has retropulsion. Romberg is negative.   Reflexes: Deep tendon reflexes are symmetric and normal bilaterally.       DIAGNOSTIC DATA (LABS, IMAGING, TESTING) - I reviewed patient records, labs, notes, testing and imaging myself where available.  Lab Results  Component Value Date   WBC 5.4 02/20/2018   HGB 12.4 02/20/2018   HCT 37.3 02/20/2018   MCV 88.6 02/20/2018   PLT 205 02/20/2018      Component Value Date/Time   NA 137 02/20/2018 1403   K 4.3 02/20/2018 1403   CL 106 02/20/2018 1403   CO2 23 02/20/2018 1403   GLUCOSE 98 02/20/2018 1403   BUN 25 (H) 02/20/2018 1403   CREATININE 1.05 (H) 02/20/2018 1403   CALCIUM 9.1 02/20/2018 1403   PROT 6.8 01/06/2020 0854   ALBUMIN 3.9 02/20/2018 1403   AST 22 02/20/2018 1403   ALT 22 02/20/2018 1403   ALKPHOS 111 02/20/2018 1403   BILITOT 0.5 02/20/2018 1403   GFRNONAA 52 (L) 02/20/2018 1403   GFRAA >60 02/20/2018 1403       ASSESSMENT AND PLAN  Idiopathic peripheral neuropathy - Plan: CBC with Differential/Platelet, Comprehensive metabolic panel, Vitamin G64, Multiple Myeloma  Panel (SPEP&IFE w/QIG), Sjogren's syndrome antibods(ssa + ssb)  Trigeminal neuralgia  Orthostatic hypotension  Rheumatoid arthritis, involving unspecified site, unspecified whether rheumatoid factor present (HCC) - Plan: CBC with Differential/Platelet, Comprehensive metabolic panel, Vitamin B70, Multiple Myeloma Panel (SPEP&IFE w/QIG), Sjogren's syndrome antibods(ssa + ssb)   1.    Since the last visit, she has had more cognitive changes and her speech pattern has changed.  Gait has  worsened - with a reduced stride and retropulsion.  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  Dizzy spells likely due to orthostatic hypotension (improved on mestinon). ----  She likely has Lewy body disease.    We discussed increasing activity and eating well.  2.   Stay active and exercise as tolerated. 3.   Continue medications. 4.  Rtc 6 months or sooner if new or worsening issues  45-minute office visit with the majority of the time spent face-to-face for history and physical, discussion/counseling and decision-making.  Additional time with record review and documentation.    A. Felecia Shelling, MD, [facility] 4/88/8916, 9:45 PM Certified in Neurology, Clinical Neurophysiology, Sleep Medicine and Neuroimaging  [facility] [address], [place], [place] [address] [phone]

## 2021-06-07 LAB — COMPREHENSIVE METABOLIC PANEL
ALT: 10 IU/L (ref 0–32)
AST: 11 IU/L (ref 0–40)
Albumin/Globulin Ratio: 2 (ref 1.2–2.2)
Albumin: 4.3 g/dL (ref 3.7–4.7)
Alkaline Phosphatase: 129 IU/L — ABNORMAL HIGH (ref 44–121)
BUN/Creatinine Ratio: 27 (ref 12–28)
BUN: 31 mg/dL — ABNORMAL HIGH (ref 8–27)
Bilirubin Total: <0.2 mg/dL (ref 0.0–1.2)
CO2: 24 mmol/L (ref 20–29)
Calcium: 9.5 mg/dL (ref 8.7–10.3)
Chloride: 103 mmol/L (ref 96–106)
Creatinine, Ser: 1.16 mg/dL — ABNORMAL HIGH (ref 0.57–1.00)
Globulin, Total: 2.1 g/dL (ref 1.5–4.5)
Glucose: 90 mg/dL (ref 65–99)
Potassium: 4.5 mmol/L (ref 3.5–5.2)
Sodium: 139 mmol/L (ref 134–144)
Total Protein: 6.4 g/dL (ref 6.0–8.5)
eGFR: 49 mL/min/{1.73_m2} — ABNORMAL LOW (ref >59)

## 2021-06-07 LAB — CBC WITH DIFFERENTIAL/PLATELET
Basophils Absolute: 0 10*3/uL (ref 0.0–0.2)
Basos: 1 %
EOS (ABSOLUTE): 0.1 10*3/uL (ref 0.0–0.4)
Eos: 2 %
Hematocrit: 32.3 % — ABNORMAL LOW (ref 34.0–46.6)
Hemoglobin: 10.7 g/dL — ABNORMAL LOW (ref 11.1–15.9)
Immature Grans (Abs): 0 10*3/uL (ref 0.0–0.1)
Immature Granulocytes: 0 %
Lymphocytes Absolute: 1.3 10*3/uL (ref 0.7–3.1)
Lymphs: 27 %
MCH: 29.3 pg (ref 26.6–33.0)
MCHC: 33.1 g/dL (ref 31.5–35.7)
MCV: 89 fL (ref 79–97)
Monocytes Absolute: 0.6 10*3/uL (ref 0.1–0.9)
Monocytes: 12 %
Neutrophils Absolute: 2.7 10*3/uL (ref 1.4–7.0)
Neutrophils: 58 %
Platelets: 211 10*3/uL (ref 150–450)
RBC: 3.65 x10E6/uL — ABNORMAL LOW (ref 3.77–5.28)
RDW: 12.5 % (ref 11.7–15.4)
WBC: 4.7 10*3/uL (ref 3.4–10.8)

## 2021-06-07 LAB — MULTIPLE MYELOMA PANEL, SERUM
Albumin SerPl Elph-Mcnc: 4 g/dL (ref 2.9–4.4)
Albumin/Glob SerPl: 1.7 (ref 0.7–1.7)
Alpha 1: 0.2 g/dL (ref 0.0–0.4)
Alpha2 Glob SerPl Elph-Mcnc: 0.6 g/dL (ref 0.4–1.0)
B-Globulin SerPl Elph-Mcnc: 0.9 g/dL (ref 0.7–1.3)
Gamma Glob SerPl Elph-Mcnc: 0.6 g/dL (ref 0.4–1.8)
Globulin, Total: 2.4 g/dL (ref 2.2–3.9)
IgA/Immunoglobulin A, Serum: 212 mg/dL (ref 64–422)
IgG (Immunoglobin G), Serum: 783 mg/dL (ref 586–1602)
IgM (Immunoglobulin M), Srm: 100 mg/dL (ref 26–217)

## 2021-06-07 LAB — SJOGREN'S SYNDROME ANTIBODS(SSA + SSB)
ENA SSA (RO) Ab: <0.2 AI (ref 0.0–0.9)
ENA SSB (LA) Ab: <0.2 AI (ref 0.0–0.9)

## 2021-06-07 LAB — VITAMIN B12: Vitamin B-12: 330 pg/mL (ref 232–1245)

## 2021-06-09 ENCOUNTER — Ambulatory Visit: Payer: Medicare PPO | Admitting: Neurology

## 2021-06-24 ENCOUNTER — Emergency Department (HOSPITAL_BASED_OUTPATIENT_CLINIC_OR_DEPARTMENT_OTHER)
Admission: EM | Admit: 2021-06-24 | Discharge: 2021-06-25 | Disposition: A | Discharge status: Home / Self Care | Payer: Medicare PPO | Attending: Emergency Medicine | Admitting: Emergency Medicine

## 2021-06-24 ENCOUNTER — Other Ambulatory Visit: Payer: Self-pay

## 2021-06-24 ENCOUNTER — Encounter (HOSPITAL_BASED_OUTPATIENT_CLINIC_OR_DEPARTMENT_OTHER): Payer: Self-pay | Admitting: *Deleted

## 2021-06-24 DIAGNOSIS — Z85828 Personal history of other malignant neoplasm of skin: Secondary | ICD-10-CM | POA: Diagnosis not present

## 2021-06-24 DIAGNOSIS — Z96641 Presence of right artificial hip joint: Secondary | ICD-10-CM | POA: Diagnosis not present

## 2021-06-24 DIAGNOSIS — Z79899 Other long term (current) drug therapy: Secondary | ICD-10-CM | POA: Diagnosis not present

## 2021-06-24 DIAGNOSIS — Z23 Encounter for immunization: Secondary | ICD-10-CM | POA: Diagnosis not present

## 2021-06-24 DIAGNOSIS — W57XXXA Bitten or stung by nonvenomous insect and other nonvenomous arthropods, initial encounter: Secondary | ICD-10-CM | POA: Diagnosis not present

## 2021-06-24 DIAGNOSIS — Z96651 Presence of right artificial knee joint: Secondary | ICD-10-CM | POA: Insufficient documentation

## 2021-06-24 DIAGNOSIS — Y92007 Garden or yard of unspecified non-institutional (private) residence as the place of occurrence of the external cause: Secondary | ICD-10-CM | POA: Diagnosis not present

## 2021-06-24 DIAGNOSIS — S60862A Insect bite (nonvenomous) of left wrist, initial encounter: Secondary | ICD-10-CM | POA: Insufficient documentation

## 2021-06-24 DIAGNOSIS — T63481A Toxic effect of venom of other arthropod, accidental (unintentional), initial encounter: Secondary | ICD-10-CM

## 2021-06-24 NOTE — ED Triage Notes (Signed)
C/o ? Insect bite to left hand with redness and swelling x 1 day

## 2021-06-24 NOTE — ED Notes (Signed)
ED Provider at bedside. 

## 2021-06-24 NOTE — ED Provider Notes (Signed)
[facility] Provider Note: Georgena Spurling, MD, FACEP  CSN: [id number] MRN: [id number] ARRIVAL: 06/24/21 at 2130 ROOM: [facility] PRESENT ILLNESS  06/24/21 11:56 PM Kristen Hansen is a 75 y.o. female with a history of rheumatoid arthritis on Enbrel.  She was working in her garden recently.  She is not aware of being bitten or stung by any insect but about noon today she noticed the back of her left wrist was hurting.  She rates the pain as an 8 out of 10 and is worse with any movement or palpation.  There is some associated swelling and some mild erythema.  She denies any systemic symptoms such as fever, nausea, vomiting, shortness of breath or chest pain.   Past Medical History:  Diagnosis Date   Back pain    Cancer ([place])    Skin, basal cell   Dyspnea    Fatigue    GERD (gastroesophageal reflux disease)    Headache(784.0)    history of migraines   History of kidney stones    Hypercholesteremia    Hypertension    Leg swelling    Bilateral   Occasional tremors    mild hand tremors   Osteoarthritis    PONV (postoperative nausea and vomiting)    RBD (REM behavioral disorder)    Rheumatoid arthritis(714.0)    Scoliosis of lumbar spine    Spinal stenosis    Multiple levels   Tic douloureux    Trigeminal neuralgia 03/30/2015   Vision abnormalities     Past Surgical History:  Procedure Laterality Date   ABDOMINAL HYSTERECTOMY     AUGMENTATION MAMMAPLASTY Bilateral    COLONOSCOPY     JOINT REPLACEMENT     right uni knee, right hip with 2 revisions   KIDNEY STONE SURGERY Bilateral    KNEE ARTHROTOMY Right 02/27/2018   Procedure: RIGHT KNEE ARTHROTOMY; scar excision;  Surgeon: Gaynelle Arabian, MD;  Location: [facility];  Service: Orthopedics;  Laterality: Right;   MOHS SURGERY     TONSILLECTOMY     TOTAL HIP ARTHROPLASTY     TOTAL KNEE ARTHROPLASTY  01/22/2013   Procedure: TOTAL KNEE ARTHROPLASTY;  Surgeon: Gearlean Alf, MD;  Location: [facility];  Service: Orthopedics;  Laterality: Right;  Revision of a Right Uni Knee to a Total Knee Arthroplasty    Family History  Problem Relation Age of Onset   Heart disease Mother    Parkinson's disease Father     Social History   Tobacco Use   Smoking status: Never   Smokeless tobacco: Never  Vaping Use   Vaping Use: Never used  Substance Use Topics   Alcohol use: Yes    Alcohol/week: 0.0 standard drinks    Comment: occasional   Drug use: No    Prior to Admission medications   Medication Sig Start Date End Date Taking? Authorizing Provider  acetaminophen-codeine (TYLENOL #3) 300-30 MG tablet Take 1-2 tablets by mouth every 6 (six) hours as needed (for pain). 06/25/21  Yes , , MD  ondansetron (ZOFRAN ODT) 8 MG disintegrating tablet Take 1 tablet (8 mg total) by mouth every 8 (eight) hours as needed for nausea or vomiting. 06/25/21  Yes , , MD  amLODipine-olmesartan (AZOR) 5-40 MG tablet Take 1 tablet by mouth every evening.     [provider]  clonazePAM (KLONOPIN) 1 MG tablet TAKE 1 TABLET BY MOUTH AT BEDTIME 05/26/21   Sater,  Richard A, MD  Coenzyme Q10 (CO Q 10 PO) Take 1 Dose by mouth daily.    [provider]  Docusate Calcium (STOOL SOFTENER PO) Take 1 tablet by mouth daily.    [provider]  imipramine (TOFRANIL) 25 MG tablet TAKE 1 TABLET BY MOUTH AT BEDTIME 10/26/20   Sater, Nanine Means, MD  lamoTRIgine (LAMICTAL) 150 MG tablet Take 1 tablet (150 mg total) by mouth daily. appointment needed for further refills. 06/03/21   Sater, Nanine Means, MD  loratadine (CLARITIN) 10 MG tablet Take 10 mg by mouth daily.    [provider]  mometasone (NASONEX) 50 MCG/ACT nasal spray Place 1 spray into the nose daily as needed (CONGESTION).     [provider]  montelukast (SINGULAIR) 10 MG tablet Take 10 mg by mouth at bedtime.    [provider]  omeprazole (PRILOSEC) 20 MG capsule Take 20 mg by  mouth daily.     [provider]  Oxcarbazepine (TRILEPTAL) 300 MG tablet TAKE 1 TABLET BY MOUTH IN THE MORNING AND 1 AT NOON AND 2 AT BEDTIME 06/03/21   Sater, Nanine Means, MD  pravastatin (PRAVACHOL) 40 MG tablet Take 40 mg by mouth daily before breakfast.     [provider]  pyridostigmine (MESTINON) 60 MG tablet Take 2 to 3 a day 06/03/21   Sater, Nanine Means, MD  valACYclovir (VALTREX) 500 MG tablet TAKE 2 TABLETS BY MOUTH EVERY 12 HOURS AS NEEDED FOR FEVER BLISTERS    [provider]    Allergies Morphine and related, Prevacid [lansoprazole], Provigil [modafinil], and Adhesive [tape]   REVIEW OF SYSTEMS  Negative except as noted here or in the History of Present Illness.   PHYSICAL EXAMINATION  Initial Vital Signs Blood pressure (!) 170/84, pulse 88, temperature 98.6 F (37 C), temperature source Oral, resp. rate 20, height 5\' 10"  (1.778 m), weight 74.8 kg, SpO2 100 %.  Examination General: Well-developed, well-nourished female in no acute distress; appearance consistent with age of record HENT: normocephalic; atraumatic Eyes: Normal appearance Neck: supple Heart: regular rate and rhythm Lungs: clear to auscultation bilaterally Abdomen: soft; nondistended; nontender; bowel sounds present Extremities: Chronic appearing joint deformities of the hands; tenderness (even to light touch) and swelling of dorsal left wrist with mild erythema and minimal warmth:    Neurologic: Awake, alert and oriented; motor function intact in all extremities and symmetric; no facial droop Skin: Warm and dry Psychiatric: Normal mood and affect   RESULTS  Summary of this visit's results, reviewed and interpreted by myself:   EKG Interpretation  Date/Time:    Ventricular Rate:    PR Interval:    QRS Duration:   QT Interval:    QTC Calculation:   R Axis:     Text Interpretation:          Laboratory Studies: No results found for this or any previous visit (from  the past 24 hour(s)). Imaging Studies: No results found.  ED COURSE and MDM  Nursing notes, initial and subsequent vitals signs, including pulse oximetry, reviewed and interpreted by myself.  Vitals:   06/24/21 2139 06/24/21 2139  BP:  (!) 170/84  Pulse:  88  Resp:  20  Temp:  98.6 F (37 C)  TempSrc:  Oral  SpO2:  100%  Weight: 74.8 kg   Height: 5\' 10"  (1.778 m)    Medications  acetaminophen-codeine (TYLENOL #3) 300-30 MG per tablet 1 tablet (has no administration in time range)  ondansetron (ZOFRAN-ODT)  disintegrating tablet 8 mg (has no administration in time range)    Presentation is consistent with an insect sting but no sting or bite was witnessed.  It is not itching and does not appear to be an allergic reaction.  I suspect some sort of toxin is causing her pain.  We will treat with tramadol.  She was advised to return if redness and erythema increase suggesting an infection.  PROCEDURES  Procedures   ED DIAGNOSES     ICD-10-CM   1. Insect stings, accidental or unintentional, initial encounter  T63.481A          Shanon Rosser, MD 06/25/21 [phone]

## 2021-06-25 MED ORDER — ACETAMINOPHEN-CODEINE #3 300-30 MG PO TABS: 1.0000 | ORAL_TABLET | Freq: Four times a day (QID) | ORAL | 0 refills | Status: DC | PRN

## 2021-06-25 MED ORDER — ONDANSETRON 4 MG PO TBDP
8.0000 mg | ORAL_TABLET | Freq: Once | ORAL | Status: AC
  Administered 2021-06-25: 8 mg via ORAL
  Filled 2021-06-25: qty 2

## 2021-06-25 MED ORDER — ACETAMINOPHEN-CODEINE #3 300-30 MG PO TABS
1.0000 | ORAL_TABLET | Freq: Once | ORAL | Status: AC
  Administered 2021-06-25: 1 via ORAL
  Filled 2021-06-25: qty 1

## 2021-06-25 MED ORDER — TRAMADOL HCL 50 MG PO TABS
50.0000 mg | ORAL_TABLET | Freq: Once | ORAL | Status: DC
  Filled 2021-06-25: qty 1

## 2021-06-25 MED ORDER — ONDANSETRON 8 MG PO TBDP: 8.0000 mg | ORAL_TABLET | Freq: Three times a day (TID) | ORAL | 0 refills | Status: DC | PRN

## 2021-06-25 MED ORDER — TETANUS-DIPHTH-ACELL PERTUSSIS 5-2.5-18.5 LF-MCG/0.5 IM SUSY
0.5000 mL | PREFILLED_SYRINGE | Freq: Once | INTRAMUSCULAR | Status: AC
  Administered 2021-06-25: 0.5 mL via INTRAMUSCULAR
  Filled 2021-06-25: qty 0.5

## 2021-08-23 DIAGNOSIS — E782 Mixed hyperlipidemia: Secondary | ICD-10-CM | POA: Diagnosis present

## 2021-09-22 DIAGNOSIS — I82539 Chronic embolism and thrombosis of unspecified popliteal vein: Secondary | ICD-10-CM | POA: Diagnosis present

## 2021-11-21 ENCOUNTER — Other Ambulatory Visit: Payer: Self-pay | Admitting: Internal Medicine

## 2021-11-21 DIAGNOSIS — Z1231 Encounter for screening mammogram for malignant neoplasm of breast: Secondary | ICD-10-CM

## 2021-12-06 ENCOUNTER — Ambulatory Visit: Payer: Medicare PPO | Admitting: Neurology

## 2021-12-06 ENCOUNTER — Encounter: Payer: Self-pay | Admitting: Neurology

## 2021-12-06 VITALS — BP 159/77 | HR 74 | Ht 70.0 in | Wt 159.5 lb

## 2021-12-06 DIAGNOSIS — I951 Orthostatic hypotension: Secondary | ICD-10-CM

## 2021-12-06 DIAGNOSIS — G4752 REM sleep behavior disorder: Secondary | ICD-10-CM | POA: Diagnosis not present

## 2021-12-06 DIAGNOSIS — G500 Trigeminal neuralgia: Secondary | ICD-10-CM | POA: Diagnosis not present

## 2021-12-06 DIAGNOSIS — G609 Hereditary and idiopathic neuropathy, unspecified: Secondary | ICD-10-CM

## 2021-12-06 DIAGNOSIS — R479 Unspecified speech disturbances: Secondary | ICD-10-CM | POA: Insufficient documentation

## 2021-12-06 DIAGNOSIS — F32A Depression, unspecified: Secondary | ICD-10-CM

## 2021-12-06 DIAGNOSIS — M069 Rheumatoid arthritis, unspecified: Secondary | ICD-10-CM

## 2021-12-06 MED ORDER — SERTRALINE HCL 50 MG PO TABS: 50.0000 mg | ORAL_TABLET | Freq: Every day | ORAL | 3 refills | Status: DC

## 2021-12-06 MED ORDER — CLONAZEPAM 1 MG PO TABS: 1.0000 mg | ORAL_TABLET | Freq: Every day | ORAL | 5 refills | Status: DC

## 2021-12-06 NOTE — Progress Notes (Addendum)
[facility]  PATIENT: Kristen Hansen DOB: 08-05-1946  REFERRING DOCTOR OR PCP: Cathi Roan, PA-C SOURCE: Patient, notes from primary care, previous neurology notes, imaging and laboratory report,  _________________________________   HISTORICAL  CHIEF COMPLAINT:  Chief Complaint  Patient presents with   Follow-up    Room 2. Pt here with husband. Pt reports she is doing well.     HISTORY OF PRESENT ILLNESS:  Kristen Hansen is a 75 year old woman with several ep-isode of transient alteration of awareness, last one in 2021  Update 12/06/21: Since the last visit, se feels mostly stable    She has more difficulty due toher hip than neuropaty.  She is falling asleep more frequently during the day.    In the past, she had a PSG and MSLT.  She had no OSA and hypersomnia.  She was placed on Provigil - however she could not tolerate it due to vertigo and feeling bad.   On clonazepam she has restful sleep and no RBD with kicking and mumbling.   A few times, she got out of bed.     She has only rare spells of lightheadedness now since srtarting pyridostigmine.    MRI brain 01/24/21 showed scattered T2/FLAIR hyperintense foci in the subcortical and deep white matter.  This is most consistent with mild age-related chronic microvascular ischemic change.   Mild atrophy.  The vestibulocochlear nerves appear normal.   She has trigeminal neuralgia, always on her left.   She is actually doing much better,  She had some other sensory symptoms on the right a couple times.  She is on lamotrigine 150 mg po qd and oxcarbazapine300 mg po tid.  She has subltle cognitive issues:  Speech has a halting quality since 07/2020.  She sometimes has trouble coming up with the right words.   Sometimes she asks the same quesitons over again.  She gets frustrated easily as more trouble keeping up with activities.   She notes some  depression.    She no longer has active dreams since the clonazepam  was  added.   She has RA and was on Enbrel but it was stopped before her 2021 cervical fusion.  Pain has not come back too badly but she notes more joint disfiguration in hands.  Transient Alteration of Awareness History: She  has had 3 episodes of reduced awareness between 2018 and 2021.  In 2018, while at a craft fair she was walking and the next thing she remembers she was unaware on the floor. It was witnessed and a friend said she just dropped (not tripped) and she recalls people around her when she came to.   No tongue biting or incontinence.   She quickly was back to baseline.  Most recently, June 2021, she was with a group of women at a picnic table when her face turned white and she became unresponsive to questions.  She had just stood up at the time it started and others helped her back down.    There was no complete loss of consciousness and there was no generalized tonic-clonic activity.  She very quickly was talking normally.  Also, she had cervical spine surgery 08/13/2020.  While in physical therapy, standing working on balance, she had an episode where she was unresponsive to questions that lasted 1 to 2 minutes.  There was no generalized tonic-clonic activity.   She has no memory of the actual events but clearly remembers before and after the events.  EEG 10/13/2020:  Normal EEG while the patient was awake and drowsy showed mild generalized cortical slowing that could be due to mild underlying cortical dysfunction.  There were no seizures or other epileptiform activity  MRI Brain 01/24/2021 showed fairly mild atrophy and mild SVID.    _______________________________________________  IMAGING: [facility] 02/06/2020 IMPRESSION: This MRI of the lumbar spine without contrast shows the following: 1.    Mild levoscoliosis. 2.    At L2-L3, there is mild spinal stenosis due to degenerative changes detailed above.  There did not appear to be any nerve root compression. 3.    At L3-L4, there is  moderate spinal stenosis due to degenerative changes detailed above but there does not appear to be any nerve root compression. 4.    At L4-L5, there is anterolisthesis and other degenerative changes detailed above causing moderate spinal stenosis and moderately severe left lateral recess stenosis with possible left L5 nerve root compression. 5.    At L5-S1, there are degenerative changes as detailed above causing moderate spinal stenosis but there did not appear to be nerve root compression. 6.    There are milder degenerative changes at T11-T12 and L1-L2 that do not lead to spinal stenosis or nerve root compression.  Cervical Spine 02/06/2020 IMPRESSION: This MRI of the cervical spine without contrast shows the following: 1.   The spinal cord appears normal. 2.   At C3-C4, there is moderate spinal stenosis and mild to moderate bilateral foraminal narrowing but no nerve root compression. 3.   At C4-C5, there is moderate spinal stenosis, right greater than left, mostly due to a right paramedian disc extrusion.  There is moderately severe right foraminal narrowing with possible right C5 nerve root compression. 4.   At C5-C6, there are degenerative changes causing moderate spinal stenosis and moderately severe right foraminal narrowing with possible right C6 nerve root compression. 5.   C6-C7, there are degenerative changes causing moderate spinal stenosis and moderately severe left foraminal narrowing which could lead to left C7 nerve root compression.   REVIEW OF SYSTEMS: Constitutional: No fevers, chills, sweats, or change in appetite Eyes: No visual changes, double vision, eye pain Ear, nose and throat: No hearing loss, ear pain, nasal congestion, sore throat Cardiovascular: No chest pain, palpitations Respiratory:  No shortness of breath at rest or with exertion.   No wheezes GastrointestinaI: No nausea, vomiting, diarrhea, abdominal pain, fecal incontinence Genitourinary:  No dysuria, urinary  retention or frequency.  No nocturia. Musculoskeletal: As above Integumentary: No rash, pruritus, skin lesions Neurological: as above Psychiatric: No depression at this time.  No anxiety Endocrine: No palpitations, diaphoresis, change in appetite, change in weigh or increased thirst Hematologic/Lymphatic:  No anemia, purpura, petechiae. Allergic/Immunologic: No itchy/runny eyes, nasal congestion, recent allergic reactions, rashes  ALLERGIES: Allergies  Allergen Reactions   Morphine And Related Hives   Prevacid [Lansoprazole] Other (See Comments)    unknown   Provigil [Modafinil] Other (See Comments)    dizziness   Adhesive [Tape] Rash    bandaids     HOME MEDICATIONS:  Current Outpatient Medications:    acetaminophen-codeine (TYLENOL #3) 300-30 MG tablet, Take 1-2 tablets by mouth every 6 (six) hours as needed (for pain)., Disp: 30 tablet, Rfl: 0   amLODipine-olmesartan (AZOR) 5-40 MG tablet, Take 1 tablet by mouth every evening. , Disp: , Rfl:    clonazePAM (KLONOPIN) 1 MG tablet, TAKE 1 TABLET BY MOUTH AT BEDTIME, Disp: 30 tablet, Rfl: 5   Coenzyme Q10 (CO Q  10 PO), Take 1 Dose by mouth daily., Disp: , Rfl:    Docusate Calcium (STOOL SOFTENER PO), Take 1 tablet by mouth daily., Disp: , Rfl:    ergocalciferol (VITAMIN D2) 1.25 MG (50000 UT) capsule, ergocalciferol (vitamin D2) 1,250 mcg (50,000 unit) capsule  TAKE 1 CAPSULE BY MOUTH ONCE A WEEK FOR 12 WEEKS, Disp: , Rfl:    etanercept (ENBREL SURECLICK) 50 MG/ML injection, 1 injection, Disp: , Rfl:    lamoTRIgine (LAMICTAL) 150 MG tablet, Take 1 tablet (150 mg total) by mouth daily. appointment needed for further refills., Disp: 180 tablet, Rfl: 3   loratadine (CLARITIN) 10 MG tablet, Take 10 mg by mouth daily., Disp: , Rfl:    mometasone (NASONEX) 50 MCG/ACT nasal spray, Place 1 spray into the nose daily as needed (CONGESTION). , Disp: , Rfl:    montelukast (SINGULAIR) 10 MG tablet, Take 10 mg by mouth at bedtime., Disp: , Rfl:     omeprazole (PRILOSEC) 20 MG capsule, Take 20 mg by mouth daily. , Disp: , Rfl:    ondansetron (ZOFRAN ODT) 8 MG disintegrating tablet, Take 1 tablet (8 mg total) by mouth every 8 (eight) hours as needed for nausea or vomiting., Disp: 10 tablet, Rfl: 0   Oxcarbazepine (TRILEPTAL) 300 MG tablet, TAKE 1 TABLET BY MOUTH IN THE MORNING AND 1 AT NOON AND 2 AT BEDTIME, Disp: 360 tablet, Rfl: 3   pravastatin (PRAVACHOL) 40 MG tablet, Take 40 mg by mouth daily before breakfast. , Disp: , Rfl:    pyridostigmine (MESTINON) 60 MG tablet, Take 2 to 3 a day, Disp: 270 tablet, Rfl: 3   sertraline (ZOLOFT) 50 MG tablet, Take 1 tablet (50 mg total) by mouth daily., Disp: 90 tablet, Rfl: 3   valACYclovir (VALTREX) 500 MG tablet, TAKE 2 TABLETS BY MOUTH EVERY 12 HOURS AS NEEDED FOR FEVER BLISTERS, Disp: , Rfl:   PAST MEDICAL HISTORY: Past Medical History:  Diagnosis Date   Back pain    Cancer ([place])    Skin, basal cell   Dyspnea    Fatigue    GERD (gastroesophageal reflux disease)    Headache(784.0)    history of migraines   History of kidney stones    Hypercholesteremia    Hypertension    Leg swelling    Bilateral   Occasional tremors    mild hand tremors   Osteoarthritis    PONV (postoperative nausea and vomiting)    RBD (REM behavioral disorder)    Rheumatoid arthritis(714.0)    Scoliosis of lumbar spine    Spinal stenosis    Multiple levels   Tic [place]    Trigeminal neuralgia 03/30/2015   Vision abnormalities     PAST SURGICAL HISTORY: Past Surgical History:  Procedure Laterality Date   ABDOMINAL HYSTERECTOMY     AUGMENTATION MAMMAPLASTY Bilateral    COLONOSCOPY     JOINT REPLACEMENT     right uni knee, right hip with 2 revisions   KIDNEY STONE SURGERY Bilateral    KNEE ARTHROTOMY Right 02/27/2018   Procedure: RIGHT KNEE ARTHROTOMY; scar excision;  Surgeon: Gaynelle Arabian, MD;  Location: [facility];  Service: Orthopedics;  Laterality: Right;   MOHS SURGERY     TONSILLECTOMY     TOTAL  HIP ARTHROPLASTY     TOTAL KNEE ARTHROPLASTY  01/22/2013   Procedure: TOTAL KNEE ARTHROPLASTY;  Surgeon: Gearlean Alf, MD;  Location: [facility];  Service: Orthopedics;  Laterality: Right;  Revision of a Right Uni Knee to a Total  Knee Arthroplasty    FAMILY HISTORY: Family History  Problem Relation Age of Onset   Heart disease Mother    Parkinson's disease Father     SOCIAL HISTORY:  Social History   Socioeconomic History   Marital status: Married    Spouse name: Not on file   Number of children: Not on file   Years of education: Not on file   Highest education level: Not on file  Occupational History   Not on file  Tobacco Use   Smoking status: Never   Smokeless tobacco: Never  Vaping Use   Vaping Use: Never used  Substance and Sexual Activity   Alcohol use: Yes    Alcohol/week: 0.0 standard drinks    Comment: occasional   Drug use: No   Sexual activity: Not on file  Other Topics Concern   Not on file  Social History Narrative   Lives   Caffeine use:    Social Determinants of Health   Financial Resource Strain: Not on file  Food Insecurity: Not on file  Transportation Needs: Not on file  Physical Activity: Not on file  Stress: Not on file  Social Connections: Not on file  Intimate Partner Violence: Not on file     PHYSICAL EXAM  Vitals:   12/06/21 0944  BP: (!) 159/77  Pulse: 74  Weight: 159 lb 8 oz (72.3 kg)  Height: 5\' 10"  (1.778 m)    Body mass index is 22.89 kg/m.    General: The patient is well-developed and well-nourished and in no acute distress.  She has a cervical collar  HEENT:  Head is [place]/AT.  Sclera are anicteric.    Skin: Extremities are without rash or edema.   Mild skin color changes    Neurologic Exam  Mental status: She appears bradykinetic. The patient is alert and oriented x 3 at the time of the examination. The patient has mildly reduced recent memory  with reduced attention span qualitatively   Speech has halting quality  but sentence structure is fine.    Cranial nerves: Extraocular movements are full.   Facial strength and sensation are normal.   Trapezius and sternocleidomastoid strength is normal. No dysarthria is noted.    No obvious hearing deficits are noted, but Weber lateralizes to the left.  Motor:  Muscle bulk is normal.   Tone is normal. Strength is  5 / 5 in all 4 extremities.   Sensory: Sensory testing is intact to pinprick, soft touch and vibration sensation in arms but reduced vibration and touch in feet (80-90% vib at ankles and 40% at toes)  Coordination: Cerebellar testing reveals good finger-nose-finger and heel-to-shin bilaterally.  Gait and station: Station is normal.   The gait is wide and she is able to return in 5 steps, the same as last visit.  There was no retropulsion with mild or moderate disturbance but some with larger disturbance of station. Romberg is negative.   Reflexes: Deep tendon reflexes are symmetric and normal bilaterally.       DIAGNOSTIC DATA (LABS, IMAGING, TESTING) - I reviewed patient records, labs, notes, testing and imaging myself where available.  Lab Results  Component Value Date   WBC 4.7 06/03/2021   HGB 10.7 (L) 06/03/2021   HCT 32.3 (L) 06/03/2021   MCV 89 06/03/2021   PLT 211 06/03/2021      Component Value Date/Time   NA 139 06/03/2021 1014   K 4.5 06/03/2021 1014   CL 103 06/03/2021 1014  CO2 24 06/03/2021 1014   GLUCOSE 90 06/03/2021 1014   GLUCOSE 98 02/20/2018 1403   BUN 31 (H) 06/03/2021 1014   CREATININE 1.16 (H) 06/03/2021 1014   CALCIUM 9.5 06/03/2021 1014   PROT 6.4 06/03/2021 1014   ALBUMIN 4.3 06/03/2021 1014   AST 11 06/03/2021 1014   ALT 10 06/03/2021 1014   ALKPHOS 129 (H) 06/03/2021 1014   BILITOT <0.2 06/03/2021 1014   GFRNONAA 52 (L) 02/20/2018 1403   GFRAA >60 02/20/2018 1403       ASSESSMENT AND PLAN  Trigeminal neuralgia  Orthostatic hypotension  Idiopathic peripheral neuropathy  REM behavioral  disorder  Speech disturbance, unspecified type  Rheumatoid arthritis, involving unspecified site, unspecified whether rheumatoid factor present ([place])  Depression, unspecified depression type   1.    She has mild cognitive changes and mild changes in speech fluency.  Gait has worsened over the last 2 years- with a reduced stride but no significant retropulsion.  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  Dizzy spells likely due to orthostatic hypotension (improved on mestinon). ----  She may have Lewy body disease or other synucleinopathy (I.e. Shy Drager MSA varian).    We discussed increasing activity and eating well.  2.   Stay active and exercise as tolerated. 3.   Continue medications. 4.  Due to depression I will change the nighttime the imipramine (was on for trigeminal neuralgia that is doing much better) and start sertraline 50 mg.   5.   Rtc 6 months or sooner if new or worsening issues  43 minute office visit with the majority of the time spent face-to-face for history and physical, discussion/counseling and decision-making.  Additional time with record review and documentation.    A. Felecia Shelling, MD, [facility] 51/88/4166, 06:30 AM Certified in Neurology, Clinical Neurophysiology, Sleep Medicine and Neuroimaging  [facility] [address], [place], [place] [address] [phone]

## 2021-12-23 ENCOUNTER — Ambulatory Visit
Admission: RE | Admit: 2021-12-23 | Discharge: 2021-12-23 | Disposition: A | Discharge status: Home / Self Care | Payer: Medicare PPO | Source: Ambulatory Visit | Attending: Internal Medicine | Admitting: Internal Medicine

## 2021-12-23 DIAGNOSIS — Z1231 Encounter for screening mammogram for malignant neoplasm of breast: Secondary | ICD-10-CM

## 2022-04-19 DIAGNOSIS — K2100 Gastro-esophageal reflux disease with esophagitis, without bleeding: Secondary | ICD-10-CM | POA: Diagnosis present

## 2022-05-01 ENCOUNTER — Other Ambulatory Visit: Payer: Self-pay | Admitting: Psychiatry

## 2022-05-01 ENCOUNTER — Telehealth: Payer: Self-pay | Admitting: Neurology

## 2022-05-01 MED ORDER — CLONAZEPAM 1 MG PO TABS: 1.0000 mg | ORAL_TABLET | Freq: Every day | ORAL | 0 refills | Status: DC

## 2022-05-01 NOTE — Telephone Encounter (Signed)
Called and spoke with pt. Advised Dr. Billey Gosling called in 30 days supply to [facility] for her. She verbalized understanding and appreciation. ?

## 2022-05-01 NOTE — Telephone Encounter (Signed)
I sent in an rx for a 30 day supply of the clonazepam, thanks

## 2022-05-01 NOTE — Telephone Encounter (Signed)
Called pt back. They left for vacation 04/16/22 and came back yesterday. They are now unable to locate clonazepam bottle. 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 Last took med 04/29/22. This has never happened before. Wondering if we can call in new prescription for her given the circumstance. Aware Dr. Felecia Shelling out, I will have to forward request to Dr. Billey Gosling who is helping cover calls for him this afternoon to review. ? ?Checked drug registry. Last refilled 04/18/22 #30. Last seen 12/06/21 and next f/u 06/08/22. ?

## 2022-05-01 NOTE — Telephone Encounter (Signed)
[phone]  . Picked up refill on  04/20/22 before leaving for vacation. We cannot find the medication. Would like a call from the nurse to discuss getting a early refill. ?

## 2022-06-08 ENCOUNTER — Encounter: Payer: Self-pay | Admitting: Neurology

## 2022-06-08 ENCOUNTER — Ambulatory Visit: Payer: Medicare PPO | Admitting: Neurology

## 2022-06-08 VITALS — BP 128/71 | HR 83 | Ht 66.0 in | Wt 154.3 lb

## 2022-06-08 DIAGNOSIS — G3183 Dementia with Lewy bodies: Secondary | ICD-10-CM | POA: Diagnosis not present

## 2022-06-08 DIAGNOSIS — F32A Depression, unspecified: Secondary | ICD-10-CM

## 2022-06-08 DIAGNOSIS — F0280 Dementia in other diseases classified elsewhere without behavioral disturbance: Secondary | ICD-10-CM

## 2022-06-08 DIAGNOSIS — G500 Trigeminal neuralgia: Secondary | ICD-10-CM

## 2022-06-08 DIAGNOSIS — I951 Orthostatic hypotension: Secondary | ICD-10-CM | POA: Diagnosis not present

## 2022-06-08 DIAGNOSIS — G4752 REM sleep behavior disorder: Secondary | ICD-10-CM | POA: Diagnosis not present

## 2022-06-08 DIAGNOSIS — M48061 Spinal stenosis, lumbar region without neurogenic claudication: Secondary | ICD-10-CM

## 2022-06-08 NOTE — Progress Notes (Signed)
[facility]  PATIENT: Kristen Hansen DOB: 03/18/1946  REFERRING DOCTOR OR PCP: Cathi Roan, PA-C SOURCE: Patient, notes from primary care, previous neurology notes, imaging and laboratory report,  _________________________________   HISTORICAL  CHIEF COMPLAINT:  Chief Complaint  Patient presents with   Follow-up    Pt reports doing okay. States that she is going to PT for gait and back.  Room 2 with husband.     HISTORY OF PRESENT ILLNESS:  Kristen Hansen is a 76 year old woman with REM behavior disorder, progressive gait disturbance, progressive cognitive issues.  Update 06/08/2022 She is doing PT for her lower back issues.   Balance is better with PT.  No recent falls.    Pain is worse  if she is up and better when sitting a while.   Heat also helps.      She is seeing Dr. Minette Brine ([facility]) and had cervical spine fusion.   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       She has had more difficuly with cognition with reduced memory and processing.   She has had a delusion as if her mother is with her.    She does not have formed visual or auditory hallucinations..   She mistakes the phone and remote control.     Speech has a halting quality since 07/2020.  She sometimes has trouble coming up with the right words.   Sometimes she asks the same quesitons over again.  She gets frustrated easily as more trouble keeping up with activities.   She notes some  depression.    On clonazepam she has restful sleep and very little RBD with no kicking but some  mumbling.   Not getting out of bed.   She has excessive sleepiness.   n the past, she had a PSG and MSLT.  She had no OSA and hypersomnia. Provigil was not tolerated.      She has had urinary and fecal incontinence.   She has only rare spells of lightheadedness now since srtarting pyridostigmine.    MRI brain 01/24/21 showed scattered T2/FLAIR hyperintense foci in the subcortical  and deep white matter.  This is most consistent with mild age-related chronic microvascular ischemic change.   Mild atrophy.  The vestibulocochlear nerves appear normal.   She has trigeminal neuralgia, always on her left.   It is generally mild but occasioanl left sided episode.   When one occurs she takes one extra oxcarbazapine300 mg po t(bid or tid.   Also on lamotrigine.  She has RA and was on Enbrel but it was stopped before her 2021 cervical fusion.  Pain has not come back too badly but she notes more joint disfiguration in hands.  Transient Alteration of Awareness History: She  has had 3 episodes of reduced awareness between 76 and 2021.  In 2018, while at a craft fair she was walking and the next thing she remembers she was unaware on the floor. It was witnessed and a friend said she just dropped (not tripped) and she recalls people around her when she came to.   No tongue biting or incontinence.   She quickly was back to baseline.  Most recently, June 2021, she was with a group of women at a picnic table when her face turned white and she became unresponsive to questions.  She had just stood up at the time it started and others helped her back down.  There was no complete loss of consciousness and there was no generalized tonic-clonic activity.  She very quickly was talking normally.  Also, she had cervical spine surgery 08/13/2020.  While in physical therapy, standing working on balance, she had an episode where she was unresponsive to questions that lasted 1 to 2 minutes.  There was no generalized tonic-clonic activity.   She has no memory of the actual events but clearly remembers before and after the events.    EEG 10/13/2020:  Normal EEG while the patient was awake and drowsy showed mild generalized cortical slowing that could be due to mild underlying cortical dysfunction.  There were no seizures or other epileptiform activity  MRI Brain 01/24/2021 showed fairly mild atrophy and mild  SVID.    _______________________________________________  IMAGING: [facility] 02/06/2020 IMPRESSION: This MRI of the lumbar spine without contrast shows the following: 1.    Mild levoscoliosis. 2.    At L2-L3, there is mild spinal stenosis due to degenerative changes detailed above.  There did not appear to be any nerve root compression. 3.    At L3-L4, there is moderate spinal stenosis due to degenerative changes detailed above but there does not appear to be any nerve root compression. 4.    At L4-L5, there is anterolisthesis and other degenerative changes detailed above causing moderate spinal stenosis and moderately severe left lateral recess stenosis with possible left L5 nerve root compression. 5.    At L5-S1, there are degenerative changes as detailed above causing moderate spinal stenosis but there did not appear to be nerve root compression. 6.    There are milder degenerative changes at T11-T12 and L1-L2 that do not lead to spinal stenosis or nerve root compression.  Cervical Spine 02/06/2020 IMPRESSION: This MRI of the cervical spine without contrast shows the following: 1.   The spinal cord appears normal. 2.   At C3-C4, there is moderate spinal stenosis and mild to moderate bilateral foraminal narrowing but no nerve root compression. 3.   At C4-C5, there is moderate spinal stenosis, right greater than left, mostly due to a right paramedian disc extrusion.  There is moderately severe right foraminal narrowing with possible right C5 nerve root compression. 4.   At C5-C6, there are degenerative changes causing moderate spinal stenosis and moderately severe right foraminal narrowing with possible right C6 nerve root compression. 5.   C6-C7, there are degenerative changes causing moderate spinal stenosis and moderately severe left foraminal narrowing which could lead to left C7 nerve root compression.   REVIEW OF SYSTEMS: Constitutional: No fevers, chills, sweats, or change in  appetite Eyes: No visual changes, double vision, eye pain Ear, nose and throat: No hearing loss, ear pain, nasal congestion, sore throat Cardiovascular: No chest pain, palpitations Respiratory:  No shortness of breath at rest or with exertion.   No wheezes GastrointestinaI: No nausea, vomiting, diarrhea, abdominal pain, fecal incontinence Genitourinary:  No dysuria, urinary retention or frequency.  No nocturia. Musculoskeletal: As above Integumentary: No rash, pruritus, skin lesions Neurological: as above Psychiatric: No depression at this time.  No anxiety Endocrine: No palpitations, diaphoresis, change in appetite, change in weigh or increased thirst Hematologic/Lymphatic:  No anemia, purpura, petechiae. Allergic/Immunologic: No itchy/runny eyes, nasal congestion, recent allergic reactions, rashes  ALLERGIES: Allergies  Allergen Reactions   Morphine And Related Hives   Prevacid [Lansoprazole] Other (See Comments)    unknown   Provigil [Modafinil] Other (See Comments)    dizziness   Adhesive [Tape] Rash  bandaids     HOME MEDICATIONS:  Current Outpatient Medications:    acetaminophen-codeine (TYLENOL #3) 300-30 MG tablet, Take 1-2 tablets by mouth every 6 (six) hours as needed (for pain)., Disp: 30 tablet, Rfl: 0   amLODipine-olmesartan (AZOR) 5-40 MG tablet, Take 1 tablet by mouth every evening. , Disp: , Rfl:    clonazePAM (KLONOPIN) 1 MG tablet, Take 1 tablet (1 mg total) by mouth at bedtime., Disp: 30 tablet, Rfl: 0   Coenzyme Q10 (CO Q 10 PO), Take 1 Dose by mouth daily., Disp: , Rfl:    Docusate Calcium (STOOL SOFTENER PO), Take 1 tablet by mouth daily., Disp: , Rfl:    ergocalciferol (VITAMIN D2) 1.25 MG (50000 UT) capsule, ergocalciferol (vitamin D2) 1,250 mcg (50,000 unit) capsule  TAKE 1 CAPSULE BY MOUTH ONCE A WEEK FOR 12 WEEKS, Disp: , Rfl:    etanercept (ENBREL SURECLICK) 50 MG/ML injection, 1 injection, Disp: , Rfl:    famotidine (PEPCID) 40 MG tablet, Take 40 mg  by mouth daily as needed., Disp: , Rfl:    imipramine (TOFRANIL) 25 MG tablet, Take by mouth., Disp: , Rfl:    lamoTRIgine (LAMICTAL) 150 MG tablet, Take 1 tablet (150 mg total) by mouth daily. appointment needed for further refills., Disp: 180 tablet, Rfl: 3   loratadine (CLARITIN) 10 MG tablet, Take 10 mg by mouth daily., Disp: , Rfl:    mometasone (NASONEX) 50 MCG/ACT nasal spray, Place 1 spray into the nose daily as needed (CONGESTION). , Disp: , Rfl:    montelukast (SINGULAIR) 10 MG tablet, Take 10 mg by mouth at bedtime., Disp: , Rfl:    omeprazole (PRILOSEC) 20 MG capsule, Take 20 mg by mouth daily. , Disp: , Rfl:    ondansetron (ZOFRAN ODT) 8 MG disintegrating tablet, Take 1 tablet (8 mg total) by mouth every 8 (eight) hours as needed for nausea or vomiting., Disp: 10 tablet, Rfl: 0   Oxcarbazepine (TRILEPTAL) 300 MG tablet, TAKE 1 TABLET BY MOUTH IN THE MORNING AND 1 AT NOON AND 2 AT BEDTIME, Disp: 360 tablet, Rfl: 3   pravastatin (PRAVACHOL) 40 MG tablet, Take 40 mg by mouth daily before breakfast. , Disp: , Rfl:    pyridostigmine (MESTINON) 60 MG tablet, Take 2 to 3 a day, Disp: 270 tablet, Rfl: 3   sertraline (ZOLOFT) 50 MG tablet, Take 1 tablet (50 mg total) by mouth daily., Disp: 90 tablet, Rfl: 3   valACYclovir (VALTREX) 500 MG tablet, TAKE 2 TABLETS BY MOUTH EVERY 12 HOURS AS NEEDED FOR FEVER BLISTERS, Disp: , Rfl:   PAST MEDICAL HISTORY: Past Medical History:  Diagnosis Date   Back pain    Cancer (HCC)    Skin, basal cell   Dyspnea    Fatigue    GERD (gastroesophageal reflux disease)    Headache(784.0)    history of migraines   History of kidney stones    Hypercholesteremia    Hypertension    Leg swelling    Bilateral   Occasional tremors    mild hand tremors   Osteoarthritis    PONV (postoperative nausea and vomiting)    RBD (REM behavioral disorder)    Rheumatoid arthritis(714.0)    Scoliosis of lumbar spine    Spinal stenosis    Multiple levels   Tic  douloureux    Trigeminal neuralgia 03/30/2015   Vision abnormalities     PAST SURGICAL HISTORY: Past Surgical History:  Procedure Laterality Date   ABDOMINAL HYSTERECTOMY  AUGMENTATION MAMMAPLASTY Bilateral    COLONOSCOPY     JOINT REPLACEMENT     right uni knee, right hip with 2 revisions   KIDNEY STONE SURGERY Bilateral    KNEE ARTHROTOMY Right 02/27/2018   Procedure: RIGHT KNEE ARTHROTOMY; scar excision;  Surgeon: Gaynelle Arabian, MD;  Location: [facility];  Service: Orthopedics;  Laterality: Right;   MOHS SURGERY     TONSILLECTOMY     TOTAL HIP ARTHROPLASTY     TOTAL KNEE ARTHROPLASTY  01/22/2013   Procedure: TOTAL KNEE ARTHROPLASTY;  Surgeon: Gearlean Alf, MD;  Location: [facility];  Service: Orthopedics;  Laterality: Right;  Revision of a Right Uni Knee to a Total Knee Arthroplasty    FAMILY HISTORY: Family History  Problem Relation Age of Onset   Heart disease Mother    Parkinson's disease Father     SOCIAL HISTORY:  Social History   Socioeconomic History   Marital status: Married    Spouse name: Not on file   Number of children: Not on file   Years of education: Not on file   Highest education level: Not on file  Occupational History   Not on file  Tobacco Use   Smoking status: Never   Smokeless tobacco: Never  Vaping Use   Vaping Use: Never used  Substance and Sexual Activity   Alcohol use: Yes    Alcohol/week: 0.0 standard drinks of alcohol    Comment: occasional   Drug use: No   Sexual activity: Not on file  Other Topics Concern   Not on file  Social History Narrative   Lives   Caffeine use:    Social Determinants of Health   Financial Resource Strain: Not on file  Food Insecurity: Not on file  Transportation Needs: Not on file  Physical Activity: Not on file  Stress: Not on file  Social Connections: Not on file  Intimate Partner Violence: Not on file     PHYSICAL EXAM  Vitals:   06/08/22 1130  BP: 128/71  Pulse: 83  Weight: 154 lb 5 oz  (70 kg)  Height: '5\' 6"'$  (1.676 m)    Body mass index is 24.91 kg/m.    General: The patient is well-developed and well-nourished and in no acute distress.  She has a cervical collar  HEENT:  Head is [place]/AT.  Sclera are anicteric.    Skin: Extremities are without rash or edema.   Mild skin color changes    Neurologic Exam  Mental status: She appears bradykinetic. The patient is alert and oriented x 3 at the time of the examination. The patient has mildly reduced recent memory  with reduced attention span qualitatively   Speech has halting quality but sentence structure is fine.    Cranial nerves: Extraocular movements are full.   Facial strength and sensation are normal.   Trapezius and sternocleidomastoid strength is normal. No dysarthria is noted.    No obvious hearing deficits are noted, but Weber lateralizes to the left.  Motor: She has bradykinesia.  Muscle bulk is normal.   Tone is normal. Strength is  5 / 5 in all 4 extremities.   Sensory: Sensory testing is intact to pinprick, soft touch and vibration sensation in arms but reduced vibration and touch in feet (80-90% vib at ankles and 40% at toes)  Coordination: Cerebellar testing reveals good finger-nose-finger and heel-to-shin bilaterally.  Gait and station: Station is normal.   Her gait is similar to last visit.  She can turn 180  degrees and 4 or 5 steps.  She has only mild retropulsion.  Romberg is negative.   Reflexes: Deep tendon reflexes are symmetric and normal bilaterally.       DIAGNOSTIC DATA (LABS, IMAGING, TESTING) - I reviewed patient records, labs, notes, testing and imaging myself where available.  Lab Results  Component Value Date   WBC 4.7 06/03/2021   HGB 10.7 (L) 06/03/2021   HCT 32.3 (L) 06/03/2021   MCV 89 06/03/2021   PLT 211 06/03/2021      Component Value Date/Time   NA 139 06/03/2021 1014   K 4.5 06/03/2021 1014   CL 103 06/03/2021 1014   CO2 24 06/03/2021 1014   GLUCOSE 90 06/03/2021  1014   GLUCOSE 98 02/20/2018 1403   BUN 31 (H) 06/03/2021 1014   CREATININE 1.16 (H) 06/03/2021 1014   CALCIUM 9.5 06/03/2021 1014   PROT 6.4 06/03/2021 1014   ALBUMIN 4.3 06/03/2021 1014   AST 11 06/03/2021 1014   ALT 10 06/03/2021 1014   ALKPHOS 129 (H) 06/03/2021 1014   BILITOT <0.2 06/03/2021 1014   GFRNONAA 52 (L) 02/20/2018 1403   GFRAA >60 02/20/2018 1403       ASSESSMENT AND PLAN  Lewy body dementia without behavioral disturbance, psychotic disturbance, mood disturbance, or anxiety, unspecified dementia severity (HCC)  REM behavioral disorder  Orthostatic hypotension  Trigeminal neuralgia  Depression, unspecified depression type  Spinal stenosis of lumbar region, unspecified whether neurogenic claudication present   1.  She has had some progression of cognitive issues.  I believe she most likely has Lewy body disease though another synucleinopathy like multisystem atrophy is possible.  She is more bradykinetic today.  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  Dizzy spells likely due to orthostatic hypotension (improved on mestinon).  2.   Stay active and exercise as tolerated. 3.   Continue oxcarbazepine and lamotrigine for trigeminal neuralgia.. 4.  For mood, continue sertraline 50 mg.   5.   Rtc 6 months or sooner if new or worsening issues  40-minute office visit with the majority of the time spent face-to-face for history and physical, discussion/counseling and decision-making.  Additional time with record review and documentation.   A. Felecia Shelling, MD, [facility] 0/09/2724, 36:64 AM Certified in Neurology, Clinical Neurophysiology, Sleep Medicine and Neuroimaging  [facility] [address] [place], [place] [address] [phone]

## 2022-06-19 ENCOUNTER — Other Ambulatory Visit: Payer: Self-pay | Admitting: Neurology

## 2022-06-19 ENCOUNTER — Other Ambulatory Visit: Payer: Self-pay | Admitting: Psychiatry

## 2022-06-28 ENCOUNTER — Encounter (HOSPITAL_BASED_OUTPATIENT_CLINIC_OR_DEPARTMENT_OTHER): Payer: Self-pay | Admitting: Emergency Medicine

## 2022-06-28 ENCOUNTER — Other Ambulatory Visit: Payer: Self-pay

## 2022-06-28 DIAGNOSIS — S0990XA Unspecified injury of head, initial encounter: Secondary | ICD-10-CM | POA: Diagnosis present

## 2022-06-28 DIAGNOSIS — Z23 Encounter for immunization: Secondary | ICD-10-CM | POA: Diagnosis not present

## 2022-06-28 DIAGNOSIS — W01198A Fall on same level from slipping, tripping and stumbling with subsequent striking against other object, initial encounter: Secondary | ICD-10-CM | POA: Diagnosis not present

## 2022-06-28 DIAGNOSIS — S0101XA Laceration without foreign body of scalp, initial encounter: Secondary | ICD-10-CM | POA: Diagnosis not present

## 2022-06-28 NOTE — ED Triage Notes (Signed)
Pt reports falling backwards hitting head on concrete. Pt sustained laceration to back of head. Denies LOC and blood thinners.

## 2022-06-29 ENCOUNTER — Emergency Department (HOSPITAL_BASED_OUTPATIENT_CLINIC_OR_DEPARTMENT_OTHER)
Admission: EM | Admit: 2022-06-29 | Discharge: 2022-06-29 | Disposition: A | Discharge status: Home / Self Care | Payer: Medicare PPO | Attending: Emergency Medicine | Admitting: Emergency Medicine

## 2022-06-29 ENCOUNTER — Emergency Department (HOSPITAL_BASED_OUTPATIENT_CLINIC_OR_DEPARTMENT_OTHER): Payer: Medicare PPO

## 2022-06-29 DIAGNOSIS — S0990XA Unspecified injury of head, initial encounter: Secondary | ICD-10-CM

## 2022-06-29 DIAGNOSIS — S0101XA Laceration without foreign body of scalp, initial encounter: Secondary | ICD-10-CM

## 2022-06-29 MED ORDER — TETANUS-DIPHTH-ACELL PERTUSSIS 5-2.5-18.5 LF-MCG/0.5 IM SUSY
0.5000 mL | PREFILLED_SYRINGE | Freq: Once | INTRAMUSCULAR | Status: AC
  Administered 2022-06-29: 0.5 mL via INTRAMUSCULAR
  Filled 2022-06-29: qty 0.5

## 2022-06-29 NOTE — ED Provider Notes (Signed)
[facility] Provider Note   CSN: [id number] Arrival date & time: 06/28/22  1943     History  No chief complaint on file.   Quanetta Truss Rohlman is a 76 y.o. female.  76 year old female that was doing some gardening.  She states that she tripped and fell backwards knocking down some type of metal pole and laying on the ground hitting the back of her head.  No syncope or emesis since that time.  No blood thinners.  She states that she had quite bit of bleeding worried her so she came here for further evaluation.  Last tetanus shot was in 2013.  No numbness, weakness or tingling anywhere.  No injuries elsewhere.  But does have a little bit of neck tightness.         Home Medications Prior to Admission medications   Medication Sig Start Date End Date Taking? Authorizing Provider  acetaminophen-codeine (TYLENOL #3) 300-30 MG tablet Take 1-2 tablets by mouth every 6 (six) hours as needed (for pain). 06/25/21   Molpus, John, MD  amLODipine-olmesartan (AZOR) 5-40 MG tablet Take 1 tablet by mouth every evening.     [provider]  clonazePAM (KLONOPIN) 1 MG tablet TAKE 1 TABLET BY MOUTH AT BEDTIME 06/21/22   Sater, Nanine Means, MD  Coenzyme Q10 (CO Q 10 PO) Take 1 Dose by mouth daily.    [provider]  Docusate Calcium (STOOL SOFTENER PO) Take 1 tablet by mouth daily.    [provider]  ergocalciferol (VITAMIN D2) 1.25 MG (50000 UT) capsule ergocalciferol (vitamin D2) 1,250 mcg (50,000 unit) capsule  TAKE 1 CAPSULE BY MOUTH ONCE A WEEK FOR 12 WEEKS    [provider]  etanercept (ENBREL SURECLICK) 50 MG/ML injection 1 injection 06/08/21   [provider]  famotidine (PEPCID) 40 MG tablet Take 40 mg by mouth daily as needed. 01/25/22   [provider]  imipramine (TOFRANIL) 25 MG tablet Take by mouth.    [provider]  lamoTRIgine (LAMICTAL) 150 MG tablet Take 1 tablet (150 mg total) by mouth daily. 06/19/22    Sater, Nanine Means, MD  loratadine (CLARITIN) 10 MG tablet Take 10 mg by mouth daily.    [provider]  mometasone (NASONEX) 50 MCG/ACT nasal spray Place 1 spray into the nose daily as needed (CONGESTION).     [provider]  montelukast (SINGULAIR) 10 MG tablet Take 10 mg by mouth at bedtime.    [provider]  omeprazole (PRILOSEC) 20 MG capsule Take 20 mg by mouth daily.     [provider]  ondansetron (ZOFRAN ODT) 8 MG disintegrating tablet Take 1 tablet (8 mg total) by mouth every 8 (eight) hours as needed for nausea or vomiting. 06/25/21   Molpus, John, MD  Oxcarbazepine (TRILEPTAL) 300 MG tablet TAKE 1 TABLET BY MOUTH IN THE MORNING AND 1 TABLET AT NOON AND THEN 2 TABLETS AT BEDTIME 06/19/22   Sater, Nanine Means, MD  pravastatin (PRAVACHOL) 40 MG tablet Take 40 mg by mouth daily before breakfast.     [provider]  pyridostigmine (MESTINON) 60 MG tablet Take 2 to 3 a day 06/03/21   Sater, Nanine Means, MD  sertraline (ZOLOFT) 50 MG tablet Take 1 tablet (50 mg total) by mouth daily. 12/06/21   Sater, Nanine Means, MD  valACYclovir (VALTREX) 500 MG tablet TAKE 2 TABLETS BY MOUTH EVERY 12 HOURS AS NEEDED FOR FEVER BLISTERS    [provider]  Allergies    Morphine and related, Prevacid [lansoprazole], Provigil [modafinil], and Adhesive [tape]    Review of Systems   Review of Systems  Physical Exam Updated Vital Signs BP (!) 178/88 (BP Location: Right Arm)   Pulse 69   Temp 98.5 F (36.9 C) (Oral)   Resp 18   Ht '5\' 10"'$  (1.778 m)   Wt 68.9 kg   SpO2 99%   BMI 21.81 kg/m  Physical Exam Vitals and nursing note reviewed.  Constitutional:      Appearance: She is well-developed.  HENT:     Head: Normocephalic.     Comments: Approximately 0.5 cm abrasion/superficial laceration to posterior scalp with associated hematoma. Approximated. Hemostatic.     Mouth/Throat:     Mouth: Mucous membranes are moist.     Pharynx: Oropharynx is  clear.  Eyes:     Pupils: Pupils are equal, round, and reactive to light.  Cardiovascular:     Rate and Rhythm: Normal rate and regular rhythm.  Pulmonary:     Effort: No respiratory distress.     Breath sounds: No stridor.  Abdominal:     General: Abdomen is flat. There is no distension.  Musculoskeletal:        General: Tenderness (mild paracervical) present. No swelling. Normal range of motion.     Cervical back: Normal range of motion.  Skin:    General: Skin is warm and dry.  Neurological:     General: No focal deficit present.     Mental Status: She is alert.     ED Results / Procedures / Treatments   Labs (all labs ordered are listed, but only abnormal results are displayed) Labs Reviewed - No data to display  EKG None  Radiology CT Cervical Spine Wo Contrast  Result Date: 06/29/2022 CLINICAL DATA:  Status post trauma. EXAM: CT CERVICAL SPINE WITHOUT CONTRAST TECHNIQUE: Multidetector CT imaging of the cervical spine was performed without intravenous contrast. Multiplanar CT image reconstructions were also generated. RADIATION DOSE REDUCTION: This exam was performed according to the departmental dose-optimization program which includes automated exposure control, adjustment of the mA and/or kV according to patient size and/or use of iterative reconstruction technique. COMPARISON:  None Available. FINDINGS: Alignment: There is straightening of the normal cervical spine lordosis. Skull base and vertebrae: No acute fracture. A metallic density fusion plate and screws are seen along the anterior aspects of the C3, C4, C5, C6 and C7 vertebral bodies. No primary bone lesion or focal pathologic process. Soft tissues and spinal canal: No prevertebral fluid or swelling. No visible canal hematoma. Disc levels: There is evidence of prior anterior cervical fusion from the level of C3 through C7 with anterior and intervertebral metallic density hardware noted throughout this region. There is  moderate to marked severity narrowing of the anterior atlantoaxial articulation. Bilateral moderate severity multilevel facet joint hypertrophy is noted. Upper chest: Negative. Other: None. IMPRESSION: 1. Status post anterior cervical fusion from the level of C3 through C7. 2. Additional multilevel degenerative changes without acute cervical spine fracture or subluxation. Electronically Signed   By: Virgina Norfolk M.D.   On: 06/29/2022 02:17   CT Head Wo Contrast  Result Date: 06/29/2022 CLINICAL DATA:  Recent fall with headaches, initial encounter EXAM: CT HEAD WITHOUT CONTRAST TECHNIQUE: Contiguous axial images were obtained from the base of the skull through the vertex without intravenous contrast. RADIATION DOSE REDUCTION: This exam was performed according to the departmental dose-optimization program which includes automated exposure control, adjustment of  the mA and/or kV according to patient size and/or use of iterative reconstruction technique. COMPARISON:  11/09/2018 FINDINGS: Brain: No evidence of acute infarction, hemorrhage, hydrocephalus, extra-axial collection or mass lesion/mass effect. Mild atrophic changes are noted. Vascular: No hyperdense vessel or unexpected calcification. Skull: Normal. Negative for fracture or focal lesion. Sinuses/Orbits: No acute finding. Other: None. IMPRESSION: No acute intracranial abnormality noted. Electronically Signed   By: Inez Catalina M.D.   On: 06/29/2022 02:15    Procedures Procedures    Medications Ordered in ED Medications  Tdap (BOOSTRIX) injection 0.5 mL (0.5 mLs Intramuscular Given 06/29/22 0140)    ED Course/ Medical Decision Making/ A&P                           Medical Decision Making Amount and/or Complexity of Data Reviewed Radiology: ordered.  Risk Prescription drug management.   CT reviewed in persons over the myself without any evidence of intracranial traumatic injury.  Radiology read was r reviewed as well.  Tetanus updated.   Wound cleaned and not requiring stitches when he had put some Dermabond on it so that it would not ooze blood later.  No other signs or symptoms of intracranial injury or major concussive syndrome.   Final Clinical Impression(s) / ED Diagnoses Final diagnoses:  Injury of head, initial encounter  Laceration of scalp without foreign body, initial encounter    Rx / DC Orders ED Discharge Orders     None         , Corene Cornea, MD 06/29/22 0403

## 2022-07-25 ENCOUNTER — Other Ambulatory Visit: Payer: Self-pay | Admitting: Neurology

## 2022-07-25 NOTE — Telephone Encounter (Signed)
Last OV was on 06/08/22.  Next OV is scheduled for 12/28/22.  Last RX was written on 06/21/22 for 30 tabs.   [place] [facility] Database has been reviewed.

## 2022-07-29 IMAGING — MG DIGITAL SCREENING BREAST BILAT IMPLANT W/ TOMO W/ CAD
8 of 13 series · 8 of 29 positions shown · non-contrast
Comparison: Previous exam(s).

CLINICAL DATA: Screening.

EXAM:
DIGITAL SCREENING BILATERAL MAMMOGRAM WITH IMPLANTS, CAD AND TOMO
The patient has retroglandular implants. Standard and implant
displaced views were performed.

[R CC (1 of 2)]
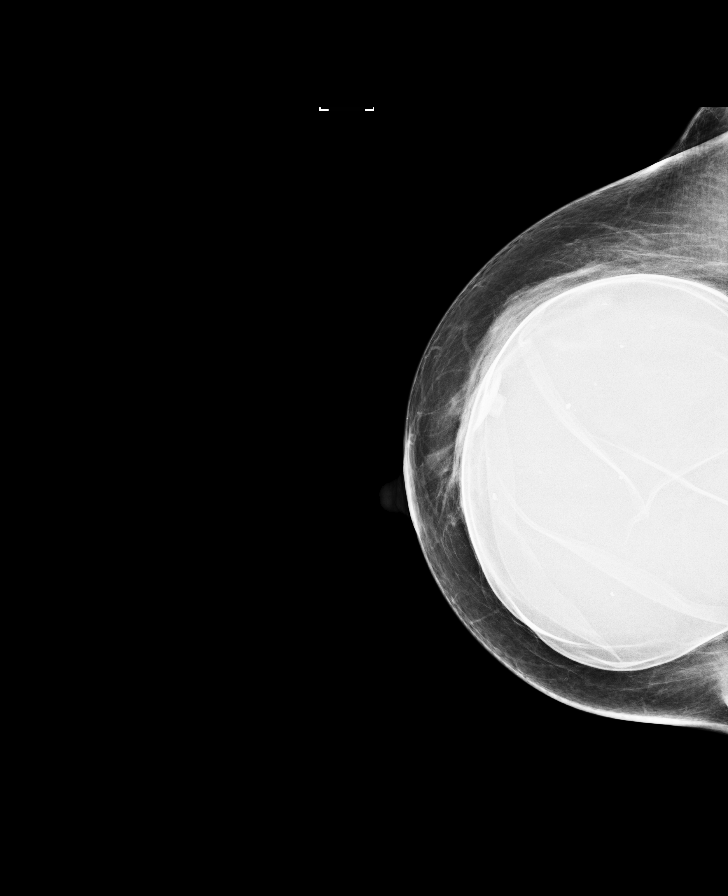

[L CC]
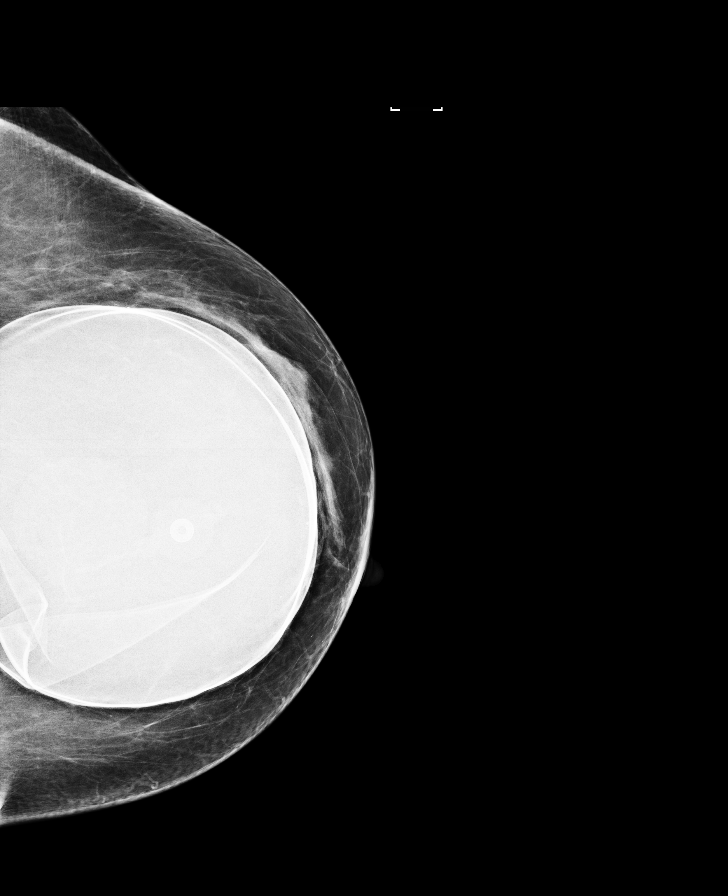

[R MLO]
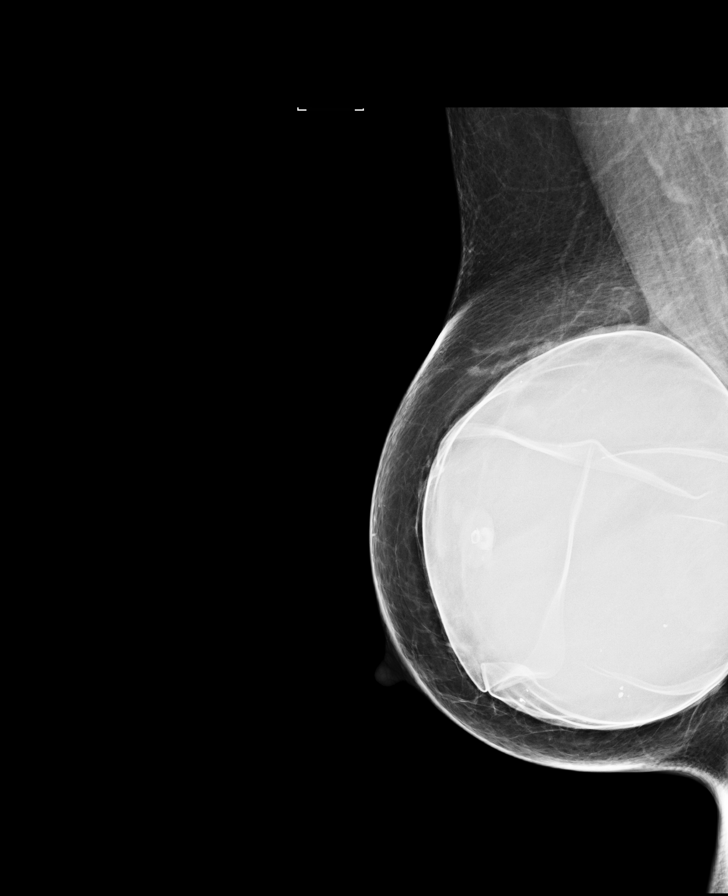

[L MLO]
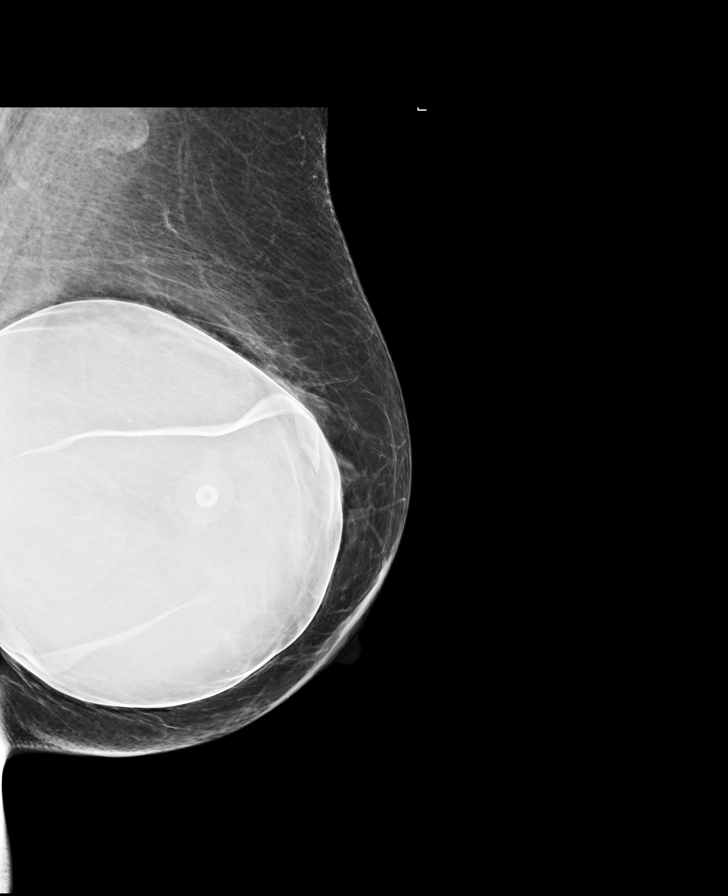

[R CC (2 of 2)]
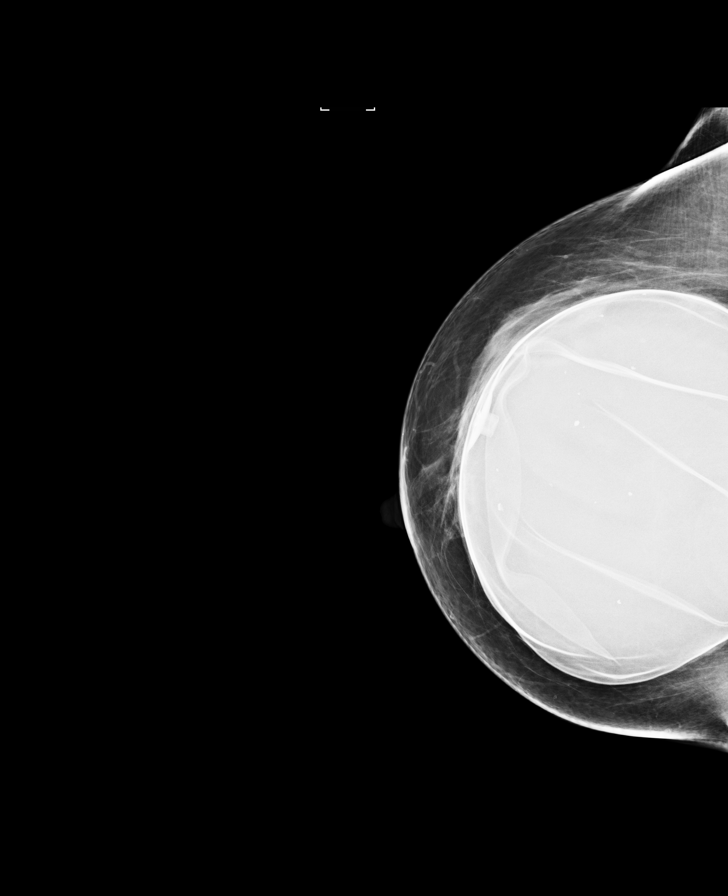

[L MLO synth-2D]
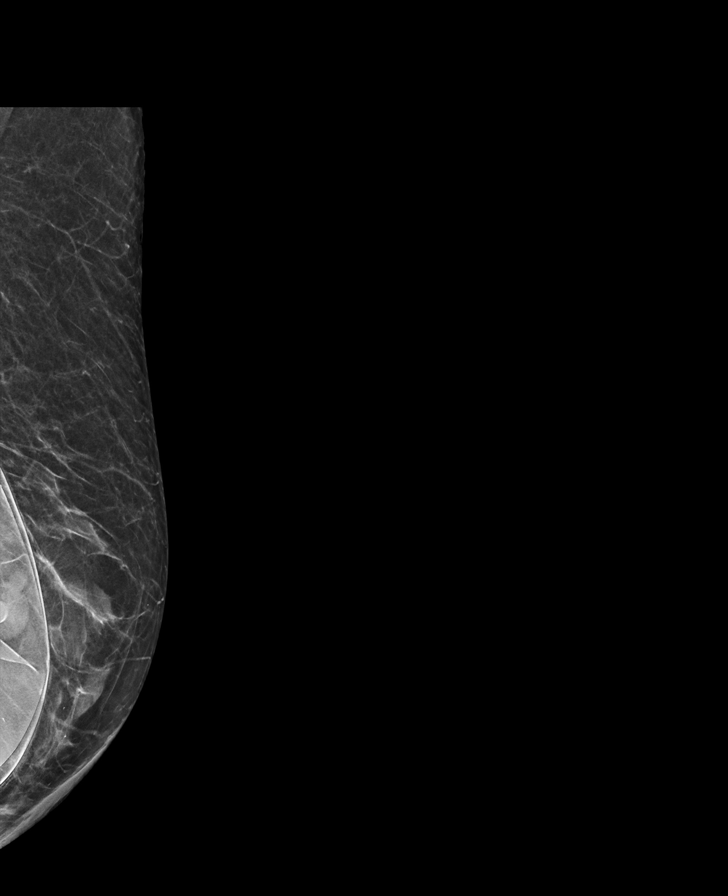

[R CC synth-2D]
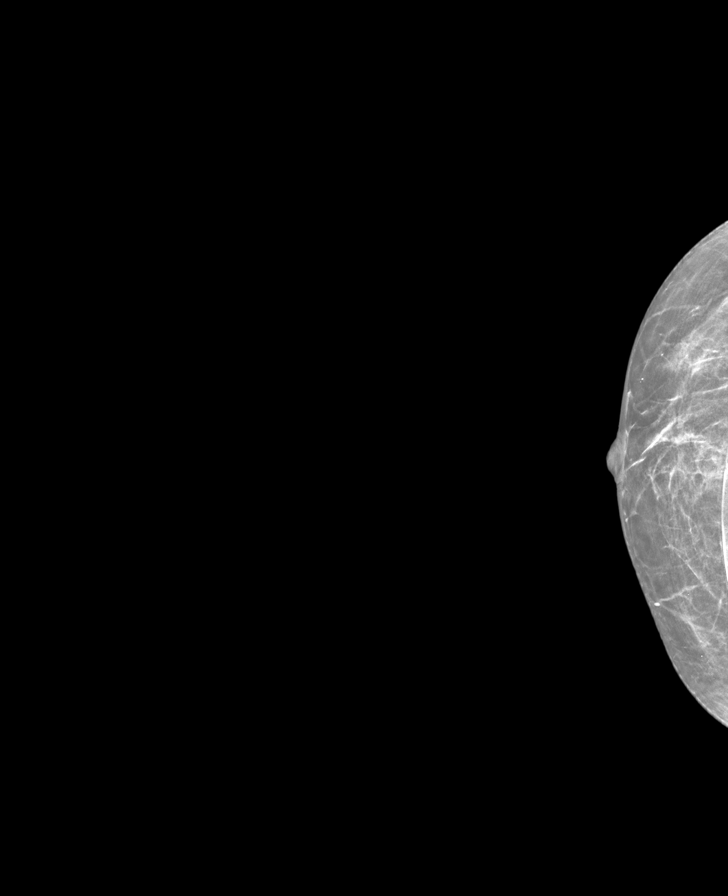

[L CC synth-2D]
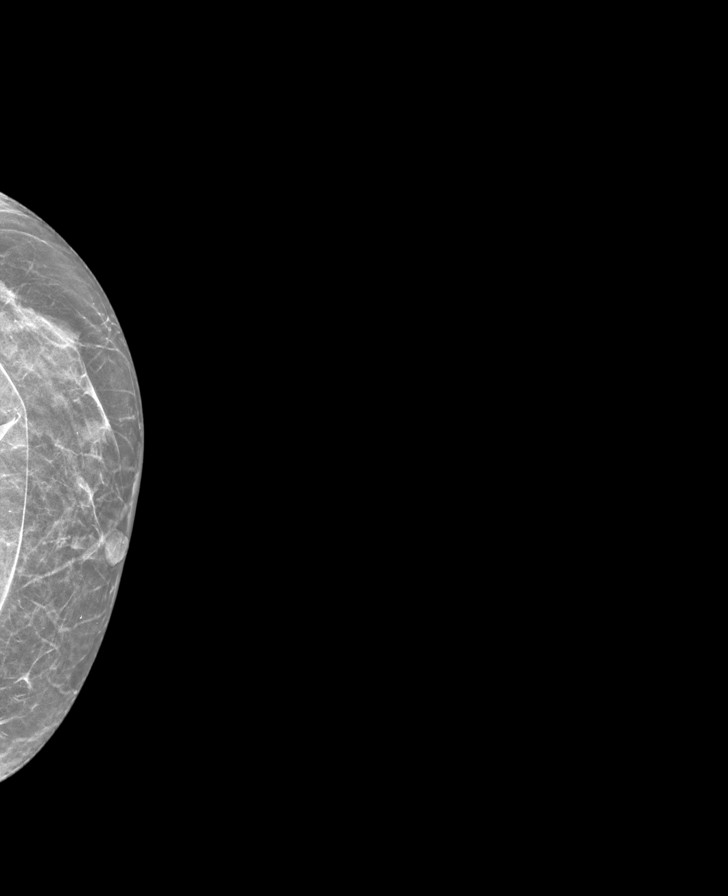

[8 of 29 positions shown; findings below may reference images not displayed]

ACR Breast Density Category b: There are scattered areas of
fibroglandular density.
FINDINGS: There are no findings suspicious for malignancy. Images were
processed with CAD.
IMPRESSION: No mammographic evidence of malignancy. A result letter of this
screening mammogram will be mailed directly to the patient.

RECOMMENDATION:
Screening mammogram in one year. (Code:VZ-[id number])

BI-RADS CATEGORY  1:  Negative.

## 2022-09-07 ENCOUNTER — Other Ambulatory Visit: Payer: Self-pay | Admitting: Neurology

## 2022-10-06 ENCOUNTER — Other Ambulatory Visit: Payer: Self-pay | Admitting: Neurology

## 2022-11-12 ENCOUNTER — Other Ambulatory Visit: Payer: Self-pay | Admitting: Neurology

## 2022-11-29 ENCOUNTER — Encounter: Payer: Self-pay | Admitting: Neurology

## 2022-11-29 ENCOUNTER — Ambulatory Visit: Payer: Medicare PPO | Admitting: Neurology

## 2022-11-29 VITALS — BP 164/70 | HR 101 | Ht 70.0 in | Wt 136.0 lb

## 2022-11-29 DIAGNOSIS — F32A Depression, unspecified: Secondary | ICD-10-CM

## 2022-11-29 DIAGNOSIS — G3183 Dementia with Lewy bodies: Secondary | ICD-10-CM | POA: Diagnosis not present

## 2022-11-29 DIAGNOSIS — F0280 Dementia in other diseases classified elsewhere without behavioral disturbance: Secondary | ICD-10-CM

## 2022-11-29 DIAGNOSIS — Z981 Arthrodesis status: Secondary | ICD-10-CM

## 2022-11-29 DIAGNOSIS — G500 Trigeminal neuralgia: Secondary | ICD-10-CM | POA: Diagnosis not present

## 2022-11-29 DIAGNOSIS — M069 Rheumatoid arthritis, unspecified: Secondary | ICD-10-CM

## 2022-11-29 DIAGNOSIS — I951 Orthostatic hypotension: Secondary | ICD-10-CM | POA: Diagnosis not present

## 2022-11-29 DIAGNOSIS — G4752 REM sleep behavior disorder: Secondary | ICD-10-CM | POA: Diagnosis not present

## 2022-11-29 NOTE — Patient Instructions (Addendum)
Stop the imipramine.  If facial pain continues to do well can stop the oxcarbazepine

## 2022-11-29 NOTE — Progress Notes (Signed)
[facility]  PATIENT: Kristen Hansen DOB: 1946-01-21  REFERRING DOCTOR OR PCP: Cathi Roan, PA-C SOURCE: Patient, notes from primary care, previous neurology notes, imaging and laboratory report,  _________________________________   HISTORICAL  CHIEF COMPLAINT:  Chief Complaint  Patient presents with   Follow-up    Pt reports doing okay. States that she is going to PT for gait and back.  Room 2 with husband.     HISTORY OF PRESENT ILLNESS:  Kristen Hansen is a 76 year old woman with REM behavior disorder, progressive gait disturbance, progressive cognitive issues.  Update 11/29/2022 She has had more difficuly with cognition with reduced memory and processing.  She is getting confused and no longer to operate the washing machine or remote.  She has trouble with taking her med's.    She sees her mother and mistakes things on the floor for something else.   She sometimes sees bugs when they are not there.    She does not have formed visual or auditory hallucinations.  Speech has a halting quality since 07/2020.  She sometimes has trouble coming up with the right words.   Sometimes she asks the same quesitons over again.  She gets frustrated easily as more trouble keeping up with activities.   She notes some  depression.    Gait has worsened this year and she has 2 falls.  CT 06/29/2022 was personally reviewed and is normal for age.    MRI brain 01/24/21 showed scattered T2/FLAIR hyperintense foci in the subcortical and deep white matter.  This is most consistent with mild age-related chronic microvascular ischemic change.   Mild atrophy.  The vestibulocochlear nerves appear normal.  On clonazepam she has restful sleep and very little RBD with no kicking but some  mumbling.   Not getting out of bed.   She has excessive sleepiness.   She snores.  In the past, she had a PSG and MSLT.  She had no OSA and hypersomnia. Provigil was not tolerated.      She has had urinary and  fecal incontinence.   She has only rare spells of lightheadedness now since srtarting pyridostigmine.    She has had left trigeminal neuralgia but this has generally done well over the last year.  She is on oxcarbazepine and lamotrigine.  She is doing PT for her lower back issues.   Balance is better with PT.  No recent falls.    Pain is worse  if she is up and better when sitting a while.   Heat also helps.      She is seeing Dr. Minette Brine ([facility]) and had cervical spine fusion.   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       She has RA and was on Enbrel but it was stopped before her 2021 cervical fusion.  Pain has not come back too badly but she notes more joint disfiguration in hands.  Transient Alteration of Awareness History: She  has had 3 episodes of reduced awareness between 2018 and 2021.  In 2018, while at a craft fair she was walking and the next thing she remembers she was unaware on the floor. It was witnessed and a friend said she just dropped (not tripped) and she recalls people around her when she came to.   No tongue biting or incontinence.   She quickly was back to baseline.  Most recently, June 2021, she was with a group of women  at a picnic table when her face turned white and she became unresponsive to questions.  She had just stood up at the time it started and others helped her back down.    There was no complete loss of consciousness and there was no generalized tonic-clonic activity.  She very quickly was talking normally.  Also, she had cervical spine surgery 08/13/2020.  While in physical therapy, standing working on balance, she had an episode where she was unresponsive to questions that lasted 1 to 2 minutes.  There was no generalized tonic-clonic activity.   She has no memory of the actual events but clearly remembers before and after the events.    EEG 10/13/2020:  Normal EEG while the patient was awake and drowsy showed mild  generalized cortical slowing that could be due to mild underlying cortical dysfunction.  There were no seizures or other epileptiform activity  MRI Brain 01/24/2021 showed fairly mild atrophy and mild SVID.    _______________________________________________  IMAGING: [facility] 02/06/2020 IMPRESSION: This MRI of the lumbar spine without contrast shows the following: 1.    Mild levoscoliosis. 2.    At L2-L3, there is mild spinal stenosis due to degenerative changes detailed above.  There did not appear to be any nerve root compression. 3.    At L3-L4, there is moderate spinal stenosis due to degenerative changes detailed above but there does not appear to be any nerve root compression. 4.    At L4-L5, there is anterolisthesis and other degenerative changes detailed above causing moderate spinal stenosis and moderately severe left lateral recess stenosis with possible left L5 nerve root compression. 5.    At L5-S1, there are degenerative changes as detailed above causing moderate spinal stenosis but there did not appear to be nerve root compression. 6.    There are milder degenerative changes at T11-T12 and L1-L2 that do not lead to spinal stenosis or nerve root compression.  Cervical Spine 02/06/2020 IMPRESSION: This MRI of the cervical spine without contrast shows the following: 1.   The spinal cord appears normal. 2.   At C3-C4, there is moderate spinal stenosis and mild to moderate bilateral foraminal narrowing but no nerve root compression. 3.   At C4-C5, there is moderate spinal stenosis, right greater than left, mostly due to a right paramedian disc extrusion.  There is moderately severe right foraminal narrowing with possible right C5 nerve root compression. 4.   At C5-C6, there are degenerative changes causing moderate spinal stenosis and moderately severe right foraminal narrowing with possible right C6 nerve root compression. 5.   C6-C7, there are degenerative changes causing moderate  spinal stenosis and moderately severe left foraminal narrowing which could lead to left C7 nerve root compression.   REVIEW OF SYSTEMS: Constitutional: No fevers, chills, sweats, or change in appetite Eyes: No visual changes, double vision, eye pain Ear, nose and throat: No hearing loss, ear pain, nasal congestion, sore throat Cardiovascular: No chest pain, palpitations Respiratory:  No shortness of breath at rest or with exertion.   No wheezes GastrointestinaI: No nausea, vomiting, diarrhea, abdominal pain, fecal incontinence Genitourinary:  No dysuria, urinary retention or frequency.  No nocturia. Musculoskeletal: As above Integumentary: No rash, pruritus, skin lesions Neurological: as above Psychiatric: No depression at this time.  No anxiety Endocrine: No palpitations, diaphoresis, change in appetite, change in weigh or increased thirst Hematologic/Lymphatic:  No anemia, purpura, petechiae. Allergic/Immunologic: No itchy/runny eyes, nasal congestion, recent allergic reactions, rashes  ALLERGIES: Allergies  Allergen Reactions  Morphine And Related Hives   Prevacid [Lansoprazole] Other (See Comments)    unknown   Provigil [Modafinil] Other (See Comments)    dizziness   Adhesive [Tape] Rash    bandaids     HOME MEDICATIONS:  Current Outpatient Medications:    acetaminophen-codeine (TYLENOL #3) 300-30 MG tablet, Take 1-2 tablets by mouth every 6 (six) hours as needed (for pain)., Disp: 30 tablet, Rfl: 0   amLODipine-olmesartan (AZOR) 5-40 MG tablet, Take 1 tablet by mouth every evening. , Disp: , Rfl:    clonazePAM (KLONOPIN) 1 MG tablet, Take 1 tablet (1 mg total) by mouth at bedtime., Disp: 30 tablet, Rfl: 0   Coenzyme Q10 (CO Q 10 PO), Take 1 Dose by mouth daily., Disp: , Rfl:    Docusate Calcium (STOOL SOFTENER PO), Take 1 tablet by mouth daily., Disp: , Rfl:    ergocalciferol (VITAMIN D2) 1.25 MG (50000 UT) capsule, ergocalciferol (vitamin D2) 1,250 mcg (50,000 unit)  capsule  TAKE 1 CAPSULE BY MOUTH ONCE A WEEK FOR 12 WEEKS, Disp: , Rfl:    etanercept (ENBREL SURECLICK) 50 MG/ML injection, 1 injection, Disp: , Rfl:    famotidine (PEPCID) 40 MG tablet, Take 40 mg by mouth daily as needed., Disp: , Rfl:    imipramine (TOFRANIL) 25 MG tablet, Take by mouth., Disp: , Rfl:    lamoTRIgine (LAMICTAL) 150 MG tablet, Take 1 tablet (150 mg total) by mouth daily. appointment needed for further refills., Disp: 180 tablet, Rfl: 3   loratadine (CLARITIN) 10 MG tablet, Take 10 mg by mouth daily., Disp: , Rfl:    mometasone (NASONEX) 50 MCG/ACT nasal spray, Place 1 spray into the nose daily as needed (CONGESTION). , Disp: , Rfl:    montelukast (SINGULAIR) 10 MG tablet, Take 10 mg by mouth at bedtime., Disp: , Rfl:    omeprazole (PRILOSEC) 20 MG capsule, Take 20 mg by mouth daily. , Disp: , Rfl:    ondansetron (ZOFRAN ODT) 8 MG disintegrating tablet, Take 1 tablet (8 mg total) by mouth every 8 (eight) hours as needed for nausea or vomiting., Disp: 10 tablet, Rfl: 0   Oxcarbazepine (TRILEPTAL) 300 MG tablet, TAKE 1 TABLET BY MOUTH IN THE MORNING AND 1 AT NOON AND 2 AT BEDTIME, Disp: 360 tablet, Rfl: 3   pravastatin (PRAVACHOL) 40 MG tablet, Take 40 mg by mouth daily before breakfast. , Disp: , Rfl:    pyridostigmine (MESTINON) 60 MG tablet, Take 2 to 3 a day, Disp: 270 tablet, Rfl: 3   sertraline (ZOLOFT) 50 MG tablet, Take 1 tablet (50 mg total) by mouth daily., Disp: 90 tablet, Rfl: 3   valACYclovir (VALTREX) 500 MG tablet, TAKE 2 TABLETS BY MOUTH EVERY 12 HOURS AS NEEDED FOR FEVER BLISTERS, Disp: , Rfl:   PAST MEDICAL HISTORY: Past Medical History:  Diagnosis Date   Back pain    Cancer (HCC)    Skin, basal cell   Dyspnea    Fatigue    GERD (gastroesophageal reflux disease)    Headache(784.0)    history of migraines   History of kidney stones    Hypercholesteremia    Hypertension    Leg swelling    Bilateral   Occasional tremors    mild hand tremors    Osteoarthritis    PONV (postoperative nausea and vomiting)    RBD (REM behavioral disorder)    Rheumatoid arthritis(714.0)    Scoliosis of lumbar spine    Spinal stenosis    Multiple levels  Tic [place]    Trigeminal neuralgia 03/30/2015   Vision abnormalities     PAST SURGICAL HISTORY: Past Surgical History:  Procedure Laterality Date   ABDOMINAL HYSTERECTOMY     AUGMENTATION MAMMAPLASTY Bilateral    COLONOSCOPY     JOINT REPLACEMENT     right uni knee, right hip with 2 revisions   KIDNEY STONE SURGERY Bilateral    KNEE ARTHROTOMY Right 02/27/2018   Procedure: RIGHT KNEE ARTHROTOMY; scar excision;  Surgeon: Gaynelle Arabian, MD;  Location: [facility];  Service: Orthopedics;  Laterality: Right;   MOHS SURGERY     TONSILLECTOMY     TOTAL HIP ARTHROPLASTY     TOTAL KNEE ARTHROPLASTY  01/22/2013   Procedure: TOTAL KNEE ARTHROPLASTY;  Surgeon: Gearlean Alf, MD;  Location: [facility];  Service: Orthopedics;  Laterality: Right;  Revision of a Right Uni Knee to a Total Knee Arthroplasty    FAMILY HISTORY: Family History  Problem Relation Age of Onset   Heart disease Mother    Parkinson's disease Father     SOCIAL HISTORY:  Social History   Socioeconomic History   Marital status: Married    Spouse name: Not on file   Number of children: Not on file   Years of education: Not on file   Highest education level: Not on file  Occupational History   Not on file  Tobacco Use   Smoking status: Never   Smokeless tobacco: Never  Vaping Use   Vaping Use: Never used  Substance and Sexual Activity   Alcohol use: Yes    Alcohol/week: 0.0 standard drinks of alcohol    Comment: occasional   Drug use: No   Sexual activity: Not on file  Other Topics Concern   Not on file  Social History Narrative   Lives   Caffeine use:    Social Determinants of Health   Financial Resource Strain: Not on file  Food Insecurity: Not on file  Transportation Needs: Not on file  Physical Activity: Not  on file  Stress: Not on file  Social Connections: Not on file  Intimate Partner Violence: Not on file     PHYSICAL EXAM  Vitals:   06/08/22 1130  BP: 128/71  Pulse: 83  Weight: 154 lb 5 oz (70 kg)  Height: '5\' 6"'$  (1.676 m)    Body mass index is 24.91 kg/m.    General: The patient is well-developed and well-nourished and in no acute distress.  She has a cervical collar  HEENT:  Head is [place]/AT.  Sclera are anicteric.    Skin: Extremities are without rash or edema.   Mild skin color changes    Neurologic Exam  Mental status: She appears bradykinetic. The patient is alert and oriented x 3 at the time of the examination. The patient has mildly reduced recent memory  with reduced attention span qualitatively   Speech has halting quality but sentence structure is fine.    Cranial nerves: Extraocular movements are full.   Facial strength and sensation are normal.   Trapezius and sternocleidomastoid strength is normal. No dysarthria is noted.    No obvious hearing deficits are noted, but Weber lateralizes to the left.  Motor: She has bradykinesia.  Muscle bulk is normal.   Tone is normal. Strength is  5 / 5 in all 4 extremities.   Sensory: Sensory testing is intact to pinprick, soft touch and vibration sensation in arms but reduced vibration and touch in feet (80-90% vib at ankles and  40% at toes)  Coordination: Cerebellar testing reveals good finger-nose-finger and heel-to-shin bilaterally.  Gait and station: Station is normal.   Her gait is similar to last visit.  She can turn 180 degrees and 4 or 5 steps.  She has only mild retropulsion.  Romberg is negative.   Reflexes: Deep tendon reflexes are symmetric and normal bilaterally.       DIAGNOSTIC DATA (LABS, IMAGING, TESTING) - I reviewed patient records, labs, notes, testing and imaging myself where available.  Lab Results  Component Value Date   WBC 4.7 06/03/2021   HGB 10.7 (L) 06/03/2021   HCT 32.3 (L) 06/03/2021    MCV 89 06/03/2021   PLT 211 06/03/2021      Component Value Date/Time   NA 139 06/03/2021 1014   K 4.5 06/03/2021 1014   CL 103 06/03/2021 1014   CO2 24 06/03/2021 1014   GLUCOSE 90 06/03/2021 1014   GLUCOSE 98 02/20/2018 1403   BUN 31 (H) 06/03/2021 1014   CREATININE 1.16 (H) 06/03/2021 1014   CALCIUM 9.5 06/03/2021 1014   PROT 6.4 06/03/2021 1014   ALBUMIN 4.3 06/03/2021 1014   AST 11 06/03/2021 1014   ALT 10 06/03/2021 1014   ALKPHOS 129 (H) 06/03/2021 1014   BILITOT <0.2 06/03/2021 1014   GFRNONAA 52 (L) 02/20/2018 1403   GFRAA >60 02/20/2018 1403       ASSESSMENT AND PLAN  Lewy body dementia without behavioral disturbance, psychotic disturbance, mood disturbance, or anxiety, unspecified dementia severity (HCC)  REM behavioral disorder  Orthostatic hypotension  Trigeminal neuralgia  Depression, unspecified depression type  Spinal stenosis of lumbar region, unspecified whether neurogenic claudication present   1.  Compared to 6 months ago she is fairly stable although over the last year has had progression of cognitive and gait issues.  I believe she most likely has Lewy body disease or, less likely, multisystem atrophy  Her REM behavior disorder improved on clonazepam.  Dizzy spells likely due to orthostatic hypotension (improved on mestinon).  2.   Stay active and exercise as tolerated. 3.  Due to the cognitive issues over the last year we will try to simplify her medical regimen.  Stop imipramine.    If facial pain continues to do well stop oxcarbazepine.  FOr now continue lamotrigine for trigeminal neuralgia.. 4.  For mood, continue sertraline 50 mg.   5.   Rtc 6 months or sooner if new or worsening issues  40-minute office visit with the majority of the time spent face-to-face for history and physical, discussion/counseling and decision-making.  Additional time with record review and documentation.     A. Felecia Shelling, MD, [facility] 9/38/1829, 93:71  AM Certified in Neurology, Clinical Neurophysiology, Sleep Medicine and Neuroimaging  [facility] [address] [place], [place] [address] [phone]

## 2022-12-06 ENCOUNTER — Other Ambulatory Visit: Payer: Self-pay | Admitting: Internal Medicine

## 2022-12-06 DIAGNOSIS — Z1231 Encounter for screening mammogram for malignant neoplasm of breast: Secondary | ICD-10-CM

## 2022-12-28 ENCOUNTER — Ambulatory Visit: Payer: Medicare PPO | Admitting: Neurology

## 2022-12-28 ENCOUNTER — Other Ambulatory Visit: Payer: Self-pay | Admitting: Neurology

## 2023-01-22 ENCOUNTER — Other Ambulatory Visit: Payer: Self-pay | Admitting: Neurology

## 2023-01-31 ENCOUNTER — Ambulatory Visit
Admission: RE | Admit: 2023-01-31 | Discharge: 2023-01-31 | Disposition: A | Discharge status: Home / Self Care | Payer: Medicare PPO | Source: Ambulatory Visit | Attending: Internal Medicine | Admitting: Internal Medicine

## 2023-01-31 DIAGNOSIS — Z1231 Encounter for screening mammogram for malignant neoplasm of breast: Secondary | ICD-10-CM

## 2023-03-15 ENCOUNTER — Other Ambulatory Visit: Payer: Self-pay | Admitting: Neurology

## 2023-03-19 ENCOUNTER — Other Ambulatory Visit: Payer: Self-pay | Admitting: Neurology

## 2023-03-20 NOTE — Telephone Encounter (Signed)
Last seen on 11/30/23 Follow up scheduled don 05/31/23 Rx last filled on 02/24/23 # 30 tablets  Rx pending to be signed

## 2023-05-31 ENCOUNTER — Encounter: Payer: Self-pay | Admitting: Neurology

## 2023-05-31 ENCOUNTER — Ambulatory Visit: Payer: Medicare PPO | Admitting: Neurology

## 2023-05-31 VITALS — BP 160/66 | HR 76

## 2023-05-31 DIAGNOSIS — F32A Depression, unspecified: Secondary | ICD-10-CM

## 2023-05-31 DIAGNOSIS — G4752 REM sleep behavior disorder: Secondary | ICD-10-CM | POA: Diagnosis not present

## 2023-05-31 DIAGNOSIS — G3183 Dementia with Lewy bodies: Secondary | ICD-10-CM

## 2023-05-31 DIAGNOSIS — G500 Trigeminal neuralgia: Secondary | ICD-10-CM

## 2023-05-31 DIAGNOSIS — M069 Rheumatoid arthritis, unspecified: Secondary | ICD-10-CM

## 2023-05-31 DIAGNOSIS — F0280 Dementia in other diseases classified elsewhere without behavioral disturbance: Secondary | ICD-10-CM

## 2023-05-31 DIAGNOSIS — I951 Orthostatic hypotension: Secondary | ICD-10-CM | POA: Diagnosis not present

## 2023-05-31 DIAGNOSIS — E0590 Thyrotoxicosis, unspecified without thyrotoxic crisis or storm: Secondary | ICD-10-CM

## 2023-05-31 NOTE — Progress Notes (Signed)
[facility]  PATIENT: Kristen Hansen DOB: [birth year]  REFERRING DOCTOR OR PCP: Michelle Nasuti, PA-C SOURCE: Patient, notes from primary care, previous neurology notes, imaging and laboratory report,  _________________________________   HISTORICAL  CHIEF COMPLAINT:  Chief Complaint  Patient presents with   Room 11    Pt is here with her Husband. Pt's husband states that as pt was coming into her appointment today pt had gotten weak and had to be put into a wheelchair. Checked pt's blood pressure and it was elevated 174/84 P 76. Gave pt some water and crackers for her stomach and Dizziness. Pt's ankles have some swelling.  Pt's husband states that pt's Dementia has gotten worse and he has to dress her, help her go to the bathroom, and her memory hasn't gotten better.  Pt's husband states that pt is having urinary iss    HISTORY OF PRESENT ILLNESS:  Kristen Hansen is a 77 year old woman with REM behavior disorder, progressive gait disturbance, progressive cognitive issues.  Update  05/31/2023 She has had progressoin of her symptoms.     Gait has worsened . She uses walls and furniture if needed in the house and uses a walker mostly outside.   She sometimes needs help to get dressed and bathroom.  With a walker on a good day she can go > 400 feet.   She had a 2 week period of time where she was doing worse  CT 06/29/2022 was personally reviewed and is normal for age.    MRI brain 01/24/21 showed scattered T2/FLAIR hyperintense foci in the subcortical and deep white matter.  This is most consistent with mild age-related chronic microvascular ischemic change.   Mild atrophy.  The vestibulocochlear nerves appear normal.  She has had more difficuly with cognition with reduced memory and processing.  She needs reminders for her pills.   She relies on husband.    She is getting confused and no longer to operate the washing machine or remote.  She often mistakes shadows or spots as  people or bugs   She does not have formed visual or auditory hallucinations.  Speech has a halting quality since 07/2020.  She sometimes has trouble coming up with the right words   She gets frustrated easily as more trouble keeping up with activities.   She notes some  depression.    She has had left trigeminal neuralgia but this has generally done well over the last year.  She is on oxcarbazepine (up o 900 mg po bid) and lamotrigine 150 mg bid  She takes clonazepam and mostly has restful sleep and very little RBD with no kicking however she sometimes mumbles or has a small jerk.   Not getting out of bed or kicking.   She has excessive sleepiness. And naps everyday   She snores.  In the past, she had a PSG and MSLT.  She had no OSA but had hypersomnia. Provigil was not tolerated.      She has had urinary and fecal incontinence.     She was having a lot of spells of lightheadedness but much better since srtarting pyridostigmine.    She has RA and was on Enbrel but it was stopped before her 2021 cervical fusion.  Pain has not come back too badly but she notes more joint disfiguration in hands.  She has lost weight (114 at home earlier in week).  Appetite is poor)  Labs 05/22/2023 show hyperthyroidism  TSH <0.05 and T3 and  T4 elevated (similar labs 8/23 and 01/2023)   She has an appt 06/07/2023 (Dr. Allena Katz)  Transient Alteration of Awareness History: She  has had 3 episodes of reduced awareness between 2018 and 2021.  In 2018, while at a craft fair she was walking and the next thing she remembers she was unaware on the floor. It was witnessed and a friend said she just dropped (not tripped) and she recalls people around her when she came to.   No tongue biting or incontinence.   She quickly was back to baseline.  Most recently, June 2021, she was with a group of women at a picnic table when her face turned white and she became unresponsive to questions.  She had just stood up at the time it started and  others helped her back down.    There was no complete loss of consciousness and there was no generalized tonic-clonic activity.  She very quickly was talking normally.  Also, she had cervical spine surgery 08/13/2020.  While in physical therapy, standing working on balance, she had an episode where she was unresponsive to questions that lasted 1 to 2 minutes.  There was no generalized tonic-clonic activity.   She has no memory of the actual events but clearly remembers before and after the events.    EEG 10/13/2020:  Normal EEG while the patient was awake and drowsy showed mild generalized cortical slowing that could be due to mild underlying cortical dysfunction.  There were no seizures or other epileptiform activity  MRI Brain 01/24/2021 showed fairly mild atrophy and mild SVID.    _______________________________________________  IMAGING: [facility] 02/06/2020 IMPRESSION: This MRI of the lumbar spine without contrast shows the following: 1.    Mild levoscoliosis. 2.    At L2-L3, there is mild spinal stenosis due to degenerative changes detailed above.  There did not appear to be any nerve root compression. 3.    At L3-L4, there is moderate spinal stenosis due to degenerative changes detailed above but there does not appear to be any nerve root compression. 4.    At L4-L5, there is anterolisthesis and other degenerative changes detailed above causing moderate spinal stenosis and moderately severe left lateral recess stenosis with possible left L5 nerve root compression. 5.    At L5-S1, there are degenerative changes as detailed above causing moderate spinal stenosis but there did not appear to be nerve root compression. 6.    There are milder degenerative changes at T11-T12 and L1-L2 that do not lead to spinal stenosis or nerve root compression.  Cervical Spine 02/06/2020 IMPRESSION: This MRI of the cervical spine without contrast shows the following: 1.   The spinal cord appears normal. 2.   At  C3-C4, there is moderate spinal stenosis and mild to moderate bilateral foraminal narrowing but no nerve root compression. 3.   At C4-C5, there is moderate spinal stenosis, right greater than left, mostly due to a right paramedian disc extrusion.  There is moderately severe right foraminal narrowing with possible right C5 nerve root compression. 4.   At C5-C6, there are degenerative changes causing moderate spinal stenosis and moderately severe right foraminal narrowing with possible right C6 nerve root compression. 5.   C6-C7, there are degenerative changes causing moderate spinal stenosis and moderately severe left foraminal narrowing which could lead to left C7 nerve root compression.   REVIEW OF SYSTEMS: Constitutional: No fevers, chills, sweats, or change in appetite Eyes: No visual changes, double vision, eye pain Ear, nose and  throat: No hearing loss, ear pain, nasal congestion, sore throat Cardiovascular: No chest pain, palpitations Respiratory:  No shortness of breath at rest or with exertion.   No wheezes GastrointestinaI: No nausea, vomiting, diarrhea, abdominal pain, fecal incontinence Genitourinary:  No dysuria, urinary retention or frequency.  No nocturia. Musculoskeletal: As above Integumentary: No rash, pruritus, skin lesions Neurological: as above Psychiatric: No depression at this time.  No anxiety Endocrine: No palpitations, diaphoresis, change in appetite, change in weigh or increased thirst Hematologic/Lymphatic:  No anemia, purpura, petechiae. Allergic/Immunologic: No itchy/runny eyes, nasal congestion, recent allergic reactions, rashes  ALLERGIES: Allergies  Allergen Reactions   Morphine And Codeine Hives   Prevacid [Lansoprazole] Other (See Comments)    unknown   Provigil [Modafinil] Other (See Comments)    dizziness   Adhesive [Tape] Rash    bandaids     HOME MEDICATIONS:  Current Outpatient Medications:    acetaminophen-codeine (TYLENOL #3) 300-30 MG  tablet, Take 1-2 tablets by mouth every 6 (six) hours as needed (for pain)., Disp: 30 tablet, Rfl: 0   amLODipine-olmesartan (AZOR) 5-40 MG tablet, Take 1 tablet by mouth every evening. , Disp: , Rfl:    clonazePAM (KLONOPIN) 1 MG tablet, TAKE 1 TABLET BY MOUTH AT BEDTIME, Disp: 30 tablet, Rfl: 5   Coenzyme Q10 (CO Q 10 PO), Take 1 Dose by mouth daily., Disp: , Rfl:    Docusate Calcium (STOOL SOFTENER PO), Take 1 tablet by mouth daily., Disp: , Rfl:    etanercept (ENBREL SURECLICK) 50 MG/ML injection, 1 injection, Disp: , Rfl:    famotidine (PEPCID) 40 MG tablet, Take 40 mg by mouth daily as needed., Disp: , Rfl:    lamoTRIgine (LAMICTAL) 150 MG tablet, Take 1 tablet by mouth once daily, Disp: 90 tablet, Rfl: 2   loratadine (CLARITIN) 10 MG tablet, Take 10 mg by mouth daily., Disp: , Rfl:    mometasone (NASONEX) 50 MCG/ACT nasal spray, Place 1 spray into the nose daily as needed (CONGESTION). , Disp: , Rfl:    montelukast (SINGULAIR) 10 MG tablet, Take 10 mg by mouth at bedtime., Disp: , Rfl:    omeprazole (PRILOSEC) 20 MG capsule, Take 20 mg by mouth daily. , Disp: , Rfl:    ondansetron (ZOFRAN ODT) 8 MG disintegrating tablet, Take 1 tablet (8 mg total) by mouth every 8 (eight) hours as needed for nausea or vomiting., Disp: 10 tablet, Rfl: 0   Oxcarbazepine (TRILEPTAL) 300 MG tablet, TAKE 1 TABLET BY MOUTH IN THE MORNING AND 1 AT NOON AND 2 AT BEDTIME, Disp: 360 tablet, Rfl: 3   pravastatin (PRAVACHOL) 40 MG tablet, Take 40 mg by mouth daily before breakfast. , Disp: , Rfl:    pyridostigmine (MESTINON) 60 MG tablet, TAKE 2 TO 3 TABS A DAY, Disp: 270 tablet, Rfl: 2   sertraline (ZOLOFT) 50 MG tablet, Take 1 tablet by mouth once daily, Disp: 90 tablet, Rfl: 2   valACYclovir (VALTREX) 500 MG tablet, TAKE 2 TABLETS BY MOUTH EVERY 12 HOURS AS NEEDED FOR FEVER BLISTERS, Disp: , Rfl:    ergocalciferol (VITAMIN D2) 1.25 MG (50000 UT) capsule, ergocalciferol (vitamin D2) 1,250 mcg (50,000 unit) capsule   TAKE 1 CAPSULE BY MOUTH ONCE A WEEK FOR 12 WEEKS (Patient not taking: Reported on 05/31/2023), Disp: , Rfl:   PAST MEDICAL HISTORY: Past Medical History:  Diagnosis Date   Back pain    Cancer (HCC)    Skin, basal cell   Dyspnea    Fatigue  GERD (gastroesophageal reflux disease)    Headache(784.0)    history of migraines   History of kidney stones    Hypercholesteremia    Hypertension    Leg swelling    Bilateral   Occasional tremors    mild hand tremors   Osteoarthritis    PONV (postoperative nausea and vomiting)    RBD (REM behavioral disorder)    Rheumatoid arthritis(714.0)    Scoliosis of lumbar spine    Spinal stenosis    Multiple levels   Tic [place]    Trigeminal neuralgia 03/30/2015   Vision abnormalities     PAST SURGICAL HISTORY: Past Surgical History:  Procedure Laterality Date   ABDOMINAL HYSTERECTOMY     AUGMENTATION MAMMAPLASTY Bilateral    COLONOSCOPY     JOINT REPLACEMENT     right uni knee, right hip with 2 revisions   KIDNEY STONE SURGERY Bilateral    KNEE ARTHROTOMY Right 02/27/2018   Procedure: RIGHT KNEE ARTHROTOMY; scar excision;  Surgeon: Ollen Gross, MD;  Location: [facility];  Service: Orthopedics;  Laterality: Right;   MOHS SURGERY     TONSILLECTOMY     TOTAL HIP ARTHROPLASTY     TOTAL KNEE ARTHROPLASTY  01/22/2013   Procedure: TOTAL KNEE ARTHROPLASTY;  Surgeon: Loanne Drilling, MD;  Location: [facility];  Service: Orthopedics;  Laterality: Right;  Revision of a Right Uni Knee to a Total Knee Arthroplasty    FAMILY HISTORY: Family History  Problem Relation Age of Onset   Heart disease Mother    Parkinson's disease Father     SOCIAL HISTORY:  Social History   Socioeconomic History   Marital status: Married    Spouse name: Not on file   Number of children: Not on file   Years of education: Not on file   Highest education level: Not on file  Occupational History   Not on file  Tobacco Use   Smoking status: Never   Smokeless  tobacco: Never  Vaping Use   Vaping Use: Never used  Substance and Sexual Activity   Alcohol use: Yes    Alcohol/week: 0.0 standard drinks of alcohol    Comment: occasional   Drug use: No   Sexual activity: Not on file  Other Topics Concern   Not on file  Social History Narrative   Lives with her Husband    Caffeine use: 1 Cup of Tea   Social Determinants of Health   Financial Resource Strain: Not on file  Food Insecurity: Not on file  Transportation Needs: Not on file  Physical Activity: Not on file  Stress: Not on file  Social Connections: Not on file  Intimate Partner Violence: Not on file     PHYSICAL EXAM  Vitals:   05/31/23 1130 05/31/23 1135  BP: (!) 174/84 (!) 160/66  Pulse: 76 76    There is no height or weight on file to calculate BMI.    General: The patient is well-developed and well-nourished and in no acute distress.  She has a cervical collar  HEENT:  Head is [place]/AT.  Sclera are anicteric.    Skin: Extremities are without rash or edema.  She has mild skin color changes in her lower legs  Neurologic Exam  Mental status: She appears bradykinetic. The patient is alert and oriented x 3 at the time of the examination. The patient has mildly reduced recent memory  with reduced attention span qualitatively   Speech has halting quality but sentence structure is fine.  Cranial nerves: Extraocular movements are full.   Facial strength and sensation are normal.   Trapezius and sternocleidomastoid strength is normal. No dysarthria is noted.    No obvious hearing deficits are noted, but Weber lateralizes to the left.  Motor: She has bradykinesia.  Muscle bulk is normal.   Tone is normal. Strength is  5 / 5 in all 4 extremities.   Sensory: Sensory testing is intact to pinprick, soft touch and vibration sensation in arms but reduced vibration and touch in feet (80-90% vib at ankles and 25% at toes)  Coordination: Cerebellar testing reveals good  finger-nose-finger and heel-to-shin bilaterally.  Gait and station: Station is normal.   Her gait is similar to last visit.  She can take some steps in the room without support she can turn 180 degrees and  5 steps.  Romberg is negative.   Reflexes: Deep tendon reflexes are symmetric and normal bilaterally.       DIAGNOSTIC DATA (LABS, IMAGING, TESTING) - I reviewed patient records, labs, notes, testing and imaging myself where available.  Lab Results  Component Value Date   WBC 4.7 06/03/2021   HGB 10.7 (L) 06/03/2021   HCT 32.3 (L) 06/03/2021   MCV 89 06/03/2021   PLT 211 06/03/2021      Component Value Date/Time   NA 139 06/03/2021 1014   K 4.5 06/03/2021 1014   CL 103 06/03/2021 1014   CO2 24 06/03/2021 1014   GLUCOSE 90 06/03/2021 1014   GLUCOSE 98 02/20/2018 1403   BUN 31 (H) 06/03/2021 1014   CREATININE 1.16 (H) 06/03/2021 1014   CALCIUM 9.5 06/03/2021 1014   PROT 6.4 06/03/2021 1014   ALBUMIN 4.3 06/03/2021 1014   AST 11 06/03/2021 1014   ALT 10 06/03/2021 1014   ALKPHOS 129 (H) 06/03/2021 1014   BILITOT <0.2 06/03/2021 1014   GFRNONAA 52 (L) 02/20/2018 1403   GFRAA >60 02/20/2018 1403       ASSESSMENT AND PLAN  Lewy body dementia without behavioral disturbance, psychotic disturbance, mood disturbance, or anxiety, unspecified dementia severity (HCC)  REM behavioral disorder  Orthostatic hypotension  Trigeminal neuralgia  Depression, unspecified depression type  Rheumatoid arthritis, involving unspecified site, unspecified whether rheumatoid factor present (HCC)  Hyperthyroidism    1.  She most likely has Lewy body disease or, less likely, multisystem atrophy  Her REM behavior disorder improved on clonazepam.  Dizzy spells likely due to orthostatic hypotension (and improved on mestinon).  We discussed cutting pyridostigmine to 30 mg tid as may still help reduce orthostatic hypotension but have less diarrhea.    2.   Stay active and exercise as  tolerated. 3.  Continue lamotrigine and oxcarbazepine for Trigeminal neuralgia.   4.  For mood, continue sertraline 50 mg.   5.   Rtc 6 months or sooner if new or worsening issues  42-minute office visit with the majority of the time spent face-to-face for history and physical, discussion/counseling and decision-making.  Additional time with record review and documentation.  This visit is part of a comprehensive longitudinal care medical relationship regarding the patients primary diagnosis of Lewy Body disease and related concerns.    A. Epimenio Foot, MD, [facility] 05/31/2023, 4:56 PM Certified in Neurology, Clinical Neurophysiology, Sleep Medicine and Neuroimaging  [facility] [address] [place], [place] [address] [phone]

## 2023-06-07 DIAGNOSIS — E0590 Thyrotoxicosis, unspecified without thyrotoxic crisis or storm: Secondary | ICD-10-CM | POA: Insufficient documentation

## 2023-06-11 ENCOUNTER — Encounter (HOSPITAL_BASED_OUTPATIENT_CLINIC_OR_DEPARTMENT_OTHER): Payer: Self-pay

## 2023-06-11 ENCOUNTER — Emergency Department (HOSPITAL_BASED_OUTPATIENT_CLINIC_OR_DEPARTMENT_OTHER): Payer: Medicare PPO

## 2023-06-11 ENCOUNTER — Emergency Department (HOSPITAL_BASED_OUTPATIENT_CLINIC_OR_DEPARTMENT_OTHER)
Admission: EM | Admit: 2023-06-11 | Discharge: 2023-06-11 | Disposition: A | Discharge status: Home / Self Care | Payer: Medicare PPO | Attending: Emergency Medicine | Admitting: Emergency Medicine

## 2023-06-11 ENCOUNTER — Other Ambulatory Visit: Payer: Self-pay

## 2023-06-11 DIAGNOSIS — J189 Pneumonia, unspecified organism: Secondary | ICD-10-CM | POA: Insufficient documentation

## 2023-06-11 DIAGNOSIS — R059 Cough, unspecified: Secondary | ICD-10-CM | POA: Diagnosis present

## 2023-06-11 LAB — BASIC METABOLIC PANEL
Anion gap: 10 (ref 5–15)
BUN: 15 mg/dL (ref 8–23)
CO2: 24 mmol/L (ref 22–32)
Calcium: 8.7 mg/dL — ABNORMAL LOW (ref 8.9–10.3)
Chloride: 102 mmol/L (ref 98–111)
Creatinine, Ser: 0.83 mg/dL (ref 0.44–1.00)
GFR, Estimated: >60 mL/min (ref >60)
Glucose, Bld: 105 mg/dL — ABNORMAL HIGH (ref 70–99)
Potassium: 3.7 mmol/L (ref 3.5–5.1)
Sodium: 136 mmol/L (ref 135–145)

## 2023-06-11 LAB — CBC WITH DIFFERENTIAL/PLATELET
Abs Immature Granulocytes: 0.03 10*3/uL (ref 0.00–0.07)
Basophils Absolute: 0 10*3/uL (ref 0.0–0.1)
Basophils Relative: 0 %
Eosinophils Absolute: 0 10*3/uL (ref 0.0–0.5)
Eosinophils Relative: 0 %
HCT: 32.7 % — ABNORMAL LOW (ref 36.0–46.0)
Hemoglobin: 10.6 g/dL — ABNORMAL LOW (ref 12.0–15.0)
Immature Granulocytes: 0 %
Lymphocytes Relative: 14 %
Lymphs Abs: 1.1 10*3/uL (ref 0.7–4.0)
MCH: 27.8 pg (ref 26.0–34.0)
MCHC: 32.4 g/dL (ref 30.0–36.0)
MCV: 85.8 fL (ref 80.0–100.0)
Monocytes Absolute: 0.5 10*3/uL (ref 0.1–1.0)
Monocytes Relative: 7 %
Neutro Abs: 6 10*3/uL (ref 1.7–7.7)
Neutrophils Relative %: 79 %
Platelets: 198 10*3/uL (ref 150–400)
RBC: 3.81 MIL/uL — ABNORMAL LOW (ref 3.87–5.11)
RDW: 14.2 % (ref 11.5–15.5)
WBC: 7.6 10*3/uL (ref 4.0–10.5)
nRBC: 0 % (ref 0.0–0.2)

## 2023-06-11 LAB — HEPATIC FUNCTION PANEL
ALT: 26 U/L (ref 0–44)
AST: 25 U/L (ref 15–41)
Albumin: 3.2 g/dL — ABNORMAL LOW (ref 3.5–5.0)
Alkaline Phosphatase: 170 U/L — ABNORMAL HIGH (ref 38–126)
Bilirubin, Direct: 0.1 mg/dL (ref 0.0–0.2)
Indirect Bilirubin: 0.3 mg/dL (ref 0.3–0.9)
Total Bilirubin: 0.4 mg/dL (ref 0.3–1.2)
Total Protein: 6.4 g/dL — ABNORMAL LOW (ref 6.5–8.1)

## 2023-06-11 MED ORDER — DOXYCYCLINE HYCLATE 100 MG PO TABS
100.0000 mg | ORAL_TABLET | Freq: Once | ORAL | Status: AC
  Administered 2023-06-11: 100 mg via ORAL
  Filled 2023-06-11: qty 1

## 2023-06-11 MED ORDER — DOXYCYCLINE HYCLATE 100 MG PO CAPS: 100.0000 mg | ORAL_CAPSULE | Freq: Two times a day (BID) | ORAL | 0 refills | Status: DC

## 2023-06-11 NOTE — ED Provider Notes (Signed)
[facility] Provider Note   CSN: [id number] Arrival date & time: 06/11/23  1712     History  Chief Complaint  Patient presents with   Choking    Kristen Hansen is a 77 y.o. female.  Patient here after choking on some popcorn shrimp, roast beef.  History of chronic dysphagia and swallowing difficulties.  She supposed to start some speech therapy rehab to help with this.  History of rheumatoid arthritis, acid reflux, trigeminal neuralgia.  She denies any chest pain or shortness of breath.  She is feeling better now.  She has been able to tolerate fluids.  No loss of consciousness.  The history is provided by the patient.       Home Medications Prior to Admission medications   Medication Sig Start Date End Date Taking? Authorizing Provider  doxycycline (VIBRAMYCIN) 100 MG capsule Take 1 capsule (100 mg total) by mouth 2 (two) times daily. 06/11/23  Yes , , DO  acetaminophen-codeine (TYLENOL #3) 300-30 MG tablet Take 1-2 tablets by mouth every 6 (six) hours as needed (for pain). 06/25/21   Molpus, John, MD  amLODipine-olmesartan (AZOR) 5-40 MG tablet Take 1 tablet by mouth every evening.     [provider]  clonazePAM (KLONOPIN) 1 MG tablet TAKE 1 TABLET BY MOUTH AT BEDTIME 03/20/23   Sater, Pearletha Furl, MD  Coenzyme Q10 (CO Q 10 PO) Take 1 Dose by mouth daily.    [provider]  Docusate Calcium (STOOL SOFTENER PO) Take 1 tablet by mouth daily.    [provider]  ergocalciferol (VITAMIN D2) 1.25 MG (50000 UT) capsule ergocalciferol (vitamin D2) 1,250 mcg (50,000 unit) capsule  TAKE 1 CAPSULE BY MOUTH ONCE A WEEK FOR 12 WEEKS Patient not taking: Reported on 05/31/2023    [provider]  etanercept (ENBREL SURECLICK) 50 MG/ML injection 1 injection 06/08/21   [provider]  famotidine (PEPCID) 40 MG tablet Take 40 mg by mouth daily as needed. 01/25/22   [provider]   lamoTRIgine (LAMICTAL) 150 MG tablet Take 1 tablet by mouth once daily 10/06/22   Sater, Pearletha Furl, MD  loratadine (CLARITIN) 10 MG tablet Take 10 mg by mouth daily.    [provider]  mometasone (NASONEX) 50 MCG/ACT nasal spray Place 1 spray into the nose daily as needed (CONGESTION).     [provider]  montelukast (SINGULAIR) 10 MG tablet Take 10 mg by mouth at bedtime.    [provider]  omeprazole (PRILOSEC) 20 MG capsule Take 20 mg by mouth daily.     [provider]  ondansetron (ZOFRAN ODT) 8 MG disintegrating tablet Take 1 tablet (8 mg total) by mouth every 8 (eight) hours as needed for nausea or vomiting. 06/25/21   Molpus, John, MD  Oxcarbazepine (TRILEPTAL) 300 MG tablet TAKE 1 TABLET BY MOUTH IN THE MORNING AND 1 AT NOON AND 2 AT BEDTIME 03/15/23   Sater, Pearletha Furl, MD  pravastatin (PRAVACHOL) 40 MG tablet Take 40 mg by mouth daily before breakfast.     [provider]  pyridostigmine (MESTINON) 60 MG tablet TAKE 2 TO 3 TABS A DAY 01/01/23   Sater, Pearletha Furl, MD  sertraline (ZOLOFT) 50 MG tablet Take 1 tablet by mouth once daily 01/22/23   Sater, Pearletha Furl, MD  valACYclovir (VALTREX) 500 MG tablet TAKE 2 TABLETS BY MOUTH EVERY 12 HOURS AS NEEDED FOR FEVER BLISTERS    [provider]  Allergies    Morphine and codeine, Prevacid [lansoprazole], Provigil [modafinil], and Adhesive [tape]    Review of Systems   Review of Systems  Physical Exam Updated Vital Signs BP (!) 193/74   Pulse 78   Temp 97.8 F (36.6 C)   Resp 18   Ht 5\' 10"  (1.778 m)   Wt 61.7 kg   SpO2 97%   BMI 19.51 kg/m  Physical Exam Vitals and nursing note reviewed.  Constitutional:      General: She is not in acute distress.    Appearance: She is well-developed.  HENT:     Head: Normocephalic and atraumatic.     Mouth/Throat:     Mouth: Mucous membranes are moist.  Eyes:     Extraocular Movements: Extraocular movements intact.      Conjunctiva/sclera: Conjunctivae normal.     Pupils: Pupils are equal, round, and reactive to light.  Cardiovascular:     Rate and Rhythm: Normal rate and regular rhythm.     Pulses: Normal pulses.     Heart sounds: Normal heart sounds. No murmur heard. Pulmonary:     Effort: Pulmonary effort is normal. No respiratory distress.     Breath sounds: Normal breath sounds.  Abdominal:     Palpations: Abdomen is soft.     Tenderness: There is no abdominal tenderness.  Musculoskeletal:        General: No swelling.     Cervical back: Neck supple.  Skin:    General: Skin is warm and dry.     Capillary Refill: Capillary refill takes less than 2 seconds.  Neurological:     Mental Status: She is alert.  Psychiatric:        Mood and Affect: Mood normal.     ED Results / Procedures / Treatments   Labs (all labs ordered are listed, but only abnormal results are displayed) Labs Reviewed  CBC WITH DIFFERENTIAL/PLATELET - Abnormal; Notable for the following components:      Result Value   RBC 3.81 (*)    Hemoglobin 10.6 (*)    HCT 32.7 (*)    All other components within normal limits  BASIC METABOLIC PANEL - Abnormal; Notable for the following components:   Glucose, Bld 105 (*)    Calcium 8.7 (*)    All other components within normal limits  HEPATIC FUNCTION PANEL - Abnormal; Notable for the following components:   Total Protein 6.4 (*)    Albumin 3.2 (*)    Alkaline Phosphatase 170 (*)    All other components within normal limits    EKG None  Radiology DG Chest 2 View  Result Date: 06/11/2023 CLINICAL DATA:  Cough EXAM: CHEST - 2 VIEW COMPARISON:  Chest x-ray 02/20/2018 FINDINGS: Small bilateral pleural effusions. Subtle left midlung opacity. Infiltrates not excluded. No pneumothorax, edema. Mild apical pleural thickening. Normal cardiopericardial silhouette. Curvature and degenerative changes of the spine. Fixation hardware seen along the lower cervical spine at the edge of the  imaging field. IMPRESSION: Small pleural effusions. Subtle left midlung opacity. Acute process is possible. Recommend follow-up. Electronically Signed   By: Karen Kays M.D.   On: 06/11/2023 18:46    Procedures Procedures    Medications Ordered in ED Medications  doxycycline (VIBRA-TABS) tablet 100 mg (has no administration in time range)    ED Course/ Medical Decision Making/ A&P  Medical Decision Making Amount and/or Complexity of Data Reviewed Labs: ordered. Radiology: ordered.  Risk Prescription drug management.   Radonna Tait Clary is here after choking episode.  Normal vitals.  No fever.  Did not lose consciousness.  Choked up on some roast beef and popcorn shrimp tonight.  She has a history of chronic dysphagia and difficulty swallowing.  She recently had swallow studies and is referred for speech therapy to help strengthen muscles.  However does not sound like she has been adhering to a thin/pured/dysphagia type diet  She had no loss of consciousness today.  She is well-appearing.  Suspect some element of aspiration.  Chest x-ray suggest a least may be a mild left-sided pneumonia.  However she is not in any respiratory distress.  Blood work was obtained that was unremarkable.  She is able to drink water without any issues here.  I had extensive conversation about dysphagia diet.  Will put her on antibiotics and have her follow-up with primary care doctor.  Discharged in good condition.  This chart was dictated using voice recognition software.  Despite best efforts to proofread,  errors can occur which can change the documentation meaning.         Final Clinical Impression(s) / ED Diagnoses Final diagnoses:  Community acquired pneumonia, unspecified laterality    Rx / DC Orders ED Discharge Orders          Ordered    doxycycline (VIBRAMYCIN) 100 MG capsule  2 times daily        06/11/23 2157              Virgina Norfolk, DO 06/11/23  2159

## 2023-06-11 NOTE — ED Notes (Signed)
Pt/spouse report a choking episidoe thatwas at approx 330pm. Spouse admits to having to do the heimlich maneuver and she eventually coughed up a lot of mucus and meat.   Pt states her throat is sore and she has continued to cough since the incident.

## 2023-06-11 NOTE — ED Triage Notes (Addendum)
Pt arrives with c/o choking on roast beef and shrimp today. Per pt, she was able to cough up the food, but has been having issues choking. Per family, they have been trying to work up the swallowing difficulty. Pt endorses coughing.

## 2023-06-13 ENCOUNTER — Other Ambulatory Visit: Payer: Self-pay

## 2023-06-13 ENCOUNTER — Emergency Department (HOSPITAL_BASED_OUTPATIENT_CLINIC_OR_DEPARTMENT_OTHER): Payer: Medicare PPO

## 2023-06-13 ENCOUNTER — Emergency Department (HOSPITAL_BASED_OUTPATIENT_CLINIC_OR_DEPARTMENT_OTHER)
Admission: EM | Admit: 2023-06-13 | Discharge: 2023-06-13 | Disposition: A | Discharge status: Home / Self Care | Payer: Medicare PPO | Attending: Emergency Medicine | Admitting: Emergency Medicine

## 2023-06-13 DIAGNOSIS — R051 Acute cough: Secondary | ICD-10-CM | POA: Insufficient documentation

## 2023-06-13 DIAGNOSIS — R059 Cough, unspecified: Secondary | ICD-10-CM | POA: Diagnosis present

## 2023-06-13 DIAGNOSIS — Z1152 Encounter for screening for COVID-19: Secondary | ICD-10-CM | POA: Diagnosis not present

## 2023-06-13 DIAGNOSIS — R070 Pain in throat: Secondary | ICD-10-CM | POA: Insufficient documentation

## 2023-06-13 LAB — SARS CORONAVIRUS 2 BY RT PCR: SARS Coronavirus 2 by RT PCR: NEGATIVE

## 2023-06-13 MED ORDER — BENZONATATE 100 MG PO CAPS: 100.0000 mg | ORAL_CAPSULE | Freq: Three times a day (TID) | ORAL | 0 refills | Status: DC

## 2023-06-13 NOTE — Discharge Instructions (Addendum)
You were seen in the emergency department for continued cough.  You had x-rays of your neck and a COVID swab that did not show any obvious findings.  We are prescribing you medication to help with your cough.  Please continue your antibiotics until they are finished.  Soft diet.

## 2023-06-13 NOTE — ED Provider Notes (Signed)
[place] [facility] Provider Note   CSN: [id number] Arrival date & time: 06/13/23  1412     History  Chief Complaint  Patient presents with   Cough    Kristen Hansen is a 77 y.o. female.  She is here for evaluation of continued cough it has been going on for 2 weeks.  It started with difficulty swallowing and choking episode.  She has been having ongoing cough minimally productive since then.  She also has some discomfort in the left side of her throat especially with swallowing.  She had some unremarkable labs and chest x-ray 2 days ago.  Started on antibiotics doxycycline for possible bronchitis versus early pneumonia.  Was recommended to use a dysphagia diet.  She said her throat maybe is a little bit better but the cough persist.  No fevers chills nausea vomiting.  Tried to get in with her PCP but they were unable to see her.  The history is provided by the patient and the spouse.  Cough Cough characteristics:  Non-productive Sputum characteristics:  Nondescript Severity:  Moderate Onset quality:  Gradual Duration:  2 weeks Timing:  Intermittent Progression:  Unchanged Context comment:  Choking episode Relieved by:  Nothing Worsened by:  Nothing Ineffective treatments: abx. Associated symptoms: ear pain and sore throat   Associated symptoms: no chest pain, no fever and no rhinorrhea        Home Medications Prior to Admission medications   Medication Sig Start Date End Date Taking? Authorizing Provider  acetaminophen-codeine (TYLENOL #3) 300-30 MG tablet Take 1-2 tablets by mouth every 6 (six) hours as needed (for pain). 06/25/21   Molpus, John, MD  amLODipine-olmesartan (AZOR) 5-40 MG tablet Take 1 tablet by mouth every evening.     [provider]  clonazePAM (KLONOPIN) 1 MG tablet TAKE 1 TABLET BY MOUTH AT BEDTIME 03/20/23   Sater, Pearletha Furl, MD  Coenzyme Q10 (CO Q 10 PO) Take 1 Dose by mouth daily.    [provider]   Docusate Calcium (STOOL SOFTENER PO) Take 1 tablet by mouth daily.    [provider]  doxycycline (VIBRAMYCIN) 100 MG capsule Take 1 capsule (100 mg total) by mouth 2 (two) times daily. 06/11/23   Curatolo, Adam, DO  ergocalciferol (VITAMIN D2) 1.25 MG (50000 UT) capsule ergocalciferol (vitamin D2) 1,250 mcg (50,000 unit) capsule  TAKE 1 CAPSULE BY MOUTH ONCE A WEEK FOR 12 WEEKS Patient not taking: Reported on 05/31/2023    [provider]  etanercept (ENBREL SURECLICK) 50 MG/ML injection 1 injection 06/08/21   [provider]  famotidine (PEPCID) 40 MG tablet Take 40 mg by mouth daily as needed. 01/25/22   [provider]  lamoTRIgine (LAMICTAL) 150 MG tablet Take 1 tablet by mouth once daily 10/06/22   Sater, Pearletha Furl, MD  loratadine (CLARITIN) 10 MG tablet Take 10 mg by mouth daily.    [provider]  mometasone (NASONEX) 50 MCG/ACT nasal spray Place 1 spray into the nose daily as needed (CONGESTION).     [provider]  montelukast (SINGULAIR) 10 MG tablet Take 10 mg by mouth at bedtime.    [provider]  omeprazole (PRILOSEC) 20 MG capsule Take 20 mg by mouth daily.     [provider]  ondansetron (ZOFRAN ODT) 8 MG disintegrating tablet Take 1 tablet (8 mg total) by mouth every 8 (eight) hours as needed for nausea or vomiting. 06/25/21   Molpus, Jonny Ruiz, MD  Oxcarbazepine (TRILEPTAL) 300 MG tablet TAKE 1 TABLET BY MOUTH IN THE MORNING AND 1 AT NOON AND 2 AT BEDTIME 03/15/23   Sater, Pearletha Furl, MD  pravastatin (PRAVACHOL) 40 MG tablet Take 40 mg by mouth daily before breakfast.     [provider]  pyridostigmine (MESTINON) 60 MG tablet TAKE 2 TO 3 TABS A DAY 01/01/23   Sater, Pearletha Furl, MD  sertraline (ZOLOFT) 50 MG tablet Take 1 tablet by mouth once daily 01/22/23   Sater, Pearletha Furl, MD  valACYclovir (VALTREX) 500 MG tablet TAKE 2 TABLETS BY MOUTH EVERY 12 HOURS AS NEEDED FOR FEVER BLISTERS    [provider]       Allergies    Morphine and codeine, Prevacid [lansoprazole], Provigil [modafinil], and Adhesive [tape]    Review of Systems   Review of Systems  Constitutional:  Negative for fever.  HENT:  Positive for ear pain, sore throat and trouble swallowing. Negative for rhinorrhea.   Respiratory:  Positive for cough.   Cardiovascular:  Negative for chest pain.    Physical Exam Updated Vital Signs BP (!) 165/66 (BP Location: Left Arm)   Pulse 84   Temp 98 F (36.7 C)   Resp 20   SpO2 96%  Physical Exam Vitals and nursing note reviewed.  Constitutional:      General: She is not in acute distress.    Appearance: Normal appearance. She is well-developed.  HENT:     Head: Normocephalic and atraumatic.     Right Ear: Tympanic membrane, ear canal and external ear normal.     Left Ear: Tympanic membrane, ear canal and external ear normal.     Nose: Nose normal. No congestion or rhinorrhea.     Mouth/Throat:     Mouth: Mucous membranes are moist.     Pharynx: Oropharynx is clear. No oropharyngeal exudate or posterior oropharyngeal erythema.  Eyes:     Conjunctiva/sclera: Conjunctivae normal.  Neck:     Comments: No stridor Cardiovascular:     Rate and Rhythm: Normal rate and regular rhythm.     Heart sounds: No murmur heard. Pulmonary:     Effort: Pulmonary effort is normal. No respiratory distress.     Breath sounds: Normal breath sounds.  Abdominal:     Palpations: Abdomen is soft.     Tenderness: There is no abdominal tenderness. There is no guarding or rebound.  Musculoskeletal:        General: No deformity.     Cervical back: Neck supple. No tenderness.  Skin:    General: Skin is warm and dry.     Capillary Refill: Capillary refill takes less than 2 seconds.  Neurological:     General: No focal deficit present.     Mental Status: She is alert.     ED Results / Procedures / Treatments   Labs (all labs ordered are listed, but only abnormal results are  displayed) Labs Reviewed  SARS CORONAVIRUS 2 BY RT PCR    EKG None  Radiology DG Chest 2 View  Result Date: 06/11/2023 CLINICAL DATA:  Cough EXAM: CHEST - 2 VIEW COMPARISON:  Chest x-ray 02/20/2018 FINDINGS: Small bilateral pleural effusions. Subtle left midlung opacity. Infiltrates not excluded. No pneumothorax, edema. Mild apical pleural thickening. Normal cardiopericardial silhouette. Curvature and degenerative changes of the spine. Fixation hardware seen along the lower cervical spine at the edge of the imaging field. IMPRESSION: Small pleural effusions. Subtle left midlung opacity. Acute process is possible. Recommend follow-up. Electronically  Signed   By: Karen Kays M.D.   On: 06/11/2023 18:46    Procedures Procedures    Medications Ordered in ED Medications - No data to display  ED Course/ Medical Decision Making/ A&P Clinical Course as of 06/14/23 0947  Wed Jun 13, 2023  1548 X-ray soft tissue of neck not show any obvious foreign body.  Hardware intact.  Awaiting radiology reading. [MB]    Clinical Course User Index [MB] Terrilee Files, MD                             Medical Decision Making Amount and/or Complexity of Data Reviewed Radiology: ordered.  Risk Prescription drug management.   This patient complains of cough sore throat; this involves an extensive number of treatment Options and is a complaint that carries with it a high risk of complications and morbidity. The differential includes reflux, bronchitis, COVID, pneumonia, strep throat, foreign body  I ordered, reviewed and interpreted labs, which included COVID test negative  I ordered imaging studies which included soft tissue neck and I independently    visualized and interpreted imaging which showed no acute findings Additional history obtained from patient's husband Previous records obtained and reviewed in epic including ED visit 2 days ago where she received lab work chest x-ray and placed  on doxycycline Cardiac monitoring reviewed, sinus rhythm Social determinants considered, no significant barriers Critical Interventions: None  After the interventions stated above, I reevaluated the patient and found patient had clear lungs oxygenating well with no distress Admission and further testing considered, no indications for admission or further workup at this time.  Given prescription for Tessalon Perles and recommended close follow-up with PCP.  Return instructions discussed         Final Clinical Impression(s) / ED Diagnoses Final diagnoses:  Acute cough  Throat discomfort    Rx / DC Orders ED Discharge Orders          Ordered    benzonatate (TESSALON) 100 MG capsule  Every 8 hours        06/13/23 1620              Terrilee Files, MD 06/14/23 [phone]

## 2023-06-13 NOTE — ED Notes (Signed)
Cannot discharge, registration in chart.   

## 2023-06-13 NOTE — ED Triage Notes (Signed)
Pt states she has been coughing for the past few weeks, came here the other nigh for same. Not getting any better. Pt states her voice is cracking.

## 2023-07-25 ENCOUNTER — Other Ambulatory Visit: Payer: Self-pay

## 2023-07-25 ENCOUNTER — Emergency Department (HOSPITAL_BASED_OUTPATIENT_CLINIC_OR_DEPARTMENT_OTHER): Payer: Medicare PPO

## 2023-07-25 ENCOUNTER — Encounter (HOSPITAL_BASED_OUTPATIENT_CLINIC_OR_DEPARTMENT_OTHER): Payer: Self-pay

## 2023-07-25 ENCOUNTER — Inpatient Hospital Stay (HOSPITAL_BASED_OUTPATIENT_CLINIC_OR_DEPARTMENT_OTHER)
Admission: EM | Admit: 2023-07-25 | Discharge: 2023-08-08 | DRG: 286 | Disposition: A | Discharge status: Skilled Nursing Facility | Payer: Medicare PPO | Attending: Internal Medicine | Admitting: Internal Medicine

## 2023-07-25 DIAGNOSIS — R4182 Altered mental status, unspecified: Secondary | ICD-10-CM | POA: Diagnosis not present

## 2023-07-25 DIAGNOSIS — G8929 Other chronic pain: Secondary | ICD-10-CM | POA: Diagnosis present

## 2023-07-25 DIAGNOSIS — I272 Pulmonary hypertension, unspecified: Secondary | ICD-10-CM | POA: Diagnosis present

## 2023-07-25 DIAGNOSIS — G9341 Metabolic encephalopathy: Secondary | ICD-10-CM | POA: Diagnosis not present

## 2023-07-25 DIAGNOSIS — I5032 Chronic diastolic (congestive) heart failure: Secondary | ICD-10-CM | POA: Diagnosis not present

## 2023-07-25 DIAGNOSIS — R072 Precordial pain: Secondary | ICD-10-CM | POA: Diagnosis not present

## 2023-07-25 DIAGNOSIS — I081 Rheumatic disorders of both mitral and tricuspid valves: Secondary | ICD-10-CM | POA: Diagnosis present

## 2023-07-25 DIAGNOSIS — I480 Paroxysmal atrial fibrillation: Secondary | ICD-10-CM | POA: Diagnosis present

## 2023-07-25 DIAGNOSIS — I110 Hypertensive heart disease with heart failure: Secondary | ICD-10-CM | POA: Diagnosis not present

## 2023-07-25 DIAGNOSIS — M1990 Unspecified osteoarthritis, unspecified site: Secondary | ICD-10-CM | POA: Diagnosis present

## 2023-07-25 DIAGNOSIS — Z79899 Other long term (current) drug therapy: Secondary | ICD-10-CM

## 2023-07-25 DIAGNOSIS — E039 Hypothyroidism, unspecified: Secondary | ICD-10-CM | POA: Diagnosis present

## 2023-07-25 DIAGNOSIS — Z681 Body mass index (BMI) 19 or less, adult: Secondary | ICD-10-CM | POA: Diagnosis not present

## 2023-07-25 DIAGNOSIS — M48061 Spinal stenosis, lumbar region without neurogenic claudication: Secondary | ICD-10-CM | POA: Diagnosis present

## 2023-07-25 DIAGNOSIS — I82539 Chronic embolism and thrombosis of unspecified popliteal vein: Secondary | ICD-10-CM | POA: Diagnosis not present

## 2023-07-25 DIAGNOSIS — M069 Rheumatoid arthritis, unspecified: Secondary | ICD-10-CM | POA: Diagnosis present

## 2023-07-25 DIAGNOSIS — E0590 Thyrotoxicosis, unspecified without thyrotoxic crisis or storm: Secondary | ICD-10-CM | POA: Diagnosis present

## 2023-07-25 DIAGNOSIS — R636 Underweight: Secondary | ICD-10-CM | POA: Diagnosis present

## 2023-07-25 DIAGNOSIS — I3139 Other pericardial effusion (noninflammatory): Secondary | ICD-10-CM | POA: Diagnosis present

## 2023-07-25 DIAGNOSIS — G500 Trigeminal neuralgia: Secondary | ICD-10-CM | POA: Diagnosis present

## 2023-07-25 DIAGNOSIS — G4752 REM sleep behavior disorder: Secondary | ICD-10-CM | POA: Diagnosis present

## 2023-07-25 DIAGNOSIS — F32A Depression, unspecified: Secondary | ICD-10-CM | POA: Diagnosis not present

## 2023-07-25 DIAGNOSIS — I10 Essential (primary) hypertension: Secondary | ICD-10-CM | POA: Diagnosis not present

## 2023-07-25 DIAGNOSIS — Z885 Allergy status to narcotic agent status: Secondary | ICD-10-CM

## 2023-07-25 DIAGNOSIS — R079 Chest pain, unspecified: Secondary | ICD-10-CM | POA: Diagnosis not present

## 2023-07-25 DIAGNOSIS — R188 Other ascites: Secondary | ICD-10-CM | POA: Diagnosis present

## 2023-07-25 DIAGNOSIS — E876 Hypokalemia: Secondary | ICD-10-CM | POA: Diagnosis not present

## 2023-07-25 DIAGNOSIS — Z85828 Personal history of other malignant neoplasm of skin: Secondary | ICD-10-CM

## 2023-07-25 DIAGNOSIS — E782 Mixed hyperlipidemia: Secondary | ICD-10-CM | POA: Diagnosis present

## 2023-07-25 DIAGNOSIS — J918 Pleural effusion in other conditions classified elsewhere: Secondary | ICD-10-CM | POA: Diagnosis present

## 2023-07-25 DIAGNOSIS — R1310 Dysphagia, unspecified: Secondary | ICD-10-CM | POA: Diagnosis present

## 2023-07-25 DIAGNOSIS — F329 Major depressive disorder, single episode, unspecified: Secondary | ICD-10-CM | POA: Diagnosis present

## 2023-07-25 DIAGNOSIS — G3183 Dementia with Lewy bodies: Secondary | ICD-10-CM | POA: Diagnosis present

## 2023-07-25 DIAGNOSIS — I5033 Acute on chronic diastolic (congestive) heart failure: Secondary | ICD-10-CM | POA: Diagnosis present

## 2023-07-25 DIAGNOSIS — I951 Orthostatic hypotension: Secondary | ICD-10-CM | POA: Diagnosis present

## 2023-07-25 DIAGNOSIS — J90 Pleural effusion, not elsewhere classified: Secondary | ICD-10-CM | POA: Diagnosis not present

## 2023-07-25 DIAGNOSIS — I300 Acute nonspecific idiopathic pericarditis: Secondary | ICD-10-CM | POA: Diagnosis present

## 2023-07-25 DIAGNOSIS — R54 Age-related physical debility: Secondary | ICD-10-CM | POA: Diagnosis present

## 2023-07-25 DIAGNOSIS — R9431 Abnormal electrocardiogram [ECG] [EKG]: Secondary | ICD-10-CM

## 2023-07-25 DIAGNOSIS — K2100 Gastro-esophageal reflux disease with esophagitis, without bleeding: Secondary | ICD-10-CM | POA: Diagnosis not present

## 2023-07-25 DIAGNOSIS — Z86718 Personal history of other venous thrombosis and embolism: Secondary | ICD-10-CM

## 2023-07-25 DIAGNOSIS — N1831 Chronic kidney disease, stage 3a: Secondary | ICD-10-CM | POA: Diagnosis present

## 2023-07-25 DIAGNOSIS — N183 Chronic kidney disease, stage 3 unspecified: Secondary | ICD-10-CM | POA: Diagnosis present

## 2023-07-25 DIAGNOSIS — I130 Hypertensive heart and chronic kidney disease with heart failure and stage 1 through stage 4 chronic kidney disease, or unspecified chronic kidney disease: Secondary | ICD-10-CM | POA: Diagnosis not present

## 2023-07-25 DIAGNOSIS — R278 Other lack of coordination: Secondary | ICD-10-CM | POA: Diagnosis not present

## 2023-07-25 DIAGNOSIS — I5031 Acute diastolic (congestive) heart failure: Secondary | ICD-10-CM | POA: Diagnosis not present

## 2023-07-25 DIAGNOSIS — Z4682 Encounter for fitting and adjustment of non-vascular catheter: Secondary | ICD-10-CM

## 2023-07-25 DIAGNOSIS — M3500 Sicca syndrome, unspecified: Secondary | ICD-10-CM | POA: Diagnosis present

## 2023-07-25 DIAGNOSIS — G459 Transient cerebral ischemic attack, unspecified: Secondary | ICD-10-CM | POA: Diagnosis not present

## 2023-07-25 DIAGNOSIS — E860 Dehydration: Secondary | ICD-10-CM | POA: Diagnosis not present

## 2023-07-25 DIAGNOSIS — M545 Low back pain, unspecified: Secondary | ICD-10-CM | POA: Diagnosis present

## 2023-07-25 DIAGNOSIS — L89156 Pressure-induced deep tissue damage of sacral region: Secondary | ICD-10-CM | POA: Diagnosis not present

## 2023-07-25 DIAGNOSIS — E871 Hypo-osmolality and hyponatremia: Secondary | ICD-10-CM | POA: Diagnosis present

## 2023-07-25 DIAGNOSIS — R569 Unspecified convulsions: Secondary | ICD-10-CM | POA: Diagnosis not present

## 2023-07-25 DIAGNOSIS — Z820 Family history of epilepsy and other diseases of the nervous system: Secondary | ICD-10-CM

## 2023-07-25 DIAGNOSIS — Z7901 Long term (current) use of anticoagulants: Secondary | ICD-10-CM

## 2023-07-25 DIAGNOSIS — Z96651 Presence of right artificial knee joint: Secondary | ICD-10-CM | POA: Diagnosis present

## 2023-07-25 DIAGNOSIS — Z8249 Family history of ischemic heart disease and other diseases of the circulatory system: Secondary | ICD-10-CM

## 2023-07-25 DIAGNOSIS — Z888 Allergy status to other drugs, medicaments and biological substances status: Secondary | ICD-10-CM

## 2023-07-25 DIAGNOSIS — M7989 Other specified soft tissue disorders: Secondary | ICD-10-CM | POA: Diagnosis not present

## 2023-07-25 DIAGNOSIS — I308 Other forms of acute pericarditis: Secondary | ICD-10-CM | POA: Diagnosis not present

## 2023-07-25 DIAGNOSIS — F0280 Dementia in other diseases classified elsewhere without behavioral disturbance: Secondary | ICD-10-CM | POA: Diagnosis present

## 2023-07-25 DIAGNOSIS — M419 Scoliosis, unspecified: Secondary | ICD-10-CM | POA: Diagnosis present

## 2023-07-25 LAB — CBC WITH DIFFERENTIAL/PLATELET
Abs Immature Granulocytes: 0.05 10*3/uL (ref 0.00–0.07)
Basophils Absolute: 0 10*3/uL (ref 0.0–0.1)
Basophils Relative: 0 %
Eosinophils Absolute: 0.2 10*3/uL (ref 0.0–0.5)
Eosinophils Relative: 2 %
HCT: 33.1 % — ABNORMAL LOW (ref 36.0–46.0)
Hemoglobin: 10.6 g/dL — ABNORMAL LOW (ref 12.0–15.0)
Immature Granulocytes: 0 %
Lymphocytes Relative: 5 %
Lymphs Abs: 0.7 10*3/uL (ref 0.7–4.0)
MCH: 26.5 pg (ref 26.0–34.0)
MCHC: 32 g/dL (ref 30.0–36.0)
MCV: 82.8 fL (ref 80.0–100.0)
Monocytes Absolute: 0.9 10*3/uL (ref 0.1–1.0)
Monocytes Relative: 7 %
Neutro Abs: 10.8 10*3/uL — ABNORMAL HIGH (ref 1.7–7.7)
Neutrophils Relative %: 86 %
Platelets: 301 10*3/uL (ref 150–400)
RBC: 4 MIL/uL (ref 3.87–5.11)
RDW: 14.4 % (ref 11.5–15.5)
WBC: 12.7 10*3/uL — ABNORMAL HIGH (ref 4.0–10.5)
nRBC: 0 % (ref 0.0–0.2)

## 2023-07-25 LAB — D-DIMER, QUANTITATIVE: D-Dimer, Quant: 3.49 ug/mL-FEU — ABNORMAL HIGH (ref 0.00–0.50)

## 2023-07-25 LAB — COMPREHENSIVE METABOLIC PANEL
ALT: 13 U/L (ref 0–44)
AST: 18 U/L (ref 15–41)
Albumin: 2.8 g/dL — ABNORMAL LOW (ref 3.5–5.0)
Alkaline Phosphatase: 141 U/L — ABNORMAL HIGH (ref 38–126)
Anion gap: 9 (ref 5–15)
BUN: 21 mg/dL (ref 8–23)
CO2: 23 mmol/L (ref 22–32)
Calcium: 8.8 mg/dL — ABNORMAL LOW (ref 8.9–10.3)
Chloride: 102 mmol/L (ref 98–111)
Creatinine, Ser: 1.04 mg/dL — ABNORMAL HIGH (ref 0.44–1.00)
GFR, Estimated: 56 mL/min — ABNORMAL LOW (ref >60)
Glucose, Bld: 126 mg/dL — ABNORMAL HIGH (ref 70–99)
Potassium: 3.9 mmol/L (ref 3.5–5.1)
Sodium: 134 mmol/L — ABNORMAL LOW (ref 135–145)
Total Bilirubin: 0.4 mg/dL (ref 0.3–1.2)
Total Protein: 6.6 g/dL (ref 6.5–8.1)

## 2023-07-25 LAB — PROTIME-INR
INR: 1.2 (ref 0.8–1.2)
Prothrombin Time: 15.4 seconds — ABNORMAL HIGH (ref 11.4–15.2)

## 2023-07-25 LAB — TROPONIN I (HIGH SENSITIVITY)
Troponin I (High Sensitivity): 30 ng/L — ABNORMAL HIGH (ref <18)
Troponin I (High Sensitivity): 34 ng/L — ABNORMAL HIGH (ref <18)
Troponin I (High Sensitivity): 38 ng/L — ABNORMAL HIGH (ref <18)
Troponin I (High Sensitivity): 42 ng/L — ABNORMAL HIGH (ref <18)
Troponin I (High Sensitivity): 41 ng/L — ABNORMAL HIGH (ref <18)

## 2023-07-25 LAB — LACTATE DEHYDROGENASE: LDH: 176 U/L (ref 98–192)

## 2023-07-25 LAB — BRAIN NATRIURETIC PEPTIDE: B Natriuretic Peptide: 733.2 pg/mL — ABNORMAL HIGH (ref 0.0–100.0)

## 2023-07-25 MED ORDER — OXCARBAZEPINE 150 MG PO TABS
300.0000 mg | ORAL_TABLET | Freq: Two times a day (BID) | ORAL | Status: DC
  Administered 2023-07-25 – 2023-08-08 (×27): 300 mg via ORAL
  Filled 2023-07-25 (×2): qty 2
  Filled 2023-07-25: qty 1
  Filled 2023-07-25 (×5): qty 2
  Filled 2023-07-25 (×2): qty 1
  Filled 2023-07-25 (×2): qty 2
  Filled 2023-07-25 (×4): qty 1
  Filled 2023-07-25 (×2): qty 2
  Filled 2023-07-25 (×3): qty 1
  Filled 2023-07-25 (×7): qty 2

## 2023-07-25 MED ORDER — ACETAMINOPHEN 325 MG PO TABS
650.0000 mg | ORAL_TABLET | Freq: Four times a day (QID) | ORAL | Status: DC | PRN
  Administered 2023-07-30: 650 mg via ORAL
  Filled 2023-07-25 (×2): qty 2

## 2023-07-25 MED ORDER — IOHEXOL 350 MG/ML SOLN
75.0000 mL | Freq: Once | INTRAVENOUS | Status: AC | PRN
  Administered 2023-07-25: 75 mL via INTRAVENOUS

## 2023-07-25 MED ORDER — AMLODIPINE BESYLATE 5 MG PO TABS
5.0000 mg | ORAL_TABLET | Freq: Every evening | ORAL | Status: DC
  Administered 2023-07-25: 5 mg via ORAL
  Filled 2023-07-25 (×2): qty 1

## 2023-07-25 MED ORDER — NITROGLYCERIN 0.4 MG SL SUBL
0.4000 mg | SUBLINGUAL_TABLET | SUBLINGUAL | Status: DC | PRN
  Administered 2023-07-25 (×2): 0.4 mg via SUBLINGUAL
  Filled 2023-07-25: qty 1

## 2023-07-25 MED ORDER — PYRIDOSTIGMINE BROMIDE 60 MG PO TABS
60.0000 mg | ORAL_TABLET | Freq: Three times a day (TID) | ORAL | Status: DC
  Administered 2023-07-26 – 2023-08-08 (×37): 60 mg via ORAL
  Filled 2023-07-25 (×42): qty 1

## 2023-07-25 MED ORDER — PYRIDOSTIGMINE BROMIDE 60 MG PO TABS
120.0000 mg | ORAL_TABLET | Freq: Three times a day (TID) | ORAL | Status: DC
  Filled 2023-07-25 (×2): qty 2

## 2023-07-25 MED ORDER — LAMOTRIGINE 100 MG PO TABS: 150.0000 mg | ORAL_TABLET | Freq: Every day | ORAL | Status: DC

## 2023-07-25 MED ORDER — ASPIRIN 81 MG PO CHEW
324.0000 mg | CHEWABLE_TABLET | Freq: Once | ORAL | Status: AC
  Administered 2023-07-25: 324 mg via ORAL
  Filled 2023-07-25: qty 4

## 2023-07-25 MED ORDER — METOPROLOL TARTRATE 5 MG/5ML IV SOLN: 10.0000 mg | Freq: Four times a day (QID) | INTRAVENOUS | Status: DC | PRN

## 2023-07-25 MED ORDER — CLONAZEPAM 0.5 MG PO TABS
1.0000 mg | ORAL_TABLET | Freq: Every day | ORAL | Status: DC
  Administered 2023-07-25 – 2023-07-30 (×3): 1 mg via ORAL
  Filled 2023-07-25: qty 2
  Filled 2023-07-25: qty 1
  Filled 2023-07-25 (×4): qty 2

## 2023-07-25 MED ORDER — SERTRALINE HCL 50 MG PO TABS
50.0000 mg | ORAL_TABLET | Freq: Every day | ORAL | Status: DC
  Administered 2023-07-25 – 2023-08-08 (×15): 50 mg via ORAL
  Filled 2023-07-25 (×15): qty 1

## 2023-07-25 MED ORDER — METOPROLOL TARTRATE 5 MG/5ML IV SOLN
10.0000 mg | Freq: Once | INTRAVENOUS | Status: DC
  Filled 2023-07-25: qty 10

## 2023-07-25 MED ORDER — PANTOPRAZOLE SODIUM 40 MG PO TBEC
40.0000 mg | DELAYED_RELEASE_TABLET | Freq: Every day | ORAL | Status: DC
  Administered 2023-07-26 – 2023-08-08 (×14): 40 mg via ORAL
  Filled 2023-07-25 (×14): qty 1

## 2023-07-25 MED ORDER — POLYETHYLENE GLYCOL 3350 17 G PO PACK: 17.0000 g | PACK | Freq: Every day | ORAL | Status: DC | PRN

## 2023-07-25 MED ORDER — ENOXAPARIN SODIUM 40 MG/0.4ML IJ SOSY
40.0000 mg | PREFILLED_SYRINGE | INTRAMUSCULAR | Status: DC
  Administered 2023-07-25 – 2023-07-26 (×2): 40 mg via SUBCUTANEOUS
  Filled 2023-07-25 (×2): qty 0.4

## 2023-07-25 MED ORDER — FUROSEMIDE 20 MG PO TABS
20.0000 mg | ORAL_TABLET | Freq: Every day | ORAL | Status: DC
  Administered 2023-07-25: 20 mg via ORAL
  Filled 2023-07-25: qty 1

## 2023-07-25 MED ORDER — SODIUM CHLORIDE 0.9% FLUSH
3.0000 mL | Freq: Two times a day (BID) | INTRAVENOUS | Status: DC
  Administered 2023-07-25 – 2023-08-08 (×12): 3 mL via INTRAVENOUS

## 2023-07-25 MED ORDER — SPIRONOLACTONE 25 MG PO TABS
25.0000 mg | ORAL_TABLET | Freq: Every day | ORAL | Status: DC
  Administered 2023-07-25: 25 mg via ORAL
  Filled 2023-07-25: qty 2

## 2023-07-25 MED ORDER — AMLODIPINE-OLMESARTAN 5-40 MG PO TABS: 1.0000 | ORAL_TABLET | Freq: Every evening | ORAL | Status: DC

## 2023-07-25 MED ORDER — PRAVASTATIN SODIUM 40 MG PO TABS
40.0000 mg | ORAL_TABLET | Freq: Every day | ORAL | Status: DC
  Administered 2023-07-26 – 2023-08-08 (×14): 40 mg via ORAL
  Filled 2023-07-25 (×14): qty 1

## 2023-07-25 MED ORDER — IRBESARTAN 300 MG PO TABS
300.0000 mg | ORAL_TABLET | Freq: Every evening | ORAL | Status: DC
  Administered 2023-07-25: 300 mg via ORAL
  Filled 2023-07-25: qty 1
  Filled 2023-07-25: qty 2

## 2023-07-25 MED ORDER — ACETAMINOPHEN 650 MG RE SUPP: 650.0000 mg | Freq: Four times a day (QID) | RECTAL | Status: DC | PRN

## 2023-07-25 MED ORDER — METOPROLOL TARTRATE 12.5 MG HALF TABLET
12.5000 mg | ORAL_TABLET | Freq: Two times a day (BID) | ORAL | Status: DC
  Administered 2023-07-25: 12.5 mg via ORAL
  Filled 2023-07-25: qty 1

## 2023-07-25 NOTE — ED Provider Notes (Signed)
[place] [facility] Provider Note   CSN: [id number] Arrival date & time: 07/25/23  4098     History  Chief Complaint  Patient presents with   Chest Pain    Kristen Hansen is a 76 y.o. female.  Patient with a history of rheumatoid arthritis, hypertension, GERD, mitral regurgitation, trigeminal neuralgia, osteoarthritis presenting with intermittent chest pain.  She is a poor historian.  Indicates has been having intermittent chest pain for the past 1 week or more.  Sometimes the pain is in the right side of her chest and sometimes is on the left side of her chest.  She describes pain below her left collarbone that radiates to her left shoulder and arm.  Worse with palpation and movement.  Has been having episodes of this pain intermittently for the past 1 week that comes and goes lasting for several minutes at a time.  Worse with exertion.  Today had an episode that has been fairly constant since 8 PM but waxes and wanes in severity.  Pain is keeping her awake so she came in for evaluation.  Does feel short of breath.  Recently diagnosed with pneumonia and completed a course of antibiotics.  No fever.  No runny nose or sore throat.  No abdominal pain, leg pain or leg swelling.  Recent saw cardiologist at [facility] for abnormal stress test and is being considered for a cardiac catheterization.  The history is provided by the patient and the spouse.  Chest Pain Associated symptoms: nausea and shortness of breath   Associated symptoms: no abdominal pain, no dizziness, no fever, no headache, no vomiting and no weakness        Home Medications Prior to Admission medications   Medication Sig Start Date End Date Taking? Authorizing Provider  acetaminophen-codeine (TYLENOL #3) 300-30 MG tablet Take 1-2 tablets by mouth every 6 (six) hours as needed (for pain). 06/25/21   Molpus, John, MD  amLODipine-olmesartan (AZOR) 5-40 MG tablet Take 1 tablet by mouth  every evening.     [provider]  benzonatate (TESSALON) 100 MG capsule Take 1 capsule (100 mg total) by mouth every 8 (eight) hours. 06/13/23   Terrilee Files, MD  clonazePAM (KLONOPIN) 1 MG tablet TAKE 1 TABLET BY MOUTH AT BEDTIME 03/20/23   Sater, Pearletha Furl, MD  Coenzyme Q10 (CO Q 10 PO) Take 1 Dose by mouth daily.    [provider]  Docusate Calcium (STOOL SOFTENER PO) Take 1 tablet by mouth daily.    [provider]  doxycycline (VIBRAMYCIN) 100 MG capsule Take 1 capsule (100 mg total) by mouth 2 (two) times daily. 06/11/23   Curatolo, Adam, DO  ergocalciferol (VITAMIN D2) 1.25 MG (50000 UT) capsule ergocalciferol (vitamin D2) 1,250 mcg (50,000 unit) capsule  TAKE 1 CAPSULE BY MOUTH ONCE A WEEK FOR 12 WEEKS Patient not taking: Reported on 05/31/2023    [provider]  etanercept (ENBREL SURECLICK) 50 MG/ML injection 1 injection 06/08/21   [provider]  famotidine (PEPCID) 40 MG tablet Take 40 mg by mouth daily as needed. 01/25/22   [provider]  lamoTRIgine (LAMICTAL) 150 MG tablet Take 1 tablet by mouth once daily 10/06/22   Sater, Pearletha Furl, MD  loratadine (CLARITIN) 10 MG tablet Take 10 mg by mouth daily.    [provider]  mometasone (NASONEX) 50 MCG/ACT nasal spray Place 1 spray into the nose daily as needed (CONGESTION).     [provider]  montelukast (SINGULAIR) 10 MG tablet Take 10 mg by mouth at bedtime.    [provider]  omeprazole (PRILOSEC) 20 MG capsule Take 20 mg by mouth daily.     [provider]  ondansetron (ZOFRAN ODT) 8 MG disintegrating tablet Take 1 tablet (8 mg total) by mouth every 8 (eight) hours as needed for nausea or vomiting. 06/25/21   Molpus, John, MD  Oxcarbazepine (TRILEPTAL) 300 MG tablet TAKE 1 TABLET BY MOUTH IN THE MORNING AND 1 AT NOON AND 2 AT BEDTIME 03/15/23   Sater, Pearletha Furl, MD  pravastatin (PRAVACHOL) 40 MG tablet Take 40 mg by mouth daily before  breakfast.     [provider]  pyridostigmine (MESTINON) 60 MG tablet TAKE 2 TO 3 TABS A DAY 01/01/23   Sater, Pearletha Furl, MD  sertraline (ZOLOFT) 50 MG tablet Take 1 tablet by mouth once daily 01/22/23   Sater, Pearletha Furl, MD  valACYclovir (VALTREX) 500 MG tablet TAKE 2 TABLETS BY MOUTH EVERY 12 HOURS AS NEEDED FOR FEVER BLISTERS    [provider]      Allergies    Morphine and codeine, Prevacid [lansoprazole], Provigil [modafinil], and Adhesive [tape]    Review of Systems   Review of Systems  Constitutional:  Negative for activity change, appetite change and fever.  HENT:  Negative for congestion and rhinorrhea.   Respiratory:  Positive for chest tightness and shortness of breath.   Cardiovascular:  Positive for chest pain.  Gastrointestinal:  Positive for nausea. Negative for abdominal pain and vomiting.  Genitourinary:  Negative for dysuria and hematuria.  Musculoskeletal:  Negative for arthralgias and myalgias.  Neurological:  Negative for dizziness, weakness and headaches.   all other systems are negative except as noted in the HPI and PMH.    Physical Exam Updated Vital Signs BP (!) 178/83   Pulse (!) 107   Temp 98 F (36.7 C)   Resp (!) 23   Ht 5\' 10"  (1.778 m)   Wt 56.2 kg   SpO2 97%   BMI 17.79 kg/m  Physical Exam Vitals and nursing note reviewed.  Constitutional:      General: She is not in acute distress.    Appearance: She is well-developed.     Comments: Chronically ill-appearing  HENT:     Head: Normocephalic and atraumatic.     Mouth/Throat:     Pharynx: No oropharyngeal exudate.  Eyes:     Conjunctiva/sclera: Conjunctivae normal.     Pupils: Pupils are equal, round, and reactive to light.  Neck:     Comments: No meningismus. Cardiovascular:     Rate and Rhythm: Regular rhythm. Tachycardia present.     Heart sounds: Normal heart sounds. No murmur heard. Pulmonary:     Effort: Pulmonary effort is normal. No respiratory distress.      Breath sounds: Normal breath sounds.  Abdominal:     Palpations: Abdomen is soft.     Tenderness: There is no abdominal tenderness. There is no guarding or rebound.  Musculoskeletal:        General: No tenderness. Normal range of motion.     Cervical back: Normal range of motion and neck supple.     Comments: Pain with range of motion of left shoulder  Skin:    General: Skin is warm.  Neurological:     Mental Status: She is alert and oriented to person, place, and time.     Cranial Nerves: No cranial nerve deficit.  Motor: No abnormal muscle tone.     Coordination: Coordination normal.     Comments:  5/5 strength throughout. CN 2-12 intact.Equal grip strength.   Psychiatric:        Behavior: Behavior normal.     ED Results / Procedures / Treatments   Labs (all labs ordered are listed, but only abnormal results are displayed) Labs Reviewed  CBC WITH DIFFERENTIAL/PLATELET - Abnormal; Notable for the following components:      Result Value   WBC 12.7 (*)    Hemoglobin 10.6 (*)    HCT 33.1 (*)    Neutro Abs 10.8 (*)    All other components within normal limits  COMPREHENSIVE METABOLIC PANEL - Abnormal; Notable for the following components:   Sodium 134 (*)    Glucose, Bld 126 (*)    Creatinine, Ser 1.04 (*)    Calcium 8.8 (*)    Albumin 2.8 (*)    Alkaline Phosphatase 141 (*)    GFR, Estimated 56 (*)    All other components within normal limits  BRAIN NATRIURETIC PEPTIDE - Abnormal; Notable for the following components:   B Natriuretic Peptide 733.2 (*)    All other components within normal limits  D-DIMER, QUANTITATIVE - Abnormal; Notable for the following components:   D-Dimer, Quant 3.49 (*)    All other components within normal limits  TROPONIN I (HIGH SENSITIVITY) - Abnormal; Notable for the following components:   Troponin I (High Sensitivity) 30 (*)    All other components within normal limits  TROPONIN I (HIGH SENSITIVITY) - Abnormal; Notable for the following  components:   Troponin I (High Sensitivity) 34 (*)    All other components within normal limits  PROTIME-INR  LACTATE DEHYDROGENASE    EKG EKG Interpretation Date/Time:  Wednesday July 25 2023 02:59:51 EDT Ventricular Rate:  113 PR Interval:  143 QRS Duration:  87 QT Interval:  316 QTC Calculation: 434 R Axis:   -15  Text Interpretation: Sinus tachycardia Left atrial enlargement Inferior infarct, acute (LCx) Probable anterolateral infarct, old lateral ST elevation Artifact needs repeat Confirmed by Glynn Octave [phone]) on 07/25/2023 3:16:37 AM  Radiology CT Angio Chest PE W and/or Wo Contrast  Result Date: 07/25/2023 CLINICAL DATA:  Pulmonary embolism suspected, low to intermediate probability, positive D-dimer. Chest pain radiating across shoulder and down left arm. Wheezing and shortness of breath. EXAM: CT ANGIOGRAPHY CHEST WITH CONTRAST TECHNIQUE: Multidetector CT imaging of the chest was performed using the standard protocol during bolus administration of intravenous contrast. Multiplanar CT image reconstructions and MIPs were obtained to evaluate the vascular anatomy. RADIATION DOSE REDUCTION: This exam was performed according to the departmental dose-optimization program which includes automated exposure control, adjustment of the mA and/or kV according to patient size and/or use of iterative reconstruction technique. CONTRAST:  75mL OMNIPAQUE IOHEXOL 350 MG/ML SOLN COMPARISON:  12/13/2017. FINDINGS: Cardiovascular: The heart is normal in size and there is a trace pericardial effusion. Few scattered coronary artery calcifications are noted. There is atherosclerotic calcification of the aorta without evidence of aneurysm. The pulmonary trunk is normal in caliber. No evidence of pulmonary embolism. Mediastinum/Nodes: No enlarged mediastinal, hilar, or axillary lymph nodes. Thyroid gland, trachea, and esophagus demonstrate no significant findings. Lungs/Pleura: There is a large right  pleural effusion with compressive atelectasis. No pneumothorax. Upper Abdomen: No acute abnormality. Musculoskeletal: Bilateral breast implants are noted. Cervical spinal fusion hardware is present. There are degenerative changes in the thoracic spine. Review of the MIP images confirms the above findings.  IMPRESSION: 1. No evidence of pulmonary embolism. 2. Large right pleural effusion with compressive atelectasis. 3. Coronary artery calcifications. 4. Aortic atherosclerosis Electronically Signed   By: Thornell Sartorius M.D.   On: 07/25/2023 04:28   DG Chest Portable 1 View  Result Date: 07/25/2023 CLINICAL DATA:  Chest pain. EXAM: PORTABLE CHEST 1 VIEW COMPARISON:  Chest radiograph dated 06/11/2023. FINDINGS: Moderate right pleural effusion, significantly increased since the prior radiograph. There is associated compressive atelectasis of the right lung base versus pneumonia. Interval resolution of the previously seen left pleural effusion. The left lung is clear. No pneumothorax. The cardiac silhouette is within normal limits. No acute osseous pathology. IMPRESSION: Moderate right pleural effusion, increased since the prior radiograph. Electronically Signed   By: Elgie Collard M.D.   On: 07/25/2023 03:57    Procedures .Critical Care  Performed by: Glynn Octave, MD Authorized by: Glynn Octave, MD   Critical care provider statement:    Critical care time (minutes):  32   Critical care time was exclusive of:  Separately billable procedures and treating other patients   Critical care was necessary to treat or prevent imminent or life-threatening deterioration of the following conditions: ACS.   Critical care was time spent personally by me on the following activities:  Development of treatment plan with patient or surrogate, discussions with consultants, evaluation of patient's response to treatment, examination of patient, ordering and review of laboratory studies, ordering and review of  radiographic studies, ordering and performing treatments and interventions, pulse oximetry, re-evaluation of patient's condition, review of old charts, blood draw for specimens and obtaining history from patient or surrogate   I assumed direction of critical care for this patient from another provider in my specialty: no     Care discussed with: admitting provider       Medications Ordered in ED Medications  nitroGLYCERIN (NITROSTAT) SL tablet 0.4 mg (0.4 mg Sublingual Given 07/25/23 0319)  aspirin chewable tablet 324 mg (324 mg Oral Given 07/25/23 1610)    ED Course/ Medical Decision Making/ A&P                                 Medical Decision Making Amount and/or Complexity of Data Reviewed Independent Historian: spouse Labs: ordered. Decision-making details documented in ED Course. Radiology: ordered and independent interpretation performed. Decision-making details documented in ED Course. ECG/medicine tests: ordered and independent interpretation performed. Decision-making details documented in ED Course.  Risk OTC drugs. Prescription drug management. Decision regarding hospitalization.  Intermittent chest pain for the past 1 week but worsening Symptoms since 8pm last night.  Tachycardic on arrival and hypertensive with no history of same.  Initial EKG concerning for ST elevation in leads V5 and V6 as well as lead II and III. Multiple repeat EKGs confirm elevation laterally which is more acute than previous.  Immediately discussed with interventional cardiology on-call Dr. Eldridge Dace  He reviewed patient's EKGs.  He agrees there is concerning ST elevation laterally but morphology is similar to previous in June.  He believes without reciprocal changes would not activate Cath Lab at this time.  Heart rate likely contributing to ST elevation.  Dr. Eldridge Dace wishes to be notified if troponin significantly elevated.  Will give aspirin and nitroglycerin.  Consider alternative sources of  chest pain such as pulmonary embolism.  Initial troponin is 30.  D-dimer elevated at 3.5. Chest x-ray concerning for large right pleural effusion.  Results reviewed and  interpreted by me.   chest pain has improved with nitroglycerin and aspirin.  Blood pressure improved to 171/82.  CT scan is negative for pulmonary embolism.  Does show large right pleural effusion.  Coronary calcifications present. Troponin not significantly elevated.  They are 30 and 34.  Not consistent with STEMI.  Plan admission for further evaluation of chest pain, abnormal EKG and pleural effusion.  Discussed with Dr. Joneen Roach.        Final Clinical Impression(s) / ED Diagnoses Final diagnoses:  Chest pain, unspecified type  Abnormal EKG  Pleural effusion    Rx / DC Orders ED Discharge Orders     None         , Jeannett Senior, MD 07/25/23 [phone]

## 2023-07-25 NOTE — Progress Notes (Signed)
Pt arrived to floor around 1430 from [facility] via stretcher by [facility]. Pt alert and oriented x 4 with confusion noted at times. Pt ambulatory from stretcher to hospital bed with steady gait, standby assist. VSS. Respirations even and unlabored on room air. Pt oriented to room and instructed on use call bell and not to get up unassisted. Call bell within reach. Bed in low position. Husband at bedside.   EMT reported slight change in EKG during transport. EKG completed at bedside and transmitted to pt's chart. Admitting MD made aware. MD stated to get ordered troponin stat. Pt is asymptomatic at this time.

## 2023-07-25 NOTE — ED Notes (Signed)
[facility]ed [facility] for transport talked to [place] at 12:18

## 2023-07-25 NOTE — Progress Notes (Signed)
Overnight event  Notified by RN that patient is tachycardic to 130s but not complaining of chest pain at this time.  Most recent vital signs: Temperature 98.5 F, respiratory rate 20, blood pressure 135/93, SpO2 95% on room air.   Chart review, troponin elevated but has remained flat on labs done today (30> 34> 38> 42> 41). CTA done today negative for PE but showing a large right pleural effusion with compressive atelectasis.    Stat EKG done and reviewed with on-call cardiologist Dr. Mackie Pai. EKG is showing new onset A-fib with RVR.  ST changes appear similar to previous EKG.  Cardiologist recommending starting metoprolol 12.5 mg every 12 hours.  Since patient is supposed to get thoracentesis in the morning, recommending starting anticoagulation postprocedure.

## 2023-07-25 NOTE — ED Notes (Signed)
Second IV attempted without success.

## 2023-07-25 NOTE — ED Triage Notes (Signed)
Presents with 1 week of CP that radiates across the shoulder. Pt also reports around 8pm the CP got worse and pain is radiating down LT arm. Pain worse with movement. Endorses wheezing and SOB. Dx with [facility] around 1 month ago here. No fevers at home.

## 2023-07-25 NOTE — H&P (Signed)
History and Physical   Kyiana Pesola Bramel ION:[id number] DOB: Apr 05, 1946 DOA: 07/25/2023  PCP: Kerin Salen, PA-C   Patient coming from: Home  Chief Complaint: Chest pain  HPI: Sidonie Rosander Dinsmore is a 77 y.o. female with medical history significant of hypertension, hyperlipidemia, Lewy body dementia, Sjogren's syndrome, rheumatoid arthritis, chronic pain, DVT, spine disease, spinal stenosis, diastolic CHF, depression, REM behavior disorder, orthostatic hypotension, CKD 3, GERD, hypothyroidism presenting with chest pain.  Somewhat unreliable historian given history of dementia.  Some history obtained with assistance of chart review and family.  Patient has had about a week or so of intermittent chest pain.  Inconsistent location sometimes on the right sometimes on the left.  More recently has had some pain in the left collarbone radiating to left shoulder worse with palpation or movement.  Has been more persistent day of presentation.  Recently treated for pneumonia and he completed antibiotics.  Follows with [facility] and had stress test earlier this year that had some abnormality on the nuclear test status without corresponding wall motion abnormality on echocardiogram they are deferring catheterization for now.  Patient denies fevers, chills, shortness of breath, abdominal pain, constipation, diarrhea, nausea, vomiting.  ED Course: Vital signs in the ED notable for blood pressure in the 160s to 190 systolic, heart rate in the 90s to 100s, respiratory rate in the teens to 20s.  Lab workup included CMP with sodium 134, creatinine stable 1.4, glucose 126, calcium 8.8, albumin 2.8, alk phos 141.  CBC with leukocytosis to 12.7, hemoglobin stable at 10.6.  PT mildly elevated at 15.4, INR normal.  Troponin mildly uptrending at 30, 34, 38, fourth pending.  BNP 723.  D-dimer 3.49.  LDH normal.  Imaging studies included chest x-ray with moderate right pleural effusion increased from previous.  CT  PE study showed no PE but did show a large right pleural effusion with compressive atelectasis.  Patient received aspirin, home blood pressure medicines, Klonopin, order for IR thoracentesis prior to transfer.  Cardiology was consulted in ED and had low suspicion for MI based on EKG review.  Especially considering flat/mildly elevated troponins and asked to reconsult if further concerns/significant elevation in troponin.  Review of Systems: As per HPI otherwise all other systems reviewed and are negative.  Past Medical History:  Diagnosis Date   Back pain    Cancer (HCC)    Skin, basal cell   Dyspnea    Fatigue    GERD (gastroesophageal reflux disease)    Headache(784.0)    history of migraines   History of kidney stones    Hypercholesteremia    Hypertension    Leg swelling    Bilateral   Occasional tremors    mild hand tremors   Osteoarthritis    PONV (postoperative nausea and vomiting)    RBD (REM behavioral disorder)    Rheumatoid arthritis(714.0)    Scoliosis of lumbar spine    Spinal stenosis    Multiple levels   Tic [place]    Trigeminal neuralgia 03/30/2015   Vision abnormalities     Past Surgical History:  Procedure Laterality Date   ABDOMINAL HYSTERECTOMY     AUGMENTATION MAMMAPLASTY Bilateral    COLONOSCOPY     JOINT REPLACEMENT     right uni knee, right hip with 2 revisions   KIDNEY STONE SURGERY Bilateral    KNEE ARTHROTOMY Right 02/27/2018   Procedure: RIGHT KNEE ARTHROTOMY; scar excision;  Surgeon: Ollen Gross, MD;  Location: [facility];  Service: Orthopedics;  Laterality: Right;   MOHS SURGERY     TONSILLECTOMY     TOTAL HIP ARTHROPLASTY     TOTAL KNEE ARTHROPLASTY  01/22/2013   Procedure: TOTAL KNEE ARTHROPLASTY;  Surgeon: Loanne Drilling, MD;  Location: [facility];  Service: Orthopedics;  Laterality: Right;  Revision of a Right Uni Knee to a Total Knee Arthroplasty    Social History  reports that she has never smoked. She has never used smokeless tobacco.  She reports current alcohol use. She reports that she does not use drugs.  Allergies  Allergen Reactions   Morphine And Codeine Hives   Prevacid [Lansoprazole] Other (See Comments)    unknown   Provigil [Modafinil] Other (See Comments)    dizziness   Adhesive [Tape] Rash    bandaids     Family History  Problem Relation Age of Onset   Heart disease Mother    Parkinson's disease Father   Reviewed on admission  Prior to Admission medications   Medication Sig Start Date End Date Taking? Authorizing Provider  acetaminophen-codeine (TYLENOL #3) 300-30 MG tablet Take 1-2 tablets by mouth every 6 (six) hours as needed (for pain). 06/25/21   Molpus, John, MD  amLODipine-olmesartan (AZOR) 5-40 MG tablet Take 1 tablet by mouth every evening.     [provider]  benzonatate (TESSALON) 100 MG capsule Take 1 capsule (100 mg total) by mouth every 8 (eight) hours. 06/13/23   Terrilee Files, MD  clonazePAM (KLONOPIN) 1 MG tablet TAKE 1 TABLET BY MOUTH AT BEDTIME 03/20/23   Sater, Pearletha Furl, MD  Coenzyme Q10 (CO Q 10 PO) Take 1 Dose by mouth daily.    [provider]  Docusate Calcium (STOOL SOFTENER PO) Take 1 tablet by mouth daily.    [provider]  doxycycline (VIBRAMYCIN) 100 MG capsule Take 1 capsule (100 mg total) by mouth 2 (two) times daily. 06/11/23   Curatolo, Adam, DO  ergocalciferol (VITAMIN D2) 1.25 MG (50000 UT) capsule ergocalciferol (vitamin D2) 1,250 mcg (50,000 unit) capsule  TAKE 1 CAPSULE BY MOUTH ONCE A WEEK FOR 12 WEEKS Patient not taking: Reported on 05/31/2023    [provider]  etanercept (ENBREL SURECLICK) 50 MG/ML injection 1 injection 06/08/21   [provider]  famotidine (PEPCID) 40 MG tablet Take 40 mg by mouth daily as needed. 01/25/22   [provider]  lamoTRIgine (LAMICTAL) 150 MG tablet Take 1 tablet by mouth once daily 10/06/22   Sater, Pearletha Furl, MD  loratadine (CLARITIN) 10 MG tablet Take 10 mg by mouth daily.     [provider]  mometasone (NASONEX) 50 MCG/ACT nasal spray Place 1 spray into the nose daily as needed (CONGESTION).     [provider]  montelukast (SINGULAIR) 10 MG tablet Take 10 mg by mouth at bedtime.    [provider]  omeprazole (PRILOSEC) 20 MG capsule Take 20 mg by mouth daily.     [provider]  ondansetron (ZOFRAN ODT) 8 MG disintegrating tablet Take 1 tablet (8 mg total) by mouth every 8 (eight) hours as needed for nausea or vomiting. 06/25/21   Molpus, John, MD  Oxcarbazepine (TRILEPTAL) 300 MG tablet TAKE 1 TABLET BY MOUTH IN THE MORNING AND 1 AT NOON AND 2 AT BEDTIME 03/15/23   Sater, Pearletha Furl, MD  pravastatin (PRAVACHOL) 40 MG tablet Take 40 mg by mouth daily before breakfast.     [provider]  pyridostigmine (MESTINON) 60 MG tablet TAKE 2 TO 3  TABS A DAY 01/01/23   Sater, Pearletha Furl, MD  sertraline (ZOLOFT) 50 MG tablet Take 1 tablet by mouth once daily 01/22/23   Sater, Pearletha Furl, MD  valACYclovir (VALTREX) 500 MG tablet Take 1,000 mg by mouth every 12 (twelve) hours as needed (for fever blisters).    [provider]    Physical Exam: Vitals:   07/25/23 1300 07/25/23 1315 07/25/23 1330 07/25/23 1446  BP: (!) 166/85 (!) 170/106  (!) 167/83  Pulse: 95 (!) 101 98 94  Resp: 13 (!) 24  19  Temp:    98.4 F (36.9 C)  TempSrc:    Oral  SpO2: 95% 94% 99% 98%  Weight:      Height:        Physical Exam Constitutional:      General: She is not in acute distress.    Appearance: Normal appearance.  HENT:     Head: Normocephalic and atraumatic.     Mouth/Throat:     Mouth: Mucous membranes are moist.     Pharynx: Oropharynx is clear.  Eyes:     Extraocular Movements: Extraocular movements intact.     Pupils: Pupils are equal, round, and reactive to light.  Cardiovascular:     Rate and Rhythm: Normal rate and regular rhythm.     Pulses: Normal pulses.     Heart sounds: Normal heart sounds.  Pulmonary:     Effort:  Pulmonary effort is normal. No respiratory distress.     Breath sounds: Examination of the right-lower field reveals decreased breath sounds. Decreased breath sounds present.  Abdominal:     General: Bowel sounds are normal. There is no distension.     Palpations: Abdomen is soft.     Tenderness: There is no abdominal tenderness.  Musculoskeletal:        General: No swelling or deformity.  Skin:    General: Skin is warm and dry.  Neurological:     General: No focal deficit present.     Mental Status: Mental status is at baseline.    Labs on Admission: I have personally reviewed following labs and imaging studies  CBC: Recent Labs  Lab 07/25/23 0308  WBC 12.7*  NEUTROABS 10.8*  HGB 10.6*  HCT 33.1*  MCV 82.8  PLT 301    Basic Metabolic Panel: Recent Labs  Lab 07/25/23 0308  NA 134*  K 3.9  CL 102  CO2 23  GLUCOSE 126*  BUN 21  CREATININE 1.04*  CALCIUM 8.8*    GFR: Estimated Creatinine Clearance: 40.8 mL/min (A) (by C-G formula based on SCr of 1.04 mg/dL (H)).  Liver Function Tests: Recent Labs  Lab 07/25/23 0308  AST 18  ALT 13  ALKPHOS 141*  BILITOT 0.4  PROT 6.6  ALBUMIN 2.8*    Urine analysis:    Component Value Date/Time   COLORURINE YELLOW 01/16/2013 1429   APPEARANCEUR CLEAR 01/16/2013 1429   LABSPEC 1.017 01/16/2013 1429   PHURINE 6.0 01/16/2013 1429   GLUCOSEU NEGATIVE 01/16/2013 1429   HGBUR NEGATIVE 01/16/2013 1429   BILIRUBINUR NEGATIVE 01/16/2013 1429   KETONESUR NEGATIVE 01/16/2013 1429   PROTEINUR NEGATIVE 01/16/2013 1429   UROBILINOGEN 0.2 01/16/2013 1429   NITRITE NEGATIVE 01/16/2013 1429   LEUKOCYTESUR SMALL (A) 01/16/2013 1429    Radiological Exams on Admission: CT Angio Chest PE W and/or Wo Contrast  Result Date: 07/25/2023 CLINICAL DATA:  Pulmonary embolism suspected, low to intermediate probability, positive D-dimer. Chest pain radiating across shoulder and down left  arm. Wheezing and shortness of breath. EXAM: CT  ANGIOGRAPHY CHEST WITH CONTRAST TECHNIQUE: Multidetector CT imaging of the chest was performed using the standard protocol during bolus administration of intravenous contrast. Multiplanar CT image reconstructions and MIPs were obtained to evaluate the vascular anatomy. RADIATION DOSE REDUCTION: This exam was performed according to the departmental dose-optimization program which includes automated exposure control, adjustment of the mA and/or kV according to patient size and/or use of iterative reconstruction technique. CONTRAST:  75mL OMNIPAQUE IOHEXOL 350 MG/ML SOLN COMPARISON:  12/13/2017. FINDINGS: Cardiovascular: The heart is normal in size and there is a trace pericardial effusion. Few scattered coronary artery calcifications are noted. There is atherosclerotic calcification of the aorta without evidence of aneurysm. The pulmonary trunk is normal in caliber. No evidence of pulmonary embolism. Mediastinum/Nodes: No enlarged mediastinal, hilar, or axillary lymph nodes. Thyroid gland, trachea, and esophagus demonstrate no significant findings. Lungs/Pleura: There is a large right pleural effusion with compressive atelectasis. No pneumothorax. Upper Abdomen: No acute abnormality. Musculoskeletal: Bilateral breast implants are noted. Cervical spinal fusion hardware is present. There are degenerative changes in the thoracic spine. Review of the MIP images confirms the above findings. IMPRESSION: 1. No evidence of pulmonary embolism. 2. Large right pleural effusion with compressive atelectasis. 3. Coronary artery calcifications. 4. Aortic atherosclerosis Electronically Signed   By: Thornell Sartorius M.D.   On: 07/25/2023 04:28   DG Chest Portable 1 View  Result Date: 07/25/2023 CLINICAL DATA:  Chest pain. EXAM: PORTABLE CHEST 1 VIEW COMPARISON:  Chest radiograph dated 06/11/2023. FINDINGS: Moderate right pleural effusion, significantly increased since the prior radiograph. There is associated compressive atelectasis  of the right lung base versus pneumonia. Interval resolution of the previously seen left pleural effusion. The left lung is clear. No pneumothorax. The cardiac silhouette is within normal limits. No acute osseous pathology. IMPRESSION: Moderate right pleural effusion, increased since the prior radiograph. Electronically Signed   By: Elgie Collard M.D.   On: 07/25/2023 03:57    EKG: Independently reviewed.  Sinus tachycardia at 113 bpm.  Baseline wander nonspecific ST changes.  Repeat EKG showed V4 repolarization abnormality with J-point elevation.  Additional repeat EKG appears to show J-point elevation most consistent with repolarization abnormality in V3 different from previous but possibly due to lead placement.  Assessment/Plan Principal Problem:   Pleural effusion Active Problems:   Rheumatoid arthritis (HCC)   HTN (hypertension)   Chronic bilateral low back pain without sciatica   Depression   Lewy body dementia without behavioral disturbance, psychotic disturbance, mood disturbance, or anxiety (HCC)   Spinal stenosis of lumbar region   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)   Chronic heart failure with preserved ejection fraction (HCC)   CKD (chronic kidney disease) stage 3, GFR 30-59 ml/min (HCC)   Gastroesophageal reflux disease with esophagitis   Mixed hyperlipidemia   Chest pain   Sjogren's syndrome (HCC)   Pleural effusion Chest pain > Present with some nonspecific chest pain on alternating sides for the last week or so.  Found to have mildly elevated troponin and some EKG abnormalities more consistent with rope repolarization.  Did have new/different EKG changes on arrival from med center.  Appears to continue to be J-point elevation likely repolarization abnormality but is more significant (could be due to lead placement) will continue to trend troponin for correlation. > Noted to have significant right pleural effusion and was enlarged from previous on  imaging. > Cardiology had been consulted but given lower suspicion for  STEMI given lack of reciprocal changes and low troponin Cath Lab was not activated and they stated to reconsult if further concerns or significantly elevated troponin. > Has had outpatient stress test performed which showed some abnormality but did not have corresponding wall motion abnormality.  Outpatient cardiologist is considering catheterization but delaying for now. > Some symptoms appear to be MSK related.  Some referred pain from pleural effusion possible. > Prior to transfer IR thoracentesis has been ordered. - Continue monitor on telemetry - Trend troponin, consult cardiology if indicated - As needed EKG - Continue with IR thoracentesis  Hypertension - Continue home amlodipine-ARB - Continue home Lasix - Continue home spironolactone - Continue home metoprolol  Hyperlipidemia - Continue home pravastatin  History of DVT - Not currently on anticoagulation per chart review  Diastolic CHF > Last echo in 2023 with EF 65-70% per chart review. - Continue home Lasix, spironolactone, metoprolol, ARB  CKD 3 > Creatinine stable in ED. - Trend renal function and electrolytes  GERD - Continue PPI  Rheumatoid arthritis - On anteroseptal outpatient  Sjogren's syndrome - Attempted to order home cevimeline for dry mouth, but this does not appear to be in our system  Chronic pain Spine disease - Noted  Depression - Continue home sertraline  REM behavior disorder Lewy body dementia > Follows with neurology - Continue home oxcarbazepine, lamotrigine, clonazepam  DVT prophylaxis: Lovenox  Code Status:   Full Family Communication:  Updated at bedside  Disposition Plan:   Patient is from:  Home  Anticipated DC to:  Home  Anticipated DC date:  2 to 3 days  Anticipated DC barriers: None  Consults called:  EDP discussed with cardiology by phone ED.  No formal consult. Admission status:  Inpatient,  telemetry  Severity of Illness: The appropriate patient status for this patient is INPATIENT. Inpatient status is judged to be reasonable and necessary in order to provide the required intensity of service to ensure the patient's safety. The patient's presenting symptoms, physical exam findings, and initial radiographic and laboratory data in the context of their chronic comorbidities is felt to place them at high risk for further clinical deterioration. Furthermore, it is not anticipated that the patient will be medically stable for discharge from the hospital within 2 midnights of admission.   * I certify that at the point of admission it is my clinical judgment that the patient will require inpatient hospital care spanning beyond 2 midnights from the point of admission due to high intensity of service, high risk for further deterioration and high frequency of surveillance required.Synetta Fail MD [facility]  How to contact the [facility] Attending or Consulting provider 7A - 7P or covering provider during after hours 7P -7A, for this patient?   Check the care team in [facility] and look for a) attending/consulting [facility] provider listed and b) the [facility] team listed Log into [email or web address] and use [place]'s universal password to access. If you do not have the password, please contact the hospital operator. Locate the [facility] provider you are looking for under [facility] and [place] to a number that you can be directly reached. If you still have difficulty reaching the provider, please [place] the [facility] (Director on Call) for the Hospitalists listed on amion for assistance.  07/25/2023, 3:15 PM

## 2023-07-25 NOTE — ED Notes (Signed)
First attempt to call report. RN in a patient room.

## 2023-07-26 ENCOUNTER — Inpatient Hospital Stay (HOSPITAL_COMMUNITY): Payer: Medicare PPO

## 2023-07-26 DIAGNOSIS — I5031 Acute diastolic (congestive) heart failure: Secondary | ICD-10-CM

## 2023-07-26 DIAGNOSIS — I110 Hypertensive heart disease with heart failure: Secondary | ICD-10-CM | POA: Diagnosis not present

## 2023-07-26 DIAGNOSIS — M7989 Other specified soft tissue disorders: Secondary | ICD-10-CM | POA: Diagnosis not present

## 2023-07-26 DIAGNOSIS — J90 Pleural effusion, not elsewhere classified: Secondary | ICD-10-CM | POA: Diagnosis not present

## 2023-07-26 DIAGNOSIS — I480 Paroxysmal atrial fibrillation: Secondary | ICD-10-CM | POA: Diagnosis not present

## 2023-07-26 DIAGNOSIS — I5032 Chronic diastolic (congestive) heart failure: Secondary | ICD-10-CM | POA: Diagnosis not present

## 2023-07-26 DIAGNOSIS — I300 Acute nonspecific idiopathic pericarditis: Secondary | ICD-10-CM

## 2023-07-26 HISTORY — PX: IR THORACENTESIS ASP PLEURAL SPACE W/IMG GUIDE: IMG5380

## 2023-07-26 LAB — HEMOGLOBIN A1C
Hgb A1c MFr Bld: 5.2 % (ref 4.8–5.6)
Mean Plasma Glucose: 102.54 mg/dL

## 2023-07-26 LAB — COMPREHENSIVE METABOLIC PANEL WITH GFR
ALT: 13 U/L (ref 0–44)
AST: 13 U/L — ABNORMAL LOW (ref 15–41)
Albumin: 2.3 g/dL — ABNORMAL LOW (ref 3.5–5.0)
Alkaline Phosphatase: 129 U/L — ABNORMAL HIGH (ref 38–126)
Anion gap: 13 (ref 5–15)
BUN: 17 mg/dL (ref 8–23)
CO2: 22 mmol/L (ref 22–32)
Calcium: 8.9 mg/dL (ref 8.9–10.3)
Chloride: 100 mmol/L (ref 98–111)
Creatinine, Ser: 0.84 mg/dL (ref 0.44–1.00)
GFR, Estimated: >60 mL/min
Glucose, Bld: 92 mg/dL (ref 70–99)
Potassium: 3.8 mmol/L (ref 3.5–5.1)
Sodium: 135 mmol/L (ref 135–145)
Total Bilirubin: 0.5 mg/dL (ref 0.3–1.2)
Total Protein: 6 g/dL — ABNORMAL LOW (ref 6.5–8.1)

## 2023-07-26 LAB — ECHOCARDIOGRAM COMPLETE
AR max vel: 2.36 cm2
AV Peak grad: 4.8 mmHg
Ao pk vel: 1.09 m/s
Area-P 1/2: 3.85 cm2
Height: 70 in
MV VTI: 1.12 cm2
S' Lateral: 1.9 cm
Weight: 1984 oz

## 2023-07-26 LAB — BODY FLUID CELL COUNT WITH DIFFERENTIAL
Eos, Fluid: 47 %
Lymphs, Fluid: 24 %
Monocyte-Macrophage-Serous Fluid: 21 % — ABNORMAL LOW (ref 50–90)
Neutrophil Count, Fluid: 7 % (ref 0–25)
Total Nucleated Cell Count, Fluid: 509 cu mm (ref 0–1000)

## 2023-07-26 LAB — GLUCOSE, PLEURAL OR PERITONEAL FLUID: Glucose, Fluid: 91 mg/dL

## 2023-07-26 LAB — LIPID PANEL
Cholesterol: 144 mg/dL (ref 0–200)
HDL: 55 mg/dL (ref >40)
LDL Cholesterol: 74 mg/dL (ref 0–99)
Total CHOL/HDL Ratio: 2.6 RATIO
Triglycerides: 76 mg/dL (ref <150)
VLDL: 15 mg/dL (ref 0–40)

## 2023-07-26 LAB — LACTATE DEHYDROGENASE, PLEURAL OR PERITONEAL FLUID: LD, Fluid: 97 U/L — ABNORMAL HIGH (ref 3–23)

## 2023-07-26 LAB — GRAM STAIN

## 2023-07-26 LAB — MAGNESIUM: Magnesium: 1.8 mg/dL (ref 1.7–2.4)

## 2023-07-26 LAB — T4, FREE: Free T4: 0.98 ng/dL (ref 0.61–1.12)

## 2023-07-26 LAB — BRAIN NATRIURETIC PEPTIDE: B Natriuretic Peptide: 1109 pg/mL — ABNORMAL HIGH (ref 0.0–100.0)

## 2023-07-26 LAB — SEDIMENTATION RATE: Sed Rate: 64 mm/hr — ABNORMAL HIGH (ref 0–22)

## 2023-07-26 LAB — PROCALCITONIN: Procalcitonin: 0.18 ng/mL

## 2023-07-26 LAB — LACTATE DEHYDROGENASE: LDH: 150 U/L (ref 98–192)

## 2023-07-26 LAB — TSH: TSH: <0.01 u[IU]/mL — ABNORMAL LOW (ref 0.350–4.500)

## 2023-07-26 LAB — C-REACTIVE PROTEIN: CRP: 15.6 mg/dL — ABNORMAL HIGH

## 2023-07-26 MED ORDER — IRBESARTAN 300 MG PO TABS
300.0000 mg | ORAL_TABLET | Freq: Every day | ORAL | Status: DC
  Administered 2023-07-26 – 2023-07-27 (×2): 300 mg via ORAL
  Filled 2023-07-26 (×2): qty 1

## 2023-07-26 MED ORDER — AMLODIPINE BESYLATE 10 MG PO TABS
10.0000 mg | ORAL_TABLET | Freq: Every day | ORAL | Status: DC
Start: 2023-07-26 — End: 2023-07-26

## 2023-07-26 MED ORDER — METOPROLOL TARTRATE 12.5 MG HALF TABLET: 12.5000 mg | ORAL_TABLET | Freq: Two times a day (BID) | ORAL | Status: DC

## 2023-07-26 MED ORDER — COLCHICINE 0.6 MG PO TABS
0.6000 mg | ORAL_TABLET | Freq: Two times a day (BID) | ORAL | Status: DC
  Administered 2023-07-26 – 2023-07-30 (×10): 0.6 mg via ORAL
  Filled 2023-07-26 (×11): qty 1

## 2023-07-26 MED ORDER — ASPIRIN 81 MG PO CHEW
81.0000 mg | CHEWABLE_TABLET | Freq: Every day | ORAL | Status: DC
  Administered 2023-07-26 – 2023-08-03 (×9): 81 mg via ORAL
  Filled 2023-07-26 (×10): qty 1

## 2023-07-26 MED ORDER — LIDOCAINE HCL 1 % IJ SOLN
20.0000 mL | Freq: Once | INTRAMUSCULAR | Status: AC
  Administered 2023-07-26: 10 mL via INTRADERMAL
  Filled 2023-07-26: qty 20

## 2023-07-26 MED ORDER — VALACYCLOVIR HCL 500 MG PO TABS: 1000.0000 mg | ORAL_TABLET | Freq: Two times a day (BID) | ORAL | Status: DC | PRN

## 2023-07-26 MED ORDER — SPIRONOLACTONE 25 MG PO TABS
25.0000 mg | ORAL_TABLET | Freq: Every day | ORAL | Status: DC
  Administered 2023-07-26 – 2023-07-27 (×2): 25 mg via ORAL
  Filled 2023-07-26 (×2): qty 1

## 2023-07-26 MED ORDER — AMLODIPINE BESYLATE 5 MG PO TABS
5.0000 mg | ORAL_TABLET | Freq: Every day | ORAL | Status: DC
  Administered 2023-07-26 – 2023-07-27 (×2): 5 mg via ORAL
  Filled 2023-07-26 (×2): qty 1

## 2023-07-26 MED ORDER — METOPROLOL TARTRATE 25 MG PO TABS
25.0000 mg | ORAL_TABLET | Freq: Two times a day (BID) | ORAL | Status: DC
  Administered 2023-07-26 – 2023-08-08 (×24): 25 mg via ORAL
  Filled 2023-07-26 (×26): qty 1

## 2023-07-26 MED ORDER — CARVEDILOL 3.125 MG PO TABS: 3.1250 mg | ORAL_TABLET | Freq: Two times a day (BID) | ORAL | Status: DC

## 2023-07-26 MED ORDER — METHIMAZOLE 10 MG PO TABS
10.0000 mg | ORAL_TABLET | Freq: Every day | ORAL | Status: DC
  Administered 2023-07-26 – 2023-08-08 (×14): 10 mg via ORAL
  Filled 2023-07-26 (×2): qty 1
  Filled 2023-07-26: qty 2
  Filled 2023-07-26 (×2): qty 1
  Filled 2023-07-26: qty 2
  Filled 2023-07-26: qty 1
  Filled 2023-07-26 (×3): qty 2
  Filled 2023-07-26 (×4): qty 1

## 2023-07-26 MED ORDER — AMIODARONE HCL 200 MG PO TABS
200.0000 mg | ORAL_TABLET | Freq: Every day | ORAL | Status: DC
  Administered 2023-07-26 – 2023-07-27 (×2): 200 mg via ORAL
  Filled 2023-07-26 (×2): qty 1

## 2023-07-26 MED ORDER — LAMOTRIGINE 100 MG PO TABS
150.0000 mg | ORAL_TABLET | Freq: Every day | ORAL | Status: DC
  Administered 2023-07-26 – 2023-08-08 (×14): 150 mg via ORAL
  Filled 2023-07-26 (×14): qty 2

## 2023-07-26 MED ORDER — DILTIAZEM HCL 25 MG/5ML IV SOLN: 10.0000 mg | Freq: Four times a day (QID) | INTRAVENOUS | Status: DC | PRN

## 2023-07-26 MED ORDER — METOPROLOL TARTRATE 100 MG PO TABS
100.0000 mg | ORAL_TABLET | Freq: Two times a day (BID) | ORAL | Status: DC
  Administered 2023-07-26: 100 mg via ORAL
  Filled 2023-07-26: qty 1

## 2023-07-26 MED ORDER — MONTELUKAST SODIUM 10 MG PO TABS
10.0000 mg | ORAL_TABLET | Freq: Every day | ORAL | Status: DC
  Administered 2023-07-26 – 2023-08-07 (×13): 10 mg via ORAL
  Filled 2023-07-26 (×13): qty 1

## 2023-07-26 MED ORDER — LIDOCAINE HCL 1 % IJ SOLN
INTRAMUSCULAR | Status: AC
  Filled 2023-07-26: qty 20

## 2023-07-26 MED ORDER — FUROSEMIDE 20 MG PO TABS
20.0000 mg | ORAL_TABLET | Freq: Every day | ORAL | Status: DC
  Administered 2023-07-26 – 2023-07-27 (×2): 20 mg via ORAL
  Filled 2023-07-26 (×2): qty 1

## 2023-07-26 NOTE — Progress Notes (Signed)
PT Cancellation Note  Patient Details Name: NICHOLLE COLDREN MRN: [id number] DOB: 07-16-1946   Cancelled Treatment:    Reason Eval/Treat Not Completed: Fatigue/lethargy limiting ability to participate  Second and third attempts to see pt this afternoon: 1) undergoing procedure, 2) refused due to extreme fatigue.   Jerolyn Center, PT Acute Rehabilitation Services  Office [phone]  [name] 07/26/2023, 3:02 PM

## 2023-07-26 NOTE — Progress Notes (Signed)
Overview: Kristen Hansen is a 77 y.o. female with medical history significant of hypertension, hyperlipidemia, Lewy body dementia, Sjogren's syndrome, rheumatoid arthritis, chronic pain, DVT, spine disease, spinal stenosis, diastolic CHF, depression, REM behavior disorder, orthostatic hypotension, CKD 3, GERD, hypothyroidism presenting with chest pain. Her chest pain has atypical nature and present for 2 weeks intermittently    Subjective:  Overnight:NAEON  States doing well this am. CP is better. Getting hx of her chest pain, states worse with exertion and some times with deep breathing. Pt typically has shortness of breathing preceding the chest pain and states the it is more right sided than left.   Objective: Afebrile, normotensive, satting well on RA.   Vital signs in last 24 hours: Vitals:   07/25/23 2200 07/26/23 0000 07/26/23 0304 07/26/23 0810  BP: (!) 147/78 (!) 141/90 132/80 (!) 152/90  Pulse: (!) 120 (!) 108 100 79  Resp: (!) 24 19 20 20   Temp: 98.3 F (36.8 C) 98 F (36.7 C) 98.3 F (36.8 C) 98.7 F (37.1 C)  TempSrc: Oral Oral Oral Oral  SpO2: 97% 91% 99% 95%  Weight:      Height:       Supplemental O2: Room Air Last BM Date : 07/24/23 SpO2: 95 % Filed Weights   07/25/23 0251  Weight: 56.2 kg    Intake/Output Summary (Last 24 hours) at 07/26/2023 1914 Last data filed at 07/26/2023 0600 Gross per 24 hour  Intake --  Output 300 ml  Net -300 ml   Net IO Since Admission: -300 mL [07/26/23 0929]  Physical Exam General: NAD, laying comfortably in bed HENT: NCAT Lungs:  CTAB, decreased lung sounds in right base Cardiovascular: sinus tachycardia, good pulses Abdomen: No TTP, normal bowel sounds MSK:  no asymmetry, left shoulder with no TTP, has pain with active range of motion but normal passive range of motion Skin: no lesions on skin Neuro: alert and oriented, initially somnolent Psych: normal mood and normal affect  Diagnostics Troponin  30>34>38>42>41 D-dimer elevated but CTA negative BNP elevated 733  Assessment/Plan: Principal Problem:   Pleural effusion Active Problems:   Rheumatoid arthritis (HCC)   HTN (hypertension)   Chronic bilateral low back pain without sciatica   Depression   Lewy body dementia without behavioral disturbance, psychotic disturbance, mood disturbance, or anxiety (HCC)   Spinal stenosis of lumbar region   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)   Chronic heart failure with preserved ejection fraction (HCC)   CKD (chronic kidney disease) stage 3, GFR 30-59 ml/min (HCC)   Gastroesophageal reflux disease with esophagitis   Mixed hyperlipidemia   Chest pain   Sjogren's syndrome (HCC)  Chest Pain Pleural Effusion HFpEF HLD Pt appears euvolemic on exam. She does have moderate pleural effusion which is increasing in size. This may be contributing to her chest pain as her pain is right sided but states she does have left sided pain as well. Given she has cardiac history and hx of dyspnea on exertion and reporting exertional chest pain, will consult cardiology. From review of her records, it appears she has a [facility] at [facility] and was planning on stress test. Pt on pravastatin 40 mg and was started on metoprolol 100 mg by cardiology.   Hx of DVT: D dimer elevated,  CTA negative, will get lower extremity doppler.   Hyperthyroidism: Recently started on methimazole. States she is complaint with this. Will continue here. Rechecking FT4 and FT3 given episode of RVR.  Left Shoulder Pain: Has good PROM but pain with active ROM. Concern for RT injury. Will get MRI.   Chronic Problems HTN: Will continue home meds.  GERD: Continue home med protonix. Does not endorse symptoms of reflux.  Sjogren's Syndrome: Not on any pharmacotherapy.  RA: Was on enbrel. Last used 2 months ago. Will need rheumatology follow up OP to restart this if needed.  Chronic back pain: Tylenol prn MDD:  Zoloft 50 mg every day. Continued here.  Lewy Body Dementia: On pyridostigmine, Will continue here. Last saw neurologist one month ago.   Diet: Heart Healthy IVF: None VTE: Enoxaparin Code: Full PT/OT recs: Pending. Prior to Admission Living Arrangement: Home Anticipated Discharge Location: Home Barriers to Discharge: Medical Stability Dispo: Anticipated discharge in approximately 2 day(s).   Gwenevere Abbot, MD [facility] Internal [facility] Residency, PGY-3  Pager: [phone] After 5 pm and on weekends: Please call the on-call pager

## 2023-07-26 NOTE — Plan of Care (Signed)
  Problem: Education: Goal: Knowledge of General Education information will improve Description: Including pain rating scale, medication(s)/side effects and non-pharmacologic comfort measures Outcome: Progressing   Problem: Clinical Measurements: Goal: Diagnostic test results will improve Outcome: Progressing   Problem: Clinical Measurements: Goal: Respiratory complications will improve Outcome: Progressing   Problem: Nutrition: Goal: Adequate nutrition will be maintained Outcome: Progressing   Problem: Coping: Goal: Level of anxiety will decrease Outcome: Progressing

## 2023-07-26 NOTE — Consult Note (Addendum)
Cardiology Consultation   Patient ID: Kristen Hansen MRN: [id number]; DOB: Aug 14, 1946  Admit date: 07/25/2023 Date of Consult: 07/26/2023  PCP:  Kerin Salen, PA-C   [facility] Providers Cardiologist:  [facility]   Patient Profile:   Kristen Hansen is a 77 y.o. female with a history of hypertension, hyperlipidemia, hyperthyroidism, prior DVT, rheumatoid arthritis, spinal stenosis, Sjogren's syndrome, Lewy Body dementia, and depression who is being seen 07/26/2023 for the evaluation of chest pain at the request of Dr. Thedore Mins.  History of Present Illness:   [name] is a 77 year old female with the above history who follows with cardiology at [facility].  She has a history of chronic HFpEF.  Last echo in 10/2022 showed LVEF of 65-70% with mild MR/moderate MS, mild TR, and moderate pulmonary hypertension. She has a history of dyspnea and chest pain on exertion for which a nuclear stress test was done in 12/2022. This showed a partially redistributing anterior wall defect without associated wall motion abnormality compatible with ischemia. Cardiac catheterization was discussed at that time but patient preferred to proceed with medical management. She was seen in the ED in 05/2022 for acute cough for 2 weeks and started on antibiotics for possible pneumonia. She was last see by Cardiology on 06/29/2023 at which time she continued to report dyspnea with exertion but denied any recurrent exertional chest pain. She did report chest pain with cough. Cardiac catheterization was again discussed but patient wanted to hold off on this.  Patient presented to the ED on 07/25/2023 for intermittent chest pain. Initial EKG showed sinus tachycardia with some underlying artifical but slight ST elevation in leads V5-V6. Repeat EKG shows some persistent ST elevation as well as PR depression more suggestive of pericarditis. High-sensitivity troponin 30 >> 34 >> 38 >> 42 >>  41. BNP 733 >> 1,109. D-dimer elevated at 3.49. Chest x-ray showed moderate right pleural effusion. Chest CTA was negative for PE but showed a large right pleural effusion with compressive atelectasis. WBC 12.7, Hgb 10.6, Plts 301. Na 134, K 3.9, Glucose 126, BUN 21, Cr 1.04. Albumin 2.8. Alk Phos 141 but otherwise LFT normal. CRP 15.6. Patient was admitted to Internal Medicine service and Cardiology was consulted for further evaluation.  At the time of this evaluation, patient had just gotten back from a thoracentesis. She is somewhat of a difficult historian and her husband assists in the history. She has chronic dyspnea on exertion that has been bother her for at least the past year. However, this worsened about 1-2 weeks ago and she was having shortness of breath with minimal activity such as walking to the bathroom. She also reports some dyspnea at rest recently but denies any orthopnea or PND. She reports lower extremity edema when her legs are down for long periods of times. However, this improves with elevation of legs and low dose of Lasix. She also reports some intermittent chest heaviness/ tightness over the last 1-2 weeks. Pain is alternates from right upper chest to left upper chest and will occasionally radiate across her whole chest. She states this is worse with exertion but also states it is worse when coughing. She is currently chest pain free. She has had a wet sounding cough since coming back from her thoracentesis; however, her husband states she was not having this at home. Husband states the hacking cough that she was having back in 05/2023 resolved after about 1 week of antibiotics. She denies any  fevers.  Upon review of telemetry, it looks like she had about a 12 hour episode of atrial fibrillation last overnight from around 6pm on 07/25/2023 to 4:00am this morning. She denies any palpitations with this. Husband does report some lightheadedness/ dizziness with standing recently but no  syncope. She has never been diagnosed with an arrhythmia before. She denies any history of abnormal bleeding including prior GI bleed or brain bleed.   Past Medical History:  Diagnosis Date   Back pain    Cancer (HCC)    Skin, basal cell   Dyspnea    Fatigue    GERD (gastroesophageal reflux disease)    Headache(784.0)    history of migraines   History of kidney stones    Hypercholesteremia    Hypertension    Leg swelling    Bilateral   Occasional tremors    mild hand tremors   Osteoarthritis    PONV (postoperative nausea and vomiting)    RBD (REM behavioral disorder)    Rheumatoid arthritis(714.0)    Scoliosis of lumbar spine    Spinal stenosis    Multiple levels   Tic douloureux    Trigeminal neuralgia 03/30/2015   Vision abnormalities     Past Surgical History:  Procedure Laterality Date   ABDOMINAL HYSTERECTOMY     AUGMENTATION MAMMAPLASTY Bilateral    COLONOSCOPY     JOINT REPLACEMENT     right uni knee, right hip with 2 revisions   KIDNEY STONE SURGERY Bilateral    KNEE ARTHROTOMY Right 02/27/2018   Procedure: RIGHT KNEE ARTHROTOMY; scar excision;  Surgeon: Ollen Gross, MD;  Location: [facility];  Service: Orthopedics;  Laterality: Right;   MOHS SURGERY     TONSILLECTOMY     TOTAL HIP ARTHROPLASTY     TOTAL KNEE ARTHROPLASTY  01/22/2013   Procedure: TOTAL KNEE ARTHROPLASTY;  Surgeon: Loanne Drilling, MD;  Location: [facility];  Service: Orthopedics;  Laterality: Right;  Revision of a Right Uni Knee to a Total Knee Arthroplasty     Home Medications:  Prior to Admission medications   Medication Sig Start Date End Date Taking? Authorizing Provider  acetaminophen-codeine (TYLENOL #3) 300-30 MG tablet Take 1-2 tablets by mouth every 6 (six) hours as needed (for pain). 06/25/21  Yes Molpus, John, MD  amLODipine-olmesartan (AZOR) 5-40 MG tablet Take 1 tablet by mouth every evening.    Yes [provider]  clonazePAM (KLONOPIN) 1 MG tablet TAKE 1 TABLET BY MOUTH AT  BEDTIME Patient taking differently: Take 1 mg by mouth at bedtime. 03/20/23  Yes Sater, Pearletha Furl, MD  Coenzyme Q10 (CO Q 10 PO) Take 1 Dose by mouth daily.   Yes [provider]  Docusate Calcium (STOOL SOFTENER PO) Take 1 tablet by mouth daily.   Yes [provider]  famotidine (PEPCID) 40 MG tablet Take 40 mg by mouth daily as needed for heartburn. 01/25/22  Yes [provider]  loratadine (CLARITIN) 10 MG tablet Take 10 mg by mouth daily.   Yes [provider]  mometasone (NASONEX) 50 MCG/ACT nasal spray Place 1 spray into the nose daily as needed (CONGESTION).    Yes [provider]  montelukast (SINGULAIR) 10 MG tablet Take 10 mg by mouth at bedtime.   Yes [provider]  omeprazole (PRILOSEC) 20 MG capsule Take 20 mg by mouth daily.    Yes [provider]  ondansetron (ZOFRAN ODT) 8 MG disintegrating tablet Take 1 tablet (8 mg total) by mouth every  8 (eight) hours as needed for nausea or vomiting. 06/25/21  Yes Molpus, John, MD  Oxcarbazepine (TRILEPTAL) 300 MG tablet TAKE 1 TABLET BY MOUTH IN THE MORNING AND 1 AT NOON AND 2 AT BEDTIME Patient taking differently: Take 300 mg by mouth daily. 03/15/23  Yes Sater, Pearletha Furl, MD  pravastatin (PRAVACHOL) 40 MG tablet Take 40 mg by mouth daily before breakfast.    Yes [provider]  sertraline (ZOLOFT) 50 MG tablet Take 1 tablet by mouth once daily 01/22/23  Yes Sater, Pearletha Furl, MD  valACYclovir (VALTREX) 500 MG tablet Take 1,000 mg by mouth every 12 (twelve) hours as needed (for fever blisters).   Yes [provider]  benzonatate (TESSALON) 100 MG capsule Take 1 capsule (100 mg total) by mouth every 8 (eight) hours. Patient not taking: Reported on 07/25/2023 06/13/23   Terrilee Files, MD  doxycycline (VIBRAMYCIN) 100 MG capsule Take 1 capsule (100 mg total) by mouth 2 (two) times daily. Patient not taking: Reported on 07/25/2023 06/11/23   Virgina Norfolk, DO   ergocalciferol (VITAMIN D2) 1.25 MG (50000 UT) capsule ergocalciferol (vitamin D2) 1,250 mcg (50,000 unit) capsule  TAKE 1 CAPSULE BY MOUTH ONCE A WEEK FOR 12 WEEKS Patient not taking: Reported on 05/31/2023    [provider]  etanercept (ENBREL SURECLICK) 50 MG/ML injection 1 injection Patient not taking: Reported on 07/25/2023 06/08/21   [provider]  lamoTRIgine (LAMICTAL) 150 MG tablet Take 1 tablet by mouth once daily Patient not taking: Reported on 07/25/2023 10/06/22   Asa Lente, MD  pyridostigmine (MESTINON) 60 MG tablet TAKE 2 TO 3 TABS A DAY Patient not taking: Reported on 07/25/2023 01/01/23   Asa Lente, MD    Inpatient Medications: Scheduled Meds:  amLODipine  5 mg Oral Daily   aspirin  81 mg Oral Daily   clonazePAM  1 mg Oral QHS   enoxaparin (LOVENOX) injection  40 mg Subcutaneous Q24H   irbesartan  300 mg Oral Daily   lamoTRIgine  150 mg Oral Daily   methIMAzole  10 mg Oral Daily   metoprolol tartrate  12.5 mg Oral BID   montelukast  10 mg Oral QHS   Oxcarbazepine  300 mg Oral BID   pantoprazole  40 mg Oral Daily   pravastatin  40 mg Oral QAC breakfast   pyridostigmine  60 mg Oral Q8H   sertraline  50 mg Oral Daily   sodium chloride flush  3 mL Intravenous Q12H   spironolactone  25 mg Oral Daily   Continuous Infusions:  PRN Meds: acetaminophen **OR** acetaminophen, diltiazem, nitroGLYCERIN, polyethylene glycol, valACYclovir  Allergies:    Allergies  Allergen Reactions   Morphine And Codeine Hives   Prevacid [Lansoprazole] Other (See Comments)    unknown   Provigil [Modafinil] Other (See Comments)    dizziness   Adhesive [Tape] Rash    bandaids     Social History:   Social History   Socioeconomic History   Marital status: Married    Spouse name: Not on file   Number of children: Not on file   Years of education: Not on file   Highest education level: Not on file  Occupational History   Not on file  Tobacco Use    Smoking status: Never   Smokeless tobacco: Never  Vaping Use   Vaping status: Never Used  Substance and Sexual Activity   Alcohol use: Yes    Alcohol/week: 0.0 standard drinks of alcohol  Comment: occasional   Drug use: No   Sexual activity: Not on file  Other Topics Concern   Not on file  Social History Narrative   Lives with her Husband    Caffeine use: 1 Cup of Tea   Social Determinants of Health   Financial Resource Strain: Low Risk  (08/22/2022)   [facility], [facility] prior to 02/24/2023., [facility] visits prior to 02/24/2023.   Overall Financial Resource Strain (CARDIA)    Difficulty of Paying Living Expenses: Not very hard  Food Insecurity: No Food Insecurity (07/25/2023)   Hunger Vital Sign    Worried About Running Out of Food in the Last Year: Never true    Ran Out of Food in the Last Year: Never true  Transportation Needs: No Transportation Needs (07/25/2023)   PRAPARE - Administrator, Civil Service (Medical): No    Lack of Transportation (Non-Medical): No  Physical Activity: Inactive (08/22/2022)   [facility], [facility] prior to 02/24/2023., [facility] visits prior to 02/24/2023.   Exercise Vital Sign    Days of Exercise per Week: 0 days    Minutes of Exercise per Session: 0 min  Stress: No Stress Concern Present (08/22/2022)   [facility] prior to 02/24/2023., [facility] visits prior to 02/24/2023.   [employer] of Occupational Health - Occupational Stress Questionnaire    Feeling of Stress : Only a little  Social Connections: Socially Integrated (08/22/2022)   [facility], [facility] prior to 02/24/2023., [facility]  visits prior to 02/24/2023.   Social Connection and Isolation Panel [NHANES]    Frequency of Communication with Friends and Family: Three times a week    Frequency of Social Gatherings with Friends and Family: Twice a week    Attends Religious Services: More than 4 times per year    Active Member of [employer] or Organizations: Yes    Attends Engineer, structural: More than 4 times per year    Marital Status: Married  Catering manager Violence: Not At Risk (07/25/2023)   Humiliation, Afraid, Rape, and Kick questionnaire    Fear of Current or Ex-Partner: No    Emotionally Abused: No    Physically Abused: No    Sexually Abused: No    Family History:   Family History  Problem Relation Age of Onset   Heart disease Mother    Parkinson's disease Father      ROS:  Please see the history of present illness.  Review of Systems  Constitutional:  Negative for fever.  HENT:  Congestion: postnasal drip.   Respiratory:  Positive for cough, sputum production and shortness of breath.   Cardiovascular:  Positive for chest pain and leg swelling. Negative for palpitations, orthopnea and PND.  Gastrointestinal:  Negative for blood in stool.  Genitourinary:  Negative for hematuria.  Musculoskeletal:  Negative for falls and myalgias.  Neurological:  Positive for dizziness. Negative for loss of consciousness. Weakness: .cgexam. Endo/Heme/Allergies:  Does not bruise/bleed easily.  Psychiatric/Behavioral:  Negative for substance abuse.     Physical Exam/Data:   Vitals:   07/26/23 0810 07/26/23 1200 07/26/23 1215 07/26/23 1257  BP: (!) 152/90 134/76 119/81 (!) 132/98  Pulse: 79   75  Resp: 20   (!) 22  Temp: 98.7 F (37.1 C)   98.2 F (36.8 C)  TempSrc: Oral   Oral  SpO2: 95%   97%  Weight:      Height:        Intake/Output Summary (Last 24 hours) at 07/26/2023 1331 Last data filed at 07/26/2023 0600 Gross per 24 hour  Intake --  Output 300 ml  Net -300 ml      07/25/2023    2:51 AM  06/11/2023    5:35 PM 11/29/2022   11:01 AM  Last 3 Weights  Weight (lbs) 124 lb 136 lb 136 lb  Weight (kg) 56.246 kg 61.689 kg 61.689 kg     Body mass index is 17.79 kg/m.  General: 77 y.o. thin Caucasian female resting comfortably in no acute distress. HEENT: Normocephalic and atraumatic. Sclera clear.  Neck: Supple. No JVD. Heart: RRR. Distinct S1 and S2. No murmurs, gallops, or rubs.  Lungs: No increased work of breathing. Decreased breath sounds in left base.  Abdomen: Soft, non-distended, and non-tender to palpation.  Extremities: No lower extremity edema.    Skin: Warm and dry. Neuro: Alert and oriented x3. No focal deficits. Psych: Normal affect. Responds appropriately.   EKG:  The EKG was personally reviewed and demonstrates:  Initial EKG showed sinus tachycardia with some underlying artifical but slight ST elevation in leads V5-V6. Repeat EKG shows some persistent ST elevation as well as PR depression more suggestive of pericarditis. Telemetry:  Telemetry was personally reviewed and demonstrates:  Currently in normal sinus rhythm with rates in the 90s. However, did have a 12 hour episode of atrial fibrillation overnight with rates in the 100s to 120s.  Relevant CV Studies:  Echocardiogram 10/31/2022 ([facility]): Summary: Left ventricular systolic function is normal.  LV ejection fraction = 65-70%.  Aortic valve calcification  There is no aortic stenosis.  There is moderate mitral annular calcification.  Diffuse thickening of the mitral leaftlet with restricted leaflet opening.  There is moderate mitral stenosis.  There is mild mitral regurgitation.  There is mild tricuspid regurgitation.  Estimated right ventricular systolic pressure is 44 mmHg.  Moderate pulmonary hypertension.  There is no comparison study available.  _______________  Myoview 01/18/2023 ([facility]): Impressions: Partially redistributing anterior wall defect without  associated wall  motion abnormality compatible with ischemia over tissue attenuation.   Normal left ventricular function with ejection fraction calculated at 86 %    Laboratory Data:  High Sensitivity Troponin:   Recent Labs  Lab 07/25/23 0308 07/25/23 0443 07/25/23 0958 07/25/23 1533 07/25/23 1745  TROPONINIHS 30* 34* 38* 42* 41*     Chemistry Recent Labs  Lab 07/25/23 0308 07/26/23 0559 07/26/23 0600  NA 134* 135  --   K 3.9 3.8  --   CL 102 100  --   CO2 23 22  --   GLUCOSE 126* 92  --   BUN 21 17  --   CREATININE 1.04* 0.84  --   CALCIUM 8.8* 8.9  --   MG  --   --  1.8  GFRNONAA 56* >60  --   ANIONGAP 9 13  --     Recent Labs  Lab 07/25/23 0308 07/26/23 0559  PROT 6.6 6.0*  ALBUMIN 2.8* 2.3*  AST 18 13*  ALT 13 13  ALKPHOS 141* 129*  BILITOT 0.4 0.5   Lipids  Recent Labs  Lab 07/26/23 0600  CHOL 144  TRIG 76  HDL 55  LDLCALC 74  CHOLHDL 2.6    Hematology Recent Labs  Lab 07/25/23 0308 07/26/23 0559  WBC 12.7* 9.4  RBC 4.00 3.93  HGB 10.6* 10.5*  HCT 33.1* 32.8*  MCV 82.8 83.5  MCH 26.5 26.7  MCHC 32.0 32.0  RDW 14.4 14.5  PLT 301 283   Thyroid  Recent Labs  Lab 07/26/23 0559 07/26/23 0600  TSH  --  <0.010*  FREET4 0.98  --     BNP Recent Labs  Lab 07/25/23 0308 07/26/23 0600  BNP 733.2* 1,109.0*    DDimer  Recent Labs  Lab 07/25/23 0308  DDIMER 3.49*     Radiology/Studies:  DG Chest 1 View  Result Date: 07/26/2023 CLINICAL DATA:  Status post thoracentesis EXAM: CHEST  1 VIEW COMPARISON:  07/25/2023 FINDINGS: Diminished fluid volume of a loculated lateral and basal right hydropneumothorax, with moderate, layering persistent fluid volume and associated pleural thickening. Left lung is normally aerated. Heart and mediastinum are normal. IMPRESSION: Diminished fluid volume of a loculated lateral and basal right hydropneumothorax, with moderate, layering persistent fluid volume and associated pleural thickening.  Electronically Signed   By: Jearld Lesch M.D.   On: 07/26/2023 13:10   CT Angio Chest PE W and/or Wo Contrast  Result Date: 07/25/2023 CLINICAL DATA:  Pulmonary embolism suspected, low to intermediate probability, positive D-dimer. Chest pain radiating across shoulder and down left arm. Wheezing and shortness of breath. EXAM: CT ANGIOGRAPHY CHEST WITH CONTRAST TECHNIQUE: Multidetector CT imaging of the chest was performed using the standard protocol during bolus administration of intravenous contrast. Multiplanar CT image reconstructions and MIPs were obtained to evaluate the vascular anatomy. RADIATION DOSE REDUCTION: This exam was performed according to the departmental dose-optimization program which includes automated exposure control, adjustment of the mA and/or kV according to patient size and/or use of iterative reconstruction technique. CONTRAST:  75mL OMNIPAQUE IOHEXOL 350 MG/ML SOLN COMPARISON:  12/13/2017. FINDINGS: Cardiovascular: The heart is normal in size and there is a trace pericardial effusion. Few scattered coronary artery calcifications are noted. There is atherosclerotic calcification of the aorta without evidence of aneurysm. The pulmonary trunk is normal in caliber. No evidence of pulmonary embolism. Mediastinum/Nodes: No enlarged mediastinal, hilar, or axillary lymph nodes. Thyroid gland, trachea, and esophagus demonstrate no significant findings. Lungs/Pleura: There is a large right pleural effusion with compressive atelectasis. No pneumothorax. Upper Abdomen: No acute abnormality. Musculoskeletal: Bilateral breast implants are noted. Cervical spinal fusion hardware is present. There are degenerative changes in the thoracic spine. Review of the MIP images confirms the above findings. IMPRESSION: 1. No evidence of pulmonary embolism. 2. Large right pleural effusion with compressive atelectasis. 3. Coronary artery calcifications. 4. Aortic atherosclerosis Electronically Signed   By: Thornell Sartorius M.D.   On: 07/25/2023 04:28   DG Chest Portable 1 View  Result Date: 07/25/2023 CLINICAL DATA:  Chest pain. EXAM: PORTABLE CHEST 1 VIEW COMPARISON:  Chest radiograph dated 06/11/2023. FINDINGS: Moderate right pleural effusion, significantly increased since the prior radiograph. There is associated compressive atelectasis of the right lung base versus pneumonia. Interval resolution of the previously seen left pleural effusion. The left lung is clear. No pneumothorax. The cardiac silhouette is within normal limits. No acute osseous pathology. IMPRESSION: Moderate right pleural effusion, increased since the prior radiograph. Electronically Signed   By: Elgie Collard M.D.   On: 07/25/2023 03:57     Assessment and Plan:   Chest Pain Patient has a history of chest pain and dyspnea on exertion.  Myoview in 12/2022 was abnormal with a perfusion defect of the anterior wall consistent with ischemia. Cardiac catheterization was discussed but patient declined and preferred medical therapy. Patient now presents with progressive dyspnea on exertion as well as chest heaviness/ tightness that is worse with exertion but also worse when coughing. EKG showed lateral and inferior ST elevations and some PR depressions more suggestive of pericarditis. High-sensitivity troponin minimally elevated and flat at  30 >> 34 >> 38 >> 42 >> 41 not consistent with ACS. CRP elevated at 15. D-dimer elevated. CTA negative for PE but did show a large right pleural effusion. Echo pending. Chest pain has some typical and atypical features. Given significantly elevated CRP and recent respiratory illness, will go ahead and start Colchicine 0.6mg  twice daily for possible pericarditis (will also check Sed Rate). I don't think current chest pain is due to ACS. However, I do think she would benefit from a right/ left cardiac catheterization at some point given her chronic dyspnea on exertion, moderate mitral stenosis, and abnormal Myoview  earlier this year. Discussed with MD and will go ahead and put patient on the board for this on Monday 07/30/2023.  Chronic Diastolic CHF Echo in 10/2022 showed LVEF of 65-70% with mild MR/moderate MS, mild TR, and moderate pulmonary hypertension. BNP elevated this admission at 733 >> 1,109. Chest x-ray showed a large right pleural effusion but no overt pulmonary edema. Does not appear grossly volume overloaded on exam. Continue home Spironolactone 25mg  daily. Per review of Care Everywhere, also looks like she is on Lasix 20mg  daily. Will restart this.  New Onset Atrial Fibrillation Patient had a 12 hour episode of atrial fibrillation from around 18:00 on 7/31 to 4:00 on 8/1 with rates as high as 130. Currently back in normal sinus rhythm. Potassium and magnesium level okay. She has a history of hyperparathyroidism and was recently started on Methimazole. TSH low at <0.010 but free T4 normal. Free T3 pending. Will increase Lopressor to 25mg  twice daily. Will start Amiodarone 200mg  daily. CHA2DS2-VASc = 6 (coronary calcifications noted on CT, CHF, HTN, age x2, female). Given only one episode of atrial fibrillation and need for additional procedures, will hold off on starting a DOAC right now.   Right Pleural Effusion Large right pleural effusion. S/p thoracentesis earlier this afternoon with removal of 1.3 L of fluid. Labs pending.  Mitral Stenosis Echo in 10/2023 at [facility] showed moderate mitral stenosis and mild mitral regurgitation. Can reassess on repeat Echo which has already been ordered.  Hypertension BP markedly elevated on admission and as high as 215/99. However, has since improved. Continue Amlodipine 5mg  daily and Irbesartan 300mg  daily (on Amlodipine-Olmesartan 5-40mg  daily at home). Continue Spironolactone 25mg  daily. Will increase Lopressor to 25mg  twice daily as above.  Otherwise, per primary team: - Hyperthyroidism (recent started on Methimazole) - GERD - Sjogren's  syndrome - Rheumatoid arthritis - Chronic back pain - Lewy-body dementia - Hyperlipidemia    Risk Assessment/Risk Scores:    New York Heart Association (NYHA) Functional Class NYHA Class III  CHA2DS2-VASc Score = 6  This indicates a 9.7% annual risk of stroke. The patient's score is based upon: CHF History: 1 HTN History: 1 Diabetes History: 0 Stroke History: 0 Vascular Disease History: 1 Age Score: 2 Gender Score: 1   For questions or updates, please contact [facility] Please consult [email or web address] for contact info under    Signed, Corrin Parker, PA-C  07/26/2023 1:31 PM

## 2023-07-26 NOTE — Progress Notes (Signed)
  Echocardiogram 2D Echocardiogram has been performed.  Lucendia Herrlich 07/26/2023, 1:56 PM

## 2023-07-26 NOTE — Evaluation (Addendum)
Clinical/Bedside Swallow Evaluation Patient Details  Name: Kristen Hansen MRN: [id number] Date of Birth: [birth year]  Today's Date: 07/26/2023 Time: SLP Start Time (ACUTE ONLY): 1035 SLP Stop Time (ACUTE ONLY): 1055 SLP Time Calculation (min) (ACUTE ONLY): 20 min  Past Medical History:  Past Medical History:  Diagnosis Date   Back pain    Cancer (HCC)    Skin, basal cell   Dyspnea    Fatigue    GERD (gastroesophageal reflux disease)    Headache(784.0)    history of migraines   History of kidney stones    Hypercholesteremia    Hypertension    Leg swelling    Bilateral   Occasional tremors    mild hand tremors   Osteoarthritis    PONV (postoperative nausea and vomiting)    RBD (REM behavioral disorder)    Rheumatoid arthritis(714.0)    Scoliosis of lumbar spine    Spinal stenosis    Multiple levels   Tic douloureux    Trigeminal neuralgia 03/30/2015   Vision abnormalities    Past Surgical History:  Past Surgical History:  Procedure Laterality Date   ABDOMINAL HYSTERECTOMY     AUGMENTATION MAMMAPLASTY Bilateral    COLONOSCOPY     JOINT REPLACEMENT     right uni knee, right hip with 2 revisions   KIDNEY STONE SURGERY Bilateral    KNEE ARTHROTOMY Right 02/27/2018   Procedure: RIGHT KNEE ARTHROTOMY; scar excision;  Surgeon: Ollen Gross, MD;  Location: [facility];  Service: Orthopedics;  Laterality: Right;   MOHS SURGERY     TONSILLECTOMY     TOTAL HIP ARTHROPLASTY     TOTAL KNEE ARTHROPLASTY  01/22/2013   Procedure: TOTAL KNEE ARTHROPLASTY;  Surgeon: Loanne Drilling, MD;  Location: [facility];  Service: Orthopedics;  Laterality: Right;  Revision of a Right Uni Knee to a Total Knee Arthroplasty   HPI:  Patient is a 77 y.o. female with PMH: Lewy body dementia, HTN, HLD, Sjogren's syndrome, RA, chronic pain, DVT, spinal stenosis, diastolic CHF, depression, REM behavior disorder, orthostatic hypotension, CKD 3, GERD, hypothyroidism. She presented to the hospital on 06/27/23 with chest  pain.  CT PE study showed no PE but did show a large right pleural effusion with compressive atelectasis.  ardiology was consulted in ED and had low suspicion for MI based on EKG review. CXR showed Moderate right pleural effusion, increased since the prior  radiograph. She had MBS completed at [facility] on 05/15/2023 which reported decline of swallow function since 2022 MBS consisting of "mistiming of the pharyngeal swallow, decreased bolus propulsion (base of tongue retraction), incomplete laryngeal vestibule closure, and diminished laryngeal pharyngeal sensation. Deficits result in mild post-swallow residual in the valleculae; penetration of mildly-thick liquid, and puree; and penetration AND aspiration of thin liquid."    Assessment / Plan / Recommendation  Clinical Impression  Patient presents with clinical s/s of dysphagia as per this bedside swallow evaluation as well as per modified barium swallow study completed at [facility] on 04/25/23. During today's evaluation, PO's were limited to water as patient is having a procedure today. She exhibited instances of mildly delayed cough with water (thin consistency). SLP spent time discussing patient's h/o dysphagia with spouse and patient. They both reported decreased appetite in past month or so with change in taste with foods such that she is eating mainly: corn, chicken salad, potato soup. She has had unintentional weight loss but spouse reports she has been gaining some weight back lately. Following modified barium  swallow study on 04/25/23, spouse reports that they tried nectar thick liquids but he noticed this resulted in patient consuming less liquids and so he stopped using thickener. At this time, SLP recommending mechanical soft solids (Dys 3) but to allow patient to have thin liquids to promote PO intake and reduce risk of dehydration. SLP will follow briefly for toleration. SLP Visit Diagnosis: Dysphagia, unspecified (R13.10)    Aspiration  Risk  Moderate aspiration risk    Diet Recommendation Dysphagia 3 (Mech soft);Thin liquid (will discuss with MD (she is at aspiration risk with thin liquids and malnutrition/dehydration risk with nectar thick liquids))    Liquid Administration via: Cup;Straw Medication Administration: Other (Comment) (as tolerated) Supervision: Patient able to self feed Compensations: Small sips/bites;Slow rate Postural Changes: Seated upright at 90 degrees    Other  Recommendations Oral Care Recommendations: Oral care BID    Recommendations for follow up therapy are one component of a multi-disciplinary discharge planning process, led by the attending physician.  Recommendations may be updated based on patient status, additional functional criteria and insurance authorization.  Follow up Recommendations Follow physician's recommendations for discharge plan and follow up therapies      Assistance Recommended at Discharge    Functional Status Assessment Patient has had a recent decline in their functional status and demonstrates the ability to make significant improvements in function in a reasonable and predictable amount of time.  Frequency and Duration min 1x/week  1 week       Prognosis Prognosis for improved oropharyngeal function: Good Barriers to Reach Goals: Time post onset;Cognitive deficits      Swallow Study   General Date of Onset: 07/25/23 HPI: Patient is a 77 y.o. female with PMH: Lewy body dementia, HTN, HLD, Sjogren's syndrome, RA, chronic pain, DVT, spinal stenosis, diastolic CHF, depression, REM behavior disorder, orthostatic hypotension, CKD 3, GERD, hypothyroidism. She presented to the hospital on 06/27/23 with chest pain.  CT PE study showed no PE but did show a large right pleural effusion with compressive atelectasis.  ardiology was consulted in ED and had low suspicion for MI based on EKG review. CXR showed Moderate right pleural effusion, increased since the prior  radiograph.  She had MBS completed at [facility] on 05/15/2023 which reported decline of swallow function since 2022 MBS consisting of "mistiming of the pharyngeal swallow, decreased bolus propulsion (base of tongue retraction), incomplete laryngeal vestibule closure, and diminished laryngeal pharyngeal sensation. Deficits result in mild post-swallow residual in the valleculae; penetration of mildly-thick liquid, and puree; and penetration AND aspiration of thin liquid." Type of Study: Bedside Swallow Evaluation Previous Swallow Assessment: MBS at [facility] (04/25/23) Diet Prior to this Study: NPO Temperature Spikes Noted: No Respiratory Status: Room air History of Recent Intubation: No Behavior/Cognition: Alert;Cooperative;Pleasant mood Oral Cavity Assessment: Within Functional Limits Oral Care Completed by SLP: No Oral Cavity - Dentition: Adequate natural dentition Vision: Functional for self-feeding Self-Feeding Abilities: Able to feed self Patient Positioning: Upright in bed Baseline Vocal Quality: Normal Volitional Cough: Weak Volitional Swallow: Able to elicit    Oral/Motor/Sensory Function Overall Oral Motor/Sensory Function: Within functional limits   Ice Chips     Thin Liquid Thin Liquid: Impaired Presentation: Self Fed;Straw Pharyngeal  Phase Impairments: Multiple swallows;Cough - Delayed    Nectar Thick Nectar Thick Liquid: Not tested   Honey Thick Honey Thick Liquid: Not tested   Puree Puree: Not tested   Solid     Solid: Not tested       T.  Fraser Din, [place], CCC-SLP Speech Therapy

## 2023-07-26 NOTE — TOC CM/SW Note (Addendum)
Transition of Care [facility]) - Inpatient Brief Assessment   Patient Details  Name: Kristen Hansen MRN: [id number] Date of Birth: 1946/12/16  Transition of Care [facility]) CM/SW Contact:    Gordy Clement, RN Phone Number: 07/26/2023, 12:44 PM   Clinical Narrative:  Patient from home with Husband  Here for chest pain Has had [facility] in the past.  PT/OT pending  attempted to see patient but she was in procedure. TOC will continue to follow patient for any additional discharge needs     Transition of Care Asessment: Insurance and Status: Insurance coverage has been reviewed Herbalist) Patient has primary care physician: Yes Thresa Ross, Delon [place], PA-C) Home environment has been reviewed: From home with Husband Prior level of function:: Dependent Prior/Current Home Services: No current home services (Has had [facility] in the past for Home health) Social Determinants of Health Reivew: SDOH reviewed no interventions necessary Readmission risk has been reviewed: Yes (18 %) Transition of care needs: transition of care needs identified, TOC will continue to follow

## 2023-07-26 NOTE — Progress Notes (Signed)
Bilateral lower extremity venous study completed.   Preliminary results relayed to MD and RN.  Please see CV Procedures for preliminary results.   , RVT  3:33 PM 07/26/23

## 2023-07-26 NOTE — Progress Notes (Signed)
2D echo attempted, patient in MRI. Will try later.  

## 2023-07-26 NOTE — Progress Notes (Signed)
OT Cancellation Note  Patient Details Name: Kristen Hansen MRN: [id number] DOB: April 04, 1946  Cancelled Treatment:    Reason Eval/Treat Not Completed: Other (comment). Attempted to see patient for a second time today and she is completely worn out from all the tests she has had today in and out of her room. Will re-attempt at later date.  Lindon Romp OT Acute Rehabilitation Services Office [phone]    Evette Georges 07/26/2023, 2:38 PM

## 2023-07-26 NOTE — Progress Notes (Signed)
PT Cancellation Note  Patient Details Name: Kristen Hansen MRN: [id number] DOB: 1946-03-12   Cancelled Treatment:    Reason Eval/Treat Not Completed: Patient at procedure or test/unavailable  Patient being transported off the unit at this time.    Jerolyn Center, PT Acute Rehabilitation Services  Office [phone]   [name] 07/26/2023, 11:01 AM

## 2023-07-26 NOTE — Procedures (Signed)
PROCEDURE SUMMARY:  Successful US guided right thoracentesis. Yielded 1.3 L of clear yellow fluid. Pt tolerated procedure well. No immediate complications.  Specimen sent for labs. CXR ordered; no post-procedure pneumothorax identified.   EBL < 2 mL  Mickie Kay, NP 07/26/2023 1:31 PM

## 2023-07-26 NOTE — Progress Notes (Signed)
OT Cancellation Note  Patient Details Name: Kristen Hansen MRN: [id number] DOB: 12-May-1946   Cancelled Treatment:    Reason Eval/Treat Not Completed: Patient at procedure or test/ unavailable. Pt down in MRI.  Lindon Romp OT Acute Rehabilitation Services Office [phone]    Evette Georges 07/26/2023, 11:46 AM

## 2023-07-27 ENCOUNTER — Inpatient Hospital Stay (HOSPITAL_COMMUNITY): Payer: Medicare PPO

## 2023-07-27 DIAGNOSIS — I480 Paroxysmal atrial fibrillation: Secondary | ICD-10-CM | POA: Diagnosis not present

## 2023-07-27 DIAGNOSIS — I308 Other forms of acute pericarditis: Secondary | ICD-10-CM | POA: Diagnosis not present

## 2023-07-27 DIAGNOSIS — J90 Pleural effusion, not elsewhere classified: Secondary | ICD-10-CM | POA: Diagnosis not present

## 2023-07-27 LAB — LACTATE DEHYDROGENASE: LDH: 155 U/L (ref 98–192)

## 2023-07-27 LAB — URIC ACID: Uric Acid, Serum: 4.7 mg/dL (ref 2.5–7.1)

## 2023-07-27 LAB — RESPIRATORY PANEL BY PCR

## 2023-07-27 LAB — HEPARIN LEVEL (UNFRACTIONATED): Heparin Unfractionated: <0.1 IU/mL — ABNORMAL LOW (ref 0.30–0.70)

## 2023-07-27 LAB — OSMOLALITY, URINE: Osmolality, Ur: 682 mOsm/kg (ref 300–900)

## 2023-07-27 LAB — OSMOLALITY: Osmolality: 275 mOsm/kg (ref 275–295)

## 2023-07-27 LAB — TROPONIN I (HIGH SENSITIVITY): Troponin I (High Sensitivity): 26 ng/L — ABNORMAL HIGH (ref <18)

## 2023-07-27 LAB — SODIUM, URINE, RANDOM: Sodium, Ur: 125 mmol/L

## 2023-07-27 MED ORDER — MAGNESIUM SULFATE 4 GM/100ML IV SOLN
4.0000 g | Freq: Once | INTRAVENOUS | Status: AC
  Administered 2023-07-27: 4 g via INTRAVENOUS
  Filled 2023-07-27: qty 100

## 2023-07-27 MED ORDER — IRBESARTAN 75 MG PO TABS
75.0000 mg | ORAL_TABLET | Freq: Every day | ORAL | Status: DC
  Administered 2023-07-28 – 2023-07-31 (×4): 75 mg via ORAL
  Filled 2023-07-27 (×4): qty 1

## 2023-07-27 MED ORDER — POTASSIUM CHLORIDE CRYS ER 20 MEQ PO TBCR
40.0000 meq | EXTENDED_RELEASE_TABLET | Freq: Two times a day (BID) | ORAL | Status: AC
  Administered 2023-07-27 – 2023-07-28 (×2): 40 meq via ORAL
  Filled 2023-07-27 (×3): qty 2

## 2023-07-27 MED ORDER — HEPARIN BOLUS VIA INFUSION
2000.0000 [IU] | Freq: Once | INTRAVENOUS | Status: AC
  Administered 2023-07-27: 2000 [IU] via INTRAVENOUS
  Filled 2023-07-27: qty 2000

## 2023-07-27 MED ORDER — AMIODARONE HCL 200 MG PO TABS
200.0000 mg | ORAL_TABLET | Freq: Two times a day (BID) | ORAL | Status: DC
  Administered 2023-07-27 – 2023-08-03 (×13): 200 mg via ORAL
  Filled 2023-07-27 (×14): qty 1

## 2023-07-27 MED ORDER — LOPERAMIDE HCL 2 MG PO CAPS
2.0000 mg | ORAL_CAPSULE | Freq: Four times a day (QID) | ORAL | Status: DC | PRN
  Administered 2023-07-27 – 2023-08-05 (×3): 2 mg via ORAL
  Filled 2023-07-27 (×3): qty 1

## 2023-07-27 MED ORDER — HEPARIN (PORCINE) 25000 UT/250ML-% IV SOLN
1300.0000 [IU]/h | INTRAVENOUS | Status: DC
  Administered 2023-07-27: 800 [IU]/h via INTRAVENOUS
  Administered 2023-07-28: 1150 [IU]/h via INTRAVENOUS
  Administered 2023-07-29 – 2023-07-30 (×2): 1300 [IU]/h via INTRAVENOUS
  Filled 2023-07-27 (×4): qty 250

## 2023-07-27 MED ORDER — IRBESARTAN 300 MG PO TABS
150.0000 mg | ORAL_TABLET | Freq: Every day | ORAL | Status: DC
Start: 2023-07-28 — End: 2023-07-27

## 2023-07-27 MED ORDER — SPIRONOLACTONE 12.5 MG HALF TABLET
12.5000 mg | ORAL_TABLET | Freq: Every day | ORAL | Status: DC
  Administered 2023-07-28 – 2023-08-08 (×12): 12.5 mg via ORAL
  Filled 2023-07-27 (×12): qty 1

## 2023-07-27 NOTE — Progress Notes (Signed)
ANTICOAGULATION CONSULT NOTE - Follow Up Consult  Pharmacy Consult for Heparin Indication: atrial fibrillation  Allergies  Allergen Reactions   Morphine And Codeine Hives   Prevacid [Lansoprazole] Other (See Comments)    unknown   Provigil [Modafinil] Other (See Comments)    dizziness   Adhesive [Tape] Rash    bandaids     Patient Measurements: Height: 5\' 10"  (177.8 cm) Weight: 55.8 kg (123 lb 0.3 oz) IBW/kg (Calculated) : 68.5 Heparin Dosing Weight: 55.8 kg  Vital Signs: Temp: 98.3 F (36.8 C) (08/02 1115) Temp Source: Oral (08/02 1115) BP: 132/69 (08/02 1115) Pulse Rate: 71 (08/02 1115)  Labs: Recent Labs    07/25/23 0308 07/25/23 0443 07/25/23 0636 07/25/23 0958 07/25/23 1533 07/25/23 1745 07/26/23 0559 07/27/23 0146 07/27/23 0852 07/27/23 1758  HGB 10.6*  --   --   --   --   --  10.5* 10.0*  --   --   HCT 33.1*  --   --   --   --   --  32.8* 30.4*  --   --   PLT 301  --   --   --   --   --  283 287  --   --   LABPROT  --   --  15.4*  --   --   --   --   --   --   --   INR  --   --  1.2  --   --   --   --   --   --   --   HEPARINUNFRC  --   --   --   --   --   --   --   --   --  <0.10*  CREATININE 1.04*  --   --   --   --   --  0.84 0.84  --   --   TROPONINIHS 30*   < >  --    < > 42* 41*  --   --  26*  --    < > = values in this interval not displayed.    Estimated Creatinine Clearance: 50.2 mL/min (by C-G formula based on SCr of 0.84 mg/dL).  Assessment: Patient is a 4 yof admitted for chest pain and new onset afib. Patient found to have pericardial effusion/pericarditis. She had thoracentesis yesterday with 1.3L removed. Repeat thoracentesis no longer needed, will initiate heparin infusion for her afib per Dr. Thedore Mins.   Initial heparin level is < 0.10, undetectable on heparin at 800 units/hr.  No known infusion problems per RN.   CBC stable this morning.  Goal of Therapy:  Heparin level 0.3-0.7 units/ml Monitor platelets by anticoagulation protocol:  Yes   Plan:  Rebolus with heparin 2000 units IV x 1 Increase heparin drip to 1000 units/hr Heparin level ~8hr after increase. Daily heparin level and CBC while on heparin. Monitor effusion status and any need for repeat thoracentesis.  Dennie Fetters, RPh 07/27/2023,7:40 PM

## 2023-07-27 NOTE — Progress Notes (Signed)
Speech Language Pathology Treatment: Dysphagia  Patient Details Name: Kristen Hansen MRN: [id number] DOB: [birth year] Today's Date: 07/27/2023 Time: 4098-1191 SLP Time Calculation (min) (ACUTE ONLY): 11 min  Assessment / Plan / Recommendation Clinical Impression  Pt followed up from yesterday's assessment. She had been on thick liquids, disliked and pt and husband prefer pt drink thin liquids with known risks. They also prefer her meats chopped and she is currently on a Dys 3 diet with thin liquids. Initial sips thin with straw were consumed without incident. After the graham cracker she did have 2 instances of coughing possibly from particles from cracker. SLP had her remove the straw and no cough noted but also when the straw was reintroduced she did not have any coughing episodes. SLP recommended she take small sips. Continue Dys 3 (chopped meats) per pt/husband request, thin liquids and pills as tolerated. No further ST needed.    HPI HPI: Patient is a 77 y.o. female with PMH: Lewy body dementia, HTN, HLD, Sjogren's syndrome, RA, chronic pain, DVT, spinal stenosis, diastolic CHF, depression, REM behavior disorder, orthostatic hypotension, CKD 3, GERD, hypothyroidism. She presented to the hospital on 06/27/23 with chest pain.  CT PE study showed no PE but did show a large right pleural effusion with compressive atelectasis.  ardiology was consulted in ED and had low suspicion for MI based on EKG review. CXR showed Moderate right pleural effusion, increased since the prior  radiograph. She had MBS completed at [facility] on 05/15/2023 which reported decline of swallow function since 2022 MBS consisting of "mistiming of the pharyngeal swallow, decreased bolus propulsion (base of tongue retraction), incomplete laryngeal vestibule closure, and diminished laryngeal pharyngeal sensation. Deficits result in mild post-swallow residual in the valleculae; penetration of mildly-thick liquid, and puree; and  penetration AND aspiration of thin liquid."      SLP Plan  All goals met;Discharge SLP treatment due to (comment)      Recommendations for follow up therapy are one component of a multi-disciplinary discharge planning process, led by the attending physician.  Recommendations may be updated based on patient status, additional functional criteria and insurance authorization.    Recommendations  Diet recommendations: Dysphagia 3 (mechanical soft);Thin liquid (per pt/spouse request) Liquids provided via: Straw;Cup Medication Administration: Other (Comment) (as tolerated) Supervision: Patient able to self feed Compensations: Small sips/bites;Slow rate Postural Changes and/or Swallow Maneuvers: Seated upright 90 degrees                  Oral care BID   Intermittent Supervision/Assistance Dysphagia, unspecified (R13.10)     All goals met;Discharge SLP treatment due to (comment)     Royce Macadamia  07/27/2023, 11:44 AM

## 2023-07-27 NOTE — Consult Note (Signed)
   NAME:  Kristen Hansen, MRN:  [id number], DOB:  [birth year], LOS: 2 ADMISSION DATE:  07/25/2023, CONSULTATION DATE: 07/27/2023 REFERRING MD: Dr. Thedore Mins, CHIEF COMPLAINT: Pleural effusion  History of Present Illness:  Asked to see for recurrent pleural effusion Recently had a thoracenteses for 1.1 L of exudative fluid  Denies any significant complaints at present  Presented to the hospital with chest pain, shortness of breath with activity  Pertinent  Medical History   Past Medical History:  Diagnosis Date   Back pain    Cancer (HCC)    Skin, basal cell   Dyspnea    Fatigue    GERD (gastroesophageal reflux disease)    Headache(784.0)    history of migraines   History of kidney stones    Hypercholesteremia    Hypertension    Leg swelling    Bilateral   Occasional tremors    mild hand tremors   Osteoarthritis    PONV (postoperative nausea and vomiting)    RBD (REM behavioral disorder)    Rheumatoid arthritis(714.0)    Scoliosis of lumbar spine    Spinal stenosis    Multiple levels   Tic douloureux    Trigeminal neuralgia 03/30/2015   Vision abnormalities    Significant Hospital Events: Including procedures, antibiotic start and stop dates in addition to other pertinent events   8/1 thoracentesis 1.3 L  Interim History / Subjective:  Denies significant complaints at present Denies chest pains Denies shortness of breath  Objective   Blood pressure 132/69, pulse 71, temperature 98.3 F (36.8 C), temperature source Oral, resp. rate 20, height 5\' 10"  (1.778 m), weight 55.8 kg, SpO2 99%.       No intake or output data in the 24 hours ending 07/27/23 1402 Filed Weights   07/25/23 0251 07/27/23 0110  Weight: 56.2 kg 55.8 kg    Examination: General: Elderly lady, does not appear to be in distress HENT: Moist oral mucosa Lungs: Decreased air entry at the bases with rales at the bases bilaterally Cardiovascular: S1-S2 appreciated Abdomen: Soft, bowel sounds  appreciated Extremities: No clubbing, no edema Neuro: Alert and oriented x 3 GU:   Resolved Hospital Problem list     Assessment & Plan:  Large right-sided pleural effusion s/p thoracentesis for 1.3 L of serous fluid -Fluid is exudative cytology pending  Chest x-ray showing recurrence of fluid however patient is asymptomatic at present -Appropriate to monitor closely -Continue diuresis -Continue anti-inflammatories as this may be related to her pleuropericardial inflammation  Repeat thoracentesis can be considered if reaccumulation of fluid with symptoms  History of chronic diastolic heart failure with increasing BNP levels Though does not appear fluid overloaded, chest x-ray also does not show significant pulmonary congestion -Will suggest to continue cautious diuresis  Atrial fibrillation -Will suggest to continue anticoagulation at present  Pericarditis May have the pleuropericarditis contributing to effusion as well -Continue anti-inflammatories  History of Sjogren's syndrome rheumatoid arthritis -Rheumatoid arthritis may have pleural component of pleural effusion -Less likely with Sjogren's syndrome  Continue to follow chest x-ray  Repeat thoracentesis if significant shortness of breath, need for oxygen, worsening chest x-ray   Virl Diamond, MD  PCCM Pager: See Loretha Stapler

## 2023-07-27 NOTE — Progress Notes (Addendum)
PROGRESS NOTE                                                                                                                                                                                                             Patient Demographics:    Kristen Hansen, is a 77 y.o. female, DOB - 02-25-1946, QMV:[id number]  Outpatient Primary MD for the patient is Kerin Salen, PA-C    LOS - 2  Admit date - 07/25/2023    Chief Complaint  Patient presents with   Chest Pain       Brief Narrative (HPI from H&P)    Kristen Hansen is a 77 y.o. female with medical history significant of hypertension, hyperlipidemia, Lewy body dementia, Sjogren's syndrome, rheumatoid arthritis, chronic pain, DVT, spinal stenosis, diastolic CHF, depression, REM behavior disorder, orthostatic hypotension, CKD 3, GERD, hyperthyroidism presenting with chest pain. Her chest pain has atypical nature and present for 2 weeks intermittently. She was found to have a moderate pleural effusion. Cardiology was consulted and they are planning a catheterization given her cardiac history.     Subjective:   States doing well. Chest pain is better with no episodes since yesterday.    Assessment  & Plan :   Assessment and Plan:  Chest Pain Pleural Effusion Pericardial Effusion/Pericarditis HFpEF Pt appears euvolemic on exam. She does have moderate pleural effusion which is increasing in size. This may be contributing to her chest pain as her pain is right sided but states she does have left sided pain as well. She had thoracentesis yesterday with 1.3 L removed. Pleural fluid studies pending. Given she has cardiac history and hx of dyspnea on exertion and reporting exertional chest pain, cardiologist consulted and planning LHC/RHC on 08/05. She was started on amidoarone 200 mg daily for her paroxysmal atrial fibrillation. Her TTE showed normal EF but showed concern  for pericarditis and she was started on colchicine 0.6 mg BID. Started on lasix 20 mg daily given signs of volume overload. BNP improved this am from 1109 to 928. From review of her records, it appears [facility] and was planning on stress test but given chest pain, plan for cath as above. Pt on pravastatin 40 mg and was started on metoprolol 25 mg BID by cardiology.   Paroxysmal Atrial Fibrillation: In Afib  this am. Cardiology following. On amiodarone. Will start heparin gtt.   Hyponatremia: Na 130, decrease from 135 yesterday. Urine studies ordered. Serum osmolality ordered. Calculated osm is 273.   Hypomagnesemia/Hypokalemia: Replete as needed for goal K>4 and Mag>2. Repleted this am.   Hx of DVT: D dimer elevated,  CTA negative, lower extremity doppler which was negative for DVT.    Hyperthyroidism: Recently started on methimazole. States she is complaint with this. Will continue here. FT4 normal and FT3 pending. Thyroid ultrasound ordered and shows bilateral nodules that do not meet criteria for biopsy for dedicated follow up.     Left Shoulder Pain: Has good PROM but pain with active ROM. Concern for RT injury. 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    Dysphagia: OP work up ongoing. EGD without any acute findings. Getting swallowing therapy. SLP consult placed. Placed on Dys 3 Diet here.   Chronic Problems HTN: BP 135/83 this am. Will continue home meds: amlodipine 5 mg every day, irbesartan 300 mg every day, lopressor 25 mg BID, spironolactone 25 mg every day.  GERD: Continue home med protonix. Does not endorse symptoms of reflux.  Sjogren's Syndrome: Not on any pharmacotherapy.  RA: Was on enbrel. Last used 2 months ago. Will need rheumatology follow up OP to restart this if needed.  Chronic back pain: Tylenol prn MDD: Zoloft 50 mg every day.  Continued here.  Lewy Body Dementia: On pyridostigmine and clonzapem, Will continue here. Last saw neurologist one month ago Trigeminal Neuralgia: On lamictal and trileptal. Both continued here.   Obesity: Estimated body mass index is 17.65 kg/m as calculated from the following:   Height as of this encounter: 5\' 10"  (1.778 m).   Weight as of this encounter: 55.8 kg.       Condition - Stable  Family Communication  :  Family updated at bedside  Code Status :  Full  Consults  :  Cardiology  PUD Prophylaxis : PPI   Procedures  :     LHC/RHC on 07/30/2023      Disposition Plan  :    Status is: Inpatient Remains inpatient appropriate because: needs continued medical work up including heart cath.   DVT Prophylaxis  :    enoxaparin (LOVENOX) injection 40 mg Start: 07/25/23 1600   Lab Results  Component Value Date   PLT 287 07/27/2023    Diet :  Diet Order             DIET DYS 3 Room service appropriate? Yes; Fluid consistency: Thin  Diet effective now                    Inpatient Medications  Scheduled Meds:  amiodarone  200 mg Oral Daily   amLODipine  5 mg Oral Daily   aspirin  81 mg Oral Daily   clonazePAM  1 mg Oral QHS   colchicine  0.6 mg Oral BID   enoxaparin (LOVENOX) injection  40 mg Subcutaneous Q24H   furosemide  20 mg Oral Daily   irbesartan  300 mg Oral Daily   lamoTRIgine  150 mg Oral Daily   methIMAzole  10 mg Oral Daily   metoprolol tartrate  25 mg Oral BID   montelukast  10 mg Oral QHS   Oxcarbazepine  300 mg Oral BID   pantoprazole  40 mg Oral Daily   pravastatin  40 mg Oral QAC breakfast   pyridostigmine  60 mg Oral Q8H   sertraline  50 mg Oral Daily   sodium chloride flush  3 mL Intravenous Q12H   spironolactone  25 mg Oral Daily   Continuous Infusions: PRN Meds:.acetaminophen **OR** acetaminophen, diltiazem, nitroGLYCERIN, polyethylene glycol, valACYclovir  Antibiotics  :    Anti-infectives (From admission, onward)     Start     Dose/Rate Route Frequency Ordered Stop   07/26/23 0540  valACYclovir (VALTREX) tablet 1,000 mg        1,000 mg Oral Every 12 hours PRN 07/26/23 0541           Objective:   Vitals:   07/26/23 1950 07/26/23 2149 07/27/23 0110 07/27/23 0400  BP:  (!) 136/93 118/82 135/83  Pulse: 69 73 94 (!) 105  Resp: 17  14 14   Temp:   98.8 F (37.1 C) 98.4 F (36.9 C)  TempSrc:   Axillary Axillary  SpO2: 98%  96% 96%  Weight:   55.8 kg   Height:        Wt Readings from Last 3 Encounters:  07/27/23 55.8 kg  06/11/23 61.7 kg  11/29/22 61.7 kg     Intake/Output Summary (Last 24 hours) at 07/27/2023 0522 Last data filed at 07/26/2023 1330 Gross per 24 hour  Intake 500 ml  Output 300 ml  Net 200 ml     Physical Exam  Physical Exam General: NAD HENT: NCAT Lungs: CTAB, no wheeze, rhonchi or rales.  Cardiovascular: IRIR. No LE edema Abdomen: No TTP, normal bowel sounds MSK: No asymmetry or muscle atrophy.  Skin: no lesions noted on exposed skin Neuro: Alert and oriented x4. CN grossly intact Psych: Normal mood and normal affect     RN pressure injury documentation: None    Data Review:    Recent Labs  Lab 07/25/23 0308 07/26/23 0559 07/27/23 0146  WBC 12.7* 9.4 10.0  HGB 10.6* 10.5* 10.0*  HCT 33.1* 32.8* 30.4*  PLT 301 283 287  MCV 82.8 83.5 84.0  MCH 26.5 26.7 27.6  MCHC 32.0 32.0 32.9  RDW 14.4 14.5 14.4  LYMPHSABS 0.7  --  1.5  MONOABS 0.9  --  0.9  EOSABS 0.2  --  0.4  BASOSABS 0.0  --  0.0    Recent Labs  Lab 07/25/23 0308 07/25/23 0636 07/26/23 0559 07/26/23 0600 07/27/23 0146  NA 134*  --  135  --  130*  K 3.9  --  3.8  --  3.5  CL 102  --  100  --  98  CO2 23  --  22  --  21*  ANIONGAP 9  --  13  --  11  GLUCOSE 126*  --  92  --  101*  BUN 21  --  17  --  21  CREATININE 1.04*  --  0.84  --  0.84  AST 18  --  13*  --  11*  ALT 13  --  13  --  12  ALKPHOS 141*  --  129*  --  104  BILITOT 0.4  --  0.5  --  0.6  ALBUMIN 2.8*  --  2.3*   --  2.1*  CRP  --   --   --  15.6*  --   DDIMER 3.49*  --   --   --   --   PROCALCITON  --   --   --  0.18  --  INR  --  1.2  --   --   --   TSH  --   --   --  <0.010*  --   HGBA1C  --   --   --  5.2  --   BNP 733.2*  --   --  1,109.0* 928.2*  MG  --   --   --  1.8 1.7  CALCIUM 8.8*  --  8.9  --  8.4*      Recent Labs  Lab 07/25/23 0308 07/25/23 0636 07/26/23 0559 07/26/23 0600 07/27/23 0146  CRP  --   --   --  15.6*  --   DDIMER 3.49*  --   --   --   --   PROCALCITON  --   --   --  0.18  --   INR  --  1.2  --   --   --   TSH  --   --   --  <0.010*  --   HGBA1C  --   --   --  5.2  --   BNP 733.2*  --   --  1,109.0* 928.2*  MG  --   --   --  1.8 1.7  CALCIUM 8.8*  --  8.9  --  8.4*    Recent Labs  Lab 07/25/23 0308 07/26/23 0559 07/26/23 0600 07/27/23 0146  WBC 12.7* 9.4  --  10.0  PLT 301 283  --  287  CRP  --   --  15.6*  --   DDIMER 3.49*  --   --   --   PROCALCITON  --   --  0.18  --   CREATININE 1.04* 0.84  --  0.84    ------------------------------------------------------------------------------------------------------------------ Lab Results  Component Value Date   CHOL 144 07/26/2023   HDL 55 07/26/2023   LDLCALC 74 07/26/2023   TRIG 76 07/26/2023   CHOLHDL 2.6 07/26/2023    Lab Results  Component Value Date   HGBA1C 5.2 07/26/2023    Recent Labs    07/26/23 0600  TSH <0.010*   ------------------------------------------------------------------------------------------------------------------ Cardiac Enzymes No results for input(s): "CKMB", "TROPONINI", "MYOGLOBIN" in the last 168 hours.  Invalid input(s): "CK"  Micro Results Recent Results (from the past 240 hour(s))  Gram stain     Status: None   Collection Time: 07/26/23 12:33 PM   Specimen: Pleura  Result Value Ref Range Status   Specimen Description PLEURAL  Final   Special Requests NONE  Final   Gram Stain   Final    RARE WBC PRESENT,BOTH PMN AND MONONUCLEAR NO ORGANISMS  SEEN Performed at [facility], [address] [place], [place] [address]    Report Status 07/26/2023 FINAL  Final    Radiology Reports VAS [place] LOWER EXTREMITY VENOUS (DVT)  Result Date: 07/26/2023  Lower Venous DVT Study Patient Name:  Kristen Hansen Imhoff  Date of Exam:   07/26/2023 Medical Rec #: [id number]      Accession #:    [id number] Date of Birth: 09/20/1946      Patient Gender: F Patient Age:   84 years Exam Location:  [facility] Procedure:      VAS [place] LOWER EXTREMITY VENOUS (DVT) Referring Phys: Bess Harvest [facility] --------------------------------------------------------------------------------  Indications: Swelling, and "rapidly rising D-Dimer".  Limitations: Body habitus and poor ultrasound/tissue interface. Comparison Study: No previous study. Performing Technologist: McKayla Maag RVT, [place]  Examination Guidelines: A complete evaluation includes B-mode imaging, spectral Doppler, color  Doppler, and power Doppler as needed of all accessible portions of each vessel. Bilateral testing is considered an integral part of a complete examination. Limited examinations for reoccurring indications may be performed as noted. The reflux portion of the exam is performed with the patient in reverse Trendelenburg.  +---------+---------------+---------+-----------+----------+--------------+ RIGHT    CompressibilityPhasicitySpontaneityPropertiesThrombus Aging +---------+---------------+---------+-----------+----------+--------------+ CFV      Full           Yes      Yes                                 +---------+---------------+---------+-----------+----------+--------------+ SFJ      Full                                                        +---------+---------------+---------+-----------+----------+--------------+ FV Prox  Full                                                        +---------+---------------+---------+-----------+----------+--------------+ FV Mid   Full                                                         +---------+---------------+---------+-----------+----------+--------------+ FV DistalFull                                                        +---------+---------------+---------+-----------+----------+--------------+ PFV      Full                                                        +---------+---------------+---------+-----------+----------+--------------+ POP      Full           Yes      Yes                                 +---------+---------------+---------+-----------+----------+--------------+ PTV      Full                                                        +---------+---------------+---------+-----------+----------+--------------+ PERO     Full                                                        +---------+---------------+---------+-----------+----------+--------------+   +---------+---------------+---------+-----------+----------+--------------+  LEFT     CompressibilityPhasicitySpontaneityPropertiesThrombus Aging +---------+---------------+---------+-----------+----------+--------------+ CFV      Full           Yes      Yes                                 +---------+---------------+---------+-----------+----------+--------------+ SFJ      Full                                                        +---------+---------------+---------+-----------+----------+--------------+ FV Prox  Full                                                        +---------+---------------+---------+-----------+----------+--------------+ FV Mid   Full                                                        +---------+---------------+---------+-----------+----------+--------------+ FV DistalFull                                                        +---------+---------------+---------+-----------+----------+--------------+ PFV      Full                                                         +---------+---------------+---------+-----------+----------+--------------+ POP                                                   Not visualized +---------+---------------+---------+-----------+----------+--------------+ PTV      Full                                                        +---------+---------------+---------+-----------+----------+--------------+ PERO     Full                                         limited        +---------+---------------+---------+-----------+----------+--------------+     Summary: RIGHT: - There is no evidence of deep vein thrombosis in the lower extremity.  - No cystic structure found in the popliteal fossa.  LEFT: - There is no evidence of deep vein thrombosis in the lower extremity. However, portions of this examination were limited- see technologist comments above.  -  No cystic structure found in the popliteal fossa.  *See table(s) above for measurements and observations. Electronically signed by Sherald Hess MD on 07/26/2023 at 5:37:26 PM.    Final    ECHOCARDIOGRAM COMPLETE  Result Date: 07/26/2023    ECHOCARDIOGRAM REPORT   Patient Name:   Kristen Hansen Date of Exam: 07/26/2023 Medical Rec #:  [id number]     Height:       70.0 in Accession #:    [id number]    Weight:       124.0 lb Date of Birth:  08-18-1946     BSA:          1.704 m Patient Age:    77 years      BP:           132/80 mmHg Patient Gender: F             HR:           73 bpm. Exam Location:  Inpatient Procedure: 2D Echo, Cardiac Doppler and Color Doppler Indications:    CHF-Acute Diastolic I50.31  History:        Patient has no prior history of Echocardiogram examinations.                 CHF, Signs/Symptoms:Hypotension and Chest Pain; Risk                 Factors:Hypertension and Dyslipidemia. CKD, stage 3.  Sonographer:    Lucendia Herrlich Referring Phys: Effie Shy PRASHANT K [facility] IMPRESSIONS  1. Distal septal apical hypokinesis . Left ventricular ejection fraction, by estimation, is 50  to 55%. The left ventricle has low normal function. The left ventricle has no regional wall motion abnormalities. There is mild left ventricular hypertrophy of the basal and septal segments. Left ventricular diastolic parameters are indeterminate.  2. Right ventricular systolic function is normal. The right ventricular size is normal.  3. A small pericardial effusion is present. The pericardial effusion is anterior to the right ventricle.  4. The mitral valve is degenerative. Mild mitral valve regurgitation. No evidence of mitral stenosis. Severe mitral annular calcification.  5. Tricuspid valve regurgitation is mild to moderate.  6. The aortic valve is tricuspid. There is moderate calcification of the aortic valve. There is moderate thickening of the aortic valve. Aortic valve regurgitation is trivial. Aortic valve sclerosis is present, with no evidence of aortic valve stenosis.  7. The inferior vena cava is normal in size with greater than 50% respiratory variability, suggesting right atrial pressure of 3 mmHg. FINDINGS  Left Ventricle: Distal septal apical hypokinesis. Left ventricular ejection fraction, by estimation, is 50 to 55%. The left ventricle has low normal function. The left ventricle has no regional wall motion abnormalities. The left ventricular internal cavity size was normal in size. There is mild left ventricular hypertrophy of the basal and septal segments. Left ventricular diastolic parameters are indeterminate. Right Ventricle: The right ventricular size is normal. No increase in right ventricular wall thickness. Right ventricular systolic function is normal. Left [facility]: Left atrial size was normal in size. Right [facility]: Right atrial size was normal in size. Pericardium: A small pericardial effusion is present. The pericardial effusion is anterior to the right ventricle. Mitral Valve: The mitral valve is degenerative in appearance. There is moderate thickening of the mitral valve leaflet(s).  There is moderate calcification of the mitral valve leaflet(s). Severe mitral annular calcification. Mild mitral valve regurgitation. No evidence of mitral valve stenosis. MV peak gradient, 10.4  mmHg. The mean mitral valve gradient is 3.0 mmHg. Tricuspid Valve: The tricuspid valve is normal in structure. Tricuspid valve regurgitation is mild to moderate. No evidence of tricuspid stenosis. Aortic Valve: The aortic valve is tricuspid. There is moderate calcification of the aortic valve. There is moderate thickening of the aortic valve. Aortic valve regurgitation is trivial. Aortic valve sclerosis is present, with no evidence of aortic valve  stenosis. Aortic valve peak gradient measures 4.8 mmHg. Pulmonic Valve: The pulmonic valve was normal in structure. Pulmonic valve regurgitation is mild. No evidence of pulmonic stenosis. Aorta: The aortic root is normal in size and structure. Venous: The inferior vena cava is normal in size with greater than 50% respiratory variability, suggesting right atrial pressure of 3 mmHg. IAS/Shunts: No atrial level shunt detected by color flow Doppler.  LEFT VENTRICLE PLAX 2D LVIDd:         3.10 cm   Diastology LVIDs:         1.90 cm   LV e' medial:    4.51 cm/s LV PW:         1.30 cm   LV E/e' medial:  26.2 LV IVS:        1.00 cm   LV e' lateral:   4.32 cm/s LVOT diam:     1.90 cm   LV E/e' lateral: 27.3 LV SV:         48 LV SV Index:   28 LVOT Area:     2.84 cm  RIGHT VENTRICLE             IVC RV S prime:     18.40 cm/s  IVC diam: 1.60 cm TAPSE (M-mode): 2.0 cm LEFT ATRIUM             Index        RIGHT ATRIUM           Index LA diam:        3.60 cm 2.11 cm/m   RA Area:     12.90 cm LA Vol (A2C):   64.7 ml 37.98 ml/m  RA Volume:   29.60 ml  17.38 ml/m LA Vol (A4C):   67.5 ml 39.62 ml/m LA Biplane Vol: 73.2 ml 42.97 ml/m  AORTIC VALVE AV Area (Vmax): 2.36 cm AV Vmax:        109.00 cm/s AV Peak Grad:   4.8 mmHg LVOT Vmax:      90.80 cm/s LVOT Vmean:     58.367 cm/s LVOT VTI:        0.171 m  AORTA Ao Root diam: 3.30 cm Ao Asc diam:  2.80 cm MITRAL VALVE                TRICUSPID VALVE MV Area (PHT): 3.85 cm     TR Peak grad:   43.6 mmHg MV Area VTI:   1.12 cm     TR Vmax:        330.00 cm/s MV Peak grad:  10.4 mmHg MV Mean grad:  3.0 mmHg     SHUNTS MV Vmax:       1.61 m/s     Systemic VTI:  0.17 m MV Vmean:      77.2 cm/s    Systemic Diam: 1.90 cm MV Decel Time: 197 msec MV E velocity: 118.00 cm/s MV A velocity: 107.00 cm/s MV E/A ratio:  1.10 Charlton Haws MD Electronically signed by Charlton Haws MD Signature Date/Time: 07/26/2023/2:19:09 PM    Final  MR SHOULDER LEFT WO CONTRAST  Result Date: 07/26/2023 CLINICAL DATA:  Chronic left shoulder pain. Rotator cuff disorder suspected. EXAM: MRI OF THE LEFT SHOULDER WITHOUT CONTRAST TECHNIQUE: Multiplanar, multisequence MR imaging of the shoulder was performed. No intravenous contrast was administered. COMPARISON:  None Available. FINDINGS: Despite efforts by the technologist and patient, motion artifact is present on today's exam and could not be eliminated. This reduces exam sensitivity and specificity. Rotator cuff: Within the limitations of patient motion artifact, there appears to be mild supraspinatus and infraspinatus tendinosis. No definite tendon tear. There is a focus of decreased T1 increased T2 signal cystic change versus focal marrow edema within the humeral head just deep to the slightly anterior aspect of the supraspinatus tendon footprint measuring up to 12 x 9 mm (AP by craniocaudal). Mild superior subscapularis intermediate T2 signal tendinosis. The teres minor is intact. Muscles:  Moderate supraspinatus muscle atrophy. Biceps long head: The intra-articular long head of the biceps tendon is intact. Acromioclavicular Joint: There are mild degenerative changes of the acromioclavicular joint including joint space narrowing and peripheral osteophytosis. Type III acromion with mild downsloping of the anterolateral acromion. Mild  fluid within the subacromial/subdeltoid bursa. Glenohumeral Joint: Moderate to severe glenohumeral cartilage thinning. Moderate inferior humeral head-neck junction degenerative osteophytosis. Mild glenoid fossa and medial humeral head cortical flattening/remodeling. Labrum: Mild-to-moderate attenuation and degenerative irregularity of the posterior glenoid labrum. Bones:  No acute fracture. Other: Mild joint fluid extends into the subcoracoid recess in this region there is a 15 x 12 x 10 mm low signal loose body (sagittal series 6, image 18, coronal series 4, image 14, and axial series 2, image 11). Within the inferior axillary pouch there is marrow fat intensity likely loose body measuring up to 2.2 cm (sagittal series 7, image 12). IMPRESSION: 1. Motion artifact technically limits this examination. 2. 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 3. Mild supraspinatus, infraspinatus, and superior subscapularis tendinosis. No definite rotator cuff tear. 4. Mild degenerative changes of the acromioclavicular joint. Type III acromion with mild downsloping of the anterolateral acromion. 5. Mild subacromial/subdeltoid bursitis. 6. Multiple loose bodies as above. Electronically Signed   By: Neita Garnet M.D.   On: 07/26/2023 13:58   IR THORACENTESIS ASP PLEURAL SPACE W/IMG GUIDE  Result Date: 07/26/2023 INDICATION: Patient with a history of heart failure presents today with new onset right pleural effusion. Interventional Radiology asked to perform a diagnostic and therapeutic thoracentesis. EXAM: ULTRASOUND GUIDED THORACENTESIS MEDICATIONS: 1% lidocaine 10 ml COMPLICATIONS: None immediate. PROCEDURE: An ultrasound guided thoracentesis was thoroughly discussed with the patient and questions answered. The benefits, risks, alternatives and complications were also discussed. The patient understands and wishes to proceed with the procedure. Written consent was obtained. Ultrasound was performed to localize and mark an  adequate pocket of fluid in the right chest. The area was then prepped and draped in the normal sterile fashion. 1% Lidocaine was used for local anesthesia. Under ultrasound guidance a 6 Fr Safe-T-Centesis catheter was introduced. Thoracentesis was performed. The catheter was removed and a dressing applied. FINDINGS: A total of approximately 1.3 L of clear yellow fluid was removed. Samples were sent to the laboratory as requested by the clinical team. IMPRESSION: Successful ultrasound guided right thoracentesis yielding 1.3 L of pleural fluid. Procedure performed by: Alwyn Ren, NP and supervised by Dr. Fredia Sorrow Electronically Signed   By: Irish Lack M.D.   On: 07/26/2023 13:48   DG Chest 1 View  Result Date: 07/26/2023 CLINICAL DATA:  Status post thoracentesis EXAM:  CHEST  1 VIEW COMPARISON:  07/25/2023 FINDINGS: Diminished fluid volume of a loculated lateral and basal right hydropneumothorax, with moderate, layering persistent fluid volume and associated pleural thickening. Left lung is normally aerated. Heart and mediastinum are normal. IMPRESSION: Diminished fluid volume of a loculated lateral and basal right hydropneumothorax, with moderate, layering persistent fluid volume and associated pleural thickening. Electronically Signed   By: Jearld Lesch M.D.   On: 07/26/2023 13:10   CT Angio Chest PE W and/or Wo Contrast  Result Date: 07/25/2023 CLINICAL DATA:  Pulmonary embolism suspected, low to intermediate probability, positive D-dimer. Chest pain radiating across shoulder and down left arm. Wheezing and shortness of breath. EXAM: CT ANGIOGRAPHY CHEST WITH CONTRAST TECHNIQUE: Multidetector CT imaging of the chest was performed using the standard protocol during bolus administration of intravenous contrast. Multiplanar CT image reconstructions and MIPs were obtained to evaluate the vascular anatomy. RADIATION DOSE REDUCTION: This exam was performed according to the departmental dose-optimization  program which includes automated exposure control, adjustment of the mA and/or kV according to patient size and/or use of iterative reconstruction technique. CONTRAST:  75mL OMNIPAQUE IOHEXOL 350 MG/ML SOLN COMPARISON:  12/13/2017. FINDINGS: Cardiovascular: The heart is normal in size and there is a trace pericardial effusion. Few scattered coronary artery calcifications are noted. There is atherosclerotic calcification of the aorta without evidence of aneurysm. The pulmonary trunk is normal in caliber. No evidence of pulmonary embolism. Mediastinum/Nodes: No enlarged mediastinal, hilar, or axillary lymph nodes. Thyroid gland, trachea, and esophagus demonstrate no significant findings. Lungs/Pleura: There is a large right pleural effusion with compressive atelectasis. No pneumothorax. Upper Abdomen: No acute abnormality. Musculoskeletal: Bilateral breast implants are noted. Cervical spinal fusion hardware is present. There are degenerative changes in the thoracic spine. Review of the MIP images confirms the above findings. IMPRESSION: 1. No evidence of pulmonary embolism. 2. Large right pleural effusion with compressive atelectasis. 3. Coronary artery calcifications. 4. Aortic atherosclerosis Electronically Signed   By: Thornell Sartorius M.D.   On: 07/25/2023 04:28   DG Chest Portable 1 View  Result Date: 07/25/2023 CLINICAL DATA:  Chest pain. EXAM: PORTABLE CHEST 1 VIEW COMPARISON:  Chest radiograph dated 06/11/2023. FINDINGS: Moderate right pleural effusion, significantly increased since the prior radiograph. There is associated compressive atelectasis of the right lung base versus pneumonia. Interval resolution of the previously seen left pleural effusion. The left lung is clear. No pneumothorax. The cardiac silhouette is within normal limits. No acute osseous pathology. IMPRESSION: Moderate right pleural effusion, increased since the prior radiograph. Electronically Signed   By: Elgie Collard M.D.   On:  07/25/2023 03:57      Signature  -   Gwenevere Abbot M.D on 07/27/2023 at 5:22 AM   -  To [place] go to [email or web address]

## 2023-07-27 NOTE — Hospital Course (Signed)
HPI per Dr. Alinda Money, Dea Bitting Klinck is a 77 y.o. female with medical history significant of hypertension, hyperlipidemia, Lewy body dementia, Sjogren's syndrome, rheumatoid arthritis, chronic pain, DVT, spinal stenosis, diastolic CHF, depression, REM behavior disorder, orthostatic hypotension, CKD 3, GERD, hyperthyroidism presenting with chest pain. Her chest pain has atypical nature and present for 2 weeks intermittently. She was found to have a moderate pleural effusion. Cardiology was consulted and planned for inpatient catheterization.    Chest Pain Pleural Effusion Pericardial Effusion/Pericarditis HFpEF Pt appears euvolemic on exam. She does have moderate pleural effusion which increased in size. This was thought to be contributing to her chest pain as her pain is right side and it improved with thoracentesis with 1.3 removed. The fluid was exudative in nature and re-accumulated. PCCM was consulted who recommended continuous monitoring.  Given she had cardiac history and hx of dyspnea on exertion and reporting exertional chest pain, cardiologist was consulted and planned LHC/RHC on 08/05. She was started on amidoarone 200 mg daily for her paroxysmal atrial fibrillation and IV heparin. She had a TTE showed normal EF but showed concern for pericarditis and she was started on colchicine 0.6 mg BID and aspirin 81 mg qd. She was started on lasix 20 mg daily given signs of volume overload. BNP improved with this. She was continued on pravastatin 40 mg and was started on metoprolol 25 mg BID by cardiology.    Paroxysmal Atrial Fibrillation: In Afib this am. Cardiology consulted. She was started on amiodarone. Was started on heparin inpatient.   Hyponatremia: Na 131, decrease from 135 yesterday. Urine studies ordered. Serum osmolality ordered. Calculated osm is 273.    Hypomagnesemia/Hypokalemia: Replete as needed for goal K>4 and Mag>2.    Hx of DVT: D dimer elevated,  CTA negative, lower extremity  doppler which was negative for DVT.    Hyperthyroidism: Recently started on methimazole. States she is complaint with this. Was continued here. FT4 normal and FT3 1.7. Thyroid ultrasound ordered and shows bilateral nodules that do not meet criteria for biopsy for dedicated follow up. Will need follow up with her endocrinologist OP.   Left Shoulder Pain: Has good PROM but pain with active ROM. Concern for RT injury. 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 Advise pt to continue regular exercises.    Dysphagia: OP work up ongoing. EGD without any acute findings. Getting swallowing therapy. SLP consult placed. Placed on Dys 3 Diet here.    Chronic Problems HTN: BP 135/83 this am. Both home meds were continued: amlodipine 5 mg every day, irbesartan 300 mg every day, lopressor 25 mg BID, spironolactone 25 mg every day.  GERD: Continued home med protonix. Does not endorse symptoms of reflux.  Sjogren's Syndrome: Not on any pharmacotherapy.  RA: Was on enbrel. Last used 2 months ago. Will need rheumatology follow up OP to restart this if needed.  Chronic back pain: Tylenol prn MDD: Zoloft 50 mg every day. Continued here.  Lewy Body Dementia: On pyridostigmine and clonzapem, both continued here. Last saw neurologist one month ago Trigeminal Neuralgia: On lamictal and trileptal. Both continued here.  [] Cath Monday [] CXR Monday to see if fluid re-accumulated.  []  []  []  []  []  []  []  []  []  [] 

## 2023-07-27 NOTE — Evaluation (Signed)
Physical Therapy Evaluation Patient Details Name: Kristen Hansen MRN: [id number] DOB: Aug 10, 1946 Today's Date: 07/27/2023  History of Present Illness  Pt is 77 yo female presenting to [facility] ED with chest pain. Chest pain has been moving and is increased with palpation. PMH: HTN, hyperlipidemia, Lewy body dementia, Sjogren's syndrome, rheumatoid arthritis, chronic pain, DVT, Spine disease, spinal stenosis, diastolic CHF, depression, REM behavior disorder, orthostatic Hypotension, CKD 3, GERD, hypothrydoidism.  Clinical Impression  Pt is presenting below baseline. Currently pt is Min A for rolling R/L, supine to sitting and sit<>stand. Pt is CGA for sitting to supine. Pt unable to progress ambulation due to orthostatics. Due to pt current level of function, PLOF, home set up and available assistance at home recommending skilled physical therapy services 3x/weekly on discharge from acute care hospital setting in order to decrease risk for falls, injury, immobility and re-hospitalization. Pt was orthostatic with standing; HR/O2 sat remained WNL.      If plan is discharge home, recommend the following: A little help with walking and/or transfers;Assistance with cooking/housework;Assist for transportation;Help with stairs or ramp for entrance   Can travel by private vehicle        Equipment Recommendations None recommended by PT  Recommendations for Other Services       Functional Status Assessment Patient has had a recent decline in their functional status and demonstrates the ability to make significant improvements in function in a reasonable and predictable amount of time.     Precautions / Restrictions Precautions Precautions: Fall Restrictions Weight Bearing Restrictions: No      Mobility  Bed Mobility Overal bed mobility: Needs Assistance Bed Mobility: Supine to Sit, Sit to Supine     Supine to sit: Min assist Sit to supine: Min guard   General bed mobility comments: Min A and  extra time for getting to EOB with HOB elevated. Min A to get trunk to midline and CGA with extra time for sitting to supine. Patient Response: Cooperative  Transfers Overall transfer level: Needs assistance Equipment used: 2 person hand held assist Transfers: Sit to/from Stand Sit to Stand: Min assist           General transfer comment: Min A with 2 people for stability in standing with slight posterior COM over BOS. Pt w/multi directional sway in standing requiring bil hand held support. Pt was orthostatic    Ambulation/Gait     General Gait Details: Not attempted at this time due to BP 86/54  Stairs    Unable at this time due to orthostatics         Tilt Bed Tilt Bed Patient Response: Cooperative  Modified Rankin (Stroke Patients Only)       Balance Overall balance assessment: Needs assistance   Sitting balance-Leahy Scale: Fair     Standing balance support: Bilateral upper extremity supported Standing balance-Leahy Scale: Fair Standing balance comment: Pt was reliant aon therapist support in order to prevent fall         Pertinent Vitals/Pain Pain Assessment Pain Assessment: No/denies pain    Home Living Family/patient expects to be discharged to:: Private residence Living Arrangements: Spouse/significant other Available Help at Discharge: Family;Available 24 hours/day;Available PRN/intermittently Type of Home: House Home Access: Stairs to enter Entrance Stairs-Rails: Can reach both;Left;Right Entrance Stairs-Number of Steps: 14   Home Layout: Two level;Laundry or work area in [employer] Equipment: Information systems manager - built Charity fundraiser (2 wheels);Rollator (4 wheels);Cane - single point;BSC/3in1;Cane - quad      Prior Function  Prior Level of Function : Needs assist  Cognitive Assist : ADLs (cognitive)   ADLs (Cognitive): Intermittent cues Physical Assist : ADLs (physical);Mobility (physical) Mobility (physical): Bed  mobility;Transfers;Gait;Stairs ADLs (physical): Grooming;Bathing;Dressing;Toileting Mobility Comments: Pt was not using an AD she was intermittently ambulating with furniture walking. Pt has had 1 fall in 6 months. Spouse intermittently provides a little assist for sit to stands. ADLs Comments: Pt spouse provides set up assist and Min-Mod A for dressing.     Hand Dominance   Dominant Hand: Right    Extremity/Trunk Assessment   Upper Extremity Assessment Upper Extremity Assessment: Defer to OT evaluation    Lower Extremity Assessment Lower Extremity Assessment: Generalized weakness    Cervical / Trunk Assessment Cervical / Trunk Assessment: Normal  Communication   Communication: No difficulties  Cognition Arousal/Alertness: Awake/alert Behavior During Therapy: WFL for tasks assessed/performed Overall Cognitive Status: History of cognitive impairments - at baseline     General Comments: Pt answered most questions appropriately occasionally deferring to spouse        General Comments General comments (skin integrity, edema, etc.): Orthostatics supine 146/62, Standing 110/86, standing 86/54, Back in supine wtih HOB elevated 132/69        Assessment/Plan    PT Assessment Patient needs continued PT services  PT Problem List Decreased strength;Decreased mobility;Decreased safety awareness;Decreased activity tolerance;Decreased cognition;Decreased balance       PT Treatment Interventions DME instruction;Therapeutic activities;Gait training;Therapeutic exercise;Patient/family education;Stair training;Balance training;Functional mobility training;Neuromuscular re-education    PT Goals (Current goals can be found in the Care Plan section)  Acute Rehab PT Goals Patient Stated Goal: Return home with spouse PT Goal Formulation: With patient Time For Goal Achievement: 08/10/23 Potential to Achieve Goals: Fair    Frequency Min 1X/week     Co-evaluation PT/OT/SLP  Co-Evaluation/Treatment: Yes Reason for Co-Treatment: Complexity of the patient's impairments (multi-system involvement);For patient/therapist safety;To address functional/ADL transfers PT goals addressed during session: Mobility/safety with mobility;Balance         AM-PAC PT "6 Clicks" Mobility  Outcome Measure Help needed turning from your back to your side while in a flat bed without using bedrails?: A Little Help needed moving from lying on your back to sitting on the side of a flat bed without using bedrails?: A Little Help needed moving to and from a bed to a chair (including a wheelchair)?: A Little Help needed standing up from a chair using your arms (e.g., wheelchair or bedside chair)?: A Little Help needed to walk in hospital room?: A Lot Help needed climbing 3-5 steps with a railing? : A Lot 6 Click Score: 16    End of Session   Activity Tolerance: Treatment limited secondary to medical complications (Comment) (limited due to orthostatics)   Nurse Communication: Mobility status;Other (comment) (pt with loose stools and orthostatics) PT Visit Diagnosis: Unsteadiness on feet (R26.81);Other abnormalities of gait and mobility (R26.89)    Time: 2595-6387 PT Time Calculation (min) (ACUTE ONLY): 58 min   Charges:   PT Evaluation $PT Eval Low Complexity: 1 Low PT Treatments $Therapeutic Activity: 8-22 mins PT General Charges $$ ACUTE PT VISIT: 1 Visit         Harrel Carina, DPT, CLT  Acute Rehabilitation Services Office: [phone] (Secure chat preferred)   Claudia Desanctis 07/27/2023, 11:35 AM

## 2023-07-27 NOTE — Progress Notes (Addendum)
Cardiology :  followed at [facility]  Subjective:  Breathing better but back in afib  Objective:  Vitals:   07/26/23 1950 07/26/23 2149 07/27/23 0110 07/27/23 0400  BP:  (!) 136/93 118/82 135/83  Pulse: 69 73 94 (!) 105  Resp: 17  14 14   Temp:   98.8 F (37.1 C) 98.4 F (36.9 C)  TempSrc:   Axillary Axillary  SpO2: 98%  96% 96%  Weight:   55.8 kg   Height:        Intake/Output from previous day:  Intake/Output Summary (Last 24 hours) at 07/27/2023 0102 Last data filed at 07/26/2023 1330 Gross per 24 hour  Intake 500 ml  Output --  Net 500 ml    Physical Exam:  Frail elderly female Decreased BS mid/base right ? 2 component friction rub with SEM Abdomen benign Trace edema  Lab Results: Basic Metabolic Panel: Recent Labs    07/26/23 0559 07/26/23 0600 07/27/23 0146  NA 135  --  130*  K 3.8  --  3.5  CL 100  --  98  CO2 22  --  21*  GLUCOSE 92  --  101*  BUN 17  --  21  CREATININE 0.84  --  0.84  CALCIUM 8.9  --  8.4*  MG  --  1.8 1.7   Liver Function Tests: Recent Labs    07/26/23 0559 07/27/23 0146  AST 13* 11*  ALT 13 12  ALKPHOS 129* 104  BILITOT 0.5 0.6  PROT 6.0* 5.3*  ALBUMIN 2.3* 2.1*   No results for input(s): "LIPASE", "AMYLASE" in the last 72 hours. CBC: Recent Labs    07/25/23 0308 07/26/23 0559 07/27/23 0146  WBC 12.7* 9.4 10.0  NEUTROABS 10.8*  --  7.1  HGB 10.6* 10.5* 10.0*  HCT 33.1* 32.8* 30.4*  MCV 82.8 83.5 84.0  PLT 301 283 287   Cardiac Enzymes: No results for input(s): "CKTOTAL", "CKMB", "CKMBINDEX", "TROPONINI" in the last 72 hours. BNP: Invalid input(s): "POCBNP" D-Dimer: Recent Labs    07/25/23 0308  DDIMER 3.49*   Hemoglobin A1C: Recent Labs    07/26/23 0600  HGBA1C 5.2   Fasting Lipid Panel: Recent Labs    07/26/23 0600  CHOL 144  HDL 55  LDLCALC 74  TRIG 76  CHOLHDL 2.6   Thyroid Function Tests: Recent Labs    07/26/23 0600 07/26/23 1406  TSH <0.010*  --   T3FREE  --  1.7*    Anemia Panel: No results for input(s): "VITAMINB12", "FOLATE", "FERRITIN", "TIBC", "IRON", "RETICCTPCT" in the last 72 hours.  Imaging: US THYROID  Result Date: 07/27/2023 CLINICAL DATA:  Hyperthyroidism. EXAM: THYROID ULTRASOUND TECHNIQUE: Ultrasound examination of the thyroid gland and adjacent soft tissues was performed. COMPARISON:  None Available. FINDINGS: Parenchymal Echotexture: Mildly heterogenous Isthmus: 0.2 cm Right lobe: 5.1 x 2.0 x 1.7 cm Left lobe: 4.4 x 1.6 x 1.6 cm _________________________________________________________ Estimated total number of nodules >/= 1 cm: 2 Number of spongiform nodules >/=  2 cm not described below (TR1): 0 Number of mixed cystic and solid nodules >/= 1.5 cm not described below (TR2): 0 _________________________________________________________ Nodule #1 is a hyperechoic nodule in the right inferior thyroid lobe. This nodule measures up to 0.6 cm and does not meet criteria for biopsy or dedicated follow-up. Nodule # 2: Location: Left; Superior Maximum size: 1.0 cm; Other 2 dimensions: 0.4 x 0.7 cm Composition: solid/almost completely solid (2) Echogenicity: hyperechoic (1) Shape: not taller-than-wide (0) Margins: ill-defined (0) Echogenic  foci: none (0) ACR TI-RADS total points: 3. ACR TI-RADS risk category: TR3 (3 points). ACR TI-RADS recommendations: Given size (<1.4 cm) and appearance, this nodule does NOT meet TI-RADS criteria for biopsy or dedicated follow-up. _________________________________________________________ Nodule # 3: Location: Left; Inferior Maximum size: 1.0 cm; Other 2 dimensions: 0.6 x 0.6 cm Composition: solid/almost completely solid (2) Echogenicity: hyperechoic (1) Shape: not taller-than-wide (0) Margins: ill-defined (0) Echogenic foci: none (0) ACR TI-RADS total points: 3. ACR TI-RADS risk category: TR3 (3 points). ACR TI-RADS recommendations: Given size (<1.4 cm) and appearance, this nodule does NOT meet TI-RADS criteria for biopsy or  dedicated follow-up. _________________________________________________________ Thyroid tissue is hypervascular. IMPRESSION: 1. Thyroid tissue is hypervascular and mildly heterogeneous. 2. Bilateral thyroid nodules that do not meet criteria for biopsy or dedicated follow-up. The above is in keeping with the ACR TI-RADS recommendations - J Am Coll Radiol 2017;14:587-595. Electronically Signed   By: Richarda Overlie M.D.   On: 07/27/2023 07:38   DG Chest Port 1 View  Result Date: 07/27/2023 CLINICAL DATA:  Shortness of breath.  Recent pleural effusion. EXAM: PORTABLE CHEST 1 VIEW COMPARISON:  07/26/23 FINDINGS: Normal heart size. Right pleural effusion is again noted. There is been interval worsening aeration to the right mid and right lower lung which is favored to represent interval reaccumulation of right pleural effusion with progressive veil like opacification of the right lower lung. Cannot exclude superimposed airspace disease or atelectasis. Scar like opacities noted in the right upper lobe. IMPRESSION: Interval worsening aeration to the right mid and right lower lung which is favored to represent interval reaccumulation of right pleural effusion with progressive veil like opacification of the right lower lung. Cannot exclude superimposed airspace disease or atelectasis. Electronically Signed   By: Signa Kell M.D.   On: 07/27/2023 07:03   VAS [place] LOWER EXTREMITY VENOUS (DVT)  Result Date: 07/26/2023  Lower Venous DVT Study Patient Name:  Kristen Hansen  Date of Exam:   07/26/2023 Medical Rec #: [id number]      Accession #:    [id number] Date of Birth: 30-Jan-1946      Patient Gender: F Patient Age:   77 years Exam Location:  [facility] Procedure:      VAS [place] LOWER EXTREMITY VENOUS (DVT) Referring Phys: Bess Harvest [facility] --------------------------------------------------------------------------------  Indications: Swelling, and "rapidly rising D-Dimer".  Limitations: Body habitus and poor ultrasound/tissue  interface. Comparison Study: No previous study. Performing Technologist: McKayla Maag RVT, [place]  Examination Guidelines: A complete evaluation includes B-mode imaging, spectral Doppler, color Doppler, and power Doppler as needed of all accessible portions of each vessel. Bilateral testing is considered an integral part of a complete examination. Limited examinations for reoccurring indications may be performed as noted. The reflux portion of the exam is performed with the patient in reverse Trendelenburg.  +---------+---------------+---------+-----------+----------+--------------+ RIGHT    CompressibilityPhasicitySpontaneityPropertiesThrombus Aging +---------+---------------+---------+-----------+----------+--------------+ CFV      Full           Yes      Yes                                 +---------+---------------+---------+-----------+----------+--------------+ SFJ      Full                                                        +---------+---------------+---------+-----------+----------+--------------+  FV Prox  Full                                                        +---------+---------------+---------+-----------+----------+--------------+ FV Mid   Full                                                        +---------+---------------+---------+-----------+----------+--------------+ FV DistalFull                                                        +---------+---------------+---------+-----------+----------+--------------+ PFV      Full                                                        +---------+---------------+---------+-----------+----------+--------------+ POP      Full           Yes      Yes                                 +---------+---------------+---------+-----------+----------+--------------+ PTV      Full                                                         +---------+---------------+---------+-----------+----------+--------------+ PERO     Full                                                        +---------+---------------+---------+-----------+----------+--------------+   +---------+---------------+---------+-----------+----------+--------------+ LEFT     CompressibilityPhasicitySpontaneityPropertiesThrombus Aging +---------+---------------+---------+-----------+----------+--------------+ CFV      Full           Yes      Yes                                 +---------+---------------+---------+-----------+----------+--------------+ SFJ      Full                                                        +---------+---------------+---------+-----------+----------+--------------+ FV Prox  Full                                                        +---------+---------------+---------+-----------+----------+--------------+  FV Mid   Full                                                        +---------+---------------+---------+-----------+----------+--------------+ FV DistalFull                                                        +---------+---------------+---------+-----------+----------+--------------+ PFV      Full                                                        +---------+---------------+---------+-----------+----------+--------------+ POP                                                   Not visualized +---------+---------------+---------+-----------+----------+--------------+ PTV      Full                                                        +---------+---------------+---------+-----------+----------+--------------+ PERO     Full                                         limited        +---------+---------------+---------+-----------+----------+--------------+     Summary: RIGHT: - There is no evidence of deep vein thrombosis in the lower extremity.  - No cystic structure found in  the popliteal fossa.  LEFT: - There is no evidence of deep vein thrombosis in the lower extremity. However, portions of this examination were limited- see technologist comments above.  - No cystic structure found in the popliteal fossa.  *See table(s) above for measurements and observations. Electronically signed by Sherald Hess MD on 07/26/2023 at 5:37:26 PM.    Final    ECHOCARDIOGRAM COMPLETE  Result Date: 07/26/2023    ECHOCARDIOGRAM REPORT   Patient Name:   Kristen Hansen Date of Exam: 07/26/2023 Medical Rec #:  [id number]     Height:       70.0 in Accession #:    [id number]    Weight:       124.0 lb Date of Birth:  [birth year]     BSA:          1.704 m Patient Age:    77 years      BP:           132/80 mmHg Patient Gender: F             HR:           73 bpm. Exam Location:  Inpatient Procedure: 2D Echo, Cardiac Doppler and Color Doppler Indications:    CHF-Acute Diastolic I50.31  History:        Patient has no prior history of Echocardiogram examinations.                 CHF, Signs/Symptoms:Hypotension and Chest Pain; Risk                 Factors:Hypertension and Dyslipidemia. CKD, stage 3.  Sonographer:    Lucendia Herrlich Referring Phys: Effie Shy PRASHANT [facility] IMPRESSIONS  1. Distal septal apical hypokinesis . Left ventricular ejection fraction, by estimation, is 50 to 55%. The left ventricle has low normal function. The left ventricle has no regional wall motion abnormalities. There is mild left ventricular hypertrophy of the basal and septal segments. Left ventricular diastolic parameters are indeterminate.  2. Right ventricular systolic function is normal. The right ventricular size is normal.  3. A small pericardial effusion is present. The pericardial effusion is anterior to the right ventricle.  4. The mitral valve is degenerative. Mild mitral valve regurgitation. No evidence of mitral stenosis. Severe mitral annular calcification.  5. Tricuspid valve regurgitation is mild to moderate.  6. The aortic  valve is tricuspid. There is moderate calcification of the aortic valve. There is moderate thickening of the aortic valve. Aortic valve regurgitation is trivial. Aortic valve sclerosis is present, with no evidence of aortic valve stenosis.  7. The inferior vena cava is normal in size with greater than 50% respiratory variability, suggesting right atrial pressure of 3 mmHg. FINDINGS  Left Ventricle: Distal septal apical hypokinesis. Left ventricular ejection fraction, by estimation, is 50 to 55%. The left ventricle has low normal function. The left ventricle has no regional wall motion abnormalities. The left ventricular internal cavity size was normal in size. There is mild left ventricular hypertrophy of the basal and septal segments. Left ventricular diastolic parameters are indeterminate. Right Ventricle: The right ventricular size is normal. No increase in right ventricular wall thickness. Right ventricular systolic function is normal. Left Atrium: Left atrial size was normal in size. Right Atrium: Right atrial size was normal in size. Pericardium: A small pericardial effusion is present. The pericardial effusion is anterior to the right ventricle. Mitral Valve: The mitral valve is degenerative in appearance. There is moderate thickening of the mitral valve leaflet(s). There is moderate calcification of the mitral valve leaflet(s). Severe mitral annular calcification. Mild mitral valve regurgitation. No evidence of mitral valve stenosis. MV peak gradient, 10.4 mmHg. The mean mitral valve gradient is 3.0 mmHg. Tricuspid Valve: The tricuspid valve is normal in structure. Tricuspid valve regurgitation is mild to moderate. No evidence of tricuspid stenosis. Aortic Valve: The aortic valve is tricuspid. There is moderate calcification of the aortic valve. There is moderate thickening of the aortic valve. Aortic valve regurgitation is trivial. Aortic valve sclerosis is present, with no evidence of aortic valve   stenosis. Aortic valve peak gradient measures 4.8 mmHg. Pulmonic Valve: The pulmonic valve was normal in structure. Pulmonic valve regurgitation is mild. No evidence of pulmonic stenosis. Aorta: The aortic root is normal in size and structure. Venous: The inferior vena cava is normal in size with greater than 50% respiratory variability, suggesting right atrial pressure of 3 mmHg. IAS/Shunts: No atrial level shunt detected by color flow Doppler.  LEFT VENTRICLE PLAX 2D LVIDd:         3.10 cm   Diastology LVIDs:         1.90 cm   LV e' medial:    4.51 cm/s LV PW:  1.30 cm   LV E/e' medial:  26.2 LV IVS:        1.00 cm   LV e' lateral:   4.32 cm/s LVOT diam:     1.90 cm   LV E/e' lateral: 27.3 LV SV:         48 LV SV Index:   28 LVOT Area:     2.84 cm  RIGHT VENTRICLE             IVC RV S prime:     18.40 cm/s  IVC diam: 1.60 cm TAPSE (M-mode): 2.0 cm LEFT ATRIUM             Index        RIGHT ATRIUM           Index LA diam:        3.60 cm 2.11 cm/m   RA Area:     12.90 cm LA Vol (A2C):   64.7 ml 37.98 ml/m  RA Volume:   29.60 ml  17.38 ml/m LA Vol (A4C):   67.5 ml 39.62 ml/m LA Biplane Vol: 73.2 ml 42.97 ml/m  AORTIC VALVE AV Area (Vmax): 2.36 cm AV Vmax:        109.00 cm/s AV Peak Grad:   4.8 mmHg LVOT Vmax:      90.80 cm/s LVOT Vmean:     58.367 cm/s LVOT VTI:       0.171 m  AORTA Ao Root diam: 3.30 cm Ao Asc diam:  2.80 cm MITRAL VALVE                TRICUSPID VALVE MV Area (PHT): 3.85 cm     TR Peak grad:   43.6 mmHg MV Area VTI:   1.12 cm     TR Vmax:        330.00 cm/s MV Peak grad:  10.4 mmHg MV Mean grad:  3.0 mmHg     SHUNTS MV Vmax:       1.61 m/s     Systemic VTI:  0.17 m MV Vmean:      77.2 cm/s    Systemic Diam: 1.90 cm MV Decel Time: 197 msec MV E velocity: 118.00 cm/s MV A velocity: 107.00 cm/s MV E/A ratio:  1.10 Charlton Haws MD Electronically signed by Charlton Haws MD Signature Date/Time: 07/26/2023/2:19:09 PM    Final    MR SHOULDER LEFT WO CONTRAST  Result Date:  07/26/2023 CLINICAL DATA:  Chronic left shoulder pain. Rotator cuff disorder suspected. EXAM: MRI OF THE LEFT SHOULDER WITHOUT CONTRAST TECHNIQUE: Multiplanar, multisequence MR imaging of the shoulder was performed. No intravenous contrast was administered. COMPARISON:  None Available. FINDINGS: Despite efforts by the technologist and patient, motion artifact is present on today's exam and could not be eliminated. This reduces exam sensitivity and specificity. Rotator cuff: Within the limitations of patient motion artifact, there appears to be mild supraspinatus and infraspinatus tendinosis. No definite tendon tear. There is a focus of decreased T1 increased T2 signal cystic change versus focal marrow edema within the humeral head just deep to the slightly anterior aspect of the supraspinatus tendon footprint measuring up to 12 x 9 mm (AP by craniocaudal). Mild superior subscapularis intermediate T2 signal tendinosis. The teres minor is intact. Muscles:  Moderate supraspinatus muscle atrophy. Biceps long head: The intra-articular long head of the biceps tendon is intact. Acromioclavicular Joint: There are mild degenerative changes of the acromioclavicular joint including joint space narrowing and peripheral osteophytosis. Type III acromion  with mild downsloping of the anterolateral acromion. Mild fluid within the subacromial/subdeltoid bursa. Glenohumeral Joint: Moderate to severe glenohumeral cartilage thinning. Moderate inferior humeral head-neck junction degenerative osteophytosis. Mild glenoid fossa and medial humeral head cortical flattening/remodeling. Labrum: Mild-to-moderate attenuation and degenerative irregularity of the posterior glenoid labrum. Bones:  No acute fracture. Other: Mild joint fluid extends into the subcoracoid recess in this region there is a 15 x 12 x 10 mm low signal loose body (sagittal series 6, image 18, coronal series 4, image 14, and axial series 2, image 11). Within the inferior  axillary pouch there is marrow fat intensity likely loose body measuring up to 2.2 cm (sagittal series 7, image 12). IMPRESSION: 1. Motion artifact technically limits this examination. 2. 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 3. Mild supraspinatus, infraspinatus, and superior subscapularis tendinosis. No definite rotator cuff tear. 4. Mild degenerative changes of the acromioclavicular joint. Type III acromion with mild downsloping of the anterolateral acromion. 5. Mild subacromial/subdeltoid bursitis. 6. Multiple loose bodies as above. Electronically Signed   By: Neita Garnet M.D.   On: 07/26/2023 13:58   IR THORACENTESIS ASP PLEURAL SPACE W/IMG GUIDE  Result Date: 07/26/2023 INDICATION: Patient with a history of heart failure presents today with new onset right pleural effusion. Interventional Radiology asked to perform a diagnostic and therapeutic thoracentesis. EXAM: ULTRASOUND GUIDED THORACENTESIS MEDICATIONS: 1% lidocaine 10 ml COMPLICATIONS: None immediate. PROCEDURE: An ultrasound guided thoracentesis was thoroughly discussed with the patient and questions answered. The benefits, risks, alternatives and complications were also discussed. The patient understands and wishes to proceed with the procedure. Written consent was obtained. Ultrasound was performed to localize and mark an adequate pocket of fluid in the right chest. The area was then prepped and draped in the normal sterile fashion. 1% Lidocaine was used for local anesthesia. Under ultrasound guidance a 6 Fr Safe-T-Centesis catheter was introduced. Thoracentesis was performed. The catheter was removed and a dressing applied. FINDINGS: A total of approximately 1.3 L of clear yellow fluid was removed. Samples were sent to the laboratory as requested by the clinical team. IMPRESSION: Successful ultrasound guided right thoracentesis yielding 1.3 L of pleural fluid. Procedure performed by: Alwyn Ren, NP and supervised by Dr. Fredia Sorrow  Electronically Signed   By: Irish Lack M.D.   On: 07/26/2023 13:48   DG Chest 1 View  Result Date: 07/26/2023 CLINICAL DATA:  Status post thoracentesis EXAM: CHEST  1 VIEW COMPARISON:  07/25/2023 FINDINGS: Diminished fluid volume of a loculated lateral and basal right hydropneumothorax, with moderate, layering persistent fluid volume and associated pleural thickening. Left lung is normally aerated. Heart and mediastinum are normal. IMPRESSION: Diminished fluid volume of a loculated lateral and basal right hydropneumothorax, with moderate, layering persistent fluid volume and associated pleural thickening. Electronically Signed   By: Jearld Lesch M.D.   On: 07/26/2023 13:10    Cardiac Studies:  ECG: afib LAD poor R wave progression ST elevation resolved   Telemetry:afib rate 80-90   Echo: EF 50-55% distal septal apical hypokinesis   Medications:    amiodarone  200 mg Oral Daily   amLODipine  5 mg Oral Daily   aspirin  81 mg Oral Daily   clonazePAM  1 mg Oral QHS   colchicine  0.6 mg Oral BID   enoxaparin (LOVENOX) injection  40 mg Subcutaneous Q24H   furosemide  20 mg Oral Daily   irbesartan  300 mg Oral Daily   lamoTRIgine  150 mg Oral Daily   methIMAzole  10 mg  Oral Daily   metoprolol tartrate  25 mg Oral BID   montelukast  10 mg Oral QHS   Oxcarbazepine  300 mg Oral BID   pantoprazole  40 mg Oral Daily   potassium chloride  40 mEq Oral BID   pravastatin  40 mg Oral QAC breakfast   pyridostigmine  60 mg Oral Q8H   sertraline  50 mg Oral Daily   sodium chloride flush  3 mL Intravenous Q12H   spironolactone  25 mg Oral Daily      Assessment/Plan:  Chest Pain Patient has a history of chest pain and dyspnea on exertion. Myoview in 12/2022 was abnormal with a perfusion defect of the anterior wall consistent with ischemia. Cardiac catheterization was discussed but patient declined and preferred medical therapy. Presents with clinical picture pleuro pericarditis with flat  troponin and transient diffuse ST elevation. Small pericardial effusion on TTE with RWMA in septum and apex Plan on right /left cath Monday .   Chronic Diastolic CHF Echo in 10/2022 showed LVEF of 65-70% with mild MR/moderate MS, mild TR, and moderate pulmonary hypertension. BNP elevated this admission at 733 >> 1,109. Chest x-ray showed a large right pleural effusion but no overt pulmonary edema. Does not appear grossly volume overloaded on exam. Continue home Spironolactone 25mg  daily. Per review of Care Everywhere, also looks like she is on Lasix 20mg  daily. Will restart this.   New Onset Atrial Fibrillation Has recurred this am. CHADVASC 6 will start on amiodarone since < 48 hours and heparin so long as 2 nd thoracentesis not planned    Right Pleural Effusion Large right pleural effusion. S/p thoracentesis 07/26/23 with removal of 1.3 L of fluid. CXR to my read with some re expansion atelectasis and small to moderate residual effusion ? Repeat procedure today    Mitral Stenosis Echo in 10/2023 at [facility] showed moderate mitral stenosis and mild mitral regurgitation. Can reassess on repeat Echo which has already been ordered.   Hypertension BP markedly elevated on admission and as high as 215/99. However, has since improved. Continue Amlodipine 5mg  daily and Irbesartan 300mg  daily (on Amlodipine-Olmesartan 5-40mg  daily at home). Continue Spironolactone 25mg  daily. Will increase Lopressor to 25mg  twice daily as above.  Pericarditis:  transient diffuse ECG changes small effusion on TTE in association with pleuritis and pleural effusion ESR 54 CRP 15.6 Colchicine started ASA avoid NSAI's with need for anticoagulation Consider 20 panel resp for viral etiology   Otherwise, per primary team: - Hyperthyroidism (recent started on Methimazole) - GERD - Sjogren's syndrome - Rheumatoid arthritis - Chronic back pain - Lewy-body dementia - Hyperlipidemia  Charlton Haws 07/27/2023, 9:03 AM

## 2023-07-27 NOTE — Evaluation (Signed)
Occupational Therapy Evaluation Patient Details Name: Kristen Hansen MRN: [id number] DOB: 06/01/1946 Today's Date: 07/27/2023   History of Present Illness Pt is 77 yo female presenting to [facility] ED with chest pain. Chest pain has been moving and is increased with palpation. PMH: HTN, hyperlipidemia, Lewy body dementia, Sjogren's syndrome, rheumatoid arthritis, chronic pain, DVT, Spine disease, spinal stenosis, diastolic CHF, depression, REM behavior disorder, orthostatic Hypotension, CKD 3, GERD, hypothrydoidism.   Clinical Impression   At baseline, pt requires Min-Mod assist for dressing and Independent to Mod I with all other ADLs with occasional cues needed for safety and sequencing. At baseline, pt completes functional transfers/mobility in the home Mod I, often furniture walking and requiring occasional assist of husband to stand from low chairs. Pt's husband assist with all IADLs at baseline. Pt now presents with decreased activity tolerance, generalized B UE weakness, and decreased safety and independence with ADLs and functional transfers/mobility. Pt currently demonstrates ability to complete UB ADLs with Set up to Min assist, LB ADLs with Mod to Max assist +2, and functional transfers with hand held with Mod assist +2. Pt with episode of bowel incontinence upon arrival and with episode of orthostatic hypotension in sitting and standing (additional details below in General Comments) this session with RN notified. All other VSS on RA throughout session. Pt will benefit from acute skilled OT services to address deficits outlined below, decrease caregiver burden, and increase safety and independence with ADLs, functional transfers, and functional mobility. Post acute discharge, pt will benefit from continued skilled OT services in the home to maximize rehab potential.      Recommendations for follow up therapy are one component of a multi-disciplinary discharge planning process, led by the attending  physician.  Recommendations may be updated based on patient status, additional functional criteria and insurance authorization.   Assistance Recommended at Discharge Frequent or constant Supervision/Assistance  Patient can return home with the following A lot of help with walking and/or transfers;A lot of help with bathing/dressing/bathroom;Assistance with cooking/housework;Direct supervision/assist for medications management;Direct supervision/assist for financial management;Help with stairs or ramp for entrance;Assist for transportation;Assistance with feeding (Set up for self feeding)    Functional Status Assessment  Patient has had a recent decline in their functional status and demonstrates the ability to make significant improvements in function in a reasonable and predictable amount of time.  Equipment Recommendations  None recommended by OT    Recommendations for Other Services       Precautions / Restrictions Precautions Precautions: Fall Precaution Comments: watch BP Restrictions Weight Bearing Restrictions: No      Mobility Bed Mobility Overal bed mobility: Needs Assistance Bed Mobility: Supine to Sit, Sit to Supine     Supine to sit: Min assist Sit to supine: Min guard   General bed mobility comments: Min A and extra time for getting to EOB with HOB elevated. Min A to get trunk to midline and CGA with extra time for sitting to supine.    Transfers Overall transfer level: Needs assistance Equipment used: 2 person hand held assist Transfers: Sit to/from Stand Sit to Stand: Min assist           General transfer comment: Min A with 2 people for stability in standing with slight posterior COM over BOS. Pt w/multi directional sway in standing requiring bil hand held support. Pt was orthostatic      Balance Overall balance assessment: Needs assistance Sitting-balance support: Single extremity supported, No upper extremity supported, Feet supported Sitting  balance-Leahy  Scale: Fair     Standing balance support: Bilateral upper extremity supported Standing balance-Leahy Scale: Fair Standing balance comment: Pt was reliant on therapist support in order to prevent fall                           ADL either performed or assessed with clinical judgement   ADL Overall ADL's : Needs assistance/impaired Eating/Feeding: Set up;Sitting   Grooming: Set up;Sitting   Upper Body Bathing: Minimal assistance;Sitting;Cueing for sequencing   Lower Body Bathing: Moderate assistance;Cueing for sequencing;Sitting/lateral leans;Sit to/from stand   Upper Body Dressing : Min guard;Cueing for sequencing;Sitting (assist with managing lines)   Lower Body Dressing: Moderate assistance;Sit to/from stand;Sitting/lateral leans;Cueing for sequencing;Cueing for safety;Cueing for compensatory techniques   Toilet Transfer: Minimal assistance;+2 for physical assistance;+2 for safety/equipment;Stand-pivot;Cueing for safety;Cueing for sequencing;BSC/3in1 (hand held assist +2)   Toileting- Clothing Manipulation and Hygiene: +2 for physical assistance;+2 for safety/equipment;Maximal assistance;Cueing for safety;Cueing for sequencing;Sitting/lateral lean;Sit to/from stand Toileting - Clothing Manipulation Details (indicate cue type and reason): Pt demonstrates ability to complete toileting using BSC. However, pt with episode of bowel in continance in the bed upon arrival with pt requiring Total assist +2 for peri care and linen change in the bed.       General ADL Comments: Functional mobility deferred this session for pt/therapist safety due to pt with episode of orthostatic hypotension in sitting and standing this session.     Vision Baseline Vision/History: 1 Wears glasses (readers) Patient Visual Report: No change from baseline       Perception     Praxis      Pertinent Vitals/Pain Pain Assessment Pain Assessment: No/denies pain     Hand Dominance  Right   Extremity/Trunk Assessment Upper Extremity Assessment Upper Extremity Assessment: Generalized weakness   Lower Extremity Assessment Lower Extremity Assessment: Defer to PT evaluation   Cervical / Trunk Assessment Cervical / Trunk Assessment: Normal   Communication Communication Communication: No difficulties   Cognition Arousal/Alertness: Awake/alert Behavior During Therapy: WFL for tasks assessed/performed Overall Cognitive Status: History of cognitive impairments - at baseline                                 General Comments: Pt answered most questions appropriately occasionally deferring to spouse. Pt demonstrates ability to follow 1-step commands consistently. Pt oriented to self, husband, and situaiton.     General Comments  Orthostatics supine 146/62, Standing 110/86, standing 86/54, Back in supine wtih HOB elevated 132/69. All other VSS on RA throughout session. Pt's husband present throughout session.    Exercises     Shoulder Instructions      Home Living Family/patient expects to be discharged to:: Private residence Living Arrangements: Spouse/significant other Available Help at Discharge: Family;Available 24 hours/day;Available PRN/intermittently Type of Home: House Home Access: Stairs to enter Entrance Stairs-Number of Steps: 14 Entrance Stairs-Rails: Can reach both;Left;Right Home Layout: Two level;Laundry or work area in basement     [employer]: Producer, television/film/video: Handicapped height Bathroom Accessibility: Yes How Accessible: Accessible via walker Home Equipment: Shower seat - built Charity fundraiser (2 wheels);Rollator (4 wheels);Cane - single point;BSC/3in1;Cane - quad          Prior Functioning/Environment Prior Level of Function : Needs assist  Cognitive Assist : ADLs (cognitive)   ADLs (Cognitive): Intermittent cues Physical Assist : ADLs (physical);Mobility (physical) Mobility (physical): Bed  mobility;Transfers;Gait;Stairs ADLs (physical): Grooming;Bathing;Dressing;Toileting Mobility Comments: Pt was not using an AD she was intermittently ambulating with furniture walking. Pt has had 1 fall in 6 months. Spouse intermittently provides a little assist for sit to stands. ADLs Comments: Pt spouse provides set up assist and Min-Mod A with cues for dressing secondary to impaired cognition. Husband states, pt often puts clothes on backward or inside out without assistance, but is typically Independent to Mod I with bathing, toileting, self feeding, and grooming. Husband assists with IADLs.        OT Problem List: Decreased strength;Decreased activity tolerance;Impaired balance (sitting and/or standing);Cardiopulmonary status limiting activity      OT Treatment/Interventions: Self-care/ADL training;Therapeutic exercise;DME and/or AE instruction;Therapeutic activities;Patient/family education;Balance training    OT Goals(Current goals can be found in the care plan section) Acute Rehab OT Goals Patient Stated Goal: To return home with husband and feel better OT Goal Formulation: With patient/family Time For Goal Achievement: 08/10/23 Potential to Achieve Goals: Good ADL Goals Pt Will Perform Upper Body Bathing: with supervision;sitting (with cues for sequencing and safety as needed) Pt Will Perform Lower Body Bathing: sitting/lateral leans;sit to/from stand;with min guard assist (with cues for sequencing and safety as needed) Pt Will Perform Lower Body Dressing: sit to/from stand;sitting/lateral leans;with min guard assist (with cues for sequencing and safety as needed) Pt Will Transfer to Toilet: with min guard assist;ambulating;bedside commode (with least restrictive AD and with cues for sequencing and safety as needed) Pt Will Perform Toileting - Clothing Manipulation and hygiene: with min guard assist;sitting/lateral leans;sit to/from stand (with cues for sequencing and safety as  needed) Pt/caregiver will Perform Home Exercise Program: Increased strength;Both right and left upper extremity;With theraband;With theraputty;With minimal assist;With written HEP provided (increased activity tolerance)  OT Frequency: Min 1X/week    Co-evaluation PT/OT/SLP Co-Evaluation/Treatment: Yes Reason for Co-Treatment: Complexity of the patient's impairments (multi-system involvement);For patient/therapist safety;To address functional/ADL transfers PT goals addressed during session: Mobility/safety with mobility;Balance OT goals addressed during session: ADL's and self-care      AM-PAC OT "6 Clicks" Daily Activity     Outcome Measure Help from another person eating meals?: A Little Help from another person taking care of personal grooming?: A Little Help from another person toileting, which includes using toliet, bedpan, or urinal?: A Lot Help from another person bathing (including washing, rinsing, drying)?: A Lot Help from another person to put on and taking off regular upper body clothing?: A Little Help from another person to put on and taking off regular lower body clothing?: A Lot 6 Click Score: 15   End of Session Nurse Communication: Mobility status;Other (comment) (Episode of orthostatic hypotension; pt with episode of bowel incontinence; pt needing new PureWick)  Activity Tolerance: Patient tolerated treatment well;Treatment limited secondary to medical complications (Comment) (limited due to episode of orthostatic hypotension) Patient left: in bed;with call bell/phone within reach;with bed alarm set;with family/visitor present  OT Visit Diagnosis: Unsteadiness on feet (R26.81);Other abnormalities of gait and mobility (R26.89);Muscle weakness (generalized) (M62.81);Other symptoms and signs involving cognitive function                Time: 5621-3086 OT Time Calculation (min): 44 min Charges:  OT General Charges $OT Visit: 1 Visit OT Evaluation $OT Eval Low Complexity:  1 Low OT Treatments $Self Care/Home Management : 23-37 mins   "Orson Eva., OTR/L, MA Acute Rehab [phone]   Lendon Colonel 07/27/2023, 1:14 PM

## 2023-07-27 NOTE — Progress Notes (Signed)
ANTICOAGULATION CONSULT NOTE - Initial Consult  Pharmacy Consult for heparin Indication: atrial fibrillation  Allergies  Allergen Reactions   Morphine And Codeine Hives   Prevacid [Lansoprazole] Other (See Comments)    unknown   Provigil [Modafinil] Other (See Comments)    dizziness   Adhesive [Tape] Rash    bandaids     Patient Measurements: Height: 5\' 10"  (177.8 cm) Weight: 55.8 kg (123 lb 0.3 oz) IBW/kg (Calculated) : 68.5 Heparin Dosing Weight: 56.2  Vital Signs: Temp: 98.4 F (36.9 C) (08/02 0400) Temp Source: Axillary (08/02 0400) BP: 135/83 (08/02 0400) Pulse Rate: 105 (08/02 0400)  Labs: Recent Labs    07/25/23 0308 07/25/23 0443 07/25/23 0636 07/25/23 0958 07/25/23 1533 07/25/23 1745 07/26/23 0559 07/27/23 0146 07/27/23 0852  HGB 10.6*  --   --   --   --   --  10.5* 10.0*  --   HCT 33.1*  --   --   --   --   --  32.8* 30.4*  --   PLT 301  --   --   --   --   --  283 287  --   LABPROT  --   --  15.4*  --   --   --   --   --   --   INR  --   --  1.2  --   --   --   --   --   --   CREATININE 1.04*  --   --   --   --   --  0.84 0.84  --   TROPONINIHS 30*   < >  --    < > 42* 41*  --   --  26*   < > = values in this interval not displayed.    Estimated Creatinine Clearance: 50.2 mL/min (by C-G formula based on SCr of 0.84 mg/dL).   Medical History: Past Medical History:  Diagnosis Date   Back pain    Cancer (HCC)    Skin, basal cell   Dyspnea    Fatigue    GERD (gastroesophageal reflux disease)    Headache(784.0)    history of migraines   History of kidney stones    Hypercholesteremia    Hypertension    Leg swelling    Bilateral   Occasional tremors    mild hand tremors   Osteoarthritis    PONV (postoperative nausea and vomiting)    RBD (REM behavioral disorder)    Rheumatoid arthritis(714.0)    Scoliosis of lumbar spine    Spinal stenosis    Multiple levels   Tic [place]    Trigeminal neuralgia 03/30/2015   Vision abnormalities      Medications:  Medications Prior to Admission  Medication Sig Dispense Refill Last Dose   acetaminophen-codeine (TYLENOL #3) 300-30 MG tablet Take 1-2 tablets by mouth every 6 (six) hours as needed (for pain). 30 tablet 0 07/24/2023   amLODipine-olmesartan (AZOR) 5-40 MG tablet Take 1 tablet by mouth every evening.    07/24/2023   clonazePAM (KLONOPIN) 1 MG tablet TAKE 1 TABLET BY MOUTH AT BEDTIME (Patient taking differently: Take 1 mg by mouth at bedtime.) 30 tablet 5 07/24/2023   Coenzyme Q10 (CO Q 10 PO) Take 1 Dose by mouth daily.   07/24/2023   Docusate Calcium (STOOL SOFTENER PO) Take 1 tablet by mouth daily.   07/24/2023   famotidine (PEPCID) 40 MG tablet Take 40 mg by mouth daily as  needed for heartburn.   Past Week   loratadine (CLARITIN) 10 MG tablet Take 10 mg by mouth daily.   07/24/2023   mometasone (NASONEX) 50 MCG/ACT nasal spray Place 1 spray into the nose daily as needed (CONGESTION).    07/24/2023   montelukast (SINGULAIR) 10 MG tablet Take 10 mg by mouth at bedtime.   07/24/2023   omeprazole (PRILOSEC) 20 MG capsule Take 20 mg by mouth daily.    07/24/2023   ondansetron (ZOFRAN ODT) 8 MG disintegrating tablet Take 1 tablet (8 mg total) by mouth every 8 (eight) hours as needed for nausea or vomiting. 10 tablet 0 Past Week   Oxcarbazepine (TRILEPTAL) 300 MG tablet TAKE 1 TABLET BY MOUTH IN THE MORNING AND 1 AT NOON AND 2 AT BEDTIME (Patient taking differently: Take 300 mg by mouth daily.) 360 tablet 3 07/24/2023   pravastatin (PRAVACHOL) 40 MG tablet Take 40 mg by mouth daily before breakfast.    07/24/2023   sertraline (ZOLOFT) 50 MG tablet Take 1 tablet by mouth once daily 90 tablet 2 07/24/2023   valACYclovir (VALTREX) 500 MG tablet Take 1,000 mg by mouth every 12 (twelve) hours as needed (for fever blisters).   Past Week   benzonatate (TESSALON) 100 MG capsule Take 1 capsule (100 mg total) by mouth every 8 (eight) hours. (Patient not taking: Reported on 07/25/2023) 21 capsule 0 Not  Taking   doxycycline (VIBRAMYCIN) 100 MG capsule Take 1 capsule (100 mg total) by mouth 2 (two) times daily. (Patient not taking: Reported on 07/25/2023) 20 capsule 0 Completed Course   ergocalciferol (VITAMIN D2) 1.25 MG (50000 UT) capsule ergocalciferol (vitamin D2) 1,250 mcg (50,000 unit) capsule  TAKE 1 CAPSULE BY MOUTH ONCE A WEEK FOR 12 WEEKS (Patient not taking: Reported on 05/31/2023)   Not Taking   etanercept (ENBREL SURECLICK) 50 MG/ML injection 1 injection (Patient not taking: Reported on 07/25/2023)   Not Taking   lamoTRIgine (LAMICTAL) 150 MG tablet Take 1 tablet by mouth once daily (Patient not taking: Reported on 07/25/2023) 90 tablet 2 Not Taking   pyridostigmine (MESTINON) 60 MG tablet TAKE 2 TO 3 TABS A DAY (Patient not taking: Reported on 07/25/2023) 270 tablet 2 Not Taking   Scheduled:   amiodarone  200 mg Oral BID   amLODipine  5 mg Oral Daily   aspirin  81 mg Oral Daily   clonazePAM  1 mg Oral QHS   colchicine  0.6 mg Oral BID   furosemide  20 mg Oral Daily   heparin  2,000 Units Intravenous Once   irbesartan  300 mg Oral Daily   lamoTRIgine  150 mg Oral Daily   methIMAzole  10 mg Oral Daily   metoprolol tartrate  25 mg Oral BID   montelukast  10 mg Oral QHS   Oxcarbazepine  300 mg Oral BID   pantoprazole  40 mg Oral Daily   potassium chloride  40 mEq Oral BID   pravastatin  40 mg Oral QAC breakfast   pyridostigmine  60 mg Oral Q8H   sertraline  50 mg Oral Daily   sodium chloride flush  3 mL Intravenous Q12H   spironolactone  25 mg Oral Daily    Assessment: Patient is a 13 yof admitted for chest pain and new onset afib. Patient found to have pericardial effusion/pericarditis. She had thoracentesis yesterday with 1.3L removed. Repeat thoracentesis no longer needed, will initiate heparin infusion for her afib per Dr. Thedore Mins.   CBC stable  Goal of  Therapy:  Heparin level 0.3-0.7 units/ml Monitor platelets by anticoagulation protocol: Yes   Plan:  Give 2000 units  bolus x 1 Start heparin infusion at 800 units/hr Check Heparin level in 8 hours and daily while on heparin Continue to monitor H&H and platelets     07/27/2023,10:04 AM

## 2023-07-28 ENCOUNTER — Inpatient Hospital Stay (HOSPITAL_COMMUNITY): Payer: Medicare PPO

## 2023-07-28 DIAGNOSIS — J90 Pleural effusion, not elsewhere classified: Secondary | ICD-10-CM | POA: Diagnosis not present

## 2023-07-28 LAB — HEPARIN LEVEL (UNFRACTIONATED)
Heparin Unfractionated: 0.22 IU/mL — ABNORMAL LOW (ref 0.30–0.70)
Heparin Unfractionated: 0.42 IU/mL (ref 0.30–0.70)

## 2023-07-28 MED ORDER — HEPARIN BOLUS VIA INFUSION
2000.0000 [IU] | Freq: Once | INTRAVENOUS | Status: AC
  Administered 2023-07-28: 2000 [IU] via INTRAVENOUS
  Filled 2023-07-28: qty 2000

## 2023-07-28 NOTE — Progress Notes (Signed)
Cardiology :  followed at [facility]  Subjective:  Breathing better   Remains in sinus rhythm.  Objective:  Vitals:   07/27/23 2000 07/27/23 2313 07/28/23 0431 07/28/23 0900  BP: 128/77 (!) 121/58 131/63 125/62  Pulse: 74 74 72   Resp: 16 20 19    Temp: 98.2 F (36.8 C) 98.2 F (36.8 C) 97.6 F (36.4 C) 97.9 F (36.6 C)  TempSrc: Oral Oral Oral Oral  SpO2: 98% 96% 96%   Weight:      Height:        Intake/Output from previous day: No intake or output data in the 24 hours ending 07/28/23 6045   Physical Exam:  Frail elderly female Decreased BS mid/base right ? 2 component friction rub with SEM Abdomen benign Trace edema  Lab Results: Basic Metabolic Panel: Recent Labs    07/27/23 0146 07/28/23 0403  NA 130* 131*  K 3.5 3.7  CL 98 101  CO2 21* 21*  GLUCOSE 101* 104*  BUN 21 20  CREATININE 0.84 0.95  CALCIUM 8.4* 8.1*  MG 1.7 2.2   Liver Function Tests: Recent Labs    07/27/23 0146 07/28/23 0403  AST 11* 8*  ALT 12 12  ALKPHOS 104 108  BILITOT 0.6 <0.1*  PROT 5.3* 5.0*  ALBUMIN 2.1* 1.9*   No results for input(s): "LIPASE", "AMYLASE" in the last 72 hours. CBC: Recent Labs    07/27/23 0146 07/28/23 0403  WBC 10.0 8.9  NEUTROABS 7.1 6.1  HGB 10.0* 9.3*  HCT 30.4* 28.7*  MCV 84.0 82.5  PLT 287 282   Cardiac Enzymes: No results for input(s): "CKTOTAL", "CKMB", "CKMBINDEX", "TROPONINI" in the last 72 hours. BNP: Invalid input(s): "POCBNP" D-Dimer: No results for input(s): "DDIMER" in the last 72 hours.  Hemoglobin A1C: Recent Labs    07/26/23 0600  HGBA1C 5.2   Fasting Lipid Panel: Recent Labs    07/26/23 0600  CHOL 144  HDL 55  LDLCALC 74  TRIG 76  CHOLHDL 2.6   Thyroid Function Tests: Recent Labs    07/26/23 0600 07/26/23 1406  TSH <0.010*  --   T3FREE  --  1.7*   Anemia Panel: No results for input(s): "VITAMINB12", "FOLATE", "FERRITIN", "TIBC", "IRON", "RETICCTPCT" in the last 72 hours.  Imaging: US  THYROID  Result Date: 07/27/2023 CLINICAL DATA:  Hyperthyroidism. EXAM: THYROID ULTRASOUND TECHNIQUE: Ultrasound examination of the thyroid gland and adjacent soft tissues was performed. COMPARISON:  None Available. FINDINGS: Parenchymal Echotexture: Mildly heterogenous Isthmus: 0.2 cm Right lobe: 5.1 x 2.0 x 1.7 cm Left lobe: 4.4 x 1.6 x 1.6 cm _________________________________________________________ Estimated total number of nodules >/= 1 cm: 2 Number of spongiform nodules >/=  2 cm not described below (TR1): 0 Number of mixed cystic and solid nodules >/= 1.5 cm not described below (TR2): 0 _________________________________________________________ Nodule #1 is a hyperechoic nodule in the right inferior thyroid lobe. This nodule measures up to 0.6 cm and does not meet criteria for biopsy or dedicated follow-up. Nodule # 2: Location: Left; Superior Maximum size: 1.0 cm; Other 2 dimensions: 0.4 x 0.7 cm Composition: solid/almost completely solid (2) Echogenicity: hyperechoic (1) Shape: not taller-than-wide (0) Margins: ill-defined (0) Echogenic foci: none (0) ACR TI-RADS total points: 3. ACR TI-RADS risk category: TR3 (3 points). ACR TI-RADS recommendations: Given size (<1.4 cm) and appearance, this nodule does NOT meet TI-RADS criteria for biopsy or dedicated follow-up. _________________________________________________________ Nodule # 3: Location: Left; Inferior Maximum size: 1.0 cm; Other 2 dimensions: 0.6 x 0.6  cm Composition: solid/almost completely solid (2) Echogenicity: hyperechoic (1) Shape: not taller-than-wide (0) Margins: ill-defined (0) Echogenic foci: none (0) ACR TI-RADS total points: 3. ACR TI-RADS risk category: TR3 (3 points). ACR TI-RADS recommendations: Given size (<1.4 cm) and appearance, this nodule does NOT meet TI-RADS criteria for biopsy or dedicated follow-up. _________________________________________________________ Thyroid tissue is hypervascular. IMPRESSION: 1. Thyroid tissue is  hypervascular and mildly heterogeneous. 2. Bilateral thyroid nodules that do not meet criteria for biopsy or dedicated follow-up. The above is in keeping with the ACR TI-RADS recommendations - J Am Coll Radiol 2017;14:587-595. Electronically Signed   By: Richarda Overlie M.D.   On: 07/27/2023 07:38   DG Chest Port 1 View  Result Date: 07/27/2023 CLINICAL DATA:  Shortness of breath.  Recent pleural effusion. EXAM: PORTABLE CHEST 1 VIEW COMPARISON:  07/26/23 FINDINGS: Normal heart size. Right pleural effusion is again noted. There is been interval worsening aeration to the right mid and right lower lung which is favored to represent interval reaccumulation of right pleural effusion with progressive veil like opacification of the right lower lung. Cannot exclude superimposed airspace disease or atelectasis. Scar like opacities noted in the right upper lobe. IMPRESSION: Interval worsening aeration to the right mid and right lower lung which is favored to represent interval reaccumulation of right pleural effusion with progressive veil like opacification of the right lower lung. Cannot exclude superimposed airspace disease or atelectasis. Electronically Signed   By: Signa Kell M.D.   On: 07/27/2023 07:03   VAS [place] LOWER EXTREMITY VENOUS (DVT)  Result Date: 07/26/2023  Lower Venous DVT Study Patient Name:  Kristen Hansen  Date of Exam:   07/26/2023 Medical Rec #: [id number]      Accession #:    [id number] Date of Birth: 09/08/1946      Patient Gender: F Patient Age:   77 years Exam Location:  [facility] Procedure:      VAS [place] LOWER EXTREMITY VENOUS (DVT) Referring Phys: Bess Harvest [facility] --------------------------------------------------------------------------------  Indications: Swelling, and "rapidly rising D-Dimer".  Limitations: Body habitus and poor ultrasound/tissue interface. Comparison Study: No previous study. Performing Technologist: McKayla Maag RVT, [place]  Examination Guidelines: A complete evaluation includes  B-mode imaging, spectral Doppler, color Doppler, and power Doppler as needed of all accessible portions of each vessel. Bilateral testing is considered an integral part of a complete examination. Limited examinations for reoccurring indications may be performed as noted. The reflux portion of the exam is performed with the patient in reverse Trendelenburg.  +---------+---------------+---------+-----------+----------+--------------+ RIGHT    CompressibilityPhasicitySpontaneityPropertiesThrombus Aging +---------+---------------+---------+-----------+----------+--------------+ CFV      Full           Yes      Yes                                 +---------+---------------+---------+-----------+----------+--------------+ SFJ      Full                                                        +---------+---------------+---------+-----------+----------+--------------+ FV Prox  Full                                                        +---------+---------------+---------+-----------+----------+--------------+  FV Mid   Full                                                        +---------+---------------+---------+-----------+----------+--------------+ FV DistalFull                                                        +---------+---------------+---------+-----------+----------+--------------+ PFV      Full                                                        +---------+---------------+---------+-----------+----------+--------------+ POP      Full           Yes      Yes                                 +---------+---------------+---------+-----------+----------+--------------+ PTV      Full                                                        +---------+---------------+---------+-----------+----------+--------------+ PERO     Full                                                         +---------+---------------+---------+-----------+----------+--------------+   +---------+---------------+---------+-----------+----------+--------------+ LEFT     CompressibilityPhasicitySpontaneityPropertiesThrombus Aging +---------+---------------+---------+-----------+----------+--------------+ CFV      Full           Yes      Yes                                 +---------+---------------+---------+-----------+----------+--------------+ SFJ      Full                                                        +---------+---------------+---------+-----------+----------+--------------+ FV Prox  Full                                                        +---------+---------------+---------+-----------+----------+--------------+ FV Mid   Full                                                        +---------+---------------+---------+-----------+----------+--------------+  FV DistalFull                                                        +---------+---------------+---------+-----------+----------+--------------+ PFV      Full                                                        +---------+---------------+---------+-----------+----------+--------------+ POP                                                   Not visualized +---------+---------------+---------+-----------+----------+--------------+ PTV      Full                                                        +---------+---------------+---------+-----------+----------+--------------+ PERO     Full                                         limited        +---------+---------------+---------+-----------+----------+--------------+     Summary: RIGHT: - There is no evidence of deep vein thrombosis in the lower extremity.  - No cystic structure found in the popliteal fossa.  LEFT: - There is no evidence of deep vein thrombosis in the lower extremity. However, portions of this examination were limited-  see technologist comments above.  - No cystic structure found in the popliteal fossa.  *See table(s) above for measurements and observations. Electronically signed by Sherald Hess MD on 07/26/2023 at 5:37:26 PM.    Final    ECHOCARDIOGRAM COMPLETE  Result Date: 07/26/2023    ECHOCARDIOGRAM REPORT   Patient Name:   Kristen Hansen Date of Exam: 07/26/2023 Medical Rec #:  [id number]     Height:       70.0 in Accession #:    [id number]    Weight:       124.0 lb Date of Birth:  April 02, 1946     BSA:          1.704 m Patient Age:    77 years      BP:           132/80 mmHg Patient Gender: F             HR:           73 bpm. Exam Location:  Inpatient Procedure: 2D Echo, Cardiac Doppler and Color Doppler Indications:    CHF-Acute Diastolic I50.31  History:        Patient has no prior history of Echocardiogram examinations.                 CHF, Signs/Symptoms:Hypotension and Chest Pain; Risk                 Factors:Hypertension and Dyslipidemia. CKD, stage 3.  Sonographer:    Lucendia Herrlich Referring Phys: Effie Shy PRASHANT [facility] IMPRESSIONS  1. Distal septal apical hypokinesis . Left ventricular ejection fraction, by estimation, is 50 to 55%. The left ventricle has low normal function. The left ventricle has no regional wall motion abnormalities. There is mild left ventricular hypertrophy of the basal and septal segments. Left ventricular diastolic parameters are indeterminate.  2. Right ventricular systolic function is normal. The right ventricular size is normal.  3. A small pericardial effusion is present. The pericardial effusion is anterior to the right ventricle.  4. The mitral valve is degenerative. Mild mitral valve regurgitation. No evidence of mitral stenosis. Severe mitral annular calcification.  5. Tricuspid valve regurgitation is mild to moderate.  6. The aortic valve is tricuspid. There is moderate calcification of the aortic valve. There is moderate thickening of the aortic valve. Aortic valve regurgitation is  trivial. Aortic valve sclerosis is present, with no evidence of aortic valve stenosis.  7. The inferior vena cava is normal in size with greater than 50% respiratory variability, suggesting right atrial pressure of 3 mmHg. FINDINGS  Left Ventricle: Distal septal apical hypokinesis. Left ventricular ejection fraction, by estimation, is 50 to 55%. The left ventricle has low normal function. The left ventricle has no regional wall motion abnormalities. The left ventricular internal cavity size was normal in size. There is mild left ventricular hypertrophy of the basal and septal segments. Left ventricular diastolic parameters are indeterminate. Right Ventricle: The right ventricular size is normal. No increase in right ventricular wall thickness. Right ventricular systolic function is normal. Left Atrium: Left atrial size was normal in size. Right Atrium: Right atrial size was normal in size. Pericardium: A small pericardial effusion is present. The pericardial effusion is anterior to the right ventricle. Mitral Valve: The mitral valve is degenerative in appearance. There is moderate thickening of the mitral valve leaflet(s). There is moderate calcification of the mitral valve leaflet(s). Severe mitral annular calcification. Mild mitral valve regurgitation. No evidence of mitral valve stenosis. MV peak gradient, 10.4 mmHg. The mean mitral valve gradient is 3.0 mmHg. Tricuspid Valve: The tricuspid valve is normal in structure. Tricuspid valve regurgitation is mild to moderate. No evidence of tricuspid stenosis. Aortic Valve: The aortic valve is tricuspid. There is moderate calcification of the aortic valve. There is moderate thickening of the aortic valve. Aortic valve regurgitation is trivial. Aortic valve sclerosis is present, with no evidence of aortic valve  stenosis. Aortic valve peak gradient measures 4.8 mmHg. Pulmonic Valve: The pulmonic valve was normal in structure. Pulmonic valve regurgitation is mild. No  evidence of pulmonic stenosis. Aorta: The aortic root is normal in size and structure. Venous: The inferior vena cava is normal in size with greater than 50% respiratory variability, suggesting right atrial pressure of 3 mmHg. IAS/Shunts: No atrial level shunt detected by color flow Doppler.  LEFT VENTRICLE PLAX 2D LVIDd:         3.10 cm   Diastology LVIDs:         1.90 cm   LV e' medial:    4.51 cm/s LV PW:         1.30 cm   LV E/e' medial:  26.2 LV IVS:        1.00 cm   LV e' lateral:   4.32 cm/s LVOT diam:     1.90 cm   LV E/e' lateral: 27.3 LV SV:         48 LV SV Index:   28  LVOT Area:     2.84 cm  RIGHT VENTRICLE             IVC RV S prime:     18.40 cm/s  IVC diam: 1.60 cm TAPSE (M-mode): 2.0 cm LEFT ATRIUM             Index        RIGHT ATRIUM           Index LA diam:        3.60 cm 2.11 cm/m   RA Area:     12.90 cm LA Vol (A2C):   64.7 ml 37.98 ml/m  RA Volume:   29.60 ml  17.38 ml/m LA Vol (A4C):   67.5 ml 39.62 ml/m LA Biplane Vol: 73.2 ml 42.97 ml/m  AORTIC VALVE AV Area (Vmax): 2.36 cm AV Vmax:        109.00 cm/s AV Peak Grad:   4.8 mmHg LVOT Vmax:      90.80 cm/s LVOT Vmean:     58.367 cm/s LVOT VTI:       0.171 m  AORTA Ao Root diam: 3.30 cm Ao Asc diam:  2.80 cm MITRAL VALVE                TRICUSPID VALVE MV Area (PHT): 3.85 cm     TR Peak grad:   43.6 mmHg MV Area VTI:   1.12 cm     TR Vmax:        330.00 cm/s MV Peak grad:  10.4 mmHg MV Mean grad:  3.0 mmHg     SHUNTS MV Vmax:       1.61 m/s     Systemic VTI:  0.17 m MV Vmean:      77.2 cm/s    Systemic Diam: 1.90 cm MV Decel Time: 197 msec MV E velocity: 118.00 cm/s MV A velocity: 107.00 cm/s MV E/A ratio:  1.10 Charlton Haws MD Electronically signed by Charlton Haws MD Signature Date/Time: 07/26/2023/2:19:09 PM    Final    MR SHOULDER LEFT WO CONTRAST  Result Date: 07/26/2023 CLINICAL DATA:  Chronic left shoulder pain. Rotator cuff disorder suspected. EXAM: MRI OF THE LEFT SHOULDER WITHOUT CONTRAST TECHNIQUE: Multiplanar, multisequence  MR imaging of the shoulder was performed. No intravenous contrast was administered. COMPARISON:  None Available. FINDINGS: Despite efforts by the technologist and patient, motion artifact is present on today's exam and could not be eliminated. This reduces exam sensitivity and specificity. Rotator cuff: Within the limitations of patient motion artifact, there appears to be mild supraspinatus and infraspinatus tendinosis. No definite tendon tear. There is a focus of decreased T1 increased T2 signal cystic change versus focal marrow edema within the humeral head just deep to the slightly anterior aspect of the supraspinatus tendon footprint measuring up to 12 x 9 mm (AP by craniocaudal). Mild superior subscapularis intermediate T2 signal tendinosis. The teres minor is intact. Muscles:  Moderate supraspinatus muscle atrophy. Biceps long head: The intra-articular long head of the biceps tendon is intact. Acromioclavicular Joint: There are mild degenerative changes of the acromioclavicular joint including joint space narrowing and peripheral osteophytosis. Type III acromion with mild downsloping of the anterolateral acromion. Mild fluid within the subacromial/subdeltoid bursa. Glenohumeral Joint: Moderate to severe glenohumeral cartilage thinning. Moderate inferior humeral head-neck junction degenerative osteophytosis. Mild glenoid fossa and medial humeral head cortical flattening/remodeling. Labrum: Mild-to-moderate attenuation and degenerative irregularity of the posterior glenoid labrum. Bones:  No acute fracture. Other: Mild joint fluid extends into the  subcoracoid recess in this region there is a 15 x 12 x 10 mm low signal loose body (sagittal series 6, image 18, coronal series 4, image 14, and axial series 2, image 11). Within the inferior axillary pouch there is marrow fat intensity likely loose body measuring up to 2.2 cm (sagittal series 7, image 12). IMPRESSION: 1. Motion artifact technically limits this  examination. 2. 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 3. Mild supraspinatus, infraspinatus, and superior subscapularis tendinosis. No definite rotator cuff tear. 4. Mild degenerative changes of the acromioclavicular joint. Type III acromion with mild downsloping of the anterolateral acromion. 5. Mild subacromial/subdeltoid bursitis. 6. Multiple loose bodies as above. Electronically Signed   By: Neita Garnet M.D.   On: 07/26/2023 13:58   IR THORACENTESIS ASP PLEURAL SPACE W/IMG GUIDE  Result Date: 07/26/2023 INDICATION: Patient with a history of heart failure presents today with new onset right pleural effusion. Interventional Radiology asked to perform a diagnostic and therapeutic thoracentesis. EXAM: ULTRASOUND GUIDED THORACENTESIS MEDICATIONS: 1% lidocaine 10 ml COMPLICATIONS: None immediate. PROCEDURE: An ultrasound guided thoracentesis was thoroughly discussed with the patient and questions answered. The benefits, risks, alternatives and complications were also discussed. The patient understands and wishes to proceed with the procedure. Written consent was obtained. Ultrasound was performed to localize and mark an adequate pocket of fluid in the right chest. The area was then prepped and draped in the normal sterile fashion. 1% Lidocaine was used for local anesthesia. Under ultrasound guidance a 6 Fr Safe-T-Centesis catheter was introduced. Thoracentesis was performed. The catheter was removed and a dressing applied. FINDINGS: A total of approximately 1.3 L of clear yellow fluid was removed. Samples were sent to the laboratory as requested by the clinical team. IMPRESSION: Successful ultrasound guided right thoracentesis yielding 1.3 L of pleural fluid. Procedure performed by: Alwyn Ren, NP and supervised by Dr. Fredia Sorrow Electronically Signed   By: Irish Lack M.D.   On: 07/26/2023 13:48   DG Chest 1 View  Result Date: 07/26/2023 CLINICAL DATA:  Status post thoracentesis EXAM: CHEST  1  VIEW COMPARISON:  07/25/2023 FINDINGS: Diminished fluid volume of a loculated lateral and basal right hydropneumothorax, with moderate, layering persistent fluid volume and associated pleural thickening. Left lung is normally aerated. Heart and mediastinum are normal. IMPRESSION: Diminished fluid volume of a loculated lateral and basal right hydropneumothorax, with moderate, layering persistent fluid volume and associated pleural thickening. Electronically Signed   By: Jearld Lesch M.D.   On: 07/26/2023 13:10    Cardiac Studies:  ECG: afib LAD poor R wave progression ST elevation resolved   Telemetry:afib rate 80-90   Echo: EF 50-55% distal septal apical hypokinesis   Medications:    amiodarone  200 mg Oral BID   aspirin  81 mg Oral Daily   clonazePAM  1 mg Oral QHS   colchicine  0.6 mg Oral BID   irbesartan  75 mg Oral Daily   lamoTRIgine  150 mg Oral Daily   methIMAzole  10 mg Oral Daily   metoprolol tartrate  25 mg Oral BID   montelukast  10 mg Oral QHS   Oxcarbazepine  300 mg Oral BID   pantoprazole  40 mg Oral Daily   potassium chloride  40 mEq Oral BID   pravastatin  40 mg Oral QAC breakfast   pyridostigmine  60 mg Oral Q8H   sertraline  50 mg Oral Daily   sodium chloride flush  3 mL Intravenous Q12H   spironolactone  12.5 mg Oral  Daily      heparin 1,150 Units/hr (07/28/23 0906)    Assessment/Plan:  Chest Pain Patient has a history of chest pain and dyspnea on exertion. Myoview in 12/2022 was abnormal with a perfusion defect of the anterior wall consistent with ischemia. Cardiac catheterization was discussed but patient declined initially and preferred medical therapy. Presents with clinical picture pleuro pericarditis with flat troponin and transient diffuse ST elevation. Small pericardial effusion on TTE with RWMA in septum and apex Plan on right /left cath Monday .   Chronic Diastolic CHF Echo in 10/2022 showed LVEF of 65-70% with mild MR/moderate MS, mild TR, and  moderate pulmonary hypertension. BNP elevated this admission at 733 >> 1,109. Chest x-ray showed a large right pleural effusion but no overt pulmonary edema. Does not appear grossly volume overloaded on exam. Continue home Spironolactone 25mg  daily. Per review of Care Everywhere, also looks like she has been on Lasix 20mg  daily. This was restarted but again stopped this admission. I think gentle diuresis is appropriate.   New Onset Atrial Fibrillation Recurrent episodes this admission.  Maintaining sinus on PO amiodarone CHADVASC 6 - on heparin; may switch to eliquis after interventions complete   Right Pleural Effusion Large right pleural effusion. S/p thoracentesis 07/26/23 with removal of 1.3 L of fluid. CXR this AM to my read with some re expansion atelectasis and small to moderate residual effusion s/p thoracentesis    Mitral Stenosis Echo in 10/2023 at [facility] showed moderate mitral stenosis and mild mitral regurgitation. Can reassess on repeat Echo which has already been ordered.   Hypertension BP markedly elevated on admission and as high as 215/99. However, has since improved. Continue Amlodipine 5mg  daily and Irbesartan 300mg  daily (on Amlodipine-Olmesartan 5-40mg  daily at home). Continue Spironolactone 25mg  daily. Continue Lopressor to 25mg  twice daily.  Pericarditis:  transient diffuse ECG changes small effusion on TTE in association with pleuritis and pleural effusion ESR 54 CRP 15.6 Colchicine started ASA avoid NSAID's with need for anticoagulation Consider 20 panel resp for viral etiology   Otherwise, per primary team: - Hyperthyroidism (recent started on Methimazole) - GERD - Sjogren's syndrome - Rheumatoid arthritis - Chronic back pain - Lewy-body dementia - Hyperlipidemia  Roberts Gaudy  07/28/2023, 9:17 AM

## 2023-07-28 NOTE — Progress Notes (Signed)
ANTICOAGULATION CONSULT NOTE - Follow Up Consult  Pharmacy Consult for Heparin Indication: atrial fibrillation  Allergies  Allergen Reactions   Morphine And Codeine Hives   Prevacid [Lansoprazole] Other (See Comments)    unknown   Provigil [Modafinil] Other (See Comments)    dizziness   Adhesive [Tape] Rash    bandaids     Patient Measurements: Height: 5\' 10"  (177.8 cm) Weight: 55.8 kg (123 lb 0.3 oz) IBW/kg (Calculated) : 68.5 Heparin Dosing Weight: 56 kg  Vital Signs: Temp: 98.2 F (36.8 C) (08/03 1600) Temp Source: Oral (08/03 1600) BP: 144/72 (08/03 2127) Pulse Rate: 70 (08/03 2127)  Labs: Recent Labs    07/26/23 0559 07/27/23 0146 07/27/23 0852 07/27/23 1758 07/28/23 0403 07/28/23 1216 07/28/23 2220  HGB 10.5* 10.0*  --   --  9.3*  --   --   HCT 32.8* 30.4*  --   --  28.7*  --   --   PLT 283 287  --   --  282  --   --   HEPARINUNFRC  --   --   --    < > 0.16* 0.22* 0.42  CREATININE 0.84 0.84  --   --  0.95  --   --   TROPONINIHS  --   --  26*  --   --   --   --    < > = values in this interval not displayed.    Estimated Creatinine Clearance: 44.4 mL/min (by C-G formula based on SCr of 0.95 mg/dL).  Assessment: Patient is a 61 yof admitted for chest pain and new onset afib. Patient found to have pericardial effusion/pericarditis. She had thoracentesis 8/1 with 1.3L removed. Pharmacy consulted for heparin infusion for afib.  Heparin level 0.16 is subtherapeutic on 1000 units/hr. Hgb slight down trend.  No issues with infusion or bleeding per RN.  8/3 PM: heparin level returned at 0.42 on 1300 units/hr (therapeutic). No issues reported with heparin drip or signs/symptoms of bleeding. Hgb from the AM did drop from 10 >9.3-continue to monitor   Goal of Therapy:  Heparin level 0.3-0.7 units/ml Monitor platelets by anticoagulation protocol: Yes   Plan:   Continue heparin drip at 1300 units/hr Confirmatory Heparin level 8hr after increase Daily heparin  level and CBC while on heparin.  Arabella Merles, PharmD. Clinical Pharmacist 07/28/2023 11:21 PM    ADDENDUM -Confirmatory AM heparin level returned therapeutic at 0.51. No issues reported with heparin drip or signs/symptoms of bleeding. Hgb 9.3>10, plts 282>306 -Continue current rate of heparin at 1300 units/hr -Follow up daily heparin levels  Arabella Merles, PharmD. Clinical Pharmacist 07/29/2023 5:15 AM

## 2023-07-28 NOTE — Progress Notes (Signed)
Physical Therapy Treatment Patient Details Name: Kristen Hansen MRN: [id number] DOB: [birth year] Today's Date: 07/28/2023   History of Present Illness Pt is 77 yo female presenting to [facility] ED with chest pain. Chest pain has been moving and is increased with palpation. PMH: HTN, hyperlipidemia, Lewy body dementia, Sjogren's syndrome, rheumatoid arthritis, chronic pain, DVT, Spine disease, spinal stenosis, diastolic CHF, depression, REM behavior disorder, orthostatic Hypotension, CKD 3, GERD, hypothrydoidism.    PT Comments  Pt supine in bed.  Performed orthostatic vitals.  She dropped in sitting and standing but able to move OOB to chair this session.  She was incontinent of stool on arrival and required pericare in standing.  Pt continues to be limited due to orthostasis.    BP lying -  130/65 BP sitting - 112/59 BP sitting after seated exercises - 125/66 BP in standing - 113/58      If plan is discharge home, recommend the following: A little help with walking and/or transfers;Assistance with cooking/housework;Assist for transportation;Help with stairs or ramp for entrance   Can travel by private vehicle        Equipment Recommendations  None recommended by PT    Recommendations for Other Services       Precautions / Restrictions Precautions Precautions: Fall Precaution Comments: watch BP Restrictions Weight Bearing Restrictions: No     Mobility  Bed Mobility Overal bed mobility: Needs Assistance Bed Mobility: Supine to Sit     Supine to sit: Min assist     General bed mobility comments: Min assistance to move to edge of bed and scoot forward to allow feet to touch the floor.  Presents with dizziness and drop in pressure moving to seated position.  Pt performed LE exercises and BP improved.    Transfers Overall transfer level: Needs assistance Equipment used: 2 person hand held assist Transfers: Sit to/from Stand Sit to Stand: Min guard           General transfer  comment: Min guard with cues for hand placement and for pushing to seated surface.  Performed additional transfer from chair due to bowel incontinence.    Ambulation/Gait Ambulation/Gait assistance: Min assist Gait Distance (Feet): 5 Feet Assistive device: 1 person hand held assist Gait Pattern/deviations: Trunk flexed, Shuffle       General Gait Details: Performed short distance from bed to chair.   Stairs             Wheelchair Mobility     Tilt Bed    Modified Rankin (Stroke Patients Only)       Balance     Sitting balance-Leahy Scale: Fair       Standing balance-Leahy Scale: Poor                              Cognition Arousal/Alertness: Awake/alert Behavior During Therapy: WFL for tasks assessed/performed Overall Cognitive Status: History of cognitive impairments - at baseline                                          Exercises General Exercises - Lower Extremity Ankle Circles/Pumps: AROM, Both, 15 reps Long Arc Quad: AROM, Both, 10 reps, Seated Hip Flexion/Marching: AROM, Both, 10 reps, Seated Shoulder Exercises Shoulder Flexion: AROM, Both, 10 reps, Seated    General Comments        Pertinent Vitals/Pain  Pain Assessment Pain Assessment: No/denies pain    Home Living                          Prior Function            PT Goals (current goals can now be found in the care plan section) Acute Rehab PT Goals Patient Stated Goal: Return home with spouse Potential to Achieve Goals: Fair Progress towards PT goals: Progressing toward goals    Frequency    Min 1X/week      PT Plan Current plan remains appropriate    Co-evaluation              AM-PAC PT "6 Clicks" Mobility   Outcome Measure  Help needed turning from your back to your side while in a flat bed without using bedrails?: A Little Help needed moving from lying on your back to sitting on the side of a flat bed without using  bedrails?: A Little Help needed moving to and from a bed to a chair (including a wheelchair)?: A Little Help needed standing up from a chair using your arms (e.g., wheelchair or bedside chair)?: A Little Help needed to walk in hospital room?: A Lot Help needed climbing 3-5 steps with a railing? : A Lot 6 Click Score: 16    End of Session Equipment Utilized During Treatment: Gait belt Activity Tolerance: Treatment limited secondary to medical complications (Comment) (continues to get dizzy.)   Nurse Communication: Mobility status PT Visit Diagnosis: Unsteadiness on feet (R26.81);Other abnormalities of gait and mobility (R26.89)     Time: 4098-1191 PT Time Calculation (min) (ACUTE ONLY): 29 min  Charges:    $Therapeutic Exercise: 8-22 mins $Therapeutic Activity: 8-22 mins PT General Charges $$ ACUTE PT VISIT: 1 Visit                     [name] , PTA Acute Rehabilitation Services Office [phone]    [name] 07/28/2023, 2:04 PM

## 2023-07-28 NOTE — Plan of Care (Signed)

## 2023-07-28 NOTE — Consult Note (Signed)
NAME:  Kristen Hansen, MRN:  [id number], DOB:  09/10/1946, LOS: 3 ADMISSION DATE:  07/25/2023, CONSULTATION DATE: 07/27/2023 REFERRING MD: Dr. Thedore Mins, CHIEF COMPLAINT: Pleural effusion  History of Present Illness:  Asked to see for recurrent pleural effusion Recently had a thoracenteses for 1.1 L of exudative fluid  Denies any significant complaints at present  Presented to the hospital with chest pain, shortness of breath with activity  Pertinent  Medical History   Past Medical History:  Diagnosis Date   Back pain    Cancer (HCC)    Skin, basal cell   Dyspnea    Fatigue    GERD (gastroesophageal reflux disease)    Headache(784.0)    history of migraines   History of kidney stones    Hypercholesteremia    Hypertension    Leg swelling    Bilateral   Occasional tremors    mild hand tremors   Osteoarthritis    PONV (postoperative nausea and vomiting)    RBD (REM behavioral disorder)    Rheumatoid arthritis(714.0)    Scoliosis of lumbar spine    Spinal stenosis    Multiple levels   [place]    Trigeminal neuralgia 03/30/2015   Vision abnormalities    Significant Hospital Events: Including procedures, antibiotic start and stop dates in addition to other pertinent events   8/1 thoracentesis 1.3 L  Interim History / Subjective:  No shortness of breath, breathing is comfortable. Hasn't gotten out of bed since she's been here. Says plans to work with PT later today. Husband at bedside  Objective   Blood pressure 125/62, pulse 72, temperature 97.9 F (36.6 C), temperature source Oral, resp. rate 19, height 5\' 10"  (1.778 m), weight 55.8 kg, SpO2 96%.       No intake or output data in the 24 hours ending 07/28/23 1039 Filed Weights   07/25/23 0251 07/27/23 0110  Weight: 56.2 kg 55.8 kg    Examination: General: Elderly lady, thin, no distress Lungs: breath sounds decreased right lung base, otherwise clear, no wheeze Cardiovascular: extra heart sound noted, regular,  systolic murmur Abdomen: soft, nontender Extremities: No clubbing, no edema Neuro: alert and oriented x 4  Chest xray reviewed - persitsent right sided pleural based density with associated atelectasis.   Resolved Hospital Problem list     Assessment & Plan:  Right sided pleural effusion Persistent right sided pleural based density - differential diagnosis is secondary to HF and recent atrial fibrillation vs secondary to pleuropericarditis - mildly exudative based on Light's criteria based on 2/3 ULN; however transudative based on serum LDH>pleural fluid LDH - cytology pending - Fluid is exudative cytology pending - no indication for repeat thoracentesis given patient comfort. Plan to treat underlying etiology (pericarditis.) - however the right sided pleural based density could be loculated pleural effusion requiring chest tube placement. - Will examine with ultrasound to see if chest tube can be safely based at bedside.   Acute on chronic HFpEF - BNP elevated, recent Atrial fibrillation - diuresis as tolerated should help pleural and pericardial effusion  Atrial fibrillation - on amiodarone, herpain gtt, diltiazem   Pericarditis - cardiology following -Continue anti-inflammatories  History of Sjogren's syndrome rheumatoid arthritis - effusion not consistent with RA related pleural effusion, doubt this etiology - incidence of pleural effusion<1%, this is also less likely  Rest per primary team.   Durel Salts, MD Pulmonary and Critical Care Medicine [facility] 07/28/2023 10:45 AM Pager: see AMION  If no response to  pager, please call critical care on call (see AMION) until 7pm After 7:00 pm call Elink

## 2023-07-28 NOTE — Progress Notes (Addendum)
PROGRESS NOTE                                                                                                                                                                                                             Patient Demographics:    Kristen Hansen, is a 77 y.o. female, DOB - 02/17/1946, ZOX:[id number]  Outpatient Primary MD for the patient is Kerin Salen, PA-C    LOS - 3  Admit date - 07/25/2023    Chief Complaint  Patient presents with   Chest Pain       Brief Narrative (HPI from H&P)   Kristen Hansen is a 77 y.o. female with medical history significant of hypertension, hyperlipidemia, Lewy body dementia, Sjogren's syndrome, rheumatoid arthritis, chronic pain, DVT, spinal stenosis, diastolic CHF, depression, REM behavior disorder, orthostatic hypotension, CKD 3, GERD, hyperthyroidism presenting with chest pain. Her chest pain has atypical nature and present for 2 weeks intermittently. She was found to have a moderate pleural effusion. Cardiology was consulted and they are planning a catheterization given her cardiac history.     Subjective:   Patient in bed, appears comfortable, denies any headache, no fever, no chest pain or pressure, no shortness of breath , no abdominal pain. No new focal weakness.    Assessment  & Plan :   Assessment and Plan:  Chest Pain, exudative and recurrent right-sided pleural Effusion, Pericardial Effusion - Pt appears euvolemic on exam. She does have moderate pleural effusion which is increasing in size. This may be contributing to her chest pain as her pain is right sided but states she does have left sided pain as well. She had thoracentesis 07/26/23 with 1.3 L removed. Pleural fluid studies consistent with exudative effusion.  Likely inflammatory due to his underlying Sjogren's seen by pulmonary this admission continue to monitor chest x-ray intermittently.  Will have her  follow-up with pulmonary outpatient along with rheumatology.  Acute on chronic diastolic CHF EF 50 to 55%, mild pericardial effusion and some preceding history of exertional shortness of breath.   has mild pericardial effusion along with exertional chest pain for which cardiology is following and scheduled her for left heart cath on 07/30/2023, for now on colchicine for pericardial effusion along with aspirin per cardiology.  Diuresis as tolerated by blood pressure and electrolytes.  Paroxysmal Atrial Fibrillation: In  Afib this am. Cardiology following. On amiodarone. Will continue heparin gtt.   Hyponatremia: Na 131, Improved from 130 yesterday. Urine studies ordered. Urine osm 682, urine sodium 125 but on lasix. Urine urea pending. Serum osm is 275.   Hypomagnesemia/Hypokalemia: Replete as needed for goal K>4 and Mag>2.  Hx of DVT: D dimer elevated,  CTA negative, lower extremity doppler which was negative for DVT.  Now on heparin drip for A-fib.     Hyperthyroidism: Recently started on methimazole. States she is complaint with this. Will continue here. FT4 normal and FT3 slightly decreased at 1.7. Thyroid ultrasound ordered and shows bilateral nodules that do not meet criteria for biopsy for dedicated follow up.  Will need OP endocrinology follow up.    Left Shoulder Pain: Has good PROM but pain with active ROM. Concern for RT injury. 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 Conservative measures for now.  Overall better outpatient orthopedics follow-up postdischarge.   Dysphagia: OP work up ongoing. EGD without any acute findings. Getting swallowing therapy. SLP consult placed. Placed on Dys 3 Diet here.    HTN: BP 131/63 this am. Will continue home meds: amlodipine 5 mg every day, irbesartan 300 mg every day, lopressor 25 mg BID. Held diuretics given orthostatic positive.    GERD: Continue home med protonix. Does not endorse symptoms of reflux.   Sjogren's Syndrome: Not on any pharmacotherapy.  Likely causing pericardial effusion and exudative right-sided pleural effusion, currently on colchicine for pericardial effusion may require outpatient rheumatology follow-up postdischarge.  RA: Was on enbrel. Last used 2 months ago. Will need rheumatology follow up OP to restart this if needed.   Chronic back pain: Tylenol prn  MDD: Zoloft 50 mg every day. Continued here.   Lewy Body Dementia: On pyridostigmine and clonzapem, Will continue here. Last saw neurologist one month ago  Trigeminal Neuralgia: On lamictal and trileptal. Both continued here.   Obesity: Estimated body mass index is 17.65 kg/m as calculated from the following:   Height as of this encounter: 5\' 10"  (1.778 m).   Weight as of this encounter: 55.8 kg.       Condition - Stable  Family Communication  : Husband updated at bedside 07/26/2023, 07/27/2023, 07/28/2023  Code Status :  Full  Consults  :  Cardiology pulmonary  PUD Prophylaxis : PPI   Procedures  :     Right-sided thoracentesis showing exudative fluid done by IR on 07/26/2023.    Echocardiogram 1. Distal septal apical hypokinesis . Left ventricular ejection fraction, by estimation, is 50 to 55%. The left ventricle has low normal function. The left ventricle has no regional wall motion abnormalities. There is mild left ventricular hypertrophy of the basal and septal segments. Left ventricular diastolic parameters are indeterminate.  2. Right ventricular systolic function is normal. The right ventricular size is normal.  3. A small pericardial effusion is present. The pericardial effusion is anterior to the right ventricle.  4. The mitral valve is degenerative. Mild mitral valve regurgitation. No evidence of mitral stenosis. Severe mitral annular calcification.  5. Tricuspid valve regurgitation is mild to moderate.  6. The aortic valve is  tricuspid. There is moderate calcification of the aortic valve. There is moderate thickening of the aortic valve. Aortic valve regurgitation is trivial. Aortic valve sclerosis is present, with no evidence of aortic valve stenosis.  7. The inferior vena cava is  normal in size with greater than 50% respiratory variability, suggesting right atrial pressure of 3 mmHg  MRI left shoulder - 1. Motion artifact technically limits this examination. 2. 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 3. Mild supraspinatus, infraspinatus, and superior subscapularis tendinosis. No definite rotator cuff tear. 4. Mild degenerative changes of the acromioclavicular joint. Type III acromion with mild downsloping of the anterolateral acromion. 5. Mild subacromial/subdeltoid bursitis. 6. Multiple loose bodies as above.  Lower extremity venous duplex bilateral.  No DVT.  LHC/RHC on 07/30/2023      Disposition Plan  :    Status is: Inpatient Remains inpatient appropriate because: needs continued medical work up including heart cath.   DVT Prophylaxis  :    Place TED hose Start: 07/27/23 1141   Lab Results  Component Value Date   PLT 282 07/28/2023    Diet :  Diet Order             DIET DYS 3 Room service appropriate? Yes; Fluid consistency: Thin  Diet effective now                    Inpatient Medications  Scheduled Meds:  amiodarone  200 mg Oral BID   aspirin  81 mg Oral Daily   clonazePAM  1 mg Oral QHS   colchicine  0.6 mg Oral BID   irbesartan  75 mg Oral Daily   lamoTRIgine  150 mg Oral Daily   methIMAzole  10 mg Oral Daily   metoprolol tartrate  25 mg Oral BID   montelukast  10 mg Oral QHS   Oxcarbazepine  300 mg Oral BID   pantoprazole  40 mg Oral Daily   potassium chloride  40 mEq Oral BID   pravastatin  40 mg Oral QAC breakfast   pyridostigmine  60 mg Oral Q8H   sertraline  50 mg Oral Daily   sodium chloride flush  3 mL Intravenous Q12H   spironolactone  12.5 mg Oral Daily    Continuous Infusions:  heparin 1,000 Units/hr (07/27/23 2113)   PRN Meds:.acetaminophen **OR** acetaminophen, diltiazem, loperamide, nitroGLYCERIN, polyethylene glycol, valACYclovir  Antibiotics  :    Anti-infectives (From admission, onward)    Start     Dose/Rate Route Frequency Ordered Stop   07/26/23 0540  valACYclovir (VALTREX) tablet 1,000 mg        1,000 mg Oral Every 12 hours PRN 07/26/23 0541           Objective:   Vitals:   07/27/23 1115 07/27/23 2000 07/27/23 2313 07/28/23 0431  BP: 132/69 128/77 (!) 121/58 131/63  Pulse: 71 74 74 72  Resp: 20 16 20 19   Temp: 98.3 F (36.8 C) 98.2 F (36.8 C) 98.2 F (36.8 C) 97.6 F (36.4 C)  TempSrc: Oral Oral Oral Oral  SpO2: 99% 98% 96% 96%  Weight:      Height:        Wt Readings from Last 3 Encounters:  07/27/23 55.8 kg  06/11/23 61.7 kg  11/29/22 61.7 kg    No intake or output data in the 24 hours ending 07/28/23 0519   Physical Exam General: NAD HENT: NCAT Lungs: CTAB, no wheeze, rhonchi or rales.  Cardiovascular: NSR. No LE edema Abdomen: No TTP, normal bowel sounds MSK: No asymmetry or muscle atrophy.  Skin: no lesions noted on exposed skin Neuro: Alert and oriented x4. CN grossly intact Psych: Normal mood and normal affect     RN pressure injury documentation: None  Data Review:    Recent Labs  Lab 07/25/23 0308 07/26/23 0559 07/27/23 0146 07/28/23 0403  WBC 12.7* 9.4 10.0 8.9  HGB 10.6* 10.5* 10.0* 9.3*  HCT 33.1* 32.8* 30.4* 28.7*  PLT 301 283 287 282  MCV 82.8 83.5 84.0 82.5  MCH 26.5 26.7 27.6 26.7  MCHC 32.0 32.0 32.9 32.4  RDW 14.4 14.5 14.4 14.4  LYMPHSABS 0.7  --  1.5 1.2  MONOABS 0.9  --  0.9 0.9  EOSABS 0.2  --  0.4 0.7*  BASOSABS 0.0  --  0.0 0.0    Recent Labs  Lab 07/25/23 0308 07/25/23 0636 07/26/23 0559 07/26/23 0600 07/27/23 0146 07/28/23 0403  NA 134*  --  135  --  130* 131*  K 3.9  --  3.8  --  3.5 3.7  CL 102  --  100  --  98 101  CO2 23  --  22   --  21* 21*  ANIONGAP 9  --  13  --  11 9  GLUCOSE 126*  --  92  --  101* 104*  BUN 21  --  17  --  21 20  CREATININE 1.04*  --  0.84  --  0.84 0.95  AST 18  --  13*  --  11* 8*  ALT 13  --  13  --  12 12  ALKPHOS 141*  --  129*  --  104 108  BILITOT 0.4  --  0.5  --  0.6 <0.1*  ALBUMIN 2.8*  --  2.3*  --  2.1* 1.9*  CRP  --   --   --  15.6*  --   --   DDIMER 3.49*  --   --   --   --   --   PROCALCITON  --   --   --  0.18  --   --   INR  --  1.2  --   --   --   --   TSH  --   --   --  <0.010*  --   --   HGBA1C  --   --   --  5.2  --   --   BNP 733.2*  --   --  1,109.0* 928.2* 557.2*  MG  --   --   --  1.8 1.7 2.2  CALCIUM 8.8*  --  8.9  --  8.4* 8.1*      Recent Labs  Lab 07/25/23 0308 07/25/23 0636 07/26/23 0559 07/26/23 0600 07/27/23 0146 07/28/23 0403  CRP  --   --   --  15.6*  --   --   DDIMER 3.49*  --   --   --   --   --   PROCALCITON  --   --   --  0.18  --   --   INR  --  1.2  --   --   --   --   TSH  --   --   --  <0.010*  --   --   HGBA1C  --   --   --  5.2  --   --   BNP 733.2*  --   --  1,109.0* 928.2* 557.2*  MG  --   --   --  1.8 1.7 2.2  CALCIUM 8.8*  --  8.9  --  8.4* 8.1*    Recent Labs  Lab 07/25/23 0308 07/26/23 0559 07/26/23 0600 07/27/23  0146 07/28/23 0403  WBC 12.7* 9.4  --  10.0 8.9  PLT 301 283  --  287 282  CRP  --   --  15.6*  --   --   DDIMER 3.49*  --   --   --   --   PROCALCITON  --   --  0.18  --   --   CREATININE 1.04* 0.84  --  0.84 0.95    ------------------------------------------------------------------------------------------------------------------ Lab Results  Component Value Date   CHOL 144 07/26/2023   HDL 55 07/26/2023   LDLCALC 74 07/26/2023   TRIG 76 07/26/2023   CHOLHDL 2.6 07/26/2023    Lab Results  Component Value Date   HGBA1C 5.2 07/26/2023    Recent Labs    07/26/23 0600 07/26/23 1406  TSH <0.010*  --   T3FREE  --  1.7*    ------------------------------------------------------------------------------------------------------------------ Cardiac Enzymes No results for input(s): "CKMB", "TROPONINI", "MYOGLOBIN" in the last 168 hours.  Invalid input(s): "CK"  Micro Results Recent Results (from the past 240 hour(s))  Culture, body fluid w Gram Stain-bottle     Status: None (Preliminary result)   Collection Time: 07/26/23 12:33 PM   Specimen: Pleura  Result Value Ref Range Status   Specimen Description PLEURAL  Final   Special Requests PLEURAL  Final   Culture   Final    NO GROWTH < 24 HOURS Performed at [facility], [address], [place], [place] [address]    Report Status PENDING  Incomplete  Gram stain     Status: None   Collection Time: 07/26/23 12:33 PM   Specimen: Pleura  Result Value Ref Range Status   Specimen Description PLEURAL  Final   Special Requests NONE  Final   Gram Stain   Final    RARE WBC PRESENT,BOTH PMN AND MONONUCLEAR NO ORGANISMS SEEN Performed at [facility], [address], [place], [place] [address]    Report Status 07/26/2023 FINAL  Final  Respiratory (~20 pathogens) panel by PCR     Status: None   Collection Time: 07/27/23 12:28 PM   Specimen: Nasopharyngeal Swab; Respiratory  Result Value Ref Range Status   Adenovirus NOT DETECTED NOT DETECTED Final   Coronavirus 229E NOT DETECTED NOT DETECTED Final    Comment: (NOTE) The Coronavirus on the Respiratory Panel, DOES NOT test for the novel  Coronavirus (2019 nCoV)    Coronavirus HKU1 NOT DETECTED NOT DETECTED Final   Coronavirus NL63 NOT DETECTED NOT DETECTED Final   Coronavirus OC43 NOT DETECTED NOT DETECTED Final   Metapneumovirus NOT DETECTED NOT DETECTED Final   Rhinovirus / Enterovirus NOT DETECTED NOT DETECTED Final   Influenza A NOT DETECTED NOT DETECTED Final   Influenza B NOT DETECTED NOT DETECTED Final   Parainfluenza Virus 1 NOT DETECTED NOT DETECTED Final   Parainfluenza Virus 2 NOT  DETECTED NOT DETECTED Final   Parainfluenza Virus 3 NOT DETECTED NOT DETECTED Final   Parainfluenza Virus 4 NOT DETECTED NOT DETECTED Final   Respiratory Syncytial Virus NOT DETECTED NOT DETECTED Final   Bordetella pertussis NOT DETECTED NOT DETECTED Final   Bordetella Parapertussis NOT DETECTED NOT DETECTED Final   Chlamydophila pneumoniae NOT DETECTED NOT DETECTED Final   Mycoplasma pneumoniae NOT DETECTED NOT DETECTED Final    Comment: Performed at [facility], [address] [place], [place] [address]    Radiology Reports US THYROID  Result Date: 07/27/2023 CLINICAL DATA:  Hyperthyroidism. EXAM: THYROID ULTRASOUND TECHNIQUE: Ultrasound  examination of the thyroid gland and adjacent soft tissues was performed. COMPARISON:  None Available. FINDINGS: Parenchymal Echotexture: Mildly heterogenous Isthmus: 0.2 cm Right lobe: 5.1 x 2.0 x 1.7 cm Left lobe: 4.4 x 1.6 x 1.6 cm _________________________________________________________ Estimated total number of nodules >/= 1 cm: 2 Number of spongiform nodules >/=  2 cm not described below (TR1): 0 Number of mixed cystic and solid nodules >/= 1.5 cm not described below (TR2): 0 _________________________________________________________ Nodule #1 is a hyperechoic nodule in the right inferior thyroid lobe. This nodule measures up to 0.6 cm and does not meet criteria for biopsy or dedicated follow-up. Nodule # 2: Location: Left; Superior Maximum size: 1.0 cm; Other 2 dimensions: 0.4 x 0.7 cm Composition: solid/almost completely solid (2) Echogenicity: hyperechoic (1) Shape: not taller-than-wide (0) Margins: ill-defined (0) Echogenic foci: none (0) ACR TI-RADS total points: 3. ACR TI-RADS risk category: TR3 (3 points). ACR TI-RADS recommendations: Given size (<1.4 cm) and appearance, this nodule does NOT meet TI-RADS criteria for biopsy or dedicated follow-up. _________________________________________________________ Nodule # 3: Location: Left; Inferior  Maximum size: 1.0 cm; Other 2 dimensions: 0.6 x 0.6 cm Composition: solid/almost completely solid (2) Echogenicity: hyperechoic (1) Shape: not taller-than-wide (0) Margins: ill-defined (0) Echogenic foci: none (0) ACR TI-RADS total points: 3. ACR TI-RADS risk category: TR3 (3 points). ACR TI-RADS recommendations: Given size (<1.4 cm) and appearance, this nodule does NOT meet TI-RADS criteria for biopsy or dedicated follow-up. _________________________________________________________ Thyroid tissue is hypervascular. IMPRESSION: 1. Thyroid tissue is hypervascular and mildly heterogeneous. 2. Bilateral thyroid nodules that do not meet criteria for biopsy or dedicated follow-up. The above is in keeping with the ACR TI-RADS recommendations - J Am Coll Radiol 2017;14:587-595. Electronically Signed   By: Richarda Overlie M.D.   On: 07/27/2023 07:38   DG Chest Port 1 View  Result Date: 07/27/2023 CLINICAL DATA:  Shortness of breath.  Recent pleural effusion. EXAM: PORTABLE CHEST 1 VIEW COMPARISON:  07/26/23 FINDINGS: Normal heart size. Right pleural effusion is again noted. There is been interval worsening aeration to the right mid and right lower lung which is favored to represent interval reaccumulation of right pleural effusion with progressive veil like opacification of the right lower lung. Cannot exclude superimposed airspace disease or atelectasis. Scar like opacities noted in the right upper lobe. IMPRESSION: Interval worsening aeration to the right mid and right lower lung which is favored to represent interval reaccumulation of right pleural effusion with progressive veil like opacification of the right lower lung. Cannot exclude superimposed airspace disease or atelectasis. Electronically Signed   By: Signa Kell M.D.   On: 07/27/2023 07:03   VAS [place] LOWER EXTREMITY VENOUS (DVT)  Result Date: 07/26/2023  Lower Venous DVT Study Patient Name:  GIANNA CALEF Mickle  Date of Exam:   07/26/2023 Medical Rec #: [id number]       Accession #:    [id number] Date of Birth: Jan 14, 1946      Patient Gender: F Patient Age:   56 years Exam Location:  [facility] Procedure:      VAS [place] LOWER EXTREMITY VENOUS (DVT) Referring Phys: Bess Harvest [facility] --------------------------------------------------------------------------------  Indications: Swelling, and "rapidly rising D-Dimer".  Limitations: Body habitus and poor ultrasound/tissue interface. Comparison Study: No previous study. Performing Technologist: McKayla Maag RVT, [place]  Examination Guidelines: A complete evaluation includes B-mode imaging, spectral Doppler, color Doppler, and power Doppler as needed of all accessible portions of each vessel. Bilateral testing is considered an integral part of a complete examination. Limited examinations  for reoccurring indications may be performed as noted. The reflux portion of the exam is performed with the patient in reverse Trendelenburg.  +---------+---------------+---------+-----------+----------+--------------+ RIGHT    CompressibilityPhasicitySpontaneityPropertiesThrombus Aging +---------+---------------+---------+-----------+----------+--------------+ CFV      Full           Yes      Yes                                 +---------+---------------+---------+-----------+----------+--------------+ SFJ      Full                                                        +---------+---------------+---------+-----------+----------+--------------+ FV Prox  Full                                                        +---------+---------------+---------+-----------+----------+--------------+ FV Mid   Full                                                        +---------+---------------+---------+-----------+----------+--------------+ FV DistalFull                                                        +---------+---------------+---------+-----------+----------+--------------+ PFV      Full                                                         +---------+---------------+---------+-----------+----------+--------------+ POP      Full           Yes      Yes                                 +---------+---------------+---------+-----------+----------+--------------+ PTV      Full                                                        +---------+---------------+---------+-----------+----------+--------------+ PERO     Full                                                        +---------+---------------+---------+-----------+----------+--------------+   +---------+---------------+---------+-----------+----------+--------------+ LEFT     CompressibilityPhasicitySpontaneityPropertiesThrombus Aging +---------+---------------+---------+-----------+----------+--------------+ CFV      Full  Yes      Yes                                 +---------+---------------+---------+-----------+----------+--------------+ SFJ      Full                                                        +---------+---------------+---------+-----------+----------+--------------+ FV Prox  Full                                                        +---------+---------------+---------+-----------+----------+--------------+ FV Mid   Full                                                        +---------+---------------+---------+-----------+----------+--------------+ FV DistalFull                                                        +---------+---------------+---------+-----------+----------+--------------+ PFV      Full                                                        +---------+---------------+---------+-----------+----------+--------------+ POP                                                   Not visualized +---------+---------------+---------+-----------+----------+--------------+ PTV      Full                                                         +---------+---------------+---------+-----------+----------+--------------+ PERO     Full                                         limited        +---------+---------------+---------+-----------+----------+--------------+     Summary: RIGHT: - There is no evidence of deep vein thrombosis in the lower extremity.  - No cystic structure found in the popliteal fossa.  LEFT: - There is no evidence of deep vein thrombosis in the lower extremity. However, portions of this examination were limited- see technologist comments above.  - No cystic structure found in the popliteal fossa.  *See table(s) above for measurements and observations. Electronically signed by Sherald Hess MD on 07/26/2023 at  5:37:26 PM.    Final    ECHOCARDIOGRAM COMPLETE  Result Date: 07/26/2023    ECHOCARDIOGRAM REPORT   Patient Name:   DEANDA RUDDELL Hailes Date of Exam: 07/26/2023 Medical Rec #:  [id number]     Height:       70.0 in Accession #:    [id number]    Weight:       124.0 lb Date of Birth:  10/10/1946     BSA:          1.704 m Patient Age:    77 years      BP:           132/80 mmHg Patient Gender: F             HR:           73 bpm. Exam Location:  Inpatient Procedure: 2D Echo, Cardiac Doppler and Color Doppler Indications:    CHF-Acute Diastolic I50.31  History:        Patient has no prior history of Echocardiogram examinations.                 CHF, Signs/Symptoms:Hypotension and Chest Pain; Risk                 Factors:Hypertension and Dyslipidemia. CKD, stage 3.  Sonographer:    Lucendia Herrlich Referring Phys: Effie Shy  [facility] IMPRESSIONS  1. Distal septal apical hypokinesis . Left ventricular ejection fraction, by estimation, is 50 to 55%. The left ventricle has low normal function. The left ventricle has no regional wall motion abnormalities. There is mild left ventricular hypertrophy of the basal and septal segments. Left ventricular diastolic parameters are indeterminate.  2. Right ventricular systolic function is normal. The  right ventricular size is normal.  3. A small pericardial effusion is present. The pericardial effusion is anterior to the right ventricle.  4. The mitral valve is degenerative. Mild mitral valve regurgitation. No evidence of mitral stenosis. Severe mitral annular calcification.  5. Tricuspid valve regurgitation is mild to moderate.  6. The aortic valve is tricuspid. There is moderate calcification of the aortic valve. There is moderate thickening of the aortic valve. Aortic valve regurgitation is trivial. Aortic valve sclerosis is present, with no evidence of aortic valve stenosis.  7. The inferior vena cava is normal in size with greater than 50% respiratory variability, suggesting right atrial pressure of 3 mmHg. FINDINGS  Left Ventricle: Distal septal apical hypokinesis. Left ventricular ejection fraction, by estimation, is 50 to 55%. The left ventricle has low normal function. The left ventricle has no regional wall motion abnormalities. The left ventricular internal cavity size was normal in size. There is mild left ventricular hypertrophy of the basal and septal segments. Left ventricular diastolic parameters are indeterminate. Right Ventricle: The right ventricular size is normal. No increase in right ventricular wall thickness. Right ventricular systolic function is normal. Left Atrium: Left atrial size was normal in size. Right Atrium: Right atrial size was normal in size. Pericardium: A small pericardial effusion is present. The pericardial effusion is anterior to the right ventricle. Mitral Valve: The mitral valve is degenerative in appearance. There is moderate thickening of the mitral valve leaflet(s). There is moderate calcification of the mitral valve leaflet(s). Severe mitral annular calcification. Mild mitral valve regurgitation. No evidence of mitral valve stenosis. MV peak gradient, 10.4 mmHg. The mean mitral valve gradient is 3.0 mmHg. Tricuspid Valve: The tricuspid valve is normal in structure.  Tricuspid valve regurgitation is mild to moderate.  No evidence of tricuspid stenosis. Aortic Valve: The aortic valve is tricuspid. There is moderate calcification of the aortic valve. There is moderate thickening of the aortic valve. Aortic valve regurgitation is trivial. Aortic valve sclerosis is present, with no evidence of aortic valve  stenosis. Aortic valve peak gradient measures 4.8 mmHg. Pulmonic Valve: The pulmonic valve was normal in structure. Pulmonic valve regurgitation is mild. No evidence of pulmonic stenosis. Aorta: The aortic root is normal in size and structure. Venous: The inferior vena cava is normal in size with greater than 50% respiratory variability, suggesting right atrial pressure of 3 mmHg. IAS/Shunts: No atrial level shunt detected by color flow Doppler.  LEFT VENTRICLE PLAX 2D LVIDd:         3.10 cm   Diastology LVIDs:         1.90 cm   LV e' medial:    4.51 cm/s LV PW:         1.30 cm   LV E/e' medial:  26.2 LV IVS:        1.00 cm   LV e' lateral:   4.32 cm/s LVOT diam:     1.90 cm   LV E/e' lateral: 27.3 LV SV:         48 LV SV Index:   28 LVOT Area:     2.84 cm  RIGHT VENTRICLE             IVC RV S prime:     18.40 cm/s  IVC diam: 1.60 cm TAPSE (M-mode): 2.0 cm LEFT ATRIUM             Index        RIGHT ATRIUM           Index LA diam:        3.60 cm 2.11 cm/m   RA Area:     12.90 cm LA Vol (A2C):   64.7 ml 37.98 ml/m  RA Volume:   29.60 ml  17.38 ml/m LA Vol (A4C):   67.5 ml 39.62 ml/m LA Biplane Vol: 73.2 ml 42.97 ml/m  AORTIC VALVE AV Area (Vmax): 2.36 cm AV Vmax:        109.00 cm/s AV Peak Grad:   4.8 mmHg LVOT Vmax:      90.80 cm/s LVOT Vmean:     58.367 cm/s LVOT VTI:       0.171 m  AORTA Ao Root diam: 3.30 cm Ao Asc diam:  2.80 cm MITRAL VALVE                TRICUSPID VALVE MV Area (PHT): 3.85 cm     TR Peak grad:   43.6 mmHg MV Area VTI:   1.12 cm     TR Vmax:        330.00 cm/s MV Peak grad:  10.4 mmHg MV Mean grad:  3.0 mmHg     SHUNTS MV Vmax:       1.61 m/s      Systemic VTI:  0.17 m MV Vmean:      77.2 cm/s    Systemic Diam: 1.90 cm MV Decel Time: 197 msec MV E velocity: 118.00 cm/s MV A velocity: 107.00 cm/s MV E/A ratio:  1.10 Charlton Haws MD Electronically signed by Charlton Haws MD Signature Date/Time: 07/26/2023/2:19:09 PM    Final    MR SHOULDER LEFT WO CONTRAST  Result Date: 07/26/2023 CLINICAL DATA:  Chronic left shoulder pain. Rotator cuff disorder suspected. EXAM: MRI OF THE LEFT  SHOULDER WITHOUT CONTRAST TECHNIQUE: Multiplanar, multisequence MR imaging of the shoulder was performed. No intravenous contrast was administered. COMPARISON:  None Available. FINDINGS: Despite efforts by the technologist and patient, motion artifact is present on today's exam and could not be eliminated. This reduces exam sensitivity and specificity. Rotator cuff: Within the limitations of patient motion artifact, there appears to be mild supraspinatus and infraspinatus tendinosis. No definite tendon tear. There is a focus of decreased T1 increased T2 signal cystic change versus focal marrow edema within the humeral head just deep to the slightly anterior aspect of the supraspinatus tendon footprint measuring up to 12 x 9 mm (AP by craniocaudal). Mild superior subscapularis intermediate T2 signal tendinosis. The teres minor is intact. Muscles:  Moderate supraspinatus muscle atrophy. Biceps long head: The intra-articular long head of the biceps tendon is intact. Acromioclavicular Joint: There are mild degenerative changes of the acromioclavicular joint including joint space narrowing and peripheral osteophytosis. Type III acromion with mild downsloping of the anterolateral acromion. Mild fluid within the subacromial/subdeltoid bursa. Glenohumeral Joint: Moderate to severe glenohumeral cartilage thinning. Moderate inferior humeral head-neck junction degenerative osteophytosis. Mild glenoid fossa and medial humeral head cortical flattening/remodeling. Labrum: Mild-to-moderate attenuation  and degenerative irregularity of the posterior glenoid labrum. Bones:  No acute fracture. Other: Mild joint fluid extends into the subcoracoid recess in this region there is a 15 x 12 x 10 mm low signal loose body (sagittal series 6, image 18, coronal series 4, image 14, and axial series 2, image 11). Within the inferior axillary pouch there is marrow fat intensity likely loose body measuring up to 2.2 cm (sagittal series 7, image 12). IMPRESSION: 1. Motion artifact technically limits this examination. 2. 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 3. Mild supraspinatus, infraspinatus, and superior subscapularis tendinosis. No definite rotator cuff tear. 4. Mild degenerative changes of the acromioclavicular joint. Type III acromion with mild downsloping of the anterolateral acromion. 5. Mild subacromial/subdeltoid bursitis. 6. Multiple loose bodies as above. Electronically Signed   By: Neita Garnet M.D.   On: 07/26/2023 13:58   IR THORACENTESIS ASP PLEURAL SPACE W/IMG GUIDE  Result Date: 07/26/2023 INDICATION: Patient with a history of heart failure presents today with new onset right pleural effusion. Interventional Radiology asked to perform a diagnostic and therapeutic thoracentesis. EXAM: ULTRASOUND GUIDED THORACENTESIS MEDICATIONS: 1% lidocaine 10 ml COMPLICATIONS: None immediate. PROCEDURE: An ultrasound guided thoracentesis was thoroughly discussed with the patient and questions answered. The benefits, risks, alternatives and complications were also discussed. The patient understands and wishes to proceed with the procedure. Written consent was obtained. Ultrasound was performed to localize and mark an adequate pocket of fluid in the right chest. The area was then prepped and draped in the normal sterile fashion. 1% Lidocaine was used for local anesthesia. Under ultrasound guidance a 6 Fr Safe-T-Centesis catheter was introduced. Thoracentesis was performed. The catheter was removed and a dressing  applied. FINDINGS: A total of approximately 1.3 L of clear yellow fluid was removed. Samples were sent to the laboratory as requested by the clinical team. IMPRESSION: Successful ultrasound guided right thoracentesis yielding 1.3 L of pleural fluid. Procedure performed by: Alwyn Ren, NP and supervised by Dr. Fredia Sorrow Electronically Signed   By: Irish Lack M.D.   On: 07/26/2023 13:48   DG Chest 1 View  Result Date: 07/26/2023 CLINICAL DATA:  Status post thoracentesis EXAM: CHEST  1 VIEW COMPARISON:  07/25/2023 FINDINGS: Diminished fluid volume of a loculated lateral and basal right hydropneumothorax, with moderate, layering persistent fluid volume  and associated pleural thickening. Left lung is normally aerated. Heart and mediastinum are normal. IMPRESSION: Diminished fluid volume of a loculated lateral and basal right hydropneumothorax, with moderate, layering persistent fluid volume and associated pleural thickening. Electronically Signed   By: Jearld Lesch M.D.   On: 07/26/2023 13:10   CT Angio Chest PE W and/or Wo Contrast  Result Date: 07/25/2023 CLINICAL DATA:  Pulmonary embolism suspected, low to intermediate probability, positive D-dimer. Chest pain radiating across shoulder and down left arm. Wheezing and shortness of breath. EXAM: CT ANGIOGRAPHY CHEST WITH CONTRAST TECHNIQUE: Multidetector CT imaging of the chest was performed using the standard protocol during bolus administration of intravenous contrast. Multiplanar CT image reconstructions and MIPs were obtained to evaluate the vascular anatomy. RADIATION DOSE REDUCTION: This exam was performed according to the departmental dose-optimization program which includes automated exposure control, adjustment of the mA and/or kV according to patient size and/or use of iterative reconstruction technique. CONTRAST:  75mL OMNIPAQUE IOHEXOL 350 MG/ML SOLN COMPARISON:  12/13/2017. FINDINGS: Cardiovascular: The heart is normal in size and there is  a trace pericardial effusion. Few scattered coronary artery calcifications are noted. There is atherosclerotic calcification of the aorta without evidence of aneurysm. The pulmonary trunk is normal in caliber. No evidence of pulmonary embolism. Mediastinum/Nodes: No enlarged mediastinal, hilar, or axillary lymph nodes. Thyroid gland, trachea, and esophagus demonstrate no significant findings. Lungs/Pleura: There is a large right pleural effusion with compressive atelectasis. No pneumothorax. Upper Abdomen: No acute abnormality. Musculoskeletal: Bilateral breast implants are noted. Cervical spinal fusion hardware is present. There are degenerative changes in the thoracic spine. Review of the MIP images confirms the above findings. IMPRESSION: 1. No evidence of pulmonary embolism. 2. Large right pleural effusion with compressive atelectasis. 3. Coronary artery calcifications. 4. Aortic atherosclerosis Electronically Signed   By: Thornell Sartorius M.D.   On: 07/25/2023 04:28   DG Chest Portable 1 View  Result Date: 07/25/2023 CLINICAL DATA:  Chest pain. EXAM: PORTABLE CHEST 1 VIEW COMPARISON:  Chest radiograph dated 06/11/2023. FINDINGS: Moderate right pleural effusion, significantly increased since the prior radiograph. There is associated compressive atelectasis of the right lung base versus pneumonia. Interval resolution of the previously seen left pleural effusion. The left lung is clear. No pneumothorax. The cardiac silhouette is within normal limits. No acute osseous pathology. IMPRESSION: Moderate right pleural effusion, increased since the prior radiograph. Electronically Signed   By: Elgie Collard M.D.   On: 07/25/2023 03:57      Signature  -   Gwenevere Abbot M.D on 07/28/2023 at 5:19 AM   -  To [place] go to [email or web address]

## 2023-07-28 NOTE — Progress Notes (Signed)
ANTICOAGULATION CONSULT NOTE - Follow Up Consult  Pharmacy Consult for Heparin Indication: atrial fibrillation  Allergies  Allergen Reactions   Morphine And Codeine Hives   Prevacid [Lansoprazole] Other (See Comments)    unknown   Provigil [Modafinil] Other (See Comments)    dizziness   Adhesive [Tape] Rash    bandaids     Patient Measurements: Height: 5\' 10"  (177.8 cm) Weight: 55.8 kg (123 lb 0.3 oz) IBW/kg (Calculated) : 68.5 Heparin Dosing Weight: 56 kg  Vital Signs: Temp: 97.6 F (36.4 C) (08/03 0431) Temp Source: Oral (08/03 0431) BP: 131/63 (08/03 0431) Pulse Rate: 72 (08/03 0431)  Labs: Recent Labs    07/25/23 0636 07/25/23 4098 07/25/23 1533 07/25/23 1745 07/26/23 0559 07/26/23 0559 07/27/23 0146 07/27/23 0852 07/27/23 1758 07/28/23 0403  HGB  --   --   --   --  10.5*   < > 10.0*  --   --  9.3*  HCT  --   --   --   --  32.8*  --  30.4*  --   --  28.7*  PLT  --   --   --   --  283  --  287  --   --  282  LABPROT 15.4*  --   --   --   --   --   --   --   --   --   INR 1.2  --   --   --   --   --   --   --   --   --   HEPARINUNFRC  --   --   --   --   --   --   --   --  <0.10* 0.16*  CREATININE  --   --   --   --  0.84  --  0.84  --   --  0.95  TROPONINIHS  --    < > 42* 41*  --   --   --  26*  --   --    < > = values in this interval not displayed.    Estimated Creatinine Clearance: 44.4 mL/min (by C-G formula based on SCr of 0.95 mg/dL).  Assessment: Patient is a 36 yof admitted for chest pain and new onset afib. Patient found to have pericardial effusion/pericarditis. She had thoracentesis 8/1 with 1.3L removed. Pharmacy consulted for heparin infusion for afib.  Heparin level 0.16 is subtherapeutic on 1000 units/hr. Hgb slight down trend.  No issues with infusion or bleeding per RN.   Goal of Therapy:  Heparin level 0.3-0.7 units/ml Monitor platelets by anticoagulation protocol: Yes   Plan:   Increase heparin drip to 1150 units/hr Heparin  level 8hr after increase. Daily heparin level and CBC while on heparin. Monitor effusion status and any need for repeat thoracentesis.  Alphia Moh, PharmD, BCPS, BCCP Clinical Pharmacist  Please check AMION for all [facility] Pharmacy phone numbers After 10:00 PM, call Main Pharmacy [phone]

## 2023-07-28 NOTE — Plan of Care (Signed)

## 2023-07-29 ENCOUNTER — Inpatient Hospital Stay (HOSPITAL_COMMUNITY): Payer: Medicare PPO

## 2023-07-29 DIAGNOSIS — J90 Pleural effusion, not elsewhere classified: Secondary | ICD-10-CM | POA: Diagnosis not present

## 2023-07-29 LAB — COMPREHENSIVE METABOLIC PANEL WITH GFR
ALT: 14 U/L (ref 0–44)
AST: 18 U/L (ref 15–41)
Albumin: 2.2 g/dL — ABNORMAL LOW (ref 3.5–5.0)
Alkaline Phosphatase: 121 U/L (ref 38–126)
Anion gap: 8 (ref 5–15)
BUN: 16 mg/dL (ref 8–23)
CO2: 21 mmol/L — ABNORMAL LOW (ref 22–32)
Calcium: 8.5 mg/dL — ABNORMAL LOW (ref 8.9–10.3)
Chloride: 102 mmol/L (ref 98–111)
Creatinine, Ser: 1 mg/dL (ref 0.44–1.00)
GFR, Estimated: 58 mL/min — ABNORMAL LOW (ref >60)
Glucose, Bld: 95 mg/dL (ref 70–99)
Potassium: 4.3 mmol/L (ref 3.5–5.1)
Sodium: 131 mmol/L — ABNORMAL LOW (ref 135–145)
Total Bilirubin: 0.6 mg/dL (ref 0.3–1.2)
Total Protein: 5.6 g/dL — ABNORMAL LOW (ref 6.5–8.1)

## 2023-07-29 LAB — PROCALCITONIN: Procalcitonin: <0.1 ng/mL

## 2023-07-29 LAB — CBC WITH DIFFERENTIAL/PLATELET
Abs Immature Granulocytes: 0.04 10*3/uL (ref 0.00–0.07)
Basophils Absolute: 0.1 10*3/uL (ref 0.0–0.1)
Basophils Relative: 1 %
Eosinophils Absolute: 0.7 10*3/uL — ABNORMAL HIGH (ref 0.0–0.5)
Eosinophils Relative: 8 %
HCT: 31.3 % — ABNORMAL LOW (ref 36.0–46.0)
Hemoglobin: 10 g/dL — ABNORMAL LOW (ref 12.0–15.0)
Immature Granulocytes: 1 %
Lymphocytes Relative: 21 %
Lymphs Abs: 1.7 10*3/uL (ref 0.7–4.0)
MCH: 26.6 pg (ref 26.0–34.0)
MCHC: 31.9 g/dL (ref 30.0–36.0)
MCV: 83.2 fL (ref 80.0–100.0)
Monocytes Absolute: 0.9 10*3/uL (ref 0.1–1.0)
Monocytes Relative: 11 %
Neutro Abs: 4.8 10*3/uL (ref 1.7–7.7)
Neutrophils Relative %: 58 %
Platelets: 306 10*3/uL (ref 150–400)
RBC: 3.76 MIL/uL — ABNORMAL LOW (ref 3.87–5.11)
RDW: 14.3 % (ref 11.5–15.5)
WBC: 8.1 10*3/uL (ref 4.0–10.5)
nRBC: 0 % (ref 0.0–0.2)

## 2023-07-29 LAB — MAGNESIUM: Magnesium: 2 mg/dL (ref 1.7–2.4)

## 2023-07-29 LAB — OSMOLALITY: Osmolality: 281 mOsm/kg (ref 275–295)

## 2023-07-29 LAB — HEPARIN LEVEL (UNFRACTIONATED): Heparin Unfractionated: 0.51 [IU]/mL (ref 0.30–0.70)

## 2023-07-29 LAB — URIC ACID: Uric Acid, Serum: 4.9 mg/dL (ref 2.5–7.1)

## 2023-07-29 LAB — BRAIN NATRIURETIC PEPTIDE: B Natriuretic Peptide: 574.4 pg/mL — ABNORMAL HIGH (ref 0.0–100.0)

## 2023-07-29 LAB — C-REACTIVE PROTEIN: CRP: 7.7 mg/dL — ABNORMAL HIGH (ref <1.0)

## 2023-07-29 MED ORDER — HYDROMORPHONE HCL 1 MG/ML IJ SOLN
0.5000 mg | Freq: Once | INTRAMUSCULAR | Status: AC
  Administered 2023-07-29: 0.5 mg via INTRAVENOUS
  Filled 2023-07-29: qty 0.5

## 2023-07-29 MED ORDER — FUROSEMIDE 10 MG/ML IJ SOLN
40.0000 mg | Freq: Once | INTRAMUSCULAR | Status: AC
  Administered 2023-07-29: 40 mg via INTRAVENOUS
  Filled 2023-07-29: qty 4

## 2023-07-29 NOTE — Progress Notes (Signed)
Speech Language Pathology Treatment: Dysphagia  Patient Details Name: Kristen Hansen MRN: [id number] DOB: 1946/02/01 Today's Date: 07/29/2023 Time: 9562-1308 SLP Time Calculation (min) (ACUTE ONLY): 19 min  Assessment / Plan / Recommendation Clinical Impression  Reviewed imaging from MBSS in room with pt and husband.  Today's MBSS is consistent with study completed at [facility] 05/15/23, or perhaps represents a slight worsening of swallow function.  Provided education regarding risks and benefits of thickened liquids.  Husband reports that during trial of NTL/Mildly Thick liquid following May MBSS he believed the modified liquid consistency created as many or more problems than it addressed and returned to thin liquids. He also reports that he has observed more coughing with cola this admission as compared to sweet tea she typically drinks at home.  As both are thin liquids, it may be that the effects of chemesthesis with carbonated drinks from are provoking an increased sensory response to pen/asp that she does not have with flat beverages. It would be reasonable to continue thin liquids with aspiration precautions and regular thorough oral care for comfort and quality of life. Pt voiced that she does not like thickened liquids.  She had decreased fluid intake with NTL earlir this year, and with HTL that would likely be exacerbated.  It would also be reasonable to proceed with modified liquids given current lung effects which may well be related to aspiration.  Pt is currently undergoing lung ultrasound at bedside.  Pt may benefit from a palliative care consult.  Her swallowing impairments likely  represent progression of her chronic illness and may be minimally responsive to intervention.  I would recommend against a feeding tube for this pt should the need arise later on in favor of careful hand feeding as AMN is unlikely to improve outcomes for pt.  Pt and husband were very receptive to SLP education and  voiced that this additional information will be helpful in decision making.    SLP will defer determination of liquid consistency to pt/family and providers, but pt may continue a mechanical soft/D3 texture diet with liquids of choice.    HPI HPI: Patient is a 77 y.o. female with PMH: Lewy body dementia, HTN, HLD, Sjogren's syndrome, RA, chronic pain, DVT, spinal stenosis, diastolic CHF, depression, REM behavior disorder, orthostatic hypotension, CKD 3, GERD, hypothyroidism. She presented to the hospital on 06/27/23 with chest pain.  CT PE study showed no PE but did show a large right pleural effusion with compressive atelectasis.  ardiology was consulted in ED and had low suspicion for MI based on EKG review. CXR showed Moderate right pleural effusion, increased since the prior  radiograph. She had MBS completed at [facility] on [facility]/15/2023 which reported decline of swallow function since 2022 MBS consisting of "mistiming of the pharyngeal swallow, decreased bolus propulsion (base of tongue retraction), incomplete laryngeal vestibule closure, and diminished laryngeal pharyngeal sensation. Deficits result in mild post-swallow residual in the valleculae; penetration of mildly-thick liquid, and puree; and penetration AND aspiration of thin liquid."      SLP Plan  Continue with current plan of care      Recommendations for follow up therapy are one component of a multi-disciplinary discharge planning process, led by the attending physician.  Recommendations may be updated based on patient status, additional functional criteria and insurance authorization.    Recommendations  Diet recommendations: Dysphagia 3 (mechanical soft) (Defer liquid consistency to family, providers) Medication Administration: Whole meds with puree                 (  Palliative consult) Oral care BID     Dysphagia, oropharyngeal phase (R13.12)     Continue with current plan of care     [name], MA,  CCC-SLP Acute Rehabilitation Services Office: [phone] 07/29/2023, 1:54 PM

## 2023-07-29 NOTE — Progress Notes (Addendum)
Assisted with CT placement to R side. Consent signed and in chart. 14Fr catheter placed. Pt tolerated procedure well with yellow drainage noted. VSS, CXR ordered.

## 2023-07-29 NOTE — Procedures (Addendum)
Modified Barium Swallow Study  Patient Details  Name: Kristen Hansen MRN: [id number] Date of Birth: [birth year]  Today's Date: 07/29/2023  Modified Barium Swallow completed.  Full report located under Chart Review in the Imaging Section.  History of Present Illness     Clinical Impression Pt presents with a moderate oropharyngeal dysphagia c/b delayed swallow initiation, decreased base of tongue retraction, incomplete laryngeal closure, reduced laryngeal elevation, decreased UES opening, and diminished sensation. These deficits resulted in mild penetration and eventual trace aspiration of thin and penetration to the vocal folds with supsected eventual aspiration of nectar thick liquids.  Penetration occured before swallow and was not cleared during swallow or after with cued cough.  Reflexive cough was inconsistent and partially effective at clearing penetration/aspiration.   There was transient penetration of puree x1, but no further penetration with puree or solid textures.  With pill simulation there was penetration and suspected aspiration of liquid wash.  There was vallecular stasis of tablet which was difficulty clear with liquid wash and puree boluses.  Tablet transited to esophagus following third bolus of puree.  On esophageal sweep there was some retention of contrast and patulous esophagus noted.  There was decreased UES opening which resulted in pyriform residue which was decreased with subsequent swallows.  Pt with presence of cervical hardware from ACDF which does narrow the hypopharnx.  Pt was able to acheive adequate epiglottic inversion despite this, but it is unclear if it is affectinge UES dilation.  Pt was unable to reliably excute compensatory strategies.  This study appears fairly consistent with MBSS completed at [facility] in May of this year per evaluating therapist's report.  Honey thick/moderately liquids would most reliablly prevent aspiration; however, trial of nectar/mildly thick  liquid resulted in decreased fluid intake, which would likely be execarbated with an even thicker consistency and family has expressed desired to continue thin liquid with known risk of aspiration. If desired, we could consider trial swallowing therapy to determine if pt can complete swallow exercise program, but change to swallow function may represent, at least in part, the natural progression of her chronic illness/dementia.   Will defer diet decision to patient/family and provider.  Factors that may increase risk of adverse event in presence of aspiration Kristen Hansen & Kristen Hansen 2021): Frail or deconditioned;Weak cough;Reduced cognitive function  Swallow Evaluation Recommendations Recommendations: PO diet PO Diet Recommendation: Dysphagia 3 (Mechanical soft) (defer liquid consistency to MD/family) Medication Administration: Whole meds with puree Supervision: Full supervision/cueing for swallowing strategies Swallowing strategies  : Slow rate;Small bites/sips Postural changes: Position pt fully upright for meals;Stay upright 30-60 min after meals Oral care recommendations: Oral care BID (2x/day)      Kristen Pleasure, MA, CCC-SLP Acute Rehabilitation Services Office: [phone] 07/29/2023,1:51 PM

## 2023-07-29 NOTE — Progress Notes (Signed)
Cardiology :  followed at [facility]  Subjective:  Not doing well -- had onset of productive cough, shortness of breath about 30 minutes prior to my arrival. IM resident present, ordering CXR, speech eval for possible aspiration.  Remains in sinus rhythm.  Objective:  Vitals:   07/28/23 2127 07/29/23 0000 07/29/23 0400 07/29/23 0740  BP: (!) 144/72 (!) 151/72 134/74   Pulse: 70 63 63   Resp:  14 15   Temp:  98 F (36.7 C) 97.8 F (36.6 C)   TempSrc:  Oral Oral Oral  SpO2:  94% 96%   Weight:      Height:        Intake/Output from previous day: No intake or output data in the 24 hours ending 07/29/23 0910   Physical Exam:  Frail elderly female Decreased BS mid/base right ? 2 component friction rub with SEM Abdomen benign Trace edema  Lab Results: Basic Metabolic Panel: Recent Labs    07/28/23 0403 07/29/23 0327  NA 131* 131*  K 3.7 4.3  CL 101 102  CO2 21* 21*  GLUCOSE 104* 95  BUN 20 16  CREATININE 0.95 1.00  CALCIUM 8.1* 8.5*  MG 2.2 2.0   Liver Function Tests: Recent Labs    07/28/23 0403 07/29/23 0327  AST 8* 18  ALT 12 14  ALKPHOS 108 121  BILITOT <0.1* 0.6  PROT 5.0* 5.6*  ALBUMIN 1.9* 2.2*   No results for input(s): "LIPASE", "AMYLASE" in the last 72 hours. CBC: Recent Labs    07/28/23 0403 07/29/23 0327  WBC 8.9 8.1  NEUTROABS 6.1 4.8  HGB 9.3* 10.0*  HCT 28.7* 31.3*  MCV 82.5 83.2  PLT 282 306   Cardiac Enzymes: No results for input(s): "CKTOTAL", "CKMB", "CKMBINDEX", "TROPONINI" in the last 72 hours. BNP: Invalid input(s): "POCBNP" D-Dimer: No results for input(s): "DDIMER" in the last 72 hours.  Hemoglobin A1C: No results for input(s): "HGBA1C" in the last 72 hours.  Fasting Lipid Panel: No results for input(s): "CHOL", "HDL", "LDLCALC", "TRIG", "CHOLHDL", "LDLDIRECT" in the last 72 hours.  Thyroid Function Tests: Recent Labs    07/26/23 1406  T3FREE 1.7*   Anemia Panel: No results for input(s): "VITAMINB12",  "FOLATE", "FERRITIN", "TIBC", "IRON", "RETICCTPCT" in the last 72 hours.  Imaging: DG CHEST PORT 1 VIEW  Result Date: 07/28/2023 CLINICAL DATA:  Follow-up pleural effusion EXAM: PORTABLE CHEST 1 VIEW COMPARISON:  07/27/2023 FINDINGS: Artifact overlies the chest. Left chest remains clear. Similar mount of pleural density on the right with volume loss in the right lower lung. No worsening or new finding. IMPRESSION: No worsening or new finding. Persistent pleural density on the right with volume loss. Electronically Signed   By: Paulina Fusi M.D.   On: 07/28/2023 09:54    Cardiac Studies:  ECG: afib LAD poor R wave progression ST elevation resolved   Telemetry:afib rate 80-90   Echo: EF 50-55% distal septal apical hypokinesis   Medications:    amiodarone  200 mg Oral BID   aspirin  81 mg Oral Daily   clonazePAM  1 mg Oral QHS   colchicine  0.6 mg Oral BID   furosemide  40 mg Intravenous Once   irbesartan  75 mg Oral Daily   lamoTRIgine  150 mg Oral Daily   methIMAzole  10 mg Oral Daily   metoprolol tartrate  25 mg Oral BID   montelukast  10 mg Oral QHS   Oxcarbazepine  300 mg Oral BID  pantoprazole  40 mg Oral Daily   pravastatin  40 mg Oral QAC breakfast   pyridostigmine  60 mg Oral Q8H   sertraline  50 mg Oral Daily   sodium chloride flush  3 mL Intravenous Q12H   spironolactone  12.5 mg Oral Daily      heparin 1,300 Units/hr (07/29/23 0405)    Assessment/Plan:  Chest Pain Patient has a history of chest pain and dyspnea on exertion. Myoview in 12/2022 was abnormal with a perfusion defect of the anterior wall consistent with ischemia. Cardiac catheterization was discussed but patient declined initially and preferred medical therapy. Presents with clinical picture pleuro pericarditis with flat troponin and transient diffuse ST elevation. Small pericardial effusion on TTE with RWMA in septum and apex Plan on right /left cath Monday .   Chronic Diastolic CHF Echo in 10/2022  showed LVEF of 65-70% with mild MR/moderate MS, mild TR, and moderate pulmonary hypertension. BNP elevated this admission at 733 >> 1,109>>574. Chest x-ray showed a large right pleural effusion but no overt pulmonary edema. Does not appear grossly volume overloaded on exam. Continue home Spironolactone 25mg  daily. Per review of Care Everywhere, also looks like she has been on Lasix 20mg  daily. This was restarted but again stopped this admission. I think gentle diuresis is appropriate. - No I/Os have been documented unfortunately   New Onset Atrial Fibrillation Recurrent episodes this admission.  Maintaining sinus on PO amiodarone CHADVASC 6 - on heparin; may switch to eliquis after interventions complete   Right Pleural Effusion Large right pleural effusion. S/p thoracentesis 07/26/23 with removal of 1.3 L of fluid.  Suspect serositis > CHF since she did not have significant edema or pulmonary congestion.   Mitral Stenosis Echo in 10/2023 at [facility] showed moderate mitral stenosis and mild mitral regurgitation. Can reassess on repeat Echo which has already been ordered.   Hypertension BP markedly elevated on admission and as high as 215/99. However, has since improved. Continue Amlodipine 5mg  daily and Irbesartan 300mg  daily (on Amlodipine-Olmesartan 5-40mg  daily at home). Continue Spironolactone 25mg  daily. Continue Lopressor to 25mg  twice daily.  Pericarditis:  transient diffuse ECG changes small effusion on TTE in association with pleuritis and pleural effusion ESR 54 CRP 15.6 Colchicine started ASA avoid NSAID's with need for anticoagulation Consider 20 panel resp for viral etiology   Otherwise, per primary team: - Hyperthyroidism (recent started on Methimazole) - GERD - Sjogren's syndrome - Rheumatoid arthritis - Chronic back pain - Lewy-body dementia - Hyperlipidemia  Roberts Gaudy  07/29/2023, 9:10 AM

## 2023-07-29 NOTE — Progress Notes (Signed)
PROGRESS NOTE                                                                                                                                                                                                             Patient Demographics:    Kristen Hansen, is a 77 y.o. female, DOB - 04/22/1946, XBJ:[id number]  Outpatient Primary MD for the patient is Kerin Salen, PA-C    LOS - 4  Admit date - 07/25/2023    Chief Complaint  Patient presents with   Chest Pain       Brief Narrative (HPI from H&P)   Kristen Hansen is a 77 y.o. female with medical history significant of hypertension, hyperlipidemia, Lewy body dementia, Sjogren's syndrome, rheumatoid arthritis, chronic pain, DVT, spinal stenosis, diastolic CHF, depression, REM behavior disorder, orthostatic hypotension, CKD 3, GERD, hyperthyroidism presenting with chest pain. Her chest pain has atypical nature and present for 2 weeks intermittently. She was found to have a moderate pleural effusion. Cardiology was consulted and they are planning a catheterization given her cardiac history.     Subjective:   States doing well. Slept well last night. States when working with PT she does not feel dizzy or unstable.   Assessment  & Plan :   Assessment and Plan:  Chest Pain, exudative and recurrent right-sided pleural Effusion, Pericardial Effusion - Pt appears euvolemic on exam. She does have moderate pleural effusion which is increasing in size. This may be contributing to her chest pain as her pain is right sided but states she does have left sided pain as well. She had thoracentesis 07/26/23 with 1.3 L removed. Pleural fluid studies consistent with exudative effusion.  Likely inflammatory due to his underlying Sjogren's seen by pulmonary this admission continue to monitor chest x-ray intermittently.  Will have her follow-up with pulmonary outpatient along with  rheumatology.  Acute on chronic diastolic CHF EF 50 to 55%, mild pericardial effusion and some preceding history of exertional shortness of breath.   has mild pericardial effusion along with exertional chest pain for which cardiology is following and scheduled her for left heart cath on 07/30/2023, for now on colchicine for pericardial effusion along with aspirin per cardiology.  Diuresis as tolerated by blood pressure and electrolytes.  Paroxysmal Atrial Fibrillation: Cardiology following. On amiodarone. Will continue heparin gtt.  Orthostatic Hypotension: Pt with orthostatic hypotension. Suspect this is secondary to her lewy body dementia. She is asymptomatic. Encourage her to change positions slowly and report symptoms of dizziness. Requested nurse to place ordered ted hose this am.  Hyponatremia: Na 131, Improved from 130 previously. Urine studies ordered. Urine osm 682, urine sodium 125 but on lasix. Urine urea pending. Serum osm is 275.   Hypomagnesemia/Hypokalemia: Replete as needed for goal K>4 and Mag>2.  Hx of DVT: D dimer elevated,  CTA negative, lower extremity doppler which was negative for DVT.  Now on heparin drip for A-fib.     Hyperthyroidism: Recently started on methimazole. States she is complaint with this. Will continue here. FT4 normal and FT3 slightly decreased at 1.7. Thyroid ultrasound ordered and shows bilateral nodules that do not meet criteria for biopsy for dedicated follow up.  Will need OP endocrinology follow up.    Left Shoulder Pain: Has good PROM but pain with active ROM. Concern for RT injury. 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 Conservative measures for now.  Overall better outpatient orthopedics follow-up postdischarge.   Dysphagia: OP work up ongoing. EGD without any acute findings. Getting swallowing therapy. SLP consult placed.  Placed on Dys 3 Diet here.   HTN: BP 131/63 this am. Will continue home meds: amlodipine 5 mg every day, irbesartan 300 mg every day, lopressor 25 mg BID. Held diuretics given orthostatic positive.   GERD: Continue home med protonix. Does not endorse symptoms of reflux.   Sjogren's Syndrome: Not on any pharmacotherapy.  Likely causing pericardial effusion and exudative right-sided pleural effusion, currently on colchicine for pericardial effusion may require outpatient rheumatology follow-up postdischarge.  RA: Was on enbrel. Last used 2 months ago. Will need rheumatology follow up OP to restart this if needed.   Chronic back pain: Tylenol prn  MDD: Zoloft 50 mg every day. Continued here.   Lewy Body Dementia: On pyridostigmine and clonzapem, Will continue here. Last saw neurologist one month ago  Trigeminal Neuralgia: On lamictal and trileptal. Both continued here.   Obesity: Estimated body mass index is 17.65 kg/m as calculated from the following:   Height as of this encounter: 5\' 10"  (1.778 m).   Weight as of this encounter: 55.8 kg.       Condition - Stable  Family Communication  : Husband updated at bedside 07/26/2023, 07/27/2023, 07/28/2023  Code Status :  Full  Consults  :  Cardiology, pulmonary  PUD Prophylaxis : PPI   Procedures  :     Right-sided thoracentesis showing exudative fluid done by IR on 07/26/2023.    Echocardiogram 1. Distal septal apical hypokinesis . Left ventricular ejection fraction, by estimation, is 50 to 55%. The left ventricle has low normal function. The left ventricle has no regional wall motion abnormalities. There is mild left ventricular hypertrophy of the basal and septal segments. Left ventricular diastolic parameters are indeterminate.  2. Right ventricular systolic function is normal. The right ventricular size is normal.  3. A small pericardial effusion is present. The pericardial effusion is anterior to the right ventricle.  4. The mitral  valve is degenerative. Mild mitral valve regurgitation. No evidence of mitral stenosis. Severe mitral annular calcification.  5. Tricuspid valve regurgitation is mild to moderate.  6. The aortic valve is tricuspid. There is moderate calcification of the aortic valve. There is moderate thickening of the aortic  valve. Aortic valve regurgitation is trivial. Aortic valve sclerosis is present, with no evidence of aortic valve stenosis.  7. The inferior vena cava is normal in size with greater than 50% respiratory variability, suggesting right atrial pressure of 3 mmHg  MRI left shoulder - 1. Motion artifact technically limits this examination. 2. 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 3. Mild supraspinatus, infraspinatus, and superior subscapularis tendinosis. No definite rotator cuff tear. 4. Mild degenerative changes of the acromioclavicular joint. Type III acromion with mild downsloping of the anterolateral acromion. 5. Mild subacromial/subdeltoid bursitis. 6. Multiple loose bodies as above.  Lower extremity venous duplex bilateral.  No DVT.  LHC/RHC on 07/30/2023      Disposition Plan  :    Status is: Inpatient Remains inpatient appropriate because: needs continued medical work up including heart cath.   DVT Prophylaxis  :    Place TED hose Start: 07/27/23 1141   Lab Results  Component Value Date   PLT 306 07/29/2023    Diet :  Diet Order             DIET DYS 3 Room service appropriate? Yes; Fluid consistency: Thin  Diet effective now                    Inpatient Medications  Scheduled Meds:  amiodarone  200 mg Oral BID   aspirin  81 mg Oral Daily   clonazePAM  1 mg Oral QHS   colchicine  0.6 mg Oral BID   irbesartan  75 mg Oral Daily   lamoTRIgine  150 mg Oral Daily   methIMAzole  10 mg Oral Daily   metoprolol tartrate  25 mg Oral BID   montelukast  10 mg Oral QHS   Oxcarbazepine  300 mg Oral BID   pantoprazole  40 mg Oral Daily   pravastatin  40 mg Oral  QAC breakfast   pyridostigmine  60 mg Oral Q8H   sertraline  50 mg Oral Daily   sodium chloride flush  3 mL Intravenous Q12H   spironolactone  12.5 mg Oral Daily   Continuous Infusions:  heparin 1,300 Units/hr (07/29/23 0405)   PRN Meds:.acetaminophen **OR** acetaminophen, diltiazem, loperamide, nitroGLYCERIN, polyethylene glycol, valACYclovir  Antibiotics  :    Anti-infectives (From admission, onward)    Start     Dose/Rate Route Frequency Ordered Stop   07/26/23 0540  valACYclovir (VALTREX) tablet 1,000 mg        1,000 mg Oral Every 12 hours PRN 07/26/23 0541           Objective:   Vitals:   07/28/23 2000 07/28/23 2127 07/29/23 0000 07/29/23 0400  BP: (!) 142/116 (!) 144/72 (!) 151/72 134/74  Pulse: 71 70 63 63  Resp: 18  14 15   Temp:   98 F (36.7 C) 97.8 F (36.6 C)  TempSrc:   Oral Oral  SpO2: 96%  94% 96%  Weight:      Height:        Wt Readings from Last 3 Encounters:  07/27/23 55.8 kg  06/11/23 61.7 kg  11/29/22 61.7 kg    No intake or output data in the 24 hours ending 07/29/23 1610   Physical Exam General: NAD, chronically ill appearing HENT: NCAT Lungs: CTAB, no wheeze, rhonchi or rales.  Cardiovascular: NSR. No LE edema Abdomen: No TTP, normal bowel sounds MSK: No asymmetry or muscle atrophy.  Skin: no lesions noted on exposed skin Neuro: Alert and oriented x4. CN grossly intact Psych:  Normal mood and normal affect     RN pressure injury documentation: None    Data Review:    Recent Labs  Lab 07/25/23 0308 07/26/23 0559 07/27/23 0146 07/28/23 0403 07/29/23 0327  WBC 12.7* 9.4 10.0 8.9 8.1  HGB 10.6* 10.5* 10.0* 9.3* 10.0*  HCT 33.1* 32.8* 30.4* 28.7* 31.3*  PLT 301 283 287 282 306  MCV 82.8 83.5 84.0 82.5 83.2  MCH 26.5 26.7 27.6 26.7 26.6  MCHC 32.0 32.0 32.9 32.4 31.9  RDW 14.4 14.5 14.4 14.4 14.3  LYMPHSABS 0.7  --  1.5 1.2 1.7  MONOABS 0.9  --  0.9 0.9 0.9  EOSABS 0.2  --  0.4 0.7* 0.7*  BASOSABS 0.0  --  0.0 0.0 0.1     Recent Labs  Lab 07/25/23 0308 07/25/23 0636 07/26/23 0559 07/26/23 0600 07/27/23 0146 07/28/23 0403 07/29/23 0327  NA 134*  --  135  --  130* 131* 131*  K 3.9  --  3.8  --  3.5 3.7 4.3  CL 102  --  100  --  98 101 102  CO2 23  --  22  --  21* 21* 21*  ANIONGAP 9  --  13  --  11 9 8   GLUCOSE 126*  --  92  --  101* 104* 95  BUN 21  --  17  --  21 20 16   CREATININE 1.04*  --  0.84  --  0.84 0.95 1.00  AST 18  --  13*  --  11* 8* 18  ALT 13  --  13  --  12 12 14   ALKPHOS 141*  --  129*  --  104 108 121  BILITOT 0.4  --  0.5  --  0.6 <0.1* 0.6  ALBUMIN 2.8*  --  2.3*  --  2.1* 1.9* 2.2*  CRP  --   --   --  15.6*  --   --   --   DDIMER 3.49*  --   --   --   --   --   --   PROCALCITON  --   --   --  0.18  --   --   --   INR  --  1.2  --   --   --   --   --   TSH  --   --   --  <0.010*  --   --   --   HGBA1C  --   --   --  5.2  --   --   --   BNP 733.2*  --   --  1,109.0* 928.2* 557.2* 574.4*  MG  --   --   --  1.8 1.7 2.2 2.0  CALCIUM 8.8*  --  8.9  --  8.4* 8.1* 8.5*      Recent Labs  Lab 07/25/23 0308 07/25/23 0636 07/26/23 0559 07/26/23 0600 07/27/23 0146 07/28/23 0403 07/29/23 0327  CRP  --   --   --  15.6*  --   --   --   DDIMER 3.49*  --   --   --   --   --   --   PROCALCITON  --   --   --  0.18  --   --   --   INR  --  1.2  --   --   --   --   --   TSH  --   --   --  <  0.010*  --   --   --   HGBA1C  --   --   --  5.2  --   --   --   BNP 733.2*  --   --  1,109.0* 928.2* 557.2* 574.4*  MG  --   --   --  1.8 1.7 2.2 2.0  CALCIUM 8.8*  --  8.9  --  8.4* 8.1* 8.5*    Recent Labs  Lab 07/25/23 0308 07/26/23 0559 07/26/23 0600 07/27/23 0146 07/28/23 0403 07/29/23 0327  WBC 12.7* 9.4  --  10.0 8.9 8.1  PLT 301 283  --  287 282 306  CRP  --   --  15.6*  --   --   --   DDIMER 3.49*  --   --   --   --   --   PROCALCITON  --   --  0.18  --   --   --   CREATININE 1.04* 0.84  --  0.84 0.95 1.00     ------------------------------------------------------------------------------------------------------------------ Lab Results  Component Value Date   CHOL 144 07/26/2023   HDL 55 07/26/2023   LDLCALC 74 07/26/2023   TRIG 76 07/26/2023   CHOLHDL 2.6 07/26/2023    Lab Results  Component Value Date   HGBA1C 5.2 07/26/2023    Recent Labs    07/26/23 1406  T3FREE 1.7*   ------------------------------------------------------------------------------------------------------------------ Cardiac Enzymes No results for input(s): "CKMB", "TROPONINI", "MYOGLOBIN" in the last 168 hours.  Invalid input(s): "CK"  Micro Results Recent Results (from the past 240 hour(s))  Culture, body fluid w Gram Stain-bottle     Status: None (Preliminary result)   Collection Time: 07/26/23 12:33 PM   Specimen: Pleura  Result Value Ref Range Status   Specimen Description PLEURAL  Final   Special Requests PLEURAL  Final   Culture   Final    NO GROWTH 2 DAYS Performed at [facility], [address] [place], [place] [address]    Report Status PENDING  Incomplete  Gram stain     Status: None   Collection Time: 07/26/23 12:33 PM   Specimen: Pleura  Result Value Ref Range Status   Specimen Description PLEURAL  Final   Special Requests NONE  Final   Gram Stain   Final    RARE WBC PRESENT,BOTH PMN AND MONONUCLEAR NO ORGANISMS SEEN Performed at [facility], [address] [place], [place] [address]    Report Status 07/26/2023 FINAL  Final  Respiratory (~20 pathogens) panel by PCR     Status: None   Collection Time: 07/27/23 12:28 PM   Specimen: Nasopharyngeal Swab; Respiratory  Result Value Ref Range Status   Adenovirus NOT DETECTED NOT DETECTED Final   Coronavirus 229E NOT DETECTED NOT DETECTED Final    Comment: (NOTE) The Coronavirus on the Respiratory Panel, DOES NOT test for the novel  Coronavirus (2019 nCoV)    Coronavirus HKU1 NOT DETECTED NOT DETECTED Final    Coronavirus NL63 NOT DETECTED NOT DETECTED Final   Coronavirus OC43 NOT DETECTED NOT DETECTED Final   Metapneumovirus NOT DETECTED NOT DETECTED Final   Rhinovirus / Enterovirus NOT DETECTED NOT DETECTED Final   Influenza A NOT DETECTED NOT DETECTED Final   Influenza B NOT DETECTED NOT DETECTED Final   Parainfluenza Virus 1 NOT DETECTED NOT DETECTED Final   Parainfluenza Virus 2 NOT DETECTED NOT DETECTED Final   Parainfluenza Virus 3 NOT DETECTED NOT DETECTED Final   Parainfluenza Virus 4  NOT DETECTED NOT DETECTED Final   Respiratory Syncytial Virus NOT DETECTED NOT DETECTED Final   Bordetella pertussis NOT DETECTED NOT DETECTED Final   Bordetella Parapertussis NOT DETECTED NOT DETECTED Final   Chlamydophila pneumoniae NOT DETECTED NOT DETECTED Final   Mycoplasma pneumoniae NOT DETECTED NOT DETECTED Final    Comment: Performed at [facility], [address], [place], [place] [address]    Radiology Reports DG CHEST PORT 1 VIEW  Result Date: 07/28/2023 CLINICAL DATA:  Follow-up pleural effusion EXAM: PORTABLE CHEST 1 VIEW COMPARISON:  07/27/2023 FINDINGS: Artifact overlies the chest. Left chest remains clear. Similar mount of pleural density on the right with volume loss in the right lower lung. No worsening or new finding. IMPRESSION: No worsening or new finding. Persistent pleural density on the right with volume loss. Electronically Signed   By: Paulina Fusi M.D.   On: 07/28/2023 09:54   US THYROID  Result Date: 07/27/2023 CLINICAL DATA:  Hyperthyroidism. EXAM: THYROID ULTRASOUND TECHNIQUE: Ultrasound examination of the thyroid gland and adjacent soft tissues was performed. COMPARISON:  None Available. FINDINGS: Parenchymal Echotexture: Mildly heterogenous Isthmus: 0.2 cm Right lobe: 5.1 x 2.0 x 1.7 cm Left lobe: 4.4 x 1.6 x 1.6 cm _________________________________________________________ Estimated total number of nodules >/= 1 cm: 2 Number of spongiform nodules >/=  2 cm not described  below (TR1): 0 Number of mixed cystic and solid nodules >/= 1.5 cm not described below (TR2): 0 _________________________________________________________ Nodule #1 is a hyperechoic nodule in the right inferior thyroid lobe. This nodule measures up to 0.6 cm and does not meet criteria for biopsy or dedicated follow-up. Nodule # 2: Location: Left; Superior Maximum size: 1.0 cm; Other 2 dimensions: 0.4 x 0.7 cm Composition: solid/almost completely solid (2) Echogenicity: hyperechoic (1) Shape: not taller-than-wide (0) Margins: ill-defined (0) Echogenic foci: none (0) ACR TI-RADS total points: 3. ACR TI-RADS risk category: TR3 (3 points). ACR TI-RADS recommendations: Given size (<1.4 cm) and appearance, this nodule does NOT meet TI-RADS criteria for biopsy or dedicated follow-up. _________________________________________________________ Nodule # 3: Location: Left; Inferior Maximum size: 1.0 cm; Other 2 dimensions: 0.6 x 0.6 cm Composition: solid/almost completely solid (2) Echogenicity: hyperechoic (1) Shape: not taller-than-wide (0) Margins: ill-defined (0) Echogenic foci: none (0) ACR TI-RADS total points: 3. ACR TI-RADS risk category: TR3 (3 points). ACR TI-RADS recommendations: Given size (<1.4 cm) and appearance, this nodule does NOT meet TI-RADS criteria for biopsy or dedicated follow-up. _________________________________________________________ Thyroid tissue is hypervascular. IMPRESSION: 1. Thyroid tissue is hypervascular and mildly heterogeneous. 2. Bilateral thyroid nodules that do not meet criteria for biopsy or dedicated follow-up. The above is in keeping with the ACR TI-RADS recommendations - J Am Coll Radiol 2017;14:587-595. Electronically Signed   By: Richarda Overlie M.D.   On: 07/27/2023 07:38   DG Chest Port 1 View  Result Date: 07/27/2023 CLINICAL DATA:  Shortness of breath.  Recent pleural effusion. EXAM: PORTABLE CHEST 1 VIEW COMPARISON:  07/26/23 FINDINGS: Normal heart size. Right pleural effusion is  again noted. There is been interval worsening aeration to the right mid and right lower lung which is favored to represent interval reaccumulation of right pleural effusion with progressive veil like opacification of the right lower lung. Cannot exclude superimposed airspace disease or atelectasis. Scar like opacities noted in the right upper lobe. IMPRESSION: Interval worsening aeration to the right mid and right lower lung which is favored to represent interval reaccumulation of right pleural effusion with progressive veil like opacification of the right lower  lung. Cannot exclude superimposed airspace disease or atelectasis. Electronically Signed   By: Signa Kell M.D.   On: 07/27/2023 07:03   VAS [place] LOWER EXTREMITY VENOUS (DVT)  Result Date: 07/26/2023  Lower Venous DVT Study Patient Name:  YAMILEE HARMES Salaz  Date of Exam:   07/26/2023 Medical Rec #: [id number]      Accession #:    [id number] Date of Birth: 12-31-1945      Patient Gender: F Patient Age:   85 years Exam Location:  [facility] Procedure:      VAS [place] LOWER EXTREMITY VENOUS (DVT) Referring Phys: Bess Harvest [facility] --------------------------------------------------------------------------------  Indications: Swelling, and "rapidly rising D-Dimer".  Limitations: Body habitus and poor ultrasound/tissue interface. Comparison Study: No previous study. Performing Technologist: McKayla Maag RVT, [place]  Examination Guidelines: A complete evaluation includes B-mode imaging, spectral Doppler, color Doppler, and power Doppler as needed of all accessible portions of each vessel. Bilateral testing is considered an integral part of a complete examination. Limited examinations for reoccurring indications may be performed as noted. The reflux portion of the exam is performed with the patient in reverse Trendelenburg.  +---------+---------------+---------+-----------+----------+--------------+ RIGHT    CompressibilityPhasicitySpontaneityPropertiesThrombus  Aging +---------+---------------+---------+-----------+----------+--------------+ CFV      Full           Yes      Yes                                 +---------+---------------+---------+-----------+----------+--------------+ SFJ      Full                                                        +---------+---------------+---------+-----------+----------+--------------+ FV Prox  Full                                                        +---------+---------------+---------+-----------+----------+--------------+ FV Mid   Full                                                        +---------+---------------+---------+-----------+----------+--------------+ FV DistalFull                                                        +---------+---------------+---------+-----------+----------+--------------+ PFV      Full                                                        +---------+---------------+---------+-----------+----------+--------------+ POP      Full           Yes      Yes                                 +---------+---------------+---------+-----------+----------+--------------+  PTV      Full                                                        +---------+---------------+---------+-----------+----------+--------------+ PERO     Full                                                        +---------+---------------+---------+-----------+----------+--------------+   +---------+---------------+---------+-----------+----------+--------------+ LEFT     CompressibilityPhasicitySpontaneityPropertiesThrombus Aging +---------+---------------+---------+-----------+----------+--------------+ CFV      Full           Yes      Yes                                 +---------+---------------+---------+-----------+----------+--------------+ SFJ      Full                                                         +---------+---------------+---------+-----------+----------+--------------+ FV Prox  Full                                                        +---------+---------------+---------+-----------+----------+--------------+ FV Mid   Full                                                        +---------+---------------+---------+-----------+----------+--------------+ FV DistalFull                                                        +---------+---------------+---------+-----------+----------+--------------+ PFV      Full                                                        +---------+---------------+---------+-----------+----------+--------------+ POP                                                   Not visualized +---------+---------------+---------+-----------+----------+--------------+ PTV      Full                                                        +---------+---------------+---------+-----------+----------+--------------+  PERO     Full                                         limited        +---------+---------------+---------+-----------+----------+--------------+     Summary: RIGHT: - There is no evidence of deep vein thrombosis in the lower extremity.  - No cystic structure found in the popliteal fossa.  LEFT: - There is no evidence of deep vein thrombosis in the lower extremity. However, portions of this examination were limited- see technologist comments above.  - No cystic structure found in the popliteal fossa.  *See table(s) above for measurements and observations. Electronically signed by Sherald Hess MD on 07/26/2023 at 5:37:26 PM.    Final    ECHOCARDIOGRAM COMPLETE  Result Date: 07/26/2023    ECHOCARDIOGRAM REPORT   Patient Name:   DASHIA CALDEIRA Grein Date of Exam: 07/26/2023 Medical Rec #:  [id number]     Height:       70.0 in Accession #:    [id number]    Weight:       124.0 lb Date of Birth:  14-Feb-1946     BSA:          1.704 m Patient Age:    77  years      BP:           132/80 mmHg Patient Gender: F             HR:           73 bpm. Exam Location:  Inpatient Procedure: 2D Echo, Cardiac Doppler and Color Doppler Indications:    CHF-Acute Diastolic I50.31  History:        Patient has no prior history of Echocardiogram examinations.                 CHF, Signs/Symptoms:Hypotension and Chest Pain; Risk                 Factors:Hypertension and Dyslipidemia. CKD, stage 3.  Sonographer:    Lucendia Herrlich Referring Phys: Effie Shy PRASHANT K [facility] IMPRESSIONS  1. Distal septal apical hypokinesis . Left ventricular ejection fraction, by estimation, is 50 to 55%. The left ventricle has low normal function. The left ventricle has no regional wall motion abnormalities. There is mild left ventricular hypertrophy of the basal and septal segments. Left ventricular diastolic parameters are indeterminate.  2. Right ventricular systolic function is normal. The right ventricular size is normal.  3. A small pericardial effusion is present. The pericardial effusion is anterior to the right ventricle.  4. The mitral valve is degenerative. Mild mitral valve regurgitation. No evidence of mitral stenosis. Severe mitral annular calcification.  5. Tricuspid valve regurgitation is mild to moderate.  6. The aortic valve is tricuspid. There is moderate calcification of the aortic valve. There is moderate thickening of the aortic valve. Aortic valve regurgitation is trivial. Aortic valve sclerosis is present, with no evidence of aortic valve stenosis.  7. The inferior vena cava is normal in size with greater than 50% respiratory variability, suggesting right atrial pressure of 3 mmHg. FINDINGS  Left Ventricle: Distal septal apical hypokinesis. Left ventricular ejection fraction, by estimation, is 50 to 55%. The left ventricle has low normal function. The left ventricle has no regional wall motion abnormalities. The left ventricular internal cavity size was normal in size. There is mild left  ventricular hypertrophy  of the basal and septal segments. Left ventricular diastolic parameters are indeterminate. Right Ventricle: The right ventricular size is normal. No increase in right ventricular wall thickness. Right ventricular systolic function is normal. Left Atrium: Left atrial size was normal in size. Right Atrium: Right atrial size was normal in size. Pericardium: A small pericardial effusion is present. The pericardial effusion is anterior to the right ventricle. Mitral Valve: The mitral valve is degenerative in appearance. There is moderate thickening of the mitral valve leaflet(s). There is moderate calcification of the mitral valve leaflet(s). Severe mitral annular calcification. Mild mitral valve regurgitation. No evidence of mitral valve stenosis. MV peak gradient, 10.4 mmHg. The mean mitral valve gradient is 3.0 mmHg. Tricuspid Valve: The tricuspid valve is normal in structure. Tricuspid valve regurgitation is mild to moderate. No evidence of tricuspid stenosis. Aortic Valve: The aortic valve is tricuspid. There is moderate calcification of the aortic valve. There is moderate thickening of the aortic valve. Aortic valve regurgitation is trivial. Aortic valve sclerosis is present, with no evidence of aortic valve  stenosis. Aortic valve peak gradient measures 4.8 mmHg. Pulmonic Valve: The pulmonic valve was normal in structure. Pulmonic valve regurgitation is mild. No evidence of pulmonic stenosis. Aorta: The aortic root is normal in size and structure. Venous: The inferior vena cava is normal in size with greater than 50% respiratory variability, suggesting right atrial pressure of 3 mmHg. IAS/Shunts: No atrial level shunt detected by color flow Doppler.  LEFT VENTRICLE PLAX 2D LVIDd:         3.10 cm   Diastology LVIDs:         1.90 cm   LV e' medial:    4.51 cm/s LV PW:         1.30 cm   LV E/e' medial:  26.2 LV IVS:        1.00 cm   LV e' lateral:   4.32 cm/s LVOT diam:     1.90 cm   LV E/e'  lateral: 27.3 LV SV:         48 LV SV Index:   28 LVOT Area:     2.84 cm  RIGHT VENTRICLE             IVC RV S prime:     18.40 cm/s  IVC diam: 1.60 cm TAPSE (M-mode): 2.0 cm LEFT ATRIUM             Index        RIGHT ATRIUM           Index LA diam:        3.60 cm 2.11 cm/m   RA Area:     12.90 cm LA Vol (A2C):   64.7 ml 37.98 ml/m  RA Volume:   29.60 ml  17.38 ml/m LA Vol (A4C):   67.5 ml 39.62 ml/m LA Biplane Vol: 73.2 ml 42.97 ml/m  AORTIC VALVE AV Area (Vmax): 2.36 cm AV Vmax:        109.00 cm/s AV Peak Grad:   4.8 mmHg LVOT Vmax:      90.80 cm/s LVOT Vmean:     58.367 cm/s LVOT VTI:       0.171 m  AORTA Ao Root diam: 3.30 cm Ao Asc diam:  2.80 cm MITRAL VALVE                TRICUSPID VALVE MV Area (PHT): 3.85 cm     TR Peak grad:   43.6 mmHg MV Area  VTI:   1.12 cm     TR Vmax:        330.00 cm/s MV Peak grad:  10.4 mmHg MV Mean grad:  3.0 mmHg     SHUNTS MV Vmax:       1.61 m/s     Systemic VTI:  0.17 m MV Vmean:      77.2 cm/s    Systemic Diam: 1.90 cm MV Decel Time: 197 msec MV E velocity: 118.00 cm/s MV A velocity: 107.00 cm/s MV E/A ratio:  1.10 Charlton Haws MD Electronically signed by Charlton Haws MD Signature Date/Time: 07/26/2023/2:19:09 PM    Final    MR SHOULDER LEFT WO CONTRAST  Result Date: 07/26/2023 CLINICAL DATA:  Chronic left shoulder pain. Rotator cuff disorder suspected. EXAM: MRI OF THE LEFT SHOULDER WITHOUT CONTRAST TECHNIQUE: Multiplanar, multisequence MR imaging of the shoulder was performed. No intravenous contrast was administered. COMPARISON:  None Available. FINDINGS: Despite efforts by the technologist and patient, motion artifact is present on today's exam and could not be eliminated. This reduces exam sensitivity and specificity. Rotator cuff: Within the limitations of patient motion artifact, there appears to be mild supraspinatus and infraspinatus tendinosis. No definite tendon tear. There is a focus of decreased T1 increased T2 signal cystic change versus focal marrow  edema within the humeral head just deep to the slightly anterior aspect of the supraspinatus tendon footprint measuring up to 12 x 9 mm (AP by craniocaudal). Mild superior subscapularis intermediate T2 signal tendinosis. The teres minor is intact. Muscles:  Moderate supraspinatus muscle atrophy. Biceps long head: The intra-articular long head of the biceps tendon is intact. Acromioclavicular Joint: There are mild degenerative changes of the acromioclavicular joint including joint space narrowing and peripheral osteophytosis. Type III acromion with mild downsloping of the anterolateral acromion. Mild fluid within the subacromial/subdeltoid bursa. Glenohumeral Joint: Moderate to severe glenohumeral cartilage thinning. Moderate inferior humeral head-neck junction degenerative osteophytosis. Mild glenoid fossa and medial humeral head cortical flattening/remodeling. Labrum: Mild-to-moderate attenuation and degenerative irregularity of the posterior glenoid labrum. Bones:  No acute fracture. Other: Mild joint fluid extends into the subcoracoid recess in this region there is a 15 x 12 x 10 mm low signal loose body (sagittal series 6, image 18, coronal series 4, image 14, and axial series 2, image 11). Within the inferior axillary pouch there is marrow fat intensity likely loose body measuring up to 2.2 cm (sagittal series 7, image 12). IMPRESSION: 1. Motion artifact technically limits this examination. 2. 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 3. Mild supraspinatus, infraspinatus, and superior subscapularis tendinosis. No definite rotator cuff tear. 4. Mild degenerative changes of the acromioclavicular joint. Type III acromion with mild downsloping of the anterolateral acromion. 5. Mild subacromial/subdeltoid bursitis. 6. Multiple loose bodies as above. Electronically Signed   By: Neita Garnet M.D.   On: 07/26/2023 13:58   IR THORACENTESIS ASP PLEURAL SPACE W/IMG GUIDE  Result Date: 07/26/2023 INDICATION:  Patient with a history of heart failure presents today with new onset right pleural effusion. Interventional Radiology asked to perform a diagnostic and therapeutic thoracentesis. EXAM: ULTRASOUND GUIDED THORACENTESIS MEDICATIONS: 1% lidocaine 10 ml COMPLICATIONS: None immediate. PROCEDURE: An ultrasound guided thoracentesis was thoroughly discussed with the patient and questions answered. The benefits, risks, alternatives and complications were also discussed. The patient understands and wishes to proceed with the procedure. Written consent was obtained. Ultrasound was performed to localize and mark an adequate pocket of fluid in the right chest. The area was then prepped and draped  in the normal sterile fashion. 1% Lidocaine was used for local anesthesia. Under ultrasound guidance a 6 Fr Safe-T-Centesis catheter was introduced. Thoracentesis was performed. The catheter was removed and a dressing applied. FINDINGS: A total of approximately 1.3 L of clear yellow fluid was removed. Samples were sent to the laboratory as requested by the clinical team. IMPRESSION: Successful ultrasound guided right thoracentesis yielding 1.3 L of pleural fluid. Procedure performed by: Alwyn Ren, NP and supervised by Dr. Fredia Sorrow Electronically Signed   By: Irish Lack M.D.   On: 07/26/2023 13:48   DG Chest 1 View  Result Date: 07/26/2023 CLINICAL DATA:  Status post thoracentesis EXAM: CHEST  1 VIEW COMPARISON:  07/25/2023 FINDINGS: Diminished fluid volume of a loculated lateral and basal right hydropneumothorax, with moderate, layering persistent fluid volume and associated pleural thickening. Left lung is normally aerated. Heart and mediastinum are normal. IMPRESSION: Diminished fluid volume of a loculated lateral and basal right hydropneumothorax, with moderate, layering persistent fluid volume and associated pleural thickening. Electronically Signed   By: Jearld Lesch M.D.   On: 07/26/2023 13:10      Signature  -    Gwenevere Abbot M.D on 07/29/2023 at 6:32 AM   -  To [place] go to [email or web address]

## 2023-07-29 NOTE — Progress Notes (Signed)
Occupational Therapy Treatment Patient Details Name: Kristen Hansen MRN: [id number] DOB: 02-02-1946 Today's Date: 07/29/2023   History of present illness Pt is 77 yo female presenting to [facility] ED with chest pain. Chest pain has been moving and is increased with palpation. PMH: HTN, hyperlipidemia, Lewy body dementia, Sjogren's syndrome, rheumatoid arthritis, chronic pain, DVT, Spine disease, spinal stenosis, diastolic CHF, depression, REM behavior disorder, orthostatic Hypotension, CKD 3, GERD, hypothrydoidism.   OT comments  Patient in supine and willing to participate with OT. BP while supine 155/84. Patient was mod assist to get to EOB and BP seated was 130/62 with patient stating dizziness and required increased time before attempting to ambulate to sink. Patient was min assist to stand and min assist to min guard assist to ambulate to door and turn to sit at sink with BP at 163/70 and light dizziness. Following rest patient able to ambulate to recliner and patient stated she felt nauseous. Patient with BP 162/77 and began feeling better with chair reclined and feet elevated. Discharge recommendations continue to be appropriate. Acute OT to continue to follow.    Recommendations for follow up therapy are one component of a multi-disciplinary discharge planning process, led by the attending physician.  Recommendations may be updated based on patient status, additional functional criteria and insurance authorization.    Assistance Recommended at Discharge Frequent or constant Supervision/Assistance  Patient can return home with the following  A lot of help with walking and/or transfers;A lot of help with bathing/dressing/bathroom;Assistance with cooking/housework;Direct supervision/assist for medications management;Direct supervision/assist for financial management;Help with stairs or ramp for entrance;Assist for transportation;Assistance with feeding   Equipment Recommendations  None recommended by OT     Recommendations for Other Services      Precautions / Restrictions Precautions Precautions: Fall Precaution Comments: watch BP Restrictions Weight Bearing Restrictions: No       Mobility Bed Mobility Overal bed mobility: Needs Assistance Bed Mobility: Supine to Sit     Supine to sit: Mod assist     General bed mobility comments: assistance to raise trunk and scoot to EOB    Transfers Overall transfer level: Needs assistance Equipment used: Rolling walker (2 wheels) Transfers: Sit to/from Stand Sit to Stand: Min assist           General transfer comment: min assist to power up and min guard to min assist for transfrs     Balance Overall balance assessment: Needs assistance Sitting-balance support: Single extremity supported, No upper extremity supported, Feet supported Sitting balance-Leahy Scale: Fair Sitting balance - Comments: supervision for sitting balance   Standing balance support: Bilateral upper extremity supported Standing balance-Leahy Scale: Poor Standing balance comment: reliant on RW for support                           ADL either performed or assessed with clinical judgement   ADL Overall ADL's : Needs assistance/impaired                     Lower Body Dressing: Moderate assistance;Sit to/from stand Lower Body Dressing Details (indicate cue type and reason): donned pull up brief and mod assist to pull up                    Extremity/Trunk Assessment              Vision       Perception     Praxis  Cognition Arousal/Alertness: Awake/alert Behavior During Therapy: WFL for tasks assessed/performed Overall Cognitive Status: History of cognitive impairments - at baseline                                 General Comments: able to follow commands        Exercises      Shoulder Instructions       General Comments BP in supine 155/84, seated on EOB 130/62, after ambulating to  sink 163/70, seated in recliner at end of session 162/77    Pertinent Vitals/ Pain       Pain Assessment Pain Assessment: Faces Faces Pain Scale: No hurt  Home Living                                          Prior Functioning/Environment              Frequency  Min 1X/week        Progress Toward Goals  OT Goals(current goals can now be found in the care plan section)  Progress towards OT goals: Progressing toward goals  Acute Rehab OT Goals Patient Stated Goal: get better OT Goal Formulation: With patient/family Time For Goal Achievement: 08/10/23 Potential to Achieve Goals: Good ADL Goals Pt Will Perform Upper Body Bathing: with supervision;sitting Pt Will Perform Lower Body Bathing: sitting/lateral leans;sit to/from stand;with min guard assist Pt Will Perform Lower Body Dressing: sit to/from stand;sitting/lateral leans;with min guard assist Pt Will Transfer to Toilet: with min guard assist;ambulating;bedside commode Pt Will Perform Toileting - Clothing Manipulation and hygiene: with min guard assist;sitting/lateral leans;sit to/from stand Pt/caregiver will Perform Home Exercise Program: Increased strength;Both right and left upper extremity;With theraband;With theraputty;With minimal assist;With written HEP provided  Plan Discharge plan remains appropriate    Co-evaluation                 AM-PAC OT "6 Clicks" Daily Activity     Outcome Measure   Help from another person eating meals?: A Little Help from another person taking care of personal grooming?: A Little Help from another person toileting, which includes using toliet, bedpan, or urinal?: A Lot Help from another person bathing (including washing, rinsing, drying)?: A Lot Help from another person to put on and taking off regular upper body clothing?: A Little Help from another person to put on and taking off regular lower body clothing?: A Lot 6 Click Score: 15    End of Session  Equipment Utilized During Treatment: Gait belt;Rolling walker (2 wheels)  OT Visit Diagnosis: Unsteadiness on feet (R26.81);Other abnormalities of gait and mobility (R26.89);Muscle weakness (generalized) (M62.81);Other symptoms and signs involving cognitive function   Activity Tolerance Other (comment) (felt nauseous at end of session)   Patient Left in chair;with call bell/phone within reach;with family/visitor present   Nurse Communication Mobility status;Other (comment) (BP readings and patient feeling nauseous)        Time: 1428-1500 OT Time Calculation (min): 32 min  Charges: OT General Charges $OT Visit: 1 Visit OT Treatments $Self Care/Home Management : 8-22 mins $Therapeutic Activity: 8-22 mins  Alfonse Flavors, OTA Acute Rehabilitation Services  Office [phone]   Dewain Penning 07/29/2023, 3:10 PM

## 2023-07-29 NOTE — Plan of Care (Signed)
  Problem: Education: Goal: Knowledge of General Education information will improve Description: Including pain rating scale, medication(s)/side effects and non-pharmacologic comfort measures Outcome: Progressing   Problem: Health Behavior/Discharge Planning: Goal: Ability to manage health-related needs will improve Outcome: Progressing   Problem: Clinical Measurements: Goal: Ability to maintain clinical measurements within normal limits will improve Outcome: Progressing Goal: Will remain free from infection Outcome: Progressing Goal: Diagnostic test results will improve Outcome: Progressing Goal: Respiratory complications will improve Outcome: Progressing Goal: Cardiovascular complication will be avoided Outcome: Progressing   Problem: Activity: Goal: Risk for activity intolerance will decrease Outcome: Progressing   Problem: Nutrition: Goal: Adequate nutrition will be maintained Outcome: Progressing   Problem: Coping: Goal: Level of anxiety will decrease Outcome: Progressing   Problem: Elimination: Goal: Will not experience complications related to bowel motility Outcome: Progressing Goal: Will not experience complications related to urinary retention Outcome: Progressing   Problem: Pain Managment: Goal: General experience of comfort will improve Outcome: Progressing   Problem: Safety: Goal: Ability to remain free from injury will improve Outcome: Progressing   Problem: Skin Integrity: Goal: Risk for impaired skin integrity will decrease Outcome: Progressing   Problem: Activity: Goal: Ability to tolerate increased activity will improve Outcome: Progressing   Problem: Clinical Measurements: Goal: Ability to maintain a body temperature in the normal range will improve Outcome: Progressing   Problem: Respiratory: Goal: Ability to maintain adequate ventilation will improve Outcome: Progressing Goal: Ability to maintain a clear airway will improve Outcome:  Progressing   Problem: Education: Goal: Understanding of CV disease, CV risk reduction, and recovery process will improve Outcome: Progressing Goal: Individualized Educational Video(s) Outcome: Progressing   Problem: Activity: Goal: Ability to return to baseline activity level will improve Outcome: Progressing   Problem: Cardiovascular: Goal: Ability to achieve and maintain adequate cardiovascular perfusion will improve Outcome: Progressing Goal: Vascular access site(s) Level 0-1 will be maintained Outcome: Progressing   Problem: Health Behavior/Discharge Planning: Goal: Ability to safely manage health-related needs after discharge will improve Outcome: Progressing

## 2023-07-29 NOTE — Procedures (Signed)
Insertion of Chest Tube Procedure Note  Kristen Hansen  [id number]  April 09, 1946  Date:07/29/23  Time:5:26 PM    Provider Performing: Charlott Holler   Procedure: Chest Tube Insertion [id number])  Indication(s) Effusion  Consent Risks of the procedure as well as the alternatives and risks of each were explained to the patient and/or caregiver.  Consent for the procedure was obtained and is signed in the bedside chart  Anesthesia Topical only with 1% lidocaine    Time Out Verified patient identification, verified procedure, site/side was marked, verified correct patient position, special equipment/implants available, medications/allergies/relevant history reviewed, required imaging and test results available.   Sterile Technique Maximal sterile technique including full sterile barrier drape, hand hygiene, sterile gown, sterile gloves, mask, hair covering, sterile ultrasound probe cover (if used).   Procedure Description Ultrasound used to identify appropriate pleural anatomy for placement and overlying skin marked. Area of placement cleaned and draped in sterile fashion.  A 14 French pigtail pleural catheter was placed into the right pleural space using Seldinger technique. Appropriate return of fluid was obtained.  The tube was connected to atrium and placed on -20 cm H2O wall suction.   Complications/Tolerance None; patient tolerated the procedure well. Chest X-ray is ordered to verify placement.   EBL Minimal  Specimen(s) none  Kristen Salts, MD Pulmonary and Critical Care Medicine [facility] 07/29/2023 5:27 PM Pager: see AMION  If no response to pager, please call critical care on call (see AMION) until 7pm After 7:00 pm call Elink      Pleural fluid, right space, with free-flowing fluid under the diaphragm.

## 2023-07-29 NOTE — Consult Note (Signed)
   NAME:  Kristen Hansen, MRN:  [id number], DOB:  31-Jul-1946, LOS: 4 ADMISSION DATE:  07/25/2023, CONSULTATION DATE: 07/27/2023 REFERRING MD: Dr. Thedore Mins, CHIEF COMPLAINT: Pleural effusion  History of Present Illness:  Asked to see for recurrent pleural effusion Recently had a thoracenteses for 1.1 L of exudative fluid  Denies any significant complaints at present  Presented to the hospital with chest pain, shortness of breath with activity  Pertinent  Medical History   Past Medical History:  Diagnosis Date   Back pain    Cancer (HCC)    Skin, basal cell   Dyspnea    Fatigue    GERD (gastroesophageal reflux disease)    Headache(784.0)    history of migraines   History of kidney stones    Hypercholesteremia    Hypertension    Leg swelling    Bilateral   Occasional tremors    mild hand tremors   Osteoarthritis    PONV (postoperative nausea and vomiting)    RBD (REM behavioral disorder)    Rheumatoid arthritis(714.0)    Scoliosis of lumbar spine    Spinal stenosis    Multiple levels   [place]    Trigeminal neuralgia 03/30/2015   Vision abnormalities    Significant Hospital Events: Including procedures, antibiotic start and stop dates in addition to other pertinent events   8/1 thoracentesis 1.3 L  Interim History / Subjective:  No sob but she hasn't gotten up much. SLP evaluation confirms aspiration  Objective   Blood pressure 134/74, pulse 63, temperature 97.8 F (36.6 C), temperature source Oral, resp. rate 15, height 5\' 10"  (1.778 m), weight 55.8 kg, SpO2 96%.       No intake or output data in the 24 hours ending 07/29/23 1521 Filed Weights   07/25/23 0251 07/27/23 0110  Weight: 56.2 kg 55.8 kg    Examination: General: thin, no distress, frail Lungs: laying flat comfortably, breathing nonlabored Cardiovascular: RRR, systolic murmur Abdomen: soft, nontender Extremities: No clubbing, no edema Neuro: alert and oriented x 4, normal speech  Right pleural  space with free-flowing effusion  Resolved Hospital Problem list     Assessment & Plan:  Right sided pleural effusion Persistent right sided pleural based density - differential diagnosis serositis due to pleuropericarditis, aspiration related reactive effusion - very mildly exudative - would call this a pseudo-exudative process in the setting of furosemide use.  - cytology pending - free flowing effusion noted on the right pleural space which reaccumulated quickly. If this is related to the inflammatory process should improve with anti-inflammatory medications for the pericarditis. Discussed repeat thoracentesis vs chest tube with the patient. They are agreeable to chest tube placement.   Acute on chronic HFpEF - BNP elevated, recent Atrial fibrillation - diuresis as tolerated should help pleural and pericardial effusion  Atrial fibrillation - on amiodarone, herpain gtt, diltiazem   Pericarditis - cardiology following -Continue anti-inflammatories  History of Sjogren's syndrome rheumatoid arthritis - effusion not consistent with RA related pleural effusion, doubt this etiology - incidence of pleural effusion<1%, this is also less likely  Rest per primary team.   Durel Salts, MD Pulmonary and Critical Care Medicine [facility] 07/29/2023 3:21 PM Pager: see AMION  If no response to pager, please call critical care on call (see AMION) until 7pm After 7:00 pm call Elink

## 2023-07-30 ENCOUNTER — Inpatient Hospital Stay (HOSPITAL_COMMUNITY): Payer: Medicare PPO

## 2023-07-30 ENCOUNTER — Encounter (HOSPITAL_COMMUNITY)
Admission: EM | Disposition: A | Discharge status: Skilled Nursing Facility | Payer: Self-pay | Source: Home / Self Care | Attending: Internal Medicine

## 2023-07-30 DIAGNOSIS — R072 Precordial pain: Secondary | ICD-10-CM | POA: Diagnosis not present

## 2023-07-30 DIAGNOSIS — I480 Paroxysmal atrial fibrillation: Secondary | ICD-10-CM | POA: Diagnosis not present

## 2023-07-30 DIAGNOSIS — I300 Acute nonspecific idiopathic pericarditis: Secondary | ICD-10-CM | POA: Diagnosis not present

## 2023-07-30 DIAGNOSIS — J90 Pleural effusion, not elsewhere classified: Secondary | ICD-10-CM | POA: Diagnosis not present

## 2023-07-30 DIAGNOSIS — R079 Chest pain, unspecified: Secondary | ICD-10-CM | POA: Diagnosis not present

## 2023-07-30 DIAGNOSIS — Z4682 Encounter for fitting and adjustment of non-vascular catheter: Secondary | ICD-10-CM | POA: Diagnosis not present

## 2023-07-30 HISTORY — PX: RIGHT/LEFT HEART CATH AND CORONARY ANGIOGRAPHY: CATH118266

## 2023-07-30 LAB — POCT I-STAT EG7
Acid-base deficit: 1 mmol/L (ref 0.0–2.0)
Bicarbonate: 23.9 mmol/L (ref 20.0–28.0)
Calcium, Ion: 1.05 mmol/L — ABNORMAL LOW (ref 1.15–1.40)
HCT: 29 % — ABNORMAL LOW (ref 36.0–46.0)
Hemoglobin: 9.9 g/dL — ABNORMAL LOW (ref 12.0–15.0)
O2 Saturation: 65 %
Potassium: 3.1 mmol/L — ABNORMAL LOW (ref 3.5–5.1)
Sodium: 135 mmol/L (ref 135–145)
TCO2: 25 mmol/L (ref 22–32)
pCO2, Ven: 38 mmHg — ABNORMAL LOW (ref 44–60)
pH, Ven: 7.406 (ref 7.25–7.43)
pO2, Ven: 34 mmHg (ref 32–45)

## 2023-07-30 LAB — POCT I-STAT 7, (LYTES, BLD GAS, ICA,H+H)
Acid-Base Excess: 0 mmol/L (ref 0.0–2.0)
Bicarbonate: 23.3 mmol/L (ref 20.0–28.0)
Calcium, Ion: 1.11 mmol/L — ABNORMAL LOW (ref 1.15–1.40)
HCT: 29 % — ABNORMAL LOW (ref 36.0–46.0)
Hemoglobin: 9.9 g/dL — ABNORMAL LOW (ref 12.0–15.0)
O2 Saturation: 98 %
Potassium: 3.2 mmol/L — ABNORMAL LOW (ref 3.5–5.1)
Sodium: 133 mmol/L — ABNORMAL LOW (ref 135–145)
TCO2: 24 mmol/L (ref 22–32)
pCO2 arterial: 34.2 mmHg (ref 32–48)
pH, Arterial: 7.442 (ref 7.35–7.45)
pO2, Arterial: 102 mmHg (ref 83–108)

## 2023-07-30 LAB — POTASSIUM: Potassium: 3.3 mmol/L — ABNORMAL LOW (ref 3.5–5.1)

## 2023-07-30 SURGERY — RIGHT/LEFT HEART CATH AND CORONARY ANGIOGRAPHY
Anesthesia: LOCAL

## 2023-07-30 MED ORDER — POTASSIUM CHLORIDE 20 MEQ PO PACK
60.0000 meq | PACK | Freq: Once | ORAL | Status: AC
  Administered 2023-07-30: 60 meq via ORAL
  Filled 2023-07-30: qty 3

## 2023-07-30 MED ORDER — SODIUM CHLORIDE 0.9% FLUSH
3.0000 mL | Freq: Two times a day (BID) | INTRAVENOUS | Status: DC
  Administered 2023-07-31 – 2023-08-07 (×8): 3 mL via INTRAVENOUS

## 2023-07-30 MED ORDER — LIDOCAINE HCL (PF) 1 % IJ SOLN
INTRAMUSCULAR | Status: AC
  Filled 2023-07-30: qty 30

## 2023-07-30 MED ORDER — SODIUM CHLORIDE 0.9 % IV SOLN: INTRAVENOUS | Status: DC

## 2023-07-30 MED ORDER — LIDOCAINE HCL (PF) 1 % IJ SOLN
INTRAMUSCULAR | Status: DC | PRN
  Administered 2023-07-30: 4 mL

## 2023-07-30 MED ORDER — FUROSEMIDE 10 MG/ML IJ SOLN
40.0000 mg | Freq: Every day | INTRAMUSCULAR | Status: DC
  Administered 2023-07-30: 40 mg via INTRAVENOUS
  Filled 2023-07-30: qty 4

## 2023-07-30 MED ORDER — SODIUM CHLORIDE 0.9% FLUSH: 3.0000 mL | INTRAVENOUS | Status: DC | PRN

## 2023-07-30 MED ORDER — SODIUM CHLORIDE 0.9 % IV SOLN: 250.0000 mL | INTRAVENOUS | Status: DC | PRN

## 2023-07-30 MED ORDER — HEPARIN SODIUM (PORCINE) 1000 UNIT/ML IJ SOLN
INTRAMUSCULAR | Status: AC
  Filled 2023-07-30: qty 10

## 2023-07-30 MED ORDER — SODIUM CHLORIDE 0.9 % IV SOLN: INTRAVENOUS | Status: AC

## 2023-07-30 MED ORDER — HEPARIN (PORCINE) IN NACL 1000-0.9 UT/500ML-% IV SOLN
INTRAVENOUS | Status: DC | PRN
  Administered 2023-07-30 (×2): 500 mL

## 2023-07-30 MED ORDER — HEPARIN SODIUM (PORCINE) 1000 UNIT/ML IJ SOLN
INTRAMUSCULAR | Status: DC | PRN
  Administered 2023-07-30: 3000 [IU] via INTRAVENOUS

## 2023-07-30 MED ORDER — LABETALOL HCL 5 MG/ML IV SOLN: 10.0000 mg | INTRAVENOUS | Status: AC | PRN

## 2023-07-30 MED ORDER — VERAPAMIL HCL 2.5 MG/ML IV SOLN
INTRAVENOUS | Status: AC
  Filled 2023-07-30: qty 2

## 2023-07-30 MED ORDER — VERAPAMIL HCL 2.5 MG/ML IV SOLN
INTRAVENOUS | Status: DC | PRN
  Administered 2023-07-30: 10 mL via INTRA_ARTERIAL

## 2023-07-30 MED ORDER — HYDRALAZINE HCL 20 MG/ML IJ SOLN: 10.0000 mg | INTRAMUSCULAR | Status: AC | PRN

## 2023-07-30 MED ORDER — HYDRALAZINE HCL 20 MG/ML IJ SOLN
INTRAMUSCULAR | Status: AC
  Administered 2023-07-30: 10 mg via INTRAVENOUS
  Filled 2023-07-30: qty 1

## 2023-07-30 MED ORDER — IOHEXOL 350 MG/ML SOLN
INTRAVENOUS | Status: DC | PRN
  Administered 2023-07-30: 30 mL

## 2023-07-30 SURGICAL SUPPLY — 11 items
CATH 5FR JL3.5 JR4 ANG PIG MP (CATHETERS) ×1
CATH BALLN WEDGE 5F 110CM (CATHETERS) ×1
DEVICE RAD COMP TR BAND LRG (VASCULAR PRODUCTS) ×1
GLIDESHEATH SLEND SS 6F .021 (SHEATH) ×1
GUIDEWIRE .025 260CM (WIRE) ×1
INQWIRE 1.5J .035X260CM (WIRE) ×1
PACK CARDIAC CATHETERIZATION (CUSTOM PROCEDURE TRAY) ×1
SET ATX-X65L (MISCELLANEOUS) ×1
SHEATH GLIDE SLENDER 4/5FR (SHEATH) ×1
SHEATH PROBE COVER 6X72 (BAG) ×1
WIRE HI TORQ VERSACORE-J 145CM (WIRE) ×1

## 2023-07-30 NOTE — Progress Notes (Signed)
TR BAND REMOVAL  LOCATION:    radial  DEFLATED PER PROTOCOL:   yes  TIME BAND OFF / DRESSING APPLIED:    band off at 1448 and dressing/opsite applied  SITE UPON ARRIVAL:    Level 0  SITE AFTER BAND REMOVAL:    Level 0  CIRCULATION SENSATION AND MOVEMENT:    Within Normal Limits : yes  COMMENTS:   patient tolerated well

## 2023-07-30 NOTE — Progress Notes (Addendum)
PROGRESS NOTE                                                                                                                                                                                                             Patient Demographics:    Kristen Hansen, is a 77 y.o. female, DOB - Aug 25, 1946, VFI:[id number]  Outpatient Primary MD for the patient is Kerin Salen, PA-C    LOS - 5  Admit date - 07/25/2023    Chief Complaint  Patient presents with   Chest Pain       Brief Narrative (HPI from H&P)   Kristen Hansen is a 77 y.o. female with medical history significant of hypertension, hyperlipidemia, Lewy body dementia, Sjogren's syndrome, rheumatoid arthritis, chronic pain, DVT, spinal stenosis, diastolic CHF, depression, REM behavior disorder, orthostatic hypotension, CKD 3, GERD, hyperthyroidism presenting with chest pain. Her chest pain has atypical nature and present for 2 weeks intermittently. She was found to have a moderate pleural effusion. Cardiology was consulted and they are planning a catheterization given her cardiac history.     Subjective:   States doing well. Coughing has improved. She reports pain at site of chest tube.    Assessment  & Plan :   Assessment and Plan:  Chest Pain, exudative and recurrent right-sided pleural Effusion, Pericardial Effusion - Chest pain improved after thoracentesis. Given re-accumulation of fluid and exudative nature, PCCM was consulted. Appreciate their assistance. They placed a chest tube which had additional output of 1.4 L. Will monitor output of her effusion. Pt at risk for exudative effusion given her hx of autoimmune diseases. Per PCCM, her effusion might be a pseudo-exudative given diuretic use.  At discharge, will have her follow-up with pulmonary outpatient along with rheumatology.  Acute on chronic diastolic CHF EF 50 to 55%, and some preceding history of  exertional shortness of breath.   Pt had episode of worsening dyspnea on morning of 08/04 with coughing, CXR obtained showed volume overload. She was given IV lasix with improvement in her symptoms. Repeat dose of lasix 40 mg given this am. Cardiology planning cath today on 07/30/23.   Pericardial Effusion She has mild pericarditis and is currently on colchicine for pericardial effusion along with aspirin per cardiology.  Diuresis as tolerated by blood pressure and electrolytes (see above)  Paroxysmal Atrial Fibrillation:  Cardiology following. On amiodarone. Will continue heparin gtt. Can switch to DOAC after heart cath.  Dysphagia: OP work up ongoing. EGD without any acute findings. Getting swallowing therapy. SLP following pt. Repeat MBBS shows pt has some retention of contrast and some aspiration concern but overall unchanged from prior study and likely secondary to progressing lewy body dementia. Placed on Dys 3 Diet here. Will need follow up with palliative care OP (see below).   Lewy Body Dementia: On pyridostigmine and clonzapem, Will continue here. Last saw neurologist one month ago. Appears to be progressing and pt may benefit from palliative care consult (see below).   GOC: Pt with lewey body dementia and multiple co-morbidities and worsening dysphagia. She may benefit from OP palliative care consult. Will advise pt and her husband to discuss with PCP to initiate consult.   Orthostatic Hypotension: Pt with orthostatic hypotension. Suspect this is secondary to her lewy body dementia. Requested nurse to place ordered ted hose. Would encourage her to change positions slowly. Per husband, she has some dizziness at home.   Hyponatremia: Na 131, stable. Repeat urine studies ordered.   Hypomagnesemia/Hypokalemia: Replete as needed for goal K>4 and Mag>2. K 3.8 today so will replete today.  Hx of DVT: D dimer elevated,  CTA negative, lower extremity doppler which was negative for DVT.  Now on  heparin drip for A-fib.     Hyperthyroidism: Recently started on methimazole. States she is complaint with this. Will continue here. FT4 normal and FT3 slightly decreased at 1.7. Thyroid ultrasound ordered and shows bilateral nodules that do not meet criteria for biopsy for dedicated follow up.  Will need OP endocrinology follow up.    Left Shoulder Pain: Has good PROM but pain with active ROM. Concern for RT injury. 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 Conservative measures for now.  Overall better, outpatient orthopedics follow-up postdischarge. PCP to arrange.  HTN: BP 123/54 this am. Home meds: amlodipine 5 mg every day, irbesartan 300 mg every day, lopressor 25 mg BID. Holding amlodipine 5 mg and will hold irbesartan for now given diuresis.  GERD: Continue home med protonix. Does not endorse symptoms of reflux.   Sjogren's Syndrome: Not on any pharmacotherapy.  Likely causing pericardial effusion and exudative right-sided pleural effusion, currently on colchicine for pericardial effusion may require outpatient rheumatology follow-up postdischarge.  RA: Was on enbrel. Last used 2 months ago. Will need rheumatology follow up OP to restart this if needed.   Chronic back pain: Tylenol prn  MDD: Zoloft 50 mg every day. Continued here.   Trigeminal Neuralgia: On lamictal and trileptal. Both continued here.  Obesity: Estimated body mass index is 17.65 kg/m as calculated from the following:   Height as of this encounter: 5\' 10"  (1.778 m).   Weight as of this encounter: 55.8 kg.       Condition - Stable  Family Communication  : Husband updated at bedside 07/26/2023, 07/27/2023, 07/28/2023  Code Status :  Full  Consults  :  Cardiology, pulmonary  PUD Prophylaxis : PPI   Procedures  :     Right-sided thoracentesis showing exudative fluid done by IR on 07/26/2023.     Right Sided Chest Tube Placement by 07/30/23  Echocardiogram 1. Distal septal apical hypokinesis . Left ventricular ejection fraction, by estimation, is 50 to 55%. The left ventricle has low normal function. The left ventricle has  no regional wall motion abnormalities. There is mild left ventricular hypertrophy of the basal and septal segments. Left ventricular diastolic parameters are indeterminate.  2. Right ventricular systolic function is normal. The right ventricular size is normal.  3. A small pericardial effusion is present. The pericardial effusion is anterior to the right ventricle.  4. The mitral valve is degenerative. Mild mitral valve regurgitation. No evidence of mitral stenosis. Severe mitral annular calcification.  5. Tricuspid valve regurgitation is mild to moderate.  6. The aortic valve is tricuspid. There is moderate calcification of the aortic valve. There is moderate thickening of the aortic valve. Aortic valve regurgitation is trivial. Aortic valve sclerosis is present, with no evidence of aortic valve stenosis.  7. The inferior vena cava is normal in size with greater than 50% respiratory variability, suggesting right atrial pressure of 3 mmHg  MRI left shoulder - 1. Motion artifact technically limits this examination. 2. 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 3. Mild supraspinatus, infraspinatus, and superior subscapularis tendinosis. No definite rotator cuff tear. 4. Mild degenerative changes of the acromioclavicular joint. Type III acromion with mild downsloping of the anterolateral acromion. 5. Mild subacromial/subdeltoid bursitis. 6. Multiple loose bodies as above.  Lower extremity venous duplex bilateral.  No DVT.  LHC/RHC on 07/30/2023      Disposition Plan  :    Status is: Inpatient Remains inpatient appropriate because: needs continued medical work up including heart cath.   DVT Prophylaxis  :  Heparin gtt  Place TED hose Start: 07/27/23 1141   Lab  Results  Component Value Date   PLT 263 07/30/2023    Diet :  Diet Order             Diet NPO time specified Except for: Sips with Meds  Diet effective midnight                    Inpatient Medications  Scheduled Meds:  amiodarone  200 mg Oral BID   aspirin  81 mg Oral Daily   clonazePAM  1 mg Oral QHS   colchicine  0.6 mg Oral BID   furosemide  40 mg Intravenous Daily   irbesartan  75 mg Oral Daily   lamoTRIgine  150 mg Oral Daily   methIMAzole  10 mg Oral Daily   metoprolol tartrate  25 mg Oral BID   montelukast  10 mg Oral QHS   Oxcarbazepine  300 mg Oral BID   pantoprazole  40 mg Oral Daily   pravastatin  40 mg Oral QAC breakfast   pyridostigmine  60 mg Oral Q8H   sertraline  50 mg Oral Daily   sodium chloride flush  3 mL Intravenous Q12H   spironolactone  12.5 mg Oral Daily   Continuous Infusions:  sodium chloride     heparin 1,300 Units/hr (07/30/23 0017)   PRN Meds:.acetaminophen **OR** acetaminophen, diltiazem, loperamide, nitroGLYCERIN, polyethylene glycol, valACYclovir  Antibiotics  :    Anti-infectives (From admission, onward)    Start     Dose/Rate Route Frequency Ordered Stop   07/26/23 0540  valACYclovir (VALTREX) tablet 1,000 mg        1,000 mg Oral Every 12 hours PRN 07/26/23 0541           Objective:   Vitals:   07/29/23 1641 07/29/23 1725 07/29/23 2000 07/30/23 0000  BP: 135/61 (!) 130/52 120/74 (!) 126/56  Pulse: 60 (!) 54 (!) 57 (!) 59  Resp: 14 19 14 15   Temp:  98 F (36.7 C) 97.6 F (36.4 C)  TempSrc:   Oral Oral  SpO2: 97% 97% 96% 97%  Weight:      Height:        Wt Readings from Last 3 Encounters:  07/27/23 55.8 kg  06/11/23 61.7 kg  11/29/22 61.7 kg     Intake/Output Summary (Last 24 hours) at 07/30/2023 4098 Last data filed at 07/30/2023 0600 Gross per 24 hour  Intake --  Output 1410 ml  Net -1410 ml     Physical Exam General: NAD, chronically ill appearing HENT: NCAT Lungs: CTAB, no wheeze, rhonchi or  rales.  Cardiovascular: NSR. No LE edema Abdomen: No TTP, normal bowel sounds MSK: No asymmetry, has right sided chest tube. Slight TTP at area of chest tube.  Skin: no lesions noted on exposed skin Neuro: Alert and oriented x4. CN grossly intact Psych: Normal mood and normal affect    RN pressure injury documentation: None    Data Review:    Recent Labs  Lab 07/25/23 0308 07/26/23 0559 07/27/23 0146 07/28/23 0403 07/29/23 0327 07/30/23 0418  WBC 12.7* 9.4 10.0 8.9 8.1 9.8  HGB 10.6* 10.5* 10.0* 9.3* 10.0* 10.9*  HCT 33.1* 32.8* 30.4* 28.7* 31.3* 33.5*  PLT 301 283 287 282 306 263  MCV 82.8 83.5 84.0 82.5 83.2 83.3  MCH 26.5 26.7 27.6 26.7 26.6 27.1  MCHC 32.0 32.0 32.9 32.4 31.9 32.5  RDW 14.4 14.5 14.4 14.4 14.3 13.9  LYMPHSABS 0.7  --  1.5 1.2 1.7 1.5  MONOABS 0.9  --  0.9 0.9 0.9 1.1*  EOSABS 0.2  --  0.4 0.7* 0.7* 0.1  BASOSABS 0.0  --  0.0 0.0 0.1 0.0    Recent Labs  Lab 07/25/23 0308 07/25/23 0636 07/26/23 0559 07/26/23 0600 07/27/23 0146 07/28/23 0403 07/29/23 0327 07/29/23 0733 07/29/23 1001 07/30/23 0418  NA 134*  --  135  --  130* 131* 131*  --   --  131*  K 3.9  --  3.8  --  3.5 3.7 4.3  --   --  3.8  CL 102  --  100  --  98 101 102  --   --  94*  CO2 23  --  22  --  21* 21* 21*  --   --  24  ANIONGAP 9  --  13  --  11 9 8   --   --  13  GLUCOSE 126*  --  92  --  101* 104* 95  --   --  103*  BUN 21  --  17  --  21 20 16   --   --  16  CREATININE 1.04*  --  0.84  --  0.84 0.95 1.00  --   --  1.04*  AST 18  --  13*  --  11* 8* 18  --   --  24  ALT 13  --  13  --  12 12 14   --   --  17  ALKPHOS 141*  --  129*  --  104 108 121  --   --  143*  BILITOT 0.4  --  0.5  --  0.6 <0.1* 0.6  --   --  0.5  ALBUMIN 2.8*  --  2.3*  --  2.1* 1.9* 2.2*  --   --  2.4*  CRP  --   --   --  15.6*  --   --   --  7.7*  --  8.0*  DDIMER 3.49*  --   --   --   --   --   --   --   --   --   PROCALCITON  --   --   --  0.18  --   --   --   --  <0.10 0.10  INR  --  1.2  --    --   --   --   --   --   --   --   TSH  --   --   --  <0.010*  --   --   --   --   --   --   HGBA1C  --   --   --  5.2  --   --   --   --   --   --   BNP 733.2*  --   --  1,109.0* 928.2* 557.2* 574.4*  --   --  680.2*  MG  --   --   --  1.8 1.7 2.2 2.0  --   --  1.9  CALCIUM 8.8*  --  8.9  --  8.4* 8.1* 8.5*  --   --  8.9      Recent Labs  Lab 07/25/23 0308 07/25/23 0636 07/26/23 0559 07/26/23 0600 07/27/23 0146 07/28/23 0403 07/29/23 0327 07/29/23 0733 07/29/23 1001 07/30/23 0418  CRP  --   --   --  15.6*  --   --   --  7.7*  --  8.0*  DDIMER 3.49*  --   --   --   --   --   --   --   --   --   PROCALCITON  --   --   --  0.18  --   --   --   --  <0.10 0.10  INR  --  1.2  --   --   --   --   --   --   --   --   TSH  --   --   --  <0.010*  --   --   --   --   --   --   HGBA1C  --   --   --  5.2  --   --   --   --   --   --   BNP 733.2*  --   --  1,109.0* 928.2* 557.2* 574.4*  --   --  680.2*  MG  --   --   --  1.8 1.7 2.2 2.0  --   --  1.9  CALCIUM 8.8*  --  8.9  --  8.4* 8.1* 8.5*  --   --  8.9    Recent Labs  Lab 07/25/23 0308 07/26/23 0559 07/26/23 0600 07/27/23 0146 07/28/23 0403 07/29/23 0327 07/29/23 0733 07/29/23 1001 07/30/23 0418  WBC 12.7* 9.4  --  10.0 8.9 8.1  --   --  9.8  PLT 301 283  --  287 282 306  --   --  263  CRP  --   --  15.6*  --   --   --  7.7*  --  8.0*  DDIMER 3.49*  --   --   --   --   --   --   --   --   PROCALCITON  --   --  0.18  --   --   --   --  <  0.10 0.10  CREATININE 1.04* 0.84  --  0.84 0.95 1.00  --   --  1.04*    ------------------------------------------------------------------------------------------------------------------ Lab Results  Component Value Date   CHOL 144 07/26/2023   HDL 55 07/26/2023   LDLCALC 74 07/26/2023   TRIG 76 07/26/2023   CHOLHDL 2.6 07/26/2023    Lab Results  Component Value Date   HGBA1C 5.2 07/26/2023    No results for input(s): "TSH", "T4TOTAL", "T3FREE", "THYROIDAB" in the last 72  hours.  Invalid input(s): "FREET3"  ------------------------------------------------------------------------------------------------------------------ Cardiac Enzymes No results for input(s): "CKMB", "TROPONINI", "MYOGLOBIN" in the last 168 hours.  Invalid input(s): "CK"  Micro Results Recent Results (from the past 240 hour(s))  Culture, body fluid w Gram Stain-bottle     Status: None (Preliminary result)   Collection Time: 07/26/23 12:33 PM   Specimen: Pleura  Result Value Ref Range Status   Specimen Description PLEURAL  Final   Special Requests PLEURAL  Final   Culture   Final    NO GROWTH 3 DAYS Performed at [facility], [address], [place], [place] [address]    Report Status PENDING  Incomplete  Gram stain     Status: None   Collection Time: 07/26/23 12:33 PM   Specimen: Pleura  Result Value Ref Range Status   Specimen Description PLEURAL  Final   Special Requests NONE  Final   Gram Stain   Final    RARE WBC PRESENT,BOTH PMN AND MONONUCLEAR NO ORGANISMS SEEN Performed at [facility], [address], [place], [place] [address]    Report Status 07/26/2023 FINAL  Final  Respiratory (~20 pathogens) panel by PCR     Status: None   Collection Time: 07/27/23 12:28 PM   Specimen: Nasopharyngeal Swab; Respiratory  Result Value Ref Range Status   Adenovirus NOT DETECTED NOT DETECTED Final   Coronavirus 229E NOT DETECTED NOT DETECTED Final    Comment: (NOTE) The Coronavirus on the Respiratory Panel, DOES NOT test for the novel  Coronavirus (2019 nCoV)    Coronavirus HKU1 NOT DETECTED NOT DETECTED Final   Coronavirus NL63 NOT DETECTED NOT DETECTED Final   Coronavirus OC43 NOT DETECTED NOT DETECTED Final   Metapneumovirus NOT DETECTED NOT DETECTED Final   Rhinovirus / Enterovirus NOT DETECTED NOT DETECTED Final   Influenza A NOT DETECTED NOT DETECTED Final   Influenza B NOT DETECTED NOT DETECTED Final   Parainfluenza Virus 1 NOT DETECTED NOT DETECTED Final    Parainfluenza Virus 2 NOT DETECTED NOT DETECTED Final   Parainfluenza Virus 3 NOT DETECTED NOT DETECTED Final   Parainfluenza Virus 4 NOT DETECTED NOT DETECTED Final   Respiratory Syncytial Virus NOT DETECTED NOT DETECTED Final   Bordetella pertussis NOT DETECTED NOT DETECTED Final   Bordetella Parapertussis NOT DETECTED NOT DETECTED Final   Chlamydophila pneumoniae NOT DETECTED NOT DETECTED Final   Mycoplasma pneumoniae NOT DETECTED NOT DETECTED Final    Comment: Performed at [facility], [address] [place], [place] [address]    Radiology Reports DG Chest [place] 1 View  Result Date: 07/30/2023 CLINICAL DATA:  77 year old female with history of right-sided chest tube. EXAM: PORTABLE CHEST 1 VIEW COMPARISON:  Chest x-ray 07/29/2023. FINDINGS: Small bore right-sided chest tube stable in position with tip projecting over the medial aspect of the lower right hemithorax. No appreciable right-sided pneumothorax confidently identified at this time. Trace residual right pleural effusion. Ill-defined opacity at the right lung base which may reflect atelectasis and/or  consolidation. Left lung is clear. No left pleural effusion. No left pneumothorax. No pneumothorax no evidence of pulmonary edema. Heart size is normal. Upper mediastinal contours are within normal limits. Orthopedic fixation hardware in the lower cervical spine incidentally noted. IMPRESSION: 1. Stable position of right-sided chest tube with small residual right-sided pleural effusion. 2. Increasing atelectasis and/or consolidation in the right lung base. Electronically Signed   By: Trudie Reed M.D.   On: 07/30/2023 06:43   DG CHEST PORT 1 VIEW  Result Date: 07/29/2023 CLINICAL DATA:  Chest tube placement EXAM: PORTABLE CHEST 1 VIEW COMPARISON:  Earlier same day FINDINGS: Pigtail chest tube is been placed on the right, extending to the lower chest. Marked reduction in the amount of pleural density on the right. No visible  pleural air. Better pulmonary aeration. IMPRESSION: Chest tube placed on the right. Marked reduction in the amount of pleural density on the right. Electronically Signed   By: Paulina Fusi M.D.   On: 07/29/2023 18:10   DG Chest 2 View  Result Date: 07/29/2023 CLINICAL DATA:  Shortness of breath.  Cough.  Aspiration. EXAM: CHEST - 2 VIEW COMPARISON:  07/28/2023 FINDINGS: Left chest remains clear. Moderate to large pleural effusion on the right with dependent pulmonary atelectasis/infiltrate. Allowing for technical factors, the findings appear similar to the recent films. Chronic shoulder arthropathy on the left. IMPRESSION: Moderate to large right pleural effusion with dependent pulmonary atelectasis/infiltrate on the right. Electronically Signed   By: Paulina Fusi M.D.   On: 07/29/2023 10:51   DG CHEST PORT 1 VIEW  Result Date: 07/28/2023 CLINICAL DATA:  Follow-up pleural effusion EXAM: PORTABLE CHEST 1 VIEW COMPARISON:  07/27/2023 FINDINGS: Artifact overlies the chest. Left chest remains clear. Similar mount of pleural density on the right with volume loss in the right lower lung. No worsening or new finding. IMPRESSION: No worsening or new finding. Persistent pleural density on the right with volume loss. Electronically Signed   By: Paulina Fusi M.D.   On: 07/28/2023 09:54   US THYROID  Result Date: 07/27/2023 CLINICAL DATA:  Hyperthyroidism. EXAM: THYROID ULTRASOUND TECHNIQUE: Ultrasound examination of the thyroid gland and adjacent soft tissues was performed. COMPARISON:  None Available. FINDINGS: Parenchymal Echotexture: Mildly heterogenous Isthmus: 0.2 cm Right lobe: 5.1 x 2.0 x 1.7 cm Left lobe: 4.4 x 1.6 x 1.6 cm _________________________________________________________ Estimated total number of nodules >/= 1 cm: 2 Number of spongiform nodules >/=  2 cm not described below (TR1): 0 Number of mixed cystic and solid nodules >/= 1.5 cm not described below (TR2): 0  _________________________________________________________ Nodule #1 is a hyperechoic nodule in the right inferior thyroid lobe. This nodule measures up to 0.6 cm and does not meet criteria for biopsy or dedicated follow-up. Nodule # 2: Location: Left; Superior Maximum size: 1.0 cm; Other 2 dimensions: 0.4 x 0.7 cm Composition: solid/almost completely solid (2) Echogenicity: hyperechoic (1) Shape: not taller-than-wide (0) Margins: ill-defined (0) Echogenic foci: none (0) ACR TI-RADS total points: 3. ACR TI-RADS risk category: TR3 (3 points). ACR TI-RADS recommendations: Given size (<1.4 cm) and appearance, this nodule does NOT meet TI-RADS criteria for biopsy or dedicated follow-up. _________________________________________________________ Nodule # 3: Location: Left; Inferior Maximum size: 1.0 cm; Other 2 dimensions: 0.6 x 0.6 cm Composition: solid/almost completely solid (2) Echogenicity: hyperechoic (1) Shape: not taller-than-wide (0) Margins: ill-defined (0) Echogenic foci: none (0) ACR TI-RADS total points: 3. ACR TI-RADS risk category: TR3 (3 points). ACR TI-RADS recommendations: Given size (<1.4 cm) and appearance, this nodule  does NOT meet TI-RADS criteria for biopsy or dedicated follow-up. _________________________________________________________ Thyroid tissue is hypervascular. IMPRESSION: 1. Thyroid tissue is hypervascular and mildly heterogeneous. 2. Bilateral thyroid nodules that do not meet criteria for biopsy or dedicated follow-up. The above is in keeping with the ACR TI-RADS recommendations - J Am Coll Radiol 2017;14:587-595. Electronically Signed   By: Richarda Overlie M.D.   On: 07/27/2023 07:38   DG Chest Port 1 View  Result Date: 07/27/2023 CLINICAL DATA:  Shortness of breath.  Recent pleural effusion. EXAM: PORTABLE CHEST 1 VIEW COMPARISON:  07/26/23 FINDINGS: Normal heart size. Right pleural effusion is again noted. There is been interval worsening aeration to the right mid and right lower lung which  is favored to represent interval reaccumulation of right pleural effusion with progressive veil like opacification of the right lower lung. Cannot exclude superimposed airspace disease or atelectasis. Scar like opacities noted in the right upper lobe. IMPRESSION: Interval worsening aeration to the right mid and right lower lung which is favored to represent interval reaccumulation of right pleural effusion with progressive veil like opacification of the right lower lung. Cannot exclude superimposed airspace disease or atelectasis. Electronically Signed   By: Signa Kell M.D.   On: 07/27/2023 07:03   VAS [place] LOWER EXTREMITY VENOUS (DVT)  Result Date: 07/26/2023  Lower Venous DVT Study Patient Name:  Kristen Hansen  Date of Exam:   07/26/2023 Medical Rec #: [id number]      Accession #:    [id number] Date of Birth: Apr 25, 1946      Patient Gender: F Patient Age:   28 years Exam Location:  [facility] Procedure:      VAS [place] LOWER EXTREMITY VENOUS (DVT) Referring Phys: Bess Harvest [facility] --------------------------------------------------------------------------------  Indications: Swelling, and "rapidly rising D-Dimer".  Limitations: Body habitus and poor ultrasound/tissue interface. Comparison Study: No previous study. Performing Technologist: McKayla Maag RVT, [place]  Examination Guidelines: A complete evaluation includes B-mode imaging, spectral Doppler, color Doppler, and power Doppler as needed of all accessible portions of each vessel. Bilateral testing is considered an integral part of a complete examination. Limited examinations for reoccurring indications may be performed as noted. The reflux portion of the exam is performed with the patient in reverse Trendelenburg.  +---------+---------------+---------+-----------+----------+--------------+ RIGHT    CompressibilityPhasicitySpontaneityPropertiesThrombus Aging +---------+---------------+---------+-----------+----------+--------------+ CFV      Full            Yes      Yes                                 +---------+---------------+---------+-----------+----------+--------------+ SFJ      Full                                                        +---------+---------------+---------+-----------+----------+--------------+ FV Prox  Full                                                        +---------+---------------+---------+-----------+----------+--------------+ FV Mid   Full                                                        +---------+---------------+---------+-----------+----------+--------------+  FV DistalFull                                                        +---------+---------------+---------+-----------+----------+--------------+ PFV      Full                                                        +---------+---------------+---------+-----------+----------+--------------+ POP      Full           Yes      Yes                                 +---------+---------------+---------+-----------+----------+--------------+ PTV      Full                                                        +---------+---------------+---------+-----------+----------+--------------+ PERO     Full                                                        +---------+---------------+---------+-----------+----------+--------------+   +---------+---------------+---------+-----------+----------+--------------+ LEFT     CompressibilityPhasicitySpontaneityPropertiesThrombus Aging +---------+---------------+---------+-----------+----------+--------------+ CFV      Full           Yes      Yes                                 +---------+---------------+---------+-----------+----------+--------------+ SFJ      Full                                                        +---------+---------------+---------+-----------+----------+--------------+ FV Prox  Full                                                         +---------+---------------+---------+-----------+----------+--------------+ FV Mid   Full                                                        +---------+---------------+---------+-----------+----------+--------------+ FV DistalFull                                                        +---------+---------------+---------+-----------+----------+--------------+  PFV      Full                                                        +---------+---------------+---------+-----------+----------+--------------+ POP                                                   Not visualized +---------+---------------+---------+-----------+----------+--------------+ PTV      Full                                                        +---------+---------------+---------+-----------+----------+--------------+ PERO     Full                                         limited        +---------+---------------+---------+-----------+----------+--------------+     Summary: RIGHT: - There is no evidence of deep vein thrombosis in the lower extremity.  - No cystic structure found in the popliteal fossa.  LEFT: - There is no evidence of deep vein thrombosis in the lower extremity. However, portions of this examination were limited- see technologist comments above.  - No cystic structure found in the popliteal fossa.  *See table(s) above for measurements and observations. Electronically signed by Sherald Hess MD on 07/26/2023 at 5:37:26 PM.    Final    ECHOCARDIOGRAM COMPLETE  Result Date: 07/26/2023    ECHOCARDIOGRAM REPORT   Patient Name:   Kristen Hansen Date of Exam: 07/26/2023 Medical Rec #:  [id number]     Height:       70.0 in Accession #:    [id number]    Weight:       124.0 lb Date of Birth:  1946/11/23     BSA:          1.704 m Patient Age:    77 years      BP:           132/80 mmHg Patient Gender: F             HR:           73 bpm. Exam Location:  Inpatient Procedure: 2D Echo, Cardiac  Doppler and Color Doppler Indications:    CHF-Acute Diastolic I50.31  History:        Patient has no prior history of Echocardiogram examinations.                 CHF, Signs/Symptoms:Hypotension and Chest Pain; Risk                 Factors:Hypertension and Dyslipidemia. CKD, stage 3.  Sonographer:    Lucendia Herrlich Referring Phys: Effie Shy PRASHANT K [facility] IMPRESSIONS  1. Distal septal apical hypokinesis . Left ventricular ejection fraction, by estimation, is 50 to 55%. The left ventricle has low normal function. The left ventricle has no regional wall motion abnormalities. There is mild left ventricular hypertrophy of the basal and septal segments.  Left ventricular diastolic parameters are indeterminate.  2. Right ventricular systolic function is normal. The right ventricular size is normal.  3. A small pericardial effusion is present. The pericardial effusion is anterior to the right ventricle.  4. The mitral valve is degenerative. Mild mitral valve regurgitation. No evidence of mitral stenosis. Severe mitral annular calcification.  5. Tricuspid valve regurgitation is mild to moderate.  6. The aortic valve is tricuspid. There is moderate calcification of the aortic valve. There is moderate thickening of the aortic valve. Aortic valve regurgitation is trivial. Aortic valve sclerosis is present, with no evidence of aortic valve stenosis.  7. The inferior vena cava is normal in size with greater than 50% respiratory variability, suggesting right atrial pressure of 3 mmHg. FINDINGS  Left Ventricle: Distal septal apical hypokinesis. Left ventricular ejection fraction, by estimation, is 50 to 55%. The left ventricle has low normal function. The left ventricle has no regional wall motion abnormalities. The left ventricular internal cavity size was normal in size. There is mild left ventricular hypertrophy of the basal and septal segments. Left ventricular diastolic parameters are indeterminate. Right Ventricle: The right  ventricular size is normal. No increase in right ventricular wall thickness. Right ventricular systolic function is normal. Left Atrium: Left atrial size was normal in size. Right Atrium: Right atrial size was normal in size. Pericardium: A small pericardial effusion is present. The pericardial effusion is anterior to the right ventricle. Mitral Valve: The mitral valve is degenerative in appearance. There is moderate thickening of the mitral valve leaflet(s). There is moderate calcification of the mitral valve leaflet(s). Severe mitral annular calcification. Mild mitral valve regurgitation. No evidence of mitral valve stenosis. MV peak gradient, 10.4 mmHg. The mean mitral valve gradient is 3.0 mmHg. Tricuspid Valve: The tricuspid valve is normal in structure. Tricuspid valve regurgitation is mild to moderate. No evidence of tricuspid stenosis. Aortic Valve: The aortic valve is tricuspid. There is moderate calcification of the aortic valve. There is moderate thickening of the aortic valve. Aortic valve regurgitation is trivial. Aortic valve sclerosis is present, with no evidence of aortic valve  stenosis. Aortic valve peak gradient measures 4.8 mmHg. Pulmonic Valve: The pulmonic valve was normal in structure. Pulmonic valve regurgitation is mild. No evidence of pulmonic stenosis. Aorta: The aortic root is normal in size and structure. Venous: The inferior vena cava is normal in size with greater than 50% respiratory variability, suggesting right atrial pressure of 3 mmHg. IAS/Shunts: No atrial level shunt detected by color flow Doppler.  LEFT VENTRICLE PLAX 2D LVIDd:         3.10 cm   Diastology LVIDs:         1.90 cm   LV e' medial:    4.51 cm/s LV PW:         1.30 cm   LV E/e' medial:  26.2 LV IVS:        1.00 cm   LV e' lateral:   4.32 cm/s LVOT diam:     1.90 cm   LV E/e' lateral: 27.3 LV SV:         48 LV SV Index:   28 LVOT Area:     2.84 cm  RIGHT VENTRICLE             IVC RV S prime:     18.40 cm/s  IVC  diam: 1.60 cm TAPSE (M-mode): 2.0 cm LEFT ATRIUM             Index  RIGHT ATRIUM           Index LA diam:        3.60 cm 2.11 cm/m   RA Area:     12.90 cm LA Vol (A2C):   64.7 ml 37.98 ml/m  RA Volume:   29.60 ml  17.38 ml/m LA Vol (A4C):   67.5 ml 39.62 ml/m LA Biplane Vol: 73.2 ml 42.97 ml/m  AORTIC VALVE AV Area (Vmax): 2.36 cm AV Vmax:        109.00 cm/s AV Peak Grad:   4.8 mmHg LVOT Vmax:      90.80 cm/s LVOT Vmean:     58.367 cm/s LVOT VTI:       0.171 m  AORTA Ao Root diam: 3.30 cm Ao Asc diam:  2.80 cm MITRAL VALVE                TRICUSPID VALVE MV Area (PHT): 3.85 cm     TR Peak grad:   43.6 mmHg MV Area VTI:   1.12 cm     TR Vmax:        330.00 cm/s MV Peak grad:  10.4 mmHg MV Mean grad:  3.0 mmHg     SHUNTS MV Vmax:       1.61 m/s     Systemic VTI:  0.17 m MV Vmean:      77.2 cm/s    Systemic Diam: 1.90 cm MV Decel Time: 197 msec MV E velocity: 118.00 cm/s MV A velocity: 107.00 cm/s MV E/A ratio:  1.10 Charlton Haws MD Electronically signed by Charlton Haws MD Signature Date/Time: 07/26/2023/2:19:09 PM    Final    MR SHOULDER LEFT WO CONTRAST  Result Date: 07/26/2023 CLINICAL DATA:  Chronic left shoulder pain. Rotator cuff disorder suspected. EXAM: MRI OF THE LEFT SHOULDER WITHOUT CONTRAST TECHNIQUE: Multiplanar, multisequence MR imaging of the shoulder was performed. No intravenous contrast was administered. COMPARISON:  None Available. FINDINGS: Despite efforts by the technologist and patient, motion artifact is present on today's exam and could not be eliminated. This reduces exam sensitivity and specificity. Rotator cuff: Within the limitations of patient motion artifact, there appears to be mild supraspinatus and infraspinatus tendinosis. No definite tendon tear. There is a focus of decreased T1 increased T2 signal cystic change versus focal marrow edema within the humeral head just deep to the slightly anterior aspect of the supraspinatus tendon footprint measuring up to 12 x 9 mm (AP  by craniocaudal). Mild superior subscapularis intermediate T2 signal tendinosis. The teres minor is intact. Muscles:  Moderate supraspinatus muscle atrophy. Biceps long head: The intra-articular long head of the biceps tendon is intact. Acromioclavicular Joint: There are mild degenerative changes of the acromioclavicular joint including joint space narrowing and peripheral osteophytosis. Type III acromion with mild downsloping of the anterolateral acromion. Mild fluid within the subacromial/subdeltoid bursa. Glenohumeral Joint: Moderate to severe glenohumeral cartilage thinning. Moderate inferior humeral head-neck junction degenerative osteophytosis. Mild glenoid fossa and medial humeral head cortical flattening/remodeling. Labrum: Mild-to-moderate attenuation and degenerative irregularity of the posterior glenoid labrum. Bones:  No acute fracture. Other: Mild joint fluid extends into the subcoracoid recess in this region there is a 15 x 12 x 10 mm low signal loose body (sagittal series 6, image 18, coronal series 4, image 14, and axial series 2, image 11). Within the inferior axillary pouch there is marrow fat intensity likely loose body measuring up to 2.2 cm (sagittal series 7, image 12). IMPRESSION: 1. Motion artifact technically limits this  examination. 2. 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 3. Mild supraspinatus, infraspinatus, and superior subscapularis tendinosis. No definite rotator cuff tear. 4. Mild degenerative changes of the acromioclavicular joint. Type III acromion with mild downsloping of the anterolateral acromion. 5. Mild subacromial/subdeltoid bursitis. 6. Multiple loose bodies as above. Electronically Signed   By: Neita Garnet M.D.   On: 07/26/2023 13:58   IR THORACENTESIS ASP PLEURAL SPACE W/IMG GUIDE  Result Date: 07/26/2023 INDICATION: Patient with a history of heart failure presents today with new onset right pleural effusion. Interventional Radiology asked to perform a  diagnostic and therapeutic thoracentesis. EXAM: ULTRASOUND GUIDED THORACENTESIS MEDICATIONS: 1% lidocaine 10 ml COMPLICATIONS: None immediate. PROCEDURE: An ultrasound guided thoracentesis was thoroughly discussed with the patient and questions answered. The benefits, risks, alternatives and complications were also discussed. The patient understands and wishes to proceed with the procedure. Written consent was obtained. Ultrasound was performed to localize and mark an adequate pocket of fluid in the right chest. The area was then prepped and draped in the normal sterile fashion. 1% Lidocaine was used for local anesthesia. Under ultrasound guidance a 6 Fr Safe-T-Centesis catheter was introduced. Thoracentesis was performed. The catheter was removed and a dressing applied. FINDINGS: A total of approximately 1.3 L of clear yellow fluid was removed. Samples were sent to the laboratory as requested by the clinical team. IMPRESSION: Successful ultrasound guided right thoracentesis yielding 1.3 L of pleural fluid. Procedure performed by: Alwyn Ren, NP and supervised by Dr. Fredia Sorrow Electronically Signed   By: Irish Lack M.D.   On: 07/26/2023 13:48   DG Chest 1 View  Result Date: 07/26/2023 CLINICAL DATA:  Status post thoracentesis EXAM: CHEST  1 VIEW COMPARISON:  07/25/2023 FINDINGS: Diminished fluid volume of a loculated lateral and basal right hydropneumothorax, with moderate, layering persistent fluid volume and associated pleural thickening. Left lung is normally aerated. Heart and mediastinum are normal. IMPRESSION: Diminished fluid volume of a loculated lateral and basal right hydropneumothorax, with moderate, layering persistent fluid volume and associated pleural thickening. Electronically Signed   By: Jearld Lesch M.D.   On: 07/26/2023 13:10      Signature  -   Gwenevere Abbot M.D on 07/30/2023 at 6:52 AM   -  To Chasen go to [email or web address]

## 2023-07-30 NOTE — Progress Notes (Signed)
OT Cancellation Note  Patient Details Name: Kristen Hansen MRN: [id number] DOB: May 28, 1946   Cancelled Treatment:    Reason Eval/Treat Not Completed: Patient at procedure or test/ unavailable (Patient off unit for procedure, OT to attempt as schedule permits.) Alfonse Flavors, OTA Acute Rehabilitation Services  Office [phone]  Dewain Penning 07/30/2023, 2:15 PM

## 2023-07-30 NOTE — Progress Notes (Signed)
PT Cancellation Note  Patient Details Name: Kristen Hansen MRN: [id number] DOB: 1946-12-11   Cancelled Treatment:    Reason Eval/Treat Not Completed: Patient at procedure or test/unavailable   Jerolyn Center, PT Acute Rehabilitation Services  Office [phone]  [name] 07/30/2023, 1:18 PM

## 2023-07-30 NOTE — Progress Notes (Signed)
SLP Cancellation Note  Patient Details Name: Kristen Hansen MRN: [id number] DOB: [birth year]   Cancelled treatment:        Pt in procedure. Will continue efforts.    Royce Macadamia 07/30/2023, 12:59 PM

## 2023-07-30 NOTE — Progress Notes (Signed)
NAME:  Kristen Hansen, MRN:  [id number], DOB:  09/18/1946, LOS: 5 ADMISSION DATE:  07/25/2023, CONSULTATION DATE: 07/27/2023 REFERRING MD: Dr. Thedore Mins, CHIEF COMPLAINT: Pleural effusion  History of Present Illness:  Asked to see for recurrent pleural effusion Recently had a thoracenteses for 1.1 L of exudative fluid  Denies any significant complaints at present  Presented to the hospital with chest pain, shortness of breath with activity  Pertinent  Medical History   Past Medical History:  Diagnosis Date   Back pain    Cancer (HCC)    Skin, basal cell   Dyspnea    Fatigue    GERD (gastroesophageal reflux disease)    Headache(784.0)    history of migraines   History of kidney stones    Hypercholesteremia    Hypertension    Leg swelling    Bilateral   Occasional tremors    mild hand tremors   Osteoarthritis    PONV (postoperative nausea and vomiting)    RBD (REM behavioral disorder)    Rheumatoid arthritis(714.0)    Scoliosis of lumbar spine    Spinal stenosis    Multiple levels   Tic [place]    Trigeminal neuralgia 03/30/2015   Vision abnormalities    Significant Hospital Events: Including procedures, antibiotic start and stop dates in addition to other pertinent events   8/1 thoracentesis 1.3 L 8/4 chest tube placement> 1.35L drained that shift  Interim History / Subjective:  R&L heart cath today. She has some pain on her left side but denies chest tube related pain.  Objective   Blood pressure (!) 123/54, pulse 62, temperature 98.2 F (36.8 C), temperature source Oral, resp. rate 19, height 5\' 10"  (1.778 m), weight 55.8 kg, SpO2 98%.        Intake/Output Summary (Last 24 hours) at 07/30/2023 1218 Last data filed at 07/30/2023 0600 Gross per 24 hour  Intake --  Output 1410 ml  Net -1410 ml   Filed Weights   07/25/23 0251 07/27/23 0110  Weight: 56.2 kg 55.8 kg    Examination: Chronically ill, frail appearing woman lying in bed in NAD Breathing comfortably  on RA, CTAB.  Chest tube with sanguinous fluid- tidaling after tube flushed. Both side flush easily.  S1S2, RRR. Systolic murmur over left precordium. Abdomen: soft, NT Extremities: minimal muscle mass Neuro: awake, alert, answering some questions.   Na+  131 BUN 16 Cr 1.04 BNP 680 WBC 9.8 H/H 10.9/33.5 CXR personally reviewed> resolved effusion, chest tube remains in appropriate position  Pleural fluid: no repeat studies sent on 8/4 Path from 8/1: no malignant cells  Resolved Hospital Problem list     Assessment & Plan:  Right sided pleural effusion, weakly exudative - differential diagnosis serositis due to pleuropericarditis, aspiration related reactive effusion. Heart failure with diuresis is most likely with such low cellularity. Normal glucose suggests not RA-related.  - cytology negative for malignancy - con't chest tube to suction; once <200cc output over 24 hrs will consider removal -waiting on results of heart cath -AM CXR  Acute on chronic HFpEF, new RWMA on echo -diuresis per cardiology  Atrial fibrillation, new onset -management per Cardiology  Pericarditis- autoimmune? -Appreciate Cardiology's management -Continue colchicine  History of Sjogren's syndrome & rheumatoid arthritis -on Etanercept PTA   Steffanie Dunn, DO 07/30/23 12:18 PM [facility]  For contact information, see Amion. If no response to pager, please call PCCM consult pager. After hours, 7PM- 7AM, please call Elink.

## 2023-07-30 NOTE — Progress Notes (Signed)
ANTICOAGULATION CONSULT NOTE  Pharmacy Consult for Heparin Indication: atrial fibrillation  Allergies  Allergen Reactions   Morphine And Codeine Hives   Prevacid [Lansoprazole] Other (See Comments)    unknown   Provigil [Modafinil] Other (See Comments)    dizziness   Adhesive [Tape] Rash    bandaids     Patient Measurements: Height: 5\' 10"  (177.8 cm) Weight: 55.8 kg (123 lb 0.3 oz) IBW/kg (Calculated) : 68.5 Heparin Dosing Weight: 56 kg  Vital Signs: Temp: 97.6 F (36.4 C) (08/05 0000) Temp Source: Oral (08/05 0000) BP: 126/56 (08/05 0000) Pulse Rate: 59 (08/05 0000)  Labs: Recent Labs    07/27/23 0852 07/27/23 1758 07/28/23 0403 07/28/23 1216 07/28/23 2220 07/29/23 0327 07/30/23 0418  HGB  --    < > 9.3*  --   --  10.0* 10.9*  HCT  --   --  28.7*  --   --  31.3* 33.5*  PLT  --   --  282  --   --  306 263  HEPARINUNFRC  --    < > 0.16*   < > 0.42 0.51 0.49  CREATININE  --   --  0.95  --   --  1.00 1.04*  TROPONINIHS 26*  --   --   --   --   --   --    < > = values in this interval not displayed.    Estimated Creatinine Clearance: 40.5 mL/min (A) (by C-G formula based on SCr of 1.04 mg/dL (H)).  Assessment: Patient is a 49 yof admitted for chest pain and new onset afib. Patient found to have pericardial effusion/pericarditis. She had thoracentesis 8/1 with 1.3L removed. Pharmacy consulted for heparin infusion for afib.  Heparin level therapeutic; CBC stable; no bleeding reported.  Goal of Therapy:  Heparin level 0.3-0.7 units/ml Monitor platelets by anticoagulation protocol: Yes   Plan:   Continue heparin drip at 1300 units/hr Daily heparin level and CBC   D. Laney Potash, PharmD, BCPS, BCCCP 07/30/2023, 8:08 AM

## 2023-07-30 NOTE — Interval H&P Note (Signed)
History and Physical Interval Note:  07/30/2023 11:50 AM  Kristen Hansen  has presented today for surgery, with the diagnosis of mitral stenosis.  The various methods of treatment have been discussed with the patient and family. After consideration of risks, benefits and other options for treatment, the patient has consented to  Procedure(s): RIGHT/LEFT HEART CATH AND CORONARY ANGIOGRAPHY (N/A) as a surgical intervention.  The patient's history has been reviewed, patient examined, no change in status, stable for surgery.  I have reviewed the patient's chart and labs.  Questions were answered to the patient's satisfaction.    Cath Lab Visit (complete for each Cath Lab visit)  Clinical Evaluation Leading to the Procedure:   ACS: Yes.    Non-ACS:    Anginal Classification: CCS III  Anti-ischemic medical therapy: Minimal Therapy (1 class of medications)  Non-Invasive Test Results: No non-invasive testing performed  Prior CABG: No previous CABG        Kristen Hansen

## 2023-07-30 NOTE — H&P (View-Only) (Signed)
Rounding Note    Patient Name: Kristen Hansen Date of Encounter: 07/30/2023  [facility] Cardiologist: None   Subjective   Had some chest pain with cough. No significant SOB.   Inpatient Medications    Scheduled Meds:  amiodarone  200 mg Oral BID   aspirin  81 mg Oral Daily   clonazePAM  1 mg Oral QHS   colchicine  0.6 mg Oral BID   furosemide  40 mg Intravenous Daily   irbesartan  75 mg Oral Daily   lamoTRIgine  150 mg Oral Daily   methIMAzole  10 mg Oral Daily   metoprolol tartrate  25 mg Oral BID   montelukast  10 mg Oral QHS   Oxcarbazepine  300 mg Oral BID   pantoprazole  40 mg Oral Daily   pravastatin  40 mg Oral QAC breakfast   pyridostigmine  60 mg Oral Q8H   sertraline  50 mg Oral Daily   sodium chloride flush  3 mL Intravenous Q12H   spironolactone  12.5 mg Oral Daily   Continuous Infusions:  sodium chloride     heparin 1,300 Units/hr (07/30/23 0017)   PRN Meds: acetaminophen **OR** acetaminophen, diltiazem, loperamide, nitroGLYCERIN, polyethylene glycol, valACYclovir   Vital Signs    Vitals:   07/29/23 1725 07/29/23 2000 07/30/23 0000 07/30/23 0850  BP: (!) 130/52 120/74 (!) 126/56 (!) 123/54  Pulse: (!) 54 (!) 57 (!) 59 62  Resp: 19 14 15 19   Temp:  98 F (36.7 C) 97.6 F (36.4 C) 98.2 F (36.8 C)  TempSrc:  Oral Oral Oral  SpO2: 97% 96% 97%   Weight:      Height:        Intake/Output Summary (Last 24 hours) at 07/30/2023 1029 Last data filed at 07/30/2023 0600 Gross per 24 hour  Intake --  Output 1410 ml  Net -1410 ml      07/27/2023    1:10 AM 07/25/2023    2:51 AM 06/11/2023    5:35 PM  Last 3 Weights  Weight (lbs) 123 lb 0.3 oz 124 lb 136 lb  Weight (kg) 55.8 kg 56.246 kg 61.689 kg      Telemetry    NSR overnight, no recurrent afib - Personally Reviewed  ECG    Atrial fibrillation, rate controlled - Personally Reviewed  Physical Exam   GEN: No acute distress.   Neck: No JVD Cardiac: RRR, no murmurs, rubs, or  gallops.  Respiratory: Clear to auscultation bilaterally. GI: Soft, nontender, non-distended  MS: No edema; No deformity. Neuro:  Nonfocal  Psych: Normal affect   Labs    High Sensitivity Troponin:   Recent Labs  Lab 07/25/23 0443 07/25/23 0958 07/25/23 1533 07/25/23 1745 07/27/23 0852  TROPONINIHS 34* 38* 42* 41* 26*     Chemistry Recent Labs  Lab 07/28/23 0403 07/29/23 0327 07/30/23 0418  NA 131* 131* 131*  K 3.7 4.3 3.8  CL 101 102 94*  CO2 21* 21* 24  GLUCOSE 104* 95 103*  BUN 20 16 16   CREATININE 0.95 1.00 1.04*  CALCIUM 8.1* 8.5* 8.9  MG 2.2 2.0 1.9  PROT 5.0* 5.6* 6.0*  ALBUMIN 1.9* 2.2* 2.4*  AST 8* 18 24  ALT 12 14 17   ALKPHOS 108 121 143*  BILITOT <0.1* 0.6 0.5  GFRNONAA >60 58* 56*  ANIONGAP 9 8 13     Lipids  Recent Labs  Lab 07/26/23 0600  CHOL 144  TRIG 76  HDL 55  LDLCALC  74  CHOLHDL 2.6    Hematology Recent Labs  Lab 07/28/23 0403 07/29/23 0327 07/30/23 0418  WBC 8.9 8.1 9.8  RBC 3.48* 3.76* 4.02  HGB 9.3* 10.0* 10.9*  HCT 28.7* 31.3* 33.5*  MCV 82.5 83.2 83.3  MCH 26.7 26.6 27.1  MCHC 32.4 31.9 32.5  RDW 14.4 14.3 13.9  PLT 282 306 263   Thyroid  Recent Labs  Lab 07/26/23 0559 07/26/23 0600  TSH  --  <0.010*  FREET4 0.98  --     BNP Recent Labs  Lab 07/28/23 0403 07/29/23 0327 07/30/23 0418  BNP 557.2* 574.4* 680.2*    DDimer  Recent Labs  Lab 07/25/23 0308  DDIMER 3.49*     Radiology    DG Chest Port 1 View  Result Date: 07/30/2023 CLINICAL DATA:  77 year old female with history of right-sided chest tube. EXAM: PORTABLE CHEST 1 VIEW COMPARISON:  Chest x-ray 07/29/2023. FINDINGS: Small bore right-sided chest tube stable in position with tip projecting over the medial aspect of the lower right hemithorax. No appreciable right-sided pneumothorax confidently identified at this time. Trace residual right pleural effusion. Ill-defined opacity at the right lung base which may reflect atelectasis and/or  consolidation. Left lung is clear. No left pleural effusion. No left pneumothorax. No pneumothorax no evidence of pulmonary edema. Heart size is normal. Upper mediastinal contours are within normal limits. Orthopedic fixation hardware in the lower cervical spine incidentally noted. IMPRESSION: 1. Stable position of right-sided chest tube with small residual right-sided pleural effusion. 2. Increasing atelectasis and/or consolidation in the right lung base. Electronically Signed   By: Trudie Reed M.D.   On: 07/30/2023 06:43   DG CHEST PORT 1 VIEW  Result Date: 07/29/2023 CLINICAL DATA:  Chest tube placement EXAM: PORTABLE CHEST 1 VIEW COMPARISON:  Earlier same day FINDINGS: Pigtail chest tube is been placed on the right, extending to the lower chest. Marked reduction in the amount of pleural density on the right. No visible pleural air. Better pulmonary aeration. IMPRESSION: Chest tube placed on the right. Marked reduction in the amount of pleural density on the right. Electronically Signed   By: Paulina Fusi M.D.   On: 07/29/2023 18:10   DG Chest 2 View  Result Date: 07/29/2023 CLINICAL DATA:  Shortness of breath.  Cough.  Aspiration. EXAM: CHEST - 2 VIEW COMPARISON:  07/28/2023 FINDINGS: Left chest remains clear. Moderate to large pleural effusion on the right with dependent pulmonary atelectasis/infiltrate. Allowing for technical factors, the findings appear similar to the recent films. Chronic shoulder arthropathy on the left. IMPRESSION: Moderate to large right pleural effusion with dependent pulmonary atelectasis/infiltrate on the right. Electronically Signed   By: Paulina Fusi M.D.   On: 07/29/2023 10:51    Cardiac Studies   Echo 07/26/2023  1. Distal septal apical hypokinesis . Left ventricular ejection fraction,  by estimation, is 50 to 55%. The left ventricle has low normal function.  The left ventricle has no regional wall motion abnormalities. There is  mild left ventricular hypertrophy   of the basal and septal segments. Left ventricular diastolic parameters  are indeterminate.   2. Right ventricular systolic function is normal. The right ventricular  size is normal.   3. A small pericardial effusion is present. The pericardial effusion is  anterior to the right ventricle.   4. The mitral valve is degenerative. Mild mitral valve regurgitation. No  evidence of mitral stenosis. Severe mitral annular calcification.   5. Tricuspid valve regurgitation is mild to moderate.  6. The aortic valve is tricuspid. There is moderate calcification of the  aortic valve. There is moderate thickening of the aortic valve. Aortic  valve regurgitation is trivial. Aortic valve sclerosis is present, with no  evidence of aortic valve stenosis.   7. The inferior vena cava is normal in size with greater than 50%  respiratory variability, suggesting right atrial pressure of 3 mmHg.    Patient Profile     77 y.o. female with PMH of RA, lewy-body dementia, prior DVT, Sjogren's syndrome, chronic diastolic CHF, mitral stenosis and hypertension who presented with chest pain  Assessment & Plan    Chest pain  - abnormal myoview in Jan 2024, cardiac cath discussed at the time, but patient preferred medical management.   - presented on 7/31 with intermittent chest pain. BNP elevated at 1109. Serial borderline borderline elevated but flat. CTA negative for PE, but shows a large R pleural effusion  - initial clinical picture concerning for pericarditis. CRP elevated. Given colchicine.   - TTE showed small pericardial effusion with RWMA in septum and apex  - plans for left and right heart cath today  - Risk and benefit of procedure explained to the patient who display clear understanding and agree to proceed.  Discussed with patient possible procedural risk include bleeding, vascular injury, renal injury, arrythmia, MI, stroke and loss of limb or life.   Pericarditis:  on colchicine  New onset  atrial fibrillation: mainitaining NSR on amiodarone. On IV heparin, will need DOAC once chest tube is out.   R pleural effusion: s/p thoracentesis with removal of 1.3 L of fluid, s/p chest tube, had 1.4 L additional output overnight  Chronic diastolic CHF: receiving IV diuretic.   Moderate MS: previous echo obtained at [facility] showed moderate MS and mild MR  Hypertension      For questions or updates, please contact [facility] Please consult [email or web address] for contact info under        Signed, Azalee Course, PA  07/30/2023, 10:29 AM     Patient seen and examined.  Agree with above documentation.  On exam, patient is alert, regular rate and rhythm, no murmurs, diminished breath sounds on right side, no LE edema or JVD.  Chest pain improved.  Planning LHC today given positive stress test earlier this year.  She reports dyspnea improved since chest tube placed yesterday.  Little Ishikawa, MD

## 2023-07-30 NOTE — Progress Notes (Addendum)
Rounding Note    Patient Name: Kristen Hansen Date of Encounter: 07/30/2023  [facility] Cardiologist: None   Subjective   Had some chest pain with cough. No significant SOB.   Inpatient Medications    Scheduled Meds:  amiodarone  200 mg Oral BID   aspirin  81 mg Oral Daily   clonazePAM  1 mg Oral QHS   colchicine  0.6 mg Oral BID   furosemide  40 mg Intravenous Daily   irbesartan  75 mg Oral Daily   lamoTRIgine  150 mg Oral Daily   methIMAzole  10 mg Oral Daily   metoprolol tartrate  25 mg Oral BID   montelukast  10 mg Oral QHS   Oxcarbazepine  300 mg Oral BID   pantoprazole  40 mg Oral Daily   pravastatin  40 mg Oral QAC breakfast   pyridostigmine  60 mg Oral Q8H   sertraline  50 mg Oral Daily   sodium chloride flush  3 mL Intravenous Q12H   spironolactone  12.5 mg Oral Daily   Continuous Infusions:  sodium chloride     heparin 1,300 Units/hr (07/30/23 0017)   PRN Meds: acetaminophen **OR** acetaminophen, diltiazem, loperamide, nitroGLYCERIN, polyethylene glycol, valACYclovir   Vital Signs    Vitals:   07/29/23 1725 07/29/23 2000 07/30/23 0000 07/30/23 0850  BP: (!) 130/52 120/74 (!) 126/56 (!) 123/54  Pulse: (!) 54 (!) 57 (!) 59 62  Resp: 19 14 15 19   Temp:  98 F (36.7 C) 97.6 F (36.4 C) 98.2 F (36.8 C)  TempSrc:  Oral Oral Oral  SpO2: 97% 96% 97%   Weight:      Height:        Intake/Output Summary (Last 24 hours) at 07/30/2023 1029 Last data filed at 07/30/2023 0600 Gross per 24 hour  Intake --  Output 1410 ml  Net -1410 ml      07/27/2023    1:10 AM 07/25/2023    2:51 AM 06/11/2023    5:35 PM  Last 3 Weights  Weight (lbs) 123 lb 0.3 oz 124 lb 136 lb  Weight (kg) 55.8 kg 56.246 kg 61.689 kg      Telemetry    NSR overnight, no recurrent afib - Personally Reviewed  ECG    Atrial fibrillation, rate controlled - Personally Reviewed  Physical Exam   GEN: No acute distress.   Neck: No JVD Cardiac: RRR, no murmurs, rubs, or  gallops.  Respiratory: Clear to auscultation bilaterally. GI: Soft, nontender, non-distended  MS: No edema; No deformity. Neuro:  Nonfocal  Psych: Normal affect   Labs    High Sensitivity Troponin:   Recent Labs  Lab 07/25/23 0443 07/25/23 0958 07/25/23 1533 07/25/23 1745 07/27/23 0852  TROPONINIHS 34* 38* 42* 41* 26*     Chemistry Recent Labs  Lab 07/28/23 0403 07/29/23 0327 07/30/23 0418  NA 131* 131* 131*  K 3.7 4.3 3.8  CL 101 102 94*  CO2 21* 21* 24  GLUCOSE 104* 95 103*  BUN 20 16 16   CREATININE 0.95 1.00 1.04*  CALCIUM 8.1* 8.5* 8.9  MG 2.2 2.0 1.9  PROT 5.0* 5.6* 6.0*  ALBUMIN 1.9* 2.2* 2.4*  AST 8* 18 24  ALT 12 14 17   ALKPHOS 108 121 143*  BILITOT <0.1* 0.6 0.5  GFRNONAA >60 58* 56*  ANIONGAP 9 8 13     Lipids  Recent Labs  Lab 07/26/23 0600  CHOL 144  TRIG 76  HDL 55  LDLCALC  74  CHOLHDL 2.6    Hematology Recent Labs  Lab 07/28/23 0403 07/29/23 0327 07/30/23 0418  WBC 8.9 8.1 9.8  RBC 3.48* 3.76* 4.02  HGB 9.3* 10.0* 10.9*  HCT 28.7* 31.3* 33.5*  MCV 82.5 83.2 83.3  MCH 26.7 26.6 27.1  MCHC 32.4 31.9 32.5  RDW 14.4 14.3 13.9  PLT 282 306 263   Thyroid  Recent Labs  Lab 07/26/23 0559 07/26/23 0600  TSH  --  <0.010*  FREET4 0.98  --     BNP Recent Labs  Lab 07/28/23 0403 07/29/23 0327 07/30/23 0418  BNP 557.2* 574.4* 680.2*    DDimer  Recent Labs  Lab 07/25/23 0308  DDIMER 3.49*     Radiology    DG Chest Port 1 View  Result Date: 07/30/2023 CLINICAL DATA:  77 year old female with history of right-sided chest tube. EXAM: PORTABLE CHEST 1 VIEW COMPARISON:  Chest x-ray 07/29/2023. FINDINGS: Small bore right-sided chest tube stable in position with tip projecting over the medial aspect of the lower right hemithorax. No appreciable right-sided pneumothorax confidently identified at this time. Trace residual right pleural effusion. Ill-defined opacity at the right lung base which may reflect atelectasis and/or  consolidation. Left lung is clear. No left pleural effusion. No left pneumothorax. No pneumothorax no evidence of pulmonary edema. Heart size is normal. Upper mediastinal contours are within normal limits. Orthopedic fixation hardware in the lower cervical spine incidentally noted. IMPRESSION: 1. Stable position of right-sided chest tube with small residual right-sided pleural effusion. 2. Increasing atelectasis and/or consolidation in the right lung base. Electronically Signed   By: Trudie Reed M.D.   On: 07/30/2023 06:43   DG CHEST PORT 1 VIEW  Result Date: 07/29/2023 CLINICAL DATA:  Chest tube placement EXAM: PORTABLE CHEST 1 VIEW COMPARISON:  Earlier same day FINDINGS: Pigtail chest tube is been placed on the right, extending to the lower chest. Marked reduction in the amount of pleural density on the right. No visible pleural air. Better pulmonary aeration. IMPRESSION: Chest tube placed on the right. Marked reduction in the amount of pleural density on the right. Electronically Signed   By: Paulina Fusi M.D.   On: 07/29/2023 18:10   DG Chest 2 View  Result Date: 07/29/2023 CLINICAL DATA:  Shortness of breath.  Cough.  Aspiration. EXAM: CHEST - 2 VIEW COMPARISON:  07/28/2023 FINDINGS: Left chest remains clear. Moderate to large pleural effusion on the right with dependent pulmonary atelectasis/infiltrate. Allowing for technical factors, the findings appear similar to the recent films. Chronic shoulder arthropathy on the left. IMPRESSION: Moderate to large right pleural effusion with dependent pulmonary atelectasis/infiltrate on the right. Electronically Signed   By: Paulina Fusi M.D.   On: 07/29/2023 10:51    Cardiac Studies   Echo 07/26/2023  1. Distal septal apical hypokinesis . Left ventricular ejection fraction,  by estimation, is 50 to 55%. The left ventricle has low normal function.  The left ventricle has no regional wall motion abnormalities. There is  mild left ventricular hypertrophy   of the basal and septal segments. Left ventricular diastolic parameters  are indeterminate.   2. Right ventricular systolic function is normal. The right ventricular  size is normal.   3. A small pericardial effusion is present. The pericardial effusion is  anterior to the right ventricle.   4. The mitral valve is degenerative. Mild mitral valve regurgitation. No  evidence of mitral stenosis. Severe mitral annular calcification.   5. Tricuspid valve regurgitation is mild to moderate.  6. The aortic valve is tricuspid. There is moderate calcification of the  aortic valve. There is moderate thickening of the aortic valve. Aortic  valve regurgitation is trivial. Aortic valve sclerosis is present, with no  evidence of aortic valve stenosis.   7. The inferior vena cava is normal in size with greater than 50%  respiratory variability, suggesting right atrial pressure of 3 mmHg.    Patient Profile     77 y.o. female with PMH of RA, lewy-body dementia, prior DVT, Sjogren's syndrome, chronic diastolic CHF, mitral stenosis and hypertension who presented with chest pain  Assessment & Plan    Chest pain  - abnormal myoview in Jan 2024, cardiac cath discussed at the time, but patient preferred medical management.   - presented on 7/31 with intermittent chest pain. BNP elevated at 1109. Serial borderline borderline elevated but flat. CTA negative for PE, but shows a large R pleural effusion  - initial clinical picture concerning for pericarditis. CRP elevated. Given colchicine.   - TTE showed small pericardial effusion with RWMA in septum and apex  - plans for left and right heart cath today  - Risk and benefit of procedure explained to the patient who display clear understanding and agree to proceed.  Discussed with patient possible procedural risk include bleeding, vascular injury, renal injury, arrythmia, MI, stroke and loss of limb or life.   Pericarditis:  on colchicine  New onset  atrial fibrillation: mainitaining NSR on amiodarone. On IV heparin, will need DOAC once chest tube is out.   R pleural effusion: s/p thoracentesis with removal of 1.3 L of fluid, s/p chest tube, had 1.4 L additional output overnight  Chronic diastolic CHF: receiving IV diuretic.   Moderate MS: previous echo obtained at [facility] showed moderate MS and mild MR  Hypertension      For questions or updates, please contact [facility] Please consult [email or web address] for contact info under        Signed, Azalee Course, PA  07/30/2023, 10:29 AM     Patient seen and examined.  Agree with above documentation.  On exam, patient is alert, regular rate and rhythm, no murmurs, diminished breath sounds on right side, no LE edema or JVD.  Chest pain improved.  Planning LHC today given positive stress test earlier this year.  She reports dyspnea improved since chest tube placed yesterday.  Little Ishikawa, MD

## 2023-07-31 ENCOUNTER — Inpatient Hospital Stay (HOSPITAL_COMMUNITY): Payer: Medicare PPO

## 2023-07-31 ENCOUNTER — Other Ambulatory Visit (HOSPITAL_COMMUNITY): Payer: Self-pay

## 2023-07-31 ENCOUNTER — Encounter (HOSPITAL_COMMUNITY): Payer: Self-pay | Admitting: Cardiovascular Disease

## 2023-07-31 DIAGNOSIS — I300 Acute nonspecific idiopathic pericarditis: Secondary | ICD-10-CM | POA: Diagnosis not present

## 2023-07-31 DIAGNOSIS — J90 Pleural effusion, not elsewhere classified: Secondary | ICD-10-CM | POA: Diagnosis not present

## 2023-07-31 DIAGNOSIS — G459 Transient cerebral ischemic attack, unspecified: Secondary | ICD-10-CM

## 2023-07-31 DIAGNOSIS — N1831 Chronic kidney disease, stage 3a: Secondary | ICD-10-CM | POA: Diagnosis not present

## 2023-07-31 DIAGNOSIS — I480 Paroxysmal atrial fibrillation: Secondary | ICD-10-CM | POA: Diagnosis not present

## 2023-07-31 DIAGNOSIS — I5032 Chronic diastolic (congestive) heart failure: Secondary | ICD-10-CM | POA: Diagnosis not present

## 2023-07-31 DIAGNOSIS — G9341 Metabolic encephalopathy: Secondary | ICD-10-CM

## 2023-07-31 LAB — URINALYSIS, COMPLETE (UACMP) WITH MICROSCOPIC
Bilirubin Urine: NEGATIVE
Glucose, UA: NEGATIVE mg/dL
Ketones, ur: NEGATIVE mg/dL
Nitrite: NEGATIVE
Protein, ur: NEGATIVE mg/dL
RBC / HPF: >50 RBC/hpf (ref 0–5)
Specific Gravity, Urine: >1.046 — ABNORMAL HIGH (ref 1.005–1.030)
pH: 5 (ref 5.0–8.0)

## 2023-07-31 LAB — GLUCOSE, CAPILLARY: Glucose-Capillary: 77 mg/dL (ref 70–99)

## 2023-07-31 LAB — HEPARIN LEVEL (UNFRACTIONATED): Heparin Unfractionated: 0.21 IU/mL — ABNORMAL LOW (ref 0.30–0.70)

## 2023-07-31 LAB — PROCALCITONIN: Procalcitonin: 0.16 ng/mL

## 2023-07-31 LAB — AMMONIA: Ammonia: 21 umol/L (ref 9–35)

## 2023-07-31 MED ORDER — COLCHICINE 0.6 MG PO TABS
0.6000 mg | ORAL_TABLET | Freq: Every day | ORAL | Status: DC
  Administered 2023-08-01 – 2023-08-08 (×8): 0.6 mg via ORAL
  Filled 2023-07-31 (×8): qty 1

## 2023-07-31 MED ORDER — SODIUM CHLORIDE 0.9 % IV SOLN: INTRAVENOUS | Status: DC

## 2023-07-31 MED ORDER — IOHEXOL 350 MG/ML SOLN
60.0000 mL | Freq: Once | INTRAVENOUS | Status: AC | PRN
  Administered 2023-07-31: 60 mL via INTRAVENOUS

## 2023-07-31 MED ORDER — FUROSEMIDE 20 MG PO TABS
20.0000 mg | ORAL_TABLET | Freq: Every day | ORAL | Status: DC
Start: 2023-07-31 — End: 2023-07-31

## 2023-07-31 MED ORDER — HEPARIN (PORCINE) 25000 UT/250ML-% IV SOLN
1400.0000 [IU]/h | INTRAVENOUS | Status: DC
  Administered 2023-07-31: 1300 [IU]/h via INTRAVENOUS
  Administered 2023-08-01: 1350 [IU]/h via INTRAVENOUS
  Administered 2023-08-02 (×2): 1500 [IU]/h via INTRAVENOUS
  Filled 2023-07-31 (×4): qty 250

## 2023-07-31 NOTE — Progress Notes (Signed)
PROGRESS NOTE    Kristen Hansen  WUJ:[id number] DOB: January 18, 1946 DOA: 07/25/2023 PCP: Kerin Salen, PA-C   Brief Narrative:  Kristen Hansen is a 77 y.o. female with medical history significant of hypertension, hyperlipidemia, Lewy body dementia, Sjogren's syndrome, rheumatoid arthritis, chronic pain, DVT, spinal stenosis, diastolic CHF, depression, REM behavior disorder, orthostatic hypotension, CKD 3, GERD, hyperthyroidism presenting with chest pain. Her chest pain has atypical nature and present for 2 weeks intermittently. She was found to have a moderate pleural effusion. Cardiology was consulted and they are planning a catheterization given her cardiac history.   Assessment & Plan:   Principal Problem:   Pleural effusion Active Problems:   Rheumatoid arthritis (HCC)   HTN (hypertension)   Chronic bilateral low back pain without sciatica   Depression   Lewy body dementia without behavioral disturbance, psychotic disturbance, mood disturbance, or anxiety (HCC)   Spinal stenosis of lumbar region   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)   Chronic heart failure with preserved ejection fraction (HCC)   CKD (chronic kidney disease) stage 3, GFR 30-59 ml/min (HCC)   Gastroesophageal reflux disease with esophagitis   Mixed hyperlipidemia   Chest pain   Sjogren's syndrome (HCC)   Acute idiopathic pericarditis   PAF (paroxysmal atrial fibrillation) (HCC)   Need for management of chest tube   Chest Pain, exudative and recurrent right-sided pleural Effusion, Pericardial Effusion - Chest pain improved after thoracentesis. Given re-accumulation of fluid and exudative nature, PCCM was consulted. Appreciate their assistance. They placed a chest tube which had additional output of 1.4 L. Will monitor output of her effusion. Pt at risk for exudative effusion given her hx of autoimmune diseases. Per PCCM, her effusion might be a pseudo-exudative given diuretic use.   She appears to have had only 135 cc output from chest tube in last 24 hours.  Pulmonary managing.  At discharge, will have her follow-up with pulmonary outpatient along with rheumatology.   Acute on chronic diastolic CHF EF 50 to 55%, and some preceding history of exertional shortness of breath.   Pt had episode of worsening dyspnea on morning of 08/04 with coughing, CXR obtained showed volume overload. She was given IV lasix with improvement in her symptoms.  Cardiology on board.  Underwent cath which showed clean coronaries.   Pericardial Effusion She has mild pericarditis and is currently on colchicine for pericardial effusion along with aspirin per cardiology.   Paroxysmal Atrial Fibrillation: Cardiology following, rates controlled, on amiodarone and metoprolol oral as well as heparin drip, will switch to DOAC once chest tube is out.  Dysphagia: OP work up ongoing. EGD without any acute findings. Getting swallowing therapy. SLP following pt. Repeat MBBS shows pt has some retention of contrast and some aspiration concern but overall unchanged from prior study and likely secondary to progressing lewy body dementia. Placed on Dys 3 Diet here. Will need follow up with palliative care OP (see below).    Lewy Body Dementia: On pyridostigmine and clonzapem, Will continue here. Last saw neurologist one month ago. Appears to be progressing and pt may benefit from palliative care consult (see below).    GOC: Pt with lewey body dementia and multiple co-morbidities and worsening dysphagia. She may benefit from OP palliative care consult.    Orthostatic Hypotension: Pt with orthostatic hypotension. Suspect this is secondary to her lewy body dementia.  Continue TED hose.  Would encourage her to change positions slowly. Per husband, she has some dizziness at  home.    Hyponatremia: Na 130, stable.    Hypomagnesemia/Hypokalemia: Resolved.   Hx of DVT: D dimer elevated,  CTA negative, lower extremity doppler  which was negative for DVT.  Now on heparin drip for A-fib.     Hyperthyroidism: Recently started on methimazole. States she is complaint with this. Will continue here. FT4 normal and FT3 slightly decreased at 1.7. Thyroid ultrasound ordered and shows bilateral nodules that do not meet criteria for biopsy for dedicated follow up.  Will need OP endocrinology follow up.    Left Shoulder Pain: Has good PROM but pain with active ROM. Concern for RT injury. 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 Conservative measures for now.  Overall better, outpatient orthopedics follow-up postdischarge. PCP to arrange.   HTN:  Home meds: amlodipine 5 mg every day, irbesartan 300 mg every day, lopressor 25 mg BID.  Patient currently on Aldactone, irbesartan, metoprolol.  Blood pressure fairly controlled.   GERD: Continue home med protonix. Does not endorse symptoms of reflux.    Sjogren's Syndrome: Not on any pharmacotherapy.  Likely causing pericardial effusion and exudative right-sided pleural effusion, currently on colchicine for pericardial effusion may require outpatient rheumatology follow-up postdischarge.   RA: Was on enbrel. Last used 2 months ago.    Chronic back pain: Tylenol prn   MDD: Zoloft 50 mg every day. Continued here.    Trigeminal Neuralgia: On lamictal and trileptal. Both continued here.  Code stroke/ ? TIA: Patient was noted to have right-sided weakness by RN at around 6:30 AM. LKN was at about 9:00 PM last night when RN administered her meds. The patient was drowsy and poorly cooperative at that time, which were attributed to a recent Klonopin dose .  Code stroke was called, seen by neurology.  Underwent CT head which is unremarkable.  They recommended MRI.  Patient on my evaluation did not have any weakness.  Heparin was stopped due to concern of hemorrhagic stroke.   However CT head is negative.  Will defer to neurology for the timing of reinitiation of heparin.   Obesity: Estimated body mass index is 17.65 kg/m as calculated from the following:   Height as of this encounter: 5\' 10"  (1.778 m).   Weight as of this encounter: 55.8 kg.   DVT prophylaxis: Place TED hose Start: 07/27/23 1141   Code Status: Full Code  Family Communication:  None present at bedside.   Status is: Inpatient Remains inpatient appropriate because: Patient with chest tube   Estimated body mass index is 16.96 kg/m as calculated from the following:   Height as of this encounter: 5\' 10"  (1.778 m).   Weight as of this encounter: 53.6 kg.    Nutritional Assessment: Body mass index is 16.96 kg/m.Marland Kitchen Seen by dietician.  I agree with the assessment and plan as outlined below: Nutrition Status:        . Skin Assessment: I have examined the patient's skin and I agree with the wound assessment as performed by the wound care RN as outlined below:    Consultants:  Neurology, critical care and cardiology  Procedures:  As above  Antimicrobials:  Anti-infectives (From admission, onward)    Start     Dose/Rate Route Frequency Ordered Stop   07/26/23 0540  valACYclovir (VALTREX) tablet 1,000 mg        1,000 mg Oral Every 12 hours PRN 07/26/23  1610           Subjective: Patient seen and examined.  She was fully alert and partially oriented.  Appeared comfortable with no complaints.  Objective: Vitals:   07/31/23 0857 07/31/23 0927 07/31/23 0957 07/31/23 1026  BP: (!) 157/55 (!) 145/68 (!) 147/58 (!) 148/60  Pulse: 74 73 71 74  Resp: 16 20 19 18   Temp:      TempSrc:      SpO2: 100% 97% 96% 98%  Weight:      Height:        Intake/Output Summary (Last 24 hours) at 07/31/2023 1056 Last data filed at 07/30/2023 2127 Gross per 24 hour  Intake 543.75 ml  Output 335 ml  Net 208.75 ml   Filed Weights   07/25/23 0251 07/27/23 0110 07/31/23 0403  Weight: 56.2 kg 55.8  kg 53.6 kg    Examination:  General exam: Appears calm and comfortable  Respiratory system: Diminished sounds due to poor inspiratory effort. Cardiovascular system: S1 & S2 heard, RRR. No JVD, murmurs, rubs, gallops or clicks. No pedal edema. Gastrointestinal system: Abdomen is nondistended, soft and nontender. No organomegaly or masses felt. Normal bowel sounds heard. Central nervous system: Alert but partially oriented.  No focal deficit. Extremities: Symmetric 5 x 5 power. Skin: No rashes, lesions or ulcers  Data Reviewed: I have personally reviewed following labs and imaging studies  CBC: Recent Labs  Lab 07/27/23 0146 07/28/23 0403 07/29/23 0327 07/30/23 0418 07/30/23 1225 07/30/23 1226 07/31/23 0714  WBC 10.0 8.9 8.1 9.8  --   --  14.1*  NEUTROABS 7.1 6.1 4.8 7.0  --   --  11.5*  HGB 10.0* 9.3* 10.0* 10.9* 9.9* 9.9* 12.2  HCT 30.4* 28.7* 31.3* 33.5* 29.0* 29.0* 37.7  MCV 84.0 82.5 83.2 83.3  --   --  82.0  PLT 287 282 306 263  --   --  307   Basic Metabolic Panel: Recent Labs  Lab 07/27/23 0146 07/28/23 0403 07/29/23 0327 07/30/23 0418 07/30/23 1225 07/30/23 1226 07/30/23 1909 07/31/23 0714  NA 130* 131* 131* 131* 133* 135  --  130*  K 3.5 3.7 4.3 3.8 3.2* 3.1* 3.3* 4.6  CL 98 101 102 94*  --   --   --  94*  CO2 21* 21* 21* 24  --   --   --  21*  GLUCOSE 101* 104* 95 103*  --   --   --  73  BUN 21 20 16 16   --   --   --  17  CREATININE 0.84 0.95 1.00 1.04*  --   --   --  1.15*  CALCIUM 8.4* 8.1* 8.5* 8.9  --   --   --  8.9  MG 1.7 2.2 2.0 1.9  --   --   --  1.7   GFR: Estimated Creatinine Clearance: 35.2 mL/min (A) (by C-G formula based on SCr of 1.15 mg/dL (H)). Liver Function Tests: Recent Labs  Lab 07/27/23 0146 07/28/23 0403 07/29/23 0327 07/30/23 0418 07/31/23 0714  AST 11* 8* 18 24 43*  ALT 12 12 14 17 27   ALKPHOS 104 108 121 143* 164*  BILITOT 0.6 <0.1* 0.6 0.5 0.4  PROT 5.3* 5.0* 5.6* 6.0* 6.1*  ALBUMIN 2.1* 1.9* 2.2* 2.4* 2.5*   No  results for input(s): "LIPASE", "AMYLASE" in the last 168 hours. No results for input(s): "AMMONIA" in the last 168 hours. Coagulation Profile: Recent Labs  Lab 07/25/23 0636  INR  1.2   Cardiac Enzymes: No results for input(s): "CKTOTAL", "CKMB", "CKMBINDEX", "TROPONINI" in the last 168 hours. BNP (last 3 results) No results for input(s): "PROBNP" in the last 8760 hours. HbA1C: No results for input(s): "HGBA1C" in the last 72 hours. CBG: Recent Labs  Lab 07/30/23 2327 07/31/23 0627  GLUCAP 97 77   Lipid Profile: No results for input(s): "CHOL", "HDL", "LDLCALC", "TRIG", "CHOLHDL", "LDLDIRECT" in the last 72 hours. Thyroid Function Tests: No results for input(s): "TSH", "T4TOTAL", "FREET4", "T3FREE", "THYROIDAB" in the last 72 hours. Anemia Panel: No results for input(s): "VITAMINB12", "FOLATE", "FERRITIN", "TIBC", "IRON", "RETICCTPCT" in the last 72 hours. Sepsis Labs: Recent Labs  Lab 07/26/23 0600 07/29/23 1001 07/30/23 0418 07/31/23 0902  PROCALCITON 0.18 <0.10 0.10 0.16    Recent Results (from the past 240 hour(s))  Culture, body fluid w Gram Stain-bottle     Status: None   Collection Time: 07/26/23 12:33 PM   Specimen: Pleura  Result Value Ref Range Status   Specimen Description PLEURAL  Final   Special Requests PLEURAL  Final   Culture   Final    NO GROWTH 5 DAYS Performed at [facility], [address], [place], [place] [address]    Report Status 07/31/2023 FINAL  Final  Gram stain     Status: None   Collection Time: 07/26/23 12:33 PM   Specimen: Pleura  Result Value Ref Range Status   Specimen Description PLEURAL  Final   Special Requests NONE  Final   Gram Stain   Final    RARE WBC PRESENT,BOTH PMN AND MONONUCLEAR NO ORGANISMS SEEN Performed at [facility], [address], [place], [place] [address]    Report Status 07/26/2023 FINAL  Final  Respiratory (~20 pathogens) panel by PCR     Status: None   Collection Time: 07/27/23 12:28  PM   Specimen: Nasopharyngeal Swab; Respiratory  Result Value Ref Range Status   Adenovirus NOT DETECTED NOT DETECTED Final   Coronavirus 229E NOT DETECTED NOT DETECTED Final    Comment: (NOTE) The Coronavirus on the Respiratory Panel, DOES NOT test for the novel  Coronavirus (2019 nCoV)    Coronavirus HKU1 NOT DETECTED NOT DETECTED Final   Coronavirus NL63 NOT DETECTED NOT DETECTED Final   Coronavirus OC43 NOT DETECTED NOT DETECTED Final   Metapneumovirus NOT DETECTED NOT DETECTED Final   Rhinovirus / Enterovirus NOT DETECTED NOT DETECTED Final   Influenza A NOT DETECTED NOT DETECTED Final   Influenza B NOT DETECTED NOT DETECTED Final   Parainfluenza Virus 1 NOT DETECTED NOT DETECTED Final   Parainfluenza Virus 2 NOT DETECTED NOT DETECTED Final   Parainfluenza Virus 3 NOT DETECTED NOT DETECTED Final   Parainfluenza Virus 4 NOT DETECTED NOT DETECTED Final   Respiratory Syncytial Virus NOT DETECTED NOT DETECTED Final   Bordetella pertussis NOT DETECTED NOT DETECTED Final   Bordetella Parapertussis NOT DETECTED NOT DETECTED Final   Chlamydophila pneumoniae NOT DETECTED NOT DETECTED Final   Mycoplasma pneumoniae NOT DETECTED NOT DETECTED Final    Comment: Performed at [facility], [address], [place], [place] [address]     Radiology Studies: DG CHEST PORT 1 VIEW  Result Date: 07/31/2023 CLINICAL DATA:  77 year old female with right side deficit, Code stroke. Right chest tube placed recently for right pleural effusion despite earlier thoracentesis. EXAM: PORTABLE CHEST 1 VIEW COMPARISON:  Portable chest 07/30/2023 and earlier. FINDINGS: Portable AP semi upright view at 0548 hours. Pigtail right chest tube  appears stable, although difficult to exclude kinking (arrow). But veiling right lung base opacity remains improved since 07/29/2023. No superimposed pneumothorax or pulmonary edema. Mediastinal contours remain within normal limits. Left lung appears stable and negative.  Partially visible ACDF. Paucity of bowel gas in the visible abdomen. IMPRESSION: Difficult to exclude kinked right pigtail chest tube (arrow), but right pleural effusion remains regressed since 07/29/2023, and no new cardiopulmonary abnormality identified. A lateral view may be valuable when feasible. Electronically Signed   By: Odessa Fleming M.D.   On: 07/31/2023 07:03   CT HEAD CODE STROKE WO CONTRAST  Result Date: 07/31/2023 CLINICAL DATA:  Code stroke. 77 year old female with right side deficit. EXAM: CT HEAD WITHOUT CONTRAST TECHNIQUE: Contiguous axial images were obtained from the base of the skull through the vertex without intravenous contrast. RADIATION DOSE REDUCTION: This exam was performed according to the departmental dose-optimization program which includes automated exposure control, adjustment of the mA and/or kV according to patient size and/or use of iterative reconstruction technique. COMPARISON:  Brain MRI 01/24/2021.  Head CT 06/29/2022. FINDINGS: Brain: Cerebral volume appears stable and within normal limits for age. No midline shift, ventriculomegaly, mass effect, evidence of mass lesion, intracranial hemorrhage or evidence of cortically based acute infarction. Patchy bilateral white matter hypodensity, asymmetrically involving the deep white matter capsules on the left, appear stable from the previous MRI. No convincing acute deep gray nuclei or brainstem finding. But there is a possible new small right cerebellar infarct since last year on coronal image 20. Vascular: No suspicious intracranial vascular hyperdensity. Calcified atherosclerosis at the skull base. Skull: No acute osseous abnormality identified. Sinuses/Orbits: Visualized paranasal sinuses and mastoids are stable and well aerated. Other: No gaze deviation. Visualized orbits and scalp soft tissues are within normal limits. ASPECTS [facility] Stroke Program Early CT Score) Total score (0-10 with 10 being normal): 10 IMPRESSION: 1. No  acute cortically based infarct or acute intracranial hemorrhage identified. Chronic white matter disease and possible new small right cerebellar infarct since last year. 2.  ASPECTS 10. 3. These results were communicated to Dr. Otelia Limes at 6:57 am on 07/31/2023 by text [place] via the [facility] messaging system. Electronically Signed   By: Odessa Fleming M.D.   On: 07/31/2023 06:58   CARDIAC CATHETERIZATION  Result Date: 07/30/2023 No angiographic evidence of CAD Normal right and left heart pressures (RA 7, RV 40/4/8, PA 44/12 mean 22, PCWP 11) Recommendations: No further ischemic workup.   DG Chest Port 1 View  Result Date: 07/30/2023 CLINICAL DATA:  77 year old female with history of right-sided chest tube. EXAM: PORTABLE CHEST 1 VIEW COMPARISON:  Chest x-ray 07/29/2023. FINDINGS: Small bore right-sided chest tube stable in position with tip projecting over the medial aspect of the lower right hemithorax. No appreciable right-sided pneumothorax confidently identified at this time. Trace residual right pleural effusion. Ill-defined opacity at the right lung base which may reflect atelectasis and/or consolidation. Left lung is clear. No left pleural effusion. No left pneumothorax. No pneumothorax no evidence of pulmonary edema. Heart size is normal. Upper mediastinal contours are within normal limits. Orthopedic fixation hardware in the lower cervical spine incidentally noted. IMPRESSION: 1. Stable position of right-sided chest tube with small residual right-sided pleural effusion. 2. Increasing atelectasis and/or consolidation in the right lung base. Electronically Signed   By: Trudie Reed M.D.   On: 07/30/2023 06:43   DG CHEST PORT 1 VIEW  Result Date: 07/29/2023 CLINICAL DATA:  Chest tube placement EXAM: PORTABLE CHEST 1 VIEW COMPARISON:  Earlier same day FINDINGS: Pigtail chest tube is been placed on the right, extending to the lower chest. Marked reduction in the amount of pleural density on the right. No visible  pleural air. Better pulmonary aeration. IMPRESSION: Chest tube placed on the right. Marked reduction in the amount of pleural density on the right. Electronically Signed   By: Paulina Fusi M.D.   On: 07/29/2023 18:10    Scheduled Meds:  amiodarone  200 mg Oral BID   aspirin  81 mg Oral Daily   colchicine  0.6 mg Oral BID   irbesartan  75 mg Oral Daily   lamoTRIgine  150 mg Oral Daily   methIMAzole  10 mg Oral Daily   metoprolol tartrate  25 mg Oral BID   montelukast  10 mg Oral QHS   Oxcarbazepine  300 mg Oral BID   pantoprazole  40 mg Oral Daily   pravastatin  40 mg Oral QAC breakfast   pyridostigmine  60 mg Oral Q8H   sertraline  50 mg Oral Daily   sodium chloride flush  3 mL Intravenous Q12H   sodium chloride flush  3 mL Intravenous Q12H   spironolactone  12.5 mg Oral Daily   Continuous Infusions:  sodium chloride     sodium chloride       LOS: 6 days   Hughie Closs, MD [facility]  07/31/2023, 10:56 AM   *Please note that this is a verbal dictation therefore any spelling or grammatical errors are due to the "[employer]agon [employer] One" system interpretation.  Please [place] via Amion and do not message via secure chat for urgent patient care matters. Secure chat can be used for non urgent patient care matters.  How to contact the [facility] Attending or Consulting provider 7A - 7P or covering provider during after hours 7P -7A, for this patient?  Check the care team in [facility] and look for a) attending/consulting [facility] provider listed and b) the [facility] team listed. [place] or secure chat 7A-7P. Log into [email or web address] and use [place]'s universal password to access. If you do not have the password, please contact the hospital operator. Locate the [facility] provider you are looking for under [facility] and [place] to a number that you can be directly reached. If you still have difficulty reaching the provider, please Ems the [facility] (Director on Call) for the Hospitalists listed on amion for  assistance.

## 2023-07-31 NOTE — Plan of Care (Signed)
  Problem: Education: Goal: Knowledge of General Education information will improve Description: Including pain rating scale, medication(s)/side effects and non-pharmacologic comfort measures Outcome: Progressing   Problem: Health Behavior/Discharge Planning: Goal: Ability to manage health-related needs will improve Outcome: Progressing   Problem: Clinical Measurements: Goal: Ability to maintain clinical measurements within normal limits will improve Outcome: Progressing Goal: Will remain free from infection Outcome: Progressing Goal: Diagnostic test results will improve Outcome: Progressing Goal: Respiratory complications will improve Outcome: Progressing Goal: Cardiovascular complication will be avoided Outcome: Progressing   Problem: Activity: Goal: Risk for activity intolerance will decrease Outcome: Progressing   Problem: Nutrition: Goal: Adequate nutrition will be maintained Outcome: Progressing   Problem: Coping: Goal: Level of anxiety will decrease Outcome: Progressing   Problem: Elimination: Goal: Will not experience complications related to bowel motility Outcome: Progressing Goal: Will not experience complications related to urinary retention Outcome: Progressing   Problem: Pain Managment: Goal: General experience of comfort will improve Outcome: Progressing   Problem: Safety: Goal: Ability to remain free from injury will improve Outcome: Progressing   Problem: Skin Integrity: Goal: Risk for impaired skin integrity will decrease Outcome: Progressing   Problem: Activity: Goal: Ability to tolerate increased activity will improve Outcome: Progressing   Problem: Clinical Measurements: Goal: Ability to maintain a body temperature in the normal range will improve Outcome: Progressing   Problem: Respiratory: Goal: Ability to maintain adequate ventilation will improve Outcome: Progressing Goal: Ability to maintain a clear airway will improve Outcome:  Progressing   Problem: Education: Goal: Understanding of CV disease, CV risk reduction, and recovery process will improve Outcome: Progressing Goal: Individualized Educational Video(s) Outcome: Progressing   Problem: Activity: Goal: Ability to return to baseline activity level will improve Outcome: Progressing   Problem: Cardiovascular: Goal: Ability to achieve and maintain adequate cardiovascular perfusion will improve Outcome: Progressing Goal: Vascular access site(s) Level 0-1 will be maintained Outcome: Progressing   Problem: Health Behavior/Discharge Planning: Goal: Ability to safely manage health-related needs after discharge will improve Outcome: Progressing

## 2023-07-31 NOTE — Progress Notes (Signed)
ANTICOAGULATION CONSULT NOTE  Pharmacy Consult for Heparin Indication: atrial fibrillation  Allergies  Allergen Reactions   Morphine And Codeine Hives   Prevacid [Lansoprazole] Other (See Comments)    unknown   Provigil [Modafinil] Other (See Comments)    dizziness   Adhesive [Tape] Rash    bandaids     Patient Measurements: Height: 5\' 10"  (177.8 cm) Weight: 53.6 kg (118 lb 2.7 oz) IBW/kg (Calculated) : 68.5 Heparin Dosing Weight: 56 kg  Vital Signs: Temp: 98 F (36.7 C) (08/06 1317) Temp Source: Oral (08/06 1717) BP: 133/58 (08/06 2141) Pulse Rate: 69 (08/06 2141)  Labs: Recent Labs    07/29/23 0327 07/30/23 0418 07/30/23 1225 07/30/23 1226 07/31/23 0714 07/31/23 2141  HGB 10.0* 10.9* 9.9* 9.9* 12.2  --   HCT 31.3* 33.5* 29.0* 29.0* 37.7  --   PLT 306 263  --   --  307  --   HEPARINUNFRC 0.51 0.49  --   --   --  0.21*  CREATININE 1.00 1.04*  --   --  1.15*  --     Estimated Creatinine Clearance: 35.2 mL/min (A) (by C-G formula based on SCr of 1.15 mg/dL (H)).  Assessment: Patient is a 34 yof admitted for chest pain and new onset afib. Patient found to have pericardial effusion/pericarditis. She had thoracentesis 8/1 with 1.3L removed and now s/p cath 8/5. Code stroke called 8/5 and CT negative with MRI planned  Heparin level down to subtherapeutic  (0.21) on infusion at 1300 units/hr. No issues with line or bleeding reported per RN.  Goal of Therapy:  Heparin level 0.3-0.5 units/ml Monitor platelets by anticoagulation protocol: Yes   Plan:   Increase heparin drip to 1350 units/hr Heparin level in 8 hours  Christoper Fabian, PharmD, BCPS Please see amion for complete clinical pharmacist phone list 07/31/2023 10:54 PM

## 2023-07-31 NOTE — Progress Notes (Addendum)
Patient Name: Kristen Hansen Date of Encounter: 07/31/2023 [facility] Cardiologist: None   Interval Summary  .    77 y.o. female with PMH of HTN, HLD, , Lewy body dementia, Sjogren's syndrome, rheumatoid arthritis, chronic pain, DVT, spinal stenosis, diastolic CHF, depression, CKD III, GERD, hyperthyroidism, cardiology is consulted and following for chest pain. Echo 8/1 showed LVEF 50-55%, distal septal apical hypokinesis, normal RV, small pericardial effsuion, mild MR, severe MAC, mild to moderate TR, aortic sclerosis. R/L heart cath 8/5 showed no evidence of CADm normal R/L heart pressures (RA 7, RV 40/4/8, PA 44/12 mean 22, PCWP 11) . Chest pain felt due to pericarditis given elevated CRP , started on colchicine. She was also noted in new onset paroxysmal A fib this admission, converted on amiodarone.  Course is further complicated by code stroke 8/6 AM due to right sided weakness and somnolence. Neuro concerned for TIA and delirium, MRI is pending.   Patient is lethargic this morning, opened eye to loud voice, oriented to self, mild confusion, minimally verbal, followed one step commands. She denied having any chest pain. She states she is sleepy. Husband is at bedside.    Vital Signs .    Vitals:   07/31/23 0013 07/31/23 0403 07/31/23 0630 07/31/23 0809  BP: (!) 106/59 (!) 140/67  (!) 148/60  Pulse: (!) 57 64 73   Resp: 20 19 19    Temp: 98.5 F (36.9 C) 97.7 F (36.5 C)  99.4 F (37.4 C)  TempSrc: Oral Oral  Axillary  SpO2: 95% 99% 98%   Weight:  53.6 kg    Height:        Intake/Output Summary (Last 24 hours) at 07/31/2023 1914 Last data filed at 07/30/2023 2127 Gross per 24 hour  Intake 543.75 ml  Output 335 ml  Net 208.75 ml      07/31/2023    4:03 AM 07/27/2023    1:10 AM 07/25/2023    2:51 AM  Last 3 Weights  Weight (lbs) 118 lb 2.7 oz 123 lb 0.3 oz 124 lb  Weight (kg) 53.6 kg 55.8 kg 56.246 kg      Telemetry/ECG    Sinus rhythm 60s on telemetry, EKG today  showed sinus rhythm 74bpm, improved diffuse STE    - Personally Reviewed  Physical Exam .   GEN: Lethargic, GCS 13( E3, V4, M6) Neck: No JVD Cardiac: RRR, no murmurs, rubs, or gallops.  Respiratory: Clear to auscultation bilaterally. On room air. Chest tube with serosanguinous drainage  GI: Soft, nontender, non-distended  MS: No edema Right radial site with dressing, no bleeding or hematoma, pulse 2+  Assessment & Plan .     Chest pain due to pericarditis  Small pericardial effusion  Right pleural effusion  - history of dyspnea and chest pain on exertion,  nuclear stress test 12/2022 showed a partially redistributing anterior wall defect without ischemia. Cardiac catheterization was discussed at that time but patient preferred to proceed with medical management -  7/31 presents with progressive dyspnea on exertion as well as chest heaviness/ tightness that is worse with exertion but also worse when coughing - EKG showed diffuse STE - High-sensitivity troponin 30 >> 34 >> 38 >> 42 >> 41>>26 not consistent with ACS  - CRP elevated 15.6, POA - Echo 8/1 showed LVEF 50-55%, distal septal apical hypokinesis, normal RV, small pericardial effsuion, mild MR, severe MAC, mild to moderate TR, aortic sclerosis - R/L heart cath 8/5 showed no evidence of CAD, normal  pressures  - chest pain is due to pericarditis, etiology likely viral versus underlying connective tissue disease, pleural fluid exudative with negative cytology from 8/1,  no clinical evidence of cardiac tamponade, started on colchicine 0.6mg  daily  Newly diagnosed paroxysmal A fib  - TSH <0.01 on 07/26/23, FT4 0.98, known hyperthyroidism and on methimazole, followed by primary - currently on metoprolol 25mg  BID and amiodarone 200mg  BID, remains in sinus rhythm, on IV heparin gtt, ultimate DOAC at time of discharge when chest tube removed   Acute metabolic encephalopathy -  increasingly lethargic with right sided weakness 07/31/23 morning,  code stroke was called, CT stroke study showed no acute cortically based infarct or acute intracranial hemorrhage identified. Chronic white matter disease and possible new small right cerebellar infarct since last year. Neuro consulted, felt presentation is consistent with TIA versus hypoactive delirium, she no longer has right sided deficit but remains lethargic today  - agree with MRI  Chronic diastolic heart failure  Mitral valve disease  - previous echo obtained at [facility] showed moderate MS and mild MR  - Echo 07/26/23 here showed LVEF 50-55%, mild LVH, normal RV, small pericardial effusion, mild MR, severe MAC, mild to moderate TR, trivial AI, aortic sclerosis  - clinically she appears dehydrated, no indication for IV loop diuretic at this time  - continue current regimen irbesartan, metoprolol, spironolactone for HTN   - routine outpatient Echo   HTN - BP elevated up to 160s systolic, continue  irbesartan, metoprolol, spironolactone; may hold for permissive HTN if this is recommended by neurology   Pleural effusion Hyperthyroidism Sjogren's syndrome Rheumatoid arthritis Chronic back pain Lewy-body dementia Hyperlipidemia  Depression - per primary team    For questions or updates, please contact [facility] Please consult [email or web address] for contact info under   Signed, Cyndi Bender, NP    Patient seen and examined.  Agree with above documentation.  On exam, patient is somnolent but arousable and oriented x 2 (did not know year), regular rate and rhythm, no murmurs, lungs CTAB, no LE edema or JVD.  Cath yesterday showed normal coronary arteries, normal filling pressures.  Cr mildly increased.  Will hold lasix.  Stroke code called last night, seen by neurology and suspected TIA versus delirium.  Head CT unremarkable, planning brain MRI.  Little Ishikawa, MD

## 2023-07-31 NOTE — Progress Notes (Signed)
NAME:  Kristen Hansen, MRN:  [id number], DOB:  10/08/1946, LOS: 6 ADMISSION DATE:  07/25/2023, CONSULTATION DATE: 07/27/2023 REFERRING MD: Dr. Thedore Mins, CHIEF COMPLAINT: Pleural effusion  History of Present Illness:  Asked to see for recurrent pleural effusion Recently had a thoracenteses for 1.1 L of exudative fluid  Denies any significant complaints at present  Presented to the hospital with chest pain, shortness of breath with activity  Pertinent  Medical History   Past Medical History:  Diagnosis Date   Back pain    Cancer (HCC)    Skin, basal cell   Dyspnea    Fatigue    GERD (gastroesophageal reflux disease)    Headache(784.0)    history of migraines   History of kidney stones    Hypercholesteremia    Hypertension    Leg swelling    Bilateral   Occasional tremors    mild hand tremors   Osteoarthritis    PONV (postoperative nausea and vomiting)    RBD (REM behavioral disorder)    Rheumatoid arthritis(714.0)    Scoliosis of lumbar spine    Spinal stenosis    Multiple levels   Tic douloureux    Trigeminal neuralgia 03/30/2015   Vision abnormalities    Significant Hospital Events: Including procedures, antibiotic start and stop dates in addition to other pertinent events   8/1 thoracentesis 1.3 L 8/4 chest tube placement> 1.35L drained that shift  Interim History / Subjective:  Code stroke called overnight for new R-sided weakness. >1600 cc in chest tube [facility]. She remains sleepy today but her husband reports she has been more awake at times when she is stimulated.   Objective   Blood pressure (!) 148/60, pulse 74, temperature 99.4 F (37.4 C), temperature source Axillary, resp. rate 18, height 5\' 10"  (1.778 m), weight 53.6 kg, SpO2 98%.        Intake/Output Summary (Last 24 hours) at 07/31/2023 1159 Last data filed at 07/31/2023 1100 Gross per 24 hour  Intake 543.75 ml  Output 335 ml  Net 208.75 ml   Filed Weights   07/25/23 0251 07/27/23 0110 07/31/23 0403   Weight: 56.2 kg 55.8 kg 53.6 kg    Examination: Frail, elderly woman lying in bed sleeping [place]/AT, eyes anicteric [place] to stimulation, appears lethargic and delayed with responses. She can lift both arms off the bed and moves both toes but requires more promoting to move her left extremities. She requires repeat prompting to drop them back on the bed as well.   Abd soft, NT No cyanosis or edema. Ted hose. Skin warm, dry   Na+  130 BUN 17 Cr 1.15 BNP 356 CRP 10 WBC 14.1 H/H 12.2/37.7 CXR personally reviewed> resolved effusion, chest tube remains in appropriate position. Ongoing opacity in R lower hemithorax.  Pleural fluid: no repeat studies sent on 8/4 Path from 8/1: no malignant cells  Resolved Hospital Problem list     Assessment & Plan:  Right sided pleural effusion, weakly exudative - differential diagnosis serositis due to pleuropericarditis, aspiration related reactive effusion. Heart failure with diuresis is most likely with such low cellularity. Normal glucose suggests not RA-related.  - cytology negative for malignancy -con't chest tube to suction, had >200cc out in one shift yesterday.  -CT chest to evaluate for parenchymal or pleural pathology -check RUQ [place] to evaluate for liver pathology  Acute encephalopathy -checking ammonia -brain MRI ordered  Hyponatremia- unlikely to fully explain her mental status, but not helping -stopping diuresis -agree with checking  urine studies  Acute on chronic HFpEF, new RWMA on echo. Normal filling pressures on RHC.  Atrial fibrillation, new onset -management per Cardiology -stopping diuretics today  Pericarditis- autoimmune? -Appreciate Cardiology's management -con't colchicine  History of Sjogren's syndrome & rheumatoid arthritis; uptrending CRP -on Etanercept PTA   Steffanie Dunn, DO 07/31/23 12:11 PM [facility]  For contact information, see Amion. If no response to pager, please call  PCCM consult pager. After hours, 7PM- 7AM, please call Elink.

## 2023-07-31 NOTE — Progress Notes (Signed)
Physical Therapy Treatment Patient Details Name: Kristen Hansen MRN: [id number] DOB: 1946-11-09 Today's Date: 07/31/2023   History of Present Illness Pt is 77 yo female presenting [facility] ED with chest pain. Chest pain has been moving and is increased with palpation. PMH: HTN, hyperlipidemia, Lewy body dementia, Sjogren's syndrome, rheumatoid arthritis, chronic pain, DVT, Spine disease, spinal stenosis, diastolic CHF, depression, REM behavior disorder, orthostatic Hypotension, CKD 3, GERD, hypothrydoidism.    PT Comments  Pt was seen for mobility but due to late code stroke is withheld for mobility.  Pt is now not as concerning per nsg to have sustained a stroke, agreed to bed level tx.  Pt is gently assisted to do ROM to legs, and assisted CNA to help reposition for bed care of [place].  Follow up with her after testing to see what her results are and will mobilize as permitted.  Reconsider dc recommendations as are appropriate.   If plan is discharge home, recommend the following: A little help with walking and/or transfers;Assistance with cooking/housework;Assist for transportation;Help with stairs or ramp for entrance   Can travel by private vehicle        Equipment Recommendations  None recommended by PT    Recommendations for Other Services       Precautions / Restrictions Precautions Precautions: Fall Precaution Comments: watch BP, chest tube Restrictions Weight Bearing Restrictions: No     Mobility  Bed Mobility Overal bed mobility: Needs Assistance Bed Mobility:  (scooting up bed)           General bed mobility comments: total assist to scoot up with care for chest tube    Transfers                   General transfer comment: deferred over changes, MRI and EEG pending    Ambulation/Gait                   Stairs             Wheelchair Mobility     Tilt Bed    Modified Rankin (Stroke Patients Only)       Balance                Standing balance comment: holding for MRI and EEG                            Cognition Arousal/Alertness: Lethargic Behavior During Therapy: Flat affect Overall Cognitive Status: History of cognitive impairments - at baseline                                 General Comments: pt is more somnolent than usual        Exercises General Exercises - Lower Extremity Ankle Circles/Pumps: AAROM, 5 reps Long Arc Quad: AAROM, 10 reps Heel Slides: AAROM, 10 reps Hip ABduction/ADduction: AAROM, 10 reps Straight Leg Raises: AAROM, 10 reps Hip Flexion/Marching: AAROM, 10 reps    General Comments General comments (skin integrity, edema, etc.): Pt is lethargic and in bed, has been withheld for mobility pending testing to see if neuro change has occurred.      Pertinent Vitals/Pain Pain Assessment Pain Assessment: Faces Faces Pain Scale: No hurt    Home Living  Prior Function            PT Goals (current goals can now be found in the care plan section) Acute Rehab PT Goals Patient Stated Goal: none reported    Frequency    Min 1X/week      PT Plan Current plan remains appropriate    Co-evaluation              AM-PAC PT "6 Clicks" Mobility   Outcome Measure  Help needed turning from your back to your side while in a flat bed without using bedrails?: A Little Help needed moving from lying on your back to sitting on the side of a flat bed without using bedrails?: A Little Help needed moving to and from a bed to a chair (including a wheelchair)?: A Little Help needed standing up from a chair using your arms (e.g., wheelchair or bedside chair)?: A Little Help needed to walk in hospital room?: A Lot Help needed climbing 3-5 steps with a railing? : Total 6 Click Score: 15    End of Session   Activity Tolerance: Treatment limited secondary to medical complications (Comment) Patient left: in bed;with call  bell/phone within reach;with bed alarm set;with family/visitor present;with nursing/sitter in room Nurse Communication: Mobility status PT Visit Diagnosis: Unsteadiness on feet (R26.81);Other abnormalities of gait and mobility (R26.89)     Time: 1340-1403 PT Time Calculation (min) (ACUTE ONLY): 23 min  Charges:    $Therapeutic Exercise: 23-37 mins $Therapeutic Activity: 23-37 mins PT General Charges $$ ACUTE PT VISIT: 1 Visit            Ivar Drape 07/31/2023, 3:32 PM  Samul Dada, PT PhD Acute Rehab Dept. Number: [facility] [id number] and [facility] [phone]

## 2023-07-31 NOTE — Consult Note (Signed)
NEURO HOSPITALIST CONSULT NOTE   Requestig physician: Dr. Jacqulyn Bath  Reason for Consult: Acute onset of right sided weakness  History obtained from:  RN and Chart     HPI:                                                                                                                                          Kristen Hansen is an 77 y.o. female with a complex PMHx as outlined below including Lewy body dementia, recent admission for heart catheterization complicated by pleural effusion, who was noted by her RN this morning to have AMS with right sided weakness. LKN was at about 9:00 PM last night when RN administered her meds. The patient was drowsy and poorly cooperative at that time, which were attributed to a recent Klonopin dose. This morning, when checking on the patient, she continued to be somnolent, so NIHSS was performed, revealing right sided weakness. Code Stroke was then called.   Past Medical History:  Diagnosis Date   Back pain    Cancer (HCC)    Skin, basal cell   Dyspnea    Fatigue    GERD (gastroesophageal reflux disease)    Headache(784.0)    history of migraines   History of kidney stones    Hypercholesteremia    Hypertension    Leg swelling    Bilateral   Occasional tremors    mild hand tremors   Osteoarthritis    PONV (postoperative nausea and vomiting)    RBD (REM behavioral disorder)    Rheumatoid arthritis(714.0)    Scoliosis of lumbar spine    Spinal stenosis    Multiple levels   Tic douloureux    Trigeminal neuralgia 03/30/2015   Vision abnormalities     Past Surgical History:  Procedure Laterality Date   ABDOMINAL HYSTERECTOMY     AUGMENTATION MAMMAPLASTY Bilateral    COLONOSCOPY     IR THORACENTESIS ASP PLEURAL SPACE W/IMG GUIDE  07/26/2023   JOINT REPLACEMENT     right uni knee, right hip with 2 revisions   KIDNEY STONE SURGERY Bilateral    KNEE ARTHROTOMY Right 02/27/2018   Procedure: RIGHT KNEE ARTHROTOMY; scar excision;   Surgeon: Ollen Gross, MD;  Location: [facility];  Service: Orthopedics;  Laterality: Right;   MOHS SURGERY     RIGHT/LEFT HEART CATH AND CORONARY ANGIOGRAPHY N/A 07/30/2023   Procedure: RIGHT/LEFT HEART CATH AND CORONARY ANGIOGRAPHY;  Surgeon: Kathleene Hazel, MD;  Location: [facility];  Service: Cardiovascular;  Laterality: N/A;   TONSILLECTOMY     TOTAL HIP ARTHROPLASTY     TOTAL KNEE ARTHROPLASTY  01/22/2013   Procedure: TOTAL KNEE ARTHROPLASTY;  Surgeon: Loanne Drilling, MD;  Location: [facility];  Service: Orthopedics;  Laterality: Right;  Revision of a Right Uni Knee to a Total Knee Arthroplasty    Family History  Problem Relation Age of Onset   Heart disease Mother    Parkinson's disease Father              Social History:  reports that she has never smoked. She has never used smokeless tobacco. She reports current alcohol use. She reports that she does not use drugs.  Allergies  Allergen Reactions   Morphine And Codeine Hives   Prevacid [Lansoprazole] Other (See Comments)    unknown   Provigil [Modafinil] Other (See Comments)    dizziness   Adhesive [Tape] Rash    bandaids     MEDICATIONS:                                                                                                                     Scheduled:  amiodarone  200 mg Oral BID   aspirin  81 mg Oral Daily   colchicine  0.6 mg Oral BID   irbesartan  75 mg Oral Daily   lamoTRIgine  150 mg Oral Daily   methIMAzole  10 mg Oral Daily   metoprolol tartrate  25 mg Oral BID   montelukast  10 mg Oral QHS   Oxcarbazepine  300 mg Oral BID   pantoprazole  40 mg Oral Daily   pravastatin  40 mg Oral QAC breakfast   pyridostigmine  60 mg Oral Q8H   sertraline  50 mg Oral Daily   sodium chloride flush  3 mL Intravenous Q12H   sodium chloride flush  3 mL Intravenous Q12H   spironolactone  12.5 mg Oral Daily   Continuous:  sodium chloride     sodium chloride       ROS:                                                                                                                                        Unable to obtain due to lethargy.   Blood pressure (!) 140/67, pulse 64, temperature 97.7 F (36.5 C), temperature source Oral, resp. rate 19, height 5\' 10"  (1.778 m), weight 53.6 kg, SpO2 99%.   General Examination:  Physical Exam  HEENT-  [place]/AT    Lungs- Respirations unlabored Extremities- No edema  Neurological Examination Mental Status: In an eyes-closed, awake state. Poor verbal comprehension, but.will follow some simple commands. Speech is sparse with decreased information content and perseveration. No dysarthria. Hypophonic. Cranial Nerves: II: Blinks to threat in bilateral temporal visual fields. PERRL  III,IV, VI: No ptosis. EOMI but with saccadic quality to her visual pursuits.  V: Reacts to touch bilaterally  VII: Smile symmetric VIII: Hearing intact to voice IX,X: Hypophonic speech XI: Symmetric shoulder shrug XII: Midline tongue extension Motor: No drift with limb elevation to BUE and BLE.   Sensory: Reacts to touch x 4. No extinction to DSS.  Deep Tendon Reflexes: 1+ bilateral brachioradialis. 0 bilateral patellae.  Cerebellar: No gross ataxia noted on exam limited by poor verbal comprehension.  Gait: Unable to assess   Lab Results: Basic Metabolic Panel: Recent Labs  Lab 07/26/23 0559 07/26/23 0600 07/27/23 0146 07/28/23 0403 07/29/23 0327 07/30/23 0418 07/30/23 1225 07/30/23 1226 07/30/23 1909  NA 135  --  130* 131* 131* 131* 133* 135  --   K 3.8  --  3.5 3.7 4.3 3.8 3.2* 3.1* 3.3*  CL 100  --  98 101 102 94*  --   --   --   CO2 22  --  21* 21* 21* 24  --   --   --   GLUCOSE 92  --  101* 104* 95 103*  --   --   --   BUN 17  --  21 20 16 16   --   --   --   CREATININE 0.84  --  0.84 0.95 1.00 1.04*  --   --   --   CALCIUM 8.9  --  8.4* 8.1* 8.5* 8.9   --   --   --   MG  --  1.8 1.7 2.2 2.0 1.9  --   --   --     CBC: Recent Labs  Lab 07/25/23 0308 07/26/23 0559 07/27/23 0146 07/28/23 0403 07/29/23 0327 07/30/23 0418 07/30/23 1225 07/30/23 1226  WBC 12.7* 9.4 10.0 8.9 8.1 9.8  --   --   NEUTROABS 10.8*  --  7.1 6.1 4.8 7.0  --   --   HGB 10.6* 10.5* 10.0* 9.3* 10.0* 10.9* 9.9* 9.9*  HCT 33.1* 32.8* 30.4* 28.7* 31.3* 33.5* 29.0* 29.0*  MCV 82.8 83.5 84.0 82.5 83.2 83.3  --   --   PLT 301 283 287 282 306 263  --   --     Cardiac Enzymes: No results for input(s): "CKTOTAL", "CKMB", "CKMBINDEX", "TROPONINI" in the last 168 hours.  Lipid Panel: Recent Labs  Lab 07/26/23 0600  CHOL 144  TRIG 76  HDL 55  CHOLHDL 2.6  VLDL 15  LDLCALC 74    Imaging: CARDIAC CATHETERIZATION  Result Date: 07/30/2023 No angiographic evidence of CAD Normal right and left heart pressures (RA 7, RV 40/4/8, PA 44/12 mean 22, PCWP 11) Recommendations: No further ischemic workup.   DG Chest Port 1 View  Result Date: 07/30/2023 CLINICAL DATA:  77 year old female with history of right-sided chest tube. EXAM: PORTABLE CHEST 1 VIEW COMPARISON:  Chest x-ray 07/29/2023. FINDINGS: Small bore right-sided chest tube stable in position with tip projecting over the medial aspect of the lower right hemithorax. No appreciable right-sided pneumothorax confidently identified at this time. Trace residual right pleural effusion. Ill-defined opacity at the right lung base which may reflect atelectasis and/or  consolidation. Left lung is clear. No left pleural effusion. No left pneumothorax. No pneumothorax no evidence of pulmonary edema. Heart size is normal. Upper mediastinal contours are within normal limits. Orthopedic fixation hardware in the lower cervical spine incidentally noted. IMPRESSION: 1. Stable position of right-sided chest tube with small residual right-sided pleural effusion. 2. Increasing atelectasis and/or consolidation in the right lung base.  Electronically Signed   By: Trudie Reed M.D.   On: 07/30/2023 06:43   DG CHEST PORT 1 VIEW  Result Date: 07/29/2023 CLINICAL DATA:  Chest tube placement EXAM: PORTABLE CHEST 1 VIEW COMPARISON:  Earlier same day FINDINGS: Pigtail chest tube is been placed on the right, extending to the lower chest. Marked reduction in the amount of pleural density on the right. No visible pleural air. Better pulmonary aeration. IMPRESSION: Chest tube placed on the right. Marked reduction in the amount of pleural density on the right. Electronically Signed   By: Paulina Fusi M.D.   On: 07/29/2023 18:10   DG Chest 2 View  Result Date: 07/29/2023 CLINICAL DATA:  Shortness of breath.  Cough.  Aspiration. EXAM: CHEST - 2 VIEW COMPARISON:  07/28/2023 FINDINGS: Left chest remains clear. Moderate to large pleural effusion on the right with dependent pulmonary atelectasis/infiltrate. Allowing for technical factors, the findings appear similar to the recent films. Chronic shoulder arthropathy on the left. IMPRESSION: Moderate to large right pleural effusion with dependent pulmonary atelectasis/infiltrate on the right. Electronically Signed   By: Paulina Fusi M.D.   On: 07/29/2023 10:51     Assessment: 77 year old female with new onset of right sided weakness noted this morning while being managed on the floor for a pleural effusion following cardiac catheterization - Exam reveals no focal motor or sensory deficit. Right sided weakness initially noted by RN has resolved.  - CT head: No acute cortically based infarct or acute intracranial hemorrhage identified. Chronic white matter disease and possible new small right cerebellar infarct since last year. ASPECTS 10. - Overall presentation is most consistent with combined delirium and TIA. Possible TIA mechanisms include cardioembolic and watershed.   Recommendations: - MRI brain - Frequent neuro checks - Further recommendations pending MRI   Electronically signed: Dr.  Caryl Pina 07/31/2023, 6:54 AM

## 2023-07-31 NOTE — Progress Notes (Signed)
Night cross-coverage  Informed by RN that patient received her scheduled Klonopin around 9 PM and then became extremely drowsy.  She is now starting to wake up and talking.  Vitals signs stable.  I have discontinued Klonopin.

## 2023-07-31 NOTE — Progress Notes (Addendum)
STROKE TEAM PROGRESS NOTE   BRIEF HPI Kristen Hansen is an 77 y.o. female with a complex PMHx as outlined below including Lewy body dementia, recent admission for heart catheterization complicated by pleural effusion, who was noted by her RN this morning to have AMS with possible right sided weakness. Furthermore, pt was somnolent the night prior (attributed to pt's nightly klonopin) and continued into this morning.    SIGNIFICANT HOSPITAL EVENTS ED to hospital admission 7/31 Code stroke called 8/6 early morning  INTERIM HISTORY/SUBJECTIVE Patient is difficult to assess due baseline dementia. Spouse is at bedside. Patient does not report any concerns except being thirsty (NPO currently). Pt unable to report any focal deficits. Spouse reports that patient has been somnolent but otherwise does not having any concerns. He does ask questions about our workup from a stroke perspective. Nursing does not report any concerns and confirms that the patient is not on anticoagulation at time (per primary team was stopped due to concern for hemorrhagic stroke prior to r/o with negative CT).    OBJECTIVE  CBC    Component Value Date/Time   WBC 14.1 (H) 07/31/2023 0714   RBC 4.60 07/31/2023 0714   HGB 12.2 07/31/2023 0714   HGB 10.7 (L) 06/03/2021 1014   HCT 37.7 07/31/2023 0714   HCT 32.3 (L) 06/03/2021 1014   PLT 307 07/31/2023 0714   PLT 211 06/03/2021 1014   MCV 82.0 07/31/2023 0714   MCV 89 06/03/2021 1014   MCH 26.5 07/31/2023 0714   MCHC 32.4 07/31/2023 0714   RDW 14.2 07/31/2023 0714   RDW 12.5 06/03/2021 1014   LYMPHSABS 1.2 07/31/2023 0714   LYMPHSABS 1.3 06/03/2021 1014   MONOABS 1.1 (H) 07/31/2023 0714   EOSABS 0.1 07/31/2023 0714   EOSABS 0.1 06/03/2021 1014   BASOSABS 0.0 07/31/2023 0714   BASOSABS 0.0 06/03/2021 1014    BMET    Component Value Date/Time   NA 130 (L) 07/31/2023 0714   NA 139 06/03/2021 1014   K 4.6 07/31/2023 0714   CL 94 (L) 07/31/2023 0714   CO2 21  (L) 07/31/2023 0714   GLUCOSE 73 07/31/2023 0714   BUN 17 07/31/2023 0714   BUN 31 (H) 06/03/2021 1014   CREATININE 1.15 (H) 07/31/2023 0714   CALCIUM 8.9 07/31/2023 0714   EGFR 49 (L) 06/03/2021 1014   GFRNONAA 49 (L) 07/31/2023 0714    IMAGING past 24 hours DG CHEST PORT 1 VIEW  Result Date: 07/31/2023 CLINICAL DATA:  77 year old female with right side deficit, Code stroke. Right chest tube placed recently for right pleural effusion despite earlier thoracentesis. EXAM: PORTABLE CHEST 1 VIEW COMPARISON:  Portable chest 07/30/2023 and earlier. FINDINGS: Portable AP semi upright view at 0548 hours. Pigtail right chest tube appears stable, although difficult to exclude kinking (arrow). But veiling right lung base opacity remains improved since 07/29/2023. No superimposed pneumothorax or pulmonary edema. Mediastinal contours remain within normal limits. Left lung appears stable and negative. Partially visible ACDF. Paucity of bowel gas in the visible abdomen. IMPRESSION: Difficult to exclude kinked right pigtail chest tube (arrow), but right pleural effusion remains regressed since 07/29/2023, and no new cardiopulmonary abnormality identified. A lateral view may be valuable when feasible. Electronically Signed   By: Odessa Fleming M.D.   On: 07/31/2023 07:03   CT HEAD CODE STROKE WO CONTRAST  Result Date: 07/31/2023 CLINICAL DATA:  Code stroke. 77 year old female with right side deficit. EXAM: CT HEAD WITHOUT CONTRAST TECHNIQUE: Contiguous axial images were  obtained from the base of the skull through the vertex without intravenous contrast. RADIATION DOSE REDUCTION: This exam was performed according to the departmental dose-optimization program which includes automated exposure control, adjustment of the mA and/or kV according to patient size and/or use of iterative reconstruction technique. COMPARISON:  Brain MRI 01/24/2021.  Head CT 06/29/2022. FINDINGS: Brain: Cerebral volume appears stable and within normal  limits for age. No midline shift, ventriculomegaly, mass effect, evidence of mass lesion, intracranial hemorrhage or evidence of cortically based acute infarction. Patchy bilateral white matter hypodensity, asymmetrically involving the deep white matter capsules on the left, appear stable from the previous MRI. No convincing acute deep gray nuclei or brainstem finding. But there is a possible new small right cerebellar infarct since last year on coronal image 20. Vascular: No suspicious intracranial vascular hyperdensity. Calcified atherosclerosis at the skull base. Skull: No acute osseous abnormality identified. Sinuses/Orbits: Visualized paranasal sinuses and mastoids are stable and well aerated. Other: No gaze deviation. Visualized orbits and scalp soft tissues are within normal limits. ASPECTS [facility] Stroke Program Early CT Score) Total score (0-10 with 10 being normal): 10 IMPRESSION: 1. No acute cortically based infarct or acute intracranial hemorrhage identified. Chronic white matter disease and possible new small right cerebellar infarct since last year. 2.  ASPECTS 10. 3. These results were communicated to Dr. Otelia Limes at 6:57 am on 07/31/2023 by text [place] via the [facility] messaging system. Electronically Signed   By: Odessa Fleming M.D.   On: 07/31/2023 06:58    Vitals:   07/31/23 1610 07/31/23 0957 07/31/23 1026 07/31/23 1317  BP: (!) 145/68 (!) 147/58 (!) 148/60 135/66  Pulse: 73 71 74   Resp: 20 19 18    Temp:    98 F (36.7 C)  TempSrc:    Axillary  SpO2: 97% 96% 98%   Weight:      Height:         PHYSICAL EXAM General:  Alert, well-nourished, well-developed patient in no acute distress Psych:  Mood and affect appropriate for situation CV: Regular rate and rhythm on monitor Respiratory:  Regular, unlabored respirations on room air GI: Abdomen soft and nontender   NEURO:  Mental Status: AA&O to person and place but not time, patient is not able to give clear and coherent history. Poor  attention but may be baseline. Unable to assess memory due to inattention.  Speech/Language: speech is without dysarthria or aphasia.  Naming, repetition, fluency, and comprehension intact.  Cranial Nerves:  II: PERRL. Visual fields full.  III, IV, VI: EOMI. Eyelids elevate symmetrically.  V: Sensation is intact to light touch and symmetrical to face.  VII: Face is symmetrical resting and smiling VIII: hearing intact to voice. IX, X: Palate elevates symmetrically. Phonation is normal.  RU:[id number] shrug 5/5. XII: tongue is midline without fasciculations. Motor: 5/5 strength to all muscle groups tested. Asterixis of right hand when arms outstretched.  Tone: is normal and bulk is normal Sensation- Intact to light touch bilaterally.    Coordination: FTN intact bilaterally, could not assess HTS.   Gait- deferred   ASSESSMENT/PLAN  Pt is not fully oriented and has poor attention, although she has LBD at baseline. No focal neurologic deficits, except some asterixis of right hand noted since initial finding. Any worsening of attention and mental status past baseline is likely attributed to delirium but Ddx could also include unspecified encephalopathy.  MRI and EEG pending.    Likely encephalopathy, less likely seizure or stroke/TIA Code Stroke CT head:  No acute abnormality. Chronic white matter disease and small vessel disease. New finding of cerebellar lesion likely from past year. ASPECTS 10.    CTA head and neck pending MRI  pending EEG pending LDL 74 HgbA1c 5.2 VTE prophylaxis - SCD No antithrombotic prior to admission, now on heparin IV, transition to DOAC once appropriate. Therapy recommendations:  Home Health PT and Home Health OT Disposition:  pending  Paroxysmal Atrial fibrillation New diagnosis Continue telemetry monitoring now on heparin IV, transition to DOAC once appropriate. Cardiology on board Cardio cath 8/5 unremarkable   Hypertension Home meds:   amLODipine-olmesartan 5-40 mg Unstable, labile BP goal of normotensive   Hyperlipidemia Home meds:  pravastatin 40, resumed in hospital LDL 74, goal < 70 No high intensity statin due to LDL not far from goal Continue statin at discharge  Dysphagia Patient has post-stroke dysphagia SLP consulted Currently NPO Advance diet as tolerated On IVF  Other Stroke Risk Factors ETOH use, advised to drink no more than 1 drink a day Congestive heart failure  Other Active Problems Pleural effusion, CCM on board, s/p thoracentesis x 2 Leukocytosis WBC 9.8-14.1, UA pending Hyponatremia sodium 130   Hospital day # 6   Meryl Dare, MD PGY-1 Psychiatry Resident 07/31/2023, 1:50 PM  ATTENDING NOTE: I reviewed above note and agree with the assessment and plan. Pt was seen and examined.   Husband at bedside.  Patient lying bed, eyes open, awake alert, much improved from overnight and this morning.  She has dysarthria, orientated to people and places, however not oriented to time.  Limited language output but follows most simple commands.  Patient did not name or repeat.  No focal neurologic deficit, however bilateral upper extremity mild asterixis.  Etiology of patient's symptoms overnight and this morning likely metabolic encephalopathy from multiple medical issues.  However, will recommend MRI, CT head and neck and EEG to rule out stroke or seizure.  Continue heparin IV for PAF and continue statin.  Treat underlying medical issues, encephalopathy management per primary team.  Will follow.  For detailed assessment and plan, please refer to above/below as I have made changes wherever appropriate.   Marvel Plan, MD PhD Stroke Neurology 07/31/2023 4:34 PM    To contact Stroke Continuity provider, please refer to [email or web address]. After hours, contact General Neurology

## 2023-07-31 NOTE — TOC Benefit Eligibility Note (Signed)
Pharmacy Patient Advocate Encounter  Insurance verification completed.    The patient is insured through [facility] Medicare Part D  Ran test claim for Eliquis 5 mg and the current 30 day co-pay is $40.00.  Ran test claim for Xarelto 20 mg and the current 30 day co-pay is $40.00.   This test claim was processed through [facility]- copay amounts may vary at other pharmacies due to pharmacy/plan contracts, or as the patient moves through the different stages of their insurance plan.    Roland Earl, CPHT Pharmacy Patient Advocate Specialist [facility] Patient Advocate Team Direct Number: [phone]  Fax: [phone]

## 2023-07-31 NOTE — Code Documentation (Signed)
Stroke Response Nurse Documentation Code Documentation  Kristen Hansen is a 77 y.o. female a Code stroke was activated by nursing staff for right facial droop and right sided facial numbness. LKW 2300 8/5.   Stroke team at the bedside. Patient to CT with team. NIHSS 3, see documentation for details and code stroke times. Patient with decreased LOC and disoriented on exam. The following imaging was completed:  CT Head. Patient is not a candidate for IV Thrombolytic due to outside of window. Patient is not a candidate for IR due to low suspicion for LVO.   Care Plan: Neuro checks q2   Bedside handoff with [facility].    Rose Fillers  Rapid Response RN

## 2023-07-31 NOTE — Care Management Important Message (Signed)
Important Message  Patient Details  Name: Kristen Hansen MRN: [id number] Date of Birth: 09/19/1946   Medicare Important Message Given:  Yes     Renie Ora 07/31/2023, 9:01 AM

## 2023-07-31 NOTE — Progress Notes (Signed)
EEG complete - results pending 

## 2023-07-31 NOTE — Progress Notes (Signed)
   NAME:  Kristen Hansen, MRN:  [id number], DOB:  11-Oct-1946, LOS: 6 ADMISSION DATE:  07/25/2023, CONSULTATION DATE: 07/27/2023 REFERRING MD: Dr. Thedore Mins, CHIEF COMPLAINT: Pleural effusion  History of Present Illness:  Asked to see for recurrent pleural effusion Recently had a thoracenteses for 1.1 L of exudative fluid  Denies any significant complaints at present  Presented to the hospital with chest pain, shortness of breath with activity  Pertinent  Medical History   Past Medical History:  Diagnosis Date   Back pain    Cancer (HCC)    Skin, basal cell   Dyspnea    Fatigue    GERD (gastroesophageal reflux disease)    Headache(784.0)    history of migraines   History of kidney stones    Hypercholesteremia    Hypertension    Leg swelling    Bilateral   Occasional tremors    mild hand tremors   Osteoarthritis    PONV (postoperative nausea and vomiting)    RBD (REM behavioral disorder)    Rheumatoid arthritis(714.0)    Scoliosis of lumbar spine    Spinal stenosis    Multiple levels   Tic [place]    Trigeminal neuralgia 03/30/2015   Vision abnormalities    Significant Hospital Events: Including procedures, antibiotic start and stop dates in addition to other pertinent events   8/1 thoracentesis 1.3 L 8/4 chest tube placement> 1.35L drained that shift  Interim History / Subjective:  Status post heart cath 07/30/2023, s/p code stroke 07/30/2023  Objective   Blood pressure (!) 148/60, pulse 74, temperature 99.4 F (37.4 C), temperature source Axillary, resp. rate 18, height 5\' 10"  (1.778 m), weight 53.6 kg, SpO2 98%.        Intake/Output Summary (Last 24 hours) at 07/31/2023 1155 Last data filed at 07/31/2023 1100 Gross per 24 hour  Intake 543.75 ml  Output 335 ml  Net 208.75 ml   Filed Weights   07/25/23 0251 07/27/23 0110 07/31/23 0403  Weight: 56.2 kg 55.8 kg 53.6 kg    Examination: Frail elderly female who appears somewhat stunned No JVD or lymphadenopathy is  appreciated Decreased breath sounds in the bases right chest tube is not on suction at this time without airleak Heart sounds are regular she has a systolic ejection murmur Soft nontender abdomen No edema lower extremities Neurologically stunned and difficult to communicate communicate with     Resolved Hospital Problem list     Assessment & Plan:  Right sided pleural effusion, weakly exudative - differential diagnosis serositis due to pleuropericarditis, aspiration related reactive effusion. Heart failure with diuresis is most likely with such low cellularity. Normal glucose suggests not RA-related.   Cytology is negative for malignancy Chest tube was not to suction suction was placed at 20 cm Catheterization was normal Questionable CT scan of the chest but just had a cardiac cath therefore concern with dilated     Acute on chronic HFpEF, new RWMA on echo Per cardiology  Atrial fibrillation, new onset Per cardiology  Pericarditis- autoimmune? Continue colchicine  History of Sjogren's syndrome & rheumatoid arthritis Continue current medication  Status post code stroke 07/30/2023 CT was negative MRI is pending Suspect is related to sedation from cardiac catheterization Neurology is following  Brett Canales  ACNP Acute Care Nurse Practitioner [facility] Pulmonary/Critical Care Please consult Amion 07/31/2023, 11:55 AM k.

## 2023-07-31 NOTE — Progress Notes (Signed)
Spoke with RN to coordinate a god time to come up for EEG. Waiting to see if MRI needs to go first. Will try back as schedule permits

## 2023-07-31 NOTE — Progress Notes (Signed)
Speech Language Pathology Treatment: Dysphagia  Patient Details Name: SUHAILAH CRITES MRN: [id number] DOB: 07/28/1946 Today's Date: 07/31/2023 Time: 1705-1730 SLP Time Calculation (min) (ACUTE ONLY): 25 min  Assessment / Plan / Recommendation Clinical Impression  Patient seen by SLP for skilled treatment focused on dysphagia goals. She has been NPO today secondary to concerns for acute CVA. When SLP arrived a little after 5pm, patient had just gotten back from MRI. MRI was negative for acute intracranial abnormality. Patient was awake but drowsy and fatigued overall. She was receptive to some PO's of thin liquids (cola) and puree solids (chocolate pudding). She exhibited immediate cough with some swallows of thin liquids but this did not occur with each swallow. In addition, recent MBS confirmed that patient continues to be aspirating with thin liquids. SLP does not suspect that patient has had a significant change in her swallow function today as compared to previous couple days. Her spouse continues to state that he and patient do not wish to use thickener as it results in patient not wanting to drink the liquids. SLP will continue to follow for dysphagia goals. Recommend returning to Dys 3 (mechanical soft) solids, thin liquids diet.   HPI HPI: Patient is a 77 y.o. female with PMH: Lewy body dementia, HTN, HLD, Sjogren's syndrome, RA, chronic pain, DVT, spinal stenosis, diastolic CHF, depression, REM behavior disorder, orthostatic hypotension, CKD 3, GERD, hypothyroidism. She presented to the hospital on 06/27/23 with chest pain.  CT PE study showed no PE but did show a large right pleural effusion with compressive atelectasis.  ardiology was consulted in ED and had low suspicion for MI based on EKG review. CXR showed Moderate right pleural effusion, increased since the prior  radiograph. She had MBS completed at [facility] on 05/15/2023 which reported decline of swallow function since 2022 MBS  consisting of "mistiming of the pharyngeal swallow, decreased bolus propulsion (base of tongue retraction), incomplete laryngeal vestibule closure, and diminished laryngeal pharyngeal sensation. Deficits result in mild post-swallow residual in the valleculae; penetration of mildly-thick liquid, and puree; and penetration AND aspiration of thin liquid." She was exhibiting stroke like symptoms on 8/6 and was made NPO. MRI negative for acute intracranial changes.      SLP Plan  Continue with current plan of care      Recommendations for follow up therapy are one component of a multi-disciplinary discharge planning process, led by the attending physician.  Recommendations may be updated based on patient status, additional functional criteria and insurance authorization.    Recommendations  Diet recommendations: Dysphagia 3 (mechanical soft);Other(comment) (patient and family continue to decline thickened liquids) Liquids provided via: Straw;Cup Medication Administration: Whole meds with puree Supervision: Patient able to self feed;Staff to assist with self feeding;Full supervision/cueing for compensatory strategies Compensations: Small sips/bites;Slow rate Postural Changes and/or Swallow Maneuvers: Seated upright 90 degrees                  Oral care BID   Frequent or constant Supervision/Assistance Dysphagia, oropharyngeal phase (R13.12)     Continue with current plan of care     Angela Nevin, MA, CCC-SLP Speech Therapy

## 2023-07-31 NOTE — Progress Notes (Addendum)
At 23:15 patient very drowsy and unable to stay awake to drink PO potassium. Press photographer and provider Loney Loh) contacted. Patient eventually able to take small sips of potassium and answer some questions. Patient refused to drink the remaining third of the potassium. Provider discontinued the patient's klonopin dose. Patient VSS.  At 06:00 patient unable to completely open right eye and could not feel pressure on the the right side of her face. Provider Loney Loh) contacted, rapid called, and stroke code called. Along with the rapid nurse, transported patient to CT. Patient back in bed and stroke code orders implemented.

## 2023-07-31 NOTE — TOC Initial Note (Signed)
Transition of Care [facility]) - Initial/Assessment Note    Patient Details  Name: Kristen Hansen MRN: [id number] Date of Birth: [birth year]  Transition of Care [facility]) CM/SW Contact:    Gala Lewandowsky, RN Phone Number: 07/31/2023, 4:24 PM  Clinical Narrative:  Patient presented as a transfer from [facility]. Patient presented for pleural effusions-CT's present. Post R/LHC. PTA patient was from home with spouse. Spouse at the bedside during the visit. Patient is down for MRI. Case Manager will continue to follow for transition of care needs as the patient progresses.                   Expected Discharge Plan: Home w Home Health Services Barriers to Discharge: Continued Medical Work up  Patient Goals and CMS Choice     Choice offered to / list presented to : NA    Expected Discharge Plan and Services In-house Referral: NA Discharge Planning Services: CM Consult   Living arrangements for the past 2 months: Single Family Home                   DME Agency: NA  Prior Living Arrangements/Services Living arrangements for the past 2 months: Single Family Home Lives with:: Spouse Patient language and need for interpreter reviewed:: Yes        Need for Family Participation in Patient Care: Yes (Comment)     Criminal Activity/Legal Involvement Pertinent to Current Situation/Hospitalization: No - Comment as needed  Activities of Daily Living Home Assistive Devices/Equipment: Walker (specify type) ADL Screening (condition at time of admission) Patient's cognitive ability adequate to safely complete daily activities?: Yes Is the patient deaf or have difficulty hearing?: No Does the patient have difficulty seeing, even when wearing glasses/contacts?: No Does the patient have difficulty concentrating, remembering, or making decisions?: No Patient able to express need for assistance with ADLs?: Yes Does the patient have difficulty dressing or bathing?: No Independently performs ADLs?: Yes  (appropriate for developmental age) Does the patient have difficulty walking or climbing stairs?: No Weakness of Legs: None Weakness of Arms/Hands: None  Permission Sought/Granted Permission sought to share information with : Family Supports, Case Manager   Alcohol / Substance Use: Not Applicable Psych Involvement: No (comment)  Admission diagnosis:  Pleural effusion [J90] Abnormal EKG [R94.31] Chest pain, unspecified type [R07.9] Patient Active Problem List   Diagnosis Date Noted   PAF (paroxysmal atrial fibrillation) (HCC) 07/30/2023   Need for management of chest tube 07/30/2023   Acute idiopathic pericarditis 07/26/2023   Pleural effusion 07/25/2023   Chest pain 07/25/2023   Sjogren's syndrome (HCC) 07/25/2023   Hyperthyroidism 06/07/2023   Lewy body dementia without behavioral disturbance, psychotic disturbance, mood disturbance, or anxiety (HCC) 06/08/2022   Spinal stenosis of lumbar region 06/08/2022   Gastroesophageal reflux disease with esophagitis 04/19/2022   Speech disturbance 12/06/2021   Depression 12/06/2021   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) 09/22/2021   Mixed hyperlipidemia 08/23/2021   Other peripheral vertigo, unspecified ear 01/20/2021   Chronic heart failure with preserved ejection fraction (HCC) 11/08/2020   History of fusion of cervical spine 09/27/2020   Transient alteration of awareness 09/27/2020   Orthostatic hypotension 09/27/2020   Cervical radiculopathy 01/06/2020   Lumbar radiculopathy 01/06/2020   CKD (chronic kidney disease) stage 3, GFR 30-59 ml/min (HCC) 07/21/2019   Numbness 05/26/2019   Chronic bilateral low back pain without sciatica 09/05/2018   Arthrofibrosis of knee joint, right 02/27/2018   Facet syndrome 04/30/2017  Excessive sleepiness 10/31/2016   Avitaminosis D 03/30/2016   Idiopathic peripheral neuropathy 02/23/2016   HTN (hypertension) 02/22/2016   Other fatigue 10/20/2015   Trigeminal neuralgia  03/30/2015   REM behavioral disorder 03/30/2015   Polypharmacy 09/11/2014   Other long term (current) drug therapy 09/11/2014   Arthritis or polyarthritis, rheumatoid (HCC) 05/14/2014   Generalized OA 05/14/2014   Rheumatoid arthritis (HCC) 05/14/2014   Postop Hyponatremia 01/23/2013   Postoperative anemia due to acute blood loss 01/23/2013   Pain due to unicompartmental arthroplasty of knee (HCC) 01/22/2013   Pain due to knee joint prosthesis (HCC) 01/22/2013   Mucositis oral 08/20/2012   PCP:  Kerin Salen, PA-C Pharmacy:   [facility] [address], [place] - [address] [place] [address] Phone: [phone] Fax: [phone]  Social Determinants of Health (SDOH) Social History: SDOH Screenings   Food Insecurity: No Food Insecurity (07/25/2023)  Housing: Low Risk  (07/25/2023)  Transportation Needs: No Transportation Needs (07/25/2023)  Utilities: Not At Risk (07/25/2023)  Financial Resource Strain: Low Risk  (08/22/2022)   [facility] visits prior to 02/24/2023.  Physical Activity: Inactive (08/22/2022)   [facility], [facility] prior to 02/24/2023., [facility] visits prior to 02/24/2023.  Social Connections: Socially Integrated (08/22/2022)   [facility], [facility] visits prior to 02/24/2023., [facility] visits prior to 02/24/2023.  Stress: No Stress Concern Present (08/22/2022)   [facility], [facility] prior to 02/24/2023., [facility] visits prior to 02/24/2023.  Tobacco Use: Low Risk  (07/25/2023)   SDOH Interventions:     Readmission Risk Interventions     No data to  display

## 2023-07-31 NOTE — Progress Notes (Signed)
SLP Cancellation Note  Patient Details Name: Kristen Hansen MRN: [id number] DOB: March 25, 1946   Cancelled treatment:       Reason Eval/Treat Not Completed: Patient at procedure or test/unavailable;Other (comment) SLP will f/u later this date or next date.  Angela Nevin, MA, CCC-SLP Speech Therapy

## 2023-07-31 NOTE — Progress Notes (Signed)
ANTICOAGULATION CONSULT NOTE  Pharmacy Consult for Heparin Indication: atrial fibrillation  Allergies  Allergen Reactions   Morphine And Codeine Hives   Prevacid [Lansoprazole] Other (See Comments)    unknown   Provigil [Modafinil] Other (See Comments)    dizziness   Adhesive [Tape] Rash    bandaids     Patient Measurements: Height: 5\' 10"  (177.8 cm) Weight: 53.6 kg (118 lb 2.7 oz) IBW/kg (Calculated) : 68.5 Heparin Dosing Weight: 56 kg  Vital Signs: Temp: 99.4 F (37.4 C) (08/06 0809) Temp Source: Axillary (08/06 0809) BP: 148/60 (08/06 1026) Pulse Rate: 74 (08/06 1026)  Labs: Recent Labs    07/28/23 2220 07/29/23 0327 07/29/23 0327 07/30/23 0418 07/30/23 1225 07/30/23 1226 07/31/23 0714  HGB  --  10.0*   < > 10.9* 9.9* 9.9* 12.2  HCT  --  31.3*   < > 33.5* 29.0* 29.0* 37.7  PLT  --  306  --  263  --   --  307  HEPARINUNFRC 0.42 0.51  --  0.49  --   --   --   CREATININE  --  1.00  --  1.04*  --   --  1.15*   < > = values in this interval not displayed.    Estimated Creatinine Clearance: 35.2 mL/min (A) (by C-G formula based on SCr of 1.15 mg/dL (H)).  Assessment: Patient is a 2 yof admitted for chest pain and new onset afib. Patient found to have pericardial effusion/pericarditis. She had thoracentesis 8/1 with 1.3L removed and now s/p cath 8/5. Code stroke called 8/5 and CT negative with MRI planned -heparin lvel was 0.49 on 1300 units/hr on 8/5   Goal of Therapy:  Heparin leve= 0.3-0.5 Monitor platelets by anticoagulation protocol: Yes   Plan:   -Restart heparin drip at 1300 units/hr -Heparin level in 8 hours and daily wth CBC daily  Harland German, PharmD Clinical Pharmacist **Pharmacist phone directory can now be found on [email or web address] ([email or web address]).  Listed under [facility].     Thuy D. Laney Potash, PharmD, BCPS, BCCCP 07/31/2023, 12:41 PM

## 2023-08-01 DIAGNOSIS — J90 Pleural effusion, not elsewhere classified: Secondary | ICD-10-CM | POA: Diagnosis not present

## 2023-08-01 DIAGNOSIS — I300 Acute nonspecific idiopathic pericarditis: Secondary | ICD-10-CM | POA: Diagnosis not present

## 2023-08-01 DIAGNOSIS — I5032 Chronic diastolic (congestive) heart failure: Secondary | ICD-10-CM | POA: Diagnosis not present

## 2023-08-01 DIAGNOSIS — G9341 Metabolic encephalopathy: Secondary | ICD-10-CM | POA: Diagnosis not present

## 2023-08-01 DIAGNOSIS — R569 Unspecified convulsions: Secondary | ICD-10-CM | POA: Diagnosis not present

## 2023-08-01 LAB — BODY FLUID CELL COUNT WITH DIFFERENTIAL
Eos, Fluid: 10 %
Lymphs, Fluid: 11 %
Monocyte-Macrophage-Serous Fluid: 2 % — ABNORMAL LOW (ref 50–90)
Neutrophil Count, Fluid: 77 % — ABNORMAL HIGH (ref 0–25)
Total Nucleated Cell Count, Fluid: 447 cu mm (ref 0–1000)

## 2023-08-01 LAB — BODY FLUID CULTURE W GRAM STAIN: Gram Stain: NONE SEEN

## 2023-08-01 LAB — HEPARIN LEVEL (UNFRACTIONATED): Heparin Unfractionated: 0.48 IU/mL (ref 0.30–0.70)

## 2023-08-01 MED ORDER — IRBESARTAN 150 MG PO TABS
150.0000 mg | ORAL_TABLET | Freq: Every day | ORAL | Status: DC
  Administered 2023-08-01 – 2023-08-08 (×8): 150 mg via ORAL
  Filled 2023-08-01 (×8): qty 1

## 2023-08-01 MED ORDER — PIPERACILLIN-TAZOBACTAM 3.375 G IVPB
3.3750 g | Freq: Three times a day (TID) | INTRAVENOUS | Status: DC
  Administered 2023-08-01 – 2023-08-02 (×3): 3.375 g via INTRAVENOUS
  Filled 2023-08-01 (×4): qty 50

## 2023-08-01 MED ORDER — MAGNESIUM SULFATE 2 GM/50ML IV SOLN
2.0000 g | Freq: Once | INTRAVENOUS | Status: AC
  Administered 2023-08-01: 2 g via INTRAVENOUS
  Filled 2023-08-01: qty 50

## 2023-08-01 MED ORDER — PIPERACILLIN-TAZOBACTAM 3.375 G IVPB 30 MIN
3.3750 g | Freq: Once | INTRAVENOUS | Status: AC
  Administered 2023-08-01: 3.375 g via INTRAVENOUS
  Filled 2023-08-01: qty 50

## 2023-08-01 MED ORDER — FUROSEMIDE 20 MG PO TABS
20.0000 mg | ORAL_TABLET | Freq: Every day | ORAL | Status: DC
  Administered 2023-08-01: 20 mg via ORAL
  Filled 2023-08-01: qty 1

## 2023-08-01 MED ORDER — POTASSIUM CHLORIDE CRYS ER 20 MEQ PO TBCR
40.0000 meq | EXTENDED_RELEASE_TABLET | Freq: Once | ORAL | Status: AC
  Administered 2023-08-01: 40 meq via ORAL
  Filled 2023-08-01: qty 2

## 2023-08-01 NOTE — Progress Notes (Signed)
PROGRESS NOTE    Kristen Hansen  ZOX:[id number] DOB: [birth year] DOA: 07/25/2023 PCP: Kerin Salen, PA-C   Brief Narrative:  Kristen Hansen is a 77 y.o. female with medical history significant of hypertension, hyperlipidemia, Lewy body dementia, Sjogren's syndrome, rheumatoid arthritis, chronic pain, DVT, spinal stenosis, diastolic CHF, depression, REM behavior disorder, orthostatic hypotension, CKD 3, GERD, hyperthyroidism presenting with chest pain. Her chest pain has atypical nature and present for 2 weeks intermittently. She was found to have a moderate pleural effusion. Cardiology was consulted and they are planning a catheterization given her cardiac history.   Assessment & Plan:   Principal Problem:   Pleural effusion Active Problems:   Rheumatoid arthritis (HCC)   HTN (hypertension)   Chronic bilateral low back pain without sciatica   Depression   Lewy body dementia without behavioral disturbance, psychotic disturbance, mood disturbance, or anxiety (HCC)   Spinal stenosis of lumbar region   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)   Chronic heart failure with preserved ejection fraction (HCC)   CKD (chronic kidney disease) stage 3, GFR 30-59 ml/min (HCC)   Gastroesophageal reflux disease with esophagitis   Mixed hyperlipidemia   Chest pain   Sjogren's syndrome (HCC)   Acute idiopathic pericarditis   PAF (paroxysmal atrial fibrillation) (HCC)   Need for management of chest tube   Chest Pain, exudative and recurrent right-sided pleural Effusion, Pericardial Effusion - Chest pain improved after thoracentesis. Given re-accumulation of fluid and exudative nature, PCCM was consulted. Appreciate their assistance. They placed a chest tube which had additional output of 1.4 L. Will monitor output of her effusion. Pt at risk for exudative effusion given her hx of autoimmune diseases. Per PCCM, her effusion might be a pseudo-exudative given diuretic use.   She appears to have had only 135 cc output from chest tube in last 24 hours.  Pulmonary managing.  At discharge, will have her follow-up with pulmonary outpatient along with rheumatology.   Acute on chronic diastolic CHF EF 50 to 55%, and some preceding history of exertional shortness of breath.   Pt had episode of worsening dyspnea on morning of 08/04 with coughing, CXR obtained showed volume overload. She was given IV lasix with improvement in her symptoms.  Cardiology on board.  Underwent cath which showed clean coronaries.   Pericardial Effusion She has mild pericarditis and is currently on colchicine for pericardial effusion along with aspirin per cardiology.   Paroxysmal Atrial Fibrillation: Cardiology following, rates controlled, on amiodarone and metoprolol oral as well as heparin drip, will switch to DOAC once chest tube is out.  Dysphagia: OP work up ongoing. EGD without any acute findings. Getting swallowing therapy. SLP following pt. Repeat MBBS shows pt has some retention of contrast and some aspiration concern but overall unchanged from prior study and likely secondary to progressing lewy body dementia. Placed on Dys 3 Diet here. Will need follow up with palliative care OP (see below).    Lewy Body Dementia: On pyridostigmine and clonzapem, Will continue here. Last saw neurologist one month ago. Appears to be progressing and pt may benefit from palliative care consult (see below).   Acute encephalopathy/delirium: Patient was noted to be confused yesterday.  Currently she is fully alert and oriented to person place and year but not month.  Husband at the bedside verifies that this is her baseline.   GOC: Pt with lewey body dementia and multiple co-morbidities and worsening dysphagia. She may benefit from OP palliative care consult.  Orthostatic Hypotension: Pt with orthostatic hypotension. Suspect this is secondary to her lewy body dementia.  Continue TED hose.  Would encourage her  to change positions slowly. Per husband, she has some dizziness at home.    Hyponatremia: Na 132, stable.    Hypomagnesemia/Hypokalemia: Magnesium and potassium low, will replace both of them.   Hx of DVT: D dimer elevated,  CTA negative, lower extremity doppler which was negative for DVT.  Now on heparin drip for A-fib.     Hyperthyroidism: Recently started on methimazole. States she is complaint with this. Will continue here. FT4 normal and FT3 slightly decreased at 1.7. Thyroid ultrasound ordered and shows bilateral nodules that do not meet criteria for biopsy for dedicated follow up.  Will need OP endocrinology follow up.    Left Shoulder Pain: Has good PROM but pain with active ROM. Concern for RT injury. 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 Conservative measures for now.  Overall better, outpatient orthopedics follow-up postdischarge. PCP to arrange.   HTN:  Home meds: amlodipine 5 mg every day, irbesartan 300 mg every day, lopressor 25 mg BID.  Patient currently on Aldactone, irbesartan, metoprolol.  Blood pressure fairly controlled.   GERD: Continue home med protonix. Does not endorse symptoms of reflux.    Sjogren's Syndrome: Not on any pharmacotherapy.  Likely causing pericardial effusion and exudative right-sided pleural effusion, currently on colchicine for pericardial effusion may require outpatient rheumatology follow-up postdischarge.   RA: Was on enbrel. Last used 2 months ago.    Chronic back pain: Tylenol prn   MDD: Zoloft 50 mg every day. Continued here.    Trigeminal Neuralgia: On lamictal and trileptal. Both continued here.  Code stroke/ ? TIA: Patient was noted to have right-sided weakness by RN at around 6:30 AM. LKN was at about 9:00 PM last night when RN administered her meds. The patient was drowsy and poorly cooperative at that time, which  were attributed to a recent Klonopin dose .  Code stroke was called, seen by neurology.  Underwent CT head, CTA head and neck and MRI brain and was ruled out of stroke.  EEG negative as well.  Patient back at her baseline mental status.   Obesity: Estimated body mass index is 17.65 kg/m as calculated from the following:   Height as of this encounter: 5\' 10"  (1.778 m).   Weight as of this encounter: 55.8 kg.   DVT prophylaxis: Place TED hose Start: 07/27/23 1141   Code Status: Full Code  Family Communication: Husband present at bedside.   Status is: Inpatient Remains inpatient appropriate because: Patient with chest tube   Estimated body mass index is 16.61 kg/m as calculated from the following:   Height as of this encounter: 5\' 10"  (1.778 m).   Weight as of this encounter: 52.5 kg.  Pressure Injury 08/01/23 Sacrum Medial Deep Tissue Pressure Injury - Purple or maroon localized area of discolored intact skin or blood-filled blister due to damage of underlying soft tissue from pressure and/or shear. (Active)  08/01/23 0000  Location: Sacrum  Location Orientation: Medial  Staging: Deep Tissue Pressure Injury - Purple or maroon localized area of discolored intact skin or blood-filled blister due to damage of underlying soft tissue from pressure and/or shear.  Wound Description (Comments):   Present on Admission:   Dressing Type Foam - Lift dressing to assess site every shift 08/01/23  0000   Nutritional Assessment: Body mass index is 16.61 kg/m.Marland Kitchen Seen by dietician.  I agree with the assessment and plan as outlined below: Nutrition Status:        . Skin Assessment: I have examined the patient's skin and I agree with the wound assessment as performed by the wound care RN as outlined below: Pressure Injury 08/01/23 Sacrum Medial Deep Tissue Pressure Injury - Purple or maroon localized area of discolored intact skin or blood-filled blister due to damage of underlying soft tissue from  pressure and/or shear. (Active)  08/01/23 0000  Location: Sacrum  Location Orientation: Medial  Staging: Deep Tissue Pressure Injury - Purple or maroon localized area of discolored intact skin or blood-filled blister due to damage of underlying soft tissue from pressure and/or shear.  Wound Description (Comments):   Present on Admission:   Dressing Type Foam - Lift dressing to assess site every shift 08/01/23 0000    Consultants:  Neurology, critical care and cardiology  Procedures:  As above  Antimicrobials:  Anti-infectives (From admission, onward)    Start     Dose/Rate Route Frequency Ordered Stop   08/01/23 1600  piperacillin-tazobactam (ZOSYN) IVPB 3.375 g        3.375 g 12.5 mL/hr over 240 Minutes Intravenous Every 8 hours 08/01/23 0843     08/01/23 0930  piperacillin-tazobactam (ZOSYN) IVPB 3.375 g        3.375 g 100 mL/hr over 30 Minutes Intravenous  Once 08/01/23 0843     07/26/23 0540  valACYclovir (VALTREX) tablet 1,000 mg        1,000 mg Oral Every 12 hours PRN 07/26/23 0541           Subjective: Seen and examined.  Much more alert than yesterday.  Has no complaints.  Husband at the bedside.  Objective: Vitals:   07/31/23 1717 07/31/23 2141 08/01/23 0422 08/01/23 0817  BP: (!) 158/68 (!) 133/58 (!) 155/81 (!) 162/76  Pulse: 70 69 61 64  Resp: 14   16  Temp:   (!) 97.4 F (36.3 C) (!) 97.5 F (36.4 C)  TempSrc: Oral  Axillary Oral  SpO2: 98%  92% 97%  Weight:   52.5 kg   Height:        Intake/Output Summary (Last 24 hours) at 08/01/2023 1004 Last data filed at 08/01/2023 0429 Gross per 24 hour  Intake 1536.81 ml  Output 450 ml  Net 1086.81 ml   Filed Weights   07/27/23 0110 07/31/23 0403 08/01/23 0422  Weight: 55.8 kg 53.6 kg 52.5 kg    Examination:  General exam: Appears calm and comfortable  Respiratory system: Clear to auscultation. Respiratory effort normal.  Has chest tube connected to the right chest. Cardiovascular system: S1 & S2  heard, RRR. No JVD, murmurs, rubs, gallops or clicks. No pedal edema. Gastrointestinal system: Abdomen is nondistended, soft and nontender. No organomegaly or masses felt. Normal bowel sounds heard. Central nervous system: Alert and oriented x 2. No focal neurological deficits. Extremities: Symmetric 5 x 5 power. Skin: No rashes, lesions or ulcers.  Psychiatry: Judgement and insight appear poor  Data Reviewed: I have personally reviewed following labs and imaging studies  CBC: Recent Labs  Lab 07/28/23 0403 07/29/23 0327 07/30/23 0418 07/30/23 1225 07/30/23 1226 07/31/23 0714 08/01/23 0638  WBC 8.9 8.1 9.8  --   --  14.1* 6.7  NEUTROABS 6.1 4.8 7.0  --   --  11.5* 4.8  HGB 9.3* 10.0* 10.9* 9.9* 9.9* 12.2  9.8*  HCT 28.7* 31.3* 33.5* 29.0* 29.0* 37.7 30.9*  MCV 82.5 83.2 83.3  --   --  82.0 82.6  PLT 282 306 263  --   --  307 266   Basic Metabolic Panel: Recent Labs  Lab 07/28/23 0403 07/29/23 0327 07/30/23 0418 07/30/23 1225 07/30/23 1226 07/30/23 1909 07/31/23 0714 08/01/23 0638  NA 131* 131* 131* 133* 135  --  130* 132*  K 3.7 4.3 3.8 3.2* 3.1* 3.3* 4.6 3.4*  CL 101 102 94*  --   --   --  94* 100  CO2 21* 21* 24  --   --   --  21* 19*  GLUCOSE 104* 95 103*  --   --   --  73 71  BUN 20 16 16   --   --   --  17 13  CREATININE 0.95 1.00 1.04*  --   --   --  1.15* 0.88  CALCIUM 8.1* 8.5* 8.9  --   --   --  8.9 8.1*  MG 2.2 2.0 1.9  --   --   --  1.7 1.6*   GFR: Estimated Creatinine Clearance: 45.1 mL/min (by C-G formula based on SCr of 0.88 mg/dL). Liver Function Tests: Recent Labs  Lab 07/28/23 0403 07/29/23 0327 07/30/23 0418 07/31/23 0714 08/01/23 0638  AST 8* 18 24 43* 26  ALT 12 14 17 27 22   ALKPHOS 108 121 143* 164* 141*  BILITOT <0.1* 0.6 0.5 0.4 0.5  PROT 5.0* 5.6* 6.0* 6.1* 4.9*  ALBUMIN 1.9* 2.2* 2.4* 2.5* 1.9*   No results for input(s): "LIPASE", "AMYLASE" in the last 168 hours. Recent Labs  Lab 07/31/23 1347  AMMONIA 21   Coagulation  Profile: No results for input(s): "INR", "PROTIME" in the last 168 hours.  Cardiac Enzymes: No results for input(s): "CKTOTAL", "CKMB", "CKMBINDEX", "TROPONINI" in the last 168 hours. BNP (last 3 results) No results for input(s): "PROBNP" in the last 8760 hours. HbA1C: No results for input(s): "HGBA1C" in the last 72 hours. CBG: Recent Labs  Lab 07/30/23 2327 07/31/23 0627  GLUCAP 97 77   Lipid Profile: No results for input(s): "CHOL", "HDL", "LDLCALC", "TRIG", "CHOLHDL", "LDLDIRECT" in the last 72 hours. Thyroid Function Tests: No results for input(s): "TSH", "T4TOTAL", "FREET4", "T3FREE", "THYROIDAB" in the last 72 hours. Anemia Panel: No results for input(s): "VITAMINB12", "FOLATE", "FERRITIN", "TIBC", "IRON", "RETICCTPCT" in the last 72 hours. Sepsis Labs: Recent Labs  Lab 07/29/23 1001 07/30/23 0418 07/31/23 0902 08/01/23 0638  PROCALCITON <0.10 0.10 0.16 0.24    Recent Results (from the past 240 hour(s))  Culture, body fluid w Gram Stain-bottle     Status: None   Collection Time: 07/26/23 12:33 PM   Specimen: Pleura  Result Value Ref Range Status   Specimen Description PLEURAL  Final   Special Requests PLEURAL  Final   Culture   Final    NO GROWTH 5 DAYS Performed at [facility], [address], [place], [place] [address]    Report Status 07/31/2023 FINAL  Final  Gram stain     Status: None   Collection Time: 07/26/23 12:33 PM   Specimen: Pleura  Result Value Ref Range Status   Specimen Description PLEURAL  Final   Special Requests NONE  Final   Gram Stain   Final    RARE WBC PRESENT,BOTH PMN AND MONONUCLEAR NO ORGANISMS SEEN Performed at [facility], [address], [place], [place] [address]  Report Status 07/26/2023 FINAL  Final  Respiratory (~20 pathogens) panel by PCR     Status: None   Collection Time: 07/27/23 12:28 PM   Specimen: Nasopharyngeal Swab; Respiratory  Result Value Ref Range Status   Adenovirus NOT DETECTED NOT  DETECTED Final   Coronavirus 229E NOT DETECTED NOT DETECTED Final    Comment: (NOTE) The Coronavirus on the Respiratory Panel, DOES NOT test for the novel  Coronavirus (2019 nCoV)    Coronavirus HKU1 NOT DETECTED NOT DETECTED Final   Coronavirus NL63 NOT DETECTED NOT DETECTED Final   Coronavirus OC43 NOT DETECTED NOT DETECTED Final   Metapneumovirus NOT DETECTED NOT DETECTED Final   Rhinovirus / Enterovirus NOT DETECTED NOT DETECTED Final   Influenza A NOT DETECTED NOT DETECTED Final   Influenza B NOT DETECTED NOT DETECTED Final   Parainfluenza Virus 1 NOT DETECTED NOT DETECTED Final   Parainfluenza Virus 2 NOT DETECTED NOT DETECTED Final   Parainfluenza Virus 3 NOT DETECTED NOT DETECTED Final   Parainfluenza Virus 4 NOT DETECTED NOT DETECTED Final   Respiratory Syncytial Virus NOT DETECTED NOT DETECTED Final   Bordetella pertussis NOT DETECTED NOT DETECTED Final   Bordetella Parapertussis NOT DETECTED NOT DETECTED Final   Chlamydophila pneumoniae NOT DETECTED NOT DETECTED Final   Mycoplasma pneumoniae NOT DETECTED NOT DETECTED Final    Comment: Performed at [facility], [address] [place], [place] [address]     Radiology Studies: EEG adult  Result Date: 26-Aug-2023 Charlsie Quest, MD     08-26-2023  8:47 AM Patient Name: Kristen Hansen MRN: [id number] Epilepsy Attending: Charlsie Quest Referring Physician/Provider: Meryl Dare, MD Date: 06/30/2023 Duration: 22.43 mins Patient history: 77yo f getting eeg to evaluate for seizure Level of alertness: Awake AEDs during EEG study: LTG, OXC Technical aspects: This EEG study was done with scalp electrodes positioned according to the 10-20 International system of electrode placement. Electrical activity was reviewed with band pass filter of 1-70Hz , sensitivity of 7 uV/mm, display speed of 73mm/sec with a 60Hz  notched filter applied as appropriate. EEG data were recorded continuously and digitally stored.  Video monitoring was  available and reviewed as appropriate. Description: EEG showed continuous generalized 3 to 6 Hz theta-delta slowing. Hyperventilation and photic stimulation were not performed.   ABNORMALITY - Continuous slow, generalized IMPRESSION: This study is suggestive of moderate diffuse encephalopathy, nonspecific etiology. No seizures or epileptiform discharges were seen throughout the recording. Charlsie Quest   CT CHEST WO CONTRAST  Result Date: 07/31/2023 CLINICAL DATA:  Pneumonia, pleural effusion suspected (Ped 0-17y) evaluate for pleural pathology in right lower hemithorax EXAM: CT CHEST WITHOUT CONTRAST TECHNIQUE: Multidetector CT imaging of the chest was performed following the standard protocol without IV contrast. RADIATION DOSE REDUCTION: This exam was performed according to the departmental dose-optimization program which includes automated exposure control, adjustment of the mA and/or kV according to patient size and/or use of iterative reconstruction technique. COMPARISON:  07/25/2023.  Chest x-ray today. FINDINGS: Cardiovascular: Heart is normal size. Coronary artery and aortic calcifications. Aorta normal caliber. Mediastinum/Nodes: No mediastinal, hilar, or axillary adenopathy. Trachea and esophagus are unremarkable. Thyroid unremarkable. Lungs/Pleura: Right pigtail pleural catheter in place with decreasing right pleural effusion. Small right pleural effusion remains with locules of gas throughout the pleural space. Bilateral lower lobe nodular ground-glass airspace opacities, right greater than left. Upper Abdomen: Left hydronephrosis noted with contrast in the collecting system from prior CTA head/neck. There is a large left UPJ stone measuring 1.8  x 1.0 cm. Multiple mid and lower pole left renal stones. Musculoskeletal: Bilateral breast implants, grossly unremarkable. No acute bony abnormality. IMPRESSION: Decreasing right pleural effusion following placement of posterior pigtail pleural catheter.  Small right pleural effusion and locules of pleural gas noted currently. Patchy bilateral ground-glass airspace disease, right greater than left. Cannot exclude pneumonia. 1.8 x 1.0 cm left UPJ stone with left hydronephrosis. Coronary artery disease. Aortic Atherosclerosis (ICD10-I70.0). Electronically Signed   By: Charlett Nose M.D.   On: 07/31/2023 17:03   MR BRAIN WO CONTRAST  Result Date: 07/31/2023 CLINICAL DATA:  Stroke, follow up rule out CVA, L side weakness previously reported. EXAM: MRI HEAD WITHOUT CONTRAST TECHNIQUE: Multiplanar, multiecho pulse sequences of the brain and surrounding structures were obtained without intravenous contrast. COMPARISON:  Head CT and CTA head/neck 07/31/2023. MRI brain 01/24/2021. FINDINGS: Brain: No acute infarct or hemorrhage. Mild chronic small-vessel disease with old lacunar infarct in the right cerebellar hemisphere. No hydrocephalus or extra-axial collection. No mass or midline shift. Vascular: Normal flow voids. Skull and upper cervical spine: Normal marrow signal. Sinuses/Orbits: Unremarkable. Other: None. IMPRESSION: 1. No acute intracranial process. 2. Mild chronic small-vessel disease with old lacunar infarct in the right cerebellar hemisphere. Electronically Signed   By: Orvan Falconer M.D.   On: 07/31/2023 17:01   CT ANGIO HEAD NECK W WO CM  Result Date: 07/31/2023 CLINICAL DATA:  Stroke, follow-up.  Rule out stroke. EXAM: CT ANGIOGRAPHY HEAD AND NECK WITH AND WITHOUT CONTRAST TECHNIQUE: Multidetector CT imaging of the head and neck was performed using the standard protocol during bolus administration of intravenous contrast. Multiplanar CT image reconstructions and MIPs were obtained to evaluate the vascular anatomy. Carotid stenosis measurements (when applicable) are obtained utilizing NASCET criteria, using the distal internal carotid diameter as the denominator. RADIATION DOSE REDUCTION: This exam was performed according to the departmental  dose-optimization program which includes automated exposure control, adjustment of the mA and/or kV according to patient size and/or use of iterative reconstruction technique. CONTRAST:  60mL OMNIPAQUE IOHEXOL 350 MG/ML SOLN COMPARISON:  Head CT 07/31/2023. FINDINGS: CTA NECK FINDINGS Aortic arch: Three-vessel arch configuration. Arch vessel origins are patent. Right carotid system: No evidence of dissection, stenosis (50% or greater), or occlusion. Left carotid system: No evidence of dissection, stenosis (50% or greater), or occlusion. Vertebral arteries: Codominant. No evidence of dissection, stenosis (50% or greater), or occlusion. Skeleton: Negative. Other neck: Negative. Upper chest: Partially imaged right pleural effusion with gas from chest tube placement, better evaluated on same day chest CT. Review of the MIP images confirms the above findings CTA HEAD FINDINGS Anterior circulation: Calcified plaque along the carotid siphons without hemodynamically significant stenosis. The proximal ACAs and MCAs are patent without stenosis or aneurysm. Distal branches are symmetric. Posterior circulation: Normal basilar artery. The SCAs, AICAs and PICAs are patent proximally. The PCAs are patent proximally without stenosis or aneurysm. Distal branches are symmetric. Venous sinuses: Early phase of contrast. Anatomic variants: Persistent fetal origin of the right PCA with hypoplastic right P1 segment. Review of the MIP images confirms the above findings IMPRESSION: 1. No large vessel occlusion, hemodynamically significant stenosis, or aneurysm in the head or neck. 2. Partially imaged right pleural effusion with gas from chest tube placement, better evaluated on same day chest CT. Electronically Signed   By: Orvan Falconer M.D.   On: 07/31/2023 16:42   US Abdomen Limited RUQ (LIVER/GB)  Result Date: 07/31/2023 CLINICAL DATA:  Right upper quadrant pain.  Ascites EXAM: ULTRASOUND ABDOMEN  LIMITED RIGHT UPPER QUADRANT  COMPARISON:  None Available. FINDINGS: Gallbladder: Distended gallbladder. Gallbladder is folded. No shadowing stones or adjacent fluid. Common bile duct: Diameter: 3 mm Liver: No focal lesion identified. Within normal limits in parenchymal echogenicity. Portal vein is patent on color Doppler imaging with normal direction of blood flow towards the liver. Other: Mild ascites identified along the abdomen. Right-sided pleural effusion IMPRESSION: No gallstones or ductal dilatation. Mild ascites. Right-sided small pleural effusion Electronically Signed   By: Karen Kays M.D.   On: 07/31/2023 15:17   DG CHEST PORT 1 VIEW  Result Date: 07/31/2023 CLINICAL DATA:  77 year old female with right side deficit, Code stroke. Right chest tube placed recently for right pleural effusion despite earlier thoracentesis. EXAM: PORTABLE CHEST 1 VIEW COMPARISON:  Portable chest 07/30/2023 and earlier. FINDINGS: Portable AP semi upright view at 0548 hours. Pigtail right chest tube appears stable, although difficult to exclude kinking (arrow). But veiling right lung base opacity remains improved since 07/29/2023. No superimposed pneumothorax or pulmonary edema. Mediastinal contours remain within normal limits. Left lung appears stable and negative. Partially visible ACDF. Paucity of bowel gas in the visible abdomen. IMPRESSION: Difficult to exclude kinked right pigtail chest tube (arrow), but right pleural effusion remains regressed since 07/29/2023, and no new cardiopulmonary abnormality identified. A lateral view may be valuable when feasible. Electronically Signed   By: Odessa Fleming M.D.   On: 07/31/2023 07:03   CT HEAD CODE STROKE WO CONTRAST  Result Date: 07/31/2023 CLINICAL DATA:  Code stroke. 77 year old female with right side deficit. EXAM: CT HEAD WITHOUT CONTRAST TECHNIQUE: Contiguous axial images were obtained from the base of the skull through the vertex without intravenous contrast. RADIATION DOSE REDUCTION: This exam was  performed according to the departmental dose-optimization program which includes automated exposure control, adjustment of the mA and/or kV according to patient size and/or use of iterative reconstruction technique. COMPARISON:  Brain MRI 01/24/2021.  Head CT 06/29/2022. FINDINGS: Brain: Cerebral volume appears stable and within normal limits for age. No midline shift, ventriculomegaly, mass effect, evidence of mass lesion, intracranial hemorrhage or evidence of cortically based acute infarction. Patchy bilateral white matter hypodensity, asymmetrically involving the deep white matter capsules on the left, appear stable from the previous MRI. No convincing acute deep gray nuclei or brainstem finding. But there is a possible new small right cerebellar infarct since last year on coronal image 20. Vascular: No suspicious intracranial vascular hyperdensity. Calcified atherosclerosis at the skull base. Skull: No acute osseous abnormality identified. Sinuses/Orbits: Visualized paranasal sinuses and mastoids are stable and well aerated. Other: No gaze deviation. Visualized orbits and scalp soft tissues are within normal limits. ASPECTS [facility] Stroke Program Early CT Score) Total score (0-10 with 10 being normal): 10 IMPRESSION: 1. No acute cortically based infarct or acute intracranial hemorrhage identified. Chronic white matter disease and possible new small right cerebellar infarct since last year. 2.  ASPECTS 10. 3. These results were communicated to Dr. Otelia Limes at 6:57 am on 07/31/2023 by text Bill via the [facility] messaging system. Electronically Signed   By: Odessa Fleming M.D.   On: 07/31/2023 06:58   CARDIAC CATHETERIZATION  Result Date: 07/30/2023 No angiographic evidence of CAD Normal right and left heart pressures (RA 7, RV 40/4/8, PA 44/12 mean 22, PCWP 11) Recommendations: No further ischemic workup.    Scheduled Meds:  amiodarone  200 mg Oral BID   aspirin  81 mg Oral Daily   colchicine  0.6 mg Oral Daily  irbesartan  150 mg Oral Daily   lamoTRIgine  150 mg Oral Daily   methIMAzole  10 mg Oral Daily   metoprolol tartrate  25 mg Oral BID   montelukast  10 mg Oral QHS   Oxcarbazepine  300 mg Oral BID   pantoprazole  40 mg Oral Daily   pravastatin  40 mg Oral QAC breakfast   pyridostigmine  60 mg Oral Q8H   sertraline  50 mg Oral Daily   sodium chloride flush  3 mL Intravenous Q12H   sodium chloride flush  3 mL Intravenous Q12H   spironolactone  12.5 mg Oral Daily   Continuous Infusions:  sodium chloride     sodium chloride 100 mL/hr at 07/31/23 2136   heparin 1,350 Units/hr (08/01/23 0724)   piperacillin-tazobactam     piperacillin-tazobactam (ZOSYN)  IV       LOS: 7 days   Hughie Closs, MD [facility]  08/01/2023, 10:04 AM   *Please note that this is a verbal dictation therefore any spelling or grammatical errors are due to the "Dragon Medical One" system interpretation.  Please [place] via Amion and do not message via secure chat for urgent patient care matters. Secure chat can be used for non urgent patient care matters.  How to contact the [facility] Attending or Consulting provider 7A - 7P or covering provider during after hours 7P -7A, for this patient?  Check the care team in [facility] and look for a) attending/consulting TRH provider listed and b) the [facility] team listed. [place] or secure chat 7A-7P. Log into [email or web address] and use [place]'s universal password to access. If you do not have the password, please contact the hospital operator. Locate the [facility] provider you are looking for under [facility] and [place] to a number that you can be directly reached. If you still have difficulty reaching the provider, please Manni the [facility] (Director on Call) for the Hospitalists listed on amion for assistance.

## 2023-08-01 NOTE — Progress Notes (Signed)
   NAME:  Kristen Hansen, MRN:  [id number], DOB:  1946-05-22, LOS: 7 ADMISSION DATE:  07/25/2023, CONSULTATION DATE: 07/27/2023 REFERRING MD: Dr. Thedore Mins, CHIEF COMPLAINT: Pleural effusion  History of Present Illness:  Asked to see for recurrent pleural effusion Recently had a thoracenteses for 1.1 L of exudative fluid  Denies any significant complaints at present  Presented to the hospital with chest pain, shortness of breath with activity  Pertinent  Medical History   Past Medical History:  Diagnosis Date   Back pain    Cancer (HCC)    Skin, basal cell   Dyspnea    Fatigue    GERD (gastroesophageal reflux disease)    Headache(784.0)    history of migraines   History of kidney stones    Hypercholesteremia    Hypertension    Leg swelling    Bilateral   Occasional tremors    mild hand tremors   Osteoarthritis    PONV (postoperative nausea and vomiting)    RBD (REM behavioral disorder)    Rheumatoid arthritis(714.0)    Scoliosis of lumbar spine    Spinal stenosis    Multiple levels   T[place]    Trigeminal neuralgia 03/30/2015   Vision abnormalities    Significant Hospital Events: Including procedures, antibiotic start and stop dates in addition to other pertinent events   8/1 thoracentesis 1.3 L 8/4 chest tube placement> 1.35L drained that shift  Interim History / Subjective:  More awake today  Objective   Blood pressure (!) 162/76, pulse 64, temperature (!) 97.5 F (36.4 C), temperature source Oral, resp. rate 16, height 5\' 10"  (1.778 m), weight 52.5 kg, SpO2 97%.        Intake/Output Summary (Last 24 hours) at 08/01/2023 0946 Last data filed at 08/01/2023 0429 Gross per 24 hour  Intake 1536.81 ml  Output 450 ml  Net 1086.81 ml   Filed Weights   07/27/23 0110 07/31/23 0403 08/01/23 0422  Weight: 55.8 kg 53.6 kg 52.5 kg    Examination: Frail elderly female more awake today No JVD lymphadenopathy is appreciated deCreased air movement throughout, right  chest tube not known suction, and no documented drainage Heart sounds are regular Abdomen soft nontender Extremities with mild edema From much more awake today follows simple commands tracks and follows     Resolved Hospital Problem list     Assessment & Plan:  Right sided pleural effusion, weakly exudative - differential diagnosis serositis due to pleuropericarditis, aspiration related reactive effusion. Heart failure with diuresis is most likely with such low cellularity. Normal glucose suggests not RA-related.   Stopcock placed in the off position with plan to recheck cell count differential and cultures Resume chest tube to suction once fluid has been obtained CT of the chest 07/31/2023 did show some pleural gas Continue antibiotics    Acute on chronic HFpEF, new RWMA on echo Cardiology is following  Atrial fibrillation, new onset Cardiology is following  Pericarditis- autoimmune? Continuing colchicine  History of Sjogren's syndrome & rheumatoid arthritis Continue current medications monitor  Status post code stroke 07/30/2023 CT was negative MRI is negative Suspect is related to sedation from cardiac catheterization Neurology is following  Brett Canales  ACNP Acute Care Nurse Practitioner [facility] Please consult Amion 08/01/2023, 9:46 AM

## 2023-08-01 NOTE — Progress Notes (Signed)
OT Cancellation Note  Patient Details Name: Kristen Hansen MRN: [id number] DOB: Jun 15, 1946   Cancelled Treatment:    Reason Eval/Treat Not Completed:  (Pt eating lunch, will try back as schedule allows.)  Evern Bio 08/01/2023, 2:30 PM Berna Spare, OTR/L Acute Rehabilitation Services Office: [phone]

## 2023-08-01 NOTE — Progress Notes (Signed)
Pharmacy Antibiotic Note  Kristen Hansen is a 77 y.o. female  with R pleural effusion and  possible PNA. Pharmacy has been consulted for zosyn dosing. -WBC= 6.7, afebrile, CrCl ~ 45  Plan: -Zosyn 3.375gm IV q8h -Will follow renal function and clinical progress   Height: 5\' 10"  (177.8 cm) Weight: 52.5 kg (115 lb 11.9 oz) IBW/kg (Calculated) : 68.5  Temp (24hrs), Avg:97.6 F (36.4 C), Min:97.4 F (36.3 C), Max:98 F (36.7 C)  Recent Labs  Lab 07/28/23 0403 07/29/23 0327 07/30/23 0418 07/31/23 0714 08/01/23 0638  WBC 8.9 8.1 9.8 14.1* 6.7  CREATININE 0.95 1.00 1.04* 1.15* 0.88    Estimated Creatinine Clearance: 45.1 mL/min (by C-G formula based on SCr of 0.88 mg/dL).    Allergies  Allergen Reactions   Morphine And Codeine Hives   Prevacid [Lansoprazole] Other (See Comments)    unknown   Provigil [Modafinil] Other (See Comments)    dizziness   Adhesive [Tape] Rash    bandaids     Antimicrobials this admission: 8/7 zosyn>>  Dose adjustments this admission:  Microbiology results: 8/1 pleural cx- neg  Thank you for allowing pharmacy to be a part of this patient's care.  Harland German, PharmD Clinical Pharmacist **Pharmacist phone directory can now be found on [email or web address] (PW [email or web address]).  Listed under [facility].

## 2023-08-01 NOTE — Progress Notes (Signed)
Speech Language Pathology Treatment: Dysphagia  Patient Details Name: Kristen Hansen MRN: [id number] DOB: 1946-10-02 Today's Date: 08/01/2023 Time: 4098-1191 SLP Time Calculation (min) (ACUTE ONLY): 15 min  Assessment / Plan / Recommendation Clinical Impression  Patient seen by SLP for skilled treatment focused on education of spouse regarding dysphagia goals. Patient was asleep. SLP discussed plan regarding thickening liquids or not and he confirmed that he would like to not thicken them while she is in the hospital but that he wishes to try thickening some of her liquids (water, iced tea) to see if she notices the difference and if she tolerates that better. SLP provided spouse with printed materials explaining a couple different soft solids diets (IDDSI Level 6 and 7) as well as sample pack of nectar thick gel thickener. He asked if there were anything else SLP recommended and so SLP discussed maximizing her PO nutrition when she is eating, consulting with dietician if concern for weight loss, focusing on food items that she will eat and offering/providing foods whenever she is willing. In addition, recommended ensuring no PO's in mouth when she is asleep/lying down. Spouse is understanding that part of the progression of dementia is refusal and inability to safely eat/drink PO's. SLP provided email address and encouraged spouse to contact him with any questions. SLP to s/o at this time.    HPI HPI: Patient is a 77 y.o. female with PMH: Lewy body dementia, HTN, HLD, Sjogren's syndrome, RA, chronic pain, DVT, spinal stenosis, diastolic CHF, depression, REM behavior disorder, orthostatic hypotension, CKD 3, GERD, hypothyroidism. She presented to the hospital on 06/27/23 with chest pain.  CT PE study showed no PE but did show a large right pleural effusion with compressive atelectasis.  ardiology was consulted in ED and had low suspicion for MI based on EKG review. CXR showed Moderate right pleural effusion,  increased since the prior  radiograph. She had MBS completed at [facility] on 05/15/2023 which reported decline of swallow function since 2022 MBS consisting of "mistiming of the pharyngeal swallow, decreased bolus propulsion (base of tongue retraction), incomplete laryngeal vestibule closure, and diminished laryngeal pharyngeal sensation. Deficits result in mild post-swallow residual in the valleculae; penetration of mildly-thick liquid, and puree; and penetration AND aspiration of thin liquid." She was exhibiting stroke like symptoms on 8/6 and was made NPO. MRI negative for acute intracranial changes.      SLP Plan  Discharge SLP treatment due to (comment);All goals met      Recommendations for follow up therapy are one component of a multi-disciplinary discharge planning process, led by the attending physician.  Recommendations may be updated based on patient status, additional functional criteria and insurance authorization.    Recommendations  Diet recommendations: Dysphagia 3 (mechanical soft);Other(comment) (liquids as per patient/spouse choice) Liquids provided via: Straw;Cup Medication Administration: Whole meds with puree Supervision: Patient able to self feed;Staff to assist with self feeding;Full supervision/cueing for compensatory strategies Compensations: Small sips/bites;Slow rate Postural Changes and/or Swallow Maneuvers: Seated upright 90 degrees                  Oral care BID   Frequent or constant Supervision/Assistance       Discharge SLP treatment due to (comment);All goals met   Angela Nevin, MA, CCC-SLP Speech Therapy

## 2023-08-01 NOTE — Progress Notes (Signed)
ANTICOAGULATION CONSULT NOTE  Pharmacy Consult for Heparin Indication: atrial fibrillation  Allergies  Allergen Reactions   Morphine And Codeine Hives   Prevacid [Lansoprazole] Other (See Comments)    unknown   Provigil [Modafinil] Other (See Comments)    dizziness   Adhesive [Tape] Rash    bandaids     Patient Measurements: Height: 5\' 10"  (177.8 cm) Weight: 52.5 kg (115 lb 11.9 oz) IBW/kg (Calculated) : 68.5 Heparin Dosing Weight: 56 kg  Vital Signs: Temp: 97.5 F (36.4 C) (08/07 0817) Temp Source: Oral (08/07 0817) BP: 162/76 (08/07 0817) Pulse Rate: 64 (08/07 0817)  Labs: Recent Labs    07/30/23 0418 07/30/23 1225 07/30/23 1226 07/31/23 0714 07/31/23 2141 08/01/23 0638  HGB 10.9*   < > 9.9* 12.2  --  9.8*  HCT 33.5*   < > 29.0* 37.7  --  30.9*  PLT 263  --   --  307  --  266  HEPARINUNFRC 0.49  --   --   --  0.21* <0.10*  CREATININE 1.04*  --   --  1.15*  --  0.88   < > = values in this interval not displayed.    Estimated Creatinine Clearance: 45.1 mL/min (by C-G formula based on SCr of 0.88 mg/dL).  Assessment: Patient is a 80 yof admitted for chest pain and new onset afib. Patient found to have pericardial effusion/pericarditis. She had thoracentesis 8/1 with 1.3L removed and now s/p cath 8/5. Code stroke called 8/5 and CT and MRI negative for acute stroke. Plans for oral anticoagulation after the chest tube is removed  Heparin level undetectable on 1300 units/hr.   Goal of Therapy:  Heparin level 0.3-0.7 units/ml Monitor platelets by anticoagulation protocol: Yes   Plan:   -Increase heparin to 1500 units/hr -heparin level in 8 hrs  Harland German, PharmD Clinical Pharmacist **Pharmacist phone directory can now be found on [email or web address] (PW [email or web address]).  Listed under [facility].

## 2023-08-01 NOTE — Progress Notes (Signed)
Occupational Therapy Treatment Patient Details Name: VERLINDA KINNISON MRN: [id number] DOB: 03/17/1946 Today's Date: 08/01/2023   History of present illness Pt is 77 yo female [facility] ED with chest pain. Chest pain has been moving and is increased with palpation. PMH: HTN, hyperlipidemia, Lewy body dementia, Sjogren's syndrome, rheumatoid arthritis, chronic pain, DVT, Spine disease, spinal stenosis, diastolic CHF, depression, REM behavior disorder, orthostatic Hypotension, CKD 3, GERD, hypothrydoidism.   OT comments  Pt with watery BM. Rolled multiple times with mod assist for clean up, linen change and for LB dressing. Max assist to change soiled gown.       If plan is discharge home, recommend the following:  A lot of help with walking and/or transfers;A lot of help with bathing/dressing/bathroom;Assistance with cooking/housework;Direct supervision/assist for medications management;Direct supervision/assist for financial management;Help with stairs or ramp for entrance;Assist for transportation;Assistance with feeding   Equipment Recommendations  None recommended by OT    Recommendations for Other Services      Precautions / Restrictions Precautions Precautions: Fall Precaution Comments: watch BP, chest tube Restrictions Weight Bearing Restrictions: No       Mobility Bed Mobility Overal bed mobility: Needs Assistance Bed Mobility: Rolling Rolling: Mod assist         General bed mobility comments: use of rail, rolled several times for pericare, linen change and donning depends    Transfers                         Balance                                           ADL either performed or assessed with clinical judgement   ADL Overall ADL's : Needs assistance/impaired                 Upper Body Dressing : Maximal assistance;Bed level   Lower Body Dressing: Total assistance;Bed level       Toileting- Clothing Manipulation and  Hygiene: Total assistance;Bed level         General ADL Comments: pt with loose BM, assisted with clean up and donning clean depends    Extremity/Trunk Assessment              Vision       Perception     Praxis      Cognition Arousal: Alert Behavior During Therapy: Flat affect Overall Cognitive Status: History of cognitive impairments - at baseline                                          Exercises      Shoulder Instructions       General Comments      Pertinent Vitals/ Pain       Pain Assessment Pain Assessment: Faces Faces Pain Scale: Hurts little more Pain Location: chest tube site Pain Descriptors / Indicators: Guarding, Grimacing Pain Intervention(s): Repositioned, Monitored during session  Home Living                                          Prior Functioning/Environment  Frequency  Min 1X/week        Progress Toward Goals  OT Goals(current goals can now be found in the care plan section)  Progress towards OT goals: Progressing toward goals  Acute Rehab OT Goals OT Goal Formulation: With patient/family Time For Goal Achievement: 08/10/23 Potential to Achieve Goals: Good  Plan Discharge plan remains appropriate    Co-evaluation                 AM-PAC OT "6 Clicks" Daily Activity     Outcome Measure   Help from another person eating meals?: A Little Help from another person taking care of personal grooming?: A Little Help from another person toileting, which includes using toliet, bedpan, or urinal?: Total Help from another person bathing (including washing, rinsing, drying)?: A Lot Help from another person to put on and taking off regular upper body clothing?: A Lot Help from another person to put on and taking off regular lower body clothing?: Total 6 Click Score: 12    End of Session    OT Visit Diagnosis: Unsteadiness on feet (R26.81);Other abnormalities of gait and  mobility (R26.89);Muscle weakness (generalized) (M62.81);Other symptoms and signs involving cognitive function   Activity Tolerance Patient tolerated treatment well   Patient Left in bed;with call bell/phone within reach;with bed alarm set;with family/visitor present   Nurse Communication          Time: 1610-9604 OT Time Calculation (min): 34 min  Charges: OT General Charges $OT Visit: 1 Visit OT Treatments $Self Care/Home Management : 8-22 mins $Therapeutic Activity: 8-22 mins Berna Spare, OTR/L Acute Rehabilitation Services Office: [phone]   Evern Bio 08/01/2023, 3:42 PM

## 2023-08-01 NOTE — Progress Notes (Addendum)
STROKE TEAM PROGRESS NOTE   BRIEF HPI Kristen Hansen is an 77 y.o. female with a complex PMHx as outlined below including Lewy body dementia, recent admission for heart catheterization complicated by pleural effusion, who was noted by her RN this morning to have AMS with possible right sided weakness. Furthermore, pt was somnolent the night prior (attributed to pt's nightly klonopin) and continued into this morning.    SIGNIFICANT HOSPITAL EVENTS ED to hospital admission 7/31 Code stroke called 8/6 early morning  INTERIM HISTORY/SUBJECTIVE Patient is difficult to assess due baseline dementia. Spouse is at bedside. Pt is more responsive today. She denies any concerns. She reports sleeping and eating okay.    OBJECTIVE  CBC    Component Value Date/Time   WBC 6.7 08/01/2023 0638   RBC 3.74 (L) 08/01/2023 0638   HGB 9.8 (L) 08/01/2023 0638   HGB 10.7 (L) 06/03/2021 1014   HCT 30.9 (L) 08/01/2023 0638   HCT 32.3 (L) 06/03/2021 1014   PLT 266 08/01/2023 0638   PLT 211 06/03/2021 1014   MCV 82.6 08/01/2023 0638   MCV 89 06/03/2021 1014   MCH 26.2 08/01/2023 0638   MCHC 31.7 08/01/2023 0638   RDW 14.0 08/01/2023 0638   RDW 12.5 06/03/2021 1014   LYMPHSABS 1.1 08/01/2023 0638   LYMPHSABS 1.3 06/03/2021 1014   MONOABS 0.5 08/01/2023 0638   EOSABS 0.3 08/01/2023 0638   EOSABS 0.1 06/03/2021 1014   BASOSABS 0.0 08/01/2023 0638   BASOSABS 0.0 06/03/2021 1014    BMET    Component Value Date/Time   NA 132 (L) 08/01/2023 0638   NA 139 06/03/2021 1014   K 3.4 (L) 08/01/2023 0638   CL 100 08/01/2023 0638   CO2 19 (L) 08/01/2023 0638   GLUCOSE 71 08/01/2023 0638   BUN 13 08/01/2023 0638   BUN 31 (H) 06/03/2021 1014   CREATININE 0.88 08/01/2023 0638   CALCIUM 8.1 (L) 08/01/2023 0638   EGFR 49 (L) 06/03/2021 1014   GFRNONAA >60 08/01/2023 0638    IMAGING past 24 hours EEG adult  Result Date: 08/01/2023 Charlsie Quest, MD     08/01/2023  8:47 AM Patient Name: Kristen Hansen MRN:  [id number] Epilepsy Attending: Charlsie Quest Referring Physician/Provider: Meryl Dare, MD Date: 06/30/2023 Duration: 22.43 mins Patient history: 77yo f getting eeg to evaluate for seizure Level of alertness: Awake AEDs during EEG study: LTG, OXC Technical aspects: This EEG study was done with scalp electrodes positioned according to the 10-20 International system of electrode placement. Electrical activity was reviewed with band pass filter of 1-70Hz , sensitivity of 7 uV/mm, display speed of 57mm/sec with a 60Hz  notched filter applied as appropriate. EEG data were recorded continuously and digitally stored.  Video monitoring was available and reviewed as appropriate. Description: EEG showed continuous generalized 3 to 6 Hz theta-delta slowing. Hyperventilation and photic stimulation were not performed.   ABNORMALITY - Continuous slow, generalized IMPRESSION: This study is suggestive of moderate diffuse encephalopathy, nonspecific etiology. No seizures or epileptiform discharges were seen throughout the recording. Charlsie Quest   CT CHEST WO CONTRAST  Result Date: 07/31/2023 CLINICAL DATA:  Pneumonia, pleural effusion suspected (Ped 0-17y) evaluate for pleural pathology in right lower hemithorax EXAM: CT CHEST WITHOUT CONTRAST TECHNIQUE: Multidetector CT imaging of the chest was performed following the standard protocol without IV contrast. RADIATION DOSE REDUCTION: This exam was performed according to the departmental dose-optimization program which includes automated exposure control, adjustment of the mA and/or kV according  to patient size and/or use of iterative reconstruction technique. COMPARISON:  07/25/2023.  Chest x-ray today. FINDINGS: Cardiovascular: Heart is normal size. Coronary artery and aortic calcifications. Aorta normal caliber. Mediastinum/Nodes: No mediastinal, hilar, or axillary adenopathy. Trachea and esophagus are unremarkable. Thyroid unremarkable. Lungs/Pleura: Right pigtail pleural  catheter in place with decreasing right pleural effusion. Small right pleural effusion remains with locules of gas throughout the pleural space. Bilateral lower lobe nodular ground-glass airspace opacities, right greater than left. Upper Abdomen: Left hydronephrosis noted with contrast in the collecting system from prior CTA head/neck. There is a large left UPJ stone measuring 1.8 x 1.0 cm. Multiple mid and lower pole left renal stones. Musculoskeletal: Bilateral breast implants, grossly unremarkable. No acute bony abnormality. IMPRESSION: Decreasing right pleural effusion following placement of posterior pigtail pleural catheter. Small right pleural effusion and locules of pleural gas noted currently. Patchy bilateral ground-glass airspace disease, right greater than left. Cannot exclude pneumonia. 1.8 x 1.0 cm left UPJ stone with left hydronephrosis. Coronary artery disease. Aortic Atherosclerosis (ICD10-I70.0). Electronically Signed   By: Charlett Nose M.D.   On: 07/31/2023 17:03   MR BRAIN WO CONTRAST  Result Date: 07/31/2023 CLINICAL DATA:  Stroke, follow up rule out CVA, L side weakness previously reported. EXAM: MRI HEAD WITHOUT CONTRAST TECHNIQUE: Multiplanar, multiecho pulse sequences of the brain and surrounding structures were obtained without intravenous contrast. COMPARISON:  Head CT and CTA head/neck 07/31/2023. MRI brain 01/24/2021. FINDINGS: Brain: No acute infarct or hemorrhage. Mild chronic small-vessel disease with old lacunar infarct in the right cerebellar hemisphere. No hydrocephalus or extra-axial collection. No mass or midline shift. Vascular: Normal flow voids. Skull and upper cervical spine: Normal marrow signal. Sinuses/Orbits: Unremarkable. Other: None. IMPRESSION: 1. No acute intracranial process. 2. Mild chronic small-vessel disease with old lacunar infarct in the right cerebellar hemisphere. Electronically Signed   By: Orvan Falconer M.D.   On: 07/31/2023 17:01   CT ANGIO HEAD  NECK W WO CM  Result Date: 07/31/2023 CLINICAL DATA:  Stroke, follow-up.  Rule out stroke. EXAM: CT ANGIOGRAPHY HEAD AND NECK WITH AND WITHOUT CONTRAST TECHNIQUE: Multidetector CT imaging of the head and neck was performed using the standard protocol during bolus administration of intravenous contrast. Multiplanar CT image reconstructions and MIPs were obtained to evaluate the vascular anatomy. Carotid stenosis measurements (when applicable) are obtained utilizing NASCET criteria, using the distal internal carotid diameter as the denominator. RADIATION DOSE REDUCTION: This exam was performed according to the departmental dose-optimization program which includes automated exposure control, adjustment of the mA and/or kV according to patient size and/or use of iterative reconstruction technique. CONTRAST:  60mL OMNIPAQUE IOHEXOL 350 MG/ML SOLN COMPARISON:  Head CT 07/31/2023. FINDINGS: CTA NECK FINDINGS Aortic arch: Three-vessel arch configuration. Arch vessel origins are patent. Right carotid system: No evidence of dissection, stenosis (50% or greater), or occlusion. Left carotid system: No evidence of dissection, stenosis (50% or greater), or occlusion. Vertebral arteries: Codominant. No evidence of dissection, stenosis (50% or greater), or occlusion. Skeleton: Negative. Other neck: Negative. Upper chest: Partially imaged right pleural effusion with gas from chest tube placement, better evaluated on same day chest CT. Review of the MIP images confirms the above findings CTA HEAD FINDINGS Anterior circulation: Calcified plaque along the carotid siphons without hemodynamically significant stenosis. The proximal ACAs and MCAs are patent without stenosis or aneurysm. Distal branches are symmetric. Posterior circulation: Normal basilar artery. The SCAs, AICAs and PICAs are patent proximally. The PCAs are patent proximally without stenosis or aneurysm. Distal  branches are symmetric. Venous sinuses: Early phase of  contrast. Anatomic variants: Persistent fetal origin of the right PCA with hypoplastic right P1 segment. Review of the MIP images confirms the above findings IMPRESSION: 1. No large vessel occlusion, hemodynamically significant stenosis, or aneurysm in the head or neck. 2. Partially imaged right pleural effusion with gas from chest tube placement, better evaluated on same day chest CT. Electronically Signed   By: Orvan Falconer M.D.   On: 07/31/2023 16:42   US Abdomen Limited RUQ (LIVER/GB)  Result Date: 07/31/2023 CLINICAL DATA:  Right upper quadrant pain.  Ascites EXAM: ULTRASOUND ABDOMEN LIMITED RIGHT UPPER QUADRANT COMPARISON:  None Available. FINDINGS: Gallbladder: Distended gallbladder. Gallbladder is folded. No shadowing stones or adjacent fluid. Common bile duct: Diameter: 3 mm Liver: No focal lesion identified. Within normal limits in parenchymal echogenicity. Portal vein is patent on color Doppler imaging with normal direction of blood flow towards the liver. Other: Mild ascites identified along the abdomen. Right-sided pleural effusion IMPRESSION: No gallstones or ductal dilatation. Mild ascites. Right-sided small pleural effusion Electronically Signed   By: Karen Kays M.D.   On: 07/31/2023 15:17    Vitals:   07/31/23 1717 07/31/23 2141 08/01/23 0422 08/01/23 0817  BP: (!) 158/68 (!) 133/58 (!) 155/81 (!) 162/76  Pulse: 70 69 61 64  Resp: 14   16  Temp:   (!) 97.4 F (36.3 C) (!) 97.5 F (36.4 C)  TempSrc: Oral  Axillary Oral  SpO2: 98%  92% 97%  Weight:   52.5 kg   Height:         PHYSICAL EXAM General:  Alert, well-nourished, well-developed patient in no acute distress Psych:  Mood and affect appropriate for situation CV: Regular rate and rhythm on monitor Respiratory:  Regular, unlabored respirations on room air GI: Abdomen soft and nontender   NEURO:  Mental Status: AA&O to person and place but not time, patient is not able to give clear and coherent history but can  answer simple questions. Attention impaired but now at baseline. Unable to assess memory due to inattention.  Speech/Language: speech is without dysarthria or aphasia.  Naming, repetition, fluency, and comprehension intact.  Cranial Nerves:  II: PERRL. Visual fields full.  III, IV, VI: EOMI. Eyelids elevate symmetrically.  V: Sensation is intact to light touch and symmetrical to face.  VII: Face is symmetrical resting and smiling VIII: hearing intact to voice. IX, X: Palate elevates symmetrically. Phonation is normal.  YN:WGNFAOZH shrug 5/5. XII: tongue is midline without fasciculations. Motor: 5/5 strength to all muscle groups tested. Asterixis of right hand when arms outstretched. Subtle resting tremor of R finger.  Tone: is normal and bulk is normal Sensation- Intact to light touch bilaterally.    Coordination: FTN intact bilaterally, could not assess HTS.   Gait- deferred   ASSESSMENT/PLAN  Continues to have no focal neurologic deficits. Less disoriented today and attention now at baseline. Fits more metabolic encephalopathy secondary and/or delirium that is resolving.   Likely metabolic encephalopathy, less likely seizure or TIA Code Stroke CT head: No acute abnormality. Chronic white matter disease and small vessel disease. New finding of cerebellar lesion likely from past year. ASPECTS 10.    CTA head and neck no LVO, stenosis, or aneurysm of head and neck MRI  no acute intracranial abnormality EEG suggestive of moderate diffuse encephalopathy, nonspecific etiology. No seizures or epileptiform discharges LDL 74 HgbA1c 5.2 VTE prophylaxis - SCD No antithrombotic prior to admission, now on heparin IV,  transition to DOAC once appropriate. Therapy recommendations:  Home Health PT and Home Health OT Disposition:  pending  Paroxysmal Atrial fibrillation New diagnosis Continue telemetry monitoring now on heparin IV, transition to DOAC once appropriate. Cardiology on  board Cardio cath 8/5 unremarkable   Hypertension Home meds:  amLODipine-olmesartan 5-40 mg Unstable, labile BP goal of normotensive   Hyperlipidemia Home meds:  pravastatin 40, resumed in hospital LDL 74, goal < 70 No high intensity statin due to LDL not far from goal Continue statin at discharge  Dysphagia Patient has post-stroke dysphagia SLP consulted Currently NPO Advance diet as tolerated On IVF  Other Stroke Risk Factors ETOH use, advised to drink no more than 1 drink a day Congestive heart failure  Other Active Problems Pleural effusion, CCM on board, s/p thoracentesis x 2 Leukocytosis WBC 9.8-14.1, UA not consistent with UTI (incident high Hgb) Hyponatremia sodium 130 --> 132 (8/7)   Hospital day # 7   Meryl Dare, MD PGY-1 Psychiatry Resident 08/01/2023, 10:41 AM   YESTERDAY ATTENDING NOTE: I reviewed above note and agree with the assessment and plan. Pt was seen and examined.   Husband at bedside.  Patient reclining in bed, eyes open, awake alert, more awake alert than yesterday.  She has dysarthria, orientated to people and places, however not oriented to time or age.  Limited language output but with perseveration, and follows most simple commands. No focal neurologic deficit, however bilateral upper extremity mild asterixis.  MRI no acute infarct. EEG showed encephalopathy, no seizure. CTA head and neck unremarkalb.e Etiology of patient's symptoms overnight and this morning likely metabolic encephalopathy from multiple medical issues.  Continue heparin IV for PAF and continue statin.  Treat underlying medical issues, encephalopathy management per primary team.  Transition heparin IV to DOAC once stable, and no more procedure needed.   For detailed assessment and plan, please refer to above/below as I have made changes wherever appropriate. Neurology will sign off. Please call with questions. Thanks for the consult.  Marvel Plan, MD PhD Stroke  Neurology 08/01/2023 10:41 AM    To contact Stroke Continuity provider, please refer to [email or web address]. After hours, contact General Neurology

## 2023-08-01 NOTE — Progress Notes (Signed)
ANTICOAGULATION CONSULT NOTE  Pharmacy Consult for Heparin Indication: atrial fibrillation  Allergies  Allergen Reactions   Morphine And Codeine Hives   Prevacid [Lansoprazole] Other (See Comments)    unknown   Provigil [Modafinil] Other (See Comments)    dizziness   Adhesive [Tape] Rash    bandaids     Patient Measurements: Height: 5\' 10"  (177.8 cm) Weight: 52.5 kg (115 lb 11.9 oz) IBW/kg (Calculated) : 68.5 Heparin Dosing Weight: 56 kg  Vital Signs: Temp: 97.5 F (36.4 C) (08/07 1619) Temp Source: Axillary (08/07 1619) BP: 114/56 (08/07 1619) Pulse Rate: 58 (08/07 1619)  Labs: Recent Labs    07/30/23 0418 07/30/23 1225 07/30/23 1226 07/31/23 0714 07/31/23 2141 08/01/23 0638 08/01/23 1739  HGB 10.9*   < > 9.9* 12.2  --  9.8*  --   HCT 33.5*   < > 29.0* 37.7  --  30.9*  --   PLT 263  --   --  307  --  266  --   HEPARINUNFRC 0.49  --   --   --  0.21* <0.10* 0.48  CREATININE 1.04*  --   --  1.15*  --  0.88  --    < > = values in this interval not displayed.    Estimated Creatinine Clearance: 45.1 mL/min (by C-G formula based on SCr of 0.88 mg/dL).  Assessment: Patient is a 76 yof admitted for chest pain and new onset afib. Patient found to have pericardial effusion/pericarditis. She had thoracentesis 8/1 with 1.3L removed and now s/p cath 8/5. Code stroke called 8/5 and CT and MRI negative for acute stroke. Plans for oral anticoagulation after the chest tube is removed  Heparin level is 0.48 (therapeutic) on 1500 units/hr.  No bleeding or problems with infusion, per discussion with RN.   Goal of Therapy:  Heparin level 0.3-0.7 units/ml Monitor platelets by anticoagulation protocol: Yes   Plan:   Continue Heparin infusion at 1500 units/hr Check confirmatory Heparin level in 6 hrs at midnight  Check daily CBC monitoring for signs and symptoms of bleeding   Gwynn Burly, PharmD PGY-1 Pharmacy Resident 08/01/2023 7:30 PM

## 2023-08-01 NOTE — Progress Notes (Addendum)
Patient Name: Kristen Hansen Date of Encounter: 08/01/2023 [facility] Cardiologist: None   Interval Summary  .    77 y.o. female with PMH of HTN, HLD, , Lewy body dementia, Sjogren's syndrome, rheumatoid arthritis, chronic pain, DVT, spinal stenosis, diastolic CHF, depression, CKD III, GERD, hyperthyroidism, cardiology is consulted and following for chest pain.   Echo 8/1 showed LVEF 50-55%, distal septal apical hypokinesis, normal RV, small pericardial effsuion, mild MR, severe MAC, mild to moderate TR, aortic sclerosis.   R/L heart cath 8/5 showed no evidence of CADm normal R/L heart pressures (RA 7, RV 40/4/8, PA 44/12 mean 22, PCWP 11).  Chest pain felt due to pericarditis given elevated CRP , started on colchicine. She was also noted in new onset paroxysmal A fib this admission, converted on amiodarone.  Course is further complicated by code stroke 8/6 AM due to right sided weakness and somnolence. Neuro concerned for TIA and delirium, MRI showed no acute finding, old lacunar infarct.    Patient appears improved with mentation this morning, more quick to respond, oriented to person/place, follows commands appropriately, denied any chest pain, SOB. She appears weak.    Vital Signs .    Vitals:   07/31/23 1717 07/31/23 2141 08/01/23 0422 08/01/23 0817  BP: (!) 158/68 (!) 133/58 (!) 155/81 (!) 162/76  Pulse: 70 69 61 64  Resp: 14   16  Temp:   (!) 97.4 F (36.3 C) (!) 97.5 F (36.4 C)  TempSrc: Oral  Axillary Oral  SpO2: 98%  92% 97%  Weight:   52.5 kg   Height:        Intake/Output Summary (Last 24 hours) at 08/01/2023 0834 Last data filed at 08/01/2023 0429 Gross per 24 hour  Intake 1536.81 ml  Output 450 ml  Net 1086.81 ml      08/01/2023    4:22 AM 07/31/2023    4:03 AM 07/27/2023    1:10 AM  Last 3 Weights  Weight (lbs) 115 lb 11.9 oz 118 lb 2.7 oz 123 lb 0.3 oz  Weight (kg) 52.5 kg 53.6 kg 55.8 kg      Telemetry/ECG    Sinus rhythm on telemetry  -  Personally Reviewed  Physical Exam .   GEN: Alert and oriented to person/place, no acute distress  Neck: No JVD Cardiac: RRR, no murmurs, rubs, or gallops.  Respiratory: Clear to auscultation bilaterally. On room air. Chest tube with serosanguinous drainage  GI: Soft, nontender, non-distended  MS: No leg edema, compression stocking in place  Right radial site with dressing, no bleeding or hematoma, pulse 2+  Assessment & Plan .     Chest pain due to pericarditis  Small pericardial effusion  Right pleural effusion  - history of dyspnea and chest pain on exertion,  nuclear stress test 12/2022 showed a partially redistributing anterior wall defect without ischemia. Cardiac catheterization was discussed at that time but patient preferred to proceed with medical management -  7/31 presents with progressive dyspnea on exertion as well as chest heaviness/ tightness that is worse with exertion but also worse when coughing - EKG showed diffuse STE - High-sensitivity troponin 30 >> 34 >> 38 >> 42 >> 41>>26 not consistent with ACS  - CRP elevated 15.6, POA - Echo 8/1 showed LVEF 50-55%, distal septal apical hypokinesis, normal RV, small pericardial effsuion, mild MR, severe MAC, mild to moderate TR, aortic sclerosis - R/L heart cath 8/5 showed no evidence of CAD, normal pressures  -  chest pain is due to pericarditis, etiology likely viral versus underlying connective tissue disease, pleural fluid exudative with negative cytology from 8/1,  no clinical evidence of cardiac tamponade, started on colchicine 0.6mg  daily, chest pain has resolved, plan to continue 3 month  - husband states they would like to continue cardiology care with Dr Bjorn Pippin, will arrange follow up appointment when she is near discharge at [facility] office   Newly diagnosed paroxysmal A fib  - TSH <0.01 on 07/26/23, FT4 0.98, known hyperthyroidism and on methimazole, followed by primary - currently on metoprolol 25mg  BID and amiodarone 200mg   BID, remains in sinus rhythm, on IV heparin gtt, recommend ultimate transition to DOAC at time of discharge when chest tube removed   Acute metabolic encephalopathy -  increasingly lethargic with right sided weakness 07/31/23 morning, code stroke was called, CT stroke study showed no acute cortically based infarct or acute intracranial hemorrhage identified. Chronic white matter disease and possible new small right cerebellar infarct since last year. Neuro consulted, felt presentation is consistent with delirium, less likely TIA/seizure,  right sided weakness had resolved, lethargy is much improved today - MRI showed no acute finding but old lacunar infarct in right cerebellar hemisphere - defer further management to neuro /primary team   Chronic diastolic heart failure  Mitral valve disease  - previous echo obtained at [facility] showed moderate MS and mild MR  - Echo 07/26/23 here showed LVEF 50-55%, mild LVH, normal RV, small pericardial effusion, mild MR, severe MAC, mild to moderate TR, trivial AI, aortic sclerosis  - clinically she appears euvolemic, no indication for IV loop diuretic at this time.  Can start PO lasix 20 mg daily - continue current regimen irbesartan, metoprolol, spironolactone for HTN   - routine outpatient Echo   HTN - BP elevated up to 170 systolic, will increase  irbesartan from 75 to 150mg  daily today, continue, metoprolol 25mg  BID, spironolactone 12.5mg  daily; may hold for permissive HTN if this is recommended by neurology   Pleural effusion Hyperthyroidism Sjogren's syndrome Rheumatoid arthritis Chronic back pain Lewy-body dementia Hyperlipidemia  Depression - per primary team     For questions or updates, please contact [facility] Please consult [email or web address] for contact info under   Signed, Cyndi Bender, NP     Patient seen and examined.  Agree with above documentation.  On exam, patient is somnolent but arousable, oriented to person and place,  regular rate and rhythm, no murmurs, lungs CTAB, no LE edema.  Maintaining sinus rhythm on amiodarone.  Appears euvolemic, she is off IV Lasix, will add p.o. Lasix 20 mg daily.  Continue colchicine for pericarditis.  Little Ishikawa, MD

## 2023-08-01 NOTE — Procedures (Signed)
Patient Name: Kristen Hansen  MRN: [id number]  Epilepsy Attending: Charlsie Quest  Referring Physician/Provider: Meryl Dare, MD  Date: 06/30/2023 Duration: 22.43 mins  Patient history: 77yo f getting eeg to evaluate for seizure  Level of alertness: Awake  AEDs during EEG study: LTG, OXC  Technical aspects: This EEG study was done with scalp electrodes positioned according to the 10-20 International system of electrode placement. Electrical activity was reviewed with band pass filter of 1-70Hz , sensitivity of 7 uV/mm, display speed of 68mm/sec with a 60Hz  notched filter applied as appropriate. EEG data were recorded continuously and digitally stored.  Video monitoring was available and reviewed as appropriate.  Description: EEG showed continuous generalized 3 to 6 Hz theta-delta slowing. Hyperventilation and photic stimulation were not performed.     ABNORMALITY - Continuous slow, generalized  IMPRESSION: This study is suggestive of moderate diffuse encephalopathy, nonspecific etiology. No seizures or epileptiform discharges were seen throughout the recording.   Annabelle Harman

## 2023-08-02 DIAGNOSIS — I300 Acute nonspecific idiopathic pericarditis: Secondary | ICD-10-CM | POA: Diagnosis not present

## 2023-08-02 DIAGNOSIS — I480 Paroxysmal atrial fibrillation: Secondary | ICD-10-CM | POA: Diagnosis not present

## 2023-08-02 DIAGNOSIS — J90 Pleural effusion, not elsewhere classified: Secondary | ICD-10-CM | POA: Diagnosis not present

## 2023-08-02 DIAGNOSIS — I5032 Chronic diastolic (congestive) heart failure: Secondary | ICD-10-CM | POA: Diagnosis not present

## 2023-08-02 MED ORDER — POTASSIUM CHLORIDE CRYS ER 20 MEQ PO TBCR
40.0000 meq | EXTENDED_RELEASE_TABLET | Freq: Once | ORAL | Status: AC
  Administered 2023-08-02: 40 meq via ORAL
  Filled 2023-08-02: qty 2

## 2023-08-02 NOTE — Progress Notes (Signed)
ANTICOAGULATION CONSULT NOTE  Pharmacy Consult for Heparin Indication: atrial fibrillation  Allergies  Allergen Reactions   Morphine And Codeine Hives   Prevacid [Lansoprazole] Other (See Comments)    unknown   Provigil [Modafinil] Other (See Comments)    dizziness   Adhesive [Tape] Rash    bandaids     Patient Measurements: Height: 5\' 10"  (177.8 cm) Weight: 52.9 kg (116 lb 10 oz) IBW/kg (Calculated) : 68.5 Heparin Dosing Weight: 56 kg  Vital Signs: Temp: 97.6 F (36.4 C) (08/08 0820) Temp Source: Oral (08/08 0820) BP: 147/65 (08/08 0445) Pulse Rate: 59 (08/08 0445)  Labs: Recent Labs    07/31/23 0714 07/31/23 2141 08/01/23 0638 08/01/23 1739 08/02/23 0149  HGB 12.2  --  9.8*  --  9.3*  HCT 37.7  --  30.9*  --  28.2*  PLT 307  --  266  --  279  HEPARINUNFRC  --    < > <0.10* 0.48 0.47  CREATININE 1.15*  --  0.88  --  1.23*   < > = values in this interval not displayed.    Estimated Creatinine Clearance: 32.5 mL/min (A) (by C-G formula based on SCr of 1.23 mg/dL (H)).  Assessment: Patient is a 89 yof admitted for chest pain and new onset afib. Patient found to have pericardial effusion/pericarditis. She had thoracentesis 8/1 with 1.3L removed and now s/p cath 8/5. Code stroke called 8/5 and CT and MRI negative for acute stroke. Plans for oral anticoagulation after the chest tube is removed  Heparin level is 0.47 (therapeutic) on 1500 units/hr.     Goal of Therapy:  Heparin level 0.3-0.7 units/ml Monitor platelets by anticoagulation protocol: Yes   Plan:   Continue Heparin infusion at 1500 units/hr Daily heparin level and CBC  Harland German, PharmD Clinical Pharmacist **Pharmacist phone directory can now be found on [email or web address] ([email or web address]).  Listed under [facility].

## 2023-08-02 NOTE — Progress Notes (Signed)
Physical Therapy Treatment Patient Details Name: Kristen Hansen MRN: [id number] DOB: March 02, 1946 Today's Date: 08/02/2023   History of Present Illness Pt is 77 yo female presenting [facility] ED with chest pain. Chest pain has been moving and is increased with palpation.  Pt underwent on 8/1 thoracentesis 1.3 L   and on 8/4 chest tube placement.   PMH: HTN, hyperlipidemia, Lewy body dementia, Sjogren's syndrome, rheumatoid arthritis, chronic pain, DVT, Spine disease, spinal stenosis, diastolic CHF, depression, REM behavior disorder, orthostatic Hypotension, CKD 3, GERD, hypothrydoidism.    PT Comments  Pt admitted with above diagnosis. Pt progressing slowly. May need to update d/c plan if husband cannot care for pt at home as pt may need post acute rehab < 3hours day. Pt needs mod assist for most mobility at present.   Pt currently with functional limitations due to the deficits listed below (see PT Problem List). Pt will benefit from acute skilled PT to increase their independence and safety with mobility to allow discharge.       If plan is discharge home, recommend the following: A little help with walking and/or transfers;Assistance with cooking/housework;Assist for transportation;Help with stairs or ramp for entrance   Can travel by private vehicle        Equipment Recommendations  None recommended by PT    Recommendations for Other Services       Precautions / Restrictions Precautions Precautions: Fall Precaution Comments: watch BP, chest tube Restrictions Weight Bearing Restrictions: No     Mobility  Bed Mobility Overal bed mobility: Needs Assistance Bed Mobility: Rolling Rolling: Mod assist   Supine to sit: Mod assist     General bed mobility comments: use of rail, rolled several times for pericare, linen change as she was soiled with urine and BM    Transfers Overall transfer level: Needs assistance Equipment used: Rolling walker (2 wheels) Transfers: Sit to/from Stand,  Bed to chair/wheelchair/BSC Sit to Stand: Mod assist   Step pivot transfers: Mod assist       General transfer comment: Mod assist needed to RW with cues for hand placement.  Pt leaning posteriorly on heels needing  verbala nd tactile cues to lean forward.  Needed mod assist to step pivot to the chair with physical  assist and verbal cues to lean forward and stand tall with cues for sequencing and needed assist to move RW as well.    Ambulation/Gait                   Stairs             Wheelchair Mobility     Tilt Bed    Modified Rankin (Stroke Patients Only)       Balance Overall balance assessment: Needs assistance Sitting-balance support: Single extremity supported, No upper extremity supported, Feet supported Sitting balance-Leahy Scale: Fair Sitting balance - Comments: supervision for sitting balance   Standing balance support: Bilateral upper extremity supported Standing balance-Leahy Scale: Poor Standing balance comment: relies on RW with heavy posterior lean onto heels                            Cognition Arousal: Alert Behavior During Therapy: Flat affect Overall Cognitive Status: History of cognitive impairments - at baseline  Exercises General Exercises - Lower Extremity Ankle Circles/Pumps: AAROM, 5 reps Long Arc Quad: AAROM, 10 reps    General Comments        Pertinent Vitals/Pain Pain Assessment Pain Assessment: Faces Faces Pain Scale: Hurts little more Pain Location: chest tube site Pain Descriptors / Indicators: Guarding, Grimacing Pain Intervention(s): Limited activity within patient's tolerance, Monitored during session, Repositioned    Home Living                          Prior Function            PT Goals (current goals can now be found in the care plan section) Acute Rehab PT Goals Patient Stated Goal: none reported Progress towards PT  goals: Progressing toward goals    Frequency    Min 1X/week      PT Plan Current plan remains appropriate    Co-evaluation              AM-PAC PT "6 Clicks" Mobility   Outcome Measure  Help needed turning from your back to your side while in a flat bed without using bedrails?: A Little Help needed moving from lying on your back to sitting on the side of a flat bed without using bedrails?: A Little Help needed moving to and from a bed to a chair (including a wheelchair)?: A Lot Help needed standing up from a chair using your arms (e.g., wheelchair or bedside chair)?: A Lot Help needed to walk in hospital room?: Total Help needed climbing 3-5 steps with a railing? : Total 6 Click Score: 12    End of Session Equipment Utilized During Treatment: Gait belt Activity Tolerance: Patient limited by fatigue Patient left: with call bell/phone within reach;with family/visitor present;in chair;with chair alarm set Nurse Communication: Mobility status;Need for lift equipment (use Stedy) PT Visit Diagnosis: Unsteadiness on feet (R26.81);Other abnormalities of gait and mobility (R26.89)     Time: 6010-9323 PT Time Calculation (min) (ACUTE ONLY): 35 min  Charges:    $Therapeutic Exercise: 8-22 mins $Therapeutic Activity: 8-22 mins PT General Charges $$ ACUTE PT VISIT: 1 Visit                      M,PT Acute Rehab Services [phone]    Bevelyn Buckles 08/02/2023, 2:28 PM

## 2023-08-02 NOTE — Progress Notes (Addendum)
Rounding Note    Patient Name: Kristen Hansen Date of Encounter: 08/02/2023  [facility] Cardiologist: [facility]  Subjective   Denies any CP or SOB. Good appetite, eating breakfast. Able to carry a slow but normal conversation. Husband at bedside.   Inpatient Medications    Scheduled Meds:  amiodarone  200 mg Oral BID   aspirin  81 mg Oral Daily   colchicine  0.6 mg Oral Daily   irbesartan  150 mg Oral Daily   lamoTRIgine  150 mg Oral Daily   methIMAzole  10 mg Oral Daily   metoprolol tartrate  25 mg Oral BID   montelukast  10 mg Oral QHS   Oxcarbazepine  300 mg Oral BID   pantoprazole  40 mg Oral Daily   potassium chloride  40 mEq Oral Once   pravastatin  40 mg Oral QAC breakfast   pyridostigmine  60 mg Oral Q8H   sertraline  50 mg Oral Daily   sodium chloride flush  3 mL Intravenous Q12H   sodium chloride flush  3 mL Intravenous Q12H   spironolactone  12.5 mg Oral Daily   Continuous Infusions:  sodium chloride     sodium chloride 100 mL/hr at 08/01/23 2011   heparin 1,500 Units/hr (08/02/23 0118)   piperacillin-tazobactam (ZOSYN)  IV 3.375 g (08/02/23 0117)   PRN Meds: sodium chloride, acetaminophen **OR** acetaminophen, diltiazem, loperamide, nitroGLYCERIN, polyethylene glycol, sodium chloride flush, valACYclovir   Vital Signs    Vitals:   08/01/23 1619 08/01/23 1958 08/01/23 2343 08/02/23 0445  BP: (!) 114/56 (!) 119/52 134/60 (!) 147/65  Pulse: (!) 58 62 62 (!) 59  Resp: 16 17 16 12   Temp: (!) 97.5 F (36.4 C) 98.8 F (37.1 C) 98.5 F (36.9 C) 97.8 F (36.6 C)  TempSrc: Axillary Oral Oral Oral  SpO2: 98% 98% 99% 99%  Weight:    52.9 kg  Height:        Intake/Output Summary (Last 24 hours) at 08/02/2023 0809 Last data filed at 08/02/2023 0600 Gross per 24 hour  Intake 2673.84 ml  Output 1020 ml  Net 1653.84 ml      08/02/2023    4:45 AM 08/01/2023    4:22 AM 07/31/2023    4:03 AM  Last 3 Weights  Weight (lbs) 116 lb 10 oz 115 lb 11.9 oz 118  lb 2.7 oz  Weight (kg) 52.9 kg 52.5 kg 53.6 kg      Telemetry    NSR with HR around 60 bpm - Personally Reviewed  ECG    NSR without significant ST-T wave changes - Personally Reviewed  Physical Exam   GEN: No acute distress.   Neck: No JVD Cardiac: RRR, no murmurs, rubs, or gallops.  Respiratory: Clear to auscultation bilaterally. GI: Soft, nontender, non-distended  MS: No edema; No deformity. Neuro:  Nonfocal  Psych: Normal affect   Labs    High Sensitivity Troponin:   Recent Labs  Lab 07/25/23 0443 07/25/23 0958 07/25/23 1533 07/25/23 1745 07/27/23 0852  TROPONINIHS 34* 38* 42* 41* 26*     Chemistry Recent Labs  Lab 07/31/23 0714 08/01/23 0638 08/02/23 0149  NA 130* 132* 132*  K 4.6 3.4* 3.3*  CL 94* 100 102  CO2 21* 19* 21*  GLUCOSE 73 71 89  BUN 17 13 11   CREATININE 1.15* 0.88 1.23*  CALCIUM 8.9 8.1* 7.7*  MG 1.7 1.6* 1.9  PROT 6.1* 4.9* 4.7*  ALBUMIN 2.5* 1.9* 1.9*  AST 43*  26 31  ALT 27 22 23   ALKPHOS 164* 141* 131*  BILITOT 0.4 0.5 0.3  GFRNONAA 49* >60 46*  ANIONGAP 15 13 9     Lipids No results for input(s): "CHOL", "TRIG", "HDL", "LABVLDL", "LDLCALC", "CHOLHDL" in the last 168 hours.  Hematology Recent Labs  Lab 07/31/23 0714 08/01/23 0638 08/02/23 0149  WBC 14.1* 6.7 6.9  RBC 4.60 3.74* 3.40*  HGB 12.2 9.8* 9.3*  HCT 37.7 30.9* 28.2*  MCV 82.0 82.6 82.9  MCH 26.5 26.2 27.4  MCHC 32.4 31.7 33.0  RDW 14.2 14.0 14.0  PLT 307 266 279   Thyroid No results for input(s): "TSH", "FREET4" in the last 168 hours.  BNP Recent Labs  Lab 07/31/23 0714 08/01/23 0638 08/02/23 0149  BNP 356.1* 664.7* 744.2*    DDimer No results for input(s): "DDIMER" in the last 168 hours.   Radiology    EEG adult  Result Date: 08/01/2023 Charlsie Quest, MD     08/01/2023  8:47 AM Patient Name: Kristen Hansen MRN: [id number] Epilepsy Attending: Charlsie Quest Referring Physician/Provider: Meryl Dare, MD Date: 06/30/2023 Duration: 22.43 mins  Patient history: 77yo f getting eeg to evaluate for seizure Level of alertness: Awake AEDs during EEG study: LTG, OXC Technical aspects: This EEG study was done with scalp electrodes positioned according to the 10-20 International system of electrode placement. Electrical activity was reviewed with band pass filter of 1-70Hz , sensitivity of 7 uV/mm, display speed of 59mm/sec with a 60Hz  notched filter applied as appropriate. EEG data were recorded continuously and digitally stored.  Video monitoring was available and reviewed as appropriate. Description: EEG showed continuous generalized 3 to 6 Hz theta-delta slowing. Hyperventilation and photic stimulation were not performed.   ABNORMALITY - Continuous slow, generalized IMPRESSION: This study is suggestive of moderate diffuse encephalopathy, nonspecific etiology. No seizures or epileptiform discharges were seen throughout the recording. Charlsie Quest   CT CHEST WO CONTRAST  Result Date: 07/31/2023 CLINICAL DATA:  Pneumonia, pleural effusion suspected (Ped 0-17y) evaluate for pleural pathology in right lower hemithorax EXAM: CT CHEST WITHOUT CONTRAST TECHNIQUE: Multidetector CT imaging of the chest was performed following the standard protocol without IV contrast. RADIATION DOSE REDUCTION: This exam was performed according to the departmental dose-optimization program which includes automated exposure control, adjustment of the mA and/or kV according to patient size and/or use of iterative reconstruction technique. COMPARISON:  07/25/2023.  Chest x-ray today. FINDINGS: Cardiovascular: Heart is normal size. Coronary artery and aortic calcifications. Aorta normal caliber. Mediastinum/Nodes: No mediastinal, hilar, or axillary adenopathy. Trachea and esophagus are unremarkable. Thyroid unremarkable. Lungs/Pleura: Right pigtail pleural catheter in place with decreasing right pleural effusion. Small right pleural effusion remains with locules of gas throughout the  pleural space. Bilateral lower lobe nodular ground-glass airspace opacities, right greater than left. Upper Abdomen: Left hydronephrosis noted with contrast in the collecting system from prior CTA head/neck. There is a large left UPJ stone measuring 1.8 x 1.0 cm. Multiple mid and lower pole left renal stones. Musculoskeletal: Bilateral breast implants, grossly unremarkable. No acute bony abnormality. IMPRESSION: Decreasing right pleural effusion following placement of posterior pigtail pleural catheter. Small right pleural effusion and locules of pleural gas noted currently. Patchy bilateral ground-glass airspace disease, right greater than left. Cannot exclude pneumonia. 1.8 x 1.0 cm left UPJ stone with left hydronephrosis. Coronary artery disease. Aortic Atherosclerosis (ICD10-I70.0). Electronically Signed   By: Charlett Nose M.D.   On: 07/31/2023 17:03   MR BRAIN WO CONTRAST  Result Date:  07/31/2023 CLINICAL DATA:  Stroke, follow up rule out CVA, L side weakness previously reported. EXAM: MRI HEAD WITHOUT CONTRAST TECHNIQUE: Multiplanar, multiecho pulse sequences of the brain and surrounding structures were obtained without intravenous contrast. COMPARISON:  Head CT and CTA head/neck 07/31/2023. MRI brain 01/24/2021. FINDINGS: Brain: No acute infarct or hemorrhage. Mild chronic small-vessel disease with old lacunar infarct in the right cerebellar hemisphere. No hydrocephalus or extra-axial collection. No mass or midline shift. Vascular: Normal flow voids. Skull and upper cervical spine: Normal marrow signal. Sinuses/Orbits: Unremarkable. Other: None. IMPRESSION: 1. No acute intracranial process. 2. Mild chronic small-vessel disease with old lacunar infarct in the right cerebellar hemisphere. Electronically Signed   By: Orvan Falconer M.D.   On: 07/31/2023 17:01   CT ANGIO HEAD NECK W WO CM  Result Date: 07/31/2023 CLINICAL DATA:  Stroke, follow-up.  Rule out stroke. EXAM: CT ANGIOGRAPHY HEAD AND NECK WITH  AND WITHOUT CONTRAST TECHNIQUE: Multidetector CT imaging of the head and neck was performed using the standard protocol during bolus administration of intravenous contrast. Multiplanar CT image reconstructions and MIPs were obtained to evaluate the vascular anatomy. Carotid stenosis measurements (when applicable) are obtained utilizing NASCET criteria, using the distal internal carotid diameter as the denominator. RADIATION DOSE REDUCTION: This exam was performed according to the departmental dose-optimization program which includes automated exposure control, adjustment of the mA and/or kV according to patient size and/or use of iterative reconstruction technique. CONTRAST:  60mL OMNIPAQUE IOHEXOL 350 MG/ML SOLN COMPARISON:  Head CT 07/31/2023. FINDINGS: CTA NECK FINDINGS Aortic arch: Three-vessel arch configuration. Arch vessel origins are patent. Right carotid system: No evidence of dissection, stenosis (50% or greater), or occlusion. Left carotid system: No evidence of dissection, stenosis (50% or greater), or occlusion. Vertebral arteries: Codominant. No evidence of dissection, stenosis (50% or greater), or occlusion. Skeleton: Negative. Other neck: Negative. Upper chest: Partially imaged right pleural effusion with gas from chest tube placement, better evaluated on same day chest CT. Review of the MIP images confirms the above findings CTA HEAD FINDINGS Anterior circulation: Calcified plaque along the carotid siphons without hemodynamically significant stenosis. The proximal ACAs and MCAs are patent without stenosis or aneurysm. Distal branches are symmetric. Posterior circulation: Normal basilar artery. The SCAs, AICAs and PICAs are patent proximally. The PCAs are patent proximally without stenosis or aneurysm. Distal branches are symmetric. Venous sinuses: Early phase of contrast. Anatomic variants: Persistent fetal origin of the right PCA with hypoplastic right P1 segment. Review of the MIP images confirms  the above findings IMPRESSION: 1. No large vessel occlusion, hemodynamically significant stenosis, or aneurysm in the head or neck. 2. Partially imaged right pleural effusion with gas from chest tube placement, better evaluated on same day chest CT. Electronically Signed   By: Orvan Falconer M.D.   On: 07/31/2023 16:42   US Abdomen Limited RUQ (LIVER/GB)  Result Date: 07/31/2023 CLINICAL DATA:  Right upper quadrant pain.  Ascites EXAM: ULTRASOUND ABDOMEN LIMITED RIGHT UPPER QUADRANT COMPARISON:  None Available. FINDINGS: Gallbladder: Distended gallbladder. Gallbladder is folded. No shadowing stones or adjacent fluid. Common bile duct: Diameter: 3 mm Liver: No focal lesion identified. Within normal limits in parenchymal echogenicity. Portal vein is patent on color Doppler imaging with normal direction of blood flow towards the liver. Other: Mild ascites identified along the abdomen. Right-sided pleural effusion IMPRESSION: No gallstones or ductal dilatation. Mild ascites. Right-sided small pleural effusion Electronically Signed   By: Karen Kays M.D.   On: 07/31/2023 15:17    Cardiac  Studies    Cath 07/30/2023 No angiographic evidence of CAD Normal right and left heart pressures (RA 7, RV 40/4/8, PA 44/12 mean 22, PCWP 11)    Recommendations: No further ischemic workup.   Patient Profile     77 y.o. female with PMH of RA, lewy-body dementia, prior DVT, Sjogren's syndrome, chronic diastolic CHF, mitral stenosis and hypertension who presented with chest pain. Chest pain felt to be pericarditis. Underwent cath 8/5 which showed normal cors. Code stroke on 8/6 due to R arms weakness and somnolence.   Assessment & Plan    Chest pain             - abnormal myoview in Jan 2024, cardiac cath discussed at the time, but patient preferred medical management.              - presented on 7/31 with intermittent chest pain. BNP elevated at 1109. Serial borderline borderline elevated but flat. CTA negative for  PE, but shows a large R pleural effusion             - initial clinical picture concerning for pericarditis. CRP elevated. Given colchicine.              - TTE showed small pericardial effusion with RWMA in septum and apex             - Cath 07/30/2023 showed normal coronary arteries, normal left and right heart pressure.  - continued on colchicine   Pericarditis:  on colchicine   New onset atrial fibrillation: mainitaining NSR on amiodarone. On IV heparin, will need DOAC once chest tube is out.    R pleural effusion: s/p thoracentesis with removal of 1.3 L of fluid, s/p chest tube, had 1.4 L additional output overnight   Acute metabolic encephalopathy: increased lethargy and R sided weakness on 8/6, code stroke activated. Initial CT showed no acute process. Neuro consulted, suspect metabolic encephalopathy, less likely TIA or seizure. 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 EEG 8/7 showed moderate diffuse encephalopathy, nonspecific etiology. No seizures.   Chronic diastolic CHF: euvolemic on exam. Currently getting IVF, recommend holding off on fluids given her HFpEF   Moderate MS: previous echo obtained at [facility] showed moderate MS and mild MR   Hypertension      For questions or updates, please contact [facility] Please consult [email or web address] for contact info under        Signed, Azalee Course, PA  08/02/2023, 8:09 AM     Patient seen and examined.  Agree with above documentation.  On exam, patient is alert and oriented, regular rate and rhythm, no murmurs, lungs CTAB, no LE edema.  Appears euvolemic but with her HFpEF, would avoid further IVF.  Chest tube still in, would continue heparin for now, can transition to DOAC once chest tube is out.  Will plan amio taper for afib.  Little Ishikawa, MD

## 2023-08-02 NOTE — Plan of Care (Signed)
  Problem: Education: Goal: Knowledge of General Education information will improve Description: Including pain rating scale, medication(s)/side effects and non-pharmacologic comfort measures Outcome: Progressing   Problem: Health Behavior/Discharge Planning: Goal: Ability to manage health-related needs will improve Outcome: Progressing   Problem: Clinical Measurements: Goal: Respiratory complications will improve Outcome: Progressing   Problem: Clinical Measurements: Goal: Cardiovascular complication will be avoided Outcome: Progressing   Problem: Activity: Goal: Risk for activity intolerance will decrease Outcome: Progressing   Problem: Nutrition: Goal: Adequate nutrition will be maintained Outcome: Progressing   Problem: Coping: Goal: Level of anxiety will decrease Outcome: Progressing   Problem: Pain Managment: Goal: General experience of comfort will improve Outcome: Progressing   Problem: Safety: Goal: Ability to remain free from injury will improve Outcome: Progressing   Problem: Skin Integrity: Goal: Risk for impaired skin integrity will decrease Outcome: Progressing   Problem: Activity: Goal: Ability to tolerate increased activity will improve Outcome: Progressing   Problem: Cardiovascular: Goal: Ability to achieve and maintain adequate cardiovascular perfusion will improve Outcome: Progressing Goal: Vascular access site(s) Level 0-1 will be maintained Outcome: Progressing

## 2023-08-02 NOTE — Progress Notes (Signed)
PROGRESS NOTE    Kristen Hansen  ZOX:[id number] DOB: 1946/12/07 DOA: 07/25/2023 PCP: Kerin Salen, PA-C   Brief Narrative:  Kristen Hansen is a 77 y.o. female with medical history significant of hypertension, hyperlipidemia, Lewy body dementia, Sjogren's syndrome, rheumatoid arthritis, chronic pain, DVT, spinal stenosis, diastolic CHF, depression, REM behavior disorder, orthostatic hypotension, CKD 3, GERD, hyperthyroidism presenting with chest pain. Her chest pain has atypical nature and present for 2 weeks intermittently. She was found to have a moderate pleural effusion. Cardiology consulted, s/p cardiac cath with clean coronaries.  Details below.  Assessment & Plan:   Principal Problem:   Pleural effusion Active Problems:   Rheumatoid arthritis (HCC)   HTN (hypertension)   Chronic bilateral low back pain without sciatica   Depression   Lewy body dementia without behavioral disturbance, psychotic disturbance, mood disturbance, or anxiety (HCC)   Spinal stenosis of lumbar region   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)   Chronic heart failure with preserved ejection fraction (HCC)   CKD (chronic kidney disease) stage 3, GFR 30-59 ml/min (HCC)   Gastroesophageal reflux disease with esophagitis   Mixed hyperlipidemia   Chest pain   Sjogren's syndrome (HCC)   Acute idiopathic pericarditis   PAF (paroxysmal atrial fibrillation) (HCC)   Need for management of chest tube   Chest Pain, exudative and recurrent right-sided pleural Effusion, Pericardial Effusion - Chest pain improved after thoracentesis. Given re-accumulation of fluid and exudative nature, PCCM was consulted. Appreciate their assistance. They placed a chest tube and are managing that.  Still with chest tube to suction.  At discharge, will have her follow-up with pulmonary outpatient along with rheumatology.   Acute on chronic diastolic CHF EF 50 to 55%, and some preceding history of  exertional shortness of breath.   Pt had episode of worsening dyspnea on morning of 08/04 with coughing, CXR obtained showed volume overload. She was given IV lasix with improvement in her symptoms.  Cardiology on board.  Underwent cath which showed clean coronaries.  Has been started on Lasix 20 mg p.o. daily on 08/01/2023.   Pericardial Effusion She has mild pericarditis and is currently on colchicine for pericardial effusion along with aspirin per cardiology.   Paroxysmal Atrial Fibrillation: Cardiology following, rates controlled, on amiodarone and metoprolol oral as well as heparin drip, will switch to DOAC once chest tube is out.  Dysphagia: OP work up ongoing. EGD without any acute findings. Getting swallowing therapy. SLP following pt. Repeat MBBS shows pt has some retention of contrast and some aspiration concern but overall unchanged from prior study and likely secondary to progressing lewy body dementia. Placed on Dys 3 Diet here. Will need follow up with palliative care OP (see below).    Lewy Body Dementia: On pyridostigmine. Last saw neurologist one month ago. Appears to be progressing and pt may benefit from palliative care consult (see below).   Acute encephalopathy/delirium: Resolved as currently she is fully alert and oriented to person place and year but not month.  Husband at the bedside verifies that this is her baseline.   GOC: Pt with lewey body dementia and multiple co-morbidities and worsening dysphagia. She may benefit from OP palliative care consult.    Orthostatic Hypotension: Pt with orthostatic hypotension. Suspect this is secondary to her lewy body dementia.  Continue TED hose.  Would encourage her to change positions slowly. Per husband, she has some dizziness at home.    Hyponatremia: Na 132, stable.  Hypomagnesemia/Hypokalemia: Mag is normal but potassium low.  Will replace.   Hx of DVT: D dimer elevated,  CTA negative, lower extremity doppler which was  negative for DVT.  Now on heparin drip for A-fib.     Hyperthyroidism: Recently started on methimazole. States she is complaint with this. Will continue here. FT4 normal and FT3 slightly decreased at 1.7. Thyroid ultrasound ordered and shows bilateral nodules that do not meet criteria for biopsy for dedicated follow up.  Will need OP endocrinology follow up.    Left Shoulder Pain: Has good PROM but pain with active ROM. Concern for RT injury. 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 Conservative measures for now.  Overall better, outpatient orthopedics follow-up postdischarge. PCP to arrange.   HTN:  Home meds: amlodipine 5 mg every day, irbesartan 300 mg every day, lopressor 25 mg BID.  Patient currently on Aldactone, irbesartan, metoprolol.  Blood pressure fairly controlled.   GERD: Continue home med protonix. Does not endorse symptoms of reflux.    Sjogren's Syndrome: Not on any pharmacotherapy.  Likely causing pericardial effusion and exudative right-sided pleural effusion, currently on colchicine for pericardial effusion may require outpatient rheumatology follow-up postdischarge.   RA: Was on enbrel. Last used 2 months ago.    Chronic back pain: Tylenol prn   MDD: Zoloft 50 mg every day. Continued here.    Trigeminal Neuralgia: On lamictal and trileptal. Both continued here.  Code stroke/ ? TIA: Patient was noted to have right-sided weakness by RN at around 6:30 AM. LKN was at about 9:00 PM last night when RN administered her meds. The patient was drowsy and poorly cooperative at that time, which were attributed to a recent Klonopin dose .  Code stroke was called, seen by neurology.  Underwent CT head, CTA head and neck and MRI brain and was ruled out of stroke.  EEG negative as well.  Patient back at her baseline mental status.  Neurology signed off.    Obesity: Estimated body mass index is 17.65 kg/m as calculated from the following:   Height as of this encounter: 5\' 10"  (1.778 m).   Weight as of this encounter: 55.8 kg.   DVT prophylaxis: Place TED hose Start: 07/27/23 1141   Code Status: Full Code  Family Communication: Husband present at bedside.   Status is: Inpatient Remains inpatient appropriate because: Patient with chest tube   Estimated body mass index is 16.73 kg/m as calculated from the following:   Height as of this encounter: 5\' 10"  (1.778 m).   Weight as of this encounter: 52.9 kg.  Pressure Injury 08/01/23 Sacrum Medial Deep Tissue Pressure Injury - Purple or maroon localized area of discolored intact skin or blood-filled blister due to damage of underlying soft tissue from pressure and/or shear. (Active)  08/01/23 0000  Location: Sacrum  Location Orientation: Medial  Staging: Deep Tissue Pressure Injury - Purple or maroon localized area of discolored intact skin or blood-filled blister due to damage of underlying soft tissue from pressure and/or shear.  Wound Description (Comments):   Present on Admission:   Dressing Type Foam - Lift dressing to assess site every shift 08/01/23 2000   Nutritional Assessment: Body mass index is 16.73 kg/m.Marland Kitchen Seen by dietician.  I agree with the assessment and plan as outlined below: Nutrition Status:        . Skin Assessment: I have examined the  patient's skin and I agree with the wound assessment as performed by the wound care RN as outlined below: Pressure Injury 08/01/23 Sacrum Medial Deep Tissue Pressure Injury - Purple or maroon localized area of discolored intact skin or blood-filled blister due to damage of underlying soft tissue from pressure and/or shear. (Active)  08/01/23 0000  Location: Sacrum  Location Orientation: Medial  Staging: Deep Tissue Pressure Injury - Purple or maroon localized area of discolored intact skin or blood-filled blister due to damage of  underlying soft tissue from pressure and/or shear.  Wound Description (Comments):   Present on Admission:   Dressing Type Foam - Lift dressing to assess site every shift 08/01/23 2000    Consultants:  Neurology, critical care and cardiology  Procedures:  As above  Antimicrobials:  Anti-infectives (From admission, onward)    Start     Dose/Rate Route Frequency Ordered Stop   08/01/23 1600  piperacillin-tazobactam (ZOSYN) IVPB 3.375 g        3.375 g 12.5 mL/hr over 240 Minutes Intravenous Every 8 hours 08/01/23 0843     08/01/23 0930  piperacillin-tazobactam (ZOSYN) IVPB 3.375 g        3.375 g 100 mL/hr over 30 Minutes Intravenous  Once 08/01/23 0843 08/01/23 1042   07/26/23 0540  valACYclovir (VALTREX) tablet 1,000 mg        1,000 mg Oral Every 12 hours PRN 07/26/23 0541           Subjective: Patient seen and examined.  She was talking on the phone with her best friend.  She was fully alert and oriented and had some humor today as well.  Denied any complaints.  Husband at the bedside.  Objective: Vitals:   08/01/23 1619 08/01/23 1958 08/01/23 2343 08/02/23 0445  BP: (!) 114/56 (!) 119/52 134/60 (!) 147/65  Pulse: (!) 58 62 62 (!) 59  Resp: 16 17 16 12   Temp: (!) 97.5 F (36.4 C) 98.8 F (37.1 C) 98.5 F (36.9 C) 97.8 F (36.6 C)  TempSrc: Axillary Oral Oral Oral  SpO2: 98% 98% 99% 99%  Weight:    52.9 kg  Height:        Intake/Output Summary (Last 24 hours) at 08/02/2023 0757 Last data filed at 08/02/2023 0600 Gross per 24 hour  Intake 2673.84 ml  Output 1020 ml  Net 1653.84 ml   Filed Weights   07/31/23 0403 08/01/23 0422 08/02/23 0445  Weight: 53.6 kg 52.5 kg 52.9 kg    Examination:  General exam: Appears calm and comfortable  Respiratory system: Slightly diminished breath sounds at the right base. Respiratory effort normal. Cardiovascular system: S1 & S2 heard, RRR. No JVD, murmurs, rubs, gallops or clicks. No pedal edema. Gastrointestinal system:  Abdomen is nondistended, soft and nontender. No organomegaly or masses felt. Normal bowel sounds heard. Central nervous system: Alert and oriented. No focal neurological deficits. Extremities: Symmetric 5 x 5 power. Skin: No rashes, lesions or ulcers.    Data Reviewed: I have personally reviewed following labs and imaging studies  CBC: Recent Labs  Lab 07/29/23 0327 07/30/23 0418 07/30/23 1225 07/30/23 1226 07/31/23 0714 08/01/23 0638 08/02/23 0149  WBC 8.1 9.8  --   --  14.1* 6.7 6.9  NEUTROABS 4.8 7.0  --   --  11.5* 4.8 4.7  HGB 10.0* 10.9* 9.9* 9.9* 12.2 9.8* 9.3*  HCT 31.3* 33.5* 29.0* 29.0* 37.7 30.9* 28.2*  MCV 83.2 83.3  --   --  82.0 82.6 82.9  PLT 306  263  --   --  307 266 279   Basic Metabolic Panel: Recent Labs  Lab 07/29/23 0327 07/30/23 0418 07/30/23 1225 07/30/23 1226 07/30/23 1909 07/31/23 0714 08/01/23 0638 08/02/23 0149  NA 131* 131* 133* 135  --  130* 132* 132*  K 4.3 3.8 3.2* 3.1* 3.3* 4.6 3.4* 3.3*  CL 102 94*  --   --   --  94* 100 102  CO2 21* 24  --   --   --  21* 19* 21*  GLUCOSE 95 103*  --   --   --  73 71 89  BUN 16 16  --   --   --  17 13 11   CREATININE 1.00 1.04*  --   --   --  1.15* 0.88 1.23*  CALCIUM 8.5* 8.9  --   --   --  8.9 8.1* 7.7*  MG 2.0 1.9  --   --   --  1.7 1.6* 1.9   GFR: Estimated Creatinine Clearance: 32.5 mL/min (A) (by C-G formula based on SCr of 1.23 mg/dL (H)). Liver Function Tests: Recent Labs  Lab 07/29/23 0327 07/30/23 0418 07/31/23 0714 08/01/23 0638 08/02/23 0149  AST 18 24 43* 26 31  ALT 14 17 27 22 23   ALKPHOS 121 143* 164* 141* 131*  BILITOT 0.6 0.5 0.4 0.5 0.3  PROT 5.6* 6.0* 6.1* 4.9* 4.7*  ALBUMIN 2.2* 2.4* 2.5* 1.9* 1.9*   No results for input(s): "LIPASE", "AMYLASE" in the last 168 hours. Recent Labs  Lab 07/31/23 1347  AMMONIA 21   Coagulation Profile: No results for input(s): "INR", "PROTIME" in the last 168 hours.  Cardiac Enzymes: No results for input(s): "CKTOTAL", "CKMB",  "CKMBINDEX", "TROPONINI" in the last 168 hours. BNP (last 3 results) No results for input(s): "PROBNP" in the last 8760 hours. HbA1C: No results for input(s): "HGBA1C" in the last 72 hours. CBG: Recent Labs  Lab 07/30/23 2327 07/31/23 0627  GLUCAP 97 77   Lipid Profile: No results for input(s): "CHOL", "HDL", "LDLCALC", "TRIG", "CHOLHDL", "LDLDIRECT" in the last 72 hours. Thyroid Function Tests: No results for input(s): "TSH", "T4TOTAL", "FREET4", "T3FREE", "THYROIDAB" in the last 72 hours. Anemia Panel: No results for input(s): "VITAMINB12", "FOLATE", "FERRITIN", "TIBC", "IRON", "RETICCTPCT" in the last 72 hours. Sepsis Labs: Recent Labs  Lab 07/30/23 0418 07/31/23 0902 08/01/23 0638 08/02/23 0149  PROCALCITON 0.10 0.16 0.24 0.18    Recent Results (from the past 240 hour(s))  Culture, body fluid w Gram Stain-bottle     Status: None   Collection Time: 07/26/23 12:33 PM   Specimen: Pleura  Result Value Ref Range Status   Specimen Description PLEURAL  Final   Special Requests PLEURAL  Final   Culture   Final    NO GROWTH 5 DAYS Performed at [facility], [address] [place], [place] [address]    Report Status 07/31/2023 FINAL  Final  Gram stain     Status: None   Collection Time: 07/26/23 12:33 PM   Specimen: Pleura  Result Value Ref Range Status   Specimen Description PLEURAL  Final   Special Requests NONE  Final   Gram Stain   Final    RARE WBC PRESENT,BOTH PMN AND MONONUCLEAR NO ORGANISMS SEEN Performed at [facility], [address], [place], [place] [address]    Report Status 07/26/2023 FINAL  Final  Respiratory (~20 pathogens) panel by PCR     Status: None   Collection Time: 07/27/23  12:28 PM   Specimen: Nasopharyngeal Swab; Respiratory  Result Value Ref Range Status   Adenovirus NOT DETECTED NOT DETECTED Final   Coronavirus 229E NOT DETECTED NOT DETECTED Final    Comment: (NOTE) The Coronavirus on the Respiratory Panel, DOES NOT test  for the novel  Coronavirus (2019 nCoV)    Coronavirus HKU1 NOT DETECTED NOT DETECTED Final   Coronavirus NL63 NOT DETECTED NOT DETECTED Final   Coronavirus OC43 NOT DETECTED NOT DETECTED Final   Metapneumovirus NOT DETECTED NOT DETECTED Final   Rhinovirus / Enterovirus NOT DETECTED NOT DETECTED Final   Influenza A NOT DETECTED NOT DETECTED Final   Influenza B NOT DETECTED NOT DETECTED Final   Parainfluenza Virus 1 NOT DETECTED NOT DETECTED Final   Parainfluenza Virus 2 NOT DETECTED NOT DETECTED Final   Parainfluenza Virus 3 NOT DETECTED NOT DETECTED Final   Parainfluenza Virus 4 NOT DETECTED NOT DETECTED Final   Respiratory Syncytial Virus NOT DETECTED NOT DETECTED Final   Bordetella pertussis NOT DETECTED NOT DETECTED Final   Bordetella Parapertussis NOT DETECTED NOT DETECTED Final   Chlamydophila pneumoniae NOT DETECTED NOT DETECTED Final   Mycoplasma pneumoniae NOT DETECTED NOT DETECTED Final    Comment: Performed at [facility], [address], [place], [place] [address]  Body fluid culture w Gram Stain     Status: None (Preliminary result)   Collection Time: 08/11/2023  7:25 AM   Specimen: Pleura; Body Fluid  Result Value Ref Range Status   Specimen Description PLEURAL RIGHT  Final   Special Requests NONE  Final   Gram Stain   Final    NO WBC SEEN NO ORGANISMS SEEN Performed at [facility], [address], [place], [place] [address]    Culture PENDING  Incomplete   Report Status PENDING  Incomplete     Radiology Studies: EEG adult  Result Date: August 11, 2023 Charlsie Quest, MD     2023/08/11  8:47 AM Patient Name: Kristen Hansen MRN: [id number] Epilepsy Attending: Charlsie Quest Referring Physician/Provider: Meryl Dare, MD Date: 06/30/2023 Duration: 22.43 mins Patient history: 77yo f getting eeg to evaluate for seizure Level of alertness: Awake AEDs during EEG study: LTG, OXC Technical aspects: This EEG study was done with scalp electrodes positioned according to  the 10-20 International system of electrode placement. Electrical activity was reviewed with band pass filter of 1-70Hz , sensitivity of 7 uV/mm, display speed of 28mm/sec with a 60Hz  notched filter applied as appropriate. EEG data were recorded continuously and digitally stored.  Video monitoring was available and reviewed as appropriate. Description: EEG showed continuous generalized 3 to 6 Hz theta-delta slowing. Hyperventilation and photic stimulation were not performed.   ABNORMALITY - Continuous slow, generalized IMPRESSION: This study is suggestive of moderate diffuse encephalopathy, nonspecific etiology. No seizures or epileptiform discharges were seen throughout the recording. Charlsie Quest   CT CHEST WO CONTRAST  Result Date: 07/31/2023 CLINICAL DATA:  Pneumonia, pleural effusion suspected (Ped 0-17y) evaluate for pleural pathology in right lower hemithorax EXAM: CT CHEST WITHOUT CONTRAST TECHNIQUE: Multidetector CT imaging of the chest was performed following the standard protocol without IV contrast. RADIATION DOSE REDUCTION: This exam was performed according to the departmental dose-optimization program which includes automated exposure control, adjustment of the mA and/or kV according to patient size and/or use of iterative reconstruction technique. COMPARISON:  07/25/2023.  Chest x-ray today. FINDINGS: Cardiovascular: Heart is normal size. Coronary artery and aortic calcifications. Aorta normal caliber. Mediastinum/Nodes: No mediastinal, hilar, or  axillary adenopathy. Trachea and esophagus are unremarkable. Thyroid unremarkable. Lungs/Pleura: Right pigtail pleural catheter in place with decreasing right pleural effusion. Small right pleural effusion remains with locules of gas throughout the pleural space. Bilateral lower lobe nodular ground-glass airspace opacities, right greater than left. Upper Abdomen: Left hydronephrosis noted with contrast in the collecting system from prior CTA head/neck.  There is a large left UPJ stone measuring 1.8 x 1.0 cm. Multiple mid and lower pole left renal stones. Musculoskeletal: Bilateral breast implants, grossly unremarkable. No acute bony abnormality. IMPRESSION: Decreasing right pleural effusion following placement of posterior pigtail pleural catheter. Small right pleural effusion and locules of pleural gas noted currently. Patchy bilateral ground-glass airspace disease, right greater than left. Cannot exclude pneumonia. 1.8 x 1.0 cm left UPJ stone with left hydronephrosis. Coronary artery disease. Aortic Atherosclerosis (ICD10-I70.0). Electronically Signed   By: Charlett Nose M.D.   On: 07/31/2023 17:03   MR BRAIN WO CONTRAST  Result Date: 07/31/2023 CLINICAL DATA:  Stroke, follow up rule out CVA, L side weakness previously reported. EXAM: MRI HEAD WITHOUT CONTRAST TECHNIQUE: Multiplanar, multiecho pulse sequences of the brain and surrounding structures were obtained without intravenous contrast. COMPARISON:  Head CT and CTA head/neck 07/31/2023. MRI brain 01/24/2021. FINDINGS: Brain: No acute infarct or hemorrhage. Mild chronic small-vessel disease with old lacunar infarct in the right cerebellar hemisphere. No hydrocephalus or extra-axial collection. No mass or midline shift. Vascular: Normal flow voids. Skull and upper cervical spine: Normal marrow signal. Sinuses/Orbits: Unremarkable. Other: None. IMPRESSION: 1. No acute intracranial process. 2. Mild chronic small-vessel disease with old lacunar infarct in the right cerebellar hemisphere. Electronically Signed   By: Orvan Falconer M.D.   On: 07/31/2023 17:01   CT ANGIO HEAD NECK W WO CM  Result Date: 07/31/2023 CLINICAL DATA:  Stroke, follow-up.  Rule out stroke. EXAM: CT ANGIOGRAPHY HEAD AND NECK WITH AND WITHOUT CONTRAST TECHNIQUE: Multidetector CT imaging of the head and neck was performed using the standard protocol during bolus administration of intravenous contrast. Multiplanar CT image reconstructions  and MIPs were obtained to evaluate the vascular anatomy. Carotid stenosis measurements (when applicable) are obtained utilizing NASCET criteria, using the distal internal carotid diameter as the denominator. RADIATION DOSE REDUCTION: This exam was performed according to the departmental dose-optimization program which includes automated exposure control, adjustment of the mA and/or kV according to patient size and/or use of iterative reconstruction technique. CONTRAST:  60mL OMNIPAQUE IOHEXOL 350 MG/ML SOLN COMPARISON:  Head CT 07/31/2023. FINDINGS: CTA NECK FINDINGS Aortic arch: Three-vessel arch configuration. Arch vessel origins are patent. Right carotid system: No evidence of dissection, stenosis (50% or greater), or occlusion. Left carotid system: No evidence of dissection, stenosis (50% or greater), or occlusion. Vertebral arteries: Codominant. No evidence of dissection, stenosis (50% or greater), or occlusion. Skeleton: Negative. Other neck: Negative. Upper chest: Partially imaged right pleural effusion with gas from chest tube placement, better evaluated on same day chest CT. Review of the MIP images confirms the above findings CTA HEAD FINDINGS Anterior circulation: Calcified plaque along the carotid siphons without hemodynamically significant stenosis. The proximal ACAs and MCAs are patent without stenosis or aneurysm. Distal branches are symmetric. Posterior circulation: Normal basilar artery. The SCAs, AICAs and PICAs are patent proximally. The PCAs are patent proximally without stenosis or aneurysm. Distal branches are symmetric. Venous sinuses: Early phase of contrast. Anatomic variants: Persistent fetal origin of the right PCA with hypoplastic right P1 segment. Review of the MIP images confirms the above findings IMPRESSION: 1. No  large vessel occlusion, hemodynamically significant stenosis, or aneurysm in the head or neck. 2. Partially imaged right pleural effusion with gas from chest tube placement,  better evaluated on same day chest CT. Electronically Signed   By: Orvan Falconer M.D.   On: 07/31/2023 16:42   US Abdomen Limited RUQ (LIVER/GB)  Result Date: 07/31/2023 CLINICAL DATA:  Right upper quadrant pain.  Ascites EXAM: ULTRASOUND ABDOMEN LIMITED RIGHT UPPER QUADRANT COMPARISON:  None Available. FINDINGS: Gallbladder: Distended gallbladder. Gallbladder is folded. No shadowing stones or adjacent fluid. Common bile duct: Diameter: 3 mm Liver: No focal lesion identified. Within normal limits in parenchymal echogenicity. Portal vein is patent on color Doppler imaging with normal direction of blood flow towards the liver. Other: Mild ascites identified along the abdomen. Right-sided pleural effusion IMPRESSION: No gallstones or ductal dilatation. Mild ascites. Right-sided small pleural effusion Electronically Signed   By: Karen Kays M.D.   On: 07/31/2023 15:17    Scheduled Meds:  amiodarone  200 mg Oral BID   aspirin  81 mg Oral Daily   colchicine  0.6 mg Oral Daily   furosemide  20 mg Oral Daily   irbesartan  150 mg Oral Daily   lamoTRIgine  150 mg Oral Daily   methIMAzole  10 mg Oral Daily   metoprolol tartrate  25 mg Oral BID   montelukast  10 mg Oral QHS   Oxcarbazepine  300 mg Oral BID   pantoprazole  40 mg Oral Daily   potassium chloride  40 mEq Oral Once   pravastatin  40 mg Oral QAC breakfast   pyridostigmine  60 mg Oral Q8H   sertraline  50 mg Oral Daily   sodium chloride flush  3 mL Intravenous Q12H   sodium chloride flush  3 mL Intravenous Q12H   spironolactone  12.5 mg Oral Daily   Continuous Infusions:  sodium chloride     sodium chloride 100 mL/hr at 08/01/23 2011   heparin 1,500 Units/hr (08/02/23 0118)   piperacillin-tazobactam (ZOSYN)  IV 3.375 g (08/02/23 0117)     LOS: 8 days   Hughie Closs, MD [facility]  08/02/2023, 7:57 AM   *Please note that this is a verbal dictation therefore any spelling or grammatical errors are due to the "[employer]agon [employer]  One" system interpretation.  Please Getter via Amion and do not message via secure chat for urgent patient care matters. Secure chat can be used for non urgent patient care matters.  How to contact the [facility] Attending or Consulting provider 7A - 7P or covering provider during after hours 7P -7A, for this patient?  Check the care team in [facility] and look for a) attending/consulting [facility] provider listed and b) the [facility] team listed. [place] or secure chat 7A-7P. Log into [email or web address] and use [place]'s universal password to access. If you do not have the password, please contact the hospital operator. Locate the [facility] provider you are looking for under [facility] and Plog to a number that you can be directly reached. If you still have difficulty reaching the provider, please [facility] the [facility] (Director on Call) for the Hospitalists listed on amion for assistance.

## 2023-08-02 NOTE — Progress Notes (Signed)
   NAME:  Kristen Hansen, MRN:  [id number], DOB:  1946-02-16, LOS: 8 ADMISSION DATE:  07/25/2023, CONSULTATION DATE: 07/27/2023 REFERRING MD: Dr. Thedore Mins, CHIEF COMPLAINT: Pleural effusion  History of Present Illness:  Asked to see for recurrent pleural effusion Recently had a thoracenteses for 1.1 L of exudative fluid  Denies any significant complaints at present  Presented to the hospital with chest pain, shortness of breath with activity  Pertinent  Medical History   Past Medical History:  Diagnosis Date   Back pain    Cancer (HCC)    Skin, basal cell   Dyspnea    Fatigue    GERD (gastroesophageal reflux disease)    Headache(784.0)    history of migraines   History of kidney stones    Hypercholesteremia    Hypertension    Leg swelling    Bilateral   Occasional tremors    mild hand tremors   Osteoarthritis    PONV (postoperative nausea and vomiting)    RBD (REM behavioral disorder)    Rheumatoid arthritis(714.0)    Scoliosis of lumbar spine    Spinal stenosis    Multiple levels   Tic [place]    Trigeminal neuralgia 03/30/2015   Vision abnormalities    Significant Hospital Events: Including procedures, antibiotic start and stop dates in addition to other pertinent events   8/1 thoracentesis 1.3 L 8/4 chest tube placement> 1.35L drained that shift DC chest tube 08/02/2023  Interim History / Subjective:  Continues to improve we will discontinue chest tube  Objective   Blood pressure (!) 147/65, pulse (!) 59, temperature 97.6 F (36.4 C), temperature source Oral, resp. rate 12, height 5\' 10"  (1.778 m), weight 52.9 kg, SpO2 99%.        Intake/Output Summary (Last 24 hours) at 08/02/2023 7829 Last data filed at 08/02/2023 0600 Gross per 24 hour  Intake 2673.84 ml  Output 1020 ml  Net 1653.84 ml   Filed Weights   07/31/23 0403 08/01/23 0422 08/02/23 0445  Weight: 53.6 kg 52.5 kg 52.9 kg    Examination: Frail elderly female more awake and responsive No JVD or  lymphadenopathy is appreciated Heart sounds are irregular Decreased breath sounds right base right chest tube without drainage flushes easily Abdomen soft nontender Extremities warm and dry     Resolved Hospital Problem list     Assessment & Plan:  Right sided pleural effusion, weakly exudative - differential diagnosis serositis due to pleuropericarditis, aspiration related reactive effusion. Heart failure with diuresis is most likely with such low cellularity. Normal glucose suggests not RA-related.   Repeat chemistries and cultures on chest tube drainage unremarkable No drainage from chest tube over 24 hours Continue empirical antimicrobial therapy Will DC chest tube and have follow-up chest x-ray in a.m.    Acute on chronic HFpEF, new RWMA on echo Cardiology is following  Atrial fibrillation, new onset Cardiology is following On heparin drip  Pericarditis- autoimmune? Continuing colchicine  History of Sjogren's syndrome & rheumatoid arthritis Continue current medications monitor  Status post code stroke 07/30/2023 CT was negative MRI is negative Suspect is related to sedation from cardiac catheterization she is more awake 08/02/2023 Neurology is following  Brett Canales  ACNP Acute Care Nurse Practitioner [facility] Please consult Amion 08/02/2023, 9:28 AM

## 2023-08-02 NOTE — Progress Notes (Signed)
ANTICOAGULATION CONSULT NOTE  Pharmacy Consult for Heparin Indication: atrial fibrillation  Allergies  Allergen Reactions   Morphine And Codeine Hives   Prevacid [Lansoprazole] Other (See Comments)    unknown   Provigil [Modafinil] Other (See Comments)    dizziness   Adhesive [Tape] Rash    bandaids     Patient Measurements: Height: 5\' 10"  (177.8 cm) Weight: 52.5 kg (115 lb 11.9 oz) IBW/kg (Calculated) : 68.5 Heparin Dosing Weight: 56 kg  Vital Signs: Temp: 98.5 F (36.9 C) (08/07 2343) Temp Source: Oral (08/07 2343) BP: 134/60 (08/07 2343) Pulse Rate: 62 (08/07 2343)  Labs: Recent Labs    07/31/23 0714 07/31/23 2141 08/01/23 0638 08/01/23 1739 08/02/23 0149  HGB 12.2  --  9.8*  --  9.3*  HCT 37.7  --  30.9*  --  28.2*  PLT 307  --  266  --  279  HEPARINUNFRC  --    < > <0.10* 0.48 0.47  CREATININE 1.15*  --  0.88  --  1.23*   < > = values in this interval not displayed.    Estimated Creatinine Clearance: 32.2 mL/min (A) (by C-G formula based on SCr of 1.23 mg/dL (H)).  Assessment: Patient is a 53 yof admitted for chest pain and new onset afib. Patient found to have pericardial effusion/pericarditis. She had thoracentesis 8/1 with 1.3L removed and now s/p cath 8/5. Code stroke called 8/5 and CT and MRI negative for acute stroke. Plans for oral anticoagulation after the chest tube is removed  Heparin level is 0.48 (therapeutic) on 1500 units/hr.  No bleeding or problems with infusion, per discussion with RN.   8/8 AM update:  Heparin level therapeutic x 2  Goal of Therapy:  Heparin level 0.3-0.7 units/ml Monitor platelets by anticoagulation protocol: Yes   Plan:   Cont heparin 1500 units/hr Daily CBC and heparin level Monitor for bleeding  Abran Duke, PharmD, BCPS Clinical Pharmacist Phone: [phone]

## 2023-08-03 ENCOUNTER — Inpatient Hospital Stay (HOSPITAL_COMMUNITY): Payer: Medicare PPO

## 2023-08-03 DIAGNOSIS — I480 Paroxysmal atrial fibrillation: Secondary | ICD-10-CM | POA: Diagnosis not present

## 2023-08-03 DIAGNOSIS — I5032 Chronic diastolic (congestive) heart failure: Secondary | ICD-10-CM | POA: Diagnosis not present

## 2023-08-03 DIAGNOSIS — J90 Pleural effusion, not elsewhere classified: Secondary | ICD-10-CM | POA: Diagnosis not present

## 2023-08-03 DIAGNOSIS — I300 Acute nonspecific idiopathic pericarditis: Secondary | ICD-10-CM | POA: Diagnosis not present

## 2023-08-03 LAB — MAGNESIUM: Magnesium: 1.7 mg/dL (ref 1.7–2.4)

## 2023-08-03 MED ORDER — ORAL CARE MOUTH RINSE: 15.0000 mL | OROMUCOSAL | Status: DC | PRN

## 2023-08-03 MED ORDER — ORAL CARE MOUTH RINSE
15.0000 mL | OROMUCOSAL | Status: DC
  Administered 2023-08-03 – 2023-08-08 (×22): 15 mL via OROMUCOSAL

## 2023-08-03 MED ORDER — AMIODARONE HCL 200 MG PO TABS
200.0000 mg | ORAL_TABLET | Freq: Every day | ORAL | Status: DC
  Administered 2023-08-04 – 2023-08-08 (×5): 200 mg via ORAL
  Filled 2023-08-03 (×5): qty 1

## 2023-08-03 MED ORDER — APIXABAN 5 MG PO TABS
5.0000 mg | ORAL_TABLET | Freq: Two times a day (BID) | ORAL | Status: DC
  Administered 2023-08-03 – 2023-08-08 (×11): 5 mg via ORAL
  Filled 2023-08-03 (×11): qty 1

## 2023-08-03 NOTE — Progress Notes (Signed)
ANTICOAGULATION CONSULT NOTE  Pharmacy Consult for Heparin Indication: atrial fibrillation  Allergies  Allergen Reactions   Morphine And Codeine Hives   Prevacid [Lansoprazole] Other (See Comments)    unknown   Provigil [Modafinil] Other (See Comments)    dizziness   Adhesive [Tape] Rash    bandaids     Patient Measurements: Height: 5\' 10"  (177.8 cm) Weight: 50.2 kg (110 lb 10.7 oz) IBW/kg (Calculated) : 68.5 Heparin Dosing Weight: 56 kg  Vital Signs: Temp: 98.3 F (36.8 C) (08/09 0535) Temp Source: Axillary (08/09 0535) BP: 157/67 (08/09 0535) Pulse Rate: 60 (08/09 0535)  Labs: Recent Labs    08/01/23 0638 08/01/23 1739 08/02/23 0149 08/03/23 0628  HGB 9.8*  --  9.3* 9.7*  HCT 30.9*  --  28.2* 30.8*  PLT 266  --  279 287  HEPARINUNFRC <0.10* 0.48 0.47 0.91*  CREATININE 0.88  --  1.23* 0.90    Estimated Creatinine Clearance: 42.1 mL/min (by C-G formula based on SCr of 0.9 mg/dL).  Assessment: Patient is a 85 yof admitted for chest pain and new onset afib. Patient found to have pericardial effusion/pericarditis. She had thoracentesis 8/1 with 1.3L removed and now s/p cath 8/5. Code stroke called 8/5 and CT and MRI negative for acute stroke.   Plans for oral anticoagulation after the chest tube is removed which was done on 8/8. Plans noted for apixaban  Heparin level is 0.91 (supratherapeutic) on 1500 units/hr. CBC stable    Goal of Therapy:  Heparin level 0.3-0.7 units/ml Monitor platelets by anticoagulation protocol: Yes   Plan:   -Decrease heparin to 1400 units/hr -Will follow plans for apixaban (cost= $40)  Harland German, PharmD Clinical Pharmacist **Pharmacist phone directory can now be found on [email or web address] ([email or web address]).  Listed under [facility].

## 2023-08-03 NOTE — Plan of Care (Signed)
  Problem: Education: Goal: Knowledge of General Education information will improve Description: Including pain rating scale, medication(s)/side effects and non-pharmacologic comfort measures Outcome: Progressing   Problem: Health Behavior/Discharge Planning: Goal: Ability to manage health-related needs will improve Outcome: Progressing   Problem: Clinical Measurements: Goal: Ability to maintain clinical measurements within normal limits will improve Outcome: Progressing Goal: Will remain free from infection Outcome: Progressing Goal: Diagnostic test results will improve Outcome: Progressing Goal: Respiratory complications will improve Outcome: Progressing Goal: Cardiovascular complication will be avoided Outcome: Progressing   Problem: Activity: Goal: Risk for activity intolerance will decrease Outcome: Progressing   Problem: Nutrition: Goal: Adequate nutrition will be maintained Outcome: Progressing   Problem: Coping: Goal: Level of anxiety will decrease Outcome: Progressing   Problem: Elimination: Goal: Will not experience complications related to bowel motility Outcome: Progressing Goal: Will not experience complications related to urinary retention Outcome: Progressing   Problem: Pain Managment: Goal: General experience of comfort will improve Outcome: Progressing   Problem: Safety: Goal: Ability to remain free from injury will improve Outcome: Progressing   Problem: Skin Integrity: Goal: Risk for impaired skin integrity will decrease Outcome: Progressing   Problem: Activity: Goal: Ability to tolerate increased activity will improve Outcome: Progressing   Problem: Clinical Measurements: Goal: Ability to maintain a body temperature in the normal range will improve Outcome: Progressing   Problem: Respiratory: Goal: Ability to maintain adequate ventilation will improve Outcome: Progressing Goal: Ability to maintain a clear airway will improve Outcome:  Progressing   Problem: Education: Goal: Understanding of CV disease, CV risk reduction, and recovery process will improve Outcome: Progressing Goal: Individualized Educational Video(s) Outcome: Progressing   Problem: Activity: Goal: Ability to return to baseline activity level will improve Outcome: Progressing   Problem: Cardiovascular: Goal: Ability to achieve and maintain adequate cardiovascular perfusion will improve Outcome: Progressing Goal: Vascular access site(s) Level 0-1 will be maintained Outcome: Progressing   Problem: Health Behavior/Discharge Planning: Goal: Ability to safely manage health-related needs after discharge will improve Outcome: Progressing

## 2023-08-03 NOTE — Progress Notes (Addendum)
Rounding Note    Patient Name: Kristen Hansen Date of Encounter: 08/03/2023  [facility] Cardiologist: [facility] - Family express wish to switch cardiologist to Dr. Bjorn Pippin  Subjective   Denies any CP or SOB. Husband at bedside.   Inpatient Medications    Scheduled Meds:  amiodarone  200 mg Oral BID   aspirin  81 mg Oral Daily   colchicine  0.6 mg Oral Daily   irbesartan  150 mg Oral Daily   lamoTRIgine  150 mg Oral Daily   methIMAzole  10 mg Oral Daily   metoprolol tartrate  25 mg Oral BID   montelukast  10 mg Oral QHS   mouth rinse  15 mL Mouth Rinse 4 times per day   Oxcarbazepine  300 mg Oral BID   pantoprazole  40 mg Oral Daily   pravastatin  40 mg Oral QAC breakfast   pyridostigmine  60 mg Oral Q8H   sertraline  50 mg Oral Daily   sodium chloride flush  3 mL Intravenous Q12H   sodium chloride flush  3 mL Intravenous Q12H   spironolactone  12.5 mg Oral Daily   Continuous Infusions:  sodium chloride     heparin 1,500 Units/hr (08/02/23 1915)   PRN Meds: sodium chloride, acetaminophen **OR** acetaminophen, diltiazem, loperamide, nitroGLYCERIN, mouth rinse, polyethylene glycol, sodium chloride flush, valACYclovir   Vital Signs    Vitals:   08/02/23 1700 08/02/23 1942 08/03/23 0100 08/03/23 0535  BP:  (!) 153/64 (!) 144/72 (!) 157/67  Pulse: (!) 59 62  60  Resp: 14 17  14   Temp:  97.8 F (36.6 C) 97.9 F (36.6 C) 98.3 F (36.8 C)  TempSrc:  Axillary Axillary Axillary  SpO2: 100% 100%  97%  Weight:    50.2 kg  Height:        Intake/Output Summary (Last 24 hours) at 08/03/2023 0759 Last data filed at 08/03/2023 0100 Gross per 24 hour  Intake 858.75 ml  Output 231 ml  Net 627.75 ml      08/03/2023    5:35 AM 08/02/2023    4:45 AM 08/01/2023    4:22 AM  Last 3 Weights  Weight (lbs) 110 lb 10.7 oz 116 lb 10 oz 115 lb 11.9 oz  Weight (kg) 50.2 kg 52.9 kg 52.5 kg      Telemetry    NSR with HR around 60 bpm - Personally Reviewed  ECG    NSR  without significant ST-T wave changes - Personally Reviewed  Physical Exam   GEN: No acute distress.   Neck: No JVD Cardiac: RRR, no murmurs, rubs, or gallops.  Respiratory: Clear to auscultation bilaterally. GI: Soft, nontender, non-distended  MS: No edema; No deformity. Neuro:  Nonfocal  Psych: Normal affect   Labs    High Sensitivity Troponin:   Recent Labs  Lab 07/25/23 0443 07/25/23 0958 07/25/23 1533 07/25/23 1745 07/27/23 0852  TROPONINIHS 34* 38* 42* 41* 26*     Chemistry Recent Labs  Lab 08/01/23 1610 08/02/23 0149 08/03/23 0628  NA 132* 132* 133*  K 3.4* 3.3* 3.2*  CL 100 102 102  CO2 19* 21* 21*  GLUCOSE 71 89 105*  BUN 13 11 9   CREATININE 0.88 1.23* 0.90  CALCIUM 8.1* 7.7* 8.2*  MG 1.6* 1.9 1.7  PROT 4.9* 4.7* 5.1*  ALBUMIN 1.9* 1.9* 2.1*  AST 26 31 21   ALT 22 23 21   ALKPHOS 141* 131* 133*  BILITOT 0.5 0.3 0.4  GFRNONAA >60  46* >60  ANIONGAP 13 9 10     Lipids No results for input(s): "CHOL", "TRIG", "HDL", "LABVLDL", "LDLCALC", "CHOLHDL" in the last 168 hours.  Hematology Recent Labs  Lab 08/01/23 0258 08/02/23 0149 08/03/23 0628  WBC 6.7 6.9 7.1  RBC 3.74* 3.40* 3.71*  HGB 9.8* 9.3* 9.7*  HCT 30.9* 28.2* 30.8*  MCV 82.6 82.9 83.0  MCH 26.2 27.4 26.1  MCHC 31.7 33.0 31.5  RDW 14.0 14.0 14.1  PLT 266 279 287   Thyroid No results for input(s): "TSH", "FREET4" in the last 168 hours.  BNP Recent Labs  Lab 07/31/23 0714 08/01/23 0638 08/02/23 0149  BNP 356.1* 664.7* 744.2*    DDimer No results for input(s): "DDIMER" in the last 168 hours.   Radiology    EEG adult  Result Date: 08/01/2023 Charlsie Quest, MD     08/01/2023  8:47 AM Patient Name: DOMINI DHILLON MRN: [id number] Epilepsy Attending: Charlsie Quest Referring Physician/Provider: Meryl Dare, MD Date: 06/30/2023 Duration: 22.43 mins Patient history: 77yo f getting eeg to evaluate for seizure Level of alertness: Awake AEDs during EEG study: LTG, OXC Technical aspects:  This EEG study was done with scalp electrodes positioned according to the 10-20 International system of electrode placement. Electrical activity was reviewed with band pass filter of 1-70Hz , sensitivity of 7 uV/mm, display speed of 38mm/sec with a 60Hz  notched filter applied as appropriate. EEG data were recorded continuously and digitally stored.  Video monitoring was available and reviewed as appropriate. Description: EEG showed continuous generalized 3 to 6 Hz theta-delta slowing. Hyperventilation and photic stimulation were not performed.   ABNORMALITY - Continuous slow, generalized IMPRESSION: This study is suggestive of moderate diffuse encephalopathy, nonspecific etiology. No seizures or epileptiform discharges were seen throughout the recording. Charlsie Quest    Cardiac Studies    Cath 07/30/2023 No angiographic evidence of CAD Normal right and left heart pressures (RA 7, RV 40/4/8, PA 44/12 mean 22, PCWP 11)    Recommendations: No further ischemic workup.   Patient Profile     77 y.o. female with PMH of RA, lewy-body dementia, prior DVT, Sjogren's syndrome, chronic diastolic CHF, mitral stenosis and hypertension who presented with chest pain. Chest pain felt to be pericarditis. Underwent cath 8/5 which showed normal cors. Code stroke on 8/6 due to R arms weakness and somnolence.   Assessment & Plan    Chest pain             - abnormal myoview in Jan 2024, cardiac cath discussed at the time, but patient preferred medical management.              - presented on 7/31 with intermittent chest pain. BNP elevated at 1109. Serial borderline borderline elevated but flat. CTA negative for PE, but shows a large R pleural effusion             - initial clinical picture concerning for pericarditis. CRP elevated. Given colchicine.              - TTE showed small pericardial effusion with RWMA in septum and apex             - Cath 07/30/2023 showed normal coronary arteries, normal left and right heart  pressure.  - continued on colchicine   Pericarditis:  on colchicine, will plan for 3 month course   New onset atrial fibrillation: mainitaining NSR on amiodarone. After this morning's dose, she would have been on 200mg  BID dose for a total  7 days, will downtitrate amiodarone to 200mg  daily.  On IV heparin, chest tube D/c'ed on 8/8 yesterday, pending morning CXR, if no issues, will start Eliquis 5 mg BID. No further cardiology recommendation, will arrange 2 week follow up (patient wished to follow up with our cardiology service in [place])   R pleural effusion: s/p thoracentesis with removal of 1.3 L of fluid, s/p chest tube, had 1.4 L additional output overnight. Chest tube out 8/8   Acute metabolic encephalopathy: increased lethargy and R sided weakness on 8/6, code stroke activated. Initial CT showed no acute process. Neuro consulted, suspect metabolic encephalopathy, less likely TIA or seizure. 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 EEG 8/7 showed moderate diffuse encephalopathy, nonspecific etiology. No seizures.   Chronic diastolic CHF: euvolemic on exam.  Would recommend as needed Lasix 20 mg daily.  Would monitor daily weights and take Lasix if gains more than 3 pounds 1 day or 5 pounds in 1 week   Moderate MS: previous echo obtained at [facility] showed moderate MS and mild MR   Hypertension    [facility] will sign off.   Medication Recommendations: Colchicine 0.6 mg daily, metoprolol 25 mg twice daily, amiodarone 200 mg daily, Eliquis 5 mg twice daily, Lasix 20 mg daily as needed (would monitor daily weights and take Lasix if gains more than 3 pounds in 1 day or 5 pounds in 1 week). Other recommendations (labs, testing, etc): None Follow up as an outpatient: Will schedule  For questions or updates, please contact [facility] Please consult [email or web address] for contact info under        Signed, Azalee Course, PA   08/03/2023, 7:59 AM     Patient seen and examined.  Agree with above documentation.  On exam, patient is alert and oriented, regular rate and rhythm, no murmurs, lungs CTAB, no LE edema.  Maintaining sinus rhythm, will reduce amiodarone dose to 200 mg daily.  Chest tube has been removed, okay to transition to Eliquis pending AM CXR.  We will sign off at this time, please call with any further questions.  Little Ishikawa, MD

## 2023-08-03 NOTE — Plan of Care (Signed)
  Problem: Education: Goal: Knowledge of General Education information will improve Description: Including pain rating scale, medication(s)/side effects and non-pharmacologic comfort measures Outcome: Progressing   Problem: Health Behavior/Discharge Planning: Goal: Ability to manage health-related needs will improve Outcome: Progressing   Problem: Clinical Measurements: Goal: Cardiovascular complication will be avoided Outcome: Progressing   Problem: Nutrition: Goal: Adequate nutrition will be maintained Outcome: Progressing   Problem: Activity: Goal: Risk for activity intolerance will decrease Outcome: Progressing   Problem: Coping: Goal: Level of anxiety will decrease Outcome: Progressing   Problem: Skin Integrity: Goal: Risk for impaired skin integrity will decrease Outcome: Progressing   Problem: Activity: Goal: Ability to tolerate increased activity will improve Outcome: Progressing   Problem: Pain Managment: Goal: General experience of comfort will improve Outcome: Progressing   Problem: Safety: Goal: Ability to remain free from injury will improve Outcome: Progressing

## 2023-08-03 NOTE — Progress Notes (Signed)
Physical Therapy Treatment Patient Details Name: Kristen Hansen MRN: [id number] DOB: [birth year] Today's Date: 08/03/2023   History of Present Illness Pt is 77 yo female presenting to [facility] ED with chest pain. Chest pain has been moving and is increased with palpation.  Pt underwent on 8/1 thoracentesis 1.3 L   and on 8/4 chest tube placement.   PMH: HTN, hyperlipidemia, Lewy body dementia, Sjogren's syndrome, rheumatoid arthritis, chronic pain, DVT, Spine disease, spinal stenosis, diastolic CHF, depression, REM behavior disorder, orthostatic Hypotension, CKD 3, GERD, hypothrydoidism.    PT Comments  Pt admitted with above diagnosis. Pt was able to ambulate with RW today needing mod assist with chair follow for safety. Pt with poor balance and needs therapy post acute < 3 hours day.  Husband and pt agree that pt needs to be more independent prior to d/c home. Will continue to follow acutely.  Pt currently with functional limitations due to the deficits listed below (see PT Problem List). Pt will benefit from acute skilled PT to increase their independence and safety with mobility to allow discharge.       If plan is discharge home, recommend the following: A little help with walking and/or transfers;Assistance with cooking/housework;Assist for transportation;Help with stairs or ramp for entrance   Can travel by private vehicle        Equipment Recommendations  None recommended by PT    Recommendations for Other Services       Precautions / Restrictions Precautions Precautions: Fall Precaution Comments: watch BP Restrictions Weight Bearing Restrictions: No     Mobility  Bed Mobility Overal bed mobility: Needs Assistance Bed Mobility: Rolling Rolling: Min assist   Supine to sit: Min assist, Used rails     General bed mobility comments: use of rail and needed some assist for elevation of trunk    Transfers Overall transfer level: Needs assistance Equipment used: Rolling walker (2  wheels) Transfers: Sit to/from Stand, Bed to chair/wheelchair/BSC Sit to Stand: Mod assist, +2 physical assistance, From elevated surface           General transfer comment: Mod assist needed to power up to  RW with cues for hand placement.  Pt leaning posteriorly on heels needing  verbala nd tactile cues to lean forward. Needed physical  assist and verbal cues to lean forward and stand tall with cues for sequencing.    Ambulation/Gait Ambulation/Gait assistance: Mod assist, +2 safety/equipment Gait Distance (Feet): 90 Feet Assistive device: Rolling walker (2 wheels) Gait Pattern/deviations: Trunk flexed, Shuffle, Step-through pattern, Decreased stride length, Decreased step length - right, Decreased step length - left, Drifts right/left, Narrow base of support, Leaning posteriorly, Antalgic   Gait velocity interpretation: <1.31 ft/sec, indicative of household ambulator   General Gait Details: Pt was able to progress ambulation today with chair follow. Pt needing assist for stability as well as assist to sequence steps and RW. Pt unsteady at times needing steadying assist.  Did need to sit after 90 feet of ambulation.   Stairs             Wheelchair Mobility     Tilt Bed    Modified Rankin (Stroke Patients Only)       Balance Overall balance assessment: Needs assistance Sitting-balance support: Single extremity supported, No upper extremity supported, Feet supported Sitting balance-Leahy Scale: Fair Sitting balance - Comments: supervision for sitting balance   Standing balance support: Bilateral upper extremity supported Standing balance-Leahy Scale: Poor Standing balance comment: relies on RW with heavy  posterior lean onto heels                            Cognition Arousal: Alert Behavior During Therapy: Flat affect Overall Cognitive Status: History of cognitive impairments - at baseline                                           Exercises General Exercises - Lower Extremity Ankle Circles/Pumps: AAROM, 5 reps Long Arc Quad: AAROM, 10 reps Hip Flexion/Marching: AAROM, 10 reps    General Comments        Pertinent Vitals/Pain Pain Assessment Pain Assessment: No/denies pain    Home Living                          Prior Function            PT Goals (current goals can now be found in the care plan section) Acute Rehab PT Goals Patient Stated Goal: none reported Progress towards PT goals: Progressing toward goals    Frequency    Min 1X/week      PT Plan Discharge plan needs to be updated    Co-evaluation              AM-PAC PT "6 Clicks" Mobility   Outcome Measure  Help needed turning from your back to your side while in a flat bed without using bedrails?: A Little Help needed moving from lying on your back to sitting on the side of a flat bed without using bedrails?: A Little Help needed moving to and from a bed to a chair (including a wheelchair)?: A Lot Help needed standing up from a chair using your arms (e.g., wheelchair or bedside chair)?: A Lot Help needed to walk in hospital room?: Total Help needed climbing 3-5 steps with a railing? : Total 6 Click Score: 12    End of Session Equipment Utilized During Treatment: Gait belt Activity Tolerance: Patient limited by fatigue Patient left: with call bell/phone within reach;with family/visitor present;in chair;with chair alarm set Nurse Communication: Mobility status;Need for lift equipment (use Stedy) PT Visit Diagnosis: Unsteadiness on feet (R26.81);Other abnormalities of gait and mobility (R26.89)     Time: 4098-1191 PT Time Calculation (min) (ACUTE ONLY): 37 min  Charges:    $Gait Training: 23-37 mins PT General Charges $$ ACUTE PT VISIT: 1 Visit                      M,PT Acute Rehab Services [phone]    Bevelyn Buckles 08/03/2023, 2:47 PM

## 2023-08-03 NOTE — Progress Notes (Signed)
PROGRESS NOTE    Kristen Hansen  ZOX:[id number] DOB: 1946/11/28 DOA: 07/25/2023 PCP: Kerin Salen, PA-C   Brief Narrative:  Kristen Hansen is a 77 y.o. female with medical history significant of hypertension, hyperlipidemia, Lewy body dementia, Sjogren's syndrome, rheumatoid arthritis, chronic pain, DVT, spinal stenosis, diastolic CHF, depression, REM behavior disorder, orthostatic hypotension, CKD 3, GERD, hyperthyroidism presenting with chest pain. Her chest pain has atypical nature and present for 2 weeks intermittently. She was found to have a moderate pleural effusion. Cardiology consulted, s/p cardiac cath with clean coronaries.    08/03/2023: Patient seen alongside patient's daughter.  Also discussed with patient's husband over the phone.  No new complaints.  Patient continues to improve.  Patient has done on colchicine.  Plan is to discharge patient to [facility] for short-term rehab.  Assessment & Plan:   Principal Problem:   Pleural effusion Active Problems:   Rheumatoid arthritis (HCC)   HTN (hypertension)   Chronic bilateral low back pain without sciatica   Depression   Lewy body dementia without behavioral disturbance, psychotic disturbance, mood disturbance, or anxiety (HCC)   Spinal stenosis of lumbar region   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)   Chronic heart failure with preserved ejection fraction (HCC)   CKD (chronic kidney disease) stage 3, GFR 30-59 ml/min (HCC)   Gastroesophageal reflux disease with esophagitis   Mixed hyperlipidemia   Chest pain   Sjogren's syndrome (HCC)   Acute idiopathic pericarditis   PAF (paroxysmal atrial fibrillation) (HCC)   Need for management of chest tube   Chest Pain, exudative and recurrent right-sided pleural Effusion, Pericardial Effusion - Chest pain improved after thoracentesis. Given re-accumulation of fluid and exudative nature, PCCM was consulted. Appreciate their assistance. They placed a chest  tube and are managing that.  Still with chest tube to suction.  At discharge, will have her follow-up with pulmonary outpatient along with rheumatology.    Acute on chronic diastolic CHF EF 50 to 55%, and some preceding history of exertional shortness of breath.   Pt had episode of worsening dyspnea on morning of 08/04 with coughing, CXR obtained showed volume overload. She was given IV lasix with improvement in her symptoms.  Cardiology on board.  Underwent cath which showed clean coronaries.  Has been started on Lasix 20 mg p.o. daily on 08/01/2023. 08/03/2023: Stable.   Pericardial Effusion She has mild pericarditis and is currently on colchicine for pericardial effusion along with aspirin per cardiology. 08/03/2023: Treat pericarditis for 3 months with colchicine.   Paroxysmal Atrial Fibrillation: Cardiology following, rates controlled, on amiodarone and metoprolol oral as well as heparin drip, will switch to DOAC once chest tube is out.  Dysphagia: OP work up ongoing. EGD without any acute findings. Getting swallowing therapy. SLP following pt. Repeat MBBS shows pt has some retention of contrast and some aspiration concern but overall unchanged from prior study and likely secondary to progressing lewy body dementia. Placed on Dys 3 Diet here. Will need follow up with palliative care OP (see below).    Lewy Body Dementia: On pyridostigmine. Last saw neurologist one month ago. Appears to be progressing and pt may benefit from palliative care consult (see below).   Acute encephalopathy/delirium: Resolved as currently she is fully alert and oriented to person place and year but not month.  Husband at the bedside verifies that this is her baseline.   GOC: Pt with lewey body dementia and multiple co-morbidities and worsening dysphagia. She may benefit  from OP palliative care consult.    Orthostatic Hypotension: Pt with orthostatic hypotension. Suspect this is secondary to her lewy body dementia.   Continue TED hose.  Would encourage her to change positions slowly. Per husband, she has some dizziness at home.    Hyponatremia: Na 132, stable.    Hypomagnesemia/Hypokalemia: Mag is normal but potassium low.  Will replace.   Hx of DVT: D dimer elevated,  CTA negative, lower extremity doppler which was negative for DVT.  Now on heparin drip for A-fib.     Hyperthyroidism: Recently started on methimazole. States she is complaint with this. Will continue here. FT4 normal and FT3 slightly decreased at 1.7. Thyroid ultrasound ordered and shows bilateral nodules that do not meet criteria for biopsy for dedicated follow up.  Will need OP endocrinology follow up.    Left Shoulder Pain: Has good PROM but pain with active ROM. Concern for RT injury. MRI was obtained that showed moderate to severe glenohumeral OA and mild RT tendinosis and mild degenerative changes of the [facility] joint and mild subacromial bursitis. Conservative measures for now.  Overall better, outpatient orthopedics follow-up postdischarge. PCP to arrange.   HTN:  Home meds: amlodipine 5 mg every day, irbesartan 300 mg every day, lopressor 25 mg BID.  Patient currently on Aldactone, irbesartan, metoprolol.  Blood pressure fairly controlled.   GERD: Continue home med protonix. Does not endorse symptoms of reflux.    Sjogren's Syndrome: Not on any pharmacotherapy.  Likely causing pericardial effusion and exudative right-sided pleural effusion, currently on colchicine for pericardial effusion may require outpatient rheumatology follow-up postdischarge.   RA: Was on enbrel. Last used 2 months ago.    Chronic back pain: Tylenol prn   MDD: Zoloft 50 mg every day. Continued here.    Trigeminal Neuralgia: On lamictal and trileptal. Both continued here.  Code stroke/ ? TIA: Patient was noted to have right-sided weakness by RN at around 6:30 AM. LKN was at about 9:00 PM last night when RN administered her meds. The patient was drowsy and poorly  cooperative at that time, which were attributed to a recent Klonopin dose .  Code stroke was called, seen by neurology.  Underwent CT head, CTA head and neck and MRI brain and was ruled out of stroke.  EEG negative as well.  Patient back at her baseline mental status.  Neurology signed off.   Obesity: Estimated body mass index is 17.65 kg/m as calculated from the following:   Height as of this encounter: 5\' 10"  (1.778 m).   Weight as of this encounter: 55.8 kg.   DVT prophylaxis: Place TED hose Start: 07/27/23 1141   Code Status: Full Code  Family Communication: Husband present at bedside.   Status is: Inpatient Remains inpatient appropriate because: Patient with chest tube   Estimated body mass index is 15.88 kg/m as calculated from the following:   Height as of this encounter: 5\' 10"  (1.778 m).   Weight as of this encounter: 50.2 kg.  Pressure Injury 08/01/23 Sacrum Medial Deep Tissue Pressure Injury - Purple or maroon localized area of discolored intact skin or blood-filled blister due to damage of underlying soft tissue from pressure and/or shear. (Active)  08/01/23 0000  Location: Sacrum  Location Orientation: Medial  Staging: Deep Tissue Pressure Injury - Purple or maroon localized area of discolored intact skin or blood-filled blister due to damage of underlying soft tissue from pressure and/or shear.  Wound Description (Comments):   Present on Admission:  Dressing Type Foam - Lift dressing to assess site every shift 08/03/23 0900   Nutritional Assessment: Body mass index is 15.88 kg/m.Marland Kitchen Seen by dietician.  I agree with the assessment and plan as outlined below: Nutrition Status:        . Skin Assessment: I have examined the patient's skin and I agree with the wound assessment as performed by the wound care RN as outlined below: Pressure Injury 08/01/23 Sacrum Medial Deep Tissue Pressure Injury - Purple or maroon localized area of discolored intact skin or  blood-filled blister due to damage of underlying soft tissue from pressure and/or shear. (Active)  08/01/23 0000  Location: Sacrum  Location Orientation: Medial  Staging: Deep Tissue Pressure Injury - Purple or maroon localized area of discolored intact skin or blood-filled blister due to damage of underlying soft tissue from pressure and/or shear.  Wound Description (Comments):   Present on Admission:   Dressing Type Foam - Lift dressing to assess site every shift 08/03/23 0900    Consultants:  Neurology, critical care and cardiology  Procedures:  As above  Antimicrobials:  Anti-infectives (From admission, onward)    Start     Dose/Rate Route Frequency Ordered Stop   08/01/23 1600  piperacillin-tazobactam (ZOSYN) IVPB 3.375 g  Status:  Discontinued        3.375 g 12.5 mL/hr over 240 Minutes Intravenous Every 8 hours 08/01/23 0843 08/02/23 1547   08/01/23 0930  piperacillin-tazobactam (ZOSYN) IVPB 3.375 g        3.375 g 100 mL/hr over 30 Minutes Intravenous  Once 08/01/23 0843 08/01/23 1042   07/26/23 0540  valACYclovir (VALTREX) tablet 1,000 mg        1,000 mg Oral Every 12 hours PRN 07/26/23 0541           Subjective: Patient seen No chest pain No shortness of breath.  Objective: Vitals:   08/02/23 1942 08/03/23 0100 08/03/23 0535 08/03/23 1245  BP: (!) 153/64 (!) 144/72 (!) 157/67 (!) 108/53  Pulse: 62  60 65  Resp: 17  14 (!) 21  Temp: 97.8 F (36.6 C) 97.9 F (36.6 C) 98.3 F (36.8 C) 97.6 F (36.4 C)  TempSrc: Axillary Axillary Axillary Oral  SpO2: 100%  97% 97%  Weight:   50.2 kg   Height:        Intake/Output Summary (Last 24 hours) at 08/03/2023 1619 Last data filed at 08/03/2023 0100 Gross per 24 hour  Intake 352.84 ml  Output 200 ml  Net 152.84 ml   Filed Weights   08/01/23 0422 08/02/23 0445 08/03/23 0535  Weight: 52.5 kg 52.9 kg 50.2 kg    Examination:  General exam: Patient is thin.  Parkinsonian features.  Not in any distress.    Respiratory system: Decreased air entry.   Cardiovascular system: S1 & S2 heard. Gastrointestinal system: Abdomen is soft and nontender.   Central nervous system: Awake and alert.  Moves all extremities.  Parkinsonian features.   Extremities: No leg edema.   Data Reviewed: I have personally reviewed following labs and imaging studies  CBC: Recent Labs  Lab 07/30/23 0418 07/30/23 1225 07/30/23 1226 07/31/23 0714 08/01/23 0638 08/02/23 0149 08/03/23 0628  WBC 9.8  --   --  14.1* 6.7 6.9 7.1  NEUTROABS 7.0  --   --  11.5* 4.8 4.7 4.8  HGB 10.9*   < > 9.9* 12.2 9.8* 9.3* 9.7*  HCT 33.5*   < > 29.0* 37.7 30.9* 28.2* 30.8*  MCV 83.3  --   --  82.0 82.6 82.9 83.0  PLT 263  --   --  307 266 279 287   < > = values in this interval not displayed.   Basic Metabolic Panel: Recent Labs  Lab 07/30/23 0418 07/30/23 1225 07/30/23 1226 07/30/23 1909 07/31/23 0714 08/01/23 1610 08/02/23 0149 08/03/23 0628  NA 131*   < > 135  --  130* 132* 132* 133*  K 3.8   < > 3.1* 3.3* 4.6 3.4* 3.3* 3.2*  CL 94*  --   --   --  94* 100 102 102  CO2 24  --   --   --  21* 19* 21* 21*  GLUCOSE 103*  --   --   --  73 71 89 105*  BUN 16  --   --   --  17 13 11 9   CREATININE 1.04*  --   --   --  1.15* 0.88 1.23* 0.90  CALCIUM 8.9  --   --   --  8.9 8.1* 7.7* 8.2*  MG 1.9  --   --   --  1.7 1.6* 1.9 1.7   < > = values in this interval not displayed.   GFR: Estimated Creatinine Clearance: 42.1 mL/min (by C-G formula based on SCr of 0.9 mg/dL). Liver Function Tests: Recent Labs  Lab 07/30/23 0418 07/31/23 0714 08/01/23 0638 08/02/23 0149 08/03/23 0628  AST 24 43* 26 31 21   ALT 17 27 22 23 21   ALKPHOS 143* 164* 141* 131* 133*  BILITOT 0.5 0.4 0.5 0.3 0.4  PROT 6.0* 6.1* 4.9* 4.7* 5.1*  ALBUMIN 2.4* 2.5* 1.9* 1.9* 2.1*   No results for input(s): "LIPASE", "AMYLASE" in the last 168 hours. Recent Labs  Lab 07/31/23 1347  AMMONIA 21   Coagulation Profile: No results for input(s): "INR",  "PROTIME" in the last 168 hours.  Cardiac Enzymes: No results for input(s): "CKTOTAL", "CKMB", "CKMBINDEX", "TROPONINI" in the last 168 hours. BNP (last 3 results) No results for input(s): "PROBNP" in the last 8760 hours. HbA1C: No results for input(s): "HGBA1C" in the last 72 hours. CBG: Recent Labs  Lab 07/30/23 2327 07/31/23 0627  GLUCAP 97 77   Lipid Profile: No results for input(s): "CHOL", "HDL", "LDLCALC", "TRIG", "CHOLHDL", "LDLDIRECT" in the last 72 hours. Thyroid Function Tests: No results for input(s): "TSH", "T4TOTAL", "FREET4", "T3FREE", "THYROIDAB" in the last 72 hours. Anemia Panel: No results for input(s): "VITAMINB12", "FOLATE", "FERRITIN", "TIBC", "IRON", "RETICCTPCT" in the last 72 hours. Sepsis Labs: Recent Labs  Lab 07/31/23 0902 08/01/23 0638 08/02/23 0149 08/03/23 0628  PROCALCITON 0.16 0.24 0.18 <0.10    Recent Results (from the past 240 hour(s))  Culture, body fluid w Gram Stain-bottle     Status: None   Collection Time: 07/26/23 12:33 PM   Specimen: Pleura  Result Value Ref Range Status   Specimen Description PLEURAL  Final   Special Requests PLEURAL  Final   Culture   Final    NO GROWTH 5 DAYS Performed at [facility], [address], [place], [place] [address]    Report Status 07/31/2023 FINAL  Final  Gram stain     Status: None   Collection Time: 07/26/23 12:33 PM   Specimen: Pleura  Result Value Ref Range Status   Specimen Description PLEURAL  Final   Special Requests NONE  Final   Gram Stain   Final    RARE WBC PRESENT,BOTH PMN AND MONONUCLEAR NO ORGANISMS SEEN Performed at [facility], [address] [place], [place] [address]    Report Status 07/26/2023 FINAL  Final  Respiratory (~20 pathogens) panel by PCR     Status: None   Collection Time: 07/27/23 12:28 PM   Specimen: Nasopharyngeal Swab; Respiratory  Result Value Ref Range Status   Adenovirus NOT DETECTED NOT DETECTED Final   Coronavirus 229E NOT DETECTED  NOT DETECTED Final    Comment: (NOTE) The Coronavirus on the Respiratory Panel, DOES NOT test for the novel  Coronavirus (2019 nCoV)    Coronavirus HKU1 NOT DETECTED NOT DETECTED Final   Coronavirus NL63 NOT DETECTED NOT DETECTED Final   Coronavirus OC43 NOT DETECTED NOT DETECTED Final   Metapneumovirus NOT DETECTED NOT DETECTED Final   Rhinovirus / Enterovirus NOT DETECTED NOT DETECTED Final   Influenza A NOT DETECTED NOT DETECTED Final   Influenza B NOT DETECTED NOT DETECTED Final   Parainfluenza Virus 1 NOT DETECTED NOT DETECTED Final   Parainfluenza Virus 2 NOT DETECTED NOT DETECTED Final   Parainfluenza Virus 3 NOT DETECTED NOT DETECTED Final   Parainfluenza Virus 4 NOT DETECTED NOT DETECTED Final   Respiratory Syncytial Virus NOT DETECTED NOT DETECTED Final   Bordetella pertussis NOT DETECTED NOT DETECTED Final   Bordetella Parapertussis NOT DETECTED NOT DETECTED Final   Chlamydophila pneumoniae NOT DETECTED NOT DETECTED Final   Mycoplasma pneumoniae NOT DETECTED NOT DETECTED Final    Comment: Performed at [facility], [address] [place], [place] [address]  Body fluid culture w Gram Stain     Status: None (Preliminary result)   Collection Time: 08/01/23  7:25 AM   Specimen: Pleura; Body Fluid  Result Value Ref Range Status   Specimen Description PLEURAL RIGHT  Final   Special Requests NONE  Final   Gram Stain NO WBC SEEN NO ORGANISMS SEEN   Final   Culture   Final    NO GROWTH 2 DAYS Performed at [facility], [address], [place], [place] [address]    Report Status PENDING  Incomplete     Radiology Studies: DG CHEST PORT 1 VIEW  Result Date: 08/03/2023 CLINICAL DATA:  77 year old female with history of chest tube. EXAM: PORTABLE CHEST 1 VIEW COMPARISON:  Chest x-ray 07/31/2023. FINDINGS: Previously noted small bore right-sided chest tube has been removed. Probable trace pneumothorax visualized in the apex. There is also a small right pleural  effusion. Poorly defined opacity in the right base likely reflects residual airspace consolidation from bronchopneumonia. Left lung is clear. No left pleural effusion. No evidence of pulmonary edema. Heart size is normal. Upper mediastinal contours are distorted by patient's rotation to the left. IMPRESSION: 1. Interval removal of right-sided chest tube. Trace right hydropneumothorax noted. 2. Probable right lower lobe bronchopneumonia. Electronically Signed   By: Trudie Reed M.D.   On: 08/03/2023 08:25    Scheduled Meds:  [START ON 08/04/2023] amiodarone  200 mg Oral Daily   apixaban  5 mg Oral BID   colchicine  0.6 mg Oral Daily   irbesartan  150 mg Oral Daily   lamoTRIgine  150 mg Oral Daily   methIMAzole  10 mg Oral Daily   metoprolol tartrate  25 mg Oral BID   montelukast  10 mg Oral QHS   mouth rinse  15 mL Mouth Rinse 4 times per day   Oxcarbazepine  300 mg Oral BID   pantoprazole  40 mg Oral Daily   pravastatin  40 mg Oral QAC breakfast   pyridostigmine  60 mg Oral Q8H   sertraline  50 mg Oral Daily   sodium chloride flush  3 mL Intravenous Q12H   sodium chloride flush  3 mL Intravenous Q12H   spironolactone  12.5 mg Oral Daily   Continuous Infusions:  sodium chloride       LOS: 9 days   Barnetta Chapel, MD [facility]  08/03/2023, 4:19 PM   *Please note that this is a verbal dictation therefore any spelling or grammatical errors are due to the "[employer]agon [employer] One" system interpretation.  Please [place] via Amion and do not message via secure chat for urgent patient care matters. Secure chat can be used for non urgent patient care matters.  How to contact the [facility] Attending or Consulting provider 7A - 7P or covering provider during after hours 7P -7A, for this patient?  Check the care team in [facility] and look for a) attending/consulting [facility] provider listed and b) the [facility] team listed. [place] or secure chat 7A-7P. Log into [email or web address] and use [place]'s universal  password to access. If you do not have the password, please contact the hospital operator. Locate the [facility] provider you are looking for under [facility] and [place] to a number that you can be directly reached. If you still have difficulty reaching the provider, please [place] the [facility] (Director on Call) for the Hospitalists listed on amion for assistance.

## 2023-08-03 NOTE — Discharge Instructions (Signed)

## 2023-08-03 NOTE — TOC Initial Note (Signed)
Transition of Care [facility]) - Initial/Assessment Note    Patient Details  Name: Kristen Hansen MRN: [id number] Date of Birth: [birth year]  [facility] of [facility]) CM/SW Contact:    Delilah Shan, LCSWA Phone Number: 08/03/2023, 4:32 PM  Clinical Narrative:                  CSW received consult for possible SNF placement at time of discharge. Due to patients current orientation CSW spoke with patients spouse regarding PT recommendation of SNF placement at time of discharge.  Patient spouse reports PTA patient comes from home with spouse. Patients spouse expressed understanding of PT recommendation and is agreeable to SNF placement at time of discharge. Patients spouse gave CSW permission to fax out initial referral near the [place] area . CSW discussed insurance authorization process.No further questions reported at this time. CSW to continue to follow and assist with discharge planning needs.   Expected Discharge Plan: Skilled Nursing Facility Barriers to Discharge: Continued Medical Work up   Patient Goals and CMS Choice   CMS Medicare.gov Compare Post Acute Care list provided to:: Patient Represenative (must comment) (patients spouse) Choice offered to / list presented to : Spouse      Expected Discharge Plan and Services In-house Referral: Clinical Social Work Discharge Planning Services: CM Consult   Living arrangements for the past 2 months: Single Family Home                   DME Agency: NA                  Prior Living Arrangements/Services Living arrangements for the past 2 months: Single Family Home Lives with:: Spouse Patient language and need for interpreter reviewed:: Yes Do you feel safe going back to the place where you live?: No   SNF  Need for Family Participation in Patient Care: Yes (Comment) Care giver support system in place?: Yes (comment)   Criminal Activity/Legal Involvement Pertinent to Current Situation/Hospitalization: No -  Comment as needed  Activities of Daily Living Home Assistive Devices/Equipment: Walker (specify type) ADL Screening (condition at time of admission) Patient's cognitive ability adequate to safely complete daily activities?: Yes Is the patient deaf or have difficulty hearing?: No Does the patient have difficulty seeing, even when wearing glasses/contacts?: No Does the patient have difficulty concentrating, remembering, or making decisions?: No Patient able to express need for assistance with ADLs?: Yes Does the patient have difficulty dressing or bathing?: No Independently performs ADLs?: Yes (appropriate for developmental age) Does the patient have difficulty walking or climbing stairs?: No Weakness of Legs: None Weakness of Arms/Hands: None  Permission Sought/Granted Permission sought to share information with : Case Manager, Magazine features editor, Family Supports Permission granted to share information with : No  Share Information with NAME: Due to patients current orientation CSW spoke with patients spouse  Permission granted to share info w AGENCY: SNF  Permission granted to share info w Relationship: spouse  Permission granted to share info w Contact Information: Due to patients current orienation CSW spoke with patients spouses [name] [phone]  Emotional Assessment       Orientation: : Oriented to Self Alcohol / Substance Use: Not Applicable Psych Involvement: No (comment)  Admission diagnosis:  Pleural effusion [J90] Abnormal EKG [R94.31] Chest pain, unspecified type [R07.9] Patient Active Problem List   Diagnosis Date Noted   PAF (paroxysmal atrial fibrillation) (HCC) 07/30/2023   Need for management of chest tube 07/30/2023  Acute idiopathic pericarditis 07/26/2023   Pleural effusion 07/25/2023   Chest pain 07/25/2023   Sjogren's syndrome (HCC) 07/25/2023   Hyperthyroidism 06/07/2023   Lewy body dementia without behavioral disturbance, psychotic  disturbance, mood disturbance, or anxiety (HCC) 06/08/2022   Spinal stenosis of lumbar region 06/08/2022   Gastroesophageal reflux disease with esophagitis 04/19/2022   Speech disturbance 12/06/2021   Depression 12/06/2021   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) 09/22/2021   Mixed hyperlipidemia 08/23/2021   Other peripheral vertigo, unspecified ear 01/20/2021   Chronic heart failure with preserved ejection fraction (HCC) 11/08/2020   History of fusion of cervical spine 09/27/2020   Transient alteration of awareness 09/27/2020   Orthostatic hypotension 09/27/2020   Cervical radiculopathy 01/06/2020   Lumbar radiculopathy 01/06/2020   CKD (chronic kidney disease) stage 3, GFR 30-59 ml/min (HCC) 07/21/2019   Numbness 05/26/2019   Chronic bilateral low back pain without sciatica 09/05/2018   Arthrofibrosis of knee joint, right 02/27/2018   Facet syndrome 04/30/2017   Excessive sleepiness 10/31/2016   Avitaminosis D 03/30/2016   Idiopathic peripheral neuropathy 02/23/2016   HTN (hypertension) 02/22/2016   Other fatigue 10/20/2015   Trigeminal neuralgia 03/30/2015   REM behavioral disorder 03/30/2015   Polypharmacy 09/11/2014   Other long term (current) drug therapy 09/11/2014   Arthritis or polyarthritis, rheumatoid (HCC) 05/14/2014   Generalized OA 05/14/2014   Rheumatoid arthritis (HCC) 05/14/2014   Postop Hyponatremia 01/23/2013   Postoperative anemia due to acute blood loss 01/23/2013   Pain due to unicompartmental arthroplasty of knee (HCC) 01/22/2013   Pain due to knee joint prosthesis (HCC) 01/22/2013   Mucositis oral 08/20/2012   PCP:  Kerin Salen, PA-C Pharmacy:   [facility] [address] [place] - [address] [place] [address] Phone: [phone] Fax: [phone]     Social Determinants of Health (SDOH) Social History: SDOH Screenings   Food Insecurity: No Food Insecurity (07/25/2023)   Housing: Low Risk  (07/25/2023)  Transportation Needs: No Transportation Needs (07/25/2023)  Utilities: Not At Risk (07/25/2023)  Financial Resource Strain: Low Risk  (08/22/2022)   [facility] prior to [facility]/24/2023[facility] visits prior to 02/24/2023.  Physical Activity: Inactive (08/22/2022)   [facility] prior to 02/24/2023., [facility] visits prior to 02/24/2023.  Social Connections: Socially Integrated (08/22/2022)   [facility] prior to 02/24/2023., [facility] visits prior to 02/24/2023.  Stress: No Stress Concern Present (08/22/2022)   [facility] prior to 02/24/2023., [facility] visits prior to 02/24/2023.  Tobacco Use: Low Risk  (07/25/2023)   SDOH Interventions:     Readmission Risk Interventions     No data to display

## 2023-08-03 NOTE — NC FL2 (Signed)
[place] MEDICAID FL2 LEVEL OF CARE FORM     IDENTIFICATION  Patient Name: Kristen Hansen Birthdate: 1946/01/23 Sex: female Admission Date (Current Location): 07/25/2023  [facility] and [place] Number:  Producer, television/film/video and Address:  [facility] [place]. [facility], [address], [place], [place] [address]      Provider Number: [id number]  Attending Physician Name and Address:  Barnetta Chapel, MD  Relative Name and Phone Number:  [name] (spouse) [phone]    Current Level of Care: Hospital Recommended Level of Care: Skilled Nursing Facility Prior Approval Number:    Date Approved/Denied:   PASRR Number:    Discharge Plan: SNF    Current Diagnoses: Patient Active Problem List   Diagnosis Date Noted   PAF (paroxysmal atrial fibrillation) (HCC) 07/30/2023   Need for management of chest tube 07/30/2023   Acute idiopathic pericarditis 07/26/2023   Pleural effusion 07/25/2023   Chest pain 07/25/2023   Sjogren's syndrome (HCC) 07/25/2023   Hyperthyroidism 06/07/2023   Lewy body dementia without behavioral disturbance, psychotic disturbance, mood disturbance, or anxiety (HCC) 06/08/2022   Spinal stenosis of lumbar region 06/08/2022   Gastroesophageal reflux disease with esophagitis 04/19/2022   Speech disturbance 12/06/2021   Depression 12/06/2021   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) 09/22/2021   Mixed hyperlipidemia 08/23/2021   Other peripheral vertigo, unspecified ear 01/20/2021   Chronic heart failure with preserved ejection fraction (HCC) 11/08/2020   History of fusion of cervical spine 09/27/2020   Transient alteration of awareness 09/27/2020   Orthostatic hypotension 09/27/2020   Cervical radiculopathy 01/06/2020   Lumbar radiculopathy 01/06/2020   CKD (chronic kidney disease) stage 3, GFR 30-59 ml/min (HCC) 07/21/2019   Numbness 05/26/2019   Chronic bilateral low back pain without sciatica 09/05/2018   Arthrofibrosis of  knee joint, right 02/27/2018   Facet syndrome 04/30/2017   Excessive sleepiness 10/31/2016   Avitaminosis D 03/30/2016   Idiopathic peripheral neuropathy 02/23/2016   HTN (hypertension) 02/22/2016   Other fatigue 10/20/2015   Trigeminal neuralgia 03/30/2015   REM behavioral disorder 03/30/2015   Polypharmacy 09/11/2014   Other long term (current) drug therapy 09/11/2014   Arthritis or polyarthritis, rheumatoid (HCC) 05/14/2014   Generalized OA 05/14/2014   Rheumatoid arthritis (HCC) 05/14/2014   Postop Hyponatremia 01/23/2013   Postoperative anemia due to acute blood loss 01/23/2013   Pain due to unicompartmental arthroplasty of knee (HCC) 01/22/2013   Pain due to knee joint prosthesis (HCC) 01/22/2013   Mucositis oral 08/20/2012    Orientation RESPIRATION BLADDER Height & Weight     Self, Place  Normal Incontinent, External catheter (Eternal Urinary catheter) Weight: 110 lb 10.7 oz (50.2 kg) Height:  5\' 10"  (177.8 cm)  BEHAVIORAL SYMPTOMS/MOOD NEUROLOGICAL BOWEL NUTRITION STATUS      Incontinent Diet (Please see discharge summary)  AMBULATORY STATUS COMMUNICATION OF NEEDS Skin   Limited Assist Verbally Other (Comment) (Appropriate for ethnicity,Erythema,perineum,sacrum,Bil.,Wound/Incision LDAs,PI sacrum medial,deep tissue,PRN)                       Personal Care Assistance Level of Assistance  Feeding, Bathing, Dressing Bathing Assistance: Maximum assistance Feeding assistance: Limited assistance Dressing Assistance: Maximum assistance     Functional Limitations Info  Sight, Hearing, Speech Sight Info:  (Reading glasses) Hearing Info: Adequate Speech Info: Adequate    SPECIAL CARE FACTORS FREQUENCY  PT (By licensed PT), OT (By licensed OT)     PT Frequency: 5x min weekly OT Frequency:  5x min weekly            Contractures Contractures Info: Not present    Additional Factors Info  Code Status, Allergies, Psychotropic Code Status Info: FULL Allergies  Info: Morphine And Codeine,Prevacid (lansoprazole),Provigil (modafinil),Adhesive (tape) Psychotropic Info: OXcarbazepine (TRILEPTAL) tablet 300 mg 2 times daily,sertraline (ZOLOFT) tablet 50 mg daily         Current Medications (08/03/2023):  This is the current hospital active medication list Current Facility-Administered Medications  Medication Dose Route Frequency Provider Last Rate Last Admin   0.9 %  sodium chloride infusion  250 mL Intravenous PRN Kathleene Hazel, MD       acetaminophen (TYLENOL) tablet 650 mg  650 mg Oral Q6H PRN Kathleene Hazel, MD   650 mg at 07/30/23 2131   Or   acetaminophen (TYLENOL) suppository 650 mg  650 mg Rectal Q6H PRN Kathleene Hazel, MD       [START ON 08/04/2023] amiodarone (PACERONE) tablet 200 mg  200 mg Oral Daily Little Ishikawa, MD       apixaban Everlene Balls) tablet 5 mg  5 mg Oral BID Little Ishikawa, MD   5 mg at 08/03/23 1349   colchicine tablet 0.6 mg  0.6 mg Oral Daily Little Ishikawa, MD   0.6 mg at 08/03/23 0936   diltiazem (CARDIZEM) injection 10 mg  10 mg Intravenous Q6H PRN Kathleene Hazel, MD       irbesartan (AVAPRO) tablet 150 mg  150 mg Oral Daily Cyndi Bender, NP   150 mg at 08/03/23 0936   lamoTRIgine (LAMICTAL) tablet 150 mg  150 mg Oral Daily Kathleene Hazel, MD   150 mg at 08/03/23 0936   loperamide (IMODIUM) capsule 2 mg  2 mg Oral Q6H PRN Kathleene Hazel, MD   2 mg at 07/27/23 1229   methimazole (TAPAZOLE) tablet 10 mg  10 mg Oral Daily Kathleene Hazel, MD   10 mg at 08/03/23 1914   metoprolol tartrate (LOPRESSOR) tablet 25 mg  25 mg Oral BID Kathleene Hazel, MD   25 mg at 08/03/23 0936   montelukast (SINGULAIR) tablet 10 mg  10 mg Oral QHS Kathleene Hazel, MD   10 mg at 08/02/23 2036   nitroGLYCERIN (NITROSTAT) SL tablet 0.4 mg  0.4 mg Sublingual Q5 min PRN Kathleene Hazel, MD   0.4 mg at 07/25/23 0354   Oral care mouth rinse  15 mL  Mouth Rinse 4 times per day Hughie Closs, MD   15 mL at 08/03/23 1525   Oral care mouth rinse  15 mL Mouth Rinse PRN Hughie Closs, MD       OXcarbazepine (TRILEPTAL) tablet 300 mg  300 mg Oral BID Kathleene Hazel, MD   300 mg at 08/03/23 0937   pantoprazole (PROTONIX) EC tablet 40 mg  40 mg Oral Daily Kathleene Hazel, MD   40 mg at 08/03/23 7829   polyethylene glycol (MIRALAX / GLYCOLAX) packet 17 g  17 g Oral Daily PRN Kathleene Hazel, MD       pravastatin (PRAVACHOL) tablet 40 mg  40 mg Oral QAC breakfast Kathleene Hazel, MD   40 mg at 08/03/23 0936   pyridostigmine (MESTINON) tablet 60 mg  60 mg Oral Q8H Kathleene Hazel, MD   60 mg at 08/03/23 1350   sertraline (ZOLOFT) tablet 50 mg  50 mg Oral Daily Kathleene Hazel, MD   50 mg at 08/03/23 [id number]  sodium chloride flush (NS) 0.9 % injection 3 mL  3 mL Intravenous Q12H [name] D, MD   3 mL at 07/30/23 2127   sodium chloride flush (NS) 0.9 % injection 3 mL  3 mL Intravenous Q12H [name] D, MD   3 mL at 07/31/23 1102   sodium chloride flush (NS) 0.9 % injection 3 mL  3 mL Intravenous PRN Kathleene Hazel, MD       spironolactone (ALDACTONE) tablet 12.5 mg  12.5 mg Oral Daily Kathleene Hazel, MD   12.5 mg at 08/03/23 0272   valACYclovir (VALTREX) tablet 1,000 mg  1,000 mg Oral Q12H PRN Kathleene Hazel, MD         Discharge Medications: Please see discharge summary for a list of discharge medications.  Relevant Imaging Results:  Relevant Lab Results:   Additional Information [id number]  Delilah Shan, LCSWA

## 2023-08-04 DIAGNOSIS — J90 Pleural effusion, not elsewhere classified: Secondary | ICD-10-CM | POA: Diagnosis not present

## 2023-08-04 LAB — MAGNESIUM: Magnesium: 1.7 mg/dL (ref 1.7–2.4)

## 2023-08-04 MED ORDER — POTASSIUM CHLORIDE CRYS ER 20 MEQ PO TBCR
40.0000 meq | EXTENDED_RELEASE_TABLET | Freq: Once | ORAL | Status: AC
  Administered 2023-08-04: 40 meq via ORAL
  Filled 2023-08-04: qty 2

## 2023-08-04 NOTE — Progress Notes (Signed)
TRH night cross cover note:   I was notified by RN of the pt's potassium level of 3.1 this AM. I subsequently ordered Kcl 40 meq po x 1 dose now.      Newton Pigg, DO Hospitalist

## 2023-08-04 NOTE — NC FL2 (Signed)
[place] MEDICAID FL2 LEVEL OF CARE FORM     IDENTIFICATION  Patient Name: Kristen Hansen Birthdate: [birth year] Sex: female Admission Date (Current Location): 07/25/2023  [facility] and [place] Number:  Producer, television/film/video and Address:  [facility] [place]. [facility], [address] [place], [place] [address]      Provider Number: [id number]  Attending Physician Name and Address:  Barnetta Chapel, MD  Relative Name and Phone Number:  [name] (spouse) [phone]    Current Level of Care: Hospital Recommended Level of Care: Skilled Nursing Facility Prior Approval Number:    Date Approved/Denied:   PASRR Number: [id number]  Discharge Plan: SNF    Current Diagnoses: Patient Active Problem List   Diagnosis Date Noted   PAF (paroxysmal atrial fibrillation) (HCC) 07/30/2023   Need for management of chest tube 07/30/2023   Acute idiopathic pericarditis 07/26/2023   Pleural effusion 07/25/2023   Chest pain 07/25/2023   Sjogren's syndrome (HCC) 07/25/2023   Hyperthyroidism 06/07/2023   Lewy body dementia without behavioral disturbance, psychotic disturbance, mood disturbance, or anxiety (HCC) 06/08/2022   Spinal stenosis of lumbar region 06/08/2022   Gastroesophageal reflux disease with esophagitis 04/19/2022   Speech disturbance 12/06/2021   Depression 12/06/2021   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) 09/22/2021   Mixed hyperlipidemia 08/23/2021   Other peripheral vertigo, unspecified ear 01/20/2021   Chronic heart failure with preserved ejection fraction (HCC) 11/08/2020   History of fusion of cervical spine 09/27/2020   Transient alteration of awareness 09/27/2020   Orthostatic hypotension 09/27/2020   Cervical radiculopathy 01/06/2020   Lumbar radiculopathy 01/06/2020   CKD (chronic kidney disease) stage 3, GFR 30-59 ml/min (HCC) 07/21/2019   Numbness 05/26/2019   Chronic bilateral low back pain without sciatica 09/05/2018    Arthrofibrosis of knee joint, right 02/27/2018   Facet syndrome 04/30/2017   Excessive sleepiness 10/31/2016   Avitaminosis D 03/30/2016   Idiopathic peripheral neuropathy 02/23/2016   HTN (hypertension) 02/22/2016   Other fatigue 10/20/2015   Trigeminal neuralgia 03/30/2015   REM behavioral disorder 03/30/2015   Polypharmacy 09/11/2014   Other long term (current) drug therapy 09/11/2014   Arthritis or polyarthritis, rheumatoid (HCC) 05/14/2014   Generalized OA 05/14/2014   Rheumatoid arthritis (HCC) 05/14/2014   Postop Hyponatremia 01/23/2013   Postoperative anemia due to acute blood loss 01/23/2013   Pain due to unicompartmental arthroplasty of knee (HCC) 01/22/2013   Pain due to knee joint prosthesis (HCC) 01/22/2013   Mucositis oral 08/20/2012    Orientation RESPIRATION BLADDER Height & Weight     Self, Place  Normal Incontinent, External catheter (Eternal Urinary catheter) Weight: 123 lb 0.3 oz (55.8 kg) Height:  5\' 10"  (177.8 cm)  BEHAVIORAL SYMPTOMS/MOOD NEUROLOGICAL BOWEL NUTRITION STATUS      Incontinent Diet (Please see discharge summary)  AMBULATORY STATUS COMMUNICATION OF NEEDS Skin   Limited Assist Verbally Other (Comment) (Appropriate for ethnicity,Erythema,perineum,sacrum,Bil.,Wound/Incision LDAs,PI sacrum medial,deep tissue,PRN)                       Personal Care Assistance Level of Assistance  Feeding, Bathing, Dressing Bathing Assistance: Maximum assistance Feeding assistance: Limited assistance Dressing Assistance: Maximum assistance     Functional Limitations Info  Sight, Hearing, Speech Sight Info:  (Reading glasses) Hearing Info: Adequate Speech Info: Adequate    SPECIAL CARE FACTORS FREQUENCY  PT (By licensed PT), OT (By licensed OT)     PT Frequency: 5x min weekly OT Frequency: 5x  min weekly            Contractures Contractures Info: Not present    Additional Factors Info  Code Status, Allergies, Psychotropic Code Status Info:  FULL Allergies Info: Morphine And Codeine,Prevacid (lansoprazole),Provigil (modafinil),Adhesive (tape) Psychotropic Info: OXcarbazepine (TRILEPTAL) tablet 300 mg 2 times daily,sertraline (ZOLOFT) tablet 50 mg daily         Current Medications (08/04/2023):  This is the current hospital active medication list Current Facility-Administered Medications  Medication Dose Route Frequency Provider Last Rate Last Admin   0.9 %  sodium chloride infusion  250 mL Intravenous PRN Kathleene Hazel, MD       acetaminophen (TYLENOL) tablet 650 mg  650 mg Oral Q6H PRN Kathleene Hazel, MD   650 mg at 07/30/23 2131   Or   acetaminophen (TYLENOL) suppository 650 mg  650 mg Rectal Q6H PRN Kathleene Hazel, MD       amiodarone (PACERONE) tablet 200 mg  200 mg Oral Daily Little Ishikawa, MD   200 mg at 08/04/23 0914   apixaban (ELIQUIS) tablet 5 mg  5 mg Oral BID Little Ishikawa, MD   5 mg at 08/04/23 7829   colchicine tablet 0.6 mg  0.6 mg Oral Daily Little Ishikawa, MD   0.6 mg at 08/04/23 0914   diltiazem (CARDIZEM) injection 10 mg  10 mg Intravenous Q6H PRN Kathleene Hazel, MD       irbesartan (AVAPRO) tablet 150 mg  150 mg Oral Daily Cyndi Bender, NP   150 mg at 08/04/23 0914   lamoTRIgine (LAMICTAL) tablet 150 mg  150 mg Oral Daily Kathleene Hazel, MD   150 mg at 08/04/23 0914   loperamide (IMODIUM) capsule 2 mg  2 mg Oral Q6H PRN Kathleene Hazel, MD   2 mg at 07/27/23 1229   methimazole (TAPAZOLE) tablet 10 mg  10 mg Oral Daily Kathleene Hazel, MD   10 mg at 08/04/23 0915   metoprolol tartrate (LOPRESSOR) tablet 25 mg  25 mg Oral BID Kathleene Hazel, MD   25 mg at 08/04/23 0914   montelukast (SINGULAIR) tablet 10 mg  10 mg Oral QHS Kathleene Hazel, MD   10 mg at 08/03/23 2058   nitroGLYCERIN (NITROSTAT) SL tablet 0.4 mg  0.4 mg Sublingual Q5 min PRN Kathleene Hazel, MD   0.4 mg at 07/25/23 0354   Oral care  mouth rinse  15 mL Mouth Rinse 4 times per day Hughie Closs, MD   15 mL at 08/04/23 1112   Oral care mouth rinse  15 mL Mouth Rinse PRN Hughie Closs, MD       OXcarbazepine (TRILEPTAL) tablet 300 mg  300 mg Oral BID Kathleene Hazel, MD   300 mg at 08/04/23 0914   pantoprazole (PROTONIX) EC tablet 40 mg  40 mg Oral Daily Kathleene Hazel, MD   40 mg at 08/04/23 0914   polyethylene glycol (MIRALAX / GLYCOLAX) packet 17 g  17 g Oral Daily PRN Kathleene Hazel, MD       pravastatin (PRAVACHOL) tablet 40 mg  40 mg Oral QAC breakfast Kathleene Hazel, MD   40 mg at 08/04/23 0914   pyridostigmine (MESTINON) tablet 60 mg  60 mg Oral Q8H Kathleene Hazel, MD   60 mg at 08/04/23 0547   sertraline (ZOLOFT) tablet 50 mg  50 mg Oral Daily Kathleene Hazel, MD   50 mg at 08/04/23 [id number]  sodium chloride flush (NS) 0.9 % injection 3 mL  3 mL Intravenous Q12H [name] D, MD   3 mL at 07/30/23 2127   sodium chloride flush (NS) 0.9 % injection 3 mL  3 mL Intravenous Q12H Kathleene Hazel, MD   3 mL at 08/04/23 0914   sodium chloride flush (NS) 0.9 % injection 3 mL  3 mL Intravenous PRN Kathleene Hazel, MD       spironolactone (ALDACTONE) tablet 12.5 mg  12.5 mg Oral Daily Kathleene Hazel, MD   12.5 mg at 08/04/23 0914   valACYclovir (VALTREX) tablet 1,000 mg  1,000 mg Oral Q12H PRN Kathleene Hazel, MD         Discharge Medications: Please see discharge summary for a list of discharge medications.  Relevant Imaging Results:  Relevant Lab Results:   Additional Information [id number]  Carmina Miller, LCSWA

## 2023-08-04 NOTE — Plan of Care (Signed)
  Problem: Education: Goal: Knowledge of General Education information will improve Description: Including pain rating scale, medication(s)/side effects and non-pharmacologic comfort measures Outcome: Progressing   Problem: Health Behavior/Discharge Planning: Goal: Ability to manage health-related needs will improve Outcome: Progressing   Problem: Clinical Measurements: Goal: Ability to maintain clinical measurements within normal limits will improve Outcome: Progressing Goal: Will remain free from infection Outcome: Progressing Goal: Diagnostic test results will improve Outcome: Progressing Goal: Respiratory complications will improve Outcome: Progressing Goal: Cardiovascular complication will be avoided Outcome: Progressing   Problem: Activity: Goal: Risk for activity intolerance will decrease Outcome: Progressing   Problem: Nutrition: Goal: Adequate nutrition will be maintained Outcome: Progressing   Problem: Coping: Goal: Level of anxiety will decrease Outcome: Progressing   Problem: Elimination: Goal: Will not experience complications related to bowel motility Outcome: Progressing Goal: Will not experience complications related to urinary retention Outcome: Progressing   Problem: Pain Managment: Goal: General experience of comfort will improve Outcome: Progressing   Problem: Safety: Goal: Ability to remain free from injury will improve Outcome: Progressing   Problem: Skin Integrity: Goal: Risk for impaired skin integrity will decrease Outcome: Progressing   Problem: Activity: Goal: Ability to tolerate increased activity will improve Outcome: Progressing   Problem: Clinical Measurements: Goal: Ability to maintain a body temperature in the normal range will improve Outcome: Progressing   Problem: Respiratory: Goal: Ability to maintain adequate ventilation will improve Outcome: Progressing Goal: Ability to maintain a clear airway will improve Outcome:  Progressing   Problem: Education: Goal: Understanding of CV disease, CV risk reduction, and recovery process will improve Outcome: Progressing Goal: Individualized Educational Video(s) Outcome: Progressing   Problem: Activity: Goal: Ability to return to baseline activity level will improve Outcome: Progressing   Problem: Cardiovascular: Goal: Ability to achieve and maintain adequate cardiovascular perfusion will improve Outcome: Progressing Goal: Vascular access site(s) Level 0-1 will be maintained Outcome: Progressing   Problem: Health Behavior/Discharge Planning: Goal: Ability to safely manage health-related needs after discharge will improve Outcome: Progressing

## 2023-08-04 NOTE — TOC Progression Note (Signed)
Transition of Care [facility]) - Progression Note    Patient Details  Name: Kristen Hansen MRN: [id number] Date of Birth: [birth year]  Transition of Care [facility]) CM/SW Contact  Carmina Miller, [place] Phone Number: 08/04/2023, 11:54 AM  Clinical Narrative:     CSW spoke with pt and spouse via phone, bed offers provided.   Expected Discharge Plan: Skilled Nursing Facility Barriers to Discharge: Continued Medical Work up  Expected Discharge Plan and Services In-house Referral: Clinical Social Work Discharge Planning Services: CM Consult   Living arrangements for the past 2 months: Single Family Home                   DME Agency: NA                   Social Determinants of Health (SDOH) Interventions SDOH Screenings   Food Insecurity: No Food Insecurity (07/25/2023)  Housing: Low Risk  (07/25/2023)  Transportation Needs: No Transportation Needs (07/25/2023)  Utilities: Not At Risk (07/25/2023)  Financial Resource Strain: Low Risk  (08/22/2022)   [facility], [facility] prior to [facility]/24/[facility]23[facility] visits prior to 02/24/2023.  Physical Activity: Inactive (08/22/2022)   [facility], [facility] prior to 02/24/2023., [facility] visits prior to 02/24/2023.  Social Connections: Socially Integrated (08/22/2022)   [facility], [facility] prior to 02/24/2023., [name] visits prior to 02/24/2023.  Stress: No Stress Concern Present (08/22/2022)   [facility] visits prior to 02/24/2023., [facility] visits prior to 02/24/2023.  Tobacco Use: Low Risk  (07/25/2023)    Readmission Risk Interventions     No data to display

## 2023-08-04 NOTE — Progress Notes (Signed)
PROGRESS NOTE    [name]  KZS:[id number] DOB: 1946/10/13 DOA: 07/25/2023 PCP: Kerin Salen, PA-C   Brief Narrative:  Kristen Hansen is a 77 y.o. female with medical history significant of hypertension, hyperlipidemia, Lewy body dementia, Sjogren's syndrome, rheumatoid arthritis, chronic pain, DVT, spinal stenosis, diastolic CHF, depression, REM behavior disorder, orthostatic hypotension, CKD 3, GERD, hyperthyroidism presenting with chest pain. Her chest pain has atypical nature and present for 2 weeks intermittently. She was found to have a moderate pleural effusion. Cardiology consulted, s/p cardiac cath with clean coronaries.    08/03/2023: Patient seen alongside patient's daughter.  Also discussed with patient's husband over the phone.  No new complaints.  Patient continues to improve.  Patient has done on colchicine.  Plan is to discharge patient to [facility] for short-term rehab.  08/04/2023: Patient seen alongside patient's husband.  No new complaints.  Patient is awaiting disposition.  Continue to optimize blood pressure.  Assessment & Plan:   Principal Problem:   Pleural effusion Active Problems:   Rheumatoid arthritis (HCC)   HTN (hypertension)   Chronic bilateral low back pain without sciatica   Depression   Lewy body dementia without behavioral disturbance, psychotic disturbance, mood disturbance, or anxiety (HCC)   Spinal stenosis of lumbar region   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)   Chronic heart failure with preserved ejection fraction (HCC)   CKD (chronic kidney disease) stage 3, GFR 30-59 ml/min (HCC)   Gastroesophageal reflux disease with esophagitis   Mixed hyperlipidemia   Chest pain   Sjogren's syndrome (HCC)   Acute idiopathic pericarditis   PAF (paroxysmal atrial fibrillation) (HCC)   Need for management of chest tube   Chest Pain, exudative and recurrent right-sided pleural Effusion, Pericardial Effusion - Chest pain  improved after thoracentesis. Given re-accumulation of fluid and exudative nature, PCCM was consulted. Appreciate their assistance. They placed a chest tube and are managing that.  Still with chest tube to suction.  On discharge, will have her follow-up with pulmonary outpatient along with rheumatology.    Acute on chronic diastolic CHF EF 50 to 55%, and some preceding history of exertional shortness of breath.   Pt had episode of worsening dyspnea on morning of 08/04 with coughing, CXR obtained showed volume overload. She was given IV lasix with improvement in her symptoms.  Cardiology on board.  Underwent cath which showed clean coronaries.  Has been started on Lasix 20 mg p.o. daily on 08/01/2023. 08/03/2023: Stable.   Pericardial Effusion She has mild pericarditis and is currently on colchicine for pericardial effusion along with aspirin per cardiology. 08/03/2023: Treat pericarditis for 3 months with colchicine.   Paroxysmal Atrial Fibrillation: Cardiology following, rates controlled, on amiodarone and metoprolol oral as well as heparin drip, will switch to DOAC once chest tube is out.  Dysphagia: OP work up ongoing. EGD without any acute findings. Getting swallowing therapy. SLP following pt. Repeat MBBS shows pt has some retention of contrast and some aspiration concern but overall unchanged from prior study and likely secondary to progressing lewy body dementia. Placed on Dys 3 Diet here. Will need follow up with palliative care OP (see below).    Lewy Body Dementia: On pyridostigmine. Last saw neurologist one month ago. Appears to be progressing and pt may benefit from palliative care consult (see below).   Acute encephalopathy/delirium: Resolved as currently she is fully alert and oriented to person place and year but not month.  Husband at the bedside verifies  that this is her baseline.   GOC: Pt with lewey body dementia and multiple co-morbidities and worsening dysphagia. She may benefit from  OP palliative care consult.    Orthostatic Hypotension: Pt with orthostatic hypotension. Suspect this is secondary to her lewy body dementia.  Continue TED hose.  Would encourage her to change positions slowly. Per husband, she has some dizziness at home.    Hyponatremia: Na 132, stable.    Hypomagnesemia/Hypokalemia: Mag is normal but potassium low.  Will replace.   Hx of DVT: D dimer elevated,  CTA negative, lower extremity doppler which was negative for DVT.  Now on heparin drip for A-fib.     Hyperthyroidism: Recently started on methimazole. States she is complaint with this. Will continue here. FT4 normal and FT3 slightly decreased at 1.7. Thyroid ultrasound ordered and shows bilateral nodules that do not meet criteria for biopsy for dedicated follow up.  Will need OP endocrinology follow up.    Left Shoulder Pain: Has good PROM but pain with active ROM. Concern for RT injury. 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 Conservative measures for now.  Overall better, outpatient orthopedics follow-up postdischarge. PCP to arrange.   HTN:  Home meds: amlodipine 5 mg every day, irbesartan 300 mg every day, lopressor 25 mg BID.  Patient currently on Aldactone, irbesartan, metoprolol.  Blood pressure fairly controlled.   GERD: Continue home med protonix. Does not endorse symptoms of reflux.    Sjogren's Syndrome: Not on any pharmacotherapy.  Likely causing pericardial effusion and exudative right-sided pleural effusion, currently on colchicine for pericardial effusion may require outpatient rheumatology follow-up postdischarge.   RA: Was on enbrel. Last used 2 months ago.    Chronic back pain: Tylenol prn   MDD: Zoloft 50 mg every day. Continued here.    Trigeminal Neuralgia: On lamictal and trileptal. Both continued here.  Code stroke/ ? TIA: Patient was noted to have  right-sided weakness by RN at around 6:30 AM. LKN was at about 9:00 PM last night when RN administered her meds. The patient was drowsy and poorly cooperative at that time, which were attributed to a recent Klonopin dose .  Code stroke was called, seen by neurology.  Underwent CT head, CTA head and neck and MRI brain and was ruled out of stroke.  EEG negative as well.  Patient back at her baseline mental status.  Neurology signed off.   Obesity: Estimated body mass index is 17.65 kg/m as calculated from the following:   Height as of this encounter: 5\' 10"  (1.778 m).   Weight as of this encounter: 55.8 kg.   DVT prophylaxis: Place TED hose Start: 07/27/23 1141   Code Status: Full Code  Family Communication: Husband present at bedside.   Status is: Inpatient Remains inpatient appropriate because: Patient with chest tube   Estimated body mass index is 17.65 kg/m as calculated from the following:   Height as of this encounter: 5\' 10"  (1.778 m).   Weight as of this encounter: 55.8 kg.  Pressure Injury 08/01/23 Sacrum Medial Deep Tissue Pressure Injury - Purple or maroon localized area of discolored intact skin or blood-filled blister due to damage of underlying soft tissue from pressure and/or shear. (Active)  08/01/23 0000  Location: Sacrum  Location Orientation: Medial  Staging: Deep Tissue Pressure Injury - Purple or maroon localized area of discolored intact skin or blood-filled blister  due to damage of underlying soft tissue from pressure and/or shear.  Wound Description (Comments):   Present on Admission:   Dressing Type Foam - Lift dressing to assess site every shift 08/04/23 0900   Nutritional Assessment: Body mass index is 17.65 kg/m.Marland Kitchen Seen by dietician.  I agree with the assessment and plan as outlined below: Nutrition Status:        . Skin Assessment: I have examined the patient's skin and I agree with the wound assessment as performed by the wound care RN as outlined  below: Pressure Injury 08/01/23 Sacrum Medial Deep Tissue Pressure Injury - Purple or maroon localized area of discolored intact skin or blood-filled blister due to damage of underlying soft tissue from pressure and/or shear. (Active)  08/01/23 0000  Location: Sacrum  Location Orientation: Medial  Staging: Deep Tissue Pressure Injury - Purple or maroon localized area of discolored intact skin or blood-filled blister due to damage of underlying soft tissue from pressure and/or shear.  Wound Description (Comments):   Present on Admission:   Dressing Type Foam - Lift dressing to assess site every shift 08/04/23 0900    Consultants:  Neurology, critical care and cardiology  Procedures:  As above  Antimicrobials:  Anti-infectives (From admission, onward)    Start     Dose/Rate Route Frequency Ordered Stop   08/01/23 1600  piperacillin-tazobactam (ZOSYN) IVPB 3.375 g  Status:  Discontinued        3.375 g 12.5 mL/hr over 240 Minutes Intravenous Every 8 hours 08/01/23 0843 08/02/23 1547   08/01/23 0930  piperacillin-tazobactam (ZOSYN) IVPB 3.375 g        3.375 g 100 mL/hr over 30 Minutes Intravenous  Once 08/01/23 0843 08/01/23 1042   07/26/23 0540  valACYclovir (VALTREX) tablet 1,000 mg        1,000 mg Oral Every 12 hours PRN 07/26/23 0541           Subjective: Patient seen No chest pain No shortness of breath.  Objective: Vitals:   08/03/23 1924 08/04/23 0040 08/04/23 0436 08/04/23 1352  BP: 115/67 (!) 148/67 (!) 167/68   Pulse: 75 60 (!) 58 (!) 57  Resp: 16 19 18    Temp: 97.8 F (36.6 C) 98 F (36.7 C) 98.4 F (36.9 C) 98 F (36.7 C)  TempSrc: Oral Oral Oral Oral  SpO2: 98% 97% 98%   Weight:   55.8 kg   Height:        Intake/Output Summary (Last 24 hours) at 08/04/2023 1816 Last data filed at 08/04/2023 8119 Gross per 24 hour  Intake 480 ml  Output 250 ml  Net 230 ml   Filed Weights   08/02/23 0445 08/03/23 0535 08/04/23 0436  Weight: 52.9 kg 50.2 kg 55.8 kg     Examination:  General exam: Patient is thin.  Parkinsonian features.  Not in any distress.   Respiratory system: Decreased air entry.   Cardiovascular system: S1 & S2 heard. Gastrointestinal system: Abdomen is soft and nontender.   Central nervous system: Awake and alert.  Moves all extremities.  Parkinsonian features.   Extremities: No leg edema.   Data Reviewed: I have personally reviewed following labs and imaging studies  CBC: Recent Labs  Lab 07/30/23 0418 07/30/23 1225 07/30/23 1226 07/31/23 0714 08/01/23 0638 08/02/23 0149 08/03/23 0628  WBC 9.8  --   --  14.1* 6.7 6.9 7.1  NEUTROABS 7.0  --   --  11.5* 4.8 4.7 4.8  HGB 10.9*   < >  9.9* 12.2 9.8* 9.3* 9.7*  HCT 33.5*   < > 29.0* 37.7 30.9* 28.2* 30.8*  MCV 83.3  --   --  82.0 82.6 82.9 83.0  PLT 263  --   --  307 266 279 287   < > = values in this interval not displayed.   Basic Metabolic Panel: Recent Labs  Lab 07/31/23 0714 08/01/23 1610 08/02/23 0149 08/03/23 0628 08/04/23 0154 08/04/23 0230  NA 130* 132* 132* 133* 130*  --   K 4.6 3.4* 3.3* 3.2* 3.1*  --   CL 94* 100 102 102 101  --   CO2 21* 19* 21* 21* 21*  --   GLUCOSE 73 71 89 105* 88  --   BUN 17 13 11 9  6*  --   CREATININE 1.15* 0.88 1.23* 0.90 1.19*  --   CALCIUM 8.9 8.1* 7.7* 8.2* 8.3*  --   MG 1.7 1.6* 1.9 1.7  --  1.7   GFR: Estimated Creatinine Clearance: 35.4 mL/min (A) (by C-G formula based on SCr of 1.19 mg/dL (H)). Liver Function Tests: Recent Labs  Lab 07/30/23 0418 07/31/23 0714 08/01/23 0638 08/02/23 0149 08/03/23 0628  AST 24 43* 26 31 21   ALT 17 27 22 23 21   ALKPHOS 143* 164* 141* 131* 133*  BILITOT 0.5 0.4 0.5 0.3 0.4  PROT 6.0* 6.1* 4.9* 4.7* 5.1*  ALBUMIN 2.4* 2.5* 1.9* 1.9* 2.1*   No results for input(s): "LIPASE", "AMYLASE" in the last 168 hours. Recent Labs  Lab 07/31/23 1347  AMMONIA 21   Coagulation Profile: No results for input(s): "INR", "PROTIME" in the last 168 hours.  Cardiac Enzymes: No  results for input(s): "CKTOTAL", "CKMB", "CKMBINDEX", "TROPONINI" in the last 168 hours. BNP (last 3 results) No results for input(s): "PROBNP" in the last 8760 hours. HbA1C: No results for input(s): "HGBA1C" in the last 72 hours. CBG: Recent Labs  Lab 07/30/23 2327 07/31/23 0627  GLUCAP 97 77   Lipid Profile: No results for input(s): "CHOL", "HDL", "LDLCALC", "TRIG", "CHOLHDL", "LDLDIRECT" in the last 72 hours. Thyroid Function Tests: No results for input(s): "TSH", "T4TOTAL", "FREET4", "T3FREE", "THYROIDAB" in the last 72 hours. Anemia Panel: No results for input(s): "VITAMINB12", "FOLATE", "FERRITIN", "TIBC", "IRON", "RETICCTPCT" in the last 72 hours. Sepsis Labs: Recent Labs  Lab 07/31/23 0902 08/01/23 0638 08/02/23 0149 08/03/23 0628  PROCALCITON 0.16 0.24 0.18 <0.10    Recent Results (from the past 240 hour(s))  Culture, body fluid w Gram Stain-bottle     Status: None   Collection Time: 07/26/23 12:33 PM   Specimen: Pleura  Result Value Ref Range Status   Specimen Description PLEURAL  Final   Special Requests PLEURAL  Final   Culture   Final    NO GROWTH 5 DAYS Performed at [facility], [address], [place], [place] [address]    Report Status 07/31/2023 FINAL  Final  Gram stain     Status: None   Collection Time: 07/26/23 12:33 PM   Specimen: Pleura  Result Value Ref Range Status   Specimen Description PLEURAL  Final   Special Requests NONE  Final   Gram Stain   Final    RARE WBC PRESENT,BOTH PMN AND MONONUCLEAR NO ORGANISMS SEEN Performed at [facility], [address] [place], [place] [address]    Report Status 07/26/2023 FINAL  Final  Respiratory (~20 pathogens) panel by PCR     Status: None   Collection Time: 07/27/23 12:28 PM  Specimen: Nasopharyngeal Swab; Respiratory  Result Value Ref Range Status   Adenovirus NOT DETECTED NOT DETECTED Final   Coronavirus 229E NOT DETECTED NOT DETECTED Final    Comment: (NOTE) The Coronavirus  on the Respiratory Panel, DOES NOT test for the novel  Coronavirus (2019 nCoV)    Coronavirus HKU1 NOT DETECTED NOT DETECTED Final   Coronavirus NL63 NOT DETECTED NOT DETECTED Final   Coronavirus OC43 NOT DETECTED NOT DETECTED Final   Metapneumovirus NOT DETECTED NOT DETECTED Final   Rhinovirus / Enterovirus NOT DETECTED NOT DETECTED Final   Influenza A NOT DETECTED NOT DETECTED Final   Influenza B NOT DETECTED NOT DETECTED Final   Parainfluenza Virus 1 NOT DETECTED NOT DETECTED Final   Parainfluenza Virus 2 NOT DETECTED NOT DETECTED Final   Parainfluenza Virus 3 NOT DETECTED NOT DETECTED Final   Parainfluenza Virus 4 NOT DETECTED NOT DETECTED Final   Respiratory Syncytial Virus NOT DETECTED NOT DETECTED Final   Bordetella pertussis NOT DETECTED NOT DETECTED Final   Bordetella Parapertussis NOT DETECTED NOT DETECTED Final   Chlamydophila pneumoniae NOT DETECTED NOT DETECTED Final   Mycoplasma pneumoniae NOT DETECTED NOT DETECTED Final    Comment: Performed at [facility], [address], [place], [place] [address]  Body fluid culture w Gram Stain     Status: None   Collection Time: 08/01/23  7:25 AM   Specimen: Pleura; Body Fluid  Result Value Ref Range Status   Specimen Description PLEURAL RIGHT  Final   Special Requests NONE  Final   Gram Stain NO WBC SEEN NO ORGANISMS SEEN   Final   Culture   Final    NO GROWTH 3 DAYS Performed at [facility], [address], [place], [place] [address]    Report Status 08/04/2023 FINAL  Final     Radiology Studies: DG CHEST PORT 1 VIEW  Result Date: 08/03/2023 CLINICAL DATA:  77 year old female with history of chest tube. EXAM: PORTABLE CHEST 1 VIEW COMPARISON:  Chest x-ray 07/31/2023. FINDINGS: Previously noted small bore right-sided chest tube has been removed. Probable trace pneumothorax visualized in the apex. There is also a small right pleural effusion. Poorly defined opacity in the right base likely reflects residual  airspace consolidation from bronchopneumonia. Left lung is clear. No left pleural effusion. No evidence of pulmonary edema. Heart size is normal. Upper mediastinal contours are distorted by patient's rotation to the left. IMPRESSION: 1. Interval removal of right-sided chest tube. Trace right hydropneumothorax noted. 2. Probable right lower lobe bronchopneumonia. Electronically Signed   By: Trudie Reed M.D.   On: 08/03/2023 08:25    Scheduled Meds:  amiodarone  200 mg Oral Daily   apixaban  5 mg Oral BID   colchicine  0.6 mg Oral Daily   irbesartan  150 mg Oral Daily   lamoTRIgine  150 mg Oral Daily   methIMAzole  10 mg Oral Daily   metoprolol tartrate  25 mg Oral BID   montelukast  10 mg Oral QHS   mouth rinse  15 mL Mouth Rinse 4 times per day   Oxcarbazepine  300 mg Oral BID   pantoprazole  40 mg Oral Daily   pravastatin  40 mg Oral QAC breakfast   pyridostigmine  60 mg Oral Q8H   sertraline  50 mg Oral Daily   sodium chloride flush  3 mL Intravenous Q12H   sodium chloride flush  3 mL Intravenous Q12H   spironolactone  12.5 mg Oral Daily  Continuous Infusions:  sodium chloride       LOS: 10 days   Barnetta Chapel, MD [facility]  08/04/2023, 6:16 PM   *Please note that this is a verbal dictation therefore any spelling or grammatical errors are due to the "Dragon Medical One" system interpretation.  Please Raap via Amion and do not message via secure chat for urgent patient care matters. Secure chat can be used for non urgent patient care matters.  How to contact the [facility] Attending or Consulting provider 7A - 7P or covering provider during after hours 7P -7A, for this patient?  Check the care team in [facility] and look for a) attending/consulting [facility] provider listed and b) the [facility] team listed. [place] or secure chat 7A-7P. Log into [email or web address] and use [place]'s universal password to access. If you do not have the password, please contact the hospital  operator. Locate the [facility] provider you are looking for under [facility] and [place] to a number that you can be directly reached. If you still have difficulty reaching the provider, please [place] the [facility] (Director on Call) for the Hospitalists listed on amion for assistance.

## 2023-08-04 NOTE — Plan of Care (Signed)
  Problem: Education: Goal: Knowledge of General Education information will improve Description: Including pain rating scale, medication(s)/side effects and non-pharmacologic comfort measures Outcome: Progressing   Problem: Health Behavior/Discharge Planning: Goal: Ability to manage health-related needs will improve Outcome: Progressing   Problem: Clinical Measurements: Goal: Respiratory complications will improve Outcome: Progressing   Problem: Clinical Measurements: Goal: Cardiovascular complication will be avoided Outcome: Progressing   Problem: Activity: Goal: Risk for activity intolerance will decrease Outcome: Progressing   Problem: Nutrition: Goal: Adequate nutrition will be maintained Outcome: Progressing   Problem: Elimination: Goal: Will not experience complications related to bowel motility Outcome: Progressing   Problem: Pain Managment: Goal: General experience of comfort will improve Outcome: Progressing   Problem: Safety: Goal: Ability to remain free from injury will improve Outcome: Progressing   Problem: Skin Integrity: Goal: Risk for impaired skin integrity will decrease Outcome: Progressing   Problem: Cardiovascular: Goal: Ability to achieve and maintain adequate cardiovascular perfusion will improve Outcome: Progressing Goal: Vascular access site(s) Level 0-1 will be maintained Outcome: Progressing

## 2023-08-05 DIAGNOSIS — J90 Pleural effusion, not elsewhere classified: Secondary | ICD-10-CM | POA: Diagnosis not present

## 2023-08-05 MED ORDER — GUAIFENESIN ER 600 MG PO TB12
600.0000 mg | ORAL_TABLET | Freq: Two times a day (BID) | ORAL | Status: DC
  Administered 2023-08-05 – 2023-08-07 (×7): 600 mg via ORAL
  Filled 2023-08-05 (×8): qty 1

## 2023-08-05 MED ORDER — POTASSIUM CHLORIDE IN NACL 20-0.9 MEQ/L-% IV SOLN
INTRAVENOUS | Status: DC
  Filled 2023-08-05: qty 1000

## 2023-08-05 MED ORDER — MAGNESIUM SULFATE 2 GM/50ML IV SOLN
2.0000 g | Freq: Once | INTRAVENOUS | Status: AC
  Administered 2023-08-05: 2 g via INTRAVENOUS
  Filled 2023-08-05: qty 50

## 2023-08-05 MED ORDER — ALBUTEROL SULFATE (2.5 MG/3ML) 0.083% IN NEBU
2.5000 mg | INHALATION_SOLUTION | RESPIRATORY_TRACT | Status: DC | PRN
  Administered 2023-08-05: 2.5 mg via RESPIRATORY_TRACT
  Filled 2023-08-05: qty 3

## 2023-08-05 MED ORDER — POTASSIUM CHLORIDE 20 MEQ PO PACK
40.0000 meq | PACK | ORAL | Status: AC
  Administered 2023-08-05 (×2): 40 meq via ORAL
  Filled 2023-08-05 (×2): qty 2

## 2023-08-05 MED ORDER — POTASSIUM CHLORIDE IN NACL 20-0.9 MEQ/L-% IV SOLN
INTRAVENOUS | Status: DC
Start: 2023-08-05 — End: 2023-08-05

## 2023-08-05 MED ORDER — SODIUM CHLORIDE 3 % IN NEBU
4.0000 mL | INHALATION_SOLUTION | Freq: Two times a day (BID) | RESPIRATORY_TRACT | Status: DC
  Administered 2023-08-05: 4 mL via RESPIRATORY_TRACT
  Filled 2023-08-05 (×3): qty 4

## 2023-08-05 NOTE — Progress Notes (Addendum)
TRH night cross cover note:   I was notified by RN that the patient is coughing, with some secretions noted in the back of her throat although the patient is having trouble coughing this out.  RN pursuing oral suction at this time as well as flutter valve.  Patient denies any corresponding shortness of breath and does not appear to be in any acute respiratory distress.  Most recent oxygen saturations in the mid 90s on room air.  Scheduled guaifenesin ordered to help thin out the secretions.  hypertonic nebulizer also ordered.    Newton Pigg, DO Hospitalist

## 2023-08-05 NOTE — Progress Notes (Signed)
PROGRESS NOTE    Kristen Hansen  FAO:[id number] DOB: May 18, 1946 DOA: 07/25/2023 PCP: Kerin Salen, PA-C   Brief Narrative:  Kristen Hansen is a 77 y.o. female with medical history significant of hypertension, hyperlipidemia, Lewy body dementia, Sjogren's syndrome, rheumatoid arthritis, chronic pain, DVT, spinal stenosis, diastolic CHF, depression, REM behavior disorder, orthostatic hypotension, CKD 3, GERD, hyperthyroidism presenting with chest pain. Her chest pain has atypical nature and present for 2 weeks intermittently. She was found to have a moderate pleural effusion. Cardiology consulted, s/p cardiac cath with clean coronaries.    08/03/2023: Patient seen alongside patient's daughter.  Also discussed with patient's husband over the phone.  No new complaints.  Patient continues to improve.  Patient has done on colchicine.  Plan is to discharge patient to [facility] for short-term rehab.  08/04/2023: Patient seen alongside patient's husband.  No new complaints.  Patient is awaiting disposition.  Continue to optimize blood pressure.  08/05/2023: Patient seen alongside patient's husband.  No new complaints.  Low sodium and low potassium noted.  Dry buccal mucosa is noted.  Gentle hydration (normal saline plus KCl 20 mEq at 50 cc/h).  Monitor renal function and electrolytes.  Pursue disposition.  Assessment & Plan:   Principal Problem:   Pleural effusion Active Problems:   Rheumatoid arthritis (HCC)   HTN (hypertension)   Chronic bilateral low back pain without sciatica   Depression   Lewy body dementia without behavioral disturbance, psychotic disturbance, mood disturbance, or anxiety (HCC)   Spinal stenosis of lumbar region   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)   Chronic heart failure with preserved ejection fraction (HCC)   CKD (chronic kidney disease) stage 3, GFR 30-59 ml/min (HCC)   Gastroesophageal reflux disease with esophagitis   Mixed  hyperlipidemia   Chest pain   Sjogren's syndrome (HCC)   Acute idiopathic pericarditis   PAF (paroxysmal atrial fibrillation) (HCC)   Need for management of chest tube   Chest Pain, exudative and recurrent right-sided pleural Effusion, Pericardial Effusion - Chest pain improved after thoracentesis. Given re-accumulation of fluid and exudative nature, PCCM was consulted. Appreciate their assistance. They placed a chest tube and are managing that.  Still with chest tube to suction.  On discharge, will have her follow-up with pulmonary outpatient along with rheumatology.    Acute on chronic diastolic CHF EF 50 to 55%, and some preceding history of exertional shortness of breath.   Pt had episode of worsening dyspnea on morning of 08/04 with coughing, CXR obtained showed volume overload. She was given IV lasix with improvement in her symptoms.  Cardiology on board.  Underwent cath which showed clean coronaries.  Has been started on Lasix 20 mg p.o. daily on 08/01/2023. 08/03/2023: Stable. 08/05/2023: Cautious hydration.  Monitor cardiac symptoms closely.   Pericardial Effusion She has mild pericarditis and is currently on colchicine for pericardial effusion along with aspirin per cardiology. 08/03/2023: Treat pericarditis for 3 months with colchicine.   Paroxysmal Atrial Fibrillation: Cardiology following, rates controlled, on amiodarone and metoprolol oral as well as heparin drip, will switch to DOAC once chest tube is out.  Dysphagia: OP work up ongoing. EGD without any acute findings. Getting swallowing therapy. SLP following pt. Repeat MBBS shows pt has some retention of contrast and some aspiration concern but overall unchanged from prior study and likely secondary to progressing lewy body dementia. Placed on Dys 3 Diet here. Will need follow up with palliative care OP (see below).  Lewy Body Dementia: On pyridostigmine. Last saw neurologist one month ago. Appears to be progressing and pt may  benefit from palliative care consult (see below).   Acute encephalopathy/delirium: Resolved as currently she is fully alert and oriented to person place and year but not month.  Husband at the bedside verifies that this is her baseline.   GOC: Pt with lewey body dementia and multiple co-morbidities and worsening dysphagia. She may benefit from OP palliative care consult.    Orthostatic Hypotension: Pt with orthostatic hypotension. Suspect this is secondary to her lewy body dementia.  Continue TED hose.  Would encourage her to change positions slowly. Per husband, she has some dizziness at home.    Hyponatremia: Na 132, stable.  Gentle hydration.   Hypomagnesemia/Hypokalemia: Mag is normal but potassium low.  Will replace.   Hx of DVT: D dimer elevated,  CTA negative, lower extremity doppler which was negative for DVT.  Now on heparin drip for A-fib.     Hyperthyroidism: Recently started on methimazole. States she is complaint with this. Will continue here. FT4 normal and FT3 slightly decreased at 1.7. Thyroid ultrasound ordered and shows bilateral nodules that do not meet criteria for biopsy for dedicated follow up.  Will need OP endocrinology follow up.    Left Shoulder Pain: Has good PROM but pain with active ROM. Concern for RT injury. MRI was obtained that showed moderate to severe glenohumeral OA and mild RT tendinosis and mild degenerative changes of the [facility] joint and mild subacromial bursitis. Conservative measures for now.  Overall better, outpatient orthopedics follow-up postdischarge. PCP to arrange.   HTN:  Home meds: amlodipine 5 mg every day, irbesartan 300 mg every day, lopressor 25 mg BID.  Patient currently on Aldactone, irbesartan, metoprolol.  Blood pressure fairly controlled.   GERD: Continue home med protonix. Does not endorse symptoms of reflux.    Sjogren's Syndrome: Not on any pharmacotherapy.  Likely causing pericardial effusion and exudative right-sided pleural  effusion, currently on colchicine for pericardial effusion may require outpatient rheumatology follow-up postdischarge.   RA: Was on enbrel. Last used 2 months ago.    Chronic back pain: Tylenol prn   MDD: Zoloft 50 mg every day. Continued here.    Trigeminal Neuralgia: On lamictal and trileptal. Both continued here.  Code stroke/ ? TIA: Patient was noted to have right-sided weakness by RN at around 6:30 AM. LKN was at about 9:00 PM last night when RN administered her meds. The patient was drowsy and poorly cooperative at that time, which were attributed to a recent Klonopin dose .  Code stroke was called, seen by neurology.  Underwent CT head, CTA head and neck and MRI brain and was ruled out of stroke.  EEG negative as well.  Patient back at her baseline mental status.  Neurology signed off.   Obesity: Estimated body mass index is 17.65 kg/m as calculated from the following:   Height as of this encounter: 5\' 10"  (1.778 m).   Weight as of this encounter: 55.8 kg.   DVT prophylaxis: Place TED hose Start: 07/27/23 1141   Code Status: Full Code  Family Communication: Husband present at bedside.   Status is: Inpatient Remains inpatient appropriate because: Patient with chest tube   Estimated body mass index is 17.68 kg/m as calculated from the following:   Height as of this encounter: 5\' 10"  (1.778 m).   Weight as of this encounter: 55.9 kg.  Pressure Injury 08/01/23 Sacrum Medial Deep Tissue Pressure Injury -  Purple or maroon localized area of discolored intact skin or blood-filled blister due to damage of underlying soft tissue from pressure and/or shear. (Active)  08/01/23 0000  Location: Sacrum  Location Orientation: Medial  Staging: Deep Tissue Pressure Injury - Purple or maroon localized area of discolored intact skin or blood-filled blister due to damage of underlying soft tissue from pressure and/or shear.  Wound Description (Comments):   Present on Admission:   Dressing  Type Foam - Lift dressing to assess site every shift 08/05/23 0900   Nutritional Assessment: Body mass index is 17.68 kg/m.Marland Kitchen Seen by dietician.  I agree with the assessment and plan as outlined below: Nutrition Status:        . Skin Assessment: I have examined the patient's skin and I agree with the wound assessment as performed by the wound care RN as outlined below: Pressure Injury 08/01/23 Sacrum Medial Deep Tissue Pressure Injury - Purple or maroon localized area of discolored intact skin or blood-filled blister due to damage of underlying soft tissue from pressure and/or shear. (Active)  08/01/23 0000  Location: Sacrum  Location Orientation: Medial  Staging: Deep Tissue Pressure Injury - Purple or maroon localized area of discolored intact skin or blood-filled blister due to damage of underlying soft tissue from pressure and/or shear.  Wound Description (Comments):   Present on Admission:   Dressing Type Foam - Lift dressing to assess site every shift 08/05/23 0900    Consultants:  Neurology, critical care and cardiology  Procedures:  As above  Antimicrobials:  Anti-infectives (From admission, onward)    Start     Dose/Rate Route Frequency Ordered Stop   08/01/23 1600  piperacillin-tazobactam (ZOSYN) IVPB 3.375 g  Status:  Discontinued        3.375 g 12.5 mL/hr over 240 Minutes Intravenous Every 8 hours 08/01/23 0843 08/02/23 1547   08/01/23 0930  piperacillin-tazobactam (ZOSYN) IVPB 3.375 g        3.375 g 100 mL/hr over 30 Minutes Intravenous  Once 08/01/23 0843 08/01/23 1042   07/26/23 0540  valACYclovir (VALTREX) tablet 1,000 mg        1,000 mg Oral Every 12 hours PRN 07/26/23 0541           Subjective: Patient seen No chest pain No shortness of breath.  Objective: Vitals:   08/05/23 0000 08/05/23 0418 08/05/23 0500 08/05/23 1414  BP: 123/71 (!) 142/76  121/71  Pulse: (!) 54 (!) 58  (!) 57  Resp: 16 16  15   Temp: 97.9 F (36.6 C) 97.8 F (36.6 C)   97.6 F (36.4 C)  TempSrc: Oral Oral  Oral  SpO2: 93% 96%  100%  Weight:   55.9 kg   Height:        Intake/Output Summary (Last 24 hours) at 08/05/2023 1547 Last data filed at 08/05/2023 0600 Gross per 24 hour  Intake 360 ml  Output 200 ml  Net 160 ml   Filed Weights   08/03/23 0535 08/04/23 0436 08/05/23 0500  Weight: 50.2 kg 55.8 kg 55.9 kg    Examination:  General exam: Patient is thin.  Parkinsonian features.  Not in any distress.   Respiratory system: Decreased air entry.   Cardiovascular system: S1 & S2 heard. Gastrointestinal system: Abdomen is soft and nontender.   Central nervous system: Awake and alert.  Moves all extremities.  Parkinsonian features.   Extremities: No leg edema.   Data Reviewed: I have personally reviewed following labs and imaging studies  CBC: Recent  Labs  Lab 07/30/23 0418 07/30/23 1225 07/30/23 1226 07/31/23 0714 08/01/23 0638 08/02/23 0149 08/03/23 0628  WBC 9.8  --   --  14.1* 6.7 6.9 7.1  NEUTROABS 7.0  --   --  11.5* 4.8 4.7 4.8  HGB 10.9*   < > 9.9* 12.2 9.8* 9.3* 9.7*  HCT 33.5*   < > 29.0* 37.7 30.9* 28.2* 30.8*  MCV 83.3  --   --  82.0 82.6 82.9 83.0  PLT 263  --   --  307 266 279 287   < > = values in this interval not displayed.   Basic Metabolic Panel: Recent Labs  Lab 07/31/23 0714 08/01/23 8657 08/02/23 0149 08/03/23 0628 08/04/23 0154 08/04/23 0230  NA 130* 132* 132* 133* 130*  --   K 4.6 3.4* 3.3* 3.2* 3.1*  --   CL 94* 100 102 102 101  --   CO2 21* 19* 21* 21* 21*  --   GLUCOSE 73 71 89 105* 88  --   BUN 17 13 11 9  6*  --   CREATININE 1.15* 0.88 1.23* 0.90 1.19*  --   CALCIUM 8.9 8.1* 7.7* 8.2* 8.3*  --   MG 1.7 1.6* 1.9 1.7  --  1.7   GFR: Estimated Creatinine Clearance: 35.5 mL/min (A) (by C-G formula based on SCr of 1.19 mg/dL (H)). Liver Function Tests: Recent Labs  Lab 07/30/23 0418 07/31/23 0714 08/01/23 0638 08/02/23 0149 08/03/23 0628  AST 24 43* 26 31 21   ALT 17 27 22 23 21   ALKPHOS  143* 164* 141* 131* 133*  BILITOT 0.5 0.4 0.5 0.3 0.4  PROT 6.0* 6.1* 4.9* 4.7* 5.1*  ALBUMIN 2.4* 2.5* 1.9* 1.9* 2.1*   No results for input(s): "LIPASE", "AMYLASE" in the last 168 hours. Recent Labs  Lab 07/31/23 1347  AMMONIA 21   Coagulation Profile: No results for input(s): "INR", "PROTIME" in the last 168 hours.  Cardiac Enzymes: No results for input(s): "CKTOTAL", "CKMB", "CKMBINDEX", "TROPONINI" in the last 168 hours. BNP (last 3 results) No results for input(s): "PROBNP" in the last 8760 hours. HbA1C: No results for input(s): "HGBA1C" in the last 72 hours. CBG: Recent Labs  Lab 07/30/23 2327 07/31/23 0627  GLUCAP 97 77   Lipid Profile: No results for input(s): "CHOL", "HDL", "LDLCALC", "TRIG", "CHOLHDL", "LDLDIRECT" in the last 72 hours. Thyroid Function Tests: No results for input(s): "TSH", "T4TOTAL", "FREET4", "T3FREE", "THYROIDAB" in the last 72 hours. Anemia Panel: No results for input(s): "VITAMINB12", "FOLATE", "FERRITIN", "TIBC", "IRON", "RETICCTPCT" in the last 72 hours. Sepsis Labs: Recent Labs  Lab 07/31/23 0902 08/01/23 8469 08/02/23 0149 08/03/23 0628  PROCALCITON 0.16 0.24 0.18 <0.10    Recent Results (from the past 240 hour(s))  Respiratory (~20 pathogens) panel by PCR     Status: None   Collection Time: 07/27/23 12:28 PM   Specimen: Nasopharyngeal Swab; Respiratory  Result Value Ref Range Status   Adenovirus NOT DETECTED NOT DETECTED Final   Coronavirus 229E NOT DETECTED NOT DETECTED Final    Comment: (NOTE) The Coronavirus on the Respiratory Panel, DOES NOT test for the novel  Coronavirus (2019 nCoV)    Coronavirus HKU1 NOT DETECTED NOT DETECTED Final   Coronavirus NL63 NOT DETECTED NOT DETECTED Final   Coronavirus OC43 NOT DETECTED NOT DETECTED Final   Metapneumovirus NOT DETECTED NOT DETECTED Final   Rhinovirus / Enterovirus NOT DETECTED NOT DETECTED Final   Influenza A NOT DETECTED NOT DETECTED Final   Influenza B NOT DETECTED  NOT DETECTED Final   Parainfluenza Virus 1 NOT DETECTED NOT DETECTED Final   Parainfluenza Virus 2 NOT DETECTED NOT DETECTED Final   Parainfluenza Virus 3 NOT DETECTED NOT DETECTED Final   Parainfluenza Virus 4 NOT DETECTED NOT DETECTED Final   Respiratory Syncytial Virus NOT DETECTED NOT DETECTED Final   Bordetella pertussis NOT DETECTED NOT DETECTED Final   Bordetella Parapertussis NOT DETECTED NOT DETECTED Final   Chlamydophila pneumoniae NOT DETECTED NOT DETECTED Final   Mycoplasma pneumoniae NOT DETECTED NOT DETECTED Final    Comment: Performed at [facility], [address] [place], [place] [address]  Body fluid culture w Gram Stain     Status: None   Collection Time: 08/01/23  7:25 AM   Specimen: Pleura; Body Fluid  Result Value Ref Range Status   Specimen Description PLEURAL RIGHT  Final   Special Requests NONE  Final   Gram Stain NO WBC SEEN NO ORGANISMS SEEN   Final   Culture   Final    NO GROWTH 3 DAYS Performed at [facility], [address], [place], [place] [address]    Report Status 08/04/2023 FINAL  Final     Radiology Studies: No results found.  Scheduled Meds:  amiodarone  200 mg Oral Daily   apixaban  5 mg Oral BID   colchicine  0.6 mg Oral Daily   guaiFENesin  600 mg Oral BID   irbesartan  150 mg Oral Daily   lamoTRIgine  150 mg Oral Daily   methIMAzole  10 mg Oral Daily   metoprolol tartrate  25 mg Oral BID   montelukast  10 mg Oral QHS   mouth rinse  15 mL Mouth Rinse 4 times per day   Oxcarbazepine  300 mg Oral BID   pantoprazole  40 mg Oral Daily   potassium chloride  40 mEq Oral Q4H   pravastatin  40 mg Oral QAC breakfast   pyridostigmine  60 mg Oral Q8H   sertraline  50 mg Oral Daily   sodium chloride flush  3 mL Intravenous Q12H   sodium chloride flush  3 mL Intravenous Q12H   sodium chloride HYPERTONIC  4 mL Nebulization BID   spironolactone  12.5 mg Oral Daily   Continuous Infusions:  sodium chloride     magnesium  sulfate bolus IVPB       LOS: 11 days   Barnetta Chapel, MD [facility]  08/05/2023, 3:47 PM   *Please note that this is a verbal dictation therefore any spelling or grammatical errors are due to the "Dragon Medical One" system interpretation.  Please [place] via Amion and do not message via secure chat for urgent patient care matters. Secure chat can be used for non urgent patient care matters.  How to contact the [facility] Attending or Consulting provider 7A - 7P or covering provider during after hours 7P -7A, for this patient?  Check the care team in [facility] and look for a) attending/consulting TR[facility] provider listed and b) the [facility] team listed. [place] or secure chat 7A-7P. Log into [email or web address] and use [place]'s universal password to access. If you do not have the password, please contact the hospital operator. Locate the [facility] provider you are looking for under [facility] and [place] to a number that you can be directly reached. If you still have difficulty reaching the provider, please [place] the [facility] (Director on Call) for the Hospitalists listed on amion for assistance.

## 2023-08-05 NOTE — Plan of Care (Signed)
  Problem: Education: Goal: Knowledge of General Education information will improve Description: Including pain rating scale, medication(s)/side effects and non-pharmacologic comfort measures 08/05/2023 0811 by Herma Carson, RN Outcome: Progressing 08/05/2023 0811 by Herma Carson, RN Outcome: Progressing   Problem: Health Behavior/Discharge Planning: Goal: Ability to manage health-related needs will improve 08/05/2023 0811 by Herma Carson, RN Outcome: Progressing 08/05/2023 0811 by Herma Carson, RN Outcome: Progressing   Problem: Clinical Measurements: Goal: Ability to maintain clinical measurements within normal limits will improve 08/05/2023 0811 by Herma Carson, RN Outcome: Progressing 08/05/2023 0811 by Herma Carson, RN Outcome: Progressing Goal: Will remain free from infection Outcome: Progressing Goal: Diagnostic test results will improve Outcome: Progressing Goal: Respiratory complications will improve Outcome: Progressing Goal: Cardiovascular complication will be avoided Outcome: Progressing   Problem: Activity: Goal: Risk for activity intolerance will decrease Outcome: Progressing   Problem: Nutrition: Goal: Adequate nutrition will be maintained Outcome: Progressing   Problem: Coping: Goal: Level of anxiety will decrease Outcome: Progressing   Problem: Elimination: Goal: Will not experience complications related to bowel motility Outcome: Progressing Goal: Will not experience complications related to urinary retention Outcome: Progressing   Problem: Pain Managment: Goal: General experience of comfort will improve Outcome: Progressing   Problem: Safety: Goal: Ability to remain free from injury will improve Outcome: Progressing   Problem: Skin Integrity: Goal: Risk for impaired skin integrity will decrease Outcome: Progressing   Problem: Activity: Goal: Ability to tolerate increased activity will improve Outcome: Progressing    Problem: Clinical Measurements: Goal: Ability to maintain a body temperature in the normal range will improve Outcome: Progressing   Problem: Respiratory: Goal: Ability to maintain adequate ventilation will improve Outcome: Progressing Goal: Ability to maintain a clear airway will improve Outcome: Progressing   Problem: Education: Goal: Understanding of CV disease, CV risk reduction, and recovery process will improve Outcome: Progressing Goal: Individualized Educational Video(s) Outcome: Progressing   Problem: Activity: Goal: Ability to return to baseline activity level will improve Outcome: Progressing   Problem: Cardiovascular: Goal: Ability to achieve and maintain adequate cardiovascular perfusion will improve Outcome: Progressing Goal: Vascular access site(s) Level 0-1 will be maintained Outcome: Progressing   Problem: Health Behavior/Discharge Planning: Goal: Ability to safely manage health-related needs after discharge will improve Outcome: Progressing

## 2023-08-06 ENCOUNTER — Inpatient Hospital Stay (HOSPITAL_COMMUNITY): Payer: Medicare PPO

## 2023-08-06 DIAGNOSIS — R569 Unspecified convulsions: Secondary | ICD-10-CM | POA: Diagnosis not present

## 2023-08-06 DIAGNOSIS — R4182 Altered mental status, unspecified: Secondary | ICD-10-CM | POA: Diagnosis not present

## 2023-08-06 DIAGNOSIS — J90 Pleural effusion, not elsewhere classified: Secondary | ICD-10-CM | POA: Diagnosis not present

## 2023-08-06 LAB — BASIC METABOLIC PANEL
Anion gap: 11 (ref 5–15)
BUN: 7 mg/dL — ABNORMAL LOW (ref 8–23)
CO2: 18 mmol/L — ABNORMAL LOW (ref 22–32)
Calcium: 8.3 mg/dL — ABNORMAL LOW (ref 8.9–10.3)
Chloride: 99 mmol/L (ref 98–111)
Creatinine, Ser: 0.99 mg/dL (ref 0.44–1.00)
GFR, Estimated: 59 mL/min — ABNORMAL LOW (ref >60)
Glucose, Bld: 96 mg/dL (ref 70–99)
Potassium: 5.1 mmol/L (ref 3.5–5.1)
Sodium: 128 mmol/L — ABNORMAL LOW (ref 135–145)

## 2023-08-06 LAB — GLUCOSE, CAPILLARY
Glucose-Capillary: 103 mg/dL — ABNORMAL HIGH (ref 70–99)
Glucose-Capillary: 105 mg/dL — ABNORMAL HIGH (ref 70–99)

## 2023-08-06 MED ORDER — SODIUM CHLORIDE 0.9 % IV SOLN: INTRAVENOUS | Status: DC

## 2023-08-06 NOTE — Progress Notes (Signed)
Pt began to have increased change in mental status and condition as oppose to when I met her this AM. Pt now having delayed responses, more difficulty walking, began to to foam at mouth with clear mucus suction by beside initiated VS are 189/95 p-56 r-15 temp 97.7 CBG 103 called Rapid MD informed charge nurse informed

## 2023-08-06 NOTE — Progress Notes (Signed)
Physical Therapy Treatment Patient Details Name: Kristen Hansen MRN: [id number] DOB: [birth year] Today's Date: 08/06/2023   History of Present Illness Pt is 77 yo female presenting to [facility] ED with chest pain. Chest pain has been moving and is increased with palpation.  Pt underwent on 8/1 thoracentesis 1.3 L   and on 8/4 chest tube placement.   PMH: HTN, hyperlipidemia, Lewy body dementia, Sjogren's syndrome, rheumatoid arthritis, chronic pain, DVT, Spine disease, spinal stenosis, diastolic CHF, depression, REM behavior disorder, orthostatic Hypotension, CKD 3, GERD, hypothrydoidism.    PT Comments  Pt admitted with above diagnosis. Pt was able to ambulate but needed max assist of 2 with heavy posterior and right lean today.  Notified nursing regarding pts status as she was staring and didn't seem herself.  She also had elevated BP and c/o nausea and dizziness.  Pt did not do as well today.   Pt currently with functional limitations due to the deficits listed below (see PT Problem List). Pt will benefit from acute skilled PT to increase their independence and safety with mobility to allow discharge.       If plan is discharge home, recommend the following: A little help with walking and/or transfers;Assistance with cooking/housework;Assist for transportation;Help with stairs or ramp for entrance   Can travel by private vehicle        Equipment Recommendations  None recommended by PT    Recommendations for Other Services       Precautions / Restrictions Precautions Precautions: Fall Precaution Comments: watch BP Restrictions Weight Bearing Restrictions: No     Mobility  Bed Mobility Overal bed mobility: Needs Assistance Bed Mobility: Rolling Rolling: Max assist   Supine to sit: Used rails, Max assist     General bed mobility comments: use of rail and needed some assist for elevation of trunk    Transfers Overall transfer level: Needs assistance Equipment used: Rolling walker (2  wheels) Transfers: Sit to/from Stand, Bed to chair/wheelchair/BSC Sit to Stand: +2 physical assistance, From elevated surface, Max assist           General transfer comment: Pt needed max assist to power up to  RW with cues for hand placement.  Pt leaning posteriorly on heels needing  verbala nd tactile cues to lean forward with much heavier lean today. Pt needed up to max assist. Needed physical  assist and verbal cues to lean forward and stand tall with cues for sequencing.    Ambulation/Gait Ambulation/Gait assistance: +2 safety/equipment, Max assist Gait Distance (Feet): 30 Feet Assistive device: Rolling walker (2 wheels) Gait Pattern/deviations: Trunk flexed, Shuffle, Step-through pattern, Decreased stride length, Decreased step length - right, Decreased step length - left, Drifts right/left, Narrow base of support, Leaning posteriorly, Antalgic   Gait velocity interpretation: <1.31 ft/sec, indicative of household ambulator   General Gait Details: Pt needing much greater assist to progress ambulation today with chair follow. Pt needing assist for stability as well as assist to sequence steps and RW. Pt unsteady at times and by end of walk heavy lean to right needing steadying assist.  Pt also with decr responsiveness and reports nausea and dizziness.  BP elevated and let nurseknow.  150/80 initially then 190/172 and at end of session 166/71.   Stairs             Wheelchair Mobility     Tilt Bed    Modified Rankin (Stroke Patients Only)       Balance Overall balance assessment: Needs assistance Sitting-balance  support: Single extremity supported, No upper extremity supported, Feet supported Sitting balance-Leahy Scale: Fair Sitting balance - Comments: supervision for sitting balance   Standing balance support: Bilateral upper extremity supported Standing balance-Leahy Scale: Poor Standing balance comment: relies on RW with heavy posterior lean onto heels                             Cognition Arousal: Alert Behavior During Therapy: Flat affect Overall Cognitive Status: History of cognitive impairments - at baseline                                 General Comments: Pt did not seem herself today with blank stare much of the session.  She did agree to walk however was nauseated and still feeling poorly at end of session. nurse aware.        Exercises General Exercises - Lower Extremity Long Arc Quad: AAROM, 10 reps Hip Flexion/Marching: AAROM, 10 reps    General Comments        Pertinent Vitals/Pain Pain Assessment Pain Assessment: No/denies pain    Home Living                          Prior Function            PT Goals (current goals can now be found in the care plan section) Acute Rehab PT Goals Patient Stated Goal: none reported Progress towards PT goals: Progressing toward goals    Frequency    Min 1X/week      PT Plan Current plan remains appropriate    Co-evaluation              AM-PAC PT "6 Clicks" Mobility   Outcome Measure  Help needed turning from your back to your side while in a flat bed without using bedrails?: A Lot Help needed moving from lying on your back to sitting on the side of a flat bed without using bedrails?: A Lot Help needed moving to and from a bed to a chair (including a wheelchair)?: Total Help needed standing up from a chair using your arms (e.g., wheelchair or bedside chair)?: Total Help needed to walk in hospital room?: Total Help needed climbing 3-5 steps with a railing? : Total 6 Click Score: 8    End of Session Equipment Utilized During Treatment: Gait belt Activity Tolerance: Patient limited by fatigue Patient left: with call bell/phone within reach;with family/visitor present;in chair;with chair alarm set Nurse Communication: Mobility status;Need for lift equipment (use Stedy) PT Visit Diagnosis: Unsteadiness on feet (R26.81);Other  abnormalities of gait and mobility (R26.89)     Time: 6578-4696 PT Time Calculation (min) (ACUTE ONLY): 20 min  Charges:    $Gait Training: 8-22 mins PT General Charges $$ ACUTE PT VISIT: 1 Visit                      M,PT Acute Rehab Services [phone]    Bevelyn Buckles 08/06/2023, 1:46 PM

## 2023-08-06 NOTE — Progress Notes (Signed)
PROGRESS NOTE    Anet Kennel Ege  YQM:[id number] DOB: June 07, 1946 DOA: 07/25/2023 PCP: Kerin Salen, PA-C   Brief Narrative:  Kristen Hansen is a 77 y.o. female with medical history significant of hypertension, hyperlipidemia, Lewy body dementia, Sjogren's syndrome, rheumatoid arthritis, chronic pain, DVT, spinal stenosis, diastolic CHF, depression, REM behavior disorder, orthostatic hypotension, CKD 3, GERD, hyperthyroidism presenting with chest pain. Her chest pain has atypical nature and present for 2 weeks intermittently. She was found to have a moderate pleural effusion. Cardiology consulted, s/p cardiac cath with clean coronaries.    08/03/2023: Patient seen alongside patient's daughter.  Also discussed with patient's husband over the phone.  No new complaints.  Patient continues to improve.  Patient has done on colchicine.  Plan is to discharge patient to [facility] for short-term rehab.  08/04/2023: Patient seen alongside patient's husband.  No new complaints.  Patient is awaiting disposition.  Continue to optimize blood pressure.  08/05/2023: Patient seen alongside patient's husband.  No new complaints.  Low sodium and low potassium noted.  Dry buccal mucosa is noted.  Gentle hydration (normal saline plus KCl 20 mEq at 50 cc/h).  Monitor renal function and electrolytes.  Pursue disposition.  08/06/2023: Patient was reported to be leaning towards 1 side.  MRI brain, CT head and EEG done today were nonrevealing.  Elevated blood pressure noted.  Continue to monitor closely.  Assessment & Plan:   Principal Problem:   Pleural effusion Active Problems:   Rheumatoid arthritis (HCC)   HTN (hypertension)   Chronic bilateral low back pain without sciatica   Depression   Lewy body dementia without behavioral disturbance, psychotic disturbance, mood disturbance, or anxiety (HCC)   Spinal stenosis of lumbar region   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)   Chronic heart  failure with preserved ejection fraction (HCC)   CKD (chronic kidney disease) stage 3, GFR 30-59 ml/min (HCC)   Gastroesophageal reflux disease with esophagitis   Mixed hyperlipidemia   Chest pain   Sjogren's syndrome (HCC)   Acute idiopathic pericarditis   PAF (paroxysmal atrial fibrillation) (HCC)   Need for management of chest tube   Chest Pain, exudative and recurrent right-sided pleural Effusion, Pericardial Effusion - Chest pain improved after thoracentesis. Given re-accumulation of fluid and exudative nature, PCCM was consulted. Appreciate their assistance. They placed a chest tube and are managing that.  Still with chest tube to suction.  On discharge, will have her follow-up with pulmonary outpatient along with rheumatology.    Acute on chronic diastolic CHF EF 50 to 55%, and some preceding history of exertional shortness of breath.   Pt had episode of worsening dyspnea on morning of 08/04 with coughing, CXR obtained showed volume overload. She was given IV lasix with improvement in her symptoms.  Cardiology on board.  Underwent cath which showed clean coronaries.  Has been started on Lasix 20 mg p.o. daily on 08/01/2023. 08/03/2023: Stable. 08/06/2023: Cautious hydration.  Monitor cardiac symptoms closely.   Pericardial Effusion She has mild pericarditis and is currently on colchicine for pericardial effusion along with aspirin per cardiology. 08/03/2023: Treat pericarditis for 3 months with colchicine.   Paroxysmal Atrial Fibrillation: Cardiology following, rates controlled, on amiodarone and metoprolol oral as well as heparin drip, will switch to DOAC once chest tube is out.  Dysphagia: OP work up ongoing. EGD without any acute findings. Getting swallowing therapy. SLP following pt. Repeat MBBS shows pt has some retention of contrast and some aspiration concern  but overall unchanged from prior study and likely secondary to progressing lewy body dementia. Placed on Dys 3 Diet here. Will  need follow up with palliative care OP (see below).    Lewy Body Dementia: On pyridostigmine. Last saw neurologist one month ago. Appears to be progressing and pt may benefit from palliative care consult (see below).   Acute encephalopathy/delirium: Resolved as currently she is fully alert and oriented to person place and year but not month.  Husband at the bedside verifies that this is her baseline.   GOC: Pt with lewey body dementia and multiple co-morbidities and worsening dysphagia. She may benefit from OP palliative care consult.    Orthostatic Hypotension: Pt with orthostatic hypotension. Suspect this is secondary to her lewy body dementia.  Continue TED hose.  Would encourage her to change positions slowly. Per husband, she has some dizziness at home.    Hyponatremia:  -Sodium is 128 today.Marland Kitchen   Hypomagnesemia/Hypokalemia:  Mag is 2.1 today and potassium of 5.1.     Hx of DVT: D dimer elevated,  CTA negative, lower extremity doppler which was negative for DVT.  Now on heparin drip for A-fib.     Hyperthyroidism: Recently started on methimazole. States she is complaint with this. Will continue here. FT4 normal and FT3 slightly decreased at 1.7. Thyroid ultrasound ordered and shows bilateral nodules that do not meet criteria for biopsy for dedicated follow up.  Will need OP endocrinology follow up.    Left Shoulder Pain: Has good PROM but pain with active ROM. Concern for RT injury. MRI was obtained that showed moderate to severe glenohumeral OA and mild RT tendinosis and mild degenerative changes of the [facility] joint and mild subacromial bursitis. Conservative measures for now.  Overall better, outpatient orthopedics follow-up postdischarge. PCP to arrange.   HTN:  Home meds: amlodipine 5 mg every day, irbesartan 300 mg every day, lopressor 25 mg BID.  Patient currently on Aldactone, irbesartan, metoprolol.  Blood pressure fairly controlled.   GERD: Continue home med protonix. Does not  endorse symptoms of reflux.    Sjogren's Syndrome: Not on any pharmacotherapy.  Likely causing pericardial effusion and exudative right-sided pleural effusion, currently on colchicine for pericardial effusion may require outpatient rheumatology follow-up postdischarge.   RA: Was on enbrel. Last used 2 months ago.    Chronic back pain: Tylenol prn   MDD: Zoloft 50 mg every day. Continued here.    Trigeminal Neuralgia: On lamictal and trileptal. Both continued here.  Code stroke/ ? TIA: Patient was noted to have right-sided weakness by RN at around 6:30 AM. LKN was at about 9:00 PM last night when RN administered her meds. The patient was drowsy and poorly cooperative at that time, which were attributed to a recent Klonopin dose .  Code stroke was called, seen by neurology.  Underwent CT head, CTA head and neck and MRI brain and was ruled out of stroke.  EEG negative as well.  Patient back at her baseline mental status.  Neurology signed off. 08/06/2023: Patient noted to be leaning towards 1 side.  MRI brain, CT head and EEG all came back negative.    DVT prophylaxis: Place TED hose Start: 07/27/23 1141   Code Status: Full Code  Family Communication: Husband present at bedside.   Status is: Inpatient Remains inpatient appropriate because: Patient with chest tube   Estimated body mass index is 18.32 kg/m as calculated from the following:   Height as of this encounter: 5\' 10"  (1.778  m).   Weight as of this encounter: 57.9 kg.  Pressure Injury 08/01/23 Sacrum Medial Deep Tissue Pressure Injury - Purple or maroon localized area of discolored intact skin or blood-filled blister due to damage of underlying soft tissue from pressure and/or shear. (Active)  08/01/23 0000  Location: Sacrum  Location Orientation: Medial  Staging: Deep Tissue Pressure Injury - Purple or maroon localized area of discolored intact skin or blood-filled blister due to damage of underlying soft tissue from pressure  and/or shear.  Wound Description (Comments):   Present on Admission:   Dressing Type Foam - Lift dressing to assess site every shift 08/05/23 2007   Nutritional Assessment: Body mass index is 18.32 kg/m.Marland Kitchen Seen by dietician.  I agree with the assessment and plan as outlined below: Nutrition Status:        . Skin Assessment: I have examined the patient's skin and I agree with the wound assessment as performed by the wound care RN as outlined below: Pressure Injury 08/01/23 Sacrum Medial Deep Tissue Pressure Injury - Purple or maroon localized area of discolored intact skin or blood-filled blister due to damage of underlying soft tissue from pressure and/or shear. (Active)  08/01/23 0000  Location: Sacrum  Location Orientation: Medial  Staging: Deep Tissue Pressure Injury - Purple or maroon localized area of discolored intact skin or blood-filled blister due to damage of underlying soft tissue from pressure and/or shear.  Wound Description (Comments):   Present on Admission:   Dressing Type Foam - Lift dressing to assess site every shift 08/05/23 2007    Consultants:  Neurology, critical care and cardiology  Procedures:  As above  Antimicrobials:  Anti-infectives (From admission, onward)    Start     Dose/Rate Route Frequency Ordered Stop   08/01/23 1600  piperacillin-tazobactam (ZOSYN) IVPB 3.375 g  Status:  Discontinued        3.375 g 12.5 mL/hr over 240 Minutes Intravenous Every 8 hours 08/01/23 0843 08/02/23 1547   08/01/23 0930  piperacillin-tazobactam (ZOSYN) IVPB 3.375 g        3.375 g 100 mL/hr over 30 Minutes Intravenous  Once 08/01/23 0843 08/01/23 1042   07/26/23 0540  valACYclovir (VALTREX) tablet 1,000 mg        1,000 mg Oral Every 12 hours PRN 07/26/23 0541           Subjective: Patient seen No chest pain No shortness of breath.  Objective: Vitals:   08/05/23 2253 08/06/23 0318 08/06/23 1300 08/06/23 1322  BP: (!) 158/64 135/66 (!) 189/95   Pulse:  60   (!) 58  Resp:  14 15 15   Temp:  (!) 96.8 F (36 C) 97.7 F (36.5 C) 97.7 F (36.5 C)  TempSrc:  Oral    SpO2:      Weight:  57.9 kg    Height:        Intake/Output Summary (Last 24 hours) at 08/06/2023 1752 Last data filed at 08/06/2023 1249 Gross per 24 hour  Intake 1039.87 ml  Output 350 ml  Net 689.87 ml   Filed Weights   08/04/23 0436 08/05/23 0500 08/06/23 0318  Weight: 55.8 kg 55.9 kg 57.9 kg    Examination:  General exam: Patient is thin.  Parkinsonian features.  Not in any distress.   Respiratory system: Decreased air entry.   Cardiovascular system: S1 & S2 heard. Gastrointestinal system: Abdomen is soft and nontender.   Central nervous system: Awake and alert.  Moves all extremities.  Parkinsonian features.  Extremities: No leg edema.   Data Reviewed: I have personally reviewed following labs and imaging studies  CBC: Recent Labs  Lab 07/31/23 0714 08/01/23 0638 08/02/23 0149 08/03/23 0628  WBC 14.1* 6.7 6.9 7.1  NEUTROABS 11.5* 4.8 4.7 4.8  HGB 12.2 9.8* 9.3* 9.7*  HCT 37.7 30.9* 28.2* 30.8*  MCV 82.0 82.6 82.9 83.0  PLT 307 266 279 287   Basic Metabolic Panel: Recent Labs  Lab 08/01/23 0638 08/02/23 0149 08/03/23 0628 08/04/23 0154 08/04/23 0230 Aug 14, 2023 0128 August 14, 2023 1219  NA 132* 132* 133* 130*  --  130* 128*  K 3.4* 3.3* 3.2* 3.1*  --  5.3* 5.1  CL 100 102 102 101  --  101 99  CO2 19* 21* 21* 21*  --  20* 18*  GLUCOSE 71 89 105* 88  --  85 96  BUN 13 11 9  6*  --  6* 7*  CREATININE 0.88 1.23* 0.90 1.19*  --  0.93 0.99  CALCIUM 8.1* 7.7* 8.2* 8.3*  --  8.4* 8.3*  MG 1.6* 1.9 1.7  --  1.7 2.1  --   PHOS  --   --   --   --   --  2.7  --    GFR: Estimated Creatinine Clearance: 44.2 mL/min (by C-G formula based on SCr of 0.99 mg/dL). Liver Function Tests: Recent Labs  Lab 07/31/23 0714 08/01/23 0865 08/02/23 0149 08/03/23 0628 Aug 14, 2023 0128  AST 43* 26 31 21   --   ALT 27 22 23 21   --   ALKPHOS 164* 141* 131* 133*  --    BILITOT 0.4 0.5 0.3 0.4  --   PROT 6.1* 4.9* 4.7* 5.1*  --   ALBUMIN 2.5* 1.9* 1.9* 2.1* 2.2*   No results for input(s): "LIPASE", "AMYLASE" in the last 168 hours. Recent Labs  Lab 07/31/23 1347  AMMONIA 21   Coagulation Profile: No results for input(s): "INR", "PROTIME" in the last 168 hours.  Cardiac Enzymes: No results for input(s): "CKTOTAL", "CKMB", "CKMBINDEX", "TROPONINI" in the last 168 hours. BNP (last 3 results) No results for input(s): "PROBNP" in the last 8760 hours. HbA1C: No results for input(s): "HGBA1C" in the last 72 hours. CBG: Recent Labs  Lab 07/30/23 2327 07/31/23 0627 08/14/23 1316  GLUCAP 97 77 103*   Lipid Profile: No results for input(s): "CHOL", "HDL", "LDLCALC", "TRIG", "CHOLHDL", "LDLDIRECT" in the last 72 hours. Thyroid Function Tests: No results for input(s): "TSH", "T4TOTAL", "FREET4", "T3FREE", "THYROIDAB" in the last 72 hours. Anemia Panel: No results for input(s): "VITAMINB12", "FOLATE", "FERRITIN", "TIBC", "IRON", "RETICCTPCT" in the last 72 hours. Sepsis Labs: Recent Labs  Lab 07/31/23 0902 08/01/23 7846 08/02/23 0149 08/03/23 0628  PROCALCITON 0.16 0.24 0.18 <0.10    Recent Results (from the past 240 hour(s))  Body fluid culture w Gram Stain     Status: None   Collection Time: 08/01/23  7:25 AM   Specimen: Pleura; Body Fluid  Result Value Ref Range Status   Specimen Description PLEURAL RIGHT  Final   Special Requests NONE  Final   Gram Stain NO WBC SEEN NO ORGANISMS SEEN   Final   Culture   Final    NO GROWTH 3 DAYS Performed at [facility], [address] [place], [place] [address]    Report Status 08/04/2023 FINAL  Final     Radiology Studies: EEG adult  Result Date: Aug 14, 2023 Charlsie Quest, MD     08/14/2023  4:27 PM Patient Name: Kristen Hansen  Chain MRN: [id number] Epilepsy Attending: Charlsie Quest Referring Physician/Provider: Barnetta Chapel, MD Date: 08/06/2023 Duration: 22.22 mins Patient history:  77yo F with ams getting eeg to evaluate for seizure Level of alertness: Awake AEDs during EEG study: OXC Technical aspects: This EEG study was done with scalp electrodes positioned according to the 10-20 International system of electrode placement. Electrical activity was reviewed with band pass filter of 1-70Hz , sensitivity of 7 uV/mm, display speed of 107mm/sec with a 60Hz  notched filter applied as appropriate. EEG data were recorded continuously and digitally stored.  Video monitoring was available and reviewed as appropriate. Description: The posterior dominant rhythm consists of 7 Hz activity of moderate voltage (25-35 uV) seen predominantly in posterior head regions, symmetric and reactive to eye opening and eye closing. EEG showed continuous generalized predominantly 5 to 7 Hz theta slowing admixed with intermittent generalized 2-3hz  delta slowing. Hyperventilation and photic stimulation were not performed.   ABNORMALITY - Continuous slow, generalized IMPRESSION: This study is suggestive of moderate diffuse encephalopathy, nonspecific etiology. No seizures or epileptiform discharges were seen throughout the recording. Charlsie Quest   MR BRAIN WO CONTRAST  Result Date: 08/06/2023 CLINICAL DATA:  Transient ischemic attack. EXAM: MRI HEAD WITHOUT CONTRAST TECHNIQUE: Multiplanar, multiecho pulse sequences of the brain and surrounding structures were obtained without intravenous contrast. COMPARISON:  Brain MRI 07/31/2023. FINDINGS: Brain: No acute infarct or hemorrhage. 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 No mass or midline shift. No hydrocephalus or extra-axial collection. Few foci of chronic hemosiderin deposition in the deep cerebral white matter. Vascular: Normal flow voids. Skull and upper cervical spine: Normal marrow signal. Sinuses/Orbits: No acute findings. Other: None. IMPRESSION: 1. No acute intracranial process. 2. Unchanged  mild chronic small-vessel disease and old lacunar infarct in the right cerebellar hemisphere. Electronically Signed   By: Orvan Falconer M.D.   On: 08/06/2023 16:04   CT HEAD WO CONTRAST ( )  Result Date: 08/06/2023 CLINICAL DATA:  Transient ischemic attack. EXAM: CT HEAD WITHOUT CONTRAST TECHNIQUE: Contiguous axial images were obtained from the base of the skull through the vertex without intravenous contrast. RADIATION DOSE REDUCTION: This exam was performed according to the departmental dose-optimization program which includes automated exposure control, adjustment of the mA and/or kV according to patient size and/or use of iterative reconstruction technique. COMPARISON:  Brain MRI 624.  Head CT 07/31/2023. FINDINGS: Brain: No acute hemorrhage. Unchanged mild chronic small-vessel disease with old lacunar infarct in the right cerebellar hemisphere. Cortical gray-white differentiation is otherwise preserved. Prominence of the ventricles and sulci within expected range for age. No hydrocephalus or extra-axial collection. No mass effect or midline shift. Vascular: No hyperdense vessel or unexpected calcification. Skull: No calvarial fracture or suspicious bone lesion. Skull base is unremarkable. Sinuses/Orbits: No acute finding. Other: None. IMPRESSION: 1. No acute intracranial abnormality. 2. Unchanged mild chronic small-vessel disease with old lacunar infarct in the right cerebellar hemisphere. Electronically Signed   By: Orvan Falconer M.D.   On: 08/06/2023 15:58    Scheduled Meds:  amiodarone  200 mg Oral Daily   apixaban  5 mg Oral BID   colchicine  0.6 mg Oral Daily   guaiFENesin  600 mg Oral BID   irbesartan  150 mg Oral Daily   lamoTRIgine  150 mg Oral Daily   methIMAzole  10 mg Oral Daily   metoprolol tartrate  25 mg Oral BID   montelukast  10 mg Oral QHS   mouth rinse  15 mL Mouth Rinse 4 times per day   Oxcarbazepine  300 mg Oral BID   pantoprazole  40 mg Oral Daily   pravastatin  40  mg Oral QAC breakfast   pyridostigmine  60 mg Oral Q8H   sertraline  50 mg Oral Daily   sodium chloride flush  3 mL Intravenous Q12H   sodium chloride flush  3 mL Intravenous Q12H   spironolactone  12.5 mg Oral Daily   Continuous Infusions:  sodium chloride     sodium chloride 50 mL/hr at 08/06/23 1249     LOS: 12 days   Barnetta Chapel, MD [facility]  08/06/2023, 5:52 PM   *Please note that this is a verbal dictation therefore any spelling or grammatical errors are due to the "Dragon Medical One" system interpretation.  Please Filip via Amion and do not message via secure chat for urgent patient care matters. Secure chat can be used for non urgent patient care matters.  How to contact the [employer] Attending or Consulting provider 7A - 7P or covering provider during after hours 7P -7A, for this patient?  Check the care team in [facility] and look for a) attending/consulting TRH provider listed and b) the [facility] team listed. [place] or secure chat 7A-7P. Log into [email or web address] and use [place]'s universal password to access. If you do not have the password, please contact the hospital operator. Locate the [facility] provider you are looking for under [facility] and [place] to a number that you can be directly reached. If you still have difficulty reaching the provider, please [place] the [facility] (Director on Call) for the Hospitalists listed on amion for assistance.

## 2023-08-06 NOTE — Progress Notes (Signed)
EEG complete - results pending 

## 2023-08-06 NOTE — TOC Progression Note (Addendum)
Transition of Care [facility]) - Progression Note    Patient Details  Name: Kristen Hansen MRN: [id number] Date of Birth: 1945/12/29  [facility] CM/SW Contact  Delilah Shan, LCSWA Phone Number: 08/06/2023, 10:28 AM  Clinical Narrative:     CSW spoke with patients spouse who accepted SNF bed offer with [facility]. CSW awaiting callback from Brittney with [facility] to confirm SNF bed for patient. CSW started insurance authorization for patient. Auth ID# [id number].  CSW provided SNF bed offers. Patients spouse is going to review and give CSW a call back with SNF choice. CSW will continue to follow.   Expected Discharge Plan: Skilled Nursing Facility Barriers to Discharge: Continued Medical Work up  Expected Discharge Plan and Services In-house Referral: Clinical Social Work Discharge Planning Services: CM Consult   Living arrangements for the past 2 months: Single Family Home                   DME Agency: NA                   Social Determinants of Health (SDOH) Interventions SDOH Screenings   Food Insecurity: No Food Insecurity (07/25/2023)  Housing: Low Risk  (07/25/2023)  Transportation Needs: No Transportation Needs (07/25/2023)  Utilities: Not At Risk (07/25/2023)  Financial Resource Strain: Low Risk  (08/22/2022)   Received from [facility], [facility] prior to 02/24/2023., [facility] visits prior to 02/24/2023.  Physical Activity: Inactive (08/22/2022)   [facility], [facility] prior to 02/24/2023., [facility] visits prior to 02/24/2023.  Social Connections: Socially Integrated (08/22/2022)   [facility], [facility] prior to 02/24/2023., [facility] visits prior to 02/24/2023.  Stress: No Stress Concern Present (08/22/2022)   [facility]  [facility] prior to 02/24/2023., [facility] visits prior to 02/24/2023.  Tobacco Use: Low Risk  (07/25/2023)    Readmission Risk Interventions     No data to display

## 2023-08-06 NOTE — Procedures (Signed)
Patient Name: Kristen Hansen  MRN: [id number]  Epilepsy Attending: Charlsie Quest  Referring Physician/Provider: Barnetta Chapel, MD  Date: 08/06/2023 Duration: 22.22 mins  Patient history: 77yo F with ams getting eeg to evaluate for seizure  Level of alertness: Awake  AEDs during EEG study: OXC  Technical aspects: This EEG study was done with scalp electrodes positioned according to the 10-20 International system of electrode placement. Electrical activity was reviewed with band pass filter of 1-70Hz , sensitivity of 7 uV/mm, display speed of 75mm/sec with a 60Hz  notched filter applied as appropriate. EEG data were recorded continuously and digitally stored.  Video monitoring was available and reviewed as appropriate.  Description: The posterior dominant rhythm consists of 7 Hz activity of moderate voltage (25-35 uV) seen predominantly in posterior head regions, symmetric and reactive to eye opening and eye closing. EEG showed continuous generalized predominantly 5 to 7 Hz theta slowing admixed with intermittent generalized 2-3hz  delta slowing. Hyperventilation and photic stimulation were not performed.     ABNORMALITY - Continuous slow, generalized  IMPRESSION: This study is suggestive of moderate diffuse encephalopathy, nonspecific etiology. No seizures or epileptiform discharges were seen throughout the recording.   Annabelle Harman

## 2023-08-06 NOTE — Care Management Important Message (Signed)
Important Message  Patient Details  Name: Kristen Hansen MRN: [id number] Date of Birth: 24-Nov-1946   Medicare Important Message Given:  Yes     Renie Ora 08/06/2023, 11:39 AM

## 2023-08-06 NOTE — Progress Notes (Signed)
Stroke RN asked to come check this patient with change in mental status. Pt is Kristen Hansen, 77 yr old admitted to [facility] for pleural effusion. She has a PMH of CHF, AF on Eliquis, Lewy body dementia, hyponatremia. Per RN and husband, pt's speech was clearer this morning. (LKW varies, but husband certain she was speaking better at 0600). BP and CBG WNL, no recent narcotic meds have been given. Pt with distant gaze, mouth gaped open. She will look at examiner, answers questions and follows commands. No visual or neglect noted. Only focal sign is possible left facial asymmetry, which RN states is not new. NIHSS 5 (LOC, LOC questions, L facial palsy, dysarthria).  Pt discussed with RRN and Dr. Selina Cooley. Pt does not meet eligibility for activation of code stroke due to no LVO symptoms and ineligible for TNK due to Eliquis. CT, MRI, EEG ordered by Attending.   Verneda Skill RN Stroke Response

## 2023-08-07 ENCOUNTER — Inpatient Hospital Stay (HOSPITAL_COMMUNITY): Payer: Medicare PPO

## 2023-08-07 DIAGNOSIS — J90 Pleural effusion, not elsewhere classified: Secondary | ICD-10-CM | POA: Diagnosis not present

## 2023-08-07 LAB — GLUCOSE, CAPILLARY
Glucose-Capillary: 92 mg/dL (ref 70–99)
Glucose-Capillary: 94 mg/dL (ref 70–99)

## 2023-08-07 NOTE — Progress Notes (Signed)
Physical Therapy Treatment Patient Details Name: Kristen Hansen MRN: [id number] DOB: 1946-08-17 Today's Date: 08/07/2023   History of Present Illness Pt is 77 yo female presenting to [facility] ED with chest pain. Chest pain has been moving and is increased with palpation.  Pt underwent on 8/1 thoracentesis 1.3 L   and on 8/4 chest tube placement.   PMH: HTN, hyperlipidemia, Lewy body dementia, Sjogren's syndrome, rheumatoid arthritis, chronic pain, DVT, Spine disease, spinal stenosis, diastolic CHF, depression, REM behavior disorder, orthostatic Hypotension, CKD 3, GERD, hypothrydoidism.    PT Comments  Pt admitted with above diagnosis. Pt was able to ambulate a little better today still needing +2 assist for safety as she still leans right at times but was ale to try and correct today. Unsure why pt has episodes some visits and other times she doesn't. Her VSS today except her O2 sat would not register well with activity. Will continue to progress pt as able.  Pt currently with functional limitations due to the deficits listed below (see PT Problem List). Pt will benefit from acute skilled PT to increase their independence and safety with mobility to allow discharge.       If plan is discharge home, recommend the following: A little help with walking and/or transfers;Assistance with cooking/housework;Assist for transportation;Help with stairs or ramp for entrance   Can travel by private vehicle        Equipment Recommendations  None recommended by PT    Recommendations for Other Services       Precautions / Restrictions Precautions Precautions: Fall Precaution Comments: watch BP Restrictions Weight Bearing Restrictions: No     Mobility  Bed Mobility Overal bed mobility: Needs Assistance Bed Mobility: Rolling Rolling: Min assist   Supine to sit: Used rails, Min assist     General bed mobility comments: use of rail and needed some assist for elevation of trunk.  Had to clean pt of BM and  urine before she got up as she was heavily soiled.     Transfers Overall transfer level: Needs assistance Equipment used: Rolling walker (2 wheels) Transfers: Sit to/from Stand, Bed to chair/wheelchair/BSC Sit to Stand: Min assist, From elevated surface, +2 safety/equipment           General transfer comment: Pt needed min assist to power up to  RW with cues for hand placement.  Pt leaning posteriorly on heels needing  verbal a nd tactile cues to lean forward with heavy lean. Pt needed up to min assist. Needed physical  assist and verbal cues to lean forward and stand tall with cues for sequencing.    Ambulation/Gait Ambulation/Gait assistance: +2 safety/equipment, Min assist Gait Distance (Feet): 100 Feet (100 feet then 50 feet) Assistive device: Rolling walker (2 wheels) Gait Pattern/deviations: Trunk flexed, Shuffle, Step-through pattern, Decreased stride length, Decreased step length - right, Decreased step length - left, Drifts right/left, Narrow base of support, Leaning posteriorly, Antalgic   Gait velocity interpretation: <1.31 ft/sec, indicative of household ambulator   General Gait Details: Pt min assist to progress ambulation today with chair follow (+2 assist). Pt needing assist for stability as well as assist to sequence steps and RW. Pt unsteady at times and by end of walk  lean to right needing steadying assist however today pt could self correct a bit.  VSS today although suspicious of O2 with activity as the waveform wasnt reading.  Question if pt holding breath with activity.   Stairs  Wheelchair Mobility     Tilt Bed    Modified Rankin (Stroke Patients Only)       Balance Overall balance assessment: Needs assistance Sitting-balance support: Single extremity supported, No upper extremity supported, Feet supported Sitting balance-Leahy Scale: Fair Sitting balance - Comments: supervision for sitting balance   Standing balance support:  Bilateral upper extremity supported Standing balance-Leahy Scale: Poor Standing balance comment: relies on RW with heavy posterior lean onto heels                            Cognition Arousal: Alert Behavior During Therapy: Flat affect Overall Cognitive Status: History of cognitive impairments - at baseline                                          Exercises      General Comments        Pertinent Vitals/Pain Pain Assessment Pain Assessment: No/denies pain    Home Living                          Prior Function            PT Goals (current goals can now be found in the care plan section) Acute Rehab PT Goals Patient Stated Goal: none reported Progress towards PT goals: Progressing toward goals    Frequency    Min 1X/week      PT Plan Current plan remains appropriate    Co-evaluation              AM-PAC PT "6 Clicks" Mobility   Outcome Measure  Help needed turning from your back to your side while in a flat bed without using bedrails?: A Lot Help needed moving from lying on your back to sitting on the side of a flat bed without using bedrails?: A Lot Help needed moving to and from a bed to a chair (including a wheelchair)?: Total Help needed standing up from a chair using your arms (e.g., wheelchair or bedside chair)?: Total Help needed to walk in hospital room?: Total Help needed climbing 3-5 steps with a railing? : Total 6 Click Score: 8    End of Session Equipment Utilized During Treatment: Gait belt Activity Tolerance: Patient limited by fatigue Patient left: with call bell/phone within reach;with family/visitor present;in chair;with chair alarm set Nurse Communication: Mobility status;Need for lift equipment (use Stedy) PT Visit Diagnosis: Unsteadiness on feet (R26.81);Other abnormalities of gait and mobility (R26.89)     Time: 1006-1040 PT Time Calculation (min) (ACUTE ONLY): 34 min  Charges:     $Gait Training: 23-37 mins PT General Charges $$ ACUTE PT VISIT: 1 Visit                      M,PT Acute Rehab Services [phone]    Bevelyn Buckles 08/07/2023, 12:38 PM

## 2023-08-07 NOTE — Evaluation (Signed)
Clinical/Bedside Swallow Evaluation Patient Details  Name: Kristen Hansen MRN: [id number] Date of Birth: June 17, 1946  Today's Date: 08/07/2023 Time: SLP Start Time (ACUTE ONLY): 1410 SLP Stop Time (ACUTE ONLY): 1420 SLP Time Calculation (min) (ACUTE ONLY): 10 min  Past Medical History:  Past Medical History:  Diagnosis Date   Back pain    Cancer (HCC)    Skin, basal cell   Dyspnea    Fatigue    GERD (gastroesophageal reflux disease)    Headache(784.0)    history of migraines   History of kidney stones    Hypercholesteremia    Hypertension    Leg swelling    Bilateral   Occasional tremors    mild hand tremors   Osteoarthritis    PONV (postoperative nausea and vomiting)    RBD (REM behavioral disorder)    Rheumatoid arthritis(714.0)    Scoliosis of lumbar spine    Spinal stenosis    Multiple levels   Tic [place]    Trigeminal neuralgia 03/30/2015   Vision abnormalities    Past Surgical History:  Past Surgical History:  Procedure Laterality Date   ABDOMINAL HYSTERECTOMY     AUGMENTATION MAMMAPLASTY Bilateral    COLONOSCOPY     IR THORACENTESIS ASP PLEURAL SPACE W/IMG GUIDE  07/26/2023   JOINT REPLACEMENT     right uni knee, right hip with 2 revisions   KIDNEY STONE SURGERY Bilateral    KNEE ARTHROTOMY Right 02/27/2018   Procedure: RIGHT KNEE ARTHROTOMY; scar excision;  Surgeon: Ollen Gross, MD;  Location: [facility];  Service: Orthopedics;  Laterality: Right;   MOHS SURGERY     RIGHT/LEFT HEART CATH AND CORONARY ANGIOGRAPHY N/A 07/30/2023   Procedure: RIGHT/LEFT HEART CATH AND CORONARY ANGIOGRAPHY;  Surgeon: Kathleene Hazel, MD;  Location: [facility];  Service: Cardiovascular;  Laterality: N/A;   TONSILLECTOMY     TOTAL HIP ARTHROPLASTY     TOTAL KNEE ARTHROPLASTY  01/22/2013   Procedure: TOTAL KNEE ARTHROPLASTY;  Surgeon: Loanne Drilling, MD;  Location: [facility];  Service: Orthopedics;  Laterality: Right;  Revision of a Right Uni Knee to a Total Knee  Arthroplasty   HPI:  Patient is a 77 y.o. female with PMH: Lewy body dementia, HTN, HLD, Sjogren's syndrome, RA, chronic pain, DVT, spinal stenosis, diastolic CHF, depression, REM behavior disorder, orthostatic hypotension, CKD 3, GERD, hypothyroidism. She presented to the hospital on 06/27/23 with chest pain.  CT PE study showed no PE but did show a large right pleural effusion with compressive atelectasis.  ardiology was consulted in ED and had low suspicion for MI based on EKG review. CXR showed Moderate right pleural effusion, increased since the prior  radiograph. She had MBS completed at [facility] on 05/15/2023 which reported decline of swallow function since 2022 MBS consisting of "mistiming of the pharyngeal swallow, decreased bolus propulsion (base of tongue retraction), incomplete laryngeal vestibule closure, and diminished laryngeal pharyngeal sensation. Deficits result in mild post-swallow residual in the valleculae; penetration of mildly-thick liquid, and puree; and penetration AND aspiration of thin liquid." She was exhibiting stroke like symptoms on 8/6 and was made NPO. MRI negative for acute intracranial changes. On 8/12 pt again had vomiting and AMS and SLP reordered.    Assessment / Plan / Recommendation  Clinical Impression  Pt demonstrates no acute changes to swallowing function. She has a known history of dysphagia and though thickened liquids reduce risk of aspiration, she and her husband have elected to consume thin liquids to  avoid risk of dehydration as pt was refusing drinks. She has been tolerating meals, today enjoyed potato soup. During SLP eval, prior to any sips of water, pt had an episode of gagging and spitting up clear saliva. There was no change in mentation. Pt also reported some hoarse vocal quality. Questioned pt about potential reflux which she denied. Recommend pt continue current diet with known dysphagia. Will sign off again. SLP Visit Diagnosis: Dysphagia,  unspecified (R13.10)    Aspiration Risk  Moderate aspiration risk    Diet Recommendation Dysphagia 3 (Mech soft);Thin liquid;Nectar-thick liquid    Liquid Administration via: Straw;Cup Medication Administration: Whole meds with puree Supervision: Patient able to self feed Compensations: Small sips/bites;Slow rate    Other  Recommendations      Recommendations for follow up therapy are one component of a multi-disciplinary discharge planning process, led by the attending physician.  Recommendations may be updated based on patient status, additional functional criteria and insurance authorization.  Follow up Recommendations No SLP follow up      Assistance Recommended at Discharge    Functional Status Assessment Patient has had a recent decline in their functional status and demonstrates the ability to make significant improvements in function in a reasonable and predictable amount of time.  Frequency and Duration            Prognosis Prognosis for improved oropharyngeal function: Guarded      Swallow Study   General HPI: Patient is a 77 y.o. female with PMH: Lewy body dementia, HTN, HLD, Sjogren's syndrome, RA, chronic pain, DVT, spinal stenosis, diastolic CHF, depression, REM behavior disorder, orthostatic hypotension, CKD 3, GERD, hypothyroidism. She presented to the hospital on 06/27/23 with chest pain.  CT PE study showed no PE but did show a large right pleural effusion with compressive atelectasis.  ardiology was consulted in ED and had low suspicion for MI based on EKG review. CXR showed Moderate right pleural effusion, increased since the prior  radiograph. She had MBS completed at [facility] on 05/15/2023 which reported decline of swallow function since 2022 MBS consisting of "mistiming of the pharyngeal swallow, decreased bolus propulsion (base of tongue retraction), incomplete laryngeal vestibule closure, and diminished laryngeal pharyngeal sensation. Deficits result in  mild post-swallow residual in the valleculae; penetration of mildly-thick liquid, and puree; and penetration AND aspiration of thin liquid." She was exhibiting stroke like symptoms on 8/6 and was made NPO. MRI negative for acute intracranial changes. On 8/12 pt again had vomiting and AMS and SLP reordered. Type of Study: Bedside Swallow Evaluation Previous Swallow Assessment: MBS at [facility] (04/25/23), MBS 8/4 Diet Prior to this Study: Dysphagia 3 (mechanical soft);Thin liquids (Level 0) Temperature Spikes Noted: No Respiratory Status: Room air History of Recent Intubation: No Behavior/Cognition: Alert;Cooperative;Pleasant mood Oral Care Completed by SLP: Yes Oral Cavity - Dentition: Adequate natural dentition Vision: Functional for self-feeding Self-Feeding Abilities: Able to feed self Patient Positioning: Upright in chair Baseline Vocal Quality: Hoarse Volitional Cough: Weak Volitional Swallow: Able to elicit    Oral/Motor/Sensory Function Overall Oral Motor/Sensory Function: Within functional limits   Ice Chips     Thin Liquid Thin Liquid: Within functional limits Presentation: Straw    Nectar Thick Nectar Thick Liquid: Not tested   Honey Thick Honey Thick Liquid: Not tested   Puree Puree: Not tested   Solid     Solid: Not tested      Claudine Mouton 08/07/2023,2:32 PM

## 2023-08-07 NOTE — Plan of Care (Signed)
  Problem: Education: Goal: Knowledge of General Education information will improve Description: Including pain rating scale, medication(s)/side effects and non-pharmacologic comfort measures Outcome: Progressing   Problem: Health Behavior/Discharge Planning: Goal: Ability to manage health-related needs will improve Outcome: Progressing   Problem: Clinical Measurements: Goal: Ability to maintain clinical measurements within normal limits will improve Outcome: Progressing Goal: Will remain free from infection Outcome: Progressing Goal: Diagnostic test results will improve Outcome: Progressing Goal: Respiratory complications will improve Outcome: Progressing Goal: Cardiovascular complication will be avoided Outcome: Progressing   Problem: Activity: Goal: Risk for activity intolerance will decrease Outcome: Progressing   Problem: Nutrition: Goal: Adequate nutrition will be maintained Outcome: Progressing   Problem: Coping: Goal: Level of anxiety will decrease Outcome: Progressing   Problem: Elimination: Goal: Will not experience complications related to bowel motility Outcome: Progressing Goal: Will not experience complications related to urinary retention Outcome: Progressing   Problem: Pain Managment: Goal: General experience of comfort will improve Outcome: Progressing   Problem: Safety: Goal: Ability to remain free from injury will improve Outcome: Progressing   Problem: Skin Integrity: Goal: Risk for impaired skin integrity will decrease Outcome: Progressing   Problem: Activity: Goal: Ability to tolerate increased activity will improve Outcome: Progressing   Problem: Clinical Measurements: Goal: Ability to maintain a body temperature in the normal range will improve Outcome: Progressing   Problem: Respiratory: Goal: Ability to maintain adequate ventilation will improve Outcome: Progressing Goal: Ability to maintain a clear airway will improve Outcome:  Progressing   Problem: Education: Goal: Understanding of CV disease, CV risk reduction, and recovery process will improve Outcome: Progressing Goal: Individualized Educational Video(s) Outcome: Progressing   Problem: Activity: Goal: Ability to return to baseline activity level will improve Outcome: Progressing   Problem: Cardiovascular: Goal: Ability to achieve and maintain adequate cardiovascular perfusion will improve Outcome: Progressing Goal: Vascular access site(s) Level 0-1 will be maintained Outcome: Progressing   Problem: Health Behavior/Discharge Planning: Goal: Ability to safely manage health-related needs after discharge will improve Outcome: Progressing

## 2023-08-07 NOTE — Progress Notes (Signed)
PROGRESS NOTE    Kristen Hansen  HKV:[id number] DOB: 11-Jun-1946 DOA: 07/25/2023 PCP: Kerin Salen, PA-C   Brief Narrative:  Kristen Hansen is a 77 y.o. female with medical history significant of hypertension, hyperlipidemia, Lewy body dementia, Sjogren's syndrome, rheumatoid arthritis, chronic pain, DVT, spinal stenosis, diastolic CHF, depression, REM behavior disorder, orthostatic hypotension, CKD 3, GERD, hyperthyroidism presenting with chest pain. Her chest pain has atypical nature and present for 2 weeks intermittently. She was found to have a moderate pleural effusion. Cardiology consulted, s/p cardiac cath with clean coronaries.    08/03/2023: Patient seen alongside patient's daughter.  Also discussed with patient's husband over the phone.  No new complaints.  Patient continues to improve.  Patient has done on colchicine.  Plan is to discharge patient to [facility] for short-term rehab.  08/04/2023: Patient seen alongside patient's husband.  No new complaints.  Patient is awaiting disposition.  Continue to optimize blood pressure.  08/05/2023: Patient seen alongside patient's husband.  No new complaints.  Low sodium and low potassium noted.  Dry buccal mucosa is noted.  Gentle hydration (normal saline plus KCl 20 mEq at 50 cc/h).  Monitor renal function and electrolytes.  Pursue disposition.  08/06/2023: Patient was reported to be leaning towards 1 side.  MRI brain, CT head and EEG done today were nonrevealing.  Elevated blood pressure noted.  Continue to monitor closely.  08/07/2023: Patient seen.  No new complaints.  No temperature.  Vitals are stable.  Chest x-ray did not reveal any significant findings.  Renal panel done today revealed sodium of 128, potassium of 5.1, CO2 of 18, BUN of 7 with serum creatinine of 0.99.  Patient has remained stable.  Pursue disposition.  Input from the speech therapy team is appreciated.  Assessment & Plan:   Principal Problem:   Pleural  effusion Active Problems:   Rheumatoid arthritis (HCC)   HTN (hypertension)   Chronic bilateral low back pain without sciatica   Depression   Lewy body dementia without behavioral disturbance, psychotic disturbance, mood disturbance, or anxiety (HCC)   Spinal stenosis of lumbar region   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)   Chronic heart failure with preserved ejection fraction (HCC)   CKD (chronic kidney disease) stage 3, GFR 30-59 ml/min (HCC)   Gastroesophageal reflux disease with esophagitis   Mixed hyperlipidemia   Chest pain   Sjogren's syndrome (HCC)   Acute idiopathic pericarditis   PAF (paroxysmal atrial fibrillation) (HCC)   Need for management of chest tube   Chest Pain, exudative and recurrent right-sided pleural Effusion, Pericardial Effusion - Chest pain improved after thoracentesis. Given re-accumulation of fluid and exudative nature, PCCM was consulted. Appreciate their assistance. They placed a chest tube and are managing that.  Still with chest tube to suction.  On discharge, will have her follow-up with pulmonary outpatient along with rheumatology.    Acute on chronic diastolic CHF EF 50 to 55%, and some preceding history of exertional shortness of breath.   Pt had episode of worsening dyspnea on morning of 08/04 with coughing, CXR obtained showed volume overload. She was given IV lasix with improvement in her symptoms.  Cardiology on board.  Underwent cath which showed clean coronaries.  Has been started on Lasix 20 mg p.o. daily on 08/01/2023. 08/03/2023: Stable. 08/06/2023: Cautious hydration.  Monitor cardiac symptoms closely.   Pericardial Effusion She has mild pericarditis and is currently on colchicine for pericardial effusion along with aspirin per cardiology. 08/03/2023: Treat  pericarditis for 3 months with colchicine.   Paroxysmal Atrial Fibrillation: Cardiology following, rates controlled, on amiodarone and metoprolol oral as well as  heparin drip, will switch to DOAC once chest tube is out.  Dysphagia: OP work up ongoing. EGD without any acute findings. Getting swallowing therapy. SLP following pt. Repeat MBBS shows pt has some retention of contrast and some aspiration concern but overall unchanged from prior study and likely secondary to progressing lewy body dementia. Placed on Dys 3 Diet here. Will need follow up with palliative care OP (see below).    Lewy Body Dementia: On pyridostigmine. Last saw neurologist one month ago. Appears to be progressing and pt may benefit from palliative care consult (see below).   Acute encephalopathy/delirium: Resolved as currently she is fully alert and oriented to person place and year but not month.  Husband at the bedside verifies that this is her baseline.   GOC: Pt with lewey body dementia and multiple co-morbidities and worsening dysphagia. She may benefit from OP palliative care consult.    Orthostatic Hypotension: Pt with orthostatic hypotension. Suspect this is secondary to her lewy body dementia.  Continue TED hose.  Would encourage her to change positions slowly. Per husband, she has some dizziness at home.    Hyponatremia:  -Sodium is 128 today.Marland Kitchen   Hypomagnesemia/Hypokalemia:  Mag is 2.1 today and potassium of 5.1.     Hx of DVT: D dimer elevated,  CTA negative, lower extremity doppler which was negative for DVT.  Now on heparin drip for A-fib.     Hyperthyroidism: Recently started on methimazole. States she is complaint with this. Will continue here. FT4 normal and FT3 slightly decreased at 1.7. Thyroid ultrasound ordered and shows bilateral nodules that do not meet criteria for biopsy for dedicated follow up.  Will need OP endocrinology follow up.    Left Shoulder Pain: Has good PROM but pain with active ROM. Concern for RT injury. MRI was obtained that showed moderate to severe glenohumeral OA and mild RT tendinosis and mild degenerative changes of the [facility] joint and mild  subacromial bursitis. Conservative measures for now.  Overall better, outpatient orthopedics follow-up postdischarge. PCP to arrange.   HTN:  Home meds: amlodipine 5 mg every day, irbesartan 300 mg every day, lopressor 25 mg BID.  Patient currently on Aldactone, irbesartan, metoprolol.  Blood pressure fairly controlled.   GERD: Continue home med protonix. Does not endorse symptoms of reflux.    Sjogren's Syndrome: Not on any pharmacotherapy.  Likely causing pericardial effusion and exudative right-sided pleural effusion, currently on colchicine for pericardial effusion may require outpatient rheumatology follow-up postdischarge.   RA: Was on enbrel. Last used 2 months ago.    Chronic back pain: Tylenol prn   MDD: Zoloft 50 mg every day. Continued here.    Trigeminal Neuralgia: On lamictal and trileptal. Both continued here.  Code stroke/ ? TIA: Patient was noted to have right-sided weakness by RN at around 6:30 AM. LKN was at about 9:00 PM last night when RN administered her meds. The patient was drowsy and poorly cooperative at that time, which were attributed to a recent Klonopin dose .  Code stroke was called, seen by neurology.  Underwent CT head, CTA head and neck and MRI brain and was ruled out of stroke.  EEG negative as well.  Patient back at her baseline mental status.  Neurology signed off. 08/06/2023: Patient noted to be leaning towards 1 side.  MRI brain, CT head and EEG all  came back negative.    DVT prophylaxis: Place TED hose Start: 07/27/23 1141   Code Status: Full Code  Family Communication: Husband present at bedside.   Status is: Inpatient Remains inpatient appropriate because: Patient with chest tube   Estimated body mass index is 17.68 kg/m as calculated from the following:   Height as of this encounter: 5\' 10"  (1.778 m).   Weight as of this encounter: 55.9 kg.  Pressure Injury 08/01/23 Sacrum Medial Deep Tissue Pressure Injury - Purple or maroon localized area of  discolored intact skin or blood-filled blister due to damage of underlying soft tissue from pressure and/or shear. (Active)  08/01/23 0000  Location: Sacrum  Location Orientation: Medial  Staging: Deep Tissue Pressure Injury - Purple or maroon localized area of discolored intact skin or blood-filled blister due to damage of underlying soft tissue from pressure and/or shear.  Wound Description (Comments):   Present on Admission:   Dressing Type Foam - Lift dressing to assess site every shift 08/07/23 0846   Nutritional Assessment: Body mass index is 17.68 kg/m.Marland Kitchen Seen by dietician.  I agree with the assessment and plan as outlined below: Nutrition Status:        . Skin Assessment: I have examined the patient's skin and I agree with the wound assessment as performed by the wound care RN as outlined below: Pressure Injury 08/01/23 Sacrum Medial Deep Tissue Pressure Injury - Purple or maroon localized area of discolored intact skin or blood-filled blister due to damage of underlying soft tissue from pressure and/or shear. (Active)  08/01/23 0000  Location: Sacrum  Location Orientation: Medial  Staging: Deep Tissue Pressure Injury - Purple or maroon localized area of discolored intact skin or blood-filled blister due to damage of underlying soft tissue from pressure and/or shear.  Wound Description (Comments):   Present on Admission:   Dressing Type Foam - Lift dressing to assess site every shift 08/07/23 0846    Consultants:  Neurology, critical care and cardiology  Procedures:  As above  Antimicrobials:  Anti-infectives (From admission, onward)    Start     Dose/Rate Route Frequency Ordered Stop   08/01/23 1600  piperacillin-tazobactam (ZOSYN) IVPB 3.375 g  Status:  Discontinued        3.375 g 12.5 mL/hr over 240 Minutes Intravenous Every 8 hours 08/01/23 0843 08/02/23 1547   08/01/23 0930  piperacillin-tazobactam (ZOSYN) IVPB 3.375 g        3.375 g 100 mL/hr over 30 Minutes  Intravenous  Once 08/01/23 0843 08/01/23 1042   07/26/23 0540  valACYclovir (VALTREX) tablet 1,000 mg        1,000 mg Oral Every 12 hours PRN 07/26/23 0541           Subjective: Patient seen No chest pain No shortness of breath.  Objective: Vitals:   08/07/23 0807 08/07/23 1220 08/07/23 1549 08/07/23 1955  BP: (!) 147/65 (!) 130/98 (!) 163/79 (!) 131/59  Pulse: 62 (!) 58 (!) 57 (!) 58  Resp: 14 15 18 14   Temp: (!) 97.5 F (36.4 C) (!) 97.3 F (36.3 C) 98.4 F (36.9 C) 97.8 F (36.6 C)  TempSrc: Oral Oral Oral Oral  SpO2: 99% 100% 98% 99%  Weight:      Height:        Intake/Output Summary (Last 24 hours) at 08/07/2023 2013 Last data filed at 08/07/2023 0414 Gross per 24 hour  Intake 220 ml  Output 250 ml  Net -30 ml   [employer]  08/05/23 0500 08/06/23 0318 08/07/23 0408  Weight: 55.9 kg 57.9 kg 55.9 kg    Examination:  General exam: Patient is thin.  Parkinsonian features.  Not in any distress.   Respiratory system: Decreased air entry.   Cardiovascular system: S1 & S2 heard. Gastrointestinal system: Abdomen is soft and nontender.   Central nervous system: Awake and alert.  Moves all extremities.  Parkinsonian features.   Extremities: No leg edema.   Data Reviewed: I have personally reviewed following labs and imaging studies  CBC: Recent Labs  Lab 08/01/23 0638 08/02/23 0149 08/03/23 0628  WBC 6.7 6.9 7.1  NEUTROABS 4.8 4.7 4.8  HGB 9.8* 9.3* 9.7*  HCT 30.9* 28.2* 30.8*  MCV 82.6 82.9 83.0  PLT 266 279 287   Basic Metabolic Panel: Recent Labs  Lab 08/01/23 0638 08/02/23 0149 08/03/23 0628 08/04/23 0154 08/04/23 0230 08/06/23 0128 08/06/23 1219  NA 132* 132* 133* 130*  --  130* 128*  K 3.4* 3.3* 3.2* 3.1*  --  5.3* 5.1  CL 100 102 102 101  --  101 99  CO2 19* 21* 21* 21*  --  20* 18*  GLUCOSE 71 89 105* 88  --  85 96  BUN 13 11 9  6*  --  6* 7*  CREATININE 0.88 1.23* 0.90 1.19*  --  0.93 0.99  CALCIUM 8.1* 7.7* 8.2* 8.3*  --  8.4*  8.3*  MG 1.6* 1.9 1.7  --  1.7 2.1  --   PHOS  --   --   --   --   --  2.7  --    GFR: Estimated Creatinine Clearance: 42.7 mL/min (by C-G formula based on SCr of 0.99 mg/dL). Liver Function Tests: Recent Labs  Lab 08/01/23 8657 08/02/23 0149 08/03/23 0628 08/06/23 0128  AST 26 31 21   --   ALT 22 23 21   --   ALKPHOS 141* 131* 133*  --   BILITOT 0.5 0.3 0.4  --   PROT 4.9* 4.7* 5.1*  --   ALBUMIN 1.9* 1.9* 2.1* 2.2*   No results for input(s): "LIPASE", "AMYLASE" in the last 168 hours. No results for input(s): "AMMONIA" in the last 168 hours.  Coagulation Profile: No results for input(s): "INR", "PROTIME" in the last 168 hours.  Cardiac Enzymes: No results for input(s): "CKTOTAL", "CKMB", "CKMBINDEX", "TROPONINI" in the last 168 hours. BNP (last 3 results) No results for input(s): "PROBNP" in the last 8760 hours. HbA1C: No results for input(s): "HGBA1C" in the last 72 hours. CBG: Recent Labs  Lab 08/06/23 1316 08/06/23 2301 08/07/23 1802  GLUCAP 103* 105* 92   Lipid Profile: No results for input(s): "CHOL", "HDL", "LDLCALC", "TRIG", "CHOLHDL", "LDLDIRECT" in the last 72 hours. Thyroid Function Tests: No results for input(s): "TSH", "T4TOTAL", "FREET4", "T3FREE", "THYROIDAB" in the last 72 hours. Anemia Panel: No results for input(s): "VITAMINB12", "FOLATE", "FERRITIN", "TIBC", "IRON", "RETICCTPCT" in the last 72 hours. Sepsis Labs: Recent Labs  Lab 08/01/23 0638 08/02/23 0149 08/03/23 0628  PROCALCITON 0.24 0.18 <0.10    Recent Results (from the past 240 hour(s))  Body fluid culture w Gram Stain     Status: None   Collection Time: 08/01/23  7:25 AM   Specimen: Pleura; Body Fluid  Result Value Ref Range Status   Specimen Description PLEURAL RIGHT  Final   Special Requests NONE  Final   Gram Stain NO WBC SEEN NO ORGANISMS SEEN   Final   Culture   Final    NO GROWTH  3 DAYS Performed at [facility], [address], [place], [place] [address]     Report Status 08/04/2023 FINAL  Final     Radiology Studies: DG CHEST PORT 1 VIEW  Result Date: 08/07/2023 CLINICAL DATA:  Aspiration pneumonia EXAM: PORTABLE CHEST 1 VIEW COMPARISON:  08/03/2023 FINDINGS: Heart and mediastinum are normal. Small, layering bilateral pleural effusions. No acute appearing airspace opacity. Osseous structures unremarkable. IMPRESSION: Small layering bilateral pleural effusions without acute airspace opacity. Electronically Signed   By: Jearld Lesch M.D.   On: 08/07/2023 14:01   EEG adult  Result Date: 08/06/2023 Charlsie Quest, MD     08/06/2023  4:27 PM Patient Name: Jowanda Kempf Mayr MRN: [id number] Epilepsy Attending: Charlsie Quest Referring Physician/Provider: Barnetta Chapel, MD Date: 08/06/2023 Duration: 22.22 mins Patient history: 77yo F with ams getting eeg to evaluate for seizure Level of alertness: Awake AEDs during EEG study: [employer] Technical aspects: This EEG study was done with scalp electrodes positioned according to the 10-20 International system of electrode placement. Electrical activity was reviewed with band pass filter of 1-70Hz , sensitivity of 7 uV/mm, display speed of 46mm/sec with a 60Hz  notched filter applied as appropriate. EEG data were recorded continuously and digitally stored.  Video monitoring was available and reviewed as appropriate. Description: The posterior dominant rhythm consists of 7 Hz activity of moderate voltage (25-35 uV) seen predominantly in posterior head regions, symmetric and reactive to eye opening and eye closing. EEG showed continuous generalized predominantly 5 to 7 Hz theta slowing admixed with intermittent generalized 2-3hz  delta slowing. Hyperventilation and photic stimulation were not performed.   ABNORMALITY - Continuous slow, generalized IMPRESSION: This study is suggestive of moderate diffuse encephalopathy, nonspecific etiology. No seizures or epileptiform discharges were seen throughout the recording. Charlsie Quest   MR BRAIN WO CONTRAST  Result Date: 08/06/2023 CLINICAL DATA:  Transient ischemic attack. EXAM: MRI HEAD WITHOUT CONTRAST TECHNIQUE: Multiplanar, multiecho pulse sequences of the brain and surrounding structures were obtained without intravenous contrast. COMPARISON:  Brain MRI 07/31/2023. FINDINGS: Brain: No acute infarct or hemorrhage. 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 No mass or midline shift. No hydrocephalus or extra-axial collection. Few foci of chronic hemosiderin deposition in the deep cerebral white matter. Vascular: Normal flow voids. Skull and upper cervical spine: Normal marrow signal. Sinuses/Orbits: No acute findings. Other: None. IMPRESSION: 1. No acute intracranial process. 2. 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 Electronically Signed   By: Orvan Falconer M.D.   On: 08/06/2023 16:04   CT HEAD WO CONTRAST ( )  Result Date: 08/06/2023 CLINICAL DATA:  Transient ischemic attack. EXAM: CT HEAD WITHOUT CONTRAST TECHNIQUE: Contiguous axial images were obtained from the base of the skull through the vertex without intravenous contrast. RADIATION DOSE REDUCTION: This exam was performed according to the departmental dose-optimization program which includes automated exposure control, adjustment of the mA and/or kV according to patient size and/or use of iterative reconstruction technique. COMPARISON:  Brain MRI 624.  Head CT 07/31/2023. FINDINGS: Brain: No acute hemorrhage. Unchanged mild chronic small-vessel disease with old lacunar infarct in the right cerebellar hemisphere. Cortical gray-white differentiation is otherwise preserved. Prominence of the ventricles and sulci within expected range for age. No hydrocephalus or extra-axial collection. No mass effect or midline shift. Vascular: No hyperdense vessel or unexpected calcification. Skull: No calvarial  fracture or suspicious bone lesion. Skull base is unremarkable. Sinuses/Orbits: No  acute finding. Other: None. IMPRESSION: 1. No acute intracranial abnormality. 2. Unchanged mild chronic small-vessel disease with old lacunar infarct in the right cerebellar hemisphere. Electronically Signed   By: Orvan Falconer M.D.   On: 08/06/2023 15:58    Scheduled Meds:  amiodarone  200 mg Oral Daily   apixaban  5 mg Oral BID   colchicine  0.6 mg Oral Daily   guaiFENesin  600 mg Oral BID   irbesartan  150 mg Oral Daily   lamoTRIgine  150 mg Oral Daily   methIMAzole  10 mg Oral Daily   metoprolol tartrate  25 mg Oral BID   montelukast  10 mg Oral QHS   mouth rinse  15 mL Mouth Rinse 4 times per day   Oxcarbazepine  300 mg Oral BID   pantoprazole  40 mg Oral Daily   pravastatin  40 mg Oral QAC breakfast   pyridostigmine  60 mg Oral Q8H   sertraline  50 mg Oral Daily   sodium chloride flush  3 mL Intravenous Q12H   sodium chloride flush  3 mL Intravenous Q12H   spironolactone  12.5 mg Oral Daily   Continuous Infusions:  sodium chloride     sodium chloride 50 mL/hr at 08/07/23 0318     LOS: 13 days   Barnetta Chapel, MD [facility]  08/07/2023, 8:13 PM   *Please note that this is a verbal dictation therefore any spelling or grammatical errors are due to the "Dragon Medical One" system interpretation.  Please [place] via Amion and do not message via secure chat for urgent patient care matters. Secure chat can be used for non urgent patient care matters.  How to contact the [facility] Attending or Consulting provider 7A - 7P or covering provider during after hours 7P -7A, for this patient?  Check the care team in [facility] and look for a) attending/consulting [facility] provider listed and b) the [facility] team listed. [place] or secure chat 7A-7P. Log into [email or web address] and use [place]'s universal password to access. If you do not have the password, please contact the hospital operator. Locate the [facility] provider  you are looking for under [facility] and [place] to a number that you can be directly reached. If you still have difficulty reaching the provider, please [place] the [facility] (Director on Call) for the Hospitalists listed on amion for assistance.

## 2023-08-07 NOTE — Plan of Care (Signed)
  Problem: Health Behavior/Discharge Planning: Goal: Ability to manage health-related needs will improve Outcome: Progressing   Problem: Clinical Measurements: Goal: Ability to maintain clinical measurements within normal limits will improve Outcome: Progressing Goal: Will remain free from infection Outcome: Progressing Goal: Diagnostic test results will improve Outcome: Progressing Goal: Respiratory complications will improve Outcome: Progressing Goal: Cardiovascular complication will be avoided Outcome: Progressing   Problem: Nutrition: Goal: Adequate nutrition will be maintained Outcome: Progressing   Problem: Safety: Goal: Ability to remain free from injury will improve Outcome: Progressing   Problem: Skin Integrity: Goal: Risk for impaired skin integrity will decrease Outcome: Progressing

## 2023-08-07 NOTE — Progress Notes (Signed)
Occupational Therapy Treatment Patient Details Name: Kristen Hansen MRN: [id number] DOB: 13-Jul-1946 Today's Date: 08/07/2023   History of present illness Pt is 77 yo female presenting to [facility] ED with chest pain. Chest pain has been moving and is increased with palpation.  Pt underwent on 8/1 thoracentesis 1.3 L   and on 8/4 chest tube placement.   PMH: HTN, hyperlipidemia, Lewy body dementia, Sjogren's syndrome, rheumatoid arthritis, chronic pain, DVT, Spine disease, spinal stenosis, diastolic CHF, depression, REM behavior disorder, orthostatic Hypotension, CKD 3, GERD, hypothrydoidism.   OT comments  Patient sitting upright in bedside chair upon arrival into room and husband at bedside.  Patient completed unsupported sitting task of UB grooming.  Patient required verbal cues to use chair arm rests to pull trunk forward into upright sitting position and from there patient unable to maintain unsupported sitting balance 2/2 fatigue in which patient required max A for unsupported sitting balance to wash face.  Patient required max A x2 for supine scoot in bedside chair for better seated postioning. Patient left with all needs within reach.      If plan is discharge home, recommend the following:  A lot of help with walking and/or transfers;A lot of help with bathing/dressing/bathroom;Assistance with cooking/housework;Direct supervision/assist for medications management;Direct supervision/assist for financial management;Help with stairs or ramp for entrance;Assist for transportation;Assistance with feeding   Equipment Recommendations  None recommended by OT    Recommendations for Other Services      Precautions / Restrictions Precautions Precautions: Fall Precaution Comments: watch BP Restrictions Weight Bearing Restrictions: No       Mobility Bed Mobility                    Transfers Overall transfer level: Needs assistance Equipment used: Rolling walker (2 wheels) Transfers: Sit  to/from Stand Sit to Stand: Max assist (from bedside chair 2/2 increased fatigue after working with PT)                 Balance Overall balance assessment: Needs assistance Sitting-balance support: No upper extremity supported, Feet supported Sitting balance-Leahy Scale: Poor Sitting balance - Comments:  (Max A for unsupported sitting in bedside chair)                                   ADL either performed or assessed with clinical judgement   ADL Overall ADL's : Needs assistance/impaired     Grooming: Set up;Moderate assistance (MOD a FOR UNSUPPORTED SITTING 2/2 POSTERIOR LEAN, SETUP FOR WASHING FACE.  2/2 patient being lethargic, oral hygiene held this date)                                      Extremity/Trunk Assessment Upper Extremity Assessment Upper Extremity Assessment: Generalized weakness            Vision       Perception     Praxis      Cognition Arousal: Lethargic Behavior During Therapy: Flat affect Overall Cognitive Status: History of cognitive impairments - at baseline                                          Exercises      Shoulder Instructions  General Comments      Pertinent Vitals/ Pain          Home Living                                          Prior Functioning/Environment              Frequency  Min 1X/week        Progress Toward Goals  OT Goals(current goals can now be found in the care plan section)  Progress towards OT goals: Progressing toward goals  Acute Rehab OT Goals OT Goal Formulation: With patient/family Time For Goal Achievement: 08/10/23 Potential to Achieve Goals: Good ADL Goals Pt Will Perform Upper Body Bathing: with supervision;sitting Pt/caregiver will Perform Home Exercise Program: Increased strength;Both right and left upper extremity;With theraband;With theraputty;With minimal assist;With written HEP provided  Plan  Discharge plan remains appropriate    Co-evaluation                 AM-PAC OT "6 Clicks" Daily Activity     Outcome Measure   Help from another person eating meals?: A Little Help from another person taking care of personal grooming?: A Lot Help from another person toileting, which includes using toliet, bedpan, or urinal?: Total Help from another person bathing (including washing, rinsing, drying)?: A Lot Help from another person to put on and taking off regular upper body clothing?: A Lot Help from another person to put on and taking off regular lower body clothing?: Total 6 Click Score: 11    End of Session Equipment Utilized During Treatment: Gait belt;Rolling walker (2 wheels)  OT Visit Diagnosis: Unsteadiness on feet (R26.81);Other abnormalities of gait and mobility (R26.89);Muscle weakness (generalized) (M62.81);Other symptoms and signs involving cognitive function   Activity Tolerance Patient limited by fatigue   Patient Left in chair;with call bell/phone within reach   Nurse Communication Other (comment) (lethargic)        Time: 1100-1120 OT Time Calculation (min): 20 min  Charges: OT General Charges $OT Visit: 1 Visit OT Treatments $Self Care/Home Management : 8-22 mins  Governor Specking OT/L  Denice Paradise 08/07/2023, 3:28 PM

## 2023-08-07 NOTE — TOC Progression Note (Addendum)
Transition of Care [facility]) - Progression Note    Patient Details  Name: Kristen Hansen MRN: [id number] Date of Birth: 11-02-1946  [facility] CM/SW Contact  Delilah Shan, LCSWA Phone Number: 08/07/2023, 11:16 AM  Clinical Narrative:     Update- Patients insurance authorization was approved for SNF. Plan Auth ID# [id number] Auth ID# [id number]. Insurance authorization approved from 8/13-8/15. CSW informed MD and facility. Facility informed CSW that they can accept patient tomorrow if medically ready.  Patients insurance authorization currently pending. Patient has SNF bed at [facility]. CSW will continue to follow and assist with patients dc planning needs.    Expected Discharge Plan: Skilled Nursing Facility Barriers to Discharge: Continued Medical Work up  Expected Discharge Plan and Services In-house Referral: Clinical Social Work Discharge Planning Services: CM Consult   Living arrangements for the past 2 months: Single Family Home                   DME Agency: NA                   Social Determinants of Health (SDOH) Interventions SDOH Screenings   Food Insecurity: No Food Insecurity (07/25/2023)  Housing: Low Risk  (07/25/2023)  Transportation Needs: No Transportation Needs (07/25/2023)  Utilities: Not At Risk (07/25/2023)  Financial Resource Strain: Low Risk  (08/22/2022)   [facility], [facility] to [facility]24/[facility]23[facility] visits prior to 02/24/2023.  Physical Activity: Inactive (08/22/2022)   [facility], [facility] prior to 02/24/2023[facility] visits prior to 02/24/2023.  Social Connections: Socially Integrated (08/22/2022)   [facility], [facility] prior to 02/24/2023., [facility] visits prior to 02/24/2023.   Stress: No Stress Concern Present (08/22/2022)   [facility] prior to 02/24/2023., [facility] visits prior to 02/24/2023.  Tobacco Use: Low Risk  (07/25/2023)    Readmission Risk Interventions     No data to display

## 2023-08-08 DIAGNOSIS — J90 Pleural effusion, not elsewhere classified: Secondary | ICD-10-CM | POA: Diagnosis not present

## 2023-08-08 LAB — GLUCOSE, CAPILLARY
Glucose-Capillary: 77 mg/dL (ref 70–99)
Glucose-Capillary: 82 mg/dL (ref 70–99)

## 2023-08-08 MED ORDER — ACETAMINOPHEN 325 MG PO TABS: 650.0000 mg | ORAL_TABLET | Freq: Four times a day (QID) | ORAL | 1 refills | Status: AC | PRN

## 2023-08-08 MED ORDER — AMIODARONE HCL 200 MG PO TABS: 200.0000 mg | ORAL_TABLET | Freq: Every day | ORAL | 1 refills | Status: DC

## 2023-08-08 MED ORDER — METOPROLOL TARTRATE 25 MG PO TABS: 25.0000 mg | ORAL_TABLET | Freq: Two times a day (BID) | ORAL | 1 refills | Status: AC

## 2023-08-08 MED ORDER — PYRIDOSTIGMINE BROMIDE 60 MG PO TABS: 60.0000 mg | ORAL_TABLET | Freq: Three times a day (TID) | ORAL | 1 refills | Status: DC

## 2023-08-08 MED ORDER — POLYETHYLENE GLYCOL 3350 17 G PO PACK: 17.0000 g | PACK | Freq: Every day | ORAL | 0 refills | Status: AC | PRN

## 2023-08-08 MED ORDER — OXCARBAZEPINE 300 MG PO TABS: 300.0000 mg | ORAL_TABLET | Freq: Two times a day (BID) | ORAL | 1 refills | Status: AC

## 2023-08-08 MED ORDER — METHIMAZOLE 10 MG PO TABS: 10.0000 mg | ORAL_TABLET | Freq: Every day | ORAL | 1 refills | Status: DC

## 2023-08-08 MED ORDER — APIXABAN 5 MG PO TABS: 5.0000 mg | ORAL_TABLET | Freq: Two times a day (BID) | ORAL | 1 refills | Status: DC

## 2023-08-08 MED ORDER — PANTOPRAZOLE SODIUM 40 MG PO TBEC: 40.0000 mg | DELAYED_RELEASE_TABLET | Freq: Every day | ORAL | 1 refills | Status: DC

## 2023-08-08 MED ORDER — ORAL CARE MOUTH RINSE: 15.0000 mL | OROMUCOSAL | 0 refills | Status: DC | PRN

## 2023-08-08 MED ORDER — SPIRONOLACTONE 25 MG PO TABS: 12.5000 mg | ORAL_TABLET | Freq: Every day | ORAL | 1 refills | Status: AC

## 2023-08-08 MED ORDER — COLCHICINE 0.6 MG PO TABS: 0.6000 mg | ORAL_TABLET | Freq: Every day | ORAL | 0 refills | Status: DC

## 2023-08-08 MED ORDER — NITROGLYCERIN 0.4 MG SL SUBL: 0.4000 mg | SUBLINGUAL_TABLET | SUBLINGUAL | 0 refills | Status: AC | PRN

## 2023-08-08 MED ORDER — IRBESARTAN 150 MG PO TABS: 150.0000 mg | ORAL_TABLET | Freq: Every day | ORAL | 1 refills | Status: AC

## 2023-08-08 NOTE — Progress Notes (Signed)
Report called to [facility] and given to [facility], LPN.  All questions answered.  Awaiting [facility] transport.  ETA = 4-5pm.

## 2023-08-08 NOTE — Plan of Care (Signed)
  Problem: Education: Goal: Knowledge of General Education information will improve Description: Including pain rating scale, medication(s)/side effects and non-pharmacologic comfort measures Outcome: Progressing   Problem: Health Behavior/Discharge Planning: Goal: Ability to manage health-related needs will improve Outcome: Progressing   Problem: Clinical Measurements: Goal: Ability to maintain clinical measurements within normal limits will improve Outcome: Progressing Goal: Will remain free from infection Outcome: Progressing Goal: Diagnostic test results will improve Outcome: Progressing Goal: Respiratory complications will improve Outcome: Progressing Goal: Cardiovascular complication will be avoided Outcome: Progressing   Problem: Activity: Goal: Risk for activity intolerance will decrease Outcome: Progressing   Problem: Nutrition: Goal: Adequate nutrition will be maintained Outcome: Progressing   Problem: Coping: Goal: Level of anxiety will decrease Outcome: Progressing   Problem: Elimination: Goal: Will not experience complications related to bowel motility Outcome: Progressing Goal: Will not experience complications related to urinary retention Outcome: Progressing   Problem: Pain Managment: Goal: General experience of comfort will improve Outcome: Progressing   Problem: Safety: Goal: Ability to remain free from injury will improve Outcome: Progressing   Problem: Skin Integrity: Goal: Risk for impaired skin integrity will decrease Outcome: Progressing   Problem: Activity: Goal: Ability to tolerate increased activity will improve Outcome: Progressing   Problem: Clinical Measurements: Goal: Ability to maintain a body temperature in the normal range will improve Outcome: Progressing   Problem: Respiratory: Goal: Ability to maintain adequate ventilation will improve Outcome: Progressing Goal: Ability to maintain a clear airway will improve Outcome:  Progressing   Problem: Education: Goal: Understanding of CV disease, CV risk reduction, and recovery process will improve Outcome: Progressing Goal: Individualized Educational Video(s) Outcome: Progressing   Problem: Activity: Goal: Ability to return to baseline activity level will improve Outcome: Progressing   Problem: Cardiovascular: Goal: Ability to achieve and maintain adequate cardiovascular perfusion will improve Outcome: Progressing Goal: Vascular access site(s) Level 0-1 will be maintained Outcome: Progressing   Problem: Health Behavior/Discharge Planning: Goal: Ability to safely manage health-related needs after discharge will improve Outcome: Progressing

## 2023-08-08 NOTE — Progress Notes (Signed)
Per pt. Spouse, the pt. Had an episode of choking followed by a moment of unresponsiveness.  By time I entered the room it was over.  I assessed her and found no changes with stable vitals.  Dartha Lodge, MD alerted

## 2023-08-08 NOTE — Discharge Summary (Addendum)
Physician Discharge Summary  Patient ID: Kristen Hansen MRN: [id number] DOB/AGE: 02-Feb-1946 77 y.o.  Admit date: 07/25/2023 Discharge date: 08/08/2023  Admission Diagnoses:  Discharge Diagnoses:  Principal Problem:   Pleural effusion Active Problems:   Rheumatoid arthritis (HCC)   HTN (hypertension)   Chronic bilateral low back pain without sciatica   Depression   Lewy body dementia without behavioral disturbance, psychotic disturbance, mood disturbance, or anxiety (HCC)   Spinal stenosis of lumbar region   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)   Chronic heart failure with preserved ejection fraction (HCC)   CKD (chronic kidney disease) stage 3, GFR 30-59 ml/min (HCC)   Gastroesophageal reflux disease with esophagitis   Mixed hyperlipidemia   Chest pain   Sjogren's syndrome (HCC)   Acute idiopathic pericarditis   PAF (paroxysmal atrial fibrillation) (HCC)   Need for management of chest tube   Discharged Condition: stable  Hospital Course: Patient is a 77 year old Caucasian female with past medical history significant for hypertension, hyperlipidemia, Lewy body dementia, Sjogren's syndrome, rheumatoid arthritis, chronic pain, DVT, spinal stenosis, diastolic CHF, depression, REM behavior disorder, orthostatic hypotension, CKD 3, GERD and hyperthyroidism.  Patient was admitted with chest pain.  Patient was admitted for further assessment and management.  Workup done revealed moderate pleural effusion.  Pulmonary/critical care team was consulted to assist with management of pleural effusion.  Workup revealed exudative effusion.  Cardiology team was consulted for chest pain.  Patient underwent cardiac catheterization that revealed clean coronaries.  Patient was found to have pericarditis.  Patient be discharged on colchicine 0.6 Mg p.o. once daily for the next 3 months.  Patient will follow-up with primary care provider, cardiology and pulmonary team on discharge.   During the hospital stay, patient was reported to be leaning towards one side.  MRI brain, CT head and EEG done today were nonrevealing.  Patient has been optimized.  Patient be discharged to skilled nursing facility for short-term rehab.  Continue aspiration precautions.  8  Chest Pain, exudative and recurrent right-sided pleural Effusion, Pericardial Effusion: -See above documentation. -Etiology uncertain.  Patient has history of rheumatoid arthritis and sjogren's syndrome.  Exudative effusion may be related to rheumatoid arthritis. -Patient underwent thoracentesis. -Given re-accumulation of fluid and exudative nature, pulmonary and critical care team was consulted and chest tube was placed.  Pulmonary and critical care team directed management of the chest tube. -Patient will follow-up with pulmonary team and rheumatology team on discharge. -Patient will be discharged back home on colchicine for the next 3 months for pericarditis.     Acute on chronic diastolic CHF EF 50 to 55%, and some preceding history of exertional shortness of breath:  -Patient is currently compensated. -Continue to manage expectantly. -Cardiac catheterization did not reveal any abnormalities in the coronaries. -Continue to optimize patient's volume.    Pericardial Effusion: -Patient has mild pericarditis -Patient will be discharged on colchicine. -Follow-up with cardiology on discharge.     Paroxysmal Atrial Fibrillation:  -Rate controlled.  Managed with amiodarone, heparin drip and metoprolol during the hospital stay.   -Heparin drip was later changed to DOAC. -Continue to monitor heart rate.  Dysphagia: -Aspiration precautions. 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     Lewy Body Dementia:  On pyridostigmine. Last saw neurologist one month ago.  -Appears to be progressing and pt may benefit from palliative care consult (see below).    Acute encephalopathy/delirium:  -Likely  multifactorial.  See above documentation. -Resolved.  GOC: Pt with lewey body dementia and multiple co-morbidities and worsening dysphagia.  Patient may benefit from outpatient palliative care follow-up.      Orthostatic Hypotension: Pt with orthostatic hypotension. Suspect this is secondary to her lewy body dementia.  Continue TED hose.   -Orthostatic precautions.     Hyponatremia:  -Likely multifactorial..   Hypomagnesemia/Hypokalemia:  -Monitored and repleted during the hospital stay. -Continue to monitor and replete.   Hx of DVT: -D dimer elevated,  CTA negative, lower extremity doppler which was negative for DVT.   -Discharged back home on DOAC.   Hyperthyroidism:  -Recently started on methimazole.  -Manage expectantly.   -Thyroid ultrasound revealed bilateral nodules that do not meet criteria for biopsy.  Follow-up closely. -Consider endocrinology follow-up on discharge (depending on goals of care).     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  -Conservative measures for now.   -Improved.   -Outpatient orthopedics follow-up postdischarge. PCP to arrange.   Hypertension:  -Continue to optimize. -Goal blood pressure should be less than 130/80 mmHg (if no symptoms).     GERD: -Continue PPI on discharge.   Sjogren's Syndrome:  -Not on any pharmacotherapy.   -Consider rheumatology follow-up on discharge.Marland Kitchen   RA: -Patient was on enbrel. Last used 2 months ago.    Chronic back pain: Tylenol prn   MDD: Zoloft 50 mg every day. Continued here.    Trigeminal Neuralgia:  -On lamictal and trileptal..    Code stroke/ ? TIA: Patient was noted to have right-sided weakness by RN at around 6:30 AM. LKN was at about 9:00 PM last night when RN administered her meds. The patient was drowsy and poorly cooperative at that time, which were attributed to  a recent Klonopin dose .  Code stroke was called, seen by neurology.  Underwent CT head, CTA head and neck and MRI brain and was ruled out of stroke.  EEG negative as well.  Patient back at her baseline mental status.  Neurology signed off. 08/06/2023: Patient noted to be leaning towards 1 side.  MRI brain, CT head and EEG all came back negative.    Consults: cardiology, pulmonary/intensive care, and neurology  Significant Diagnostic Studies:  MRI HEAD WITHOUT CONTRAST revealed:   TECHNIQUE: Multiplanar, multiecho pulse sequences of the brain and surrounding structures were obtained without intravenous contrast.   COMPARISON:  Brain MRI 07/31/2023.   FINDINGS: Brain: No acute infarct or hemorrhage. 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 No mass or midline shift. No hydrocephalus or extra-axial collection. Few foci of chronic hemosiderin deposition in the deep cerebral white matter.   Vascular: Normal flow voids.   Skull and upper cervical spine: Normal marrow signal.   Sinuses/Orbits: No acute findings.   Other: None.   IMPRESSION: 1. No acute intracranial process. 2. 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     Electronically Signed   By: Orvan Falconer M.D.   On: 08/06/2023 16:04    CT ANGIOGRAPHY HEAD AND NECK WITH AND WITHOUT CONTRAST revealed:   TECHNIQUE: Multidetector CT imaging of the head and neck was performed using the standard protocol during bolus administration of intravenous contrast. Multiplanar CT image reconstructions and MIPs were obtained to evaluate the vascular anatomy. Carotid stenosis measurements (when applicable) are obtained utilizing NASCET criteria, using  the distal internal carotid diameter as the denominator.   RADIATION DOSE REDUCTION: This exam was performed according to the departmental dose-optimization  program which includes automated exposure control, adjustment of the mA and/or kV according to patient size and/or use of iterative reconstruction technique.   CONTRAST:  60mL OMNIPAQUE IOHEXOL 350 MG/ML SOLN   COMPARISON:  Head CT 07/31/2023.   FINDINGS: CTA NECK FINDINGS   Aortic arch: Three-vessel arch configuration. Arch vessel origins are patent.   Right carotid system: No evidence of dissection, stenosis (50% or greater), or occlusion.   Left carotid system: No evidence of dissection, stenosis (50% or greater), or occlusion.   Vertebral arteries: Codominant. No evidence of dissection, stenosis (50% or greater), or occlusion.   Skeleton: Negative.   Other neck: Negative.   Upper chest: Partially imaged right pleural effusion with gas from chest tube placement, better evaluated on same day chest CT.   Review of the MIP images confirms the above findings   CTA HEAD FINDINGS   Anterior circulation: Calcified plaque along the carotid siphons without hemodynamically significant stenosis. The proximal ACAs and MCAs are patent without stenosis or aneurysm. Distal branches are symmetric.   Posterior circulation: Normal basilar artery. The SCAs, AICAs and PICAs are patent proximally. The PCAs are patent proximally without stenosis or aneurysm. Distal branches are symmetric.   Venous sinuses: Early phase of contrast.   Anatomic variants: Persistent fetal origin of the right PCA with hypoplastic right P1 segment.   Review of the MIP images confirms the above findings   IMPRESSION: 1. No large vessel occlusion, hemodynamically significant stenosis, or aneurysm in the head or neck. 2. Partially imaged right pleural effusion with gas from chest tube placement, better evaluated on same day chest CT.     Electronically Signed   By: Orvan Falconer M.D.   On: 07/31/2023 16:42   CT CHEST WITHOUT CONTRAST revealed:   TECHNIQUE: Multidetector CT imaging of the chest  was performed following the standard protocol without IV contrast.   RADIATION DOSE REDUCTION: This exam was performed according to the departmental dose-optimization program which includes automated exposure control, adjustment of the mA and/or kV according to patient size and/or use of iterative reconstruction technique.   COMPARISON:  07/25/2023.  Chest x-ray today.   FINDINGS: Cardiovascular: Heart is normal size. Coronary artery and aortic calcifications. Aorta normal caliber.   Mediastinum/Nodes: No mediastinal, hilar, or axillary adenopathy. Trachea and esophagus are unremarkable. Thyroid unremarkable.   Lungs/Pleura: Right pigtail pleural catheter in place with decreasing right pleural effusion. Small right pleural effusion remains with locules of gas throughout the pleural space. Bilateral lower lobe nodular ground-glass airspace opacities, right greater than left.   Upper Abdomen: Left hydronephrosis noted with contrast in the collecting system from prior CTA head/neck. There is a large left UPJ stone measuring 1.8 x 1.0 cm. Multiple mid and lower pole left renal stones.   Musculoskeletal: Bilateral breast implants, grossly unremarkable. No acute bony abnormality.   IMPRESSION: Decreasing right pleural effusion following placement of posterior pigtail pleural catheter. Small right pleural effusion and locules of pleural gas noted currently.   Patchy bilateral ground-glass airspace disease, right greater than left. Cannot exclude pneumonia.   1.8 x 1.0 cm left UPJ stone with left hydronephrosis.   Coronary artery disease.   Aortic Atherosclerosis (ICD10-I70.0).     Electronically Signed   By: Charlett Nose M.D.   On: 07/31/2023 17:03   CT HEAD WITHOUT CONTRAST revealed:   TECHNIQUE: Contiguous axial  images were obtained from the base of the skull through the vertex without intravenous contrast.   RADIATION DOSE REDUCTION: This exam was performed  according to the departmental dose-optimization program which includes automated exposure control, adjustment of the mA and/or kV according to patient size and/or use of iterative reconstruction technique.   COMPARISON:  Brain MRI 624.  Head CT 07/31/2023.   FINDINGS: Brain: No acute hemorrhage. Unchanged mild chronic small-vessel disease with old lacunar infarct in the right cerebellar hemisphere. Cortical gray-white differentiation is otherwise preserved. Prominence of the ventricles and sulci within expected range for age. No hydrocephalus or extra-axial collection. No mass effect or midline shift.   Vascular: No hyperdense vessel or unexpected calcification.   Skull: No calvarial fracture or suspicious bone lesion. Skull base is unremarkable.   Sinuses/Orbits: No acute finding.   Other: None.   IMPRESSION: 1. No acute intracranial abnormality. 2. Unchanged mild chronic small-vessel disease with old lacunar infarct in the right cerebellar hemisphere.     Electronically Signed   By: Orvan Falconer M.D.   On: 08/06/2023 15:58   Discharge Exam: Blood pressure (!) 157/78, pulse (!) 56, temperature 99.4 F (37.4 C), temperature source Oral, resp. rate 20, height 5\' 10"  (1.778 m), weight 56.1 kg, SpO2 96%.   Disposition: Discharge disposition: 03-Skilled Nursing Facility       Discharge Instructions     Diet - low sodium heart healthy   Complete by: As directed    Discharge wound care:   Complete by: As directed    Continue current wound care regimen.   Increase activity slowly   Complete by: As directed       Allergies as of 08/08/2023       Reactions   Morphine And Codeine Hives   Prevacid [lansoprazole] Other (See Comments)   unknown   Provigil [modafinil] Other (See Comments)   dizziness   Adhesive [tape] Rash   bandaids         Medication List     STOP taking these medications    acetaminophen-codeine 300-30 MG tablet Commonly known as:  TYLENOL #3   amLODipine-olmesartan 5-40 MG tablet Commonly known as: AZOR   benzonatate 100 MG capsule Commonly known as: TESSALON   clonazePAM 1 MG tablet Commonly known as: KLONOPIN   CO Q 10 PO   doxycycline 100 MG capsule Commonly known as: VIBRAMYCIN   Enbrel SureClick 50 MG/ML injection Generic drug: etanercept   ergocalciferol 1.25 MG (50000 UT) capsule Commonly known as: VITAMIN D2   famotidine 40 MG tablet Commonly known as: PEPCID   loratadine 10 MG tablet Commonly known as: CLARITIN   mometasone 50 MCG/ACT nasal spray Commonly known as: NASONEX   omeprazole 20 MG capsule Commonly known as: PRILOSEC Replaced by: pantoprazole 40 MG tablet   ondansetron 8 MG disintegrating tablet Commonly known as: Zofran ODT   STOOL SOFTENER PO       TAKE these medications    acetaminophen 325 MG tablet Commonly known as: TYLENOL Take 2 tablets (650 mg total) by mouth every 6 (six) hours as needed for mild pain (or Fever >/= 101).   amiodarone 200 MG tablet Commonly known as: PACERONE Take 1 tablet (200 mg total) by mouth daily. Start taking on: August 09, 2023   apixaban 5 MG Tabs tablet Commonly known as: ELIQUIS Take 1 tablet (5 mg total) by mouth 2 (two) times daily.   colchicine 0.6 MG tablet Take 1 tablet (0.6 mg total) by mouth daily.  Start taking on: August 09, 2023   irbesartan 150 MG tablet Commonly known as: AVAPRO Take 1 tablet (150 mg total) by mouth daily. Start taking on: August 09, 2023   lamoTRIgine 150 MG tablet Commonly known as: LAMICTAL Take 1 tablet by mouth once daily   methimazole 10 MG tablet Commonly known as: TAPAZOLE Take 1 tablet (10 mg total) by mouth daily. Start taking on: August 09, 2023   metoprolol tartrate 25 MG tablet Commonly known as: LOPRESSOR Take 1 tablet (25 mg total) by mouth 2 (two) times daily.   montelukast 10 MG tablet Commonly known as: SINGULAIR Take 10 mg by mouth at bedtime.   mouth rinse  Liqd solution 15 mLs by Mouth Rinse route as needed (oral care).   nitroGLYCERIN 0.4 MG SL tablet Commonly known as: NITROSTAT Place 1 tablet (0.4 mg total) under the tongue every 5 (five) minutes as needed for chest pain.   Oxcarbazepine 300 MG tablet Commonly known as: TRILEPTAL Take 1 tablet (300 mg total) by mouth 2 (two) times daily. What changed: See the new instructions.   pantoprazole 40 MG tablet Commonly known as: PROTONIX Take 1 tablet (40 mg total) by mouth daily. Start taking on: August 09, 2023 Replaces: omeprazole 20 MG capsule   polyethylene glycol 17 g packet Commonly known as: MIRALAX / GLYCOLAX Take 17 g by mouth daily as needed for mild constipation.   pravastatin 40 MG tablet Commonly known as: PRAVACHOL Take 40 mg by mouth daily before breakfast.   pyridostigmine 60 MG tablet Commonly known as: MESTINON Take 1 tablet (60 mg total) by mouth every 8 (eight) hours. What changed:  how much to take how to take this when to take this additional instructions   sertraline 50 MG tablet Commonly known as: ZOLOFT Take 1 tablet by mouth once daily   spironolactone 25 MG tablet Commonly known as: ALDACTONE Take 0.5 tablets (12.5 mg total) by mouth daily. Start taking on: August 09, 2023   valACYclovir 500 MG tablet Commonly known as: VALTREX Take 1,000 mg by mouth every 12 (twelve) hours as needed (for fever blisters).               Discharge Care Instructions  (From admission, onward)           Start     Ordered   08/08/23 0000  Discharge wound care:       Comments: Continue current wound care regimen.   08/08/23 1413            Contact information for follow-up providers     [facility] Follow up.   Specialty: Pulmonology Why: Make appointment if symptoms of pleural effusion (fluid around the lungs) recurs. Contact information: [address] [place]  [address] [phone]             Contact information for after-discharge care     Destination     [facility]erred [facility] .   Service: Skilled Nursing Contact information: [address] Test Update Address [place] [address] [phone]                     Time spent: 35 minutes.  SignedBarnetta Chapel 08/08/2023, 2:14 PM

## 2023-08-08 NOTE — TOC Transition Note (Signed)
Transition of Care [facility]) - CM/SW Discharge Note   Patient Details  Name: Kristen Hansen MRN: [id number] Date of Birth: 1946/08/09  [facility] CM/SW Contact:  Delilah Shan, LCSWA Phone Number: 08/08/2023, 2:54 PM   Clinical Narrative:     Patient will DC to: [facility]  Anticipated DC date: 08/08/2023  Family notified: [name]   Transport by: Sharin Mons  ?  Per MD patient ready for DC to [facility] . RN, patient, patient's family, and facility notified of DC. Discharge Summary sent to facility. RN given number for report [phone] RM# [phone]. DC packet on chart. Ambulance transport requested for patient.  CSW signing off.   Final next level of care: Skilled Nursing Facility Barriers to Discharge: No Barriers Identified   Patient Goals and CMS Choice CMS Medicare.gov Compare Post Acute Care list provided to:: Patient Represenative (must comment) (patients spouse) Choice offered to / list presented to : Spouse  Discharge Placement                  Patient to be transferred to facility by: [facility] Name of family member notified: [name] Patient and family notified of of transfer: 08/08/23  Discharge Plan and Services Additional resources added to the After Visit Summary for   In-house Referral: Clinical Social Work Discharge Planning Services: CM Consult              DME Agency: NA                  Social Determinants of Health (SDOH) Interventions SDOH Screenings   Food Insecurity: No Food Insecurity (07/25/2023)  Housing: Low Risk  (07/25/2023)  Transportation Needs: No Transportation Needs (07/25/2023)  Utilities: Not At Risk (07/25/2023)  Financial Resource Strain: Low Risk  (08/22/2022)   [facility] visits prior to 02/24/2023.  Physical Activity: Inactive (08/22/2022)   [facility], [facility] prior to 02/24/2023., [facility] visits prior to 02/24/2023.  Social Connections: Socially Integrated (08/22/2022)   [facility], [facility] prior to 02/24/2023., [facility] visits prior to 02/24/2023.  Stress: No Stress Concern Present (08/22/2022)   [facility] prior to 02/24/2023., [facility] visits prior to 02/24/2023.  Tobacco Use: Low Risk  (07/25/2023)     Readmission Risk Interventions     No data to display

## 2023-08-08 NOTE — Progress Notes (Signed)
Physical Therapy Treatment Patient Details Name: Kristen Hansen MRN: [id number] DOB: January 17, 1946 Today's Date: 08/08/2023   History of Present Illness Pt is 77 yo female presenting to [facility] ED with chest pain. Chest pain has been moving and is increased with palpation.  Pt underwent on 8/1 thoracentesis 1.3 L   and on 8/4 chest tube placement.   PMH: HTN, hyperlipidemia, Lewy body dementia, Sjogren's syndrome, rheumatoid arthritis, chronic pain, DVT, Spine disease, spinal stenosis, diastolic CHF, depression, REM behavior disorder, orthostatic Hypotension, CKD 3, GERD, hypothrydoidism.    PT Comments  Pt admitted with above diagnosis. Pt was able to ambulate with RW with better stability today.  No episodes with pt today and progressed distance.Still needs chair follow for safety.  Fatigues quickly and has poor safety as she fatigues.  Pt currently with functional limitations due to the deficits listed below (see PT Problem List). Pt will benefit from acute skilled PT to increase their independence and safety with mobility to allow discharge.       If plan is discharge home, recommend the following: A little help with walking and/or transfers;Assistance with cooking/housework;Assist for transportation;Help with stairs or ramp for entrance   Can travel by private vehicle        Equipment Recommendations  None recommended by PT    Recommendations for Other Services       Precautions / Restrictions Precautions Precautions: Fall Precaution Comments: watch BP Restrictions Weight Bearing Restrictions: No     Mobility  Bed Mobility Overal bed mobility: Needs Assistance Bed Mobility: Rolling Rolling: Contact guard assist   Supine to sit: Used rails, Contact guard     General bed mobility comments: use of rail and needed no assist for elevation of trunk    Transfers Overall transfer level: Needs assistance Equipment used: Rolling walker (2 wheels) Transfers: Sit to/from Stand Sit to  Stand: Min assist           General transfer comment: Pt needed min assist to power up to  RW with cues for hand placement.  Pt leaning posteriorly on heels needing  verbal a nd tactile cues to lean forward initially.  Pt needed up to min assist. Needed physical  assist and verbal cues to lean forward and stand tall with cues for sequencing.    Ambulation/Gait Ambulation/Gait assistance: +2 safety/equipment, Min assist Gait Distance (Feet): 180 Feet Assistive device: Rolling walker (2 wheels) Gait Pattern/deviations: Shuffle, Step-through pattern, Decreased stride length, Decreased step length - right, Decreased step length - left, Drifts right/left, Narrow base of support, Leaning posteriorly, Antalgic   Gait velocity interpretation: <1.31 ft/sec, indicative of household ambulator   General Gait Details: Pt min assist to progress ambulation today with chair follow (+2 assist). Pt needing assist for stability as well as assist to sequence steps and RW. Pt with incr stability today and no right lateral lean.  Sats good today however still question if pt holding breath with activity.   Stairs             Wheelchair Mobility     Tilt Bed    Modified Rankin (Stroke Patients Only)       Balance Overall balance assessment: Needs assistance Sitting-balance support: No upper extremity supported, Feet supported Sitting balance-Leahy Scale: Fair     Standing balance support: Bilateral upper extremity supported Standing balance-Leahy Scale: Poor Standing balance comment: relies on RW with posterior lean onto heels  Cognition Arousal: Alert Behavior During Therapy: Flat affect Overall Cognitive Status: History of cognitive impairments - at baseline                                          Exercises General Exercises - Lower Extremity Ankle Circles/Pumps: AAROM, 5 reps Long Arc Quad: AAROM, 10 reps Heel Slides:  AAROM, 10 reps Straight Leg Raises: AAROM, 10 reps Hip Flexion/Marching: AAROM, 10 reps    General Comments        Pertinent Vitals/Pain Pain Assessment Pain Assessment: No/denies pain    Home Living                          Prior Function            PT Goals (current goals can now be found in the care plan section) Acute Rehab PT Goals Patient Stated Goal: none reported Progress towards PT goals: Progressing toward goals    Frequency    Min 1X/week      PT Plan Current plan remains appropriate    Co-evaluation              AM-PAC PT "6 Clicks" Mobility   Outcome Measure  Help needed turning from your back to your side while in a flat bed without using bedrails?: A Lot Help needed moving from lying on your back to sitting on the side of a flat bed without using bedrails?: A Lot Help needed moving to and from a bed to a chair (including a wheelchair)?: Total Help needed standing up from a chair using your arms (e.g., wheelchair or bedside chair)?: Total Help needed to walk in hospital room?: Total Help needed climbing 3-5 steps with a railing? : Total 6 Click Score: 8    End of Session Equipment Utilized During Treatment: Gait belt Activity Tolerance: Patient limited by fatigue Patient left: with call bell/phone within reach;with family/visitor present;in chair;with chair alarm set Nurse Communication: Mobility status PT Visit Diagnosis: Unsteadiness on feet (R26.81);Other abnormalities of gait and mobility (R26.89)     Time: 1056-1130 PT Time Calculation (min) (ACUTE ONLY): 34 min  Charges:    $Gait Training: 8-22 mins $Therapeutic Exercise: 8-22 mins PT General Charges $$ ACUTE PT VISIT: 1 Visit                      M,PT Acute Rehab Services [phone]    Kristen Hansen 08/08/2023, 12:23 PM

## 2023-08-08 NOTE — TOC Progression Note (Addendum)
Transition of Care [facility]) - Progression Note    Patient Details  Name: Kristen Hansen MRN: [id number] Date of Birth: 10/06/1946  Transition of Care [facility]) CM/SW Contact  Delilah Shan, LCSWA Phone Number: 08/08/2023, 12:02 PM  Clinical Narrative:     CSW spoke with Brittney with [facility] who confirmed patient can dc over when medically ready for dc. CSW informed MD.CSW will continue to follow and assist with patients dc planning needs.   Expected Discharge Plan: Skilled Nursing Facility Barriers to Discharge: Continued Medical Work up  Expected Discharge Plan and Services In-house Referral: Clinical Social Work Discharge Planning Services: CM Consult   Living arrangements for the past 2 months: Single Family Home                   DME Agency: NA                   Social Determinants of Health (SDOH) Interventions SDOH Screenings   Food Insecurity: No Food Insecurity (07/25/2023)  Housing: Low Risk  (07/25/2023)  Transportation Needs: No Transportation Needs (07/25/2023)  Utilities: Not At Risk (07/25/2023)  Financial Resource Strain: Low Risk  (08/22/2022)   [facility], [facility] prior to 02/24/2023[facility] prior to 02/24/2023.  Physical Activity: Inactive (08/22/2022)   [facility], [facility] prior to 02/24/2023., [facility] visits prior to 02/24/2023.  Social Connections: Socially Integrated (08/22/2022)   [facility], [facility] prior to 02/24/2023., [facility] visits prior to 02/24/2023.  Stress: No Stress Concern Present (08/22/2022)   [facility] prior to 02/24/2023., [facility] visits prior to 02/24/2023.  Tobacco Use: Low  Risk  (07/25/2023)    Readmission Risk Interventions     No data to display

## 2023-08-09 NOTE — Progress Notes (Signed)
[facility] Liaison Note  Received request for outpatient palliative care services at discharge.  Noted that patient discharging 8.14.2024.  Please call with any outpatient palliative care questions.  Thank you for the opportunity to participate in this patients care.  Thank you, Haynes Bast, BSN, [facility] [phone]

## 2023-08-10 DIAGNOSIS — K2100 Gastro-esophageal reflux disease with esophagitis, without bleeding: Secondary | ICD-10-CM

## 2023-08-10 DIAGNOSIS — I480 Paroxysmal atrial fibrillation: Secondary | ICD-10-CM | POA: Diagnosis not present

## 2023-08-10 DIAGNOSIS — E039 Hypothyroidism, unspecified: Secondary | ICD-10-CM | POA: Diagnosis not present

## 2023-08-10 DIAGNOSIS — F32A Depression, unspecified: Secondary | ICD-10-CM

## 2023-08-10 DIAGNOSIS — R1312 Dysphagia, oropharyngeal phase: Secondary | ICD-10-CM | POA: Diagnosis not present

## 2023-08-10 DIAGNOSIS — M069 Rheumatoid arthritis, unspecified: Secondary | ICD-10-CM

## 2023-08-10 DIAGNOSIS — J90 Pleural effusion, not elsewhere classified: Secondary | ICD-10-CM | POA: Diagnosis not present

## 2023-08-14 DIAGNOSIS — I951 Orthostatic hypotension: Secondary | ICD-10-CM

## 2023-08-14 DIAGNOSIS — E039 Hypothyroidism, unspecified: Secondary | ICD-10-CM | POA: Diagnosis not present

## 2023-08-14 DIAGNOSIS — R1312 Dysphagia, oropharyngeal phase: Secondary | ICD-10-CM | POA: Diagnosis not present

## 2023-08-14 DIAGNOSIS — M3500 Sicca syndrome, unspecified: Secondary | ICD-10-CM

## 2023-08-14 DIAGNOSIS — E782 Mixed hyperlipidemia: Secondary | ICD-10-CM

## 2023-08-14 DIAGNOSIS — J90 Pleural effusion, not elsewhere classified: Secondary | ICD-10-CM | POA: Diagnosis not present

## 2023-08-14 DIAGNOSIS — I480 Paroxysmal atrial fibrillation: Secondary | ICD-10-CM | POA: Diagnosis not present

## 2023-08-15 DIAGNOSIS — J90 Pleural effusion, not elsewhere classified: Secondary | ICD-10-CM | POA: Diagnosis not present

## 2023-08-15 DIAGNOSIS — R1312 Dysphagia, oropharyngeal phase: Secondary | ICD-10-CM | POA: Diagnosis not present

## 2023-08-15 DIAGNOSIS — I480 Paroxysmal atrial fibrillation: Secondary | ICD-10-CM | POA: Diagnosis not present

## 2023-08-15 DIAGNOSIS — E782 Mixed hyperlipidemia: Secondary | ICD-10-CM

## 2023-08-15 DIAGNOSIS — F32A Depression, unspecified: Secondary | ICD-10-CM

## 2023-08-15 DIAGNOSIS — M3500 Sicca syndrome, unspecified: Secondary | ICD-10-CM

## 2023-08-15 DIAGNOSIS — K2100 Gastro-esophageal reflux disease with esophagitis, without bleeding: Secondary | ICD-10-CM

## 2023-08-15 DIAGNOSIS — E039 Hypothyroidism, unspecified: Secondary | ICD-10-CM | POA: Diagnosis not present

## 2023-08-17 DIAGNOSIS — R1312 Dysphagia, oropharyngeal phase: Secondary | ICD-10-CM | POA: Diagnosis not present

## 2023-08-17 DIAGNOSIS — E871 Hypo-osmolality and hyponatremia: Secondary | ICD-10-CM

## 2023-08-17 DIAGNOSIS — I10 Essential (primary) hypertension: Secondary | ICD-10-CM

## 2023-08-17 DIAGNOSIS — E039 Hypothyroidism, unspecified: Secondary | ICD-10-CM | POA: Diagnosis not present

## 2023-08-17 DIAGNOSIS — J90 Pleural effusion, not elsewhere classified: Secondary | ICD-10-CM | POA: Diagnosis not present

## 2023-08-17 DIAGNOSIS — I480 Paroxysmal atrial fibrillation: Secondary | ICD-10-CM | POA: Diagnosis not present

## 2023-08-21 DIAGNOSIS — I480 Paroxysmal atrial fibrillation: Secondary | ICD-10-CM | POA: Diagnosis not present

## 2023-08-21 DIAGNOSIS — N390 Urinary tract infection, site not specified: Secondary | ICD-10-CM | POA: Diagnosis not present

## 2023-08-21 DIAGNOSIS — M6281 Muscle weakness (generalized): Secondary | ICD-10-CM | POA: Diagnosis not present

## 2023-08-21 DIAGNOSIS — M069 Rheumatoid arthritis, unspecified: Secondary | ICD-10-CM | POA: Diagnosis not present

## 2023-08-21 DIAGNOSIS — E871 Hypo-osmolality and hyponatremia: Secondary | ICD-10-CM

## 2023-08-21 DIAGNOSIS — E039 Hypothyroidism, unspecified: Secondary | ICD-10-CM | POA: Diagnosis not present

## 2023-08-21 DIAGNOSIS — I951 Orthostatic hypotension: Secondary | ICD-10-CM

## 2023-08-21 DIAGNOSIS — R400 Somnolence: Secondary | ICD-10-CM

## 2023-08-21 DIAGNOSIS — H81399 Other peripheral vertigo, unspecified ear: Secondary | ICD-10-CM | POA: Diagnosis not present

## 2023-08-21 DIAGNOSIS — J90 Pleural effusion, not elsewhere classified: Secondary | ICD-10-CM | POA: Diagnosis not present

## 2023-08-29 ENCOUNTER — Emergency Department (HOSPITAL_COMMUNITY): Payer: Medicare PPO

## 2023-08-29 ENCOUNTER — Inpatient Hospital Stay (HOSPITAL_COMMUNITY)
Admission: EM | Admit: 2023-08-29 | Discharge: 2023-09-03 | DRG: 659 | Disposition: A | Discharge status: Skilled Nursing Facility | Payer: Medicare PPO | Attending: Internal Medicine | Admitting: Internal Medicine

## 2023-08-29 ENCOUNTER — Other Ambulatory Visit: Payer: Self-pay

## 2023-08-29 DIAGNOSIS — F32A Depression, unspecified: Secondary | ICD-10-CM | POA: Diagnosis present

## 2023-08-29 DIAGNOSIS — M48061 Spinal stenosis, lumbar region without neurogenic claudication: Secondary | ICD-10-CM | POA: Diagnosis present

## 2023-08-29 DIAGNOSIS — Z1611 Resistance to penicillins: Secondary | ICD-10-CM | POA: Diagnosis present

## 2023-08-29 DIAGNOSIS — B9620 Unspecified Escherichia coli [E. coli] as the cause of diseases classified elsewhere: Secondary | ICD-10-CM | POA: Diagnosis present

## 2023-08-29 DIAGNOSIS — M419 Scoliosis, unspecified: Secondary | ICD-10-CM | POA: Diagnosis present

## 2023-08-29 DIAGNOSIS — Z1629 Resistance to other single specified antibiotic: Secondary | ICD-10-CM | POA: Diagnosis present

## 2023-08-29 DIAGNOSIS — Z86718 Personal history of other venous thrombosis and embolism: Secondary | ICD-10-CM

## 2023-08-29 DIAGNOSIS — Z91048 Other nonmedicinal substance allergy status: Secondary | ICD-10-CM

## 2023-08-29 DIAGNOSIS — N3001 Acute cystitis with hematuria: Secondary | ICD-10-CM | POA: Diagnosis not present

## 2023-08-29 DIAGNOSIS — Z1152 Encounter for screening for COVID-19: Secondary | ICD-10-CM | POA: Diagnosis not present

## 2023-08-29 DIAGNOSIS — I5032 Chronic diastolic (congestive) heart failure: Secondary | ICD-10-CM | POA: Diagnosis present

## 2023-08-29 DIAGNOSIS — I050 Rheumatic mitral stenosis: Secondary | ICD-10-CM | POA: Diagnosis present

## 2023-08-29 DIAGNOSIS — R1310 Dysphagia, unspecified: Secondary | ICD-10-CM | POA: Diagnosis present

## 2023-08-29 DIAGNOSIS — E7800 Pure hypercholesterolemia, unspecified: Secondary | ICD-10-CM | POA: Diagnosis present

## 2023-08-29 DIAGNOSIS — N136 Pyonephrosis: Secondary | ICD-10-CM | POA: Diagnosis present

## 2023-08-29 DIAGNOSIS — R319 Hematuria, unspecified: Secondary | ICD-10-CM | POA: Diagnosis not present

## 2023-08-29 DIAGNOSIS — E039 Hypothyroidism, unspecified: Secondary | ICD-10-CM

## 2023-08-29 DIAGNOSIS — Z66 Do not resuscitate: Secondary | ICD-10-CM | POA: Diagnosis present

## 2023-08-29 DIAGNOSIS — R636 Underweight: Secondary | ICD-10-CM | POA: Diagnosis present

## 2023-08-29 DIAGNOSIS — N182 Chronic kidney disease, stage 2 (mild): Secondary | ICD-10-CM | POA: Diagnosis present

## 2023-08-29 DIAGNOSIS — R7881 Bacteremia: Secondary | ICD-10-CM | POA: Diagnosis present

## 2023-08-29 DIAGNOSIS — Z96651 Presence of right artificial knee joint: Secondary | ICD-10-CM | POA: Diagnosis present

## 2023-08-29 DIAGNOSIS — Z79899 Other long term (current) drug therapy: Secondary | ICD-10-CM

## 2023-08-29 DIAGNOSIS — M069 Rheumatoid arthritis, unspecified: Secondary | ICD-10-CM | POA: Diagnosis present

## 2023-08-29 DIAGNOSIS — I480 Paroxysmal atrial fibrillation: Secondary | ICD-10-CM | POA: Diagnosis not present

## 2023-08-29 DIAGNOSIS — G8929 Other chronic pain: Secondary | ICD-10-CM | POA: Diagnosis present

## 2023-08-29 DIAGNOSIS — Z90710 Acquired absence of both cervix and uterus: Secondary | ICD-10-CM

## 2023-08-29 DIAGNOSIS — I82539 Chronic embolism and thrombosis of unspecified popliteal vein: Secondary | ICD-10-CM | POA: Diagnosis present

## 2023-08-29 DIAGNOSIS — R54 Age-related physical debility: Secondary | ICD-10-CM | POA: Diagnosis present

## 2023-08-29 DIAGNOSIS — Z820 Family history of epilepsy and other diseases of the nervous system: Secondary | ICD-10-CM

## 2023-08-29 DIAGNOSIS — G500 Trigeminal neuralgia: Secondary | ICD-10-CM | POA: Diagnosis present

## 2023-08-29 DIAGNOSIS — M1990 Unspecified osteoarthritis, unspecified site: Secondary | ICD-10-CM | POA: Diagnosis present

## 2023-08-29 DIAGNOSIS — I130 Hypertensive heart and chronic kidney disease with heart failure and stage 1 through stage 4 chronic kidney disease, or unspecified chronic kidney disease: Secondary | ICD-10-CM | POA: Diagnosis present

## 2023-08-29 DIAGNOSIS — E0590 Thyrotoxicosis, unspecified without thyrotoxic crisis or storm: Secondary | ICD-10-CM | POA: Diagnosis present

## 2023-08-29 DIAGNOSIS — Z7901 Long term (current) use of anticoagulants: Secondary | ICD-10-CM

## 2023-08-29 DIAGNOSIS — Z23 Encounter for immunization: Secondary | ICD-10-CM | POA: Diagnosis present

## 2023-08-29 DIAGNOSIS — M6281 Muscle weakness (generalized): Secondary | ICD-10-CM | POA: Diagnosis not present

## 2023-08-29 DIAGNOSIS — N200 Calculus of kidney: Secondary | ICD-10-CM

## 2023-08-29 DIAGNOSIS — G9341 Metabolic encephalopathy: Secondary | ICD-10-CM | POA: Diagnosis present

## 2023-08-29 DIAGNOSIS — F0283 Dementia in other diseases classified elsewhere, unspecified severity, with mood disturbance: Secondary | ICD-10-CM | POA: Diagnosis present

## 2023-08-29 DIAGNOSIS — M3500 Sicca syndrome, unspecified: Secondary | ICD-10-CM | POA: Diagnosis present

## 2023-08-29 DIAGNOSIS — R4182 Altered mental status, unspecified: Secondary | ICD-10-CM | POA: Diagnosis present

## 2023-08-29 DIAGNOSIS — G3183 Dementia with Lewy bodies: Secondary | ICD-10-CM | POA: Diagnosis present

## 2023-08-29 DIAGNOSIS — Z888 Allergy status to other drugs, medicaments and biological substances status: Secondary | ICD-10-CM

## 2023-08-29 DIAGNOSIS — Z96641 Presence of right artificial hip joint: Secondary | ICD-10-CM | POA: Diagnosis present

## 2023-08-29 DIAGNOSIS — Z8249 Family history of ischemic heart disease and other diseases of the circulatory system: Secondary | ICD-10-CM

## 2023-08-29 DIAGNOSIS — E876 Hypokalemia: Secondary | ICD-10-CM | POA: Diagnosis not present

## 2023-08-29 DIAGNOSIS — N390 Urinary tract infection, site not specified: Secondary | ICD-10-CM | POA: Diagnosis present

## 2023-08-29 DIAGNOSIS — N183 Chronic kidney disease, stage 3 unspecified: Secondary | ICD-10-CM | POA: Diagnosis present

## 2023-08-29 DIAGNOSIS — N10 Acute tubulo-interstitial nephritis: Secondary | ICD-10-CM | POA: Diagnosis not present

## 2023-08-29 DIAGNOSIS — R1312 Dysphagia, oropharyngeal phase: Secondary | ICD-10-CM | POA: Diagnosis not present

## 2023-08-29 DIAGNOSIS — I951 Orthostatic hypotension: Secondary | ICD-10-CM | POA: Diagnosis present

## 2023-08-29 DIAGNOSIS — Z885 Allergy status to narcotic agent status: Secondary | ICD-10-CM

## 2023-08-29 DIAGNOSIS — N201 Calculus of ureter: Secondary | ICD-10-CM | POA: Diagnosis not present

## 2023-08-29 DIAGNOSIS — Z681 Body mass index (BMI) 19 or less, adult: Secondary | ICD-10-CM

## 2023-08-29 DIAGNOSIS — I10 Essential (primary) hypertension: Secondary | ICD-10-CM | POA: Diagnosis present

## 2023-08-29 DIAGNOSIS — Z7189 Other specified counseling: Secondary | ICD-10-CM | POA: Diagnosis not present

## 2023-08-29 DIAGNOSIS — N3000 Acute cystitis without hematuria: Secondary | ICD-10-CM

## 2023-08-29 DIAGNOSIS — J90 Pleural effusion, not elsewhere classified: Secondary | ICD-10-CM | POA: Diagnosis not present

## 2023-08-29 DIAGNOSIS — K219 Gastro-esophageal reflux disease without esophagitis: Secondary | ICD-10-CM | POA: Diagnosis present

## 2023-08-29 DIAGNOSIS — Z515 Encounter for palliative care: Secondary | ICD-10-CM | POA: Diagnosis not present

## 2023-08-29 LAB — COMPREHENSIVE METABOLIC PANEL
ALT: 25 U/L (ref 0–44)
AST: 24 U/L (ref 15–41)
Albumin: 2.2 g/dL — ABNORMAL LOW (ref 3.5–5.0)
Alkaline Phosphatase: 120 U/L (ref 38–126)
Anion gap: 13 (ref 5–15)
BUN: 20 mg/dL (ref 8–23)
CO2: 19 mmol/L — ABNORMAL LOW (ref 22–32)
Calcium: 8.2 mg/dL — ABNORMAL LOW (ref 8.9–10.3)
Chloride: 97 mmol/L — ABNORMAL LOW (ref 98–111)
Creatinine, Ser: 0.98 mg/dL (ref 0.44–1.00)
GFR, Estimated: 60 mL/min — ABNORMAL LOW (ref >60)
Glucose, Bld: 109 mg/dL — ABNORMAL HIGH (ref 70–99)
Potassium: 3.9 mmol/L (ref 3.5–5.1)
Sodium: 129 mmol/L — ABNORMAL LOW (ref 135–145)
Total Bilirubin: 0.6 mg/dL (ref 0.3–1.2)
Total Protein: 5.2 g/dL — ABNORMAL LOW (ref 6.5–8.1)

## 2023-08-29 LAB — TROPONIN I (HIGH SENSITIVITY)
Troponin I (High Sensitivity): 10 ng/L (ref <18)
Troponin I (High Sensitivity): 12 ng/L (ref <18)

## 2023-08-29 LAB — URINALYSIS, W/ REFLEX TO CULTURE (INFECTION SUSPECTED)
Bilirubin Urine: NEGATIVE
Glucose, UA: NEGATIVE mg/dL
Ketones, ur: NEGATIVE mg/dL
Nitrite: NEGATIVE
Protein, ur: 100 mg/dL — AB
RBC / HPF: >50 RBC/hpf (ref 0–5)
Specific Gravity, Urine: 1.014 (ref 1.005–1.030)
WBC, UA: >50 WBC/hpf (ref 0–5)
pH: 5 (ref 5.0–8.0)

## 2023-08-29 LAB — CBC WITH DIFFERENTIAL/PLATELET
Abs Immature Granulocytes: 0.04 10*3/uL (ref 0.00–0.07)
Basophils Absolute: 0 10*3/uL (ref 0.0–0.1)
Basophils Relative: 0 %
Eosinophils Absolute: 0 10*3/uL (ref 0.0–0.5)
Eosinophils Relative: 1 %
HCT: 35.1 % — ABNORMAL LOW (ref 36.0–46.0)
Hemoglobin: 11 g/dL — ABNORMAL LOW (ref 12.0–15.0)
Immature Granulocytes: 1 %
Lymphocytes Relative: 14 %
Lymphs Abs: 1 10*3/uL (ref 0.7–4.0)
MCH: 26.6 pg (ref 26.0–34.0)
MCHC: 31.3 g/dL (ref 30.0–36.0)
MCV: 85 fL (ref 80.0–100.0)
Monocytes Absolute: 0.5 10*3/uL (ref 0.1–1.0)
Monocytes Relative: 7 %
Neutro Abs: 5.2 10*3/uL (ref 1.7–7.7)
Neutrophils Relative %: 77 %
Platelets: 261 10*3/uL (ref 150–400)
RBC: 4.13 MIL/uL (ref 3.87–5.11)
RDW: 16.3 % — ABNORMAL HIGH (ref 11.5–15.5)
WBC: 6.7 10*3/uL (ref 4.0–10.5)
nRBC: 0 % (ref 0.0–0.2)

## 2023-08-29 LAB — SARS CORONAVIRUS 2 BY RT PCR: SARS Coronavirus 2 by RT PCR: NEGATIVE

## 2023-08-29 LAB — LACTIC ACID, PLASMA: Lactic Acid, Venous: 1.2 mmol/L (ref 0.5–1.9)

## 2023-08-29 LAB — TSH: TSH: 6.326 u[IU]/mL — ABNORMAL HIGH (ref 0.350–4.500)

## 2023-08-29 MED ORDER — OXCARBAZEPINE 300 MG PO TABS
300.0000 mg | ORAL_TABLET | Freq: Two times a day (BID) | ORAL | Status: DC
  Administered 2023-08-30 – 2023-09-03 (×10): 300 mg via ORAL
  Filled 2023-08-29 (×11): qty 1

## 2023-08-29 MED ORDER — AMIODARONE HCL 200 MG PO TABS
200.0000 mg | ORAL_TABLET | Freq: Every day | ORAL | Status: DC
  Administered 2023-08-30 – 2023-09-03 (×5): 200 mg via ORAL
  Filled 2023-08-29 (×5): qty 1

## 2023-08-29 MED ORDER — IRBESARTAN 300 MG PO TABS
150.0000 mg | ORAL_TABLET | Freq: Every day | ORAL | Status: DC
  Administered 2023-08-30 – 2023-09-03 (×5): 150 mg via ORAL
  Filled 2023-08-29 (×5): qty 1

## 2023-08-29 MED ORDER — SPIRONOLACTONE 12.5 MG HALF TABLET
12.5000 mg | ORAL_TABLET | Freq: Every day | ORAL | Status: DC
  Administered 2023-08-30 – 2023-09-03 (×5): 12.5 mg via ORAL
  Filled 2023-08-29 (×5): qty 1

## 2023-08-29 MED ORDER — METOPROLOL TARTRATE 25 MG PO TABS
25.0000 mg | ORAL_TABLET | Freq: Two times a day (BID) | ORAL | Status: DC
  Administered 2023-08-30 – 2023-09-03 (×10): 25 mg via ORAL
  Filled 2023-08-29 (×10): qty 1

## 2023-08-29 MED ORDER — LAMOTRIGINE 150 MG PO TABS
150.0000 mg | ORAL_TABLET | Freq: Every day | ORAL | Status: DC
  Administered 2023-08-30 – 2023-09-03 (×5): 150 mg via ORAL
  Filled 2023-08-29: qty 6
  Filled 2023-08-29 (×5): qty 1

## 2023-08-29 MED ORDER — PYRIDOSTIGMINE BROMIDE 60 MG PO TABS
60.0000 mg | ORAL_TABLET | Freq: Three times a day (TID) | ORAL | Status: DC
  Administered 2023-08-30 – 2023-09-03 (×14): 60 mg via ORAL
  Filled 2023-08-29 (×14): qty 1

## 2023-08-29 MED ORDER — SODIUM CHLORIDE 0.9 % IV SOLN: 1.0000 g | INTRAVENOUS | Status: DC

## 2023-08-29 MED ORDER — PRAVASTATIN SODIUM 40 MG PO TABS
40.0000 mg | ORAL_TABLET | Freq: Every day | ORAL | Status: DC
  Administered 2023-08-30 – 2023-09-03 (×5): 40 mg via ORAL
  Filled 2023-08-29 (×5): qty 1

## 2023-08-29 MED ORDER — MONTELUKAST SODIUM 10 MG PO TABS
10.0000 mg | ORAL_TABLET | Freq: Every day | ORAL | Status: DC
  Administered 2023-08-30 – 2023-09-02 (×5): 10 mg via ORAL
  Filled 2023-08-29 (×5): qty 1

## 2023-08-29 MED ORDER — COLCHICINE 0.6 MG PO TABS
0.6000 mg | ORAL_TABLET | Freq: Every day | ORAL | Status: DC
  Administered 2023-08-30 – 2023-09-03 (×5): 0.6 mg via ORAL
  Filled 2023-08-29 (×5): qty 1

## 2023-08-29 MED ORDER — SODIUM CHLORIDE 0.9 % IV SOLN
1.0000 g | Freq: Once | INTRAVENOUS | Status: AC
  Administered 2023-08-29: 1 g via INTRAVENOUS
  Filled 2023-08-29: qty 10

## 2023-08-29 MED ORDER — APIXABAN 5 MG PO TABS
5.0000 mg | ORAL_TABLET | Freq: Two times a day (BID) | ORAL | Status: DC
  Administered 2023-08-30 – 2023-08-31 (×4): 5 mg via ORAL
  Filled 2023-08-29 (×5): qty 1

## 2023-08-29 MED ORDER — ALBUTEROL SULFATE (2.5 MG/3ML) 0.083% IN NEBU
2.5000 mg | INHALATION_SOLUTION | Freq: Four times a day (QID) | RESPIRATORY_TRACT | Status: DC
  Filled 2023-08-29: qty 3

## 2023-08-29 MED ORDER — POLYETHYLENE GLYCOL 3350 17 G PO PACK: 17.0000 g | PACK | Freq: Every day | ORAL | Status: DC | PRN

## 2023-08-29 MED ORDER — ONDANSETRON HCL 4 MG PO TABS: 4.0000 mg | ORAL_TABLET | Freq: Four times a day (QID) | ORAL | Status: DC | PRN

## 2023-08-29 MED ORDER — ACETAMINOPHEN 325 MG PO TABS
650.0000 mg | ORAL_TABLET | Freq: Four times a day (QID) | ORAL | Status: DC | PRN
  Administered 2023-08-30 – 2023-09-01 (×2): 650 mg via ORAL
  Filled 2023-08-29 (×2): qty 2

## 2023-08-29 MED ORDER — MECLIZINE HCL 12.5 MG PO TABS: 12.5000 mg | ORAL_TABLET | Freq: Three times a day (TID) | ORAL | Status: DC | PRN

## 2023-08-29 MED ORDER — SERTRALINE HCL 50 MG PO TABS
50.0000 mg | ORAL_TABLET | Freq: Every day | ORAL | Status: DC
  Administered 2023-08-30 – 2023-09-03 (×5): 50 mg via ORAL
  Filled 2023-08-29 (×5): qty 1

## 2023-08-29 MED ORDER — ONDANSETRON HCL 4 MG/2ML IJ SOLN: 4.0000 mg | Freq: Four times a day (QID) | INTRAMUSCULAR | Status: DC | PRN

## 2023-08-29 MED ORDER — PANTOPRAZOLE SODIUM 40 MG PO TBEC
40.0000 mg | DELAYED_RELEASE_TABLET | Freq: Every day | ORAL | Status: DC
  Administered 2023-08-30 – 2023-09-03 (×5): 40 mg via ORAL
  Filled 2023-08-29 (×5): qty 1

## 2023-08-29 MED ORDER — LEVALBUTEROL HCL 0.63 MG/3ML IN NEBU: 0.6300 mg | INHALATION_SOLUTION | Freq: Four times a day (QID) | RESPIRATORY_TRACT | Status: DC | PRN

## 2023-08-29 NOTE — ED Notes (Signed)
ED TO INPATIENT HANDOFF REPORT  ED Nurse Name and Phone #: [phone]  S Name/Age/Gender Kristen Hansen 77 y.o. female Room/Bed: 028C/028C  Code Status   Code Status: Prior  Home/SNF/Other Skilled nursing facility Patient oriented to: self and place Is this baseline? Yes   Triage Complete: Triage complete  Chief Complaint UTI (urinary tract infection) [N39.0]  Triage Note Pt to the ed from [facility]. Per ems nursing staff relay spt has a kidney stone and has had a mental status change today. Staff was unable to clarify what the change was in mental status. Pt has dementia at base line and is A&Ox1. Pt does not have any pain or complaints at this time. Will contact family for more information.    Allergies Allergies  Allergen Reactions   Morphine And Codeine Hives   Prevacid [Lansoprazole] Other (See Comments)    unknown   Provigil [Modafinil] Other (See Comments)    dizziness   Adhesive [Tape] Rash    bandaids     Level of Care/Admitting Diagnosis ED Disposition     ED Disposition  Admit   Condition  --   Comment  Hospital Area: [facility] [[id number]]  Level of Care: Telemetry Medical [104]  May admit patient to [facility] or [facility] if equivalent level of care is available:: No  Covid Evaluation: Asymptomatic - no recent exposure (last 10 days) testing not required  Diagnosis: UTI (urinary tract infection) [865784]  Admitting Physician: Lurline Del [[id number]]  Attending Physician: Lurline Del [[id number]]  Certification:: I certify this patient will need inpatient services for at least 2 midnights  Expected Medical Readiness: 08/31/2023          B Medical/Surgery History Past Medical History:  Diagnosis Date   Back pain    Cancer (HCC)    Skin, basal cell   Dyspnea    Fatigue    GERD (gastroesophageal reflux disease)    Headache(784.0)    history of migraines   History of kidney stones    Hypercholesteremia     Hypertension    Leg swelling    Bilateral   Occasional tremors    mild hand tremors   Osteoarthritis    PONV (postoperative nausea and vomiting)    RBD (REM behavioral disorder)    Rheumatoid arthritis(714.0)    Scoliosis of lumbar spine    Spinal stenosis    Multiple levels   Tic [place]    Trigeminal neuralgia 03/30/2015   Vision abnormalities    Past Surgical History:  Procedure Laterality Date   ABDOMINAL HYSTERECTOMY     AUGMENTATION MAMMAPLASTY Bilateral    COLONOSCOPY     IR THORACENTESIS ASP PLEURAL SPACE W/IMG GUIDE  07/26/2023   JOINT REPLACEMENT     right uni knee, right hip with 2 revisions   KIDNEY STONE SURGERY Bilateral    KNEE ARTHROTOMY Right 02/27/2018   Procedure: RIGHT KNEE ARTHROTOMY; scar excision;  Surgeon: Ollen Gross, MD;  Location: [facility] ORS;  Service: Orthopedics;  Laterality: Right;   MOHS SURGERY     RIGHT/LEFT HEART CATH AND CORONARY ANGIOGRAPHY N/A 07/30/2023   Procedure: RIGHT/LEFT HEART CATH AND CORONARY ANGIOGRAPHY;  Surgeon: Kathleene Hazel, MD;  Location: [facility] INVASIVE CV LAB;  Service: Cardiovascular;  Laterality: N/A;   TONSILLECTOMY     TOTAL HIP ARTHROPLASTY     TOTAL KNEE ARTHROPLASTY  01/22/2013   Procedure: TOTAL KNEE ARTHROPLASTY;  Surgeon: Loanne Drilling, MD;  Location: [facility] ORS;  Service: Orthopedics;  Laterality: Right;  Revision of a Right Uni Knee to a Total Knee Arthroplasty     A IV Location/Drains/Wounds Patient Lines/Drains/Airways Status     Active Line/Drains/Airways     Name Placement date Placement time Site Days   Peripheral IV 08/29/23 20 G Right Antecubital 08/29/23  1912  Antecubital  less than 1   Pressure Injury 08/01/23 Sacrum Medial Deep Tissue Pressure Injury - Purple or maroon localized area of discolored intact skin or blood-filled blister due to damage of underlying soft tissue from pressure and/or shear. 08/01/23  0000  -- 28            Intake/Output Last 24 hours No intake or output data in the  24 hours ending 08/29/23 2247  Labs/Imaging Results for orders placed or performed during the hospital encounter of 08/29/23 (from the past 48 hour(s))  CBC with Differential     Status: Abnormal   Collection Time: 08/29/23  7:16 PM  Result Value Ref Range   WBC 6.7 4.0 - 10.5 K/uL   RBC 4.13 3.87 - 5.11 MIL/uL   Hemoglobin 11.0 (L) 12.0 - 15.0 g/dL   HCT 16.1 (L) 09.6 - 04.5 %   MCV 85.0 80.0 - 100.0 fL   MCH 26.6 26.0 - 34.0 pg   MCHC 31.3 30.0 - 36.0 g/dL   RDW 40.9 (H) 81.1 - 91.4 %   Platelets 261 150 - 400 K/uL   nRBC 0.0 0.0 - 0.2 %   Neutrophils Relative % 77 %   Neutro Abs 5.2 1.7 - 7.7 K/uL   Lymphocytes Relative 14 %   Lymphs Abs 1.0 0.7 - 4.0 K/uL   Monocytes Relative 7 %   Monocytes Absolute 0.5 0.1 - 1.0 K/uL   Eosinophils Relative 1 %   Eosinophils Absolute 0.0 0.0 - 0.5 K/uL   Basophils Relative 0 %   Basophils Absolute 0.0 0.0 - 0.1 K/uL   Immature Granulocytes 1 %   Abs Immature Granulocytes 0.04 0.00 - 0.07 K/uL    Comment: Performed at [facility], [address], [place], [place] [address]  Comprehensive metabolic panel     Status: Abnormal   Collection Time: 08/29/23  7:16 PM  Result Value Ref Range   Sodium 129 (L) 135 - 145 mmol/L   Potassium 3.9 3.5 - 5.1 mmol/L   Chloride 97 (L) 98 - 111 mmol/L   CO2 19 (L) 22 - 32 mmol/L   Glucose, Bld 109 (H) 70 - 99 mg/dL    Comment: Glucose reference range applies only to samples taken after fasting for at least 8 hours.   BUN 20 8 - 23 mg/dL   Creatinine, Ser 6.21 0.44 - 1.00 mg/dL   Calcium 8.2 (L) 8.9 - 10.3 mg/dL   Total Protein 5.2 (L) 6.5 - 8.1 g/dL   Albumin 2.2 (L) 3.5 - 5.0 g/dL   AST 24 15 - 41 U/L   ALT 25 0 - 44 U/L   Alkaline Phosphatase 120 38 - 126 U/L   Total Bilirubin 0.6 0.3 - 1.2 mg/dL   GFR, Estimated 60 (L) >60 mL/min    Comment: (NOTE) Calculated using the CKD-EPI Creatinine Equation (2021)    Anion gap 13 5 - 15    Comment: Performed at [facility], [address] [place], [place] [address]  Troponin I (High Sensitivity)     Status: None   Collection Time: 08/29/23  7:16 PM  Result Value  Ref Range   Troponin I (High Sensitivity) 10 <18 ng/L    Comment: (NOTE) Elevated high sensitivity troponin I (hsTnI) values and significant  changes across serial measurements may suggest ACS but many other  chronic and acute conditions are known to elevate hsTnI results.  Refer to the "Links" section for chest pain algorithms and additional  guidance. Performed at [facility], [address], [place], [place] [address]   TSH     Status: Abnormal   Collection Time: 08/29/23  7:16 PM  Result Value Ref Range   TSH 6.326 (H) 0.350 - 4.500 uIU/mL    Comment: Performed by a 3rd Generation assay with a functional sensitivity of <=0.01 uIU/mL. Performed at [facility], [address] [place], [place] [address]   SARS Coronavirus 2 by RT PCR (hospital order, performed in [facility] hospital lab) *cepheid single result test* Anterior Nasal Swab     Status: None   Collection Time: 08/29/23  7:17 PM   Specimen: Anterior Nasal Swab  Result Value Ref Range   SARS Coronavirus 2 by RT PCR NEGATIVE NEGATIVE    Comment: Performed at [facility], [address], [place], [place] [address]  Urinalysis, w/ Reflex to Culture (Infection Suspected) -Urine, Clean Catch     Status: Abnormal   Collection Time: 08/29/23  8:12 PM  Result Value Ref Range   Specimen Source URINE, CLEAN CATCH    Color, Urine AMBER (A) YELLOW    Comment: BIOCHEMICALS MAY BE AFFECTED BY COLOR   APPearance CLOUDY (A) CLEAR   Specific Gravity, Urine 1.014 1.005 - 1.030   pH 5.0 5.0 - 8.0   Glucose, UA NEGATIVE NEGATIVE mg/dL   Hgb urine dipstick LARGE (A) NEGATIVE   Bilirubin Urine NEGATIVE NEGATIVE   Ketones, ur NEGATIVE NEGATIVE mg/dL   Protein, ur 914 (A) NEGATIVE mg/dL   Nitrite NEGATIVE NEGATIVE   Leukocytes,Ua LARGE (A) NEGATIVE   RBC / HPF >50 0 - 5 RBC/hpf   WBC, UA  >50 0 - 5 WBC/hpf    Comment:        Reflex urine culture not performed if WBC <=10, OR if Squamous epithelial cells >5. If Squamous epithelial cells >5 suggest recollection.    Bacteria, UA MANY (A) NONE SEEN   Squamous Epithelial / HPF 0-5 0 - 5 /HPF   Mucus PRESENT     Comment: Performed at [facility], [address], [place], [place] [address]   DG Chest Port 1 View  Result Date: 08/29/2023 CLINICAL DATA:  Altered mental status. EXAM: PORTABLE CHEST 1 VIEW COMPARISON:  08/07/2023, CT 07/31/2023 FINDINGS: Normal heart size and mediastinal contours. Mild blunting of the right costophrenic angle likely related to scarring a possible trace pleural effusion. No confluent consolidation or pneumothorax. Normal pulmonary vasculature. IMPRESSION: Mild blunting of the right costophrenic angle likely related to scarring a possible trace pleural effusion. Electronically Signed   By: Narda Rutherford M.D.   On: 08/29/2023 20:49   CT HEAD WO CONTRAST ( )  Result Date: 08/29/2023 CLINICAL DATA:  Mental status change. EXAM: CT HEAD WITHOUT CONTRAST TECHNIQUE: Contiguous axial images were obtained from the base of the skull through the vertex without intravenous contrast. RADIATION DOSE REDUCTION: This exam was performed according to the departmental dose-optimization program which includes automated exposure control, adjustment of the mA and/or kV according to patient size and/or use of iterative reconstruction technique. COMPARISON:  MRI and CT scan head from 08/06/2023.  FINDINGS: Brain: No evidence of acute infarction, hemorrhage, hydrocephalus, extra-axial collection or mass lesion/mass effect. 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 There is bilateral periventricular hypodensity, which is non-specific but most likely seen in the settings of microvascular ischemic changes. Mild in extent. Vascular: No hyperdense vessel or unexpected calcification. Intracranial  arteriosclerosis. Skull: Normal. Negative for fracture or focal lesion. Sinuses/Orbits: No acute finding. Other: There are soft tissue attenuation areas in bilateral external auditory canal, likely cerumen. IMPRESSION: No acute intracranial abnormality. Electronically Signed   By: Jules Schick M.D.   On: 08/29/2023 20:19   CT Renal Stone Study  Result Date: 08/29/2023 CLINICAL DATA:  Abdominal/flank pain, stone suspected EXAM: CT ABDOMEN AND PELVIS WITHOUT CONTRAST TECHNIQUE: Multidetector CT imaging of the abdomen and pelvis was performed following the standard protocol without IV contrast. RADIATION DOSE REDUCTION: This exam was performed according to the departmental dose-optimization program which includes automated exposure control, adjustment of the mA and/or kV according to patient size and/or use of iterative reconstruction technique. COMPARISON:  CT scan abdomen and pelvis report from 11/14/2016. FINDINGS: Lower chest: The lung bases are clear. At least small right pleural effusion, partially imaged. No left pleural effusion. The heart is normal in size. No pericardial effusion. Partially seen bilateral breast implants. Hepatobiliary: The liver is normal in size. Non-cirrhotic configuration. No suspicious mass. No intrahepatic or extrahepatic bile duct dilation. No calcified gallstones. Normal gallbladder wall thickness. No pericholecystic inflammatory changes. Pancreas: Unremarkable. No pancreatic ductal dilatation or surrounding inflammatory changes. Spleen: Within normal limits. No focal lesion. Adrenals/Urinary Tract: Adrenal glands are unremarkable. No suspicious renal mass seen within the limitations of this unenhanced exam. There is a 1.2 x 1.7 cm calculus in the left pelviureteric junction causing moderate hydronephrosis. There are additional multiple subcentimeter sized calculi in the left renal collecting system. Left ureter is nondilated. No right nephroureterolithiasis or obstructive  uropathy. 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 Urinary bladder is not diagnostically evaluated due to extensive streak artifacts. Stomach/Bowel: No disproportionate dilation of the small or large bowel loops. No evidence of abnormal bowel wall thickening or inflammatory changes. The appendix was not visualized; however there is no acute inflammatory process in the right lower quadrant. Vascular/Lymphatic: No ascites or pneumoperitoneum. No abdominal or pelvic lymphadenopathy, by size criteria. No aneurysmal dilation of the major abdominal arteries. There are mild peripheral atherosclerotic vascular calcifications of the aorta and its major branches. Reproductive: Not diagnostically evaluated due to extensive streak artifacts. Other: The visualized soft tissues and abdominal wall are unremarkable. Musculoskeletal: No suspicious osseous lesions. There are moderate multilevel degenerative changes in the visualized spine. IMPRESSION: 1. There is a 1.7 cm left pelvic ureteric junction calculus causing moderate hydronephrosis. There are additional several subcentimeter sized calculi in the left kidney. 2. No right nephroureterolithiasis or obstructive uropathy. 3. Multiple other nonacute observations, as described above. Electronically Signed   By: Jules Schick M.D.   On: 08/29/2023 20:15    Pending Labs Unresulted Labs (From admission, onward)     Start     Ordered   08/29/23 2118  Lactic acid, plasma  (Lactic Acid)  Now then every 2 hours,   R      08/29/23 2118   08/29/23 2118  Blood culture (routine x 2)  BLOOD CULTURE X 2,   R      08/29/23 2118   08/29/23 2012  Urine Culture  Once,  R        08/29/23 2012            Vitals/Pain Today's Vitals   08/29/23 1915 08/29/23 1930 08/29/23 2015 08/29/23 2030  BP: 120/64 129/69 (!) 141/77 126/71  Pulse: 68 68 66 69  Resp: 14 15 17 15   Temp:      TempSrc:      SpO2:  100% 100% 100% 100%  Weight:      Height:      PainSc:        Isolation Precautions No active isolations  Medications Medications  cefTRIAXone (ROCEPHIN) 1 g in sodium chloride 0.9 % 100 mL IVPB (has no administration in time range)    Mobility manual wheelchair     Focused Assessments Renal Assessment Handoff:  Hemodialysis Schedule:  Last Hemodialysis date and time:    Restricted appendage:    R Recommendations: See Admitting Provider Note  Report given to:   Additional Notes: Patient here from SNF with pylonephritis secondary to kidney stone Dementa a/o x 2 at baseline follows commands vs wnl came from [facility] care facility is able to swallow pills without difficulty

## 2023-08-29 NOTE — ED Notes (Signed)
Pt strait cath with female 8 fr in/out cath using sterile technique. Pt tolerated well. Urine brown and cloudy with foul smell collected and sent to lab

## 2023-08-29 NOTE — ED Notes (Signed)
Assumed care of pt here from SNF after staff called 911 stating she had a kidney stone and altered mental status. Patient has dementia and baseline orientation is a/o x 2 which patient is currentlly at. When asked patient does not know why she is here. Spouse to arrive shortly and hopefully he will have more info. Patient a/o x 2 respirations even and non labored has no current needs or complaints.

## 2023-08-29 NOTE — ED Provider Notes (Signed)
[facility] Provider Note   CSN: [id number] Arrival date & time: 08/29/23  1843     History  Chief Complaint  Patient presents with   Altered Mental Status    Kristen Hansen is a 77 y.o. female.  #Altered Mental Status 77 year old female presenting from [facility] due to Altered mental status and a possible kidney stone that was seen on imaging at the facility.   Patient is oriented to time, place, self and person. Denies any pain and says " I feel fine,I don't even know I am here" Denies any fall.     Home Medications Prior to Admission medications   Medication Sig Start Date End Date Taking? Authorizing Provider  meclizine (ANTIVERT) 12.5 MG tablet Take 12.5 mg by mouth every 8 (eight) hours as needed. 08/19/23  Yes [provider]  metoprolol succinate (TOPROL-XL) 25 MG 24 hr tablet Take 25 mg by mouth daily. 07/17/23  Yes [provider]  sulfamethoxazole-trimethoprim (BACTRIM DS) 800-160 MG tablet Take 1 tablet by mouth 2 (two) times daily. 08/19/23  Yes [provider]  acetaminophen (TYLENOL) 325 MG tablet Take 2 tablets (650 mg total) by mouth every 6 (six) hours as needed for mild pain (or Fever >/= 101). 08/08/23   Barnetta Chapel, MD  amiodarone (PACERONE) 200 MG tablet Take 1 tablet (200 mg total) by mouth daily. 08/09/23   Barnetta Chapel, MD  apixaban (ELIQUIS) 5 MG TABS tablet Take 1 tablet (5 mg total) by mouth 2 (two) times daily. 08/08/23   Berton Mount I, MD  colchicine 0.6 MG tablet Take 1 tablet (0.6 mg total) by mouth daily. 08/09/23   Barnetta Chapel, MD  irbesartan (AVAPRO) 150 MG tablet Take 1 tablet (150 mg total) by mouth daily. 08/09/23   Barnetta Chapel, MD  lamoTRIgine (LAMICTAL) 150 MG tablet Take 1 tablet by mouth once daily Patient not taking: Reported on 07/25/2023 10/06/22   Sater, Pearletha Furl, MD  methimazole (TAPAZOLE) 10 MG tablet Take 1 tablet (10 mg total)  by mouth daily. 08/09/23   Barnetta Chapel, MD  metoprolol tartrate (LOPRESSOR) 25 MG tablet Take 1 tablet (25 mg total) by mouth 2 (two) times daily. 08/08/23   Barnetta Chapel, MD  montelukast (SINGULAIR) 10 MG tablet Take 10 mg by mouth at bedtime.    [provider]  Mouthwashes (MOUTH RINSE) LIQD solution 15 mLs by Mouth Rinse route as needed (oral care). 08/08/23   Barnetta Chapel, MD  nitroGLYCERIN (NITROSTAT) 0.4 MG SL tablet Place 1 tablet (0.4 mg total) under the tongue every 5 (five) minutes as needed for chest pain. 08/08/23   Barnetta Chapel, MD  OXcarbazepine (TRILEPTAL) 300 MG tablet Take 1 tablet (300 mg total) by mouth 2 (two) times daily. 08/08/23   Barnetta Chapel, MD  pantoprazole (PROTONIX) 40 MG tablet Take 1 tablet (40 mg total) by mouth daily. 08/09/23   Barnetta Chapel, MD  polyethylene glycol (MIRALAX / GLYCOLAX) 17 g packet Take 17 g by mouth daily as needed for mild constipation. 08/08/23   Barnetta Chapel, MD  pravastatin (PRAVACHOL) 40 MG tablet Take 40 mg by mouth daily before breakfast.     [provider]  pyridostigmine (MESTINON) 60 MG tablet Take 1 tablet (60 mg total) by mouth every 8 (eight) hours. 08/08/23   Berton Mount I, MD  sertraline (ZOLOFT) 50 MG tablet Take 1 tablet by mouth  once daily 01/22/23   Sater, Pearletha Furl, MD  spironolactone (ALDACTONE) 25 MG tablet Take 0.5 tablets (12.5 mg total) by mouth daily. 08/09/23   Barnetta Chapel, MD  valACYclovir (VALTREX) 500 MG tablet Take 1,000 mg by mouth every 12 (twelve) hours as needed (for fever blisters).    [provider]      Allergies    Morphine and codeine, Prevacid [lansoprazole], Provigil [modafinil], and Adhesive [tape]    Review of Systems   Review of Systems  Unable to perform ROS: Mental status change    Physical Exam Updated Vital Signs BP 126/71   Pulse 69   Temp (!) 97.5 F (36.4 C) (Oral)   Resp 15   Ht 5\' 10"  (1.778 m)   Wt  56 kg   SpO2 100%   BMI 17.71 kg/m  Physical Exam Constitutional:      Comments: Frail appearing  Cardiovascular:     Rate and Rhythm: Normal rate and regular rhythm.  Pulmonary:     Effort: Pulmonary effort is normal.     Breath sounds: Normal breath sounds.  Abdominal:     General: Abdomen is flat.  Skin:    General: Skin is warm and dry.  Psychiatric:        Mood and Affect: Mood normal.     ED Results / Procedures / Treatments   Labs (all labs ordered are listed, but only abnormal results are displayed) Labs Reviewed  CBC WITH DIFFERENTIAL/PLATELET - Abnormal; Notable for the following components:      Result Value   Hemoglobin 11.0 (*)    HCT 35.1 (*)    RDW 16.3 (*)    All other components within normal limits  COMPREHENSIVE METABOLIC PANEL - Abnormal; Notable for the following components:   Sodium 129 (*)    Chloride 97 (*)    CO2 19 (*)    Glucose, Bld 109 (*)    Calcium 8.2 (*)    Total Protein 5.2 (*)    Albumin 2.2 (*)    GFR, Estimated 60 (*)    All other components within normal limits  URINALYSIS, W/ REFLEX TO CULTURE (INFECTION SUSPECTED) - Abnormal; Notable for the following components:   Color, Urine AMBER (*)    APPearance CLOUDY (*)    Hgb urine dipstick LARGE (*)    Protein, ur 100 (*)    Leukocytes,Ua LARGE (*)    Bacteria, UA MANY (*)    All other components within normal limits  TSH - Abnormal; Notable for the following components:   TSH 6.326 (*)    All other components within normal limits  SARS CORONAVIRUS 2 BY RT PCR  URINE CULTURE  CULTURE, BLOOD (ROUTINE X 2)  CULTURE, BLOOD (ROUTINE X 2)  LACTIC ACID, PLASMA  LACTIC ACID, PLASMA  TROPONIN I (HIGH SENSITIVITY)  TROPONIN I (HIGH SENSITIVITY)    EKG EKG Interpretation Date/Time:  Wednesday August 29 2023 19:17:53 EDT Ventricular Rate:  69 PR Interval:    QRS Duration:  162 QT Interval:  421 QTC Calculation: 451 R Axis:   49  Text Interpretation: Atrial fibrillation  Probable left ventricular hypertrophy Artifact in lead(s) I III aVR aVL No significant change since last tracing Confirmed by Richardean Canal [phone]) on 08/29/2023 7:22:27 PM  Radiology DG Chest Port 1 View  Result Date: 08/29/2023 CLINICAL DATA:  Altered mental status. EXAM: PORTABLE CHEST 1 VIEW COMPARISON:  08/07/2023, CT 07/31/2023 FINDINGS: Normal heart size and mediastinal contours. Mild blunting  of the right costophrenic angle likely related to scarring a possible trace pleural effusion. No confluent consolidation or pneumothorax. Normal pulmonary vasculature. IMPRESSION: Mild blunting of the right costophrenic angle likely related to scarring a possible trace pleural effusion. Electronically Signed   By: Narda Rutherford M.D.   On: 08/29/2023 20:49   CT HEAD WO CONTRAST ( )  Result Date: 08/29/2023 CLINICAL DATA:  Mental status change. EXAM: CT HEAD WITHOUT CONTRAST TECHNIQUE: Contiguous axial images were obtained from the base of the skull through the vertex without intravenous contrast. RADIATION DOSE REDUCTION: This exam was performed according to the departmental dose-optimization program which includes automated exposure control, adjustment of the mA and/or kV according to patient size and/or use of iterative reconstruction technique. COMPARISON:  MRI and CT scan head from 08/06/2023. FINDINGS: Brain: No evidence of acute infarction, hemorrhage, hydrocephalus, extra-axial collection or mass lesion/mass effect. 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 There is bilateral periventricular hypodensity, which is non-specific but most likely seen in the settings of microvascular ischemic changes. Mild in extent. Vascular: No hyperdense vessel or unexpected calcification. Intracranial arteriosclerosis. Skull: Normal. Negative for fracture or focal lesion. Sinuses/Orbits: No acute finding. Other: There are soft tissue attenuation areas in bilateral external auditory canal,  likely cerumen. IMPRESSION: No acute intracranial abnormality. Electronically Signed   By: Jules Schick M.D.   On: 08/29/2023 20:19   CT Renal Stone Study  Result Date: 08/29/2023 CLINICAL DATA:  Abdominal/flank pain, stone suspected EXAM: CT ABDOMEN AND PELVIS WITHOUT CONTRAST TECHNIQUE: Multidetector CT imaging of the abdomen and pelvis was performed following the standard protocol without IV contrast. RADIATION DOSE REDUCTION: This exam was performed according to the departmental dose-optimization program which includes automated exposure control, adjustment of the mA and/or kV according to patient size and/or use of iterative reconstruction technique. COMPARISON:  CT scan abdomen and pelvis report from 11/14/2016. FINDINGS: Lower chest: The lung bases are clear. At least small right pleural effusion, partially imaged. No left pleural effusion. The heart is normal in size. No pericardial effusion. Partially seen bilateral breast implants. Hepatobiliary: The liver is normal in size. Non-cirrhotic configuration. No suspicious mass. No intrahepatic or extrahepatic bile duct dilation. No calcified gallstones. Normal gallbladder wall thickness. No pericholecystic inflammatory changes. Pancreas: Unremarkable. No pancreatic ductal dilatation or surrounding inflammatory changes. Spleen: Within normal limits. No focal lesion. Adrenals/Urinary Tract: Adrenal glands are unremarkable. No suspicious renal mass seen within the limitations of this unenhanced exam. There is a 1.2 x 1.7 cm calculus in the left pelviureteric junction causing moderate hydronephrosis. There are additional multiple subcentimeter sized calculi in the left renal collecting system. Left ureter is nondilated. No right nephroureterolithiasis or obstructive uropathy. 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 Urinary bladder is not diagnostically evaluated due to extensive  streak artifacts. Stomach/Bowel: No disproportionate dilation of the small or large bowel loops. No evidence of abnormal bowel wall thickening or inflammatory changes. The appendix was not visualized; however there is no acute inflammatory process in the right lower quadrant. Vascular/Lymphatic: No ascites or pneumoperitoneum. No abdominal or pelvic lymphadenopathy, by size criteria. No aneurysmal dilation of the major abdominal arteries. There are mild peripheral atherosclerotic vascular calcifications of the aorta and its major branches. Reproductive: Not diagnostically evaluated due to extensive streak artifacts. Other: The visualized soft tissues and abdominal wall are unremarkable. Musculoskeletal: No suspicious osseous lesions. There are moderate multilevel degenerative changes in the visualized spine.  IMPRESSION: 1. There is a 1.7 cm left pelvic ureteric junction calculus causing moderate hydronephrosis. There are additional several subcentimeter sized calculi in the left kidney. 2. No right nephroureterolithiasis or obstructive uropathy. 3. Multiple other nonacute observations, as described above. Electronically Signed   By: Jules Schick M.D.   On: 08/29/2023 20:15    Procedures Procedures    Medications Ordered in ED Medications  cefTRIAXone (ROCEPHIN) 1 g in sodium chloride 0.9 % 100 mL IVPB (has no administration in time range)    ED Course/ Medical Decision Making/ A&P Clinical Course as of 08/29/23 2151  Wed Aug 29, 2023  2114 Urinalysis, w/ Reflex to Culture (Infection Suspected) -Urine, Clean Catch(!) [PA]    Clinical Course User Index [PA] Kathleen Lime, MD                                 Medical Decision Making  This patient is a 77 y.o. female who presents to the ED for concern of altered mental status and an incidental kidney stone on an abdominal x-ray at her nursing facility, this involves an extensive number of treatment options, and is a complaint that carries with it  a high risk of complications and morbidity. The emergent differential diagnosis prior to evaluation includes, but is not limited to, UTI, intracranial bleed or medication side effect. This is not an exhaustive differential.   Lab Tests: I ordered, and personally interpreted labs.  The pertinent results include:  positive UA concerning for UTI.   Imaging Studies: I ordered imaging studies including CT Abdomen and Pelvis. I independently visualized and interpreted imaging which showed There is a 1.7 cm left pelvic ureteric junction calculus causing moderate hydronephrosis. There are additional several subcentimeter sized calculi in the left kidney. I agree with the radiologist interpretation.   Consultations Obtained: I requested consultation with Urology ,  and discussed lab and imaging findings as well as pertinent plan - they recommend admitting patient today and have them follow-up with the patient tomorrow   Disposition: After consideration of the diagnostic results and the patients response to treatment, I feel that the emergency department workup does  suggest an emergent condition requiring admission or immediate intervention beyond what has been performed at this time. The plan is to admit patient.  I spoke to the hospitalist on-call Dr. Maisie Fus who agreed to admit the patient for infected kidney stone.    I discussed this case with my attending physician Dr. Silverio Lay who cosigned this note including patient's presenting symptoms, physical exam, and planned diagnostics and interventions. Attending physician stated agreement with plan or made changes to plan which were implemented.           Final Clinical Impression(s) / ED Diagnoses Final diagnoses:  Acute pyelonephritis  Kidney stone    Rx / DC Orders ED Discharge Orders     None         Kathleen Lime, MD 08/29/23 2153    Charlynne Pander, MD 09/03/23 [phone]

## 2023-08-29 NOTE — ED Notes (Signed)
1st lactic is in normal range the 2nd isn't needed

## 2023-08-29 NOTE — H&P (Incomplete)
History and Physical    Kristen Hansen IRJ:[id number] DOB: 02/16/1946 DOA: 08/29/2023  PCP: Kerin Salen, PA-C  Patient coming from: [facility]  I have personally briefly reviewed patient's old medical records in [facility]  Chief Complaint: change MS with hx of Kidney stone  HPI: Kristen Hansen is a 77 y.o. female with medical history significant of  PMH of RA, lewy-body dementia, REM sleep d/o, prior DVT s/p treatment, Sjogren's syndrome, chronic diastolic CHF, orthostatic hypotension,CKDIII,GERD, hyperthyroidism,mitral stenosis and hypertension. Patient also has interim history of diagnosis of  pericarditis, Paroxsymal,atrial fibrillation, right pleural effusion s/p throacentesis/chest tube, during recent hospitalization 7//31-8/14. Patient now presents to ED with complaint of change in mental status from baseline.In addition to hx of renal stone seen on recent imaging done at snf.  Patient of note on arrival to ED was noted to be oriented to time , place and person. Patient also had no complaints , and states she does not know why she was sent to ED. Patient currently states she feels well and has no complaint on my interview. Patient however was alert and oriented to place and self only at time of interview. On ros she denies n/v/d/dysuria/ chest pain, fever or chills.   ED Course:  Vitals 97.5, bp 128;/70, hr 69, rr 12 sat 100% no ra  Wbc 6.7, hgb 11, plt 261,  Na 129 ( 128-130),K 3.9, cl 97, bicarb 19, cr 0.98 TSH  6.3( was hyperthyroid on previous measurement one month ago with tsh <0.01) Lactic 1.2 Resp panel Neg EKG afib at 69  UA: +wbc >50,+bacteria TKZ:SWFU blunting of the right costophrenic angle likely related to scarring a possible trace pleural effusion.   CT abd stone protocol  IMPRESSION: 1. There is a 1.7 cm left pelvic ureteric junction calculus causing moderate hydronephrosis. There are additional several subcentimeter sized calculi in the left  kidney. 2. No right nephroureterolithiasis or obstructive uropathy. 3. Multiple other nonacute observations, as described above. Review of Systems: As per HPI otherwise 10 point review of systems negative.   Past Medical History:  Diagnosis Date   Back pain    Cancer (HCC)    Skin, basal cell   Dyspnea    Fatigue    GERD (gastroesophageal reflux disease)    Headache(784.0)    history of migraines   History of kidney stones    Hypercholesteremia    Hypertension    Leg swelling    Bilateral   Occasional tremors    mild hand tremors   Osteoarthritis    PONV (postoperative nausea and vomiting)    RBD (REM behavioral disorder)    Rheumatoid arthritis(714.0)    Scoliosis of lumbar spine    Spinal stenosis    Multiple levels   Tic [place]    Trigeminal neuralgia 03/30/2015   Vision abnormalities     Past Surgical History:  Procedure Laterality Date   ABDOMINAL HYSTERECTOMY     AUGMENTATION MAMMAPLASTY Bilateral    COLONOSCOPY     IR THORACENTESIS ASP PLEURAL SPACE W/IMG GUIDE  07/26/2023   JOINT REPLACEMENT     right uni knee, right hip with 2 revisions   KIDNEY STONE SURGERY Bilateral    KNEE ARTHROTOMY Right 02/27/2018   Procedure: RIGHT KNEE ARTHROTOMY; scar excision;  Surgeon: Ollen Gross, MD;  Location: [facility];  Service: Orthopedics;  Laterality: Right;   MOHS SURGERY     RIGHT/LEFT HEART CATH AND CORONARY ANGIOGRAPHY N/A 07/30/2023   Procedure: RIGHT/LEFT HEART  CATH AND CORONARY ANGIOGRAPHY;  Surgeon: Kathleene Hazel, MD;  Location: [facility];  Service: Cardiovascular;  Laterality: N/A;   TONSILLECTOMY     TOTAL HIP ARTHROPLASTY     TOTAL KNEE ARTHROPLASTY  01/22/2013   Procedure: TOTAL KNEE ARTHROPLASTY;  Surgeon: Loanne Drilling, MD;  Location: [facility];  Service: Orthopedics;  Laterality: Right;  Revision of a Right Uni Knee to a Total Knee Arthroplasty     reports that she has never smoked. She has never used smokeless tobacco. She reports current  alcohol use. She reports that she does not use drugs.  Allergies  Allergen Reactions   Morphine And Codeine Hives   Prevacid [Lansoprazole] Other (See Comments)    unknown   Provigil [Modafinil] Other (See Comments)    dizziness   Adhesive [Tape] Rash    bandaids     Family History  Problem Relation Age of Onset   Heart disease Mother    Parkinson's disease Father     Prior to Admission medications   Medication Sig Start Date End Date Taking? Authorizing Provider  acetaminophen (TYLENOL) 325 MG tablet Take 2 tablets (650 mg total) by mouth every 6 (six) hours as needed for mild pain (or Fever >/= 101). 08/08/23  Yes Berton Mount I, MD  amiodarone (PACERONE) 200 MG tablet Take 1 tablet (200 mg total) by mouth daily. 08/09/23  Yes Barnetta Chapel, MD  apixaban (ELIQUIS) 5 MG TABS tablet Take 1 tablet (5 mg total) by mouth 2 (two) times daily. 08/08/23  Yes Berton Mount I, MD  colchicine 0.6 MG tablet Take 1 tablet (0.6 mg total) by mouth daily. 08/09/23  Yes Barnetta Chapel, MD  Guaifenesin 200 MG/5ML LIQD Take 10 mLs by mouth every 4 (four) hours as needed (cough and congestion).   Yes [provider]  irbesartan (AVAPRO) 150 MG tablet Take 1 tablet (150 mg total) by mouth daily. 08/09/23  Yes Barnetta Chapel, MD  lamoTRIgine (LAMICTAL) 150 MG tablet Take 1 tablet by mouth once daily 10/06/22  Yes Sater, Pearletha Furl, MD  meclizine (ANTIVERT) 12.5 MG tablet Take 12.5 mg by mouth every 8 (eight) hours as needed. 08/19/23  Yes [provider]  methimazole (TAPAZOLE) 10 MG tablet Take 1 tablet (10 mg total) by mouth daily. 08/09/23  Yes Berton Mount I, MD  metoprolol tartrate (LOPRESSOR) 25 MG tablet Take 1 tablet (25 mg total) by mouth 2 (two) times daily. 08/08/23  Yes Berton Mount I, MD  montelukast (SINGULAIR) 10 MG tablet Take 10 mg by mouth at bedtime.   Yes [provider]  Mouthwashes (MOUTH RINSE) LIQD solution 15 mLs by Mouth Rinse  route as needed (oral care). 08/08/23  Yes Barnetta Chapel, MD  nitroGLYCERIN (NITROSTAT) 0.4 MG SL tablet Place 1 tablet (0.4 mg total) under the tongue every 5 (five) minutes as needed for chest pain. 08/08/23  Yes Barnetta Chapel, MD  OXcarbazepine (TRILEPTAL) 300 MG tablet Take 1 tablet (300 mg total) by mouth 2 (two) times daily. 08/08/23  Yes Berton Mount I, MD  pantoprazole (PROTONIX) 40 MG tablet Take 1 tablet (40 mg total) by mouth daily. 08/09/23  Yes Berton Mount I, MD  polyethylene glycol (MIRALAX / GLYCOLAX) 17 g packet Take 17 g by mouth daily as needed for mild constipation. 08/08/23  Yes Berton Mount I, MD  pravastatin (PRAVACHOL) 40 MG tablet Take 40 mg by mouth daily before breakfast.    Yes [provider]  pyridostigmine (MESTINON) 60 MG tablet Take 1 tablet (60 mg total) by mouth every 8 (eight) hours. 08/08/23  Yes Berton Mount I, MD  sertraline (ZOLOFT) 50 MG tablet Take 1 tablet by mouth once daily 01/22/23  Yes Sater, Pearletha Furl, MD  spironolactone (ALDACTONE) 25 MG tablet Take 0.5 tablets (12.5 mg total) by mouth daily. 08/09/23  Yes Barnetta Chapel, MD  valACYclovir (VALTREX) 500 MG tablet Take 1,000 mg by mouth every 12 (twelve) hours as needed (for fever blisters).   Yes [provider]  sulfamethoxazole-trimethoprim (BACTRIM DS) 800-160 MG tablet Take 1 tablet by mouth 2 (two) times daily. Patient not taking: Reported on 08/29/2023 08/19/23   [provider]    Physical Exam: Vitals:   08/29/23 1915 08/29/23 1930 08/29/23 2015 08/29/23 2030  BP: 120/64 129/69 (!) 141/77 126/71  Pulse: 68 68 66 69  Resp: 14 15 17 15   Temp:      TempSrc:      SpO2: 100% 100% 100% 100%  Weight:      Height:        Constitutional: NAD, calm, comfortable Vitals:   08/29/23 1915 08/29/23 1930 08/29/23 2015 08/29/23 2030  BP: 120/64 129/69 (!) 141/77 126/71  Pulse: 68 68 66 69  Resp: 14 15 17 15   Temp:      TempSrc:      SpO2:  100% 100% 100% 100%  Weight:      Height:       Eyes: PERRL, lids and conjunctivae normal ENMT: Mucous membranes are moist. Posterior pharynx clear of any exudate or lesions.Normal dentition.  Neck: normal, supple, no masses, no thyromegaly Respiratory: clear to auscultation bilaterally, no wheezing, no crackles. Normal respiratory effort. No accessory muscle use.  Cardiovascular: Regular rate and rhythm, no murmurs / rubs / gallops. No extremity edema. 2+ pedal pulses. Abdomen: no tenderness, no masses palpated. No hepatosplenomegaly. Bowel sounds positive.  Musculoskeletal: no clubbing / cyanosis. No joint deformity upper and lower extremities. Good ROM, no contractures. Normal muscle tone.  Skin: no rashes, lesions, ulcers. No induration Neurologic: CN 2-12 grossly intact. Sensation intact, Strength 5/5 in all 4.  Psychiatric:  Alert and oriented x 2. Normal mood.    Labs on Admission: I have personally reviewed following labs and imaging studies  CBC: Recent Labs  Lab 08/29/23 1916  WBC 6.7  NEUTROABS 5.2  HGB 11.0*  HCT 35.1*  MCV 85.0  PLT 261   Basic Metabolic Panel: Recent Labs  Lab 08/29/23 1916  NA 129*  K 3.9  CL 97*  CO2 19*  GLUCOSE 109*  BUN 20  CREATININE 0.98  CALCIUM 8.2*   GFR: Estimated Creatinine Clearance: 43.2 mL/min (by C-G formula based on SCr of 0.98 mg/dL). Liver Function Tests: Recent Labs  Lab 08/29/23 1916  AST 24  ALT 25  ALKPHOS 120  BILITOT 0.6  PROT 5.2*  ALBUMIN 2.2*   No results for input(s): "LIPASE", "AMYLASE" in the last 168 hours. No results for input(s): "AMMONIA" in the last 168 hours. Coagulation Profile: No results for input(s): "INR", "PROTIME" in the last 168 hours. Cardiac Enzymes: No results for input(s): "CKTOTAL", "CKMB", "CKMBINDEX", "TROPONINI" in the last 168 hours. BNP (last 3 results) No results for input(s): "PROBNP" in the last 8760 hours. HbA1C: No results for input(s): "HGBA1C" in the last 72  hours. CBG: No results for input(s): "GLUCAP" in the last 168 hours. Lipid Profile: No results for input(s): "CHOL", "HDL", "LDLCALC", "TRIG", "CHOLHDL", "LDLDIRECT" in the  last 72 hours. Thyroid Function Tests: Recent Labs    08/29/23 1916  TSH 6.326*   Anemia Panel: No results for input(s): "VITAMINB12", "FOLATE", "FERRITIN", "TIBC", "IRON", "RETICCTPCT" in the last 72 hours. Urine analysis:    Component Value Date/Time   COLORURINE AMBER (A) 08/29/2023 2012   APPEARANCEUR CLOUDY (A) 08/29/2023 2012   LABSPEC 1.014 08/29/2023 2012   PHURINE 5.0 08/29/2023 2012   GLUCOSEU NEGATIVE 08/29/2023 2012   HGBUR LARGE (A) 08/29/2023 2012   BILIRUBINUR NEGATIVE 08/29/2023 2012   KETONESUR NEGATIVE 08/29/2023 2012   PROTEINUR 100 (A) 08/29/2023 2012   UROBILINOGEN 0.2 01/16/2013 1429   NITRITE NEGATIVE 08/29/2023 2012   LEUKOCYTESUR LARGE (A) 08/29/2023 2012    Radiological Exams on Admission: DG Chest Port 1 View  Result Date: 08/29/2023 CLINICAL DATA:  Altered mental status. EXAM: PORTABLE CHEST 1 VIEW COMPARISON:  08/07/2023, CT 07/31/2023 FINDINGS: Normal heart size and mediastinal contours. Mild blunting of the right costophrenic angle likely related to scarring a possible trace pleural effusion. No confluent consolidation or pneumothorax. Normal pulmonary vasculature. IMPRESSION: Mild blunting of the right costophrenic angle likely related to scarring a possible trace pleural effusion. Electronically Signed   By: Narda Rutherford M.D.   On: 08/29/2023 20:49   CT HEAD WO CONTRAST ( )  Result Date: 08/29/2023 CLINICAL DATA:  Mental status change. EXAM: CT HEAD WITHOUT CONTRAST TECHNIQUE: Contiguous axial images were obtained from the base of the skull through the vertex without intravenous contrast. RADIATION DOSE REDUCTION: This exam was performed according to the departmental dose-optimization program which includes automated exposure control, adjustment of the mA and/or kV  according to patient size and/or use of iterative reconstruction technique. COMPARISON:  MRI and CT scan head from 08/06/2023. FINDINGS: Brain: No evidence of acute infarction, hemorrhage, hydrocephalus, extra-axial collection or mass lesion/mass effect. 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 There is bilateral periventricular hypodensity, which is non-specific but most likely seen in the settings of microvascular ischemic changes. Mild in extent. Vascular: No hyperdense vessel or unexpected calcification. Intracranial arteriosclerosis. Skull: Normal. Negative for fracture or focal lesion. Sinuses/Orbits: No acute finding. Other: There are soft tissue attenuation areas in bilateral external auditory canal, likely cerumen. IMPRESSION: No acute intracranial abnormality. Electronically Signed   By: Jules Schick M.D.   On: 08/29/2023 20:19   CT Renal Stone Study  Result Date: 08/29/2023 CLINICAL DATA:  Abdominal/flank pain, stone suspected EXAM: CT ABDOMEN AND PELVIS WITHOUT CONTRAST TECHNIQUE: Multidetector CT imaging of the abdomen and pelvis was performed following the standard protocol without IV contrast. RADIATION DOSE REDUCTION: This exam was performed according to the departmental dose-optimization program which includes automated exposure control, adjustment of the mA and/or kV according to patient size and/or use of iterative reconstruction technique. COMPARISON:  CT scan abdomen and pelvis report from 11/14/2016. FINDINGS: Lower chest: The lung bases are clear. At least small right pleural effusion, partially imaged. No left pleural effusion. The heart is normal in size. No pericardial effusion. Partially seen bilateral breast implants. Hepatobiliary: The liver is normal in size. Non-cirrhotic configuration. No suspicious mass. No intrahepatic or extrahepatic bile duct dilation. No calcified gallstones. Normal gallbladder wall thickness. No pericholecystic inflammatory  changes. Pancreas: Unremarkable. No pancreatic ductal dilatation or surrounding inflammatory changes. Spleen: Within normal limits. No focal lesion. Adrenals/Urinary Tract: Adrenal glands are unremarkable. No suspicious renal mass seen within the limitations of this unenhanced exam. There is a 1.2 x 1.7 cm calculus in the left pelviureteric junction causing moderate  hydronephrosis. There are additional multiple subcentimeter sized calculi in the left renal collecting system. Left ureter is nondilated. No right nephroureterolithiasis or obstructive uropathy. 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 Urinary bladder is not diagnostically evaluated due to extensive streak artifacts. Stomach/Bowel: No disproportionate dilation of the small or large bowel loops. No evidence of abnormal bowel wall thickening or inflammatory changes. The appendix was not visualized; however there is no acute inflammatory process in the right lower quadrant. Vascular/Lymphatic: No ascites or pneumoperitoneum. No abdominal or pelvic lymphadenopathy, by size criteria. No aneurysmal dilation of the major abdominal arteries. There are mild peripheral atherosclerotic vascular calcifications of the aorta and its major branches. Reproductive: Not diagnostically evaluated due to extensive streak artifacts. Other: The visualized soft tissues and abdominal wall are unremarkable. Musculoskeletal: No suspicious osseous lesions. There are moderate multilevel degenerative changes in the visualized spine. IMPRESSION: 1. There is a 1.7 cm left pelvic ureteric junction calculus causing moderate hydronephrosis. There are additional several subcentimeter sized calculi in the left kidney. 2. No right nephroureterolithiasis or obstructive uropathy. 3. Multiple other nonacute observations, as described above. Electronically Signed   By: Jules Schick M.D.   On: 08/29/2023 20:15     EKG: Independently reviewed.   Assessment/Plan   UTI with concern for infected renal stone  - admit to med tele  - continue broad spectrum abx  - f/u with culture data  - urology to see patient in am ( Dr [name] consulted )   Acute Metabolic Encephalopathy on baseline demention - due to acute infection  -treat underlying cause   -CTH NAD   Hx Pericarditis  (diagnosed hospitalization 7//31-8/14) Hx of Exudative pleural effusion ( now resolved) -presumed due to Autoimmune cause with hx of RA - continue on colchicine 0.6 Mg p.o. daily , with plans to complete 3 mo course  Paroxsymal Atrial fibrillation -continue on amiodarone, metoprolol, DOAC  CHF pef  - no acute exacerbation  -continue with metoprolol/lasix, arb ,spironolactone -recent cardiac cath clean c's  Hx of Dysphagia  - thought be related to Lewy body dementia progression -must maintain aspiration precautions    Lewy body dementia  -follows with neurology out patient  -On pyridostigmine   Orthostatic hypotension -continue with ted hose  Hyperthyrodism -noted elevated tsh  -hold tapazole for now  f/u on t3/t4 and titrate med as needed  Hypertension -stable bp  - continue  home regimen   HLD -continue with statin   Hx of DVT -s/p treatment  -no dvt noted no last dvt doppler studies  CKDIII - cr at baseline   GERD -ppi    Rheumatoid Arthritis/ Sjogren's syndrome  -Resume home regimen as able   Chronic Back pain /spinal stenosis lumbar region -supportive care   Depression - continue on SSRi  Trigeminal neuralgia  - continue on lamictal and trileptal    DVT prophylaxis: on DOAC Code Status: full/ as discussed per patient wishes in event of cardiac arrest  Family Communication:  Horne,Gary (Spouse) [phone] (Mobile)   Disposition Plan: patient  expected to be admitted greater than 2 midnights  Consults called: Urology Dr Cynda Familia  Admission status: med tele   Lurline Del MD [facility]   If 7PM-7AM, please contact night-coverage [email or web address] Password [facility]  08/29/2023, 10:33 PM

## 2023-08-29 NOTE — ED Notes (Signed)
Second lactic due at [facility]

## 2023-08-29 NOTE — ED Triage Notes (Signed)
Pt to the ed from [facility]. Per ems nursing staff relay spt has a kidney stone and has had a mental status change today. Staff was unable to clarify what the change was in mental status. Pt has dementia at base line and is A&Ox1. Pt does not have any pain or complaints at this time. Will contact family for more information.

## 2023-08-30 ENCOUNTER — Inpatient Hospital Stay (HOSPITAL_COMMUNITY): Payer: Medicare PPO | Admitting: Certified Registered"

## 2023-08-30 ENCOUNTER — Encounter (HOSPITAL_COMMUNITY): Payer: Self-pay | Admitting: Internal Medicine

## 2023-08-30 ENCOUNTER — Inpatient Hospital Stay (HOSPITAL_COMMUNITY): Payer: Medicare PPO

## 2023-08-30 ENCOUNTER — Encounter (HOSPITAL_COMMUNITY)
Admission: EM | Disposition: A | Discharge status: Skilled Nursing Facility | Payer: Self-pay | Source: Home / Self Care | Attending: Internal Medicine

## 2023-08-30 DIAGNOSIS — N201 Calculus of ureter: Secondary | ICD-10-CM

## 2023-08-30 DIAGNOSIS — Z7189 Other specified counseling: Secondary | ICD-10-CM

## 2023-08-30 DIAGNOSIS — N200 Calculus of kidney: Secondary | ICD-10-CM | POA: Diagnosis not present

## 2023-08-30 DIAGNOSIS — N10 Acute tubulo-interstitial nephritis: Secondary | ICD-10-CM

## 2023-08-30 DIAGNOSIS — N3000 Acute cystitis without hematuria: Secondary | ICD-10-CM | POA: Diagnosis not present

## 2023-08-30 DIAGNOSIS — E039 Hypothyroidism, unspecified: Secondary | ICD-10-CM

## 2023-08-30 DIAGNOSIS — Z515 Encounter for palliative care: Secondary | ICD-10-CM

## 2023-08-30 HISTORY — PX: CYSTOSCOPY W/ URETERAL STENT PLACEMENT: SHX1429

## 2023-08-30 LAB — COMPREHENSIVE METABOLIC PANEL
ALT: 22 U/L (ref 0–44)
AST: 20 U/L (ref 15–41)
Albumin: 2 g/dL — ABNORMAL LOW (ref 3.5–5.0)
Alkaline Phosphatase: 112 U/L (ref 38–126)
Anion gap: 10 (ref 5–15)
BUN: 18 mg/dL (ref 8–23)
CO2: 21 mmol/L — ABNORMAL LOW (ref 22–32)
Calcium: 8 mg/dL — ABNORMAL LOW (ref 8.9–10.3)
Chloride: 100 mmol/L (ref 98–111)
Creatinine, Ser: 1.06 mg/dL — ABNORMAL HIGH (ref 0.44–1.00)
GFR, Estimated: 54 mL/min — ABNORMAL LOW (ref >60)
Glucose, Bld: 88 mg/dL (ref 70–99)
Potassium: 3.9 mmol/L (ref 3.5–5.1)
Sodium: 131 mmol/L — ABNORMAL LOW (ref 135–145)
Total Bilirubin: 0.4 mg/dL (ref 0.3–1.2)
Total Protein: 4.7 g/dL — ABNORMAL LOW (ref 6.5–8.1)

## 2023-08-30 LAB — BLOOD CULTURE ID PANEL (REFLEXED) - BCID2

## 2023-08-30 LAB — BLOOD GAS, VENOUS
Acid-Base Excess: 1.3 mmol/L (ref 0.0–2.0)
Bicarbonate: 25.9 mmol/L (ref 20.0–28.0)
O2 Saturation: 42.1 %
Patient temperature: 36.3
pCO2, Ven: 39 mmHg — ABNORMAL LOW (ref 44–60)
pH, Ven: 7.43 (ref 7.25–7.43)
pO2, Ven: <31 mmHg — CL (ref 32–45)

## 2023-08-30 LAB — C-REACTIVE PROTEIN: CRP: 5.5 mg/dL — ABNORMAL HIGH (ref <1.0)

## 2023-08-30 LAB — CBC
HCT: 30.6 % — ABNORMAL LOW (ref 36.0–46.0)
Hemoglobin: 9.9 g/dL — ABNORMAL LOW (ref 12.0–15.0)
MCH: 26.4 pg (ref 26.0–34.0)
MCHC: 32.4 g/dL (ref 30.0–36.0)
MCV: 81.6 fL (ref 80.0–100.0)
Platelets: 220 10*3/uL (ref 150–400)
RBC: 3.75 MIL/uL — ABNORMAL LOW (ref 3.87–5.11)
RDW: 16.1 % — ABNORMAL HIGH (ref 11.5–15.5)
WBC: 6.5 10*3/uL (ref 4.0–10.5)
nRBC: 0 % (ref 0.0–0.2)

## 2023-08-30 LAB — LACTIC ACID, PLASMA: Lactic Acid, Venous: 0.8 mmol/L (ref 0.5–1.9)

## 2023-08-30 LAB — T4, FREE: Free T4: 0.34 ng/dL — ABNORMAL LOW (ref 0.61–1.12)

## 2023-08-30 LAB — MRSA NEXT GEN BY PCR, NASAL: MRSA by PCR Next Gen: NOT DETECTED

## 2023-08-30 LAB — PROCALCITONIN: Procalcitonin: 0.12 ng/mL

## 2023-08-30 SURGERY — CYSTOSCOPY, WITH RETROGRADE PYELOGRAM AND URETERAL STENT INSERTION
Anesthesia: General | Laterality: Left

## 2023-08-30 MED ORDER — ONDANSETRON HCL 4 MG/2ML IJ SOLN
INTRAMUSCULAR | Status: DC | PRN
  Administered 2023-08-30: 4 mg via INTRAVENOUS

## 2023-08-30 MED ORDER — PHENYLEPHRINE 80 MCG/ML (10ML) SYRINGE FOR IV PUSH (FOR BLOOD PRESSURE SUPPORT)
PREFILLED_SYRINGE | INTRAVENOUS | Status: DC | PRN
  Administered 2023-08-30: 80 ug via INTRAVENOUS

## 2023-08-30 MED ORDER — ORAL CARE MOUTH RINSE: 15.0000 mL | OROMUCOSAL | Status: DC | PRN

## 2023-08-30 MED ORDER — OXYCODONE HCL 5 MG/5ML PO SOLN: 5.0000 mg | Freq: Once | ORAL | Status: DC | PRN

## 2023-08-30 MED ORDER — PROPOFOL 10 MG/ML IV BOLUS
INTRAVENOUS | Status: AC
  Filled 2023-08-30: qty 20

## 2023-08-30 MED ORDER — PROPOFOL 10 MG/ML IV BOLUS
INTRAVENOUS | Status: DC | PRN
  Administered 2023-08-30: 90 mg via INTRAVENOUS

## 2023-08-30 MED ORDER — EPHEDRINE SULFATE-NACL 50-0.9 MG/10ML-% IV SOSY
PREFILLED_SYRINGE | INTRAVENOUS | Status: DC | PRN
  Administered 2023-08-30: 10 mg via INTRAVENOUS
  Administered 2023-08-30: 5 mg via INTRAVENOUS
  Administered 2023-08-30: 10 mg via INTRAVENOUS

## 2023-08-30 MED ORDER — INFLUENZA VAC A&B SURF ANT ADJ 0.5 ML IM SUSY
0.5000 mL | PREFILLED_SYRINGE | INTRAMUSCULAR | Status: AC
  Administered 2023-08-31: 0.5 mL via INTRAMUSCULAR
  Filled 2023-08-30: qty 0.5

## 2023-08-30 MED ORDER — NITROGLYCERIN 0.4 MG SL SUBL: 0.4000 mg | SUBLINGUAL_TABLET | SUBLINGUAL | Status: DC | PRN

## 2023-08-30 MED ORDER — ENSURE ENLIVE PO LIQD
237.0000 mL | Freq: Two times a day (BID) | ORAL | Status: DC
  Administered 2023-08-31 – 2023-09-03 (×7): 237 mL via ORAL

## 2023-08-30 MED ORDER — EPHEDRINE 5 MG/ML INJ
INTRAVENOUS | Status: AC
  Filled 2023-08-30: qty 5

## 2023-08-30 MED ORDER — OXYCODONE HCL 5 MG PO TABS: 5.0000 mg | ORAL_TABLET | Freq: Once | ORAL | Status: DC | PRN

## 2023-08-30 MED ORDER — FENTANYL CITRATE (PF) 250 MCG/5ML IJ SOLN
INTRAMUSCULAR | Status: AC
  Filled 2023-08-30: qty 5

## 2023-08-30 MED ORDER — SODIUM CHLORIDE 0.9 % IV SOLN
2.0000 g | INTRAVENOUS | Status: DC
  Administered 2023-08-30 – 2023-09-03 (×5): 2 g via INTRAVENOUS
  Filled 2023-08-30 (×5): qty 20

## 2023-08-30 MED ORDER — GUAIFENESIN 100 MG/5ML PO LIQD: 20.0000 mL | ORAL | Status: DC | PRN

## 2023-08-30 MED ORDER — ONDANSETRON HCL 4 MG/2ML IJ SOLN: 4.0000 mg | Freq: Four times a day (QID) | INTRAMUSCULAR | Status: DC | PRN

## 2023-08-30 MED ORDER — CHLORHEXIDINE GLUCONATE 0.12 % MT SOLN
OROMUCOSAL | Status: AC
  Filled 2023-08-30: qty 15

## 2023-08-30 MED ORDER — ONDANSETRON HCL 4 MG/2ML IJ SOLN
INTRAMUSCULAR | Status: AC
  Filled 2023-08-30: qty 2

## 2023-08-30 MED ORDER — LIDOCAINE 2% (20 MG/ML) 5 ML SYRINGE
INTRAMUSCULAR | Status: DC | PRN
  Administered 2023-08-30: 50 mg via INTRAVENOUS

## 2023-08-30 MED ORDER — FENTANYL CITRATE (PF) 100 MCG/2ML IJ SOLN
INTRAMUSCULAR | Status: AC
  Filled 2023-08-30: qty 2

## 2023-08-30 MED ORDER — LIDOCAINE 2% (20 MG/ML) 5 ML SYRINGE
INTRAMUSCULAR | Status: AC
  Filled 2023-08-30: qty 5

## 2023-08-30 MED ORDER — FENTANYL CITRATE (PF) 100 MCG/2ML IJ SOLN
25.0000 ug | INTRAMUSCULAR | Status: DC | PRN
  Administered 2023-08-30 (×2): 25 ug via INTRAVENOUS

## 2023-08-30 MED ORDER — METHIMAZOLE 5 MG PO TABS
10.0000 mg | ORAL_TABLET | Freq: Every day | ORAL | Status: DC
  Administered 2023-08-30 – 2023-09-03 (×5): 10 mg via ORAL
  Filled 2023-08-30 (×5): qty 2

## 2023-08-30 MED ORDER — LACTATED RINGERS IV SOLN: INTRAVENOUS | Status: AC

## 2023-08-30 MED ORDER — PHENYLEPHRINE 80 MCG/ML (10ML) SYRINGE FOR IV PUSH (FOR BLOOD PRESSURE SUPPORT)
PREFILLED_SYRINGE | INTRAVENOUS | Status: AC
  Filled 2023-08-30: qty 10

## 2023-08-30 MED ORDER — VALACYCLOVIR HCL 500 MG PO TABS: 1000.0000 mg | ORAL_TABLET | Freq: Two times a day (BID) | ORAL | Status: DC | PRN

## 2023-08-30 MED ORDER — FENTANYL CITRATE (PF) 250 MCG/5ML IJ SOLN
INTRAMUSCULAR | Status: DC | PRN
  Administered 2023-08-30: 25 ug via INTRAVENOUS

## 2023-08-30 SURGICAL SUPPLY — 23 items
BAG DRN RND TRDRP ANRFLXCHMBR (UROLOGICAL SUPPLIES) ×1
BAG URINE DRAIN 2000ML AR STRL (UROLOGICAL SUPPLIES) ×1 IMPLANT
BAG URO CATCHER STRL LF (MISCELLANEOUS) ×1 IMPLANT
CATH FOLEY 2WAY SLVR 5CC 16FR (CATHETERS) IMPLANT
CATH URETL OPEN END 6FR 70 (CATHETERS) ×1 IMPLANT
GLOVE BIOGEL M STRL SZ7.5 (GLOVE) ×1 IMPLANT
GOWN STRL REUS W/ TWL LRG LVL3 (GOWN DISPOSABLE) ×1 IMPLANT
GOWN STRL REUS W/ TWL XL LVL3 (GOWN DISPOSABLE) ×1 IMPLANT
GOWN STRL REUS W/TWL LRG LVL3 (GOWN DISPOSABLE) ×1
GOWN STRL REUS W/TWL XL LVL3 (GOWN DISPOSABLE) ×1
GUIDEWIRE ANG ZIPWIRE 038X150 (WIRE) IMPLANT
GUIDEWIRE STR DUAL SENSOR (WIRE) ×1 IMPLANT
KIT TURNOVER KIT B (KITS) ×1 IMPLANT
MANIFOLD NEPTUNE II (INSTRUMENTS) IMPLANT
NS IRRIG 1000ML POUR BTL (IV SOLUTION) IMPLANT
PACK CYSTO (CUSTOM PROCEDURE TRAY) ×1 IMPLANT
STENT CONTOUR 6FRX26X.038 (STENTS) IMPLANT
STENT URET 6FRX24 CONTOUR (STENTS) IMPLANT
STENT URET 6FRX26 CONTOUR (STENTS) IMPLANT
SYPHON OMNI JUG (MISCELLANEOUS) ×1 IMPLANT
TOWEL GREEN STERILE FF (TOWEL DISPOSABLE) ×1 IMPLANT
TUBE CONNECTING 12X1/4 (SUCTIONS) IMPLANT
WATER STERILE IRR 3000ML UROMA (IV SOLUTION) ×1 IMPLANT

## 2023-08-30 NOTE — Anesthesia Preprocedure Evaluation (Signed)
Anesthesia Evaluation  Patient identified by MRN, date of birth, ID band Patient awake    Reviewed: Allergy & Precautions, H&P , NPO status , Patient's Chart, lab work & pertinent test results  History of Anesthesia Complications (+) PONV and history of anesthetic complications  Airway Mallampati: II   Neck ROM: full    Dental   Pulmonary shortness of breath   breath sounds clear to auscultation       Cardiovascular hypertension,  Rhythm:regular Rate:Normal     Neuro/Psych  Headaches PSYCHIATRIC DISORDERS  Depression     Neuromuscular disease    GI/Hepatic ,GERD  ,,  Endo/Other   Hyperthyroidism   Renal/GU stones     Musculoskeletal  (+) Arthritis ,    Abdominal   Peds  Hematology   Anesthesia Other Findings   Reproductive/Obstetrics                             Anesthesia Physical Anesthesia Plan  ASA: 3  Anesthesia Plan: General   Post-op Pain Management:    Induction: Intravenous  PONV Risk Score and Plan: 4 or greater and Ondansetron, Dexamethasone and Treatment may vary due to age or medical condition  Airway Management Planned: LMA  Additional Equipment:   Intra-op Plan:   Post-operative Plan: Extubation in OR  Informed Consent: I have reviewed the patients History and Physical, chart, labs and discussed the procedure including the risks, benefits and alternatives for the proposed anesthesia with the patient or authorized representative who has indicated his/her understanding and acceptance.     Dental advisory given  Plan Discussed with: CRNA, Anesthesiologist and Surgeon  Anesthesia Plan Comments:        Anesthesia Quick Evaluation

## 2023-08-30 NOTE — Consult Note (Signed)
Urology Consult Note   Requesting Attending Physician:  Starleen Arms, MD Service Providing Consult: Urology  Consulting Attending: Dr. Sherron Monday   Reason for Consult: Obstructing ureteral stone.  HPI: Kristen Hansen is seen in consultation for reasons noted above at the request of Elgergawy, Leana Roe, MD Patient was accompanied by her husband.  Her dementia is quite advanced so she was an unreliable historian, though was included in the conversation.  Her husband reports that her mental status has deteriorated rapidly over the last few days.  While this may be due to her UTI/obstructing stone, they are aware that this may be advancement of her dementia.  All questions were answered to their satisfaction and they are amenable to proceeding with ureteral stent placement. ------------------  Assessment:  77 y.o. female with ureteral stone   Recommendations: # UTI Presently on Rocephin.  Agree with empiric ABX.  Tailor to sensitivities  # Ureteral stone CT A/P shows 1.2 x 1.7 obstructing left UPJ stone To the OR for left ureteral stent placement. Urine culture has not resulted yet but urinalysis is suggestive of infection.  Considering this, ureteral stent will be placed and patient will require definitive stone management when she is clear of infection on an outpatient basis.  In around 3-4 weeks. Follow along with you.  Case and plan discussed with Dr. Sherron Monday  Past Medical History: Past Medical History:  Diagnosis Date   Back pain    Cancer (HCC)    Skin, basal cell   Dyspnea    Fatigue    GERD (gastroesophageal reflux disease)    Headache(784.0)    history of migraines   History of kidney stones    Hypercholesteremia    Hypertension    Leg swelling    Bilateral   Occasional tremors    mild hand tremors   Osteoarthritis    PONV (postoperative nausea and vomiting)    RBD (REM behavioral disorder)    Rheumatoid arthritis(714.0)    Scoliosis of lumbar  spine    Spinal stenosis    Multiple levels   T[place]    Trigeminal neuralgia 03/30/2015   Vision abnormalities     Past Surgical History:  Past Surgical History:  Procedure Laterality Date   ABDOMINAL HYSTERECTOMY     AUGMENTATION MAMMAPLASTY Bilateral    COLONOSCOPY     IR THORACENTESIS ASP PLEURAL SPACE W/IMG GUIDE  07/26/2023   JOINT REPLACEMENT     right uni knee, right hip with 2 revisions   KIDNEY STONE SURGERY Bilateral    KNEE ARTHROTOMY Right 02/27/2018   Procedure: RIGHT KNEE ARTHROTOMY; scar excision;  Surgeon: Ollen Gross, MD;  Location: [facility];  Service: Orthopedics;  Laterality: Right;   MOHS SURGERY     RIGHT/LEFT HEART CATH AND CORONARY ANGIOGRAPHY N/A 07/30/2023   Procedure: RIGHT/LEFT HEART CATH AND CORONARY ANGIOGRAPHY;  Surgeon: Kathleene Hazel, MD;  Location: [facility];  Service: Cardiovascular;  Laterality: N/A;   TONSILLECTOMY     TOTAL HIP ARTHROPLASTY     TOTAL KNEE ARTHROPLASTY  01/22/2013   Procedure: TOTAL KNEE ARTHROPLASTY;  Surgeon: Loanne Drilling, MD;  Location: [facility];  Service: Orthopedics;  Laterality: Right;  Revision of a Right Uni Knee to a Total Knee Arthroplasty    Medication: Current Facility-Administered Medications  Medication Dose Route Frequency Provider Last Rate Last Admin   acetaminophen (TYLENOL) tablet 650 mg  650 mg Oral Q6H PRN Lurline Del, MD  amiodarone (PACERONE) tablet 200 mg  200 mg Oral Daily Skip Mayer A, MD   200 mg at 08/30/23 1031   apixaban (ELIQUIS) tablet 5 mg  5 mg Oral BID Skip Mayer A, MD   5 mg at 08/30/23 0115   cefTRIAXone (ROCEPHIN) 2 g in sodium chloride 0.9 % 100 mL IVPB  2 g Intravenous Q24H Elgergawy, Leana Roe, MD       colchicine tablet 0.6 mg  0.6 mg Oral Daily Skip Mayer A, MD   0.6 mg at 08/30/23 1031   feeding supplement (ENSURE ENLIVE / ENSURE PLUS) liquid 237 mL  237 mL Oral BID BM Lurline Del, MD       [START ON 08/31/2023] influenza  vaccine adjuvanted (FLUAD) injection 0.5 mL  0.5 mL Intramuscular Tomorrow-1000 Skip Mayer A, MD       irbesartan (AVAPRO) tablet 150 mg  150 mg Oral Daily Skip Mayer A, MD   150 mg at 08/30/23 1030   lactated ringers infusion   Intravenous Continuous Elgergawy, Leana Roe, MD 75 mL/hr at 08/30/23 1056 New Bag at 08/30/23 1056   lamoTRIgine (LAMICTAL) tablet 150 mg  150 mg Oral Daily Skip Mayer A, MD   150 mg at 08/30/23 1030   levalbuterol (XOPENEX) nebulizer solution 0.63 mg  0.63 mg Nebulization Q6H PRN Lurline Del, MD       meclizine (ANTIVERT) tablet 12.5 mg  12.5 mg Oral Q8H PRN Lurline Del, MD       metoprolol tartrate (LOPRESSOR) tablet 25 mg  25 mg Oral BID Skip Mayer A, MD   25 mg at 08/30/23 1030   montelukast (SINGULAIR) tablet 10 mg  10 mg Oral QHS Skip Mayer A, MD   10 mg at 08/30/23 0115   ondansetron (ZOFRAN) tablet 4 mg  4 mg Oral Q6H PRN Lurline Del, MD       Or   ondansetron [facility]) injection 4 mg  4 mg Intravenous Q6H PRN Lurline Del, MD       Oxcarbazepine (TRILEPTAL) tablet 300 mg  300 mg Oral BID Skip Mayer A, MD   300 mg at 08/30/23 1031   pantoprazole (PROTONIX) EC tablet 40 mg  40 mg Oral Daily Skip Mayer A, MD   40 mg at 08/30/23 1030   polyethylene glycol (MIRALAX / GLYCOLAX) packet 17 g  17 g Oral Daily PRN Lurline Del, MD       pravastatin (PRAVACHOL) tablet 40 mg  40 mg Oral Daily Skip Mayer A, MD   40 mg at 08/30/23 1030   pyridostigmine (MESTINON) tablet 60 mg  60 mg Oral Q8H Skip Mayer A, MD   60 mg at 08/30/23 1030   sertraline (ZOLOFT) tablet 50 mg  50 mg Oral Daily Skip Mayer A, MD   50 mg at 08/30/23 1030   spironolactone (ALDACTONE) tablet 12.5 mg  12.5 mg Oral Daily Skip Mayer A, MD   12.5 mg at 08/30/23 1030    Allergies: Allergies  Allergen Reactions   Morphine And Codeine Hives   Prevacid [Lansoprazole] Other (See Comments)    unknown    Provigil [Modafinil] Other (See Comments)    dizziness   Adhesive [Tape] Rash    bandaids     Social History: Social History   Tobacco Use   Smoking status: Never   Smokeless tobacco: Never  Vaping Use   Vaping status: Never Used  Substance Use Topics   Alcohol use: Yes  Alcohol/week: 0.0 standard drinks of alcohol    Comment: occasional   Drug use: No    Family History Family History  Problem Relation Age of Onset   Heart disease Mother    Parkinson's disease Father     Review of Systems  Unable to perform ROS: Dementia     Objective   Vital signs in last 24 hours: BP 107/65 (BP Location: Right Arm)   Pulse 62   Temp 97.6 F (36.4 C) (Axillary)   Resp 20   Ht 5\' 10"  (1.778 m)   Wt 55.5 kg   SpO2 98%   BMI 17.56 kg/m   Physical Exam General: NAD, A&O, resting, appropriate HEENT: [place]/AT Pulmonary: Normal work of breathing Cardiovascular: RRR, no cyanosis   Most Recent Labs: Lab Results  Component Value Date   WBC 6.5 08/30/2023   HGB 9.9 (L) 08/30/2023   HCT 30.6 (L) 08/30/2023   PLT 220 08/30/2023    Lab Results  Component Value Date   NA 131 (L) 08/30/2023   K 3.9 08/30/2023   CL 100 08/30/2023   CO2 21 (L) 08/30/2023   BUN 18 08/30/2023   CREATININE 1.06 (H) 08/30/2023   CALCIUM 8.0 (L) 08/30/2023   MG 2.1 08/06/2023   PHOS 2.7 08/06/2023    Lab Results  Component Value Date   INR 1.2 07/25/2023   APTT 30 02/20/2018     Urine Culture: @[id number] (laburin,org,[id number])@   IMAGING: DG Chest Port 1 View  Result Date: 08/29/2023 CLINICAL DATA:  Altered mental status. EXAM: PORTABLE CHEST 1 VIEW COMPARISON:  08/07/2023, CT 07/31/2023 FINDINGS: Normal heart size and mediastinal contours. Mild blunting of the right costophrenic angle likely related to scarring a possible trace pleural effusion. No confluent consolidation or pneumothorax. Normal pulmonary vasculature. IMPRESSION: Mild blunting of the right costophrenic angle  likely related to scarring a possible trace pleural effusion. Electronically Signed   By: Narda Rutherford M.D.   On: 08/29/2023 20:49   CT HEAD WO CONTRAST ( )  Result Date: 08/29/2023 CLINICAL DATA:  Mental status change. EXAM: CT HEAD WITHOUT CONTRAST TECHNIQUE: Contiguous axial images were obtained from the base of the skull through the vertex without intravenous contrast. RADIATION DOSE REDUCTION: This exam was performed according to the departmental dose-optimization program which includes automated exposure control, adjustment of the mA and/or kV according to patient size and/or use of iterative reconstruction technique. COMPARISON:  MRI and CT scan head from 08/06/2023. FINDINGS: Brain: No evidence of acute infarction, hemorrhage, hydrocephalus, extra-axial collection or mass lesion/mass effect. 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 There is bilateral periventricular hypodensity, which is non-specific but most likely seen in the settings of microvascular ischemic changes. Mild in extent. Vascular: No hyperdense vessel or unexpected calcification. Intracranial arteriosclerosis. Skull: Normal. Negative for fracture or focal lesion. Sinuses/Orbits: No acute finding. Other: There are soft tissue attenuation areas in bilateral external auditory canal, likely cerumen. IMPRESSION: No acute intracranial abnormality. Electronically Signed   By: Jules Schick M.D.   On: 08/29/2023 20:19   CT Renal Stone Study  Result Date: 08/29/2023 CLINICAL DATA:  Abdominal/flank pain, stone suspected EXAM: CT ABDOMEN AND PELVIS WITHOUT CONTRAST TECHNIQUE: Multidetector CT imaging of the abdomen and pelvis was performed following the standard protocol without IV contrast. RADIATION DOSE REDUCTION: This exam was performed according to the departmental dose-optimization program which includes automated exposure control, adjustment of the mA and/or kV according to patient size and/or use of  iterative reconstruction technique. COMPARISON:  CT scan abdomen and pelvis report from 11/14/2016. FINDINGS: Lower chest: The lung bases are clear. At least small right pleural effusion, partially imaged. No left pleural effusion. The heart is normal in size. No pericardial effusion. Partially seen bilateral breast implants. Hepatobiliary: The liver is normal in size. Non-cirrhotic configuration. No suspicious mass. No intrahepatic or extrahepatic bile duct dilation. No calcified gallstones. Normal gallbladder wall thickness. No pericholecystic inflammatory changes. Pancreas: Unremarkable. No pancreatic ductal dilatation or surrounding inflammatory changes. Spleen: Within normal limits. No focal lesion. Adrenals/Urinary Tract: Adrenal glands are unremarkable. No suspicious renal mass seen within the limitations of this unenhanced exam. There is a 1.2 x 1.7 cm calculus in the left pelviureteric junction causing moderate hydronephrosis. There are additional multiple subcentimeter sized calculi in the left renal collecting system. Left ureter is nondilated. No right nephroureterolithiasis or obstructive uropathy. 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 Urinary bladder is not diagnostically evaluated due to extensive streak artifacts. Stomach/Bowel: No disproportionate dilation of the small or large bowel loops. No evidence of abnormal bowel wall thickening or inflammatory changes. The appendix was not visualized; however there is no acute inflammatory process in the right lower quadrant. Vascular/Lymphatic: No ascites or pneumoperitoneum. No abdominal or pelvic lymphadenopathy, by size criteria. No aneurysmal dilation of the major abdominal arteries. There are mild peripheral atherosclerotic vascular calcifications of the aorta and its major branches. Reproductive: Not diagnostically evaluated due to extensive streak artifacts. Other: The  visualized soft tissues and abdominal wall are unremarkable. Musculoskeletal: No suspicious osseous lesions. There are moderate multilevel degenerative changes in the visualized spine. IMPRESSION: 1. There is a 1.7 cm left pelvic ureteric junction calculus causing moderate hydronephrosis. There are additional several subcentimeter sized calculi in the left kidney. 2. No right nephroureterolithiasis or obstructive uropathy. 3. Multiple other nonacute observations, as described above. Electronically Signed   By: Jules Schick M.D.   On: 08/29/2023 20:15    ------  Elmon Kirschner, NP Pager: [phone]   Please contact the urology consult pager with any further questions/concerns.

## 2023-08-30 NOTE — Progress Notes (Signed)
PROGRESS NOTE    Kristen Hansen  GNF:[id number] DOB: 1946/08/01 DOA: 08/29/2023 PCP: Kerin Salen, PA-C   Chief Complaint  Patient presents with   Altered Mental Status    Brief Narrative:   Kristen Hansen is a 77 y.o. female with medical history significant of PMH of RA, lewy-body dementia, REM sleep d/o, prior DVT s/p treatment, Sjogren's syndrome, chronic diastolic CHF, orthostatic hypotension,CKDIII,GERD, hyperthyroidism,mitral stenosis and hypertension. Patient also has interim history of diagnosis of  pericarditis, Paroxsymal,atrial fibrillation, right pleural effusion s/p throacentesis/chest tube, during recent hospitalization 7//31-8/14. Patient now presents to ED due to increased confusion, her workup significant for UTI, she is admitted for further workup.     Assessment & Plan:   Principal Problem:   UTI (urinary tract infection)    E. coli UTI, infected ureteral stone and bacteremia -Blood culture growing E. coli, follow urine cultures, imaging CT abdomen pelvis showing 12.5 x 1.7 obstructing left U PJ stone, -Urology input greatly appreciated, 2 OR for left ureteral stent placement '-Continue with IV fluids -Continue with as needed pain medications -Continue with IV Rocephin   Acute Metabolic Encephalopathy on baseline demention - due to acute infection  -treat underlying cause   -CTH NAD    Hx Pericarditis  (diagnosed hospitalization 7//31-8/14) Hx of Exudative pleural effusion ( now resolved) -presumed due to Autoimmune cause with hx of RA - continue on colchicine 0.6 Mg p.o. daily , with plans to complete 3 mo course   Paroxsymal Atrial fibrillation -continue on amiodarone, metoprolol, DOAC   CHF pef  - no acute exacerbation  -continue with metoprolol/lasix, arb ,spironolactone -recent cardiac cath clean c's   Hx of Dysphagia  - thought be related to Lewy body dementia progression -must maintain aspiration precautions    Lewy body  dementia  -follows with neurology out patient  -On pyridostigmine    Orthostatic hypotension -continue with ted hose   Hyperthyrodism -noted elevated tsh  -hold tapazole for now  f/u on t3/t4 and titrate med as needed   Hypertension -stable bp  - continue  home regimen    HLD -continue with statin   Hx of DVT -s/p treatment  -no dvt noted no last dvt doppler studies   CKDIII - cr at baseline    GERD -ppi     Rheumatoid Arthritis/ Sjogren's syndrome  -Resume home regimen as able    Chronic Back pain /spinal stenosis lumbar region -supportive care    Depression - continue on SSRi   Trigeminal neuralgia  - continue on lamictal and trileptal   DVT prophylaxis: Held Eliquis this morning will see patient for surgery afternoon Code Status: DNR Family Communication: Discussed with husband at bedside Disposition:   Status is: Inpatient    Consultants:  Palliative medicine  Cardiology  Urology    Subjective:  No significant events overnight as discussed with staff, patient herself unable to provide any complaints Objective: Vitals:   08/30/23 0400 08/30/23 0500 08/30/23 0810 08/30/23 1200  BP: 107/65   128/63  Pulse: 62   66  Resp: 16  20 12   Temp: 97.6 F (36.4 C)   98.6 F (37 C)  TempSrc: Axillary  Oral Oral  SpO2: 98%   97%  Weight:  55.5 kg    Height:       No intake or output data in the 24 hours ending 08/30/23 1418 Filed Weights   08/29/23 1855 08/30/23 0500  Weight: 56 kg 55.5 kg    Examination:  Awake Alert, Oriented X 2, tremulous frail, chronically ill-appearing Symmetrical Chest wall movement, Good air movement bilaterally, CTAB RRR,No Gallops,Rubs or new Murmurs, No Parasternal Heave +ve B.Sounds, Abd Soft, No tenderness, No rebound - guarding or rigidity. No Cyanosis, Clubbing or edema, No new Rash or bruise       Data Reviewed: I have personally reviewed following labs and imaging studies  CBC: Recent Labs  Lab  08/29/23 1916 08/30/23 0313  WBC 6.7 6.5  NEUTROABS 5.2  --   HGB 11.0* 9.9*  HCT 35.1* 30.6*  MCV 85.0 81.6  PLT 261 220    Basic Metabolic Panel: Recent Labs  Lab 08/29/23 1916 08/30/23 0313  NA 129* 131*  K 3.9 3.9  CL 97* 100  CO2 19* 21*  GLUCOSE 109* 88  BUN 20 18  CREATININE 0.98 1.06*  CALCIUM 8.2* 8.0*    GFR: Estimated Creatinine Clearance: 39.6 mL/min (A) (by C-G formula based on SCr of 1.06 mg/dL (H)).  Liver Function Tests: Recent Labs  Lab 08/29/23 1916 08/30/23 0313  AST 24 20  ALT 25 22  ALKPHOS 120 112  BILITOT 0.6 0.4  PROT 5.2* 4.7*  ALBUMIN 2.2* 2.0*    CBG: No results for input(s): "GLUCAP" in the last 168 hours.   Recent Results (from the past 240 hour(s))  SARS Coronavirus 2 by RT PCR (hospital order, performed in [facility] hospital lab) *cepheid single result test* Anterior Nasal Swab     Status: None   Collection Time: 08/29/23  7:17 PM   Specimen: Anterior Nasal Swab  Result Value Ref Range Status   SARS Coronavirus 2 by RT PCR NEGATIVE NEGATIVE Final    Comment: Performed at [facility], [address], [place], [place] [address]  Urine Culture     Status: None (Preliminary result)   Collection Time: 08/29/23  8:12 PM   Specimen: In/Out Cath Urine  Result Value Ref Range Status   Specimen Description IN/OUT CATH URINE  Final   Special Requests   Final    NONE Reflexed from [id number] Performed at [facility], [address] [place], [place] [address]    Culture PENDING  Incomplete   Report Status PENDING  Incomplete  Blood culture (routine x 2)     Status: None (Preliminary result)   Collection Time: 08/29/23 10:33 PM   Specimen: BLOOD LEFT ARM  Result Value Ref Range Status   Specimen Description BLOOD LEFT ARM  Final   Special Requests   Final    BOTTLES DRAWN AEROBIC AND ANAEROBIC Blood Culture adequate volume   Culture  Setup Time   Final    GRAM NEGATIVE RODS IN BOTH AEROBIC AND ANAEROBIC  BOTTLES CRITICAL RESULT CALLED TO, READ BACK BY AND VERIFIED WITH: Gwendolyn Fill AGEE [phone] [id number] FCP Performed at [facility], [address], [place], [place] [address]    Culture GRAM NEGATIVE RODS  Final   Report Status PENDING  Incomplete  Blood culture (routine x 2)     Status: None (Preliminary result)   Collection Time: 08/29/23 10:33 PM   Specimen: BLOOD RIGHT FOREARM  Result Value Ref Range Status   Specimen Description BLOOD RIGHT FOREARM  Final   Special Requests   Final    BOTTLES DRAWN AEROBIC AND ANAEROBIC Blood Culture adequate volume   Culture  Setup Time   Final    GRAM NEGATIVE RODS IN BOTH AEROBIC AND ANAEROBIC BOTTLES Performed at [facility],  [address], [place], [place] [address]    Culture GRAM NEGATIVE RODS  Final   Report Status PENDING  Incomplete  Blood Culture ID Panel (Reflexed)     Status: Abnormal   Collection Time: 08/29/23 10:33 PM  Result Value Ref Range Status   Enterococcus faecalis NOT DETECTED NOT DETECTED Final   Enterococcus Faecium NOT DETECTED NOT DETECTED Final   Listeria monocytogenes NOT DETECTED NOT DETECTED Final   Staphylococcus species NOT DETECTED NOT DETECTED Final   Staphylococcus aureus (BCID) NOT DETECTED NOT DETECTED Final   Staphylococcus epidermidis NOT DETECTED NOT DETECTED Final   Staphylococcus lugdunensis NOT DETECTED NOT DETECTED Final   Streptococcus species NOT DETECTED NOT DETECTED Final   Streptococcus agalactiae NOT DETECTED NOT DETECTED Final   Streptococcus pneumoniae NOT DETECTED NOT DETECTED Final   Streptococcus pyogenes NOT DETECTED NOT DETECTED Final   A.calcoaceticus-baumannii NOT DETECTED NOT DETECTED Final   Bacteroides fragilis NOT DETECTED NOT DETECTED Final   Enterobacterales DETECTED (A) NOT DETECTED Final    Comment: Enterobacterales represent a large order of gram negative bacteria, not a single organism. CRITICAL RESULT CALLED TO, READ BACK BY AND VERIFIED WITH: PHARMD BAILEY  AGEE [id number] FCP    Enterobacter cloacae complex NOT DETECTED NOT DETECTED Final   Escherichia coli DETECTED (A) NOT DETECTED Final    Comment: CRITICAL RESULT CALLED TO, READ BACK BY AND VERIFIED WITH: PHARMD BAILEY AGEE [phone] FCP    Klebsiella aerogenes NOT DETECTED NOT DETECTED Final   Klebsiella oxytoca NOT DETECTED NOT DETECTED Final   Klebsiella pneumoniae NOT DETECTED NOT DETECTED Final   Proteus species NOT DETECTED NOT DETECTED Final   Salmonella species NOT DETECTED NOT DETECTED Final   Serratia marcescens NOT DETECTED NOT DETECTED Final   Haemophilus influenzae NOT DETECTED NOT DETECTED Final   Neisseria meningitidis NOT DETECTED NOT DETECTED Final   Pseudomonas aeruginosa NOT DETECTED NOT DETECTED Final   Stenotrophomonas maltophilia NOT DETECTED NOT DETECTED Final   Candida albicans NOT DETECTED NOT DETECTED Final   Candida auris NOT DETECTED NOT DETECTED Final   Candida glabrata NOT DETECTED NOT DETECTED Final   Candida krusei NOT DETECTED NOT DETECTED Final   Candida parapsilosis NOT DETECTED NOT DETECTED Final   Candida tropicalis NOT DETECTED NOT DETECTED Final   Cryptococcus neoformans/gattii NOT DETECTED NOT DETECTED Final   CTX-M ESBL NOT DETECTED NOT DETECTED Final   Carbapenem resistance IMP NOT DETECTED NOT DETECTED Final   Carbapenem resistance KPC NOT DETECTED NOT DETECTED Final   Carbapenem resistance NDM NOT DETECTED NOT DETECTED Final   Carbapenem resist OXA 48 LIKE NOT DETECTED NOT DETECTED Final   Carbapenem resistance VIM NOT DETECTED NOT DETECTED Final    Comment: Performed at [facility], [address] [place], [place] [address]         Radiology Studies: DG Chest Port 1 View  Result Date: 08/29/2023 CLINICAL DATA:  Altered mental status. EXAM: PORTABLE CHEST 1 VIEW COMPARISON:  08/07/2023, CT 07/31/2023 FINDINGS: Normal heart size and mediastinal contours. Mild blunting of the right costophrenic angle likely related to  scarring a possible trace pleural effusion. No confluent consolidation or pneumothorax. Normal pulmonary vasculature. IMPRESSION: Mild blunting of the right costophrenic angle likely related to scarring a possible trace pleural effusion. Electronically Signed   By: Narda Rutherford M.D.   On: 08/29/2023 20:49   CT HEAD WO CONTRAST ( )  Result Date: 08/29/2023 CLINICAL DATA:  Mental status change. EXAM: CT HEAD WITHOUT CONTRAST TECHNIQUE:  Contiguous axial images were obtained from the base of the skull through the vertex without intravenous contrast. RADIATION DOSE REDUCTION: This exam was performed according to the departmental dose-optimization program which includes automated exposure control, adjustment of the mA and/or kV according to patient size and/or use of iterative reconstruction technique. COMPARISON:  MRI and CT scan head from 08/06/2023. FINDINGS: Brain: No evidence of acute infarction, hemorrhage, hydrocephalus, extra-axial collection or mass lesion/mass effect. 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 There is bilateral periventricular hypodensity, which is non-specific but most likely seen in the settings of microvascular ischemic changes. Mild in extent. Vascular: No hyperdense vessel or unexpected calcification. Intracranial arteriosclerosis. Skull: Normal. Negative for fracture or focal lesion. Sinuses/Orbits: No acute finding. Other: There are soft tissue attenuation areas in bilateral external auditory canal, likely cerumen. IMPRESSION: No acute intracranial abnormality. Electronically Signed   By: Jules Schick M.D.   On: 08/29/2023 20:19   CT Renal Stone Study  Result Date: 08/29/2023 CLINICAL DATA:  Abdominal/flank pain, stone suspected EXAM: CT ABDOMEN AND PELVIS WITHOUT CONTRAST TECHNIQUE: Multidetector CT imaging of the abdomen and pelvis was performed following the standard protocol without IV contrast. RADIATION DOSE REDUCTION: This exam was  performed according to the departmental dose-optimization program which includes automated exposure control, adjustment of the mA and/or kV according to patient size and/or use of iterative reconstruction technique. COMPARISON:  CT scan abdomen and pelvis report from 11/14/2016. FINDINGS: Lower chest: The lung bases are clear. At least small right pleural effusion, partially imaged. No left pleural effusion. The heart is normal in size. No pericardial effusion. Partially seen bilateral breast implants. Hepatobiliary: The liver is normal in size. Non-cirrhotic configuration. No suspicious mass. No intrahepatic or extrahepatic bile duct dilation. No calcified gallstones. Normal gallbladder wall thickness. No pericholecystic inflammatory changes. Pancreas: Unremarkable. No pancreatic ductal dilatation or surrounding inflammatory changes. Spleen: Within normal limits. No focal lesion. Adrenals/Urinary Tract: Adrenal glands are unremarkable. No suspicious renal mass seen within the limitations of this unenhanced exam. There is a 1.2 x 1.7 cm calculus in the left pelviureteric junction causing moderate hydronephrosis. There are additional multiple subcentimeter sized calculi in the left renal collecting system. Left ureter is nondilated. No right nephroureterolithiasis or obstructive uropathy. 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 Urinary bladder is not diagnostically evaluated due to extensive streak artifacts. Stomach/Bowel: No disproportionate dilation of the small or large bowel loops. No evidence of abnormal bowel wall thickening or inflammatory changes. The appendix was not visualized; however there is no acute inflammatory process in the right lower quadrant. Vascular/Lymphatic: No ascites or pneumoperitoneum. No abdominal or pelvic lymphadenopathy, by size criteria. No aneurysmal dilation of the major abdominal arteries. There are  mild peripheral atherosclerotic vascular calcifications of the aorta and its major branches. Reproductive: Not diagnostically evaluated due to extensive streak artifacts. Other: The visualized soft tissues and abdominal wall are unremarkable. Musculoskeletal: No suspicious osseous lesions. There are moderate multilevel degenerative changes in the visualized spine. IMPRESSION: 1. There is a 1.7 cm left pelvic ureteric junction calculus causing moderate hydronephrosis. There are additional several subcentimeter sized calculi in the left kidney. 2. No right nephroureterolithiasis or obstructive uropathy. 3. Multiple other nonacute observations, as described above. Electronically Signed   By: Jules Schick M.D.   On: 08/29/2023 20:15        Scheduled Meds:  amiodarone  200 mg Oral Daily   apixaban  5  mg Oral BID   colchicine  0.6 mg Oral Daily   feeding supplement  237 mL Oral BID BM   [START ON 08/31/2023] influenza vaccine adjuvanted  0.5 mL Intramuscular Tomorrow-1000   irbesartan  150 mg Oral Daily   lamoTRIgine  150 mg Oral Daily   metoprolol tartrate  25 mg Oral BID   montelukast  10 mg Oral QHS   Oxcarbazepine  300 mg Oral BID   pantoprazole  40 mg Oral Daily   pravastatin  40 mg Oral Daily   pyridostigmine  60 mg Oral Q8H   sertraline  50 mg Oral Daily   spironolactone  12.5 mg Oral Daily   Continuous Infusions:  cefTRIAXone (ROCEPHIN)  IV 2 g (08/30/23 1338)   lactated ringers 75 mL/hr at 08/30/23 1056     LOS: 1 day      Huey Bienenstock, MD [facility]   To contact the attending provider between 7A-7P or the covering provider during after hours 7P-7A, please log into the web site [email or web address] and access using universal [place] password for that web site. If you do not have the password, please call the hospital operator.  08/30/2023, 2:18 PM

## 2023-08-30 NOTE — TOC Initial Note (Signed)
Transition of Care [facility]) - Initial/Assessment Note    Patient Details  Name: Kristen Hansen MRN: [id number] Date of Birth: [birth year]  Transition of [facility]) CM/SW Contact:    Mearl Latin, LCSW Phone Number: 08/30/2023, 2:54 PM  Clinical Narrative:                 Patient arrived from [facility] for rehab and was planned for discharge last Saturday. Will need insurance auth to return.    Barriers to Discharge: Continued Medical Work up, English as a second language teacher   Patient Goals and CMS Choice            Expected Discharge Plan and Services In-house Referral: Clinical Social Work     Living arrangements for the past 2 months: Skilled Holiday representative, Single Family Home                                      Prior Living Arrangements/Services Living arrangements for the past 2 months: Skilled Nursing Facility, Single Family Home Lives with:: Spouse                   Activities of Daily Living Home Assistive Devices/Equipment: None ADL Screening (condition at time of admission) Patient's cognitive ability adequate to safely complete daily activities?: Yes Is the patient deaf or have difficulty hearing?: No Does the patient have difficulty seeing, even when wearing glasses/contacts?: No Does the patient have difficulty concentrating, remembering, or making decisions?: Yes Patient able to express need for assistance with ADLs?: Yes Does the patient have difficulty dressing or bathing?: Yes Independently performs ADLs?: No Communication: Independent Dressing (OT): Needs assistance Is this a change from baseline?: Pre-admission baseline Grooming: Needs assistance Is this a change from baseline?: Pre-admission baseline Feeding: Independent Bathing: Needs assistance Is this a change from baseline?: Pre-admission baseline Toileting: Needs assistance Is this a change from baseline?: Pre-admission baseline In/Out Bed: Needs assistance Is this a change  from baseline?: Pre-admission baseline Does the patient have difficulty walking or climbing stairs?: Yes Weakness of Legs: Both Weakness of Arms/Hands: Both  Permission Sought/Granted                  Emotional Assessment Appearance:: Appears stated age Attitude/Demeanor/Rapport: Unable to Assess Affect (typically observed): Unable to Assess Orientation: : Oriented to Self, Oriented to Place Alcohol / Substance Use: Not Applicable Psych Involvement: No (comment)  Admission diagnosis:  Kidney stone [N20.0] UTI (urinary tract infection) [N39.0] Acute pyelonephritis [N10] Patient Active Problem List   Diagnosis Date Noted   UTI (urinary tract infection) 08/29/2023   PAF (paroxysmal atrial fibrillation) (HCC) 07/30/2023   Need for management of chest tube 07/30/2023   Acute idiopathic pericarditis 07/26/2023   Pleural effusion 07/25/2023   Chest pain 07/25/2023   Sjogren's syndrome (HCC) 07/25/2023   Hyperthyroidism 06/07/2023   Lewy body dementia without behavioral disturbance, psychotic disturbance, mood disturbance, or anxiety (HCC) 06/08/2022   Spinal stenosis of lumbar region 06/08/2022   Gastroesophageal reflux disease with esophagitis 04/19/2022   Speech disturbance 12/06/2021   Depression 12/06/2021   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) 09/22/2021   Mixed hyperlipidemia 08/23/2021   Other peripheral vertigo, unspecified ear 01/20/2021   Chronic heart failure with preserved ejection fraction (HCC) 11/08/2020   History of fusion of cervical spine 09/27/2020   Transient alteration of awareness 09/27/2020   Orthostatic hypotension 09/27/2020   Cervical  radiculopathy 01/06/2020   Lumbar radiculopathy 01/06/2020   CKD (chronic kidney disease) stage 3, GFR 30-59 ml/min (HCC) 07/21/2019   Numbness 05/26/2019   Chronic bilateral low back pain without sciatica 09/05/2018   Arthrofibrosis of knee joint, right 02/27/2018   Facet syndrome 04/30/2017    Excessive sleepiness 10/31/2016   Avitaminosis D 03/30/2016   Idiopathic peripheral neuropathy 02/23/2016   HTN (hypertension) 02/22/2016   Other fatigue 10/20/2015   Trigeminal neuralgia 03/30/2015   REM behavioral disorder 03/30/2015   Polypharmacy 09/11/2014   Other long term (current) drug therapy 09/11/2014   Arthritis or polyarthritis, rheumatoid (HCC) 05/14/2014   Generalized OA 05/14/2014   Rheumatoid arthritis (HCC) 05/14/2014   Postop Hyponatremia 01/23/2013   Postoperative anemia due to acute blood loss 01/23/2013   Pain due to unicompartmental arthroplasty of knee (HCC) 01/22/2013   Pain due to knee joint prosthesis (HCC) 01/22/2013   Mucositis oral 08/20/2012   PCP:  Kerin Salen, PA-C Pharmacy:   [facility] [address] [place] - [address] [place] [address] Phone: [phone] Fax: [phone]     Social Determinants of Health (SDOH) Social History: SDOH Screenings   Food Insecurity: No Food Insecurity (08/30/2023)  Housing: Low Risk  (08/30/2023)  Transportation Needs: No Transportation Needs (08/30/2023)  Utilities: Not At Risk (08/30/2023)  Financial Resource Strain: Low Risk  (08/22/2022)   [facility] visits prior to 02/24/2023.  Physical Activity: Inactive (08/22/2022)   [facility] prior to 02/24/2023., [facility] visits prior to 02/24/2023.  Social Connections: Socially Integrated (08/22/2022)   [facility], [facility] prior to 02/24/2023., [facility] visits prior to 02/24/2023.  Stress: No Stress Concern Present (08/22/2022)   [facility]  prior to 02/24/2023., [facility] visits prior to 02/24/2023.  Tobacco Use: Low Risk  (08/30/2023)   SDOH Interventions:     Readmission Risk Interventions    08/08/2023    3:02 PM  Readmission Risk Prevention Plan  Transportation Screening Complete  PCP or Specialist Appt within 3-5 Days Complete  Medication Review (RN Care Manager) Complete

## 2023-08-30 NOTE — Progress Notes (Signed)
CCC Pre-op Review  Pre-op checklist:  will request floor RN to complete  NPO:   Labs:  See labs  Consent:  Dr. Gildardo Griffes Diarmid has not seen patient and no consent order  H&P:  H&P from Hospitalist, no H&P from Dr. Sherron Monday  Vitals:  stable  O2 requirements:  Room Air  MAR/PTA review:     IV:  20 Right AC  Floor nurse name:  Natale Milch, RN  Additional info:  From [facility].  Has lewy-body dementia, A/O x 1 in ED

## 2023-08-30 NOTE — Op Note (Signed)
preOperative diagnosis: Left ureteral stone and urinary tract infection Postoperative diagnosis: Left ureteral stone and urinary tract infection Surgery: Cystoscopy left retrograde ureterogram and insertion of left ureteral stent Surgeon: Dr. Lorin Picket Datrell Dunton  The patient has the above diagnosis and consented above procedure.  Preoperative antibiotics given.  Patient was stable but she actually had positive blood cultures and negative urine culture  Care taken with leg positioning especially due to rigidity and hip issues  21 [place] scope was utilized.  Urine was very cloudy and irrigation until clear.  She had mild bladder erythema.  Otherwise bladder mucosa normal.  Ureteral orifice on the left was a bit flat but I was able to negotiate with the opening ureteral catheter and then the sensor wire.  I passed a sensor wire to the mid left ureter.  Open-ended ureteral catheter passed without location.  Wire removed.  I did a gentle retrograde with 5 cc of contrast.  I could see the large stones in her renal pelvis and ureteropelvic junction.  She had dilated calyces.  Sensor wire was passed curling the upper pole calyx.  Open-ended ureteral catheter was removed.  I passed a 26 cm x 6 French double-J stent without the string curling in the upper pole calyx and in the bladder.  She had a mild volcano effect.  The procedure went very well.  There was issues with the scope initially.  Foley catheter 14 French inserted the end of the case.  X-rays were taken.

## 2023-08-30 NOTE — Progress Notes (Signed)
CCC Pre-op Review  Pre-op checklist:  Will request floor RN to complete  NPO:   Labs:  see labs  Consent:  No orders for consent, Dr. Sherron Monday has not seen patient  H&P:  Hospitalist H&P, no surgeon H&P  Vitals:  Stable  O2 requirements:  Room Air  MAR/PTA review:  Eliquis LD yesterday  IV:  20G right AC  Floor nurse name:   Marchelle Folks, RN  Additional info:  from [facility]  lewy-body dementia

## 2023-08-30 NOTE — Anesthesia Procedure Notes (Signed)
Procedure Name: LMA Insertion Date/Time: 08/30/2023 4:25 PM  Performed by: Georgianne Fick D, CRNAPre-anesthesia Checklist: Patient identified, Emergency Drugs available, Suction available and Patient being monitored Patient Re-evaluated:Patient Re-evaluated prior to induction Oxygen Delivery Method: Circle System Utilized Preoxygenation: Pre-oxygenation with 100% oxygen Induction Type: IV induction Ventilation: Mask ventilation without difficulty LMA: LMA inserted LMA Size: 4.0 Number of attempts: 1 Airway Equipment and Method: Bite block Placement Confirmation: positive ETCO2 Tube secured with: Tape Dental Injury: Teeth and Oropharynx as per pre-operative assessment

## 2023-08-30 NOTE — Transfer of Care (Signed)
Immediate Anesthesia Transfer of Care Note  Patient: Niquita Kostelecky Mysliwiec  Procedure(s) Performed: CYSTOSCOPY WITH RETROGRADE PYELOGRAM/ LEFT URETERAL STENT PLACEMENT (Left)  Patient Location: PACU  Anesthesia Type:General  Level of Consciousness: drowsy, patient cooperative, and responds to stimulation  Airway & Oxygen Therapy: Patient Spontanous Breathing and Patient connected to nasal cannula oxygen  Post-op Assessment: Report given to RN and Post -op Vital signs reviewed and stable  Post vital signs: Reviewed and stable  Last Vitals:  Vitals Value Taken Time  BP 123/59 08/30/23 1701  Temp 36.1 C 08/30/23 1700  Pulse 69 08/30/23 1703  Resp 10 08/30/23 1703  SpO2 100 % 08/30/23 1703  Vitals shown include unfiled device data.  Last Pain:  Vitals:   08/30/23 1536  TempSrc: Oral  PainSc: 0-No pain         Complications: No notable events documented.

## 2023-08-30 NOTE — Discharge Instructions (Signed)

## 2023-08-30 NOTE — Consult Note (Signed)
Consultation Note Date: 08/30/2023   Patient Name: Kristen Hansen  DOB: Mar 17, 1946  MRN: [id number]  Age / Sex: 77 y.o., female  PCP: Kerin Salen, PA-C Referring Physician: Starleen Arms, MD  Reason for Consultation: Establishing goals of care  HPI/Patient Profile: 77 y.o. female  with past medical history of RA, lewy-body dementia, REM sleep d/o, prior DVT s/p treatment, Sjogren's syndrome, chronic diastolic CHF, orthostatic hypotension, CKDIII, GERD, hyperthyroidism,mitral stenosis and hypertension, trigeminal neuralgia, depression, chronic back pain from spinal stenosis of the lumbar region, admitted on 08/29/2023 with altered mental status.   Patient was recently hospitalized from 7/31 to 8/14 with pericarditis, atrial fibrillation, right pleural effusion s/p throacentesis/chest tube.  Workup in the ED showed UTI with concern for infected renal stone in the left UPJ.  Hospitalization further complicated by bacteremia.  Plan for OR for left ureteral stent placement. PMT has been consulted to assist with goals of care conversation.  Clinical Assessment and Goals of Care:  I have reviewed medical records including EPIC notes, labs and imaging, discussed with RN, assessed the patient and then met at the bedside with patient's husband and daughter to discuss diagnosis prognosis, GOC, EOL wishes, disposition and options.  I introduced Palliative Medicine as specialized medical care for people living with serious illness. It focuses on providing relief from the symptoms and stress of a serious illness. The goal is to improve quality of life for both the patient and the family.  We discussed a brief life review of the patient and then focused on their current illness.  The natural disease trajectory and expectations at EOL were discussed.  I attempted to elicit values and goals of care important to the  patient.    Medical History Review and Understanding:  We reviewed patient's acute illness, recent hospitalizations, and overall decline due to chronic comorbidities.  Reviewed options for treatment including stent placement and patient/family's rationale for proceeding.  Social History: Patient is married with 1 daughter, 4 grandchildren.  Family has been looking into private caregivers with [facility], who were expected to start in-home support this upcoming Saturday.  She was at [facility] prior to admission.  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  Functional and Nutritional State: Patient has not driven for the past 3 to 4 years.  Ongoing decline began around 2021.  Albumin of 2.0 noted.  Palliative Symptoms: None at this time  Advance Directives: A detailed discussion regarding advanced directives was had.  Patient's husband reports he is healthcare and financial POA.  No documentation currently on file.  Code Status: In concepts specific to code status, artifical feeding and hydration, and rehospitalization were considered and discussed.  Patient and family clarified that she has a DNR and would not want mechanical ventilation.  Patient is uncertain if she would want to be rehospitalized again.  Discussion: Patient is very dissatisfied with her current quality of life and states to her family that she is "ready to go to heaven."  She has been stating this for quite some time, particularly since her last hospitalization.  She feels that she does not have much time left to live with or without medical interventions.  She misses her friends, who are deceased, and being able to get out more like she used to.  Family shares that it has also been hard for her to not be able to interact as energetically  with her grandchildren as she used to.  They have decided to proceed with ureteral stent primarily for  comfort and relief from symptoms.   We discussed the importance of considering quality of life versus quantity of life and patient's wishes for her care.  Introduced [employer] manager of hospice given patient's dissatisfaction with her quality of life and comments regarding readiness for end-of-life.  After review of palliative versus hospice care, patient seems to be leaning towards a hospice philosophy.  She states she is not ready to die, but she does not want her life to be artificially prolonged either.  Encouraged ongoing discussions and family shared that they will support what ever she wants to do.   The difference between aggressive medical intervention and comfort care was considered in light of the patient's goals of care. Hospice and Palliative Care services outpatient were explained and offered.   Discussed the importance of continued conversation with family and the medical providers regarding overall plan of care and treatment options, ensuring decisions are within the context of the patient's values and GOCs.   Questions and concerns were addressed.  Hard Choices booklet left for review. The family was encouraged to call with questions or concerns.  PMT will continue to support holistically.   SUMMARY OF RECOMMENDATIONS   -CODE STATUS changed to DNR/DNI -Continue current care -Patient is leaning towards hospice philosophy with referral to hospice at home family is supportive.  Ongoing discussions before final decision is made -Psychosocial and emotional support provided -PMT will continue to follow and support   Prognosis:  Poor overall prognosis given recurrent acute illnesses and several chronic comorbidities, ongoing functional decline  Discharge Planning: To Be Determined      Primary Diagnoses: Present on Admission:  UTI (urinary tract infection)  Physical Exam Vitals and nursing note reviewed.  Constitutional:      General: She is not in acute distress.    Appearance:  She is ill-appearing.  Cardiovascular:     Rate and Rhythm: Normal rate.  Pulmonary:     Effort: Pulmonary effort is normal.  Neurological:     Mental Status: She is alert.  Psychiatric:        Behavior: Behavior normal.    Vital Signs: BP 107/65 (BP Location: Right Arm)   Pulse 62   Temp 97.6 F (36.4 C) (Axillary)   Resp 20   Ht 5\' 10"  (1.778 m)   Wt 55.5 kg   SpO2 98%   BMI 17.56 kg/m  Pain Scale: 0-10   Pain Score: 0-No pain   SpO2: SpO2: 98 % O2 Device:SpO2: 98 % O2 Flow Rate: .     MDM: high   Cal Gindlesperger Jeni Salles, PA-C  Palliative Medicine Team Team phone # [phone]  Thank you for allowing the Palliative Medicine Team to assist in the care of this patient. Please utilize secure chat with additional questions, if there is no response within 30 minutes please call the above phone number.  Palliative Medicine Team providers are available by phone from 7am to 7pm daily and can be reached through the team cell phone.  Should this patient require assistance outside of these hours, please call the patient's attending physician.

## 2023-08-30 NOTE — Consult Note (Signed)
Urology Consult  Referring physician: Maurine Minister Reason for referral: renal stone and UTI  Chief Complaint: Renal stone and UTI  History of Present Illness: Kristen Hansen is a 77 y.o. female with medical history significant of  PMH of RA, lewy-body dementia, REM sleep d/o, prior DVT s/p treatment, Sjogren's syndrome, chronic diastolic CHF, orthostatic hypotension,CKDIII,GERD, hyperthyroidism,mitral stenosis and hypertension. Patient also has interim history of diagnosis of  pericarditis, Paroxsymal,atrial fibrillation, right pleural effusion s/p throacentesis/chest tube, during recent hospitalization 7//31-8/14. Patient now presents to ED with complaint of change in mental status from baseline.In addition to hx of renal stone seen on recent imaging done at [facility].  Patient of note on arrival to ED was noted to be oriented to time , place and person. Patient also had no complaints , and states she does not know why she was sent to ED. Patient currently states she feels well and has no complaint on my interview. Patient however was alert and oriented to place and self only at time of interview. On ros she denies n/v/d/dysuria/ chest pain, fever   CT: 1.7 cm stone left upjx with mod hydro HB stable 9.9 WBC 6.5 Cr 1.06  On careful evaluation it appears the patient likely did have changes in mental status.  Otherwise stable.  Lengthy discussion with family with our physician assistant   Past Medical History:  Diagnosis Date   Back pain    Cancer (HCC)    Skin, basal cell   Dyspnea    Fatigue    GERD (gastroesophageal reflux disease)    Headache(784.0)    history of migraines   History of kidney stones    Hypercholesteremia    Hypertension    Leg swelling    Bilateral   Occasional tremors    mild hand tremors   Osteoarthritis    PONV (postoperative nausea and vomiting)    RBD (REM behavioral disorder)    Rheumatoid arthritis(714.0)    Scoliosis of lumbar spine    Spinal stenosis     Multiple levels   Tic douloureux    Trigeminal neuralgia 03/30/2015   Vision abnormalities    Past Surgical History:  Procedure Laterality Date   ABDOMINAL HYSTERECTOMY     AUGMENTATION MAMMAPLASTY Bilateral    COLONOSCOPY     IR THORACENTESIS ASP PLEURAL SPACE W/IMG GUIDE  07/26/2023   JOINT REPLACEMENT     right uni knee, right hip with 2 revisions   KIDNEY STONE SURGERY Bilateral    KNEE ARTHROTOMY Right 02/27/2018   Procedure: RIGHT KNEE ARTHROTOMY; scar excision;  Surgeon: Ollen Gross, MD;  Location: [facility];  Service: Orthopedics;  Laterality: Right;   MOHS SURGERY     RIGHT/LEFT HEART CATH AND CORONARY ANGIOGRAPHY N/A 07/30/2023   Procedure: RIGHT/LEFT HEART CATH AND CORONARY ANGIOGRAPHY;  Surgeon: Kathleene Hazel, MD;  Location: [facility];  Service: Cardiovascular;  Laterality: N/A;   TONSILLECTOMY     TOTAL HIP ARTHROPLASTY     TOTAL KNEE ARTHROPLASTY  01/22/2013   Procedure: TOTAL KNEE ARTHROPLASTY;  Surgeon: Loanne Drilling, MD;  Location: [facility];  Service: Orthopedics;  Laterality: Right;  Revision of a Right Uni Knee to a Total Knee Arthroplasty    Medications: I have reviewed the patient's current medications. Allergies:  Allergies  Allergen Reactions   Morphine And Codeine Hives   Prevacid [Lansoprazole] Other (See Comments)    unknown   Provigil [Modafinil] Other (See Comments)    dizziness   Adhesive [Tape]  Rash    bandaids     Family History  Problem Relation Age of Onset   Heart disease Mother    Parkinson's disease Father    Social History:  reports that she has never smoked. She has never used smokeless tobacco. She reports current alcohol use. She reports that she does not use drugs.  ROS: All systems are reviewed and negative except as noted. Rest negative  Physical Exam:  Vital signs in last 24 hours: Temp:  [97.3 F (36.3 C)-97.6 F (36.4 C)] 97.6 F (36.4 C) (09/05 0400) Pulse Rate:  [62-75] 62 (09/05 0400) Resp:  [11-23] 16  (09/05 0400) BP: (107-141)/(64-89) 107/65 (09/05 0400) SpO2:  [97 %-100 %] 98 % (09/05 0400) Weight:  [55.5 kg-56 kg] 55.5 kg (09/05 0500)  Cardiovascular: Skin warm; not flushed Respiratory: Breaths quiet; no shortness of breath Abdomen: No masses Neurological: Normal sensation to touch Musculoskeletal: Normal motor function arms and legs Lymphatics: No inguinal adenopathy Skin: No rashes Genitourinary:nontoxic  Laboratory Data:  Results for orders placed or performed during the hospital encounter of 08/29/23 (from the past 72 hour(s))  CBC with Differential     Status: Abnormal   Collection Time: 08/29/23  7:16 PM  Result Value Ref Range   WBC 6.7 4.0 - 10.5 K/uL   RBC 4.13 3.87 - 5.11 MIL/uL   Hemoglobin 11.0 (L) 12.0 - 15.0 g/dL   HCT 14.7 (L) 82.9 - 56.2 %   MCV 85.0 80.0 - 100.0 fL   MCH 26.6 26.0 - 34.0 pg   MCHC 31.3 30.0 - 36.0 g/dL   RDW 13.0 (H) 86.5 - 78.4 %   Platelets 261 150 - 400 K/uL   nRBC 0.0 0.0 - 0.2 %   Neutrophils Relative % 77 %   Neutro Abs 5.2 1.7 - 7.7 K/uL   Lymphocytes Relative 14 %   Lymphs Abs 1.0 0.7 - 4.0 K/uL   Monocytes Relative 7 %   Monocytes Absolute 0.5 0.1 - 1.0 K/uL   Eosinophils Relative 1 %   Eosinophils Absolute 0.0 0.0 - 0.5 K/uL   Basophils Relative 0 %   Basophils Absolute 0.0 0.0 - 0.1 K/uL   Immature Granulocytes 1 %   Abs Immature Granulocytes 0.04 0.00 - 0.07 K/uL    Comment: Performed at [facility], [address], [place], [place] [address]  Comprehensive metabolic panel     Status: Abnormal   Collection Time: 08/29/23  7:16 PM  Result Value Ref Range   Sodium 129 (L) 135 - 145 mmol/L   Potassium 3.9 3.5 - 5.1 mmol/L   Chloride 97 (L) 98 - 111 mmol/L   CO2 19 (L) 22 - 32 mmol/L   Glucose, Bld 109 (H) 70 - 99 mg/dL    Comment: Glucose reference range applies only to samples taken after fasting for at least 8 hours.   BUN 20 8 - 23 mg/dL   Creatinine, Ser 5.28 0.44 - 1.00 mg/dL   Calcium 8.2 (L) 8.9 - 10.3  mg/dL   Total Protein 5.2 (L) 6.5 - 8.1 g/dL   Albumin 2.2 (L) 3.5 - 5.0 g/dL   AST 24 15 - 41 U/L   ALT 25 0 - 44 U/L   Alkaline Phosphatase 120 38 - 126 U/L   Total Bilirubin 0.6 0.3 - 1.2 mg/dL   GFR, Estimated 60 (L) >60 mL/min    Comment: (NOTE) Calculated using the CKD-EPI Creatinine Equation (2021)    Anion gap 13 5 -  15    Comment: Performed at [facility], [address] [place], [place] [address]  Troponin I (High Sensitivity)     Status: None   Collection Time: 08/29/23  7:16 PM  Result Value Ref Range   Troponin I (High Sensitivity) 10 <18 ng/L    Comment: (NOTE) Elevated high sensitivity troponin I (hsTnI) values and significant  changes across serial measurements may suggest ACS but many other  chronic and acute conditions are known to elevate hsTnI results.  Refer to the "Links" section for chest pain algorithms and additional  guidance. Performed at [facility], [address], [place], [place] [address]   TSH     Status: Abnormal   Collection Time: 08/29/23  7:16 PM  Result Value Ref Range   TSH 6.326 (H) 0.350 - 4.500 uIU/mL    Comment: Performed by a 3rd Generation assay with a functional sensitivity of <=0.01 uIU/mL. Performed at [facility], [address], [place], [place] [address]   SARS Coronavirus 2 by RT PCR (hospital order, performed in [facility] hospital lab) *cepheid single result test* Anterior Nasal Swab     Status: None   Collection Time: 08/29/23  7:17 PM   Specimen: Anterior Nasal Swab  Result Value Ref Range   SARS Coronavirus 2 by RT PCR NEGATIVE NEGATIVE    Comment: Performed at [facility], [address], [place], [place] [address]  Urinalysis, w/ Reflex to Culture (Infection Suspected) -Urine, Clean Catch     Status: Abnormal   Collection Time: 08/29/23  8:12 PM  Result Value Ref Range   Specimen Source IN/OUT CATH URINE     Comment: CORRECTED ON 09/04 AT 2315: PREVIOUSLY REPORTED AS URINE, CLEAN CATCH    Color, Urine AMBER (A) YELLOW    Comment: BIOCHEMICALS MAY BE AFFECTED BY COLOR   APPearance CLOUDY (A) CLEAR   Specific Gravity, Urine 1.014 1.005 - 1.030   pH 5.0 5.0 - 8.0   Glucose, UA NEGATIVE NEGATIVE mg/dL   Hgb urine dipstick LARGE (A) NEGATIVE   Bilirubin Urine NEGATIVE NEGATIVE   Ketones, ur NEGATIVE NEGATIVE mg/dL   Protein, ur 027 (A) NEGATIVE mg/dL   Nitrite NEGATIVE NEGATIVE   Leukocytes,Ua LARGE (A) NEGATIVE   RBC / HPF >50 0 - 5 RBC/hpf   WBC, UA >50 0 - 5 WBC/hpf    Comment:        Reflex urine culture not performed if WBC <=10, OR if Squamous epithelial cells >5. If Squamous epithelial cells >5 suggest recollection.    Bacteria, UA MANY (A) NONE SEEN   Squamous Epithelial / HPF 0-5 0 - 5 /HPF   Mucus PRESENT     Comment: Performed at [facility] Lab, [address] [place], [place] [address]  Urine Culture     Status: None (Preliminary result)   Collection Time: 08/29/23  8:12 PM   Specimen: In/Out Cath Urine  Result Value Ref Range   Specimen Description IN/OUT CATH URINE    Special Requests      NONE Reflexed from [id number] Performed at [facility], [address], [place], [place] [address]    Culture PENDING    Report Status PENDING   Troponin I (High Sensitivity)     Status: None   Collection Time: 08/29/23 10:33 PM  Result Value Ref Range   Troponin I (High Sensitivity) 12 <18 ng/L    Comment: (NOTE) Elevated high sensitivity troponin  I (hsTnI) values and significant  changes across serial measurements may suggest ACS but many other  chronic and acute conditions are known to elevate hsTnI results.  Refer to the "Links" section for chest pain algorithms and additional  guidance. Performed at [facility], [address], [place], [place] [address]   Lactic acid, plasma     Status: None   Collection Time: 08/29/23 10:33 PM  Result Value Ref Range   Lactic Acid, Venous 1.2 0.5 - 1.9 mmol/L    Comment: Performed at [facility], [address] [place], [place] [address]  Lactic acid, plasma     Status: None   Collection Time: 08/30/23  3:13 AM  Result Value Ref Range   Lactic Acid, Venous 0.8 0.5 - 1.9 mmol/L    Comment: Performed at [facility] Lab, [address], [place], [place] [address]  T4, free     Status: Abnormal   Collection Time: 08/30/23  3:13 AM  Result Value Ref Range   Free T4 0.34 (L) 0.61 - 1.12 ng/dL    Comment: (NOTE) Biotin ingestion may interfere with free T4 tests. If the results are inconsistent with the TSH level, previous test results, or the clinical presentation, then consider biotin interference. If needed, order repeat testing after stopping biotin. Performed at [facility], [address] [place], [place] [address]   Comprehensive metabolic panel     Status: Abnormal   Collection Time: 08/30/23  3:13 AM  Result Value Ref Range   Sodium 131 (L) 135 - 145 mmol/L   Potassium 3.9 3.5 - 5.1 mmol/L   Chloride 100 98 - 111 mmol/L   CO2 21 (L) 22 - 32 mmol/L   Glucose, Bld 88 70 - 99 mg/dL    Comment: Glucose reference range applies only to samples taken after fasting for at least 8 hours.   BUN 18 8 - 23 mg/dL   Creatinine, Ser 2.95 (H) 0.44 - 1.00 mg/dL   Calcium 8.0 (L) 8.9 - 10.3 mg/dL   Total Protein 4.7 (L) 6.5 - 8.1 g/dL   Albumin 2.0 (L) 3.5 - 5.0 g/dL   AST 20 15 - 41 U/L   ALT 22 0 - 44 U/L   Alkaline Phosphatase 112 38 - 126 U/L   Total Bilirubin 0.4 0.3 - 1.2 mg/dL   GFR, Estimated 54 (L) >60 mL/min    Comment: (NOTE) Calculated using the CKD-EPI Creatinine Equation (2021)    Anion gap 10 5 - 15    Comment: Performed at [facility] Lab, [address] [place], [place] [address]  CBC     Status: Abnormal   Collection Time: 08/30/23  3:13 AM  Result Value Ref Range   WBC 6.5 4.0 - 10.5 K/uL   RBC 3.75 (L) 3.87 - 5.11 MIL/uL   Hemoglobin 9.9 (L) 12.0 - 15.0 g/dL   HCT 86.5 (L) 78.4 - 69.6 %   MCV 81.6 80.0 - 100.0 fL   MCH 26.4 26.0 -  34.0 pg   MCHC 32.4 30.0 - 36.0 g/dL   RDW 29.5 (H) 28.4 - 13.2 %   Platelets 220 150 - 400 K/uL   nRBC 0.0 0.0 - 0.2 %    Comment: Performed at [facility] Lab, [address], [place], [place] [address]  C-reactive protein     Status: Abnormal   Collection Time: 08/30/23  3:13 AM  Result Value Ref Range  CRP 5.5 (H) <1.0 mg/dL    Comment: Performed at [facility] Lab, [address], [place], [place] [address]  Blood gas, venous     Status: Abnormal   Collection Time: 08/30/23  3:13 AM  Result Value Ref Range   pH, Ven 7.43 7.25 - 7.43   pCO2, Ven 39 (L) 44 - 60 mmHg   pO2, Ven <31 (LL) 32 - 45 mmHg    Comment: CRITICAL RESULT CALLED TO, READ BACK BY AND VERIFIED WITH: TAYLOR,W. RN @0330  08/30/23 SATRAINR    Bicarbonate 25.9 20.0 - 28.0 mmol/L   Acid-Base Excess 1.3 0.0 - 2.0 mmol/L   O2 Saturation 42.1 %   Patient temperature 36.3    Collection site LEFTWRIST    Drawn by STONE,P.RN     Comment: Performed at [facility] Lab, [address], [place], [place] [address]  Procalcitonin     Status: None   Collection Time: 08/30/23  3:13 AM  Result Value Ref Range   Procalcitonin 0.12 ng/mL    Comment:        Interpretation: PCT (Procalcitonin) <= 0.5 ng/mL: Systemic infection (sepsis) is not likely. Local bacterial infection is possible. (NOTE)       Sepsis PCT Algorithm           Lower Respiratory Tract                                      Infection PCT Algorithm    ----------------------------     ----------------------------         PCT < 0.25 ng/mL                PCT < 0.10 ng/mL          Strongly encourage             Strongly discourage   discontinuation of antibiotics    initiation of antibiotics    ----------------------------     -----------------------------       PCT 0.25 - 0.50 ng/mL            PCT 0.10 - 0.25 ng/mL               OR       >80% decrease in PCT            Discourage initiation of                                            antibiotics       Encourage discontinuation           of antibiotics    ----------------------------     -----------------------------         PCT >= 0.50 ng/mL              PCT 0.26 - 0.50 ng/mL               AND        <80% decrease in PCT             Encourage initiation of  antibiotics       Encourage continuation           of antibiotics    ----------------------------     -----------------------------        PCT >= 0.50 ng/mL                  PCT > 0.50 ng/mL               AND         increase in PCT                  Strongly encourage                                      initiation of antibiotics    Strongly encourage escalation           of antibiotics                                     -----------------------------                                           PCT <= 0.25 ng/mL                                                 OR                                        > 80% decrease in PCT                                      Discontinue / Do not initiate                                             antibiotics  Performed at [facility], [address], [place], [place] [address]    Recent Results (from the past 240 hour(s))  SARS Coronavirus 2 by RT PCR (hospital order, performed in [facility] hospital lab) *cepheid single result test* Anterior Nasal Swab     Status: None   Collection Time: 08/29/23  7:17 PM   Specimen: Anterior Nasal Swab  Result Value Ref Range Status   SARS Coronavirus 2 by RT PCR NEGATIVE NEGATIVE Final    Comment: Performed at [facility] Lab, [address], [place], [place] [address]  Urine Culture     Status: None (Preliminary result)   Collection Time: 08/29/23  8:12 PM   Specimen: In/Out Cath Urine  Result Value Ref Range Status   Specimen Description IN/OUT CATH URINE  Final   Special Requests   Final    NONE Reflexed from [id number] Performed at [facility], [address], [place], [place] [address]     Culture  PENDING  Incomplete   Report Status PENDING  Incomplete   Creatinine: Recent Labs    08/29/23 1916 08/30/23 0313  CREATININE 0.98 1.06*    Xrays: See report/chart Noted   Impression/Assessment:  Left renal stone with moderate hydronephrosis.  Urinary tract infection.  Plan:  Picture drawn.  Pros cons risks of stent and sequelae with percutaneous tube and injury discussed.  Tamre Cass A Arrin Pintor 08/30/2023, 8:03 AM

## 2023-08-30 NOTE — Progress Notes (Signed)
PHARMACY - PHYSICIAN COMMUNICATION CRITICAL VALUE ALERT - BLOOD CULTURE IDENTIFICATION (BCID)  Kristen Hansen is an 77 y.o. female who presented to [facility] on 08/29/2023 with a chief complaint of AMS with recent history of kidney stone.  Assessment:  E. Coli bacteremia, possible urinary source.  Name of physician (or Provider) Contacted: Vladimir Crofts, MD  Current antibiotics: Ceftriaxone 2 g q24h  Changes to prescribed antibiotics recommended:  Patient is on recommended antibiotics - No changes needed  Results for orders placed or performed during the hospital encounter of 08/29/23  Blood Culture ID Panel (Reflexed) (Collected: 08/29/2023 10:33 PM)  Result Value Ref Range   Enterococcus faecalis NOT DETECTED NOT DETECTED   Enterococcus Faecium NOT DETECTED NOT DETECTED   Listeria monocytogenes NOT DETECTED NOT DETECTED   Staphylococcus species NOT DETECTED NOT DETECTED   Staphylococcus aureus (BCID) NOT DETECTED NOT DETECTED   Staphylococcus epidermidis NOT DETECTED NOT DETECTED   Staphylococcus lugdunensis NOT DETECTED NOT DETECTED   Streptococcus species NOT DETECTED NOT DETECTED   Streptococcus agalactiae NOT DETECTED NOT DETECTED   Streptococcus pneumoniae NOT DETECTED NOT DETECTED   Streptococcus pyogenes NOT DETECTED NOT DETECTED   A.calcoaceticus-baumannii NOT DETECTED NOT DETECTED   Bacteroides fragilis NOT DETECTED NOT DETECTED   Enterobacterales DETECTED (A) NOT DETECTED   Enterobacter cloacae complex NOT DETECTED NOT DETECTED   Escherichia coli DETECTED (A) NOT DETECTED   Klebsiella aerogenes NOT DETECTED NOT DETECTED   Klebsiella oxytoca NOT DETECTED NOT DETECTED   Klebsiella pneumoniae NOT DETECTED NOT DETECTED   Proteus species NOT DETECTED NOT DETECTED   Salmonella species NOT DETECTED NOT DETECTED   Serratia marcescens NOT DETECTED NOT DETECTED   Haemophilus influenzae NOT DETECTED NOT DETECTED   Neisseria meningitidis NOT DETECTED NOT DETECTED   Pseudomonas  aeruginosa NOT DETECTED NOT DETECTED   Stenotrophomonas maltophilia NOT DETECTED NOT DETECTED   Candida albicans NOT DETECTED NOT DETECTED   Candida auris NOT DETECTED NOT DETECTED   Candida glabrata NOT DETECTED NOT DETECTED   Candida krusei NOT DETECTED NOT DETECTED   Candida parapsilosis NOT DETECTED NOT DETECTED   Candida tropicalis NOT DETECTED NOT DETECTED   Cryptococcus neoformans/gattii NOT DETECTED NOT DETECTED   CTX-M ESBL NOT DETECTED NOT DETECTED   Carbapenem resistance IMP NOT DETECTED NOT DETECTED   Carbapenem resistance KPC NOT DETECTED NOT DETECTED   Carbapenem resistance NDM NOT DETECTED NOT DETECTED   Carbapenem resist OXA 48 LIKE NOT DETECTED NOT DETECTED   Carbapenem resistance VIM NOT DETECTED NOT DETECTED    Lora Paula, PharmD PGY-2 Infectious Diseases Pharmacy Resident 08/30/2023 12:06 PM

## 2023-08-30 NOTE — Progress Notes (Signed)
[facility] Pre-op Review  Pre-op checklist:   not complete at time of this note  NPO:  yes  Labs: Na 131  glu 88   Consent:  not signed time of note  H&P:  done by attending : not by urologist  Vitals:   98.6  p67 r 11  o2 sat 97  O2 requirements:   RA  MAR/PTA review:  lopressor 25 mg thisam / Eliquis - unsure if taken today  IV:  20g R AC  Floor nurse name:   Natale Milch [phone]  Additional info:  oriented x 2  has lewy body dementia   PAF on Eliquis

## 2023-08-30 NOTE — Plan of Care (Signed)

## 2023-08-30 NOTE — Plan of Care (Signed)
  Problem: Clinical Measurements: Goal: Ability to maintain clinical measurements within normal limits will improve Outcome: Progressing Goal: Will remain free from infection Outcome: Progressing Goal: Respiratory complications will improve Outcome: Progressing Goal: Cardiovascular complication will be avoided Outcome: Progressing   Problem: Activity: Goal: Risk for activity intolerance will decrease Outcome: Progressing   Problem: Nutrition: Goal: Adequate nutrition will be maintained Outcome: Progressing   Problem: Coping: Goal: Level of anxiety will decrease Outcome: Progressing   Problem: Elimination: Goal: Will not experience complications related to bowel motility Outcome: Progressing Goal: Will not experience complications related to urinary retention Outcome: Progressing   Problem: Pain Managment: Goal: General experience of comfort will improve Outcome: Progressing   Problem: Safety: Goal: Ability to remain free from injury will improve Outcome: Progressing   Problem: Skin Integrity: Goal: Risk for impaired skin integrity will decrease Outcome: Progressing   

## 2023-08-31 ENCOUNTER — Encounter (HOSPITAL_COMMUNITY): Payer: Self-pay | Admitting: Urology

## 2023-08-31 ENCOUNTER — Institutional Professional Consult (permissible substitution): Payer: Medicare PPO | Admitting: Pulmonary Disease

## 2023-08-31 DIAGNOSIS — N200 Calculus of kidney: Secondary | ICD-10-CM | POA: Diagnosis not present

## 2023-08-31 DIAGNOSIS — N3001 Acute cystitis with hematuria: Secondary | ICD-10-CM | POA: Diagnosis not present

## 2023-08-31 DIAGNOSIS — N10 Acute tubulo-interstitial nephritis: Secondary | ICD-10-CM | POA: Diagnosis not present

## 2023-08-31 DIAGNOSIS — Z7189 Other specified counseling: Secondary | ICD-10-CM | POA: Diagnosis not present

## 2023-08-31 LAB — BASIC METABOLIC PANEL
Anion gap: 7 (ref 5–15)
BUN: 14 mg/dL (ref 8–23)
CO2: 22 mmol/L (ref 22–32)
Calcium: 8 mg/dL — ABNORMAL LOW (ref 8.9–10.3)
Chloride: 103 mmol/L (ref 98–111)
Creatinine, Ser: 0.97 mg/dL (ref 0.44–1.00)
GFR, Estimated: >60 mL/min (ref >60)
Glucose, Bld: 83 mg/dL (ref 70–99)
Potassium: 3.6 mmol/L (ref 3.5–5.1)
Sodium: 132 mmol/L — ABNORMAL LOW (ref 135–145)

## 2023-08-31 LAB — URINE CULTURE: Culture: >=100000 — AB

## 2023-08-31 LAB — CBC
HCT: 27.9 % — ABNORMAL LOW (ref 36.0–46.0)
Hemoglobin: 9.1 g/dL — ABNORMAL LOW (ref 12.0–15.0)
MCH: 26.5 pg (ref 26.0–34.0)
MCHC: 32.6 g/dL (ref 30.0–36.0)
MCV: 81.1 fL (ref 80.0–100.0)
Platelets: 271 10*3/uL (ref 150–400)
RBC: 3.44 MIL/uL — ABNORMAL LOW (ref 3.87–5.11)
RDW: 16 % — ABNORMAL HIGH (ref 11.5–15.5)
WBC: 6.8 10*3/uL (ref 4.0–10.5)
nRBC: 0 % (ref 0.0–0.2)

## 2023-08-31 LAB — PHOSPHORUS: Phosphorus: 2.8 mg/dL (ref 2.5–4.6)

## 2023-08-31 LAB — MAGNESIUM: Magnesium: 1.6 mg/dL — ABNORMAL LOW (ref 1.7–2.4)

## 2023-08-31 LAB — GLUCOSE, CAPILLARY: Glucose-Capillary: 93 mg/dL (ref 70–99)

## 2023-08-31 LAB — T3: T3, Total: 56 ng/dL — ABNORMAL LOW (ref 71–180)

## 2023-08-31 MED ORDER — LOPERAMIDE HCL 2 MG PO CAPS
2.0000 mg | ORAL_CAPSULE | Freq: Four times a day (QID) | ORAL | Status: DC | PRN
  Administered 2023-08-31 – 2023-09-01 (×2): 2 mg via ORAL
  Filled 2023-08-31 (×3): qty 1

## 2023-08-31 MED ORDER — CHLORHEXIDINE GLUCONATE CLOTH 2 % EX PADS
6.0000 | MEDICATED_PAD | Freq: Every day | CUTANEOUS | Status: DC
  Administered 2023-08-31 – 2023-09-02 (×3): 6 via TOPICAL

## 2023-08-31 NOTE — Progress Notes (Signed)
1 Day Post-Op Subjective: Patient up in chair accompanied by her husband.  She is more alert this afternoon.  Objective: Vital signs in last 24 hours: Temp:  [97 F (36.1 C)-98.5 F (36.9 C)] 97.3 F (36.3 C) (09/06 1119) Pulse Rate:  [64-73] 70 (09/06 1119) Resp:  [8-20] 12 (09/06 1119) BP: (122-166)/(59-83) 144/67 (09/06 1119) SpO2:  [96 %-100 %] 98 % (09/06 1119) Weight:  [52.4 kg] 52.4 kg (09/06 0500)  Assessment/Plan:  Intake/Output from previous day: 09/05 0701 - 09/06 0700 In: 935.2 [P.O.:120; I.V.:684.5; IV Piggyback:100.7] Out: 200 [Urine:200] # UTI Resistant E. coli UTI, sensitive to Rocephin which patient is already receiving.   # Ureteral stone CT A/P shows 1.2 x 1.7 obstructing left UPJ stone. S/p stent with Dr. Sherron Monday on 08/30/2023.  Follow-up is being arranged for definitive stone management with [facility].  #hematuria D/T instrumentation while on twice daily Eliquis.  Would recommend holding this if medically reasonable until hematuria has resolved. Nursing to hand irrigate as needed.  We will follow along peripherally.  Please call with questions  Intake/Output this shift: No intake/output data recorded.  Physical Exam:  General: Alert and oriented CV: No cyanosis Lungs: equal chest rise Gu: Foley catheter in place draining clear medium red urine.  Lab Results: Recent Labs    08/29/23 1916 08/30/23 0313 08/31/23 0332  HGB 11.0* 9.9* 9.1*  HCT 35.1* 30.6* 27.9*   BMET Recent Labs    08/30/23 0313 08/31/23 0332  NA 131* 132*  K 3.9 3.6  CL 100 103  CO2 21* 22  GLUCOSE 88 83  BUN 18 14  CREATININE 1.06* 0.97  CALCIUM 8.0* 8.0*     Studies/Results: DG C-Arm 1-60 Min  Result Date: 08/30/2023 CLINICAL DATA:  Elective surgery. Cystoscopy with retrograde pyelogram, left ureteral stent placement. EXAM: DG C-ARM 1-60 MIN CONTRAST:  Not provided. FLUOROSCOPY: Fluoroscopy Time:  45 seconds Radiation Exposure Index (if provided  by the fluoroscopic device): 4.4829 mGy Number of Acquired Spot Images: 1 COMPARISON:  CT yesterday FINDINGS: Single fluoroscopic spot view of the abdomen obtained. A ureteral stent is present. Multiple left renal calculi. IMPRESSION: Intraoperative fluoroscopy demonstrating ureteral stent in place. Electronically Signed   By: Narda Rutherford M.D.   On: 08/30/2023 20:49   DG Chest Port 1 View  Result Date: 08/29/2023 CLINICAL DATA:  Altered mental status. EXAM: PORTABLE CHEST 1 VIEW COMPARISON:  08/07/2023, CT 07/31/2023 FINDINGS: Normal heart size and mediastinal contours. Mild blunting of the right costophrenic angle likely related to scarring a possible trace pleural effusion. No confluent consolidation or pneumothorax. Normal pulmonary vasculature. IMPRESSION: Mild blunting of the right costophrenic angle likely related to scarring a possible trace pleural effusion. Electronically Signed   By: Narda Rutherford M.D.   On: 08/29/2023 20:49   CT HEAD WO CONTRAST ( )  Result Date: 08/29/2023 CLINICAL DATA:  Mental status change. EXAM: CT HEAD WITHOUT CONTRAST TECHNIQUE: Contiguous axial images were obtained from the base of the skull through the vertex without intravenous contrast. RADIATION DOSE REDUCTION: This exam was performed according to the departmental dose-optimization program which includes automated exposure control, adjustment of the mA and/or kV according to patient size and/or use of iterative reconstruction technique. COMPARISON:  MRI and CT scan head from 08/06/2023. FINDINGS: Brain: No evidence of acute infarction, hemorrhage, hydrocephalus, extra-axial collection or mass lesion/mass effect. 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 There is bilateral periventricular hypodensity, which is non-specific but most likely seen in the  settings of microvascular ischemic changes. Mild in extent. Vascular: No hyperdense vessel or unexpected calcification.  Intracranial arteriosclerosis. Skull: Normal. Negative for fracture or focal lesion. Sinuses/Orbits: No acute finding. Other: There are soft tissue attenuation areas in bilateral external auditory canal, likely cerumen. IMPRESSION: No acute intracranial abnormality. Electronically Signed   By: Jules Schick M.D.   On: 08/29/2023 20:19   CT Renal Stone Study  Result Date: 08/29/2023 CLINICAL DATA:  Abdominal/flank pain, stone suspected EXAM: CT ABDOMEN AND PELVIS WITHOUT CONTRAST TECHNIQUE: Multidetector CT imaging of the abdomen and pelvis was performed following the standard protocol without IV contrast. RADIATION DOSE REDUCTION: This exam was performed according to the departmental dose-optimization program which includes automated exposure control, adjustment of the mA and/or kV according to patient size and/or use of iterative reconstruction technique. COMPARISON:  CT scan abdomen and pelvis report from 11/14/2016. FINDINGS: Lower chest: The lung bases are clear. At least small right pleural effusion, partially imaged. No left pleural effusion. The heart is normal in size. No pericardial effusion. Partially seen bilateral breast implants. Hepatobiliary: The liver is normal in size. Non-cirrhotic configuration. No suspicious mass. No intrahepatic or extrahepatic bile duct dilation. No calcified gallstones. Normal gallbladder wall thickness. No pericholecystic inflammatory changes. Pancreas: Unremarkable. No pancreatic ductal dilatation or surrounding inflammatory changes. Spleen: Within normal limits. No focal lesion. Adrenals/Urinary Tract: Adrenal glands are unremarkable. No suspicious renal mass seen within the limitations of this unenhanced exam. There is a 1.2 x 1.7 cm calculus in the left pelviureteric junction causing moderate hydronephrosis. There are additional multiple subcentimeter sized calculi in the left renal collecting system. Left ureter is nondilated. No right nephroureterolithiasis or  obstructive uropathy. 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 Urinary bladder is not diagnostically evaluated due to extensive streak artifacts. Stomach/Bowel: No disproportionate dilation of the small or large bowel loops. No evidence of abnormal bowel wall thickening or inflammatory changes. The appendix was not visualized; however there is no acute inflammatory process in the right lower quadrant. Vascular/Lymphatic: No ascites or pneumoperitoneum. No abdominal or pelvic lymphadenopathy, by size criteria. No aneurysmal dilation of the major abdominal arteries. There are mild peripheral atherosclerotic vascular calcifications of the aorta and its major branches. Reproductive: Not diagnostically evaluated due to extensive streak artifacts. Other: The visualized soft tissues and abdominal wall are unremarkable. Musculoskeletal: No suspicious osseous lesions. There are moderate multilevel degenerative changes in the visualized spine. IMPRESSION: 1. There is a 1.7 cm left pelvic ureteric junction calculus causing moderate hydronephrosis. There are additional several subcentimeter sized calculi in the left kidney. 2. No right nephroureterolithiasis or obstructive uropathy. 3. Multiple other nonacute observations, as described above. Electronically Signed   By: Jules Schick M.D.   On: 08/29/2023 20:15      LOS: 2 days   Elmon Kirschner, NP [facility] Pager: [phone]  08/31/2023, 12:44 PM

## 2023-08-31 NOTE — Evaluation (Signed)
Physical Therapy Evaluation Patient Details Name: Kristen Hansen MRN: [id number] DOB: Sep 19, 1946 Today's Date: 08/31/2023  History of Present Illness  Patient presents to ED on 9/4 due to increased confusion, her workup significant for ecoli UTI. PMH - RA, lewy-body dementia, REM sleep d/o, prior DVT s/p treatment, Sjogren's syndrome, chronic diastolic CHF, orthostatic hypotension,CKDIII, hyperthyroidism,mitral stenosis and hypertension. Patient also has interim history of diagnosis of  pericarditis, Paroxsymal,a fib, right pleural effusion s/p throacentesis/chest tube, during recent hospitalization 7//31-8/14.  Clinical Impression  Pt admitted with above diagnosis and presents to PT with functional limitations due to deficits listed below (See PT problem list). Pt needs skilled PT to maximize independence and safety. Pt was receiving therapy at SNF prior to admission. Patient will benefit from continued inpatient follow up therapy, <3 hours/day           If plan is discharge home, recommend the following: A lot of help with walking and/or transfers;A lot of help with bathing/dressing/bathroom   Can travel by private vehicle   No    Equipment Recommendations None recommended by PT  Recommendations for Other Services       Functional Status Assessment Patient has had a recent decline in their functional status and demonstrates the ability to make significant improvements in function in a reasonable and predictable amount of time.     Precautions / Restrictions Precautions Precautions: Fall Precaution Comments: watch BP, constant dizziness/vertigo Restrictions Weight Bearing Restrictions: No      Mobility  Bed Mobility Overal bed mobility: Needs Assistance Bed Mobility: Supine to Sit, Sit to Supine     Supine to sit: Total assist Sit to supine: Max assist   General bed mobility comments: Assist to bring legs off of bed, elevate trunk into sitting and bring hips to EOB. Assist  to lower trunk and bring legs up into bed.    Transfers Overall transfer level: Needs assistance Equipment used: Rolling walker (2 wheels) Transfers: Sit to/from Stand Sit to Stand: Mod assist           General transfer comment: Assist to power up and correct posterior lean    Ambulation/Gait             Pre-gait activities: Side stepping up side of bed with mod assist with walker    Stairs            Wheelchair Mobility     Tilt Bed    Modified Rankin (Stroke Patients Only)       Balance Overall balance assessment: Needs assistance Sitting-balance support: Bilateral upper extremity supported, Feet supported Sitting balance-Leahy Scale: Poor Sitting balance - Comments: Min assist to prevent falling backwards Postural control: Posterior lean Standing balance support: During functional activity, Reliant on assistive device for balance, Bilateral upper extremity supported Standing balance-Leahy Scale: Poor Standing balance comment: walker and min to assist for static standing                             Pertinent Vitals/Pain Pain Assessment Pain Assessment: Faces Faces Pain Scale: No hurt    Home Living Family/patient expects to be discharged to:: Skilled nursing facility                   Additional Comments: From [facility], hoping to return    Prior Function Prior Level of Function : Needs assist         Mobility (physical): Bed mobility;Transfers;Gait;Stairs ADLs (physical): Grooming;Bathing;Dressing;Toileting Mobility  Comments: mod-max assist with transfers/mobility ADLs Comments: assist with all ADLs     Extremity/Trunk Assessment   Upper Extremity Assessment Upper Extremity Assessment: Defer to OT evaluation    Lower Extremity Assessment Lower Extremity Assessment: Generalized weakness       Communication   Communication Communication: No apparent difficulties  Cognition Arousal: Alert Behavior During  Therapy: Flat affect Overall Cognitive Status: History of cognitive impairments - at baseline                                          General Comments      Exercises     Assessment/Plan    PT Assessment Patient needs continued PT services  PT Problem List Decreased strength;Decreased mobility;Decreased safety awareness;Decreased activity tolerance;Decreased cognition;Decreased balance       PT Treatment Interventions DME instruction;Gait training;Functional mobility training;Therapeutic activities;Therapeutic exercise;Balance training;Patient/family education    PT Goals (Current goals can be found in the Care Plan section)  Acute Rehab PT Goals Patient Stated Goal: none reported PT Goal Formulation: With patient Time For Goal Achievement: 09/14/23 Potential to Achieve Goals: Good    Frequency Min 1X/week     Co-evaluation               AM-PAC PT "6 Clicks" Mobility  Outcome Measure Help needed turning from your back to your side while in a flat bed without using bedrails?: A Little Help needed moving from lying on your back to sitting on the side of a flat bed without using bedrails?: Total Help needed moving to and from a bed to a chair (including a wheelchair)?: Total Help needed standing up from a chair using your arms (e.g., wheelchair or bedside chair)?: A Lot Help needed to walk in hospital room?: Total Help needed climbing 3-5 steps with a railing? : Total 6 Click Score: 9    End of Session Equipment Utilized During Treatment: Gait belt Activity Tolerance: Patient limited by fatigue Patient left: in bed;with call bell/phone within reach;with bed alarm set Nurse Communication: Mobility status PT Visit Diagnosis: Unsteadiness on feet (R26.81);Other abnormalities of gait and mobility (R26.89)    Time: 1610-9604 PT Time Calculation (min) (ACUTE ONLY): 20 min   Charges:   PT Evaluation $PT Eval Moderate Complexity: 1 Mod   PT  General Charges $$ ACUTE PT VISIT: 1 Visit         [facility] PT Acute Rehabilitation Services Office [phone]   Angelina Ok [facility] 08/31/2023, 3:48 PM

## 2023-08-31 NOTE — Anesthesia Postprocedure Evaluation (Signed)
Anesthesia Post Note  Patient: Kristen Hansen  Procedure(s) Performed: CYSTOSCOPY WITH RETROGRADE PYELOGRAM/ LEFT URETERAL STENT PLACEMENT (Left)     Patient location during evaluation: PACU Anesthesia Type: General Level of consciousness: awake and alert Pain management: pain level controlled Vital Signs Assessment: post-procedure vital signs reviewed and stable Respiratory status: spontaneous breathing, nonlabored ventilation, respiratory function stable and patient connected to nasal cannula oxygen Cardiovascular status: blood pressure returned to baseline and stable Postop Assessment: no apparent nausea or vomiting Anesthetic complications: no   No notable events documented.  Last Vitals:  Vitals:   08/31/23 0000 08/31/23 0400  BP: 134/70 131/69  Pulse: 64 67  Resp: 12 18  Temp: 36.9 C 36.4 C  SpO2:      Last Pain:  Vitals:   08/31/23 0400  TempSrc: Axillary  PainSc:                  Lorilyn Laitinen S

## 2023-08-31 NOTE — Progress Notes (Signed)
Daily Progress Note   Patient Name: Kristen Hansen       Date: 08/31/2023 DOB: 1946-07-20  Age: 77 y.o. MRN#: [id number] Attending Physician: Starleen Arms, MD Primary Care Physician: Baxter Hire Admit Date: 08/29/2023  Reason for Consultation/Follow-up: Establishing goals of care  Subjective: Medical records reviewed including progress notes, labs and imaging. Patient assessed at the bedside. She is in good spirits today, denies pain or distress. Her husband is present visiting.   Created space and opportunity for patient and family's thoughts and feelings on her current illness.  They have been discussing her wishes and confirm that goal is currently for SNF placement if insurance will cover this, as well as continued outpatient palliative care follow-up.  Husband is in the process of coordinating with equipment company regarding patient's wheelchair and would like more information about insurance coverage and/or co-pay.  Offered to speak with TOC and advocate for assistance.  Questions and concerns addressed. PMT will continue to support holistically.   Length of Stay: 2   Physical Exam Vitals and nursing note reviewed.  Constitutional:      General: She is not in acute distress.    Appearance: She is ill-appearing.  Cardiovascular:     Rate and Rhythm: Normal rate.  Pulmonary:     Effort: Pulmonary effort is normal.  Skin:    General: Skin is warm and dry.  Neurological:     Mental Status: She is alert.  Psychiatric:        Mood and Affect: Mood normal.        Behavior: Behavior normal.            Vital Signs: BP 131/69 (BP Location: Left Arm)   Pulse 67   Temp 97.6 F (36.4 C) (Axillary)   Resp 18   Ht 5\' 10"  (1.778 m)   Wt 52.4 kg   SpO2 98%    BMI 16.58 kg/m  SpO2: SpO2: 98 % O2 Device: O2 Device: Room Air O2 Flow Rate: O2 Flow Rate (L/min): 2 L/min      Palliative Care Assessment & Plan   Patient Profile: 77 y.o. female  with past medical history of RA, lewy-body dementia, REM sleep d/o, prior DVT s/p treatment, Sjogren's syndrome, chronic diastolic CHF, orthostatic hypotension, CKDIII, GERD, hyperthyroidism,mitral stenosis and hypertension,  trigeminal neuralgia, depression, chronic back pain from spinal stenosis of the lumbar region, admitted on 08/29/2023 with altered mental status.    Patient was recently hospitalized from 7/31 to 8/14 with pericarditis, atrial fibrillation, right pleural effusion s/p throacentesis/chest tube.  Workup in the ED showed UTI with concern for infected renal stone in the left UPJ.  Hospitalization further complicated by bacteremia.  Plan for OR for left ureteral stent placement. PMT has been consulted to assist with goals of care conversation.  Assessment: Goals of care conversation UTI with infected ureteral stone and bacteremia status post left ureteral stent Lewy body dementia Acute metabolic encephalopathy, improving  Recommendations/Plan: Continue DNR/DNI Continue current care Goal is for SNF placement if covered by insurance, continuing outpatient palliative care follow-up Discussed husband's inquiry into wheelchair coverage with TOC, assistance is appreciated Psychosocial and emotional support provided PMT remains available as needed   Prognosis: Poor overall prognosis given recurrent acute illnesses and several chronic comorbidities, ongoing functional decline  Discharge Planning: To Be Determined  Care plan was discussed with patient, patient's husband, TOC   Total time: I spent 35 minutes in the care of the patient today in the above activities and documenting the encounter.       Antonio Creswell Jeni Salles, PA-C  Palliative Medicine Team Team phone # [phone]  Thank you  for allowing the Palliative Medicine Team to assist in the care of this patient. Please utilize secure chat with additional questions, if there is no response within 30 minutes please call the above phone number.  Palliative Medicine Team providers are available by phone from 7am to 7pm daily and can be reached through the team cell phone.  Should this patient require assistance outside of these hours, please call the patient's attending physician.

## 2023-08-31 NOTE — Evaluation (Addendum)
Occupational Therapy Evaluation Patient Details Name: Kristen Hansen MRN: [id number] DOB: 07-09-1946 Today's Date: 08/31/2023   History of Present Illness Kristen Hansen is a 77 y.o. female with medical history significant of PMH of RA, lewy-body dementia, REM sleep d/o, prior DVT s/p treatment, Sjogren's syndrome, chronic diastolic CHF, orthostatic hypotension,CKDIII,GERD, hyperthyroidism,mitral stenosis and hypertension. Patient also has interim history of diagnosis of  pericarditis, Paroxsymal,atrial fibrillation, right pleural effusion s/p throacentesis/chest tube, during recent hospitalization 7//31-8/14. Patient now presents to ED due to increased confusion, her workup significant for UTI, she is admitted for further workup.   Clinical Impression   Pt s/p above diagnosis. Pt from [facility], needs assist with all ADLs/mobility at baseline. Pt  able to fully participate in therapy, increased cueing for participation. Pt currently appears to have declined from prior hospitalization 1 month ago, requiring increased assistance with all ADLs and increased confusion, also states she is dizzy and has vertigo constantly. Pt currently mod-total A for all ADLs/mobility, significant generalized weakness and confusion. Pt would benefit greatly from postacute rehab <3hrs/day to improve to functional level prior to return home to reduce caregiver burden. Pt to be seen acutely during stay to maximize progress as able.       If plan is discharge home, recommend the following: A lot of help with walking and/or transfers;A lot of help with bathing/dressing/bathroom;Assistance with cooking/housework;Assist for transportation;Help with stairs or ramp for entrance    Functional Status Assessment  Patient has had a recent decline in their functional status and demonstrates the ability to make significant improvements in function in a reasonable and predictable amount of time.  Equipment Recommendations  None  recommended by OT    Recommendations for Other Services       Precautions / Restrictions Precautions Precautions: Fall Precaution Comments: watch BP, constant dizziness/vertigo Restrictions Weight Bearing Restrictions: No      Mobility Bed Mobility Overal bed mobility: Needs Assistance Bed Mobility: Supine to Sit     Supine to sit: Max assist     General bed mobility comments: max A for sitting up and scooting to EOB, assist for balance    Transfers Overall transfer level: Needs assistance Equipment used: Rolling walker (2 wheels) Transfers: Sit to/from Stand, Bed to chair/wheelchair/BSC Sit to Stand: Mod assist     Step pivot transfers: Min assist     General transfer comment: mod A to power STS, min A for pivot transfer to assist with RW and hand placement      Balance Overall balance assessment: Needs assistance Sitting-balance support: Bilateral upper extremity supported, Feet supported Sitting balance-Leahy Scale: Poor Sitting balance - Comments: poor sitting EOB, limited ROM at trunk, posterior lean Postural control: Posterior lean Standing balance support: During functional activity, Reliant on assistive device for balance, Bilateral upper extremity supported Standing balance-Leahy Scale: Poor Standing balance comment: reliant on RW, verbal/tactile cues for hand placement/safety                           ADL either performed or assessed with clinical judgement   ADL Overall ADL's : Needs assistance/impaired Eating/Feeding: Set up;Sitting   Grooming: Minimal assistance;Sitting   Upper Body Bathing: Moderate assistance;Sitting   Lower Body Bathing: Total assistance;Sitting/lateral leans   Upper Body Dressing : Moderate assistance;Sitting   Lower Body Dressing: Total assistance;Sitting/lateral leans   Toilet Transfer: Moderate assistance;Stand-pivot;BSC/3in1;Rolling walker (2 wheels)   Toileting- Clothing Manipulation and Hygiene: Total  assistance;Sitting/lateral lean  Functional mobility during ADLs: Minimal assistance;Rolling walker (2 wheels) General ADL Comments: assist with mobilizing RW with transfer, overall weakness limits participation with all ADLs     Vision Baseline Vision/History: 1 Wears glasses Ability to See in Adequate Light: 0 Adequate Patient Visual Report: No change from baseline       Perception         Praxis         Pertinent Vitals/Pain Pain Assessment Pain Assessment: Faces Faces Pain Scale: Hurts a little bit Pain Descriptors / Indicators: Guarding, Grimacing Pain Intervention(s): Monitored during session     Extremity/Trunk Assessment Upper Extremity Assessment Upper Extremity Assessment: Generalized weakness   Lower Extremity Assessment Lower Extremity Assessment: Defer to PT evaluation       Communication Communication Communication: No apparent difficulties   Cognition Arousal: Alert Behavior During Therapy: Flat affect Overall Cognitive Status: History of cognitive impairments - at baseline                                 General Comments: A/Ox2, not oriented to time or situation, able to follow commands inconsistently     General Comments       Exercises     Shoulder Instructions      Home Living Family/patient expects to be discharged to:: Skilled nursing facility                                 Additional Comments: From [facility], hoping to return      Prior Functioning/Environment Prior Level of Function : Needs assist         Mobility (physical): Bed mobility;Transfers;Gait;Stairs ADLs (physical): Grooming;Bathing;Dressing;Toileting Mobility Comments: mod-max assist with transfers/mobility ADLs Comments: assist with all ADLs        OT Problem List: Decreased strength;Decreased range of motion;Decreased activity tolerance;Impaired balance (sitting and/or standing);Decreased cognition;Decreased safety  awareness;Decreased knowledge of use of DME or AE;Impaired UE functional use;Pain      OT Treatment/Interventions: Self-care/ADL training;Therapeutic exercise;DME and/or AE instruction;Energy conservation;Therapeutic activities;Patient/family education;Balance training    OT Goals(Current goals can be found in the care plan section) Acute Rehab OT Goals Patient Stated Goal: not able to participate in goal setting OT Goal Formulation: With patient/family Time For Goal Achievement: 09/14/23 Potential to Achieve Goals: Fair ADL Goals Pt Will Perform Upper Body Dressing: with contact guard assist;sitting Pt Will Perform Lower Body Dressing: with min assist;sitting/lateral leans Pt Will Transfer to Toilet: with contact guard assist;bedside commode Pt Will Perform Toileting - Clothing Manipulation and hygiene: with set-up;with supervision;sitting/lateral leans Additional ADL Goal #1: Pt will be able to perform bed mobility, supine to sit, with HOB elevated, with CGA to maximize independence/participation and prepare for transfers  OT Frequency: Min 1X/week    Co-evaluation              AM-PAC OT "6 Clicks" Daily Activity     Outcome Measure Help from another person eating meals?: A Little Help from another person taking care of personal grooming?: A Little Help from another person toileting, which includes using toliet, bedpan, or urinal?: Total Help from another person bathing (including washing, rinsing, drying)?: A Lot Help from another person to put on and taking off regular upper body clothing?: A Lot Help from another person to put on and taking off regular lower body clothing?: A Lot 6 Click Score: 13  End of Session Equipment Utilized During Treatment: Gait belt;Rolling walker (2 wheels) Nurse Communication: Mobility status  Activity Tolerance: Patient tolerated treatment well Patient left: in chair;with call bell/phone within reach;with nursing/sitter in room;with  family/visitor present  OT Visit Diagnosis: Unsteadiness on feet (R26.81);Other abnormalities of gait and mobility (R26.89);Muscle weakness (generalized) (M62.81);Other symptoms and signs involving cognitive function;Pain                Time: 0981-1914 OT Time Calculation (min): 36 min Charges:  OT General Charges $OT Visit: 1 Visit OT Evaluation $OT Eval Moderate Complexity: 1 Mod OT Treatments $Self Care/Home Management : 8-22 mins  [place], OTR/L   Alexis Goodell 08/31/2023, 12:05 PM

## 2023-08-31 NOTE — TOC Progression Note (Addendum)
Transition of Care [facility]) - Progression Note    Patient Details  Name: Kristen Hansen MRN: [id number] Date of Birth: 03-Jan-1946  Transition of [facility]) CM/SW Contact  Mearl Latin, LCSW Phone Number: 08/31/2023, 11:58 AM  Clinical Narrative:    11:58am-CSW inquiring with [facility] if patient will incur copays if [facility] approves a continued rehab stay as she has used approximately 21 days. [facility] will check and let CSW know. They are able to accept patient over the weekend if needed. CSW will begin insurance authorization if therapy is able to see patient today.   2pm-Per [facility], patient will return under copay status (per [facility] they have ordered wheelchair from [employer] and home help with [employer]). CSW spoke with patient's spouse and explained. He stated they would be able to pay the copays if insurance approves. CSW made [facility] aware and left voicemail for therapy office to see if patient can be seen to start insurance process.   4:03 PM-CSW submitted clinicals to insurance for review, Ref# [id number].    Barriers to Discharge: Continued Medical Work up, Conservator, museum/gallery and Services In-house Referral: Clinical Social Work     Living arrangements for the past 2 months: Skilled Holiday representative, Single Family Home                                       Social Determinants of Health (SDOH) Interventions SDOH Screenings   Food Insecurity: No Food Insecurity (08/30/2023)  Housing: Low Risk  (08/30/2023)  Transportation Needs: No Transportation Needs (08/30/2023)  Utilities: Not At Risk (08/30/2023)  Financial Resource Strain: Low Risk  (08/22/2022)   [facility], [facility] to [facility]24[facility]23[facility] visits prior to 02/24/2023.  Physical Activity: Inactive (08/22/2022)   Received [facility], [facility] prior  to 02/24/2023., [facility] visits prior to 02/24/2023.  Social Connections: Socially Integrated (08/22/2022)   [facility], [facility] prior to 02/24/2023., [facility] visits prior to 02/24/2023.  Stress: No Stress Concern Present (08/22/2022)   [facility] prior to 02/24/2023., [facility] visits prior to 02/24/2023.  Tobacco Use: Low Risk  (08/30/2023)    Readmission Risk Interventions    08/08/2023    3:02 PM  Readmission Risk Prevention Plan  Transportation Screening Complete  PCP or Specialist Appt within 3-5 Days Complete  Medication Review (RN Care Manager) Complete

## 2023-08-31 NOTE — Progress Notes (Signed)
PROGRESS NOTE    Kristen Hansen  ZOX:[id number] DOB: 04/21/1946 DOA: 08/29/2023 PCP: Kerin Salen, PA-C   Chief Complaint  Patient presents with   Altered Mental Status    Brief Narrative:   Kristen Hansen is a 77 y.o. female with medical history significant of PMH of RA, lewy-body dementia, REM sleep d/o, prior DVT s/p treatment, Sjogren's syndrome, chronic diastolic CHF, orthostatic hypotension,CKDIII,GERD, hyperthyroidism,mitral stenosis and hypertension. Patient also has interim history of diagnosis of  pericarditis, Paroxsymal,atrial fibrillation, right pleural effusion s/p throacentesis/chest tube, during recent hospitalization 7//31-8/14. Patient now presents to ED due to increased confusion, her workup significant for UTI, she is admitted for further workup.     Assessment & Plan:   Principal Problem:   UTI (urinary tract infection)    E. coli UTI, infected ureteral stone and bacteremia -Blood culture growing E. coli, follow urine cultures, imaging CT abdomen pelvis showing 12.5 x 1.7 obstructing left U PJ stone, -Urology input greatly appreciated, went OR for left ureteral stent placement Dr. McDermit on 9/5 '-Continue with IV fluids -Continue with as needed pain medications -Culture growing E. coli, sensitive to ceftriaxone, will continue   Acute Metabolic Encephalopathy on baseline demention - due to acute infection  -treat underlying cause   -CTH NAD    Hx Pericarditis  (diagnosed hospitalization 7//31-8/14) Hx of Exudative pleural effusion ( now resolved) -presumed due to Autoimmune cause with hx of RA - continue on colchicine 0.6 Mg p.o. daily , with plans to complete 3 mo course   Paroxsymal Atrial fibrillation -continue on amiodarone, metoprolol, DOAC   CHF pef  - no acute exacerbation  -continue with metoprolol/lasix, arb ,spironolactone -recent cardiac cath clean c's   Hx of Dysphagia  - thought be related to Lewy body dementia  progression -must maintain aspiration precautions    Lewy body dementia  -follows with neurology out patient  -On pyridostigmine    Orthostatic hypotension -continue with ted hose   Hyperthyrodism -noted elevated tsh  -hold tapazole for now  f/u on t3/t4 and titrate med as needed   Hypertension -stable bp  - continue  home regimen    HLD -continue with statin   Hx of DVT -s/p treatment  -no dvt noted no last dvt doppler studies   CKDIII - cr at baseline    GERD -ppi     Rheumatoid Arthritis/ Sjogren's syndrome  -Resume home regimen as able    Chronic Back pain /spinal stenosis lumbar region -supportive care    Depression - continue on SSRi   Trigeminal neuralgia  - continue on lamictal and trileptal   DVT prophylaxis: Held Eliquis this morning will see patient for surgery afternoon Code Status: DNR Family Communication: Discussed with husband at bedside Disposition:   Status is: Inpatient    Consultants:  Palliative medicine  Cardiology  Urology    Subjective:  No significant events overnight as discussed with staff, husband at bedside he denies any significant events as well, patient cannot provide any reliable complaints Objective: Vitals:   08/31/23 0000 08/31/23 0400 08/31/23 0500 08/31/23 1119  BP: 134/70 131/69  (!) 144/67  Pulse: 64 67  70  Resp: 12 18  12   Temp: 98.5 F (36.9 C) 97.6 F (36.4 C)  (!) 97.3 F (36.3 C)  TempSrc: Axillary Axillary  Oral  SpO2:    98%  Weight:   52.4 kg   Height:        Intake/Output Summary (Last 24 hours) at 08/31/2023  1526 Last data filed at 08/31/2023 1454 Gross per 24 hour  Intake 1175.24 ml  Output 500 ml  Net 675.24 ml   Filed Weights   08/29/23 1855 08/30/23 0500 08/31/23 0500  Weight: 56 kg 55.5 kg 52.4 kg    Examination:  Awake Alert, Oriented X 2, extremely frail, chronically ill-appearing Symmetrical Chest wall movement, Good air movement bilaterally, CTAB RRR,No Gallops,Rubs or  new Murmurs, No Parasternal Heave +ve B.Sounds, Abd Soft, No tenderness, No rebound - guarding or rigidity. No Cyanosis, Clubbing or edema, No new Rash or bruise       Data Reviewed: I have personally reviewed following labs and imaging studies  CBC: Recent Labs  Lab 08/29/23 1916 08/30/23 0313 08/31/23 0332  WBC 6.7 6.5 6.8  NEUTROABS 5.2  --   --   HGB 11.0* 9.9* 9.1*  HCT 35.1* 30.6* 27.9*  MCV 85.0 81.6 81.1  PLT 261 220 271    Basic Metabolic Panel: Recent Labs  Lab 08/29/23 1916 08/30/23 0313 08/31/23 0332  NA 129* 131* 132*  K 3.9 3.9 3.6  CL 97* 100 103  CO2 19* 21* 22  GLUCOSE 109* 88 83  BUN 20 18 14   CREATININE 0.98 1.06* 0.97  CALCIUM 8.2* 8.0* 8.0*  MG  --   --  1.6*  PHOS  --   --  2.8    GFR: Estimated Creatinine Clearance: 40.8 mL/min (by C-G formula based on SCr of 0.97 mg/dL).  Liver Function Tests: Recent Labs  Lab 08/29/23 1916 08/30/23 0313  AST 24 20  ALT 25 22  ALKPHOS 120 112  BILITOT 0.6 0.4  PROT 5.2* 4.7*  ALBUMIN 2.2* 2.0*    CBG: Recent Labs  Lab 08/31/23 1206  GLUCAP 93     Recent Results (from the past 240 hour(s))  SARS Coronavirus 2 by RT PCR (hospital order, performed in [facility] hospital lab) *cepheid single result test* Anterior Nasal Swab     Status: None   Collection Time: 08/29/23  7:17 PM   Specimen: Anterior Nasal Swab  Result Value Ref Range Status   SARS Coronavirus 2 by RT PCR NEGATIVE NEGATIVE Final    Comment: Performed at [facility], [address], [place], [place] [address]  Urine Culture     Status: Abnormal   Collection Time: 08/29/23  8:12 PM   Specimen: In/Out Cath Urine  Result Value Ref Range Status   Specimen Description IN/OUT CATH URINE  Final   Special Requests   Final    NONE Reflexed from [id number] Performed at [facility], [address] [place], [place] [address]    Culture >=100,000 COLONIES/mL ESCHERICHIA COLI (A)  Final   Report Status 08/31/2023 FINAL   Final   Organism ID, Bacteria ESCHERICHIA COLI (A)  Final      Susceptibility   Escherichia coli - MIC*    AMPICILLIN >=32 RESISTANT Resistant     CEFAZOLIN 8 SENSITIVE Sensitive     CEFEPIME <=0.12 SENSITIVE Sensitive     CEFTRIAXONE <=0.25 SENSITIVE Sensitive     CIPROFLOXACIN <=0.25 SENSITIVE Sensitive     GENTAMICIN >=16 RESISTANT Resistant     IMIPENEM <=0.25 SENSITIVE Sensitive     NITROFURANTOIN <=16 SENSITIVE Sensitive     TRIMETH/SULFA >=320 RESISTANT Resistant     AMPICILLIN/SULBACTAM >=32 RESISTANT Resistant     PIP/TAZO <=4 SENSITIVE Sensitive     * >=100,000 COLONIES/mL ESCHERICHIA COLI  Blood culture (routine x 2)  Status: Abnormal (Preliminary result)   Collection Time: 08/29/23 10:33 PM   Specimen: BLOOD LEFT ARM  Result Value Ref Range Status   Specimen Description BLOOD LEFT ARM  Final   Special Requests   Final    BOTTLES DRAWN AEROBIC AND ANAEROBIC Blood Culture adequate volume   Culture  Setup Time   Final    GRAM NEGATIVE RODS IN BOTH AEROBIC AND ANAEROBIC BOTTLES CRITICAL RESULT CALLED TO, READ BACK BY AND VERIFIED WITH: PHARMD BAILEY AGEE [phone] [id number] FCP    Culture (A)  Final    ESCHERICHIA COLI SUSCEPTIBILITIES TO FOLLOW Performed at [facility], [address], [place], [place] [address]    Report Status PENDING  Incomplete  Blood culture (routine x 2)     Status: None (Preliminary result)   Collection Time: 08/29/23 10:33 PM   Specimen: BLOOD RIGHT FOREARM  Result Value Ref Range Status   Specimen Description BLOOD RIGHT FOREARM  Final   Special Requests   Final    BOTTLES DRAWN AEROBIC AND ANAEROBIC Blood Culture adequate volume   Culture  Setup Time   Final    GRAM NEGATIVE RODS IN BOTH AEROBIC AND ANAEROBIC BOTTLES    Culture   Final    GRAM NEGATIVE RODS IDENTIFICATION TO FOLLOW Performed at [facility], [address], [place], [place] [address]    Report Status PENDING  Incomplete  Blood Culture ID Panel (Reflexed)      Status: Abnormal   Collection Time: 08/29/23 10:33 PM  Result Value Ref Range Status   Enterococcus faecalis NOT DETECTED NOT DETECTED Final   Enterococcus Faecium NOT DETECTED NOT DETECTED Final   Listeria monocytogenes NOT DETECTED NOT DETECTED Final   Staphylococcus species NOT DETECTED NOT DETECTED Final   Staphylococcus aureus (BCID) NOT DETECTED NOT DETECTED Final   Staphylococcus epidermidis NOT DETECTED NOT DETECTED Final   Staphylococcus lugdunensis NOT DETECTED NOT DETECTED Final   Streptococcus species NOT DETECTED NOT DETECTED Final   Streptococcus agalactiae NOT DETECTED NOT DETECTED Final   Streptococcus pneumoniae NOT DETECTED NOT DETECTED Final   Streptococcus pyogenes NOT DETECTED NOT DETECTED Final   A.calcoaceticus-baumannii NOT DETECTED NOT DETECTED Final   Bacteroides fragilis NOT DETECTED NOT DETECTED Final   Enterobacterales DETECTED (A) NOT DETECTED Final    Comment: Enterobacterales represent a large order of gram negative bacteria, not a single organism. CRITICAL RESULT CALLED TO, READ BACK BY AND VERIFIED WITH: PHARMD BAILEY AGEE [phone] FCP    Enterobacter cloacae complex NOT DETECTED NOT DETECTED Final   Escherichia coli DETECTED (A) NOT DETECTED Final    Comment: CRITICAL RESULT CALLED TO, READ BACK BY AND VERIFIED WITH: PHARMD BAILEY AGEE [phone] FCP    Klebsiella aerogenes NOT DETECTED NOT DETECTED Final   Klebsiella oxytoca NOT DETECTED NOT DETECTED Final   Klebsiella pneumoniae NOT DETECTED NOT DETECTED Final   Proteus species NOT DETECTED NOT DETECTED Final   Salmonella species NOT DETECTED NOT DETECTED Final   Serratia marcescens NOT DETECTED NOT DETECTED Final   Haemophilus influenzae NOT DETECTED NOT DETECTED Final   Neisseria meningitidis NOT DETECTED NOT DETECTED Final   Pseudomonas aeruginosa NOT DETECTED NOT DETECTED Final   Stenotrophomonas maltophilia NOT DETECTED NOT DETECTED Final   Candida albicans NOT DETECTED NOT DETECTED  Final   Candida auris NOT DETECTED NOT DETECTED Final   Candida glabrata NOT DETECTED NOT DETECTED Final   Candida krusei NOT DETECTED NOT DETECTED Final   Candida parapsilosis NOT  DETECTED NOT DETECTED Final   Candida tropicalis NOT DETECTED NOT DETECTED Final   Cryptococcus neoformans/gattii NOT DETECTED NOT DETECTED Final   CTX-M ESBL NOT DETECTED NOT DETECTED Final   Carbapenem resistance IMP NOT DETECTED NOT DETECTED Final   Carbapenem resistance KPC NOT DETECTED NOT DETECTED Final   Carbapenem resistance NDM NOT DETECTED NOT DETECTED Final   Carbapenem resist OXA 48 LIKE NOT DETECTED NOT DETECTED Final   Carbapenem resistance VIM NOT DETECTED NOT DETECTED Final    Comment: Performed at [facility], [address] [place], [place] [address]  MRSA Next Gen by PCR, Nasal     Status: None   Collection Time: 08/30/23  2:20 PM   Specimen: Nasal Mucosa; Nasal Swab  Result Value Ref Range Status   MRSA by PCR Next Gen NOT DETECTED NOT DETECTED Final    Comment: (NOTE) The GeneXpert MRSA Assay (FDA approved for NASAL specimens only), is one component of a comprehensive MRSA colonization surveillance program. It is not intended to diagnose MRSA infection nor to guide or monitor treatment for MRSA infections. Test performance is not FDA approved in patients less than 39 years old. Performed at [facility], [address], [place], [place] [address]          Radiology Studies: DG C-Arm 1-60 Min  Result Date: 08/30/2023 CLINICAL DATA:  Elective surgery. Cystoscopy with retrograde pyelogram, left ureteral stent placement. EXAM: DG C-ARM 1-60 MIN CONTRAST:  Not provided. FLUOROSCOPY: Fluoroscopy Time:  45 seconds Radiation Exposure Index (if provided by the fluoroscopic device): 4.4829 mGy Number of Acquired Spot Images: 1 COMPARISON:  CT yesterday FINDINGS: Single fluoroscopic spot view of the abdomen obtained. A ureteral stent is present. Multiple left renal calculi.  IMPRESSION: Intraoperative fluoroscopy demonstrating ureteral stent in place. Electronically Signed   By: Narda Rutherford M.D.   On: 08/30/2023 20:49   DG Chest Port 1 View  Result Date: 08/29/2023 CLINICAL DATA:  Altered mental status. EXAM: PORTABLE CHEST 1 VIEW COMPARISON:  08/07/2023, CT 07/31/2023 FINDINGS: Normal heart size and mediastinal contours. Mild blunting of the right costophrenic angle likely related to scarring a possible trace pleural effusion. No confluent consolidation or pneumothorax. Normal pulmonary vasculature. IMPRESSION: Mild blunting of the right costophrenic angle likely related to scarring a possible trace pleural effusion. Electronically Signed   By: Narda Rutherford M.D.   On: 08/29/2023 20:49   CT HEAD WO CONTRAST ( )  Result Date: 08/29/2023 CLINICAL DATA:  Mental status change. EXAM: CT HEAD WITHOUT CONTRAST TECHNIQUE: Contiguous axial images were obtained from the base of the skull through the vertex without intravenous contrast. RADIATION DOSE REDUCTION: This exam was performed according to the departmental dose-optimization program which includes automated exposure control, adjustment of the mA and/or kV according to patient size and/or use of iterative reconstruction technique. COMPARISON:  MRI and CT scan head from 08/06/2023. FINDINGS: Brain: No evidence of acute infarction, hemorrhage, hydrocephalus, extra-axial collection or mass lesion/mass effect. 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 There is bilateral periventricular hypodensity, which is non-specific but most likely seen in the settings of microvascular ischemic changes. Mild in extent. Vascular: No hyperdense vessel or unexpected calcification. Intracranial arteriosclerosis. Skull: Normal. Negative for fracture or focal lesion. Sinuses/Orbits: No acute finding. Other: There are soft tissue attenuation areas in bilateral external auditory canal, likely cerumen. IMPRESSION: No  acute intracranial abnormality. Electronically Signed   By: Jules Schick M.D.   On: 08/29/2023 20:19   CT  Renal Stone Study  Result Date: 08/29/2023 CLINICAL DATA:  Abdominal/flank pain, stone suspected EXAM: CT ABDOMEN AND PELVIS WITHOUT CONTRAST TECHNIQUE: Multidetector CT imaging of the abdomen and pelvis was performed following the standard protocol without IV contrast. RADIATION DOSE REDUCTION: This exam was performed according to the departmental dose-optimization program which includes automated exposure control, adjustment of the mA and/or kV according to patient size and/or use of iterative reconstruction technique. COMPARISON:  CT scan abdomen and pelvis report from 11/14/2016. FINDINGS: Lower chest: The lung bases are clear. At least small right pleural effusion, partially imaged. No left pleural effusion. The heart is normal in size. No pericardial effusion. Partially seen bilateral breast implants. Hepatobiliary: The liver is normal in size. Non-cirrhotic configuration. No suspicious mass. No intrahepatic or extrahepatic bile duct dilation. No calcified gallstones. Normal gallbladder wall thickness. No pericholecystic inflammatory changes. Pancreas: Unremarkable. No pancreatic ductal dilatation or surrounding inflammatory changes. Spleen: Within normal limits. No focal lesion. Adrenals/Urinary Tract: Adrenal glands are unremarkable. No suspicious renal mass seen within the limitations of this unenhanced exam. There is a 1.2 x 1.7 cm calculus in the left pelviureteric junction causing moderate hydronephrosis. There are additional multiple subcentimeter sized calculi in the left renal collecting system. Left ureter is nondilated. No right nephroureterolithiasis or obstructive uropathy. 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 Urinary bladder is not diagnostically evaluated due to extensive streak artifacts. Stomach/Bowel:  No disproportionate dilation of the small or large bowel loops. No evidence of abnormal bowel wall thickening or inflammatory changes. The appendix was not visualized; however there is no acute inflammatory process in the right lower quadrant. Vascular/Lymphatic: No ascites or pneumoperitoneum. No abdominal or pelvic lymphadenopathy, by size criteria. No aneurysmal dilation of the major abdominal arteries. There are mild peripheral atherosclerotic vascular calcifications of the aorta and its major branches. Reproductive: Not diagnostically evaluated due to extensive streak artifacts. Other: The visualized soft tissues and abdominal wall are unremarkable. Musculoskeletal: No suspicious osseous lesions. There are moderate multilevel degenerative changes in the visualized spine. IMPRESSION: 1. There is a 1.7 cm left pelvic ureteric junction calculus causing moderate hydronephrosis. There are additional several subcentimeter sized calculi in the left kidney. 2. No right nephroureterolithiasis or obstructive uropathy. 3. Multiple other nonacute observations, as described above. Electronically Signed   By: Jules Schick M.D.   On: 08/29/2023 20:15        Scheduled Meds:  amiodarone  200 mg Oral Daily   apixaban  5 mg Oral BID   Chlorhexidine Gluconate Cloth  6 each Topical Daily   colchicine  0.6 mg Oral Daily   feeding supplement  237 mL Oral BID BM   influenza vaccine adjuvanted  0.5 mL Intramuscular Tomorrow-1000   irbesartan  150 mg Oral Daily   lamoTRIgine  150 mg Oral Daily   methimazole  10 mg Oral Daily   metoprolol tartrate  25 mg Oral BID   montelukast  10 mg Oral QHS   Oxcarbazepine  300 mg Oral BID   pantoprazole  40 mg Oral Daily   pravastatin  40 mg Oral Daily   pyridostigmine  60 mg Oral Q8H   sertraline  50 mg Oral Daily   spironolactone  12.5 mg Oral Daily   Continuous Infusions:  cefTRIAXone (ROCEPHIN)  IV 2 g (08/31/23 1423)     LOS: 2 days      Huey Bienenstock,  MD [facility]   To contact  the attending provider between 7A-7P or the covering provider during after hours 7P-7A, please log into the web site [email or web address] and access using universal  password for that web site. If you do not have the password, please call the hospital operator.  08/31/2023, 3:26 PM

## 2023-08-31 NOTE — [place] FL2 (Signed)
[place] MEDICAID FL2 LEVEL OF CARE FORM     IDENTIFICATION  Patient Name: Kristen Hansen Birthdate: [birth year] Sex: female Admission Date (Current Location): 08/29/2023  [facility] and [place] Number:  Producer, television/film/video and Address:  [facility], [address] [place], [place] [address]      Provider Number: [phone]  Attending Physician Name and Address:  Elgergawy, Leana Roe, MD  Relative Name and Phone Number:       Current Level of Care: Hospital Recommended Level of Care: Skilled Nursing Facility Prior Approval Number:    Date Approved/Denied:   PASRR Number: [id number]  Discharge Plan: Home    Current Diagnoses: Patient Active Problem List   Diagnosis Date Noted   UTI (urinary tract infection) 08/29/2023   PAF (paroxysmal atrial fibrillation) (HCC) 07/30/2023   Need for management of chest tube 07/30/2023   Acute idiopathic pericarditis 07/26/2023   Pleural effusion 07/25/2023   Chest pain 07/25/2023   Sjogren's syndrome (HCC) 07/25/2023   Hyperthyroidism 06/07/2023   Lewy body dementia without behavioral disturbance, psychotic disturbance, mood disturbance, or anxiety (HCC) 06/08/2022   Spinal stenosis of lumbar region 06/08/2022   Gastroesophageal reflux disease with esophagitis 04/19/2022   Speech disturbance 12/06/2021   Depression 12/06/2021   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) 09/22/2021   Mixed hyperlipidemia 08/23/2021   Other peripheral vertigo, unspecified ear 01/20/2021   Chronic heart failure with preserved ejection fraction (HCC) 11/08/2020   History of fusion of cervical spine 09/27/2020   Transient alteration of awareness 09/27/2020   Orthostatic hypotension 09/27/2020   Cervical radiculopathy 01/06/2020   Lumbar radiculopathy 01/06/2020   CKD (chronic kidney disease) stage 3, GFR 30-59 ml/min (HCC) 07/21/2019   Numbness 05/26/2019   Chronic bilateral low back pain without sciatica  09/05/2018   Arthrofibrosis of knee joint, right 02/27/2018   Facet syndrome 04/30/2017   Excessive sleepiness 10/31/2016   Avitaminosis D 03/30/2016   Idiopathic peripheral neuropathy 02/23/2016   HTN (hypertension) 02/22/2016   Other fatigue 10/20/2015   Trigeminal neuralgia 03/30/2015   REM behavioral disorder 03/30/2015   Polypharmacy 09/11/2014   Other long term (current) drug therapy 09/11/2014   Arthritis or polyarthritis, rheumatoid (HCC) 05/14/2014   Generalized OA 05/14/2014   Rheumatoid arthritis (HCC) 05/14/2014   Postop Hyponatremia 01/23/2013   Postoperative anemia due to acute blood loss 01/23/2013   Pain due to unicompartmental arthroplasty of knee (HCC) 01/22/2013   Pain due to knee joint prosthesis (HCC) 01/22/2013   Mucositis oral 08/20/2012    Orientation RESPIRATION BLADDER Height & Weight     Self, Situation  Normal Incontinent, Indwelling catheter Weight: 115 lb 8.3 oz (52.4 kg) Height:  5\' 10"  (177.8 cm)  BEHAVIORAL SYMPTOMS/MOOD NEUROLOGICAL BOWEL NUTRITION STATUS      Incontinent Diet (See dc summary)  AMBULATORY STATUS COMMUNICATION OF NEEDS Skin   Extensive Assist Verbally Other (Comment) (Deep tissue injury on sacrum)                       Personal Care Assistance Level of Assistance  Bathing, Feeding, Dressing Bathing Assistance: Maximum assistance Feeding assistance: Limited assistance Dressing Assistance: Maximum assistance     Functional Limitations Info  Sight Sight Info: Impaired (Glasses)        SPECIAL CARE FACTORS FREQUENCY  PT (By licensed PT), OT (By licensed OT)     PT Frequency: 5x/week OT Frequency: 5x/week  Contractures Contractures Info: Not present    Additional Factors Info  Code Status, Allergies, Psychotropic Code Status Info: Full Allergies Info: Morphine And Codeine, Prevacid (Lansoprazole), Provigil (Modafinil), Adhesive (Tape) Psychotropic Info: See dc summary         Current  Medications (08/31/2023):  This is the current hospital active medication list Current Facility-Administered Medications  Medication Dose Route Frequency Provider Last Rate Last Admin   acetaminophen (TYLENOL) tablet 650 mg  650 mg Oral Q6H PRN Alfredo Martinez, MD   650 mg at 08/30/23 1953   amiodarone (PACERONE) tablet 200 mg  200 mg Oral Daily MacDiarmid, Lorin Picket, MD   200 mg at 08/31/23 1118   apixaban (ELIQUIS) tablet 5 mg  5 mg Oral BID Alfredo Martinez, MD   5 mg at 08/31/23 1120   cefTRIAXone (ROCEPHIN) 2 g in sodium chloride 0.9 % 100 mL IVPB  2 g Intravenous Q24H Alfredo Martinez, MD   Stopped at 08/30/23 1410   Chlorhexidine Gluconate Cloth 2 % PADS 6 each  6 each Topical Daily Elgergawy, Leana Roe, MD       colchicine tablet 0.6 mg  0.6 mg Oral Daily MacDiarmid, Scott, MD   0.6 mg at 08/31/23 1120   feeding supplement (ENSURE ENLIVE / ENSURE PLUS) liquid 237 mL  237 mL Oral BID BM MacDiarmid, Scott, MD   237 mL at 08/31/23 1121   guaiFENesin (ROBITUSSIN) 100 MG/5ML liquid 20 mL  20 mL Oral Q4H PRN MacDiarmid, Lorin Picket, MD       influenza vaccine adjuvanted (FLUAD) injection 0.5 mL  0.5 mL Intramuscular Tomorrow-1000 MacDiarmid, Scott, MD       irbesartan (AVAPRO) tablet 150 mg  150 mg Oral Daily MacDiarmid, Scott, MD   150 mg at 08/31/23 1119   lamoTRIgine (LAMICTAL) tablet 150 mg  150 mg Oral Daily MacDiarmid, Scott, MD   150 mg at 08/31/23 1120   levalbuterol (XOPENEX) nebulizer solution 0.63 mg  0.63 mg Nebulization Q6H PRN MacDiarmid, Lorin Picket, MD       meclizine (ANTIVERT) tablet 12.5 mg  12.5 mg Oral Q8H PRN MacDiarmid, Scott, MD       methimazole (TAPAZOLE) tablet 10 mg  10 mg Oral Daily MacDiarmid, Scott, MD   10 mg at 08/31/23 1118   metoprolol tartrate (LOPRESSOR) tablet 25 mg  25 mg Oral BID Alfredo Martinez, MD   25 mg at 08/31/23 1119   montelukast (SINGULAIR) tablet 10 mg  10 mg Oral QHS MacDiarmid, Lorin Picket, MD   10 mg at 08/30/23 2050   nitroGLYCERIN (NITROSTAT) SL tablet 0.4 mg   0.4 mg Sublingual Q5 min PRN Alfredo Martinez, MD       ondansetron (ZOFRAN) tablet 4 mg  4 mg Oral Q6H PRN MacDiarmid, Scott, MD       Or   ondansetron (ZOFRAN) injection 4 mg  4 mg Intravenous Q6H PRN MacDiarmid, Scott, MD       Oral care mouth rinse  15 mL Mouth Rinse PRN MacDiarmid, Scott, MD       Oxcarbazepine (TRILEPTAL) tablet 300 mg  300 mg Oral BID Alfredo Martinez, MD   300 mg at 08/31/23 1120   pantoprazole (PROTONIX) EC tablet 40 mg  40 mg Oral Daily MacDiarmid, Lorin Picket, MD   40 mg at 08/31/23 1120   polyethylene glycol (MIRALAX / GLYCOLAX) packet 17 g  17 g Oral Daily PRN MacDiarmid, Lorin Picket, MD       pravastatin (PRAVACHOL) tablet 40 mg  40 mg Oral Daily Alfredo Martinez, MD  40 mg at 08/31/23 1118   pyridostigmine (MESTINON) tablet 60 mg  60 mg Oral Q8H MacDiarmid, Scott, MD   60 mg at 08/31/23 1402   sertraline (ZOLOFT) tablet 50 mg  50 mg Oral Daily MacDiarmid, Lorin Picket, MD   50 mg at 08/31/23 1120   spironolactone (ALDACTONE) tablet 12.5 mg  12.5 mg Oral Daily MacDiarmid, Lorin Picket, MD   12.5 mg at 08/31/23 1119   valACYclovir (VALTREX) tablet 1,000 mg  1,000 mg Oral Q12H PRN Alfredo Martinez, MD         Discharge Medications: Please see discharge summary for a list of discharge medications.  Relevant Imaging Results:  Relevant Lab Results:   Additional Information [id number]  Mearl Latin, LCSW

## 2023-08-31 NOTE — TOC Progression Note (Signed)
Pt's husband would like for pt to return to SNF at discharge if authorized/qualified without incurring excess costs. If this option is not available, pt does have home care services set up to come help around the clock. Pt would like palliative assistance with physical therapies to help pt.

## 2023-09-01 DIAGNOSIS — N3001 Acute cystitis with hematuria: Secondary | ICD-10-CM | POA: Diagnosis not present

## 2023-09-01 LAB — CULTURE, BLOOD (ROUTINE X 2)
Special Requests: ADEQUATE
Special Requests: ADEQUATE

## 2023-09-01 LAB — HEMOGLOBIN AND HEMATOCRIT, BLOOD
HCT: 33 % — ABNORMAL LOW (ref 36.0–46.0)
Hemoglobin: 10.5 g/dL — ABNORMAL LOW (ref 12.0–15.0)

## 2023-09-01 LAB — BASIC METABOLIC PANEL
Anion gap: 10 (ref 5–15)
BUN: 10 mg/dL (ref 8–23)
CO2: 25 mmol/L (ref 22–32)
Calcium: 8 mg/dL — ABNORMAL LOW (ref 8.9–10.3)
Chloride: 98 mmol/L (ref 98–111)
Creatinine, Ser: 1 mg/dL (ref 0.44–1.00)
GFR, Estimated: 58 mL/min — ABNORMAL LOW (ref >60)
Glucose, Bld: 83 mg/dL (ref 70–99)
Potassium: 3.4 mmol/L — ABNORMAL LOW (ref 3.5–5.1)
Sodium: 133 mmol/L — ABNORMAL LOW (ref 135–145)

## 2023-09-01 LAB — GLUCOSE, CAPILLARY
Glucose-Capillary: 67 mg/dL — ABNORMAL LOW (ref 70–99)
Glucose-Capillary: 79 mg/dL (ref 70–99)
Glucose-Capillary: 87 mg/dL (ref 70–99)

## 2023-09-01 LAB — CBC
HCT: 31.7 % — ABNORMAL LOW (ref 36.0–46.0)
Hemoglobin: 10.3 g/dL — ABNORMAL LOW (ref 12.0–15.0)
MCH: 27 pg (ref 26.0–34.0)
MCHC: 32.5 g/dL (ref 30.0–36.0)
MCV: 83 fL (ref 80.0–100.0)
Platelets: 253 10*3/uL (ref 150–400)
RBC: 3.82 MIL/uL — ABNORMAL LOW (ref 3.87–5.11)
RDW: 16.3 % — ABNORMAL HIGH (ref 11.5–15.5)
WBC: 7.1 10*3/uL (ref 4.0–10.5)
nRBC: 0 % (ref 0.0–0.2)

## 2023-09-01 LAB — PHOSPHORUS: Phosphorus: 2.5 mg/dL (ref 2.5–4.6)

## 2023-09-01 LAB — MAGNESIUM: Magnesium: 1.5 mg/dL — ABNORMAL LOW (ref 1.7–2.4)

## 2023-09-01 MED ORDER — MAGNESIUM SULFATE 2 GM/50ML IV SOLN
2.0000 g | Freq: Once | INTRAVENOUS | Status: AC
  Administered 2023-09-01: 2 g via INTRAVENOUS
  Filled 2023-09-01: qty 50

## 2023-09-01 MED ORDER — POTASSIUM CHLORIDE CRYS ER 20 MEQ PO TBCR
40.0000 meq | EXTENDED_RELEASE_TABLET | Freq: Four times a day (QID) | ORAL | Status: AC
  Administered 2023-09-01 (×2): 40 meq via ORAL
  Filled 2023-09-01 (×2): qty 2

## 2023-09-01 NOTE — Progress Notes (Addendum)
Patient with bright red urine.  Eliquis DC'd.  Stat/serial H&H's ordered.  No clots  noted

## 2023-09-01 NOTE — Plan of Care (Signed)
  Problem: Health Behavior/Discharge Planning: Goal: Ability to manage health-related needs will improve Outcome: Progressing   Problem: Clinical Measurements: Goal: Ability to maintain clinical measurements within normal limits will improve Outcome: Progressing Goal: Will remain free from infection Outcome: Progressing Goal: Diagnostic test results will improve Outcome: Progressing Goal: Respiratory complications will improve Outcome: Progressing Goal: Cardiovascular complication will be avoided Outcome: Progressing   Problem: Activity: Goal: Risk for activity intolerance will decrease Outcome: Progressing   Problem: Nutrition: Goal: Adequate nutrition will be maintained Outcome: Progressing   Problem: Coping: Goal: Level of anxiety will decrease Outcome: Progressing   Problem: Safety: Goal: Ability to remain free from injury will improve Outcome: Progressing   Problem: Skin Integrity: Goal: Risk for impaired skin integrity will decrease Outcome: Progressing   

## 2023-09-01 NOTE — Progress Notes (Addendum)
PROGRESS NOTE    Kristen Hansen  QMV:[id number] DOB: 14-Nov-1946 DOA: 08/29/2023 PCP: Kerin Salen, PA-C   Chief Complaint  Patient presents with   Altered Mental Status    Brief Narrative:   Kristen Hansen is a 77 y.o. female with medical history significant of PMH of RA, lewy-body dementia, REM sleep d/o, prior DVT s/p treatment, Sjogren's syndrome, chronic diastolic CHF, orthostatic hypotension,CKDIII,GERD, hyperthyroidism,mitral stenosis and hypertension. Patient also has interim history of diagnosis of  pericarditis, Paroxsymal,atrial fibrillation, right pleural effusion s/p throacentesis/chest tube, during recent hospitalization 7//31-8/14. Patient now presents to ED due to increased confusion, her workup significant for UTI, she is admitted for further workup.     Assessment & Plan:   Principal Problem:   UTI (urinary tract infection)    E. coli UTI, infected ureteral stone and bacteremia -Blood culture growing E. coli, follow urine cultures, imaging CT abdomen pelvis showing 12.5 x 1.7 obstructing left U PJ stone, -Urology input greatly appreciated, went OR for left ureteral stent placement Dr. McDermit on 9/5 -Continue with IV fluids -Continue with as needed pain medications -Culture growing E. coli, sensitive to ceftriaxone, will continue   Acute Metabolic Encephalopathy on baseline demention - due to acute infection  -treat underlying cause   -CT head  with no acute findings.   Hx Pericarditis  (diagnosed hospitalization 7//31-8/14) Hx of Exudative pleural effusion ( now resolved) -presumed due to Autoimmune cause with hx of RA - continue on colchicine 0.6 Mg p.o. daily , with plans to complete 3 mo course   Paroxsymal Atrial fibrillation -continue on amiodarone, metoprolol, DOAC, hold Eliquis for 24 to 48 hours given some mild hematuria.   CHF pef  - no acute exacerbation  -continue with metoprolol/lasix, arb ,spironolactone -recent cardiac cath  clean c's   Hx of Dysphagia  - thought be related to Lewy body dementia progression -must maintain aspiration precautions    Lewy body dementia  -follows with neurology out patient  -On pyridostigmine    Orthostatic hypotension -continue with ted hose   Hyperthyrodism -noted elevated tsh  -hold tapazole for now  f/u on t3/t4 and titrate med as needed   Hypertension -stable bp  - continue  home regimen    HLD -continue with statin   Hx of DVT -s/p treatment  -no dvt noted no last dvt doppler studies   CKDIII - cr at baseline    GERD -ppi     Rheumatoid Arthritis/ Sjogren's syndrome  -Resume home regimen as able    Chronic Back pain /spinal stenosis lumbar region -supportive care    Depression - continue on SSRi   Trigeminal neuralgia  - continue on lamictal and trileptal   DVT prophylaxis: Held Eliquis this morning will see patient for surgery afternoon Code Status: DNR Family Communication: Discussed with husband at bedside Disposition:   Status is: Inpatient    Consultants:  Palliative medicine  Cardiology  Urology    Subjective:  No significant events overnight as discussed with staff, husband at bedside, he denies any significant events as well, patient denies any complaints today.  Objective: Vitals:   09/01/23 0000 09/01/23 0342 09/01/23 0500 09/01/23 0800  BP: (!) 148/68 (!) 159/65  (!) 155/67  Pulse: 62 65  65  Resp: 15 14  12   Temp: 98 F (36.7 C) (!) 97.5 F (36.4 C)  97.8 F (36.6 C)  TempSrc: Oral Axillary  Oral  SpO2: 94% 97%  98%  Weight:   49 kg  Height:        Intake/Output Summary (Last 24 hours) at 09/01/2023 1341 Last data filed at 09/01/2023 0900 Gross per 24 hour  Intake 240 ml  Output 500 ml  Net -260 ml   Filed Weights   08/30/23 0500 08/31/23 0500 09/01/23 0500  Weight: 55.5 kg 52.4 kg 49 kg    Examination:  Awake Alert, Oriented X 2, frail, extremely deconditioned, chronically  ill-appearing Symmetrical Chest wall movement, Good air movement bilaterally, CTAB RRR,No Gallops,Rubs or new Murmurs, No Parasternal Heave +ve B.Sounds, Abd Soft, No tenderness, No rebound - guarding or rigidity. No Cyanosis, Clubbing or edema, No new Rash or bruise     Data Reviewed: I have personally reviewed following labs and imaging studies  CBC: Recent Labs  Lab 08/29/23 1916 08/30/23 0313 08/31/23 0332 09/01/23 0628 09/01/23 1013  WBC 6.7 6.5 6.8 7.1  --   NEUTROABS 5.2  --   --   --   --   HGB 11.0* 9.9* 9.1* 10.3* 10.5*  HCT 35.1* 30.6* 27.9* 31.7* 33.0*  MCV 85.0 81.6 81.1 83.0  --   PLT 261 220 271 253  --     Basic Metabolic Panel: Recent Labs  Lab 08/29/23 1916 08/30/23 0313 08/31/23 0332 09/01/23 0628  NA 129* 131* 132* 133*  K 3.9 3.9 3.6 3.4*  CL 97* 100 103 98  CO2 19* 21* 22 25  GLUCOSE 109* 88 83 83  BUN 20 18 14 10   CREATININE 0.98 1.06* 0.97 1.00  CALCIUM 8.2* 8.0* 8.0* 8.0*  MG  --   --  1.6* 1.5*  PHOS  --   --  2.8 2.5    GFR: Estimated Creatinine Clearance: 37 mL/min (by C-G formula based on SCr of 1 mg/dL).  Liver Function Tests: Recent Labs  Lab 08/29/23 1916 08/30/23 0313  AST 24 20  ALT 25 22  ALKPHOS 120 112  BILITOT 0.6 0.4  PROT 5.2* 4.7*  ALBUMIN 2.2* 2.0*    CBG: Recent Labs  Lab 08/31/23 1206 09/01/23 0829 09/01/23 0832 09/01/23 0833  GLUCAP 93 67* 87 79     Recent Results (from the past 240 hour(s))  SARS Coronavirus 2 by RT PCR (hospital order, performed in [facility] hospital lab) *cepheid single result test* Anterior Nasal Swab     Status: None   Collection Time: 08/29/23  7:17 PM   Specimen: Anterior Nasal Swab  Result Value Ref Range Status   SARS Coronavirus 2 by RT PCR NEGATIVE NEGATIVE Final    Comment: Performed at [facility], [address] [place], [place] [address]  Urine Culture     Status: Abnormal   Collection Time: 08/29/23  8:12 PM   Specimen: In/Out Cath Urine  Result  Value Ref Range Status   Specimen Description IN/OUT CATH URINE  Final   Special Requests   Final    NONE Reflexed from [id number] Performed at [facility], [address], [place], [place] [address]    Culture >=100,000 COLONIES/mL ESCHERICHIA COLI (A)  Final   Report Status 08/31/2023 FINAL  Final   Organism ID, Bacteria ESCHERICHIA COLI (A)  Final      Susceptibility   Escherichia coli - MIC*    AMPICILLIN >=32 RESISTANT Resistant     CEFAZOLIN 8 SENSITIVE Sensitive     CEFEPIME <=0.12 SENSITIVE Sensitive     CEFTRIAXONE <=0.25 SENSITIVE Sensitive     CIPROFLOXACIN <=0.25 SENSITIVE Sensitive     GENTAMICIN >=  16 RESISTANT Resistant     IMIPENEM <=0.25 SENSITIVE Sensitive     NITROFURANTOIN <=16 SENSITIVE Sensitive     TRIMETH/SULFA >=320 RESISTANT Resistant     AMPICILLIN/SULBACTAM >=32 RESISTANT Resistant     PIP/TAZO <=4 SENSITIVE Sensitive     * >=100,000 COLONIES/mL ESCHERICHIA COLI  Blood culture (routine x 2)     Status: Abnormal   Collection Time: 08/29/23 10:33 PM   Specimen: BLOOD LEFT ARM  Result Value Ref Range Status   Specimen Description BLOOD LEFT ARM  Final   Special Requests   Final    BOTTLES DRAWN AEROBIC AND ANAEROBIC Blood Culture adequate volume   Culture  Setup Time   Final    GRAM NEGATIVE RODS IN BOTH AEROBIC AND ANAEROBIC BOTTLES CRITICAL RESULT CALLED TO, READ BACK BY AND VERIFIED WITH: Gwendolyn Fill AGEE [id number] FCP Performed at [facility], [address] [place], [place] [address]    Culture ESCHERICHIA COLI (A)  Final   Report Status 09/01/2023 FINAL  Final   Organism ID, Bacteria ESCHERICHIA COLI  Final      Susceptibility   Escherichia coli - MIC*    AMPICILLIN >=32 RESISTANT Resistant     CEFEPIME <=0.12 SENSITIVE Sensitive     CEFTAZIDIME <=1 SENSITIVE Sensitive     CEFTRIAXONE <=0.25 SENSITIVE Sensitive     CIPROFLOXACIN <=0.25 SENSITIVE Sensitive     GENTAMICIN >=16 RESISTANT Resistant     IMIPENEM <=0.25 SENSITIVE  Sensitive     TRIMETH/SULFA >=320 RESISTANT Resistant     AMPICILLIN/SULBACTAM >=32 RESISTANT Resistant     PIP/TAZO <=4 SENSITIVE Sensitive     * ESCHERICHIA COLI  Blood culture (routine x 2)     Status: Abnormal   Collection Time: 08/29/23 10:33 PM   Specimen: BLOOD RIGHT FOREARM  Result Value Ref Range Status   Specimen Description BLOOD RIGHT FOREARM  Final   Special Requests   Final    BOTTLES DRAWN AEROBIC AND ANAEROBIC Blood Culture adequate volume   Culture  Setup Time   Final    GRAM NEGATIVE RODS IN BOTH AEROBIC AND ANAEROBIC BOTTLES    Culture (A)  Final    ESCHERICHIA COLI SUSCEPTIBILITIES PERFORMED ON PREVIOUS CULTURE WITHIN THE LAST 5 DAYS. Performed at [facility], [address], [place], [place] [address]    Report Status 09/01/2023 FINAL  Final  Blood Culture ID Panel (Reflexed)     Status: Abnormal   Collection Time: 08/29/23 10:33 PM  Result Value Ref Range Status   Enterococcus faecalis NOT DETECTED NOT DETECTED Final   Enterococcus Faecium NOT DETECTED NOT DETECTED Final   Listeria monocytogenes NOT DETECTED NOT DETECTED Final   Staphylococcus species NOT DETECTED NOT DETECTED Final   Staphylococcus aureus (BCID) NOT DETECTED NOT DETECTED Final   Staphylococcus epidermidis NOT DETECTED NOT DETECTED Final   Staphylococcus lugdunensis NOT DETECTED NOT DETECTED Final   Streptococcus species NOT DETECTED NOT DETECTED Final   Streptococcus agalactiae NOT DETECTED NOT DETECTED Final   Streptococcus pneumoniae NOT DETECTED NOT DETECTED Final   Streptococcus pyogenes NOT DETECTED NOT DETECTED Final   A.calcoaceticus-baumannii NOT DETECTED NOT DETECTED Final   Bacteroides fragilis NOT DETECTED NOT DETECTED Final   Enterobacterales DETECTED (A) NOT DETECTED Final    Comment: Enterobacterales represent a large order of gram negative bacteria, not a single organism. CRITICAL RESULT CALLED TO, READ BACK BY AND VERIFIED WITH: PHARMD BAILEY AGEE 1159 [id number]  FCP    Enterobacter cloacae complex  NOT DETECTED NOT DETECTED Final   Escherichia coli DETECTED (A) NOT DETECTED Final    Comment: CRITICAL RESULT CALLED TO, READ BACK BY AND VERIFIED WITH: PHARMD BAILEY AGEE [id number] [id number] FCP    Klebsiella aerogenes NOT DETECTED NOT DETECTED Final   Klebsiella oxytoca NOT DETECTED NOT DETECTED Final   Klebsiella pneumoniae NOT DETECTED NOT DETECTED Final   Proteus species NOT DETECTED NOT DETECTED Final   Salmonella species NOT DETECTED NOT DETECTED Final   Serratia marcescens NOT DETECTED NOT DETECTED Final   Haemophilus influenzae NOT DETECTED NOT DETECTED Final   Neisseria meningitidis NOT DETECTED NOT DETECTED Final   Pseudomonas aeruginosa NOT DETECTED NOT DETECTED Final   Stenotrophomonas maltophilia NOT DETECTED NOT DETECTED Final   Candida albicans NOT DETECTED NOT DETECTED Final   Candida auris NOT DETECTED NOT DETECTED Final   Candida glabrata NOT DETECTED NOT DETECTED Final   Candida krusei NOT DETECTED NOT DETECTED Final   Candida parapsilosis NOT DETECTED NOT DETECTED Final   Candida tropicalis NOT DETECTED NOT DETECTED Final   Cryptococcus neoformans/gattii NOT DETECTED NOT DETECTED Final   CTX-M ESBL NOT DETECTED NOT DETECTED Final   Carbapenem resistance IMP NOT DETECTED NOT DETECTED Final   Carbapenem resistance KPC NOT DETECTED NOT DETECTED Final   Carbapenem resistance NDM NOT DETECTED NOT DETECTED Final   Carbapenem resist OXA 48 LIKE NOT DETECTED NOT DETECTED Final   Carbapenem resistance VIM NOT DETECTED NOT DETECTED Final    Comment: Performed at [facility], [address] [place], [place] [address]  MRSA Next Gen by PCR, Nasal     Status: None   Collection Time: 08/30/23  2:20 PM   Specimen: Nasal Mucosa; Nasal Swab  Result Value Ref Range Status   MRSA by PCR Next Gen NOT DETECTED NOT DETECTED Final    Comment: (NOTE) The GeneXpert MRSA Assay (FDA approved for NASAL specimens only), is one component of a  comprehensive MRSA colonization surveillance program. It is not intended to diagnose MRSA infection nor to guide or monitor treatment for MRSA infections. Test performance is not FDA approved in patients less than 80 years old. Performed at [facility], [address] [place], [place] [address]          Radiology Studies: DG C-Arm 1-60 Min  Result Date: 08/30/2023 CLINICAL DATA:  Elective surgery. Cystoscopy with retrograde pyelogram, left ureteral stent placement. EXAM: DG C-ARM 1-60 MIN CONTRAST:  Not provided. FLUOROSCOPY: Fluoroscopy Time:  45 seconds Radiation Exposure Index (if provided by the fluoroscopic device): 4.4829 mGy Number of Acquired Spot Images: 1 COMPARISON:  CT yesterday FINDINGS: Single fluoroscopic spot view of the abdomen obtained. A ureteral stent is present. Multiple left renal calculi. IMPRESSION: Intraoperative fluoroscopy demonstrating ureteral stent in place. Electronically Signed   By: Narda Rutherford M.D.   On: 08/30/2023 20:49        Scheduled Meds:  amiodarone  200 mg Oral Daily   Chlorhexidine Gluconate Cloth  6 each Topical Daily   colchicine  0.6 mg Oral Daily   feeding supplement  237 mL Oral BID BM   irbesartan  150 mg Oral Daily   lamoTRIgine  150 mg Oral Daily   methimazole  10 mg Oral Daily   metoprolol tartrate  25 mg Oral BID   montelukast  10 mg Oral QHS   Oxcarbazepine  300 mg Oral BID   pantoprazole  40 mg Oral Daily   potassium chloride  40 mEq Oral Q6H  pravastatin  40 mg Oral Daily   pyridostigmine  60 mg Oral Q8H   sertraline  50 mg Oral Daily   spironolactone  12.5 mg Oral Daily   Continuous Infusions:  cefTRIAXone (ROCEPHIN)  IV 2 g (08/31/23 1423)   magnesium sulfate bolus IVPB       LOS: 3 days      Huey Bienenstock, MD [facility]   To contact the attending provider between 7A-7P or the covering provider during after hours 7P-7A, please log into the web site [email or web address] and access using  universal [place] password for that web site. If you do not have the password, please call the hospital operator.  09/01/2023, 1:41 PM

## 2023-09-02 DIAGNOSIS — N3001 Acute cystitis with hematuria: Secondary | ICD-10-CM | POA: Diagnosis not present

## 2023-09-02 LAB — BASIC METABOLIC PANEL
Anion gap: 12 (ref 5–15)
BUN: 9 mg/dL (ref 8–23)
CO2: 22 mmol/L (ref 22–32)
Calcium: 8.1 mg/dL — ABNORMAL LOW (ref 8.9–10.3)
Chloride: 98 mmol/L (ref 98–111)
Creatinine, Ser: 0.88 mg/dL (ref 0.44–1.00)
GFR, Estimated: >60 mL/min (ref >60)
Glucose, Bld: 80 mg/dL (ref 70–99)
Potassium: 4.4 mmol/L (ref 3.5–5.1)
Sodium: 132 mmol/L — ABNORMAL LOW (ref 135–145)

## 2023-09-02 LAB — CBC
HCT: 34.1 % — ABNORMAL LOW (ref 36.0–46.0)
Hemoglobin: 11.1 g/dL — ABNORMAL LOW (ref 12.0–15.0)
MCH: 26.8 pg (ref 26.0–34.0)
MCHC: 32.6 g/dL (ref 30.0–36.0)
MCV: 82.4 fL (ref 80.0–100.0)
Platelets: 275 10*3/uL (ref 150–400)
RBC: 4.14 MIL/uL (ref 3.87–5.11)
RDW: 16.4 % — ABNORMAL HIGH (ref 11.5–15.5)
WBC: 6.5 10*3/uL (ref 4.0–10.5)
nRBC: 0 % (ref 0.0–0.2)

## 2023-09-02 LAB — GLUCOSE, CAPILLARY
Glucose-Capillary: 84 mg/dL (ref 70–99)
Glucose-Capillary: 94 mg/dL (ref 70–99)
Glucose-Capillary: 102 mg/dL — ABNORMAL HIGH (ref 70–99)

## 2023-09-02 LAB — PHOSPHORUS: Phosphorus: 2 mg/dL — ABNORMAL LOW (ref 2.5–4.6)

## 2023-09-02 LAB — MAGNESIUM: Magnesium: 1.9 mg/dL (ref 1.7–2.4)

## 2023-09-02 NOTE — Progress Notes (Signed)
PROGRESS NOTE    Kristen Hansen  QVZ:[id number] DOB: [birth year] DOA: 08/29/2023 PCP: Kerin Salen, PA-C   Chief Complaint  Patient presents with   Altered Mental Status    Brief Narrative:   Kristen Hansen is a 77 y.o. female with medical history significant of PMH of RA, lewy-body dementia, REM sleep d/o, prior DVT s/p treatment, Sjogren's syndrome, chronic diastolic CHF, orthostatic hypotension,CKDIII,GERD, hyperthyroidism,mitral stenosis and hypertension. Patient also has interim history of diagnosis of  pericarditis, Paroxsymal,atrial fibrillation, right pleural effusion s/p throacentesis/chest tube, during recent hospitalization 7//31-8/14. Patient now presents to ED due to increased confusion, her workup significant for UTI, she is admitted for further workup.     Assessment & Plan:   Principal Problem:   UTI (urinary tract infection)    E. coli UTI, infected ureteral stone and bacteremia -Blood culture growing E. coli, follow urine cultures, imaging CT abdomen pelvis showing 12.5 x 1.7 obstructing left U PJ stone, -Urology input greatly appreciated, went OR for left ureteral stent placement Dr. McDermit on 9/5 -Continue with IV fluids -Continue with as needed pain medications -Culture growing E. coli, sensitive to ceftriaxone, will continue   Acute Metabolic Encephalopathy on baseline demention - due to acute infection  -treat underlying cause   -CT head  with no acute findings.   Hx Pericarditis  (diagnosed hospitalization 7//31-8/14) Hx of Exudative pleural effusion ( now resolved) -presumed due to Autoimmune cause with hx of RA - continue on colchicine 0.6 Mg p.o. daily , with plans to complete 3 mo course   Paroxsymal Atrial fibrillation -continue on amiodarone, metoprolol, DOAC, hold Eliquis for 24 to 48 hours given some mild hematuria.  Hemoglobin is stable.   CHF pef  - no acute exacerbation  -continue with metoprolol/lasix, arb  ,spironolactone -recent cardiac cath clean c's   Hx of Dysphagia  - thought be related to Lewy body dementia progression -must maintain aspiration precautions    Lewy body dementia  -follows with neurology out patient  -On pyridostigmine    Orthostatic hypotension -continue with ted hose   Hyperthyrodism -noted elevated tsh  -hold tapazole for now  f/u on t3/t4 and titrate med as needed   Hypertension -stable bp  - continue  home regimen    HLD -continue with statin   Hx of DVT -s/p treatment  -no dvt noted no last dvt doppler studies   CKDIII - cr at baseline    GERD -ppi     Rheumatoid Arthritis/ Sjogren's syndrome  -Resume home regimen as able    Chronic Back pain /spinal stenosis lumbar region -supportive care    Depression - continue on SSRi   Trigeminal neuralgia  - continue on lamictal and trileptal   DVT prophylaxis: Eliquis on hold due to hematuria Code Status: DNR Family Communication: Discussed with husband at bedside Disposition: SNF when bed is available  Status is: Inpatient    Consultants:  Palliative medicine  Cardiology  Urology    Subjective:  No significant events overnight as discussed with husband at bedside, patient denies any complaints  Objective: Vitals:   09/01/23 2010 09/01/23 2300 09/02/23 0416 09/02/23 0800  BP:  (!) 161/76 (!) 143/73 (!) 169/68  Pulse: 64  66 66  Resp: 15  18 12   Temp:  97.6 F (36.4 C) (!) 97.5 F (36.4 C) 98.2 F (36.8 C)  TempSrc:  Oral Oral Oral  SpO2: 99%  99% 99%  Weight:      Height:  Intake/Output Summary (Last 24 hours) at 09/02/2023 1220 Last data filed at 09/02/2023 0616 Gross per 24 hour  Intake --  Output 150 ml  Net -150 ml   Filed Weights   08/30/23 0500 08/31/23 0500 09/01/23 0500  Weight: 55.5 kg 52.4 kg 49 kg    Examination:  Awake Alert, Oriented X 2, frail, extremely deconditioned, chronically ill-appearing Symmetrical Chest wall movement, Good air  movement bilaterally, CTAB RRR,No Gallops,Rubs or new Murmurs, No Parasternal Heave +ve B.Sounds, Abd Soft, No tenderness, No rebound - guarding or rigidity. No Cyanosis, Clubbing or edema, No new Rash or bruise     Data Reviewed: I have personally reviewed following labs and imaging studies  CBC: Recent Labs  Lab 08/29/23 1916 08/30/23 0313 08/31/23 0332 09/01/23 0628 09/01/23 1013 09/02/23 0908  WBC 6.7 6.5 6.8 7.1  --  6.5  NEUTROABS 5.2  --   --   --   --   --   HGB 11.0* 9.9* 9.1* 10.3* 10.5* 11.1*  HCT 35.1* 30.6* 27.9* 31.7* 33.0* 34.1*  MCV 85.0 81.6 81.1 83.0  --  82.4  PLT 261 220 271 253  --  275    Basic Metabolic Panel: Recent Labs  Lab 08/29/23 1916 08/30/23 0313 08/31/23 0332 09/01/23 0628 09/02/23 0758  NA 129* 131* 132* 133* 132*  K 3.9 3.9 3.6 3.4* 4.4  CL 97* 100 103 98 98  CO2 19* 21* 22 25 22   GLUCOSE 109* 88 83 83 80  BUN 20 18 14 10 9   CREATININE 0.98 1.06* 0.97 1.00 0.88  CALCIUM 8.2* 8.0* 8.0* 8.0* 8.1*  MG  --   --  1.6* 1.5* 1.9  PHOS  --   --  2.8 2.5 2.0*    GFR: Estimated Creatinine Clearance: 42.1 mL/min (by C-G formula based on SCr of 0.88 mg/dL).  Liver Function Tests: Recent Labs  Lab 08/29/23 1916 08/30/23 0313  AST 24 20  ALT 25 22  ALKPHOS 120 112  BILITOT 0.6 0.4  PROT 5.2* 4.7*  ALBUMIN 2.2* 2.0*    CBG: Recent Labs  Lab 08/31/23 1206 09/01/23 0829 09/01/23 0832 09/01/23 0833 09/02/23 0812  GLUCAP 93 67* 87 79 84     Recent Results (from the past 240 hour(s))  SARS Coronavirus 2 by RT PCR (hospital order, performed in [facility] lab) *cepheid single result test* Anterior Nasal Swab     Status: None   Collection Time: 08/29/23  7:17 PM   Specimen: Anterior Nasal Swab  Result Value Ref Range Status   SARS Coronavirus 2 by RT PCR NEGATIVE NEGATIVE Final    Comment: Performed at [facility] Lab, [address], [place], [place] [address]  Urine Culture     Status: Abnormal   Collection  Time: 08/29/23  8:12 PM   Specimen: In/Out Cath Urine  Result Value Ref Range Status   Specimen Description IN/OUT CATH URINE  Final   Special Requests   Final    NONE Reflexed from [id number] Performed at [facility] Lab, [address] [place], [place] [address]    Culture >=100,000 COLONIES/mL ESCHERICHIA COLI (A)  Final   Report Status 08/31/2023 FINAL  Final   Organism ID, Bacteria ESCHERICHIA COLI (A)  Final      Susceptibility   Escherichia coli - MIC*    AMPICILLIN >=32 RESISTANT Resistant     CEFAZOLIN 8 SENSITIVE Sensitive     CEFEPIME <=0.12 SENSITIVE Sensitive     CEFTRIAXONE <=  0.25 SENSITIVE Sensitive     CIPROFLOXACIN <=0.25 SENSITIVE Sensitive     GENTAMICIN >=16 RESISTANT Resistant     IMIPENEM <=0.25 SENSITIVE Sensitive     NITROFURANTOIN <=16 SENSITIVE Sensitive     TRIMETH/SULFA >=320 RESISTANT Resistant     AMPICILLIN/SULBACTAM >=32 RESISTANT Resistant     PIP/TAZO <=4 SENSITIVE Sensitive     * >=100,000 COLONIES/mL ESCHERICHIA COLI  Blood culture (routine x 2)     Status: Abnormal   Collection Time: 08/29/23 10:33 PM   Specimen: BLOOD LEFT ARM  Result Value Ref Range Status   Specimen Description BLOOD LEFT ARM  Final   Special Requests   Final    BOTTLES DRAWN AEROBIC AND ANAEROBIC Blood Culture adequate volume   Culture  Setup Time   Final    GRAM NEGATIVE RODS IN BOTH AEROBIC AND ANAEROBIC BOTTLES CRITICAL RESULT CALLED TO, READ BACK BY AND VERIFIED WITH: Gwendolyn Fill AGEE [id number] [id number] FCP Performed at [facility] Lab, [address] [place], [place] [address]    Culture ESCHERICHIA COLI (A)  Final   Report Status 09/01/2023 FINAL  Final   Organism ID, Bacteria ESCHERICHIA COLI  Final      Susceptibility   Escherichia coli - MIC*    AMPICILLIN >=32 RESISTANT Resistant     CEFEPIME <=0.12 SENSITIVE Sensitive     CEFTAZIDIME <=1 SENSITIVE Sensitive     CEFTRIAXONE <=0.25 SENSITIVE Sensitive     CIPROFLOXACIN <=0.25 SENSITIVE Sensitive      GENTAMICIN >=16 RESISTANT Resistant     IMIPENEM <=0.25 SENSITIVE Sensitive     TRIMETH/SULFA >=320 RESISTANT Resistant     AMPICILLIN/SULBACTAM >=32 RESISTANT Resistant     PIP/TAZO <=4 SENSITIVE Sensitive     * ESCHERICHIA COLI  Blood culture (routine x 2)     Status: Abnormal   Collection Time: 08/29/23 10:33 PM   Specimen: BLOOD RIGHT FOREARM  Result Value Ref Range Status   Specimen Description BLOOD RIGHT FOREARM  Final   Special Requests   Final    BOTTLES DRAWN AEROBIC AND ANAEROBIC Blood Culture adequate volume   Culture  Setup Time   Final    GRAM NEGATIVE RODS IN BOTH AEROBIC AND ANAEROBIC BOTTLES    Culture (A)  Final    ESCHERICHIA COLI SUSCEPTIBILITIES PERFORMED ON PREVIOUS CULTURE WITHIN THE LAST 5 DAYS. Performed at [facility], [address] [place], [place] [address]    Report Status 09/01/2023 FINAL  Final  Blood Culture ID Panel (Reflexed)     Status: Abnormal   Collection Time: 08/29/23 10:33 PM  Result Value Ref Range Status   Enterococcus faecalis NOT DETECTED NOT DETECTED Final   Enterococcus Faecium NOT DETECTED NOT DETECTED Final   Listeria monocytogenes NOT DETECTED NOT DETECTED Final   Staphylococcus species NOT DETECTED NOT DETECTED Final   Staphylococcus aureus (BCID) NOT DETECTED NOT DETECTED Final   Staphylococcus epidermidis NOT DETECTED NOT DETECTED Final   Staphylococcus lugdunensis NOT DETECTED NOT DETECTED Final   Streptococcus species NOT DETECTED NOT DETECTED Final   Streptococcus agalactiae NOT DETECTED NOT DETECTED Final   Streptococcus pneumoniae NOT DETECTED NOT DETECTED Final   Streptococcus pyogenes NOT DETECTED NOT DETECTED Final   A.calcoaceticus-baumannii NOT DETECTED NOT DETECTED Final   Bacteroides fragilis NOT DETECTED NOT DETECTED Final   Enterobacterales DETECTED (A) NOT DETECTED Final    Comment: Enterobacterales represent a large order of gram negative bacteria, not a single organism. CRITICAL RESULT CALLED TO,  READ BACK  BY AND VERIFIED WITH: PHARMD BAILEY AGEE [id number] FCP    Enterobacter cloacae complex NOT DETECTED NOT DETECTED Final   Escherichia coli DETECTED (A) NOT DETECTED Final    Comment: CRITICAL RESULT CALLED TO, READ BACK BY AND VERIFIED WITH: PHARMD BAILEY AGEE [phone] [id number] FCP    Klebsiella aerogenes NOT DETECTED NOT DETECTED Final   Klebsiella oxytoca NOT DETECTED NOT DETECTED Final   Klebsiella pneumoniae NOT DETECTED NOT DETECTED Final   Proteus species NOT DETECTED NOT DETECTED Final   Salmonella species NOT DETECTED NOT DETECTED Final   Serratia marcescens NOT DETECTED NOT DETECTED Final   Haemophilus influenzae NOT DETECTED NOT DETECTED Final   Neisseria meningitidis NOT DETECTED NOT DETECTED Final   Pseudomonas aeruginosa NOT DETECTED NOT DETECTED Final   Stenotrophomonas maltophilia NOT DETECTED NOT DETECTED Final   Candida albicans NOT DETECTED NOT DETECTED Final   Candida auris NOT DETECTED NOT DETECTED Final   Candida glabrata NOT DETECTED NOT DETECTED Final   Candida krusei NOT DETECTED NOT DETECTED Final   Candida parapsilosis NOT DETECTED NOT DETECTED Final   Candida tropicalis NOT DETECTED NOT DETECTED Final   Cryptococcus neoformans/gattii NOT DETECTED NOT DETECTED Final   CTX-M ESBL NOT DETECTED NOT DETECTED Final   Carbapenem resistance IMP NOT DETECTED NOT DETECTED Final   Carbapenem resistance KPC NOT DETECTED NOT DETECTED Final   Carbapenem resistance NDM NOT DETECTED NOT DETECTED Final   Carbapenem resist OXA 48 LIKE NOT DETECTED NOT DETECTED Final   Carbapenem resistance VIM NOT DETECTED NOT DETECTED Final    Comment: Performed at [facility] Lab, [address] [place], [place] [address]  MRSA Next Gen by PCR, Nasal     Status: None   Collection Time: 08/30/23  2:20 PM   Specimen: Nasal Mucosa; Nasal Swab  Result Value Ref Range Status   MRSA by PCR Next Gen NOT DETECTED NOT DETECTED Final    Comment: (NOTE) The GeneXpert MRSA Assay (FDA  approved for NASAL specimens only), is one component of a comprehensive MRSA colonization surveillance program. It is not intended to diagnose MRSA infection nor to guide or monitor treatment for MRSA infections. Test performance is not FDA approved in patients less than 48 years old. Performed at [facility], [address], [place], [place] [address]          Radiology Studies: No results found.      Scheduled Meds:  amiodarone  200 mg Oral Daily   Chlorhexidine Gluconate Cloth  6 each Topical Daily   colchicine  0.6 mg Oral Daily   feeding supplement  237 mL Oral BID BM   irbesartan  150 mg Oral Daily   lamoTRIgine  150 mg Oral Daily   methimazole  10 mg Oral Daily   metoprolol tartrate  25 mg Oral BID   montelukast  10 mg Oral QHS   Oxcarbazepine  300 mg Oral BID   pantoprazole  40 mg Oral Daily   pravastatin  40 mg Oral Daily   pyridostigmine  60 mg Oral Q8H   sertraline  50 mg Oral Daily   spironolactone  12.5 mg Oral Daily   Continuous Infusions:  cefTRIAXone (ROCEPHIN)  IV 2 g (09/01/23 1545)     LOS: 4 days      Huey Bienenstock, MD [facility]   To contact the attending provider between 7A-7P or the covering provider during after hours 7P-7A, please log into the web site [email or web address] and access using universal  [place] password for that web site. If you do not have the password, please call the hospital operator.  09/02/2023, 12:20 PM

## 2023-09-02 NOTE — Plan of Care (Signed)

## 2023-09-02 NOTE — Plan of Care (Signed)

## 2023-09-02 NOTE — Plan of Care (Signed)
  Problem: Education: Goal: Knowledge of General Education information will improve Description: Including pain rating scale, medication(s)/side effects and non-pharmacologic comfort measures Outcome: Progressing   Problem: Health Behavior/Discharge Planning: Goal: Ability to manage health-related needs will improve Outcome: Progressing   Problem: Clinical Measurements: Goal: Will remain free from infection Outcome: Progressing Goal: Respiratory complications will improve Outcome: Progressing   Problem: Activity: Goal: Risk for activity intolerance will decrease Outcome: Progressing   Problem: Nutrition: Goal: Adequate nutrition will be maintained Outcome: Progressing   Problem: Coping: Goal: Level of anxiety will decrease Outcome: Progressing   Problem: Elimination: Goal: Will not experience complications related to bowel motility Outcome: Progressing

## 2023-09-03 DIAGNOSIS — I82539 Chronic embolism and thrombosis of unspecified popliteal vein: Secondary | ICD-10-CM

## 2023-09-03 DIAGNOSIS — N3001 Acute cystitis with hematuria: Secondary | ICD-10-CM | POA: Diagnosis not present

## 2023-09-03 LAB — PHOSPHORUS: Phosphorus: 2.5 mg/dL (ref 2.5–4.6)

## 2023-09-03 LAB — GLUCOSE, CAPILLARY
Glucose-Capillary: 87 mg/dL (ref 70–99)
Glucose-Capillary: 80 mg/dL (ref 70–99)

## 2023-09-03 LAB — BASIC METABOLIC PANEL
Anion gap: 9 (ref 5–15)
BUN: 7 mg/dL — ABNORMAL LOW (ref 8–23)
CO2: 25 mmol/L (ref 22–32)
Calcium: 8.1 mg/dL — ABNORMAL LOW (ref 8.9–10.3)
Chloride: 98 mmol/L (ref 98–111)
Creatinine, Ser: 0.91 mg/dL (ref 0.44–1.00)
GFR, Estimated: >60 mL/min (ref >60)
Glucose, Bld: 83 mg/dL (ref 70–99)
Potassium: 4.1 mmol/L (ref 3.5–5.1)
Sodium: 132 mmol/L — ABNORMAL LOW (ref 135–145)

## 2023-09-03 LAB — CBC
HCT: 33.8 % — ABNORMAL LOW (ref 36.0–46.0)
Hemoglobin: 10.6 g/dL — ABNORMAL LOW (ref 12.0–15.0)
MCH: 26.2 pg (ref 26.0–34.0)
MCHC: 31.4 g/dL (ref 30.0–36.0)
MCV: 83.7 fL (ref 80.0–100.0)
Platelets: 262 10*3/uL (ref 150–400)
RBC: 4.04 MIL/uL (ref 3.87–5.11)
RDW: 16.5 % — ABNORMAL HIGH (ref 11.5–15.5)
WBC: 7.4 10*3/uL (ref 4.0–10.5)
nRBC: 0 % (ref 0.0–0.2)

## 2023-09-03 LAB — MAGNESIUM: Magnesium: 2 mg/dL (ref 1.7–2.4)

## 2023-09-03 MED ORDER — CEPHALEXIN 500 MG PO CAPS: 500.0000 mg | ORAL_CAPSULE | Freq: Four times a day (QID) | ORAL | Status: AC

## 2023-09-03 MED ORDER — CEPHALEXIN 500 MG PO CAPS: 500.0000 mg | ORAL_CAPSULE | Freq: Four times a day (QID) | ORAL | Status: DC

## 2023-09-03 MED ORDER — ENSURE ENLIVE PO LIQD: 237.0000 mL | Freq: Two times a day (BID) | ORAL | Status: AC

## 2023-09-03 NOTE — TOC Progression Note (Signed)
Transition of Care [facility]) - Progression Note    Patient Details  Name: Kristen Hansen MRN: [id number] Date of Birth: 06-Mar-1946  Transition of Care [facility]) CM/SW Contact  Mearl Latin, LCSW Phone Number: 09/03/2023, 12:19 PM  Clinical Narrative:    CSW received insurance approval for [employer]. CSW met with patient and spouse at bedside and made them aware. CSW again discussed copays with spouse and stated he believes they will stay at [facility] for a few days and then return home with the private duty nursing he has set up for her. They requested [facility] for transport.      Barriers to Discharge: Barriers Resolved  Expected Discharge Plan and Services In-house Referral: Clinical Social Work     Living arrangements for the past 2 months: Skilled Nursing Facility, Single Family Home Expected Discharge Date: 09/03/23                                     Social Determinants of Health (SDOH) Interventions SDOH Screenings   Food Insecurity: No Food Insecurity (08/30/2023)  Housing: Low Risk  (08/30/2023)  Transportation Needs: No Transportation Needs (08/30/2023)  Utilities: Not At Risk (08/30/2023)  Financial Resource Strain: Low Risk  (08/22/2022)   [facility], [facility] prior to 02/24/2023., [facility] visits prior to 02/24/2023.  Physical Activity: Inactive (08/22/2022)   [facility], [facility] visits prior to 02/24/2023., [facility] visits prior to 02/24/2023.  Social Connections: Socially Integrated (08/22/2022)   [facility], [facility] visits prior to 02/24/2023., [facility] visits prior to 02/24/2023.  Stress: No Stress Concern Present (08/22/2022)   [facility] prior to 02/24/2023.,  [facility] visits prior to 02/24/2023.  Tobacco Use: Low Risk  (08/30/2023)    Readmission Risk Interventions    08/08/2023    3:02 PM  Readmission Risk Prevention Plan  Transportation Screening Complete  PCP or Specialist Appt within 3-5 Days Complete  Medication Review (RN Care Manager) Complete

## 2023-09-03 NOTE — TOC Progression Note (Signed)
Transition of Care [facility]) - Progression Note    Patient Details  Name: Kristen Hansen MRN: [id number] Date of Birth: 1946-12-13  [facility]) CM/SW Contact  Mearl Latin, LCSW Phone Number: 09/03/2023, 9:37 AM  Clinical Narrative:    Insurance authorization is still pending for [employer]. CSW sent message to insurance to make sure nothing else is needed from CSW at this time.      Barriers to Discharge: Continued Medical Work up, Conservator, museum/gallery and Services In-house Referral: Clinical Social Work     Living arrangements for the past 2 months: Skilled Holiday representative, Single Family Home                                       Social Determinants of Health (SDOH) Interventions SDOH Screenings   Food Insecurity: No Food Insecurity (08/30/2023)  Housing: Low Risk  (08/30/2023)  Transportation Needs: No Transportation Needs (08/30/2023)  Utilities: Not At Risk (08/30/2023)  Financial Resource Strain: Low Risk  (08/22/2022)   Received from [facility], [facility] prior to 02/24/2023., [facility], [facility] visits prior to 02/24/2023.  Physical Activity: Inactive (08/22/2022)   [facility], [facility] prior to 02/24/2023., [facility] visits prior to 02/24/2023.  Social Connections: Socially Integrated (08/22/2022)   [facility], [facility] visits prior to 02/24/2023., [facility] visits prior to 02/24/2023.  Stress: No Stress Concern Present (08/22/2022)   [facility], [facility], [facility] visits prior to 02/24/2023., [facility] visits prior to 02/24/2023.  Tobacco Use: Low Risk  (08/30/2023)    Readmission Risk Interventions    08/08/2023    3:02 PM  Readmission Risk  Prevention Plan  Transportation Screening Complete  PCP or Specialist Appt within 3-5 Days Complete  Medication Review (RN Care Manager) Complete

## 2023-09-03 NOTE — Progress Notes (Signed)
   4 Days Post-Op Subjective: Patient up in chair accompanied by her husband.  She is more alert this afternoon.  Objective: Vital signs in last 24 hours: Temp:  [97.4 F (36.3 C)-98.4 F (36.9 C)] 97.6 F (36.4 C) (09/09 0838) Pulse Rate:  [64-69] 69 (09/09 0838) Resp:  [13-18] 18 (09/09 0838) BP: (117-161)/(64-72) 117/64 (09/09 0838) SpO2:  [98 %-100 %] 99 % (09/09 0981)  Assessment/Plan:  Intake/Output from previous day: 09/08 0701 - 09/09 0700 In: -  Out: 200 [Urine:200] # UTI Resistant E. coli UTI, sensitive to Rocephin which patient is already receiving.   # Ureteral stone CT A/P shows 1.2 x 1.7 obstructing left UPJ stone. S/p stent with Dr. Sherron Monday on 08/30/2023.  Follow-up is being arranged for definitive stone management with [facility].  #hematuria Eliquis appears to have been discontinued.  Very small amount of urine in vacuum canister but the hematuria appears to have resolved.  Urology will sign off at this time.  Please feel free to contact us with questions or concerns.  Patient okay for discharge once cleared from medical perspective.  Intake/Output this shift: No intake/output data recorded.  Physical Exam:  General: Alert and oriented CV: No cyanosis Lungs: equal chest rise Gu: Foley catheter in place draining clear yellow urine.  Lab Results: Recent Labs    09/01/23 1013 09/02/23 0908 09/03/23 0549  HGB 10.5* 11.1* 10.6*  HCT 33.0* 34.1* 33.8*   BMET Recent Labs    09/02/23 0758 09/03/23 0549  NA 132* 132*  K 4.4 4.1  CL 98 98  CO2 22 25  GLUCOSE 80 83  BUN 9 7*  CREATININE 0.88 0.91  CALCIUM 8.1* 8.1*     Studies/Results: No results found.    LOS: 5 days   Elmon Kirschner, NP [facility] Pager: [phone]  09/03/2023, 10:42 AM

## 2023-09-03 NOTE — TOC Transition Note (Signed)
Transition of Care [facility]) - CM/SW Discharge Note   Patient Details  Name: Kristen Hansen MRN: [id number] Date of Birth: 1946/08/13  Transition of Care [facility]) CM/SW Contact:  Mearl Latin, LCSW Phone Number: 09/03/2023, 2:14 PM   Clinical Narrative:    Patient will DC to: [facility] Anticipated DC date: 09/03/23 Family notified: Spouse Transport by: Sharin Mons    Per MD patient ready for DC to [facility]. RN to call report prior to discharge [phone] room 610a). RN, patient, patient's family, and facility notified of DC. Discharge Summary and FL2 sent to facility. DC packet on chart including signed DNR. Ambulance transport requested for patient.   CSW will sign off for now as social work intervention is no longer needed. Please consult [place] again if new needs arise.     Final next level of care: Skilled Nursing Facility Barriers to Discharge: Barriers Resolved   Patient Goals and CMS Choice CMS Medicare.gov Compare Post Acute Care list provided to:: Patient Represenative (must comment) Choice offered to / list presented to : Patient, Spouse  Discharge Placement     Existing PASRR number confirmed : 09/03/23          Patient chooses bed at: [facility] Patient to be transferred to facility by: [facility] Name of family member notified: [name] Patient and family notified of of transfer: 09/03/23  Discharge Plan and Services Additional resources added to the After Visit Summary for   In-house Referral: Clinical Social Work                                   Social Determinants of Health (SDOH) Interventions SDOH Screenings   Food Insecurity: No Food Insecurity (08/30/2023)  Housing: Low Risk  (08/30/2023)  Transportation Needs: No Transportation Needs (08/30/2023)  Utilities: Not At Risk (08/30/2023)  Financial Resource Strain: Low Risk  (08/22/2022)   [facility] from [facility], [facility] prior to 02/24/2023., [facility]  [facility] visits prior to 02/24/2023.  Physical Activity: Inactive (08/22/2022)   Received from [facility], [facility] prior to 02/24/2023., [facility] visits prior to 02/24/2023.  Social Connections: Socially Integrated (08/22/2022)   [facility], [facility] prior to 02/24/2023., [facility] visits prior to 02/24/2023.  Stress: No Stress Concern Present (08/22/2022)   [facility], [facility] prior to 02/24/2023., [facility] visits prior to 02/24/2023.  Tobacco Use: Low Risk  (08/30/2023)     Readmission Risk Interventions    08/08/2023    3:02 PM  Readmission Risk Prevention Plan  Transportation Screening Complete  PCP or Specialist Appt within 3-5 Days Complete  Medication Review (RN Care Manager) Complete

## 2023-09-03 NOTE — Care Management Important Message (Signed)
Important Message  Patient Details  Name: Kristen Hansen MRN: [id number] Date of Birth: 06-25-1946   Medicare Important Message Given:  Yes     [name] 09/03/2023, 3:09 PM

## 2023-09-03 NOTE — Discharge Summary (Signed)
Physician Discharge Summary  Kristen Hansen NWG:[id number] DOB: [birth year] DOA: 08/29/2023  PCP: Kerin Salen, PA-C  Admit date: 08/29/2023 Discharge date: 09/03/2023  Admitted From: (SNF) Disposition:  (SNF)  Recommendations for Outpatient Follow-up:  Please obtain BMP, CBC in 1 week Please check free T4, T3, and TSH in 4 to 6 weeks, and resume methimazole if she has any signs of hyperthyroidism, meanwhile it remains on hold. Patient to follow with urology as an outpatient, Dr. MacDirmid regarding definitive management of her stones, appointment has been arranged by [facility]     CODE STATUS: (DNR) Diet recommendation:  Regular   Brief/Interim Summary:  Kristen Hansen is a 77 y.o. female with medical history significant of PMH of RA, lewy-body dementia, REM sleep d/o, prior DVT s/p treatment, Sjogren's syndrome, chronic diastolic CHF, orthostatic hypotension,CKDIII,GERD, hyperthyroidism,mitral stenosis and hypertension. Patient also has interim history of diagnosis of  pericarditis, Paroxsymal,atrial fibrillation, right pleural effusion s/p throacentesis/chest tube, during recent hospitalization 7//31-8/14. Patient now presents to ED due to increased confusion, her workup significant for UTI, she is admitted for further workup.  He was to have ureteral stone, with mild hydronephrosis, as well blood culture growing E. coli, he went for left ureteral stent placement 9/5, sepsis has resolved, patient stable for discharge back to SNF.    E. coli UTI, infected ureteral stone and bacteremia -Blood culture growing E. coli, follow urine cultures, imaging CT abdomen pelvis showing 12.5 x 1.7 obstructing left U PJ stone, -Urology input greatly appreciated, went OR for left ureteral stent placement Dr. McDermit on 9/5, she was treated with IV Rocephin during hospital stay, she will receive another 10 days of Keflex upon discharge given infected kidney stone and ureteral stent, she will  follow-up with Dr. France Ravens as an outpatient regarding definitive management of her stones.  As an outpatient -Had some hematuria postprocedure, but her hemoglobin has been stable, Eliquis has been held for couple days, it did resolve currently.    Acute Metabolic Encephalopathy on baseline demention - due to acute infection  -CT head  with no acute findings.   Hx Pericarditis  (diagnosed hospitalization 7//31-8/14) Hx of Exudative pleural effusion ( now resolved) -presumed due to Autoimmune cause with hx of RA - continue on colchicine 0.6 Mg p.o. daily , with plans to complete 3 mo course   Paroxsymal Atrial fibrillation -continue on amiodarone, metoprolol, and Eliquis on discharge    CHF pef  - no acute exacerbation  -continue with metoprolol/lasix, arb ,spironolactone -recent cardiac cath clean c's   Hx of Dysphagia  - thought be related to Lewy body dementia progression -must maintain aspiration precautions    Lewy body dementia  -follows with neurology out patient  -On pyridostigmine    Orthostatic hypotension -continue with ted hose   Hyperthyrodism -TSH elevated at 6.326, it was significantly low just last month on 07/26/2023 (<1.010), but her free T4, T3 are low (free T40.34), T3 low at 56), so her methimazole will be held for now, and recommend repeat labs in 4 to 6 weeks to decide whether it should be resumed or not.    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    Activity: As tolerated with Full fall precautions use walker/cane & assistance as needed   Disposition SNF   Diet: Regular diet   On your next visit with your primary care physician please Get Medicines reviewed and adjusted.   Please request your Prim.MD to go over all Hospital Tests and Procedure/Radiological results at the follow up, please get all Hospital records sent to your Prim MD by signing hospital release before you go home.   If you experience worsening of your admission symptoms, develop shortness of breath, life threatening emergency, suicidal or homicidal thoughts you must seek medical attention immediately by calling 911 or calling your MD immediately  if symptoms less severe.  You Must read complete instructions/literature along with all the possible adverse reactions/side effects for all the Medicines you take and that have been prescribed to you. Take any new Medicines after you have completely understood and accpet all the possible adverse reactions/side effects.   Do not drive, operating heavy machinery, perform activities at heights, swimming or participation in water activities or provide baby sitting services if your were admitted for syncope or siezures until you have seen by Primary MD or a Neurologist and advised to do so again.  Do not drive when taking Pain medications.    Do not take more than prescribed Pain,  Sleep and Anxiety Medications  Special Instructions: If you have smoked or chewed Tobacco  in the last 2 yrs please stop smoking, stop any regular Alcohol  and or any Recreational drug use.  Wear Seat belts while driving.   Please note  You were cared for by a hospitalist during your hospital stay. If you have any questions about your discharge medications or the care you received while you were in the hospital after you are discharged, you can call the unit and asked to speak with the hospitalist on call if the hospitalist that took care of you is not available. Once you are discharged, your primary care physician will handle any further medical issues. Please note that NO REFILLS for any discharge medications will be authorized once you are discharged, as it is imperative that you return to your primary care physician (or establish a relationship with a primary care physician if you do not have one) for your aftercare needs so that they can reassess your need for medications and monitor your lab values.   Increase activity slowly   Complete by: As directed    No wound care   Complete by: As directed       Allergies as of 09/03/2023       Reactions  Morphine And Codeine Hives   Prevacid [lansoprazole] Other (See Comments)   unknown   Provigil [modafinil] Other (See Comments)   dizziness   Adhesive [tape] Rash   bandaids         Medication List     STOP taking these medications    sulfamethoxazole-trimethoprim 800-160 MG tablet Commonly known as: BACTRIM DS       TAKE these medications    acetaminophen 325 MG tablet Commonly known as: TYLENOL Take 2 tablets (650 mg total) by mouth every 6 (six) hours as needed for mild pain (or Fever >/= 101).   amiodarone 200 MG tablet Commonly known as: PACERONE Take 1 tablet (200 mg total) by mouth daily.   apixaban 5 MG Tabs tablet Commonly known as: ELIQUIS Take 1 tablet (5 mg total) by mouth 2 (two) times daily.    cephALEXin 500 MG capsule Commonly known as: KEFLEX Take 1 capsule (500 mg total) by mouth 4 (four) times daily for 10 days.   colchicine 0.6 MG tablet Take 1 tablet (0.6 mg total) by mouth daily.   feeding supplement Liqd Take 237 mLs by mouth 2 (two) times daily between meals.   Guaifenesin 200 MG/5ML Liqd Take 10 mLs by mouth every 4 (four) hours as needed (cough and congestion).   irbesartan 150 MG tablet Commonly known as: AVAPRO Take 1 tablet (150 mg total) by mouth daily.   lamoTRIgine 150 MG tablet Commonly known as: LAMICTAL Take 1 tablet by mouth once daily   meclizine 12.5 MG tablet Commonly known as: ANTIVERT Take 12.5 mg by mouth every 8 (eight) hours as needed.   methimazole 10 MG tablet Commonly known as: TAPAZOLE Take 1 tablet (10 mg total) by mouth daily.   metoprolol tartrate 25 MG tablet Commonly known as: LOPRESSOR Take 1 tablet (25 mg total) by mouth 2 (two) times daily.   montelukast 10 MG tablet Commonly known as: SINGULAIR Take 10 mg by mouth at bedtime.   mouth rinse Liqd solution 15 mLs by Mouth Rinse route as needed (oral care).   nitroGLYCERIN 0.4 MG SL tablet Commonly known as: NITROSTAT Place 1 tablet (0.4 mg total) under the tongue every 5 (five) minutes as needed for chest pain.   Oxcarbazepine 300 MG tablet Commonly known as: TRILEPTAL Take 1 tablet (300 mg total) by mouth 2 (two) times daily.   pantoprazole 40 MG tablet Commonly known as: PROTONIX Take 1 tablet (40 mg total) by mouth daily.   polyethylene glycol 17 g packet Commonly known as: MIRALAX / GLYCOLAX Take 17 g by mouth daily as needed for mild constipation.   pravastatin 40 MG tablet Commonly known as: PRAVACHOL Take 40 mg by mouth daily before breakfast.   pyridostigmine 60 MG tablet Commonly known as: MESTINON Take 1 tablet (60 mg total) by mouth every 8 (eight) hours.   sertraline 50 MG tablet Commonly known as: ZOLOFT Take 1 tablet by mouth once  daily   spironolactone 25 MG tablet Commonly known as: ALDACTONE Take 0.5 tablets (12.5 mg total) by mouth daily.   valACYclovir 500 MG tablet Commonly known as: VALTREX Take 1,000 mg by mouth every 12 (twelve) hours as needed (for fever blisters).        Allergies  Allergen Reactions   Morphine And Codeine Hives   Prevacid [Lansoprazole] Other (See Comments)    unknown   Provigil [Modafinil] Other (See Comments)    dizziness   Adhesive [Tape] Rash    bandaids  Consultations: Urology Palliative medicine   Procedures/Studies: DG C-Arm 1-60 Min  Result Date: 08/30/2023 CLINICAL DATA:  Elective surgery. Cystoscopy with retrograde pyelogram, left ureteral stent placement. EXAM: DG C-ARM 1-60 MIN CONTRAST:  Not provided. FLUOROSCOPY: Fluoroscopy Time:  45 seconds Radiation Exposure Index (if provided by the fluoroscopic device): 4.4829 mGy Number of Acquired Spot Images: 1 COMPARISON:  CT yesterday FINDINGS: Single fluoroscopic spot view of the abdomen obtained. A ureteral stent is present. Multiple left renal calculi. IMPRESSION: Intraoperative fluoroscopy demonstrating ureteral stent in place. Electronically Signed   By: Narda Rutherford M.D.   On: 08/30/2023 20:49   DG Chest Port 1 View  Result Date: 08/29/2023 CLINICAL DATA:  Altered mental status. EXAM: PORTABLE CHEST 1 VIEW COMPARISON:  08/07/2023, CT 07/31/2023 FINDINGS: Normal heart size and mediastinal contours. Mild blunting of the right costophrenic angle likely related to scarring a possible trace pleural effusion. No confluent consolidation or pneumothorax. Normal pulmonary vasculature. IMPRESSION: Mild blunting of the right costophrenic angle likely related to scarring a possible trace pleural effusion. Electronically Signed   By: Narda Rutherford M.D.   On: 08/29/2023 20:49   CT HEAD WO CONTRAST ( )  Result Date: 08/29/2023 CLINICAL DATA:  Mental status change. EXAM: CT HEAD WITHOUT CONTRAST TECHNIQUE: Contiguous  axial images were obtained from the base of the skull through the vertex without intravenous contrast. RADIATION DOSE REDUCTION: This exam was performed according to the departmental dose-optimization program which includes automated exposure control, adjustment of the mA and/or kV according to patient size and/or use of iterative reconstruction technique. COMPARISON:  MRI and CT scan head from 08/06/2023. FINDINGS: Brain: No evidence of acute infarction, hemorrhage, hydrocephalus, extra-axial collection or mass lesion/mass effect. 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 There is bilateral periventricular hypodensity, which is non-specific but most likely seen in the settings of microvascular ischemic changes. Mild in extent. Vascular: No hyperdense vessel or unexpected calcification. Intracranial arteriosclerosis. Skull: Normal. Negative for fracture or focal lesion. Sinuses/Orbits: No acute finding. Other: There are soft tissue attenuation areas in bilateral external auditory canal, likely cerumen. IMPRESSION: No acute intracranial abnormality. Electronically Signed   By: Jules Schick M.D.   On: 08/29/2023 20:19   CT Renal Stone Study  Result Date: 08/29/2023 CLINICAL DATA:  Abdominal/flank pain, stone suspected EXAM: CT ABDOMEN AND PELVIS WITHOUT CONTRAST TECHNIQUE: Multidetector CT imaging of the abdomen and pelvis was performed following the standard protocol without IV contrast. RADIATION DOSE REDUCTION: This exam was performed according to the departmental dose-optimization program which includes automated exposure control, adjustment of the mA and/or kV according to patient size and/or use of iterative reconstruction technique. COMPARISON:  CT scan abdomen and pelvis report from 11/14/2016. FINDINGS: Lower chest: The lung bases are clear. At least small right pleural effusion, partially imaged. No left pleural effusion. The heart is normal in size. No pericardial  effusion. Partially seen bilateral breast implants. Hepatobiliary: The liver is normal in size. Non-cirrhotic configuration. No suspicious mass. No intrahepatic or extrahepatic bile duct dilation. No calcified gallstones. Normal gallbladder wall thickness. No pericholecystic inflammatory changes. Pancreas: Unremarkable. No pancreatic ductal dilatation or surrounding inflammatory changes. Spleen: Within normal limits. No focal lesion. Adrenals/Urinary Tract: Adrenal glands are unremarkable. No suspicious renal mass seen within the limitations of this unenhanced exam. There is a 1.2 x 1.7 cm calculus in the left pelviureteric junction causing moderate hydronephrosis. There are additional multiple subcentimeter sized calculi in the left renal collecting system. Left ureter is nondilated. No right nephroureterolithiasis  or obstructive uropathy. 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 Urinary bladder is not diagnostically evaluated due to extensive streak artifacts. Stomach/Bowel: No disproportionate dilation of the small or large bowel loops. No evidence of abnormal bowel wall thickening or inflammatory changes. The appendix was not visualized; however there is no acute inflammatory process in the right lower quadrant. Vascular/Lymphatic: No ascites or pneumoperitoneum. No abdominal or pelvic lymphadenopathy, by size criteria. No aneurysmal dilation of the major abdominal arteries. There are mild peripheral atherosclerotic vascular calcifications of the aorta and its major branches. Reproductive: Not diagnostically evaluated due to extensive streak artifacts. Other: The visualized soft tissues and abdominal wall are unremarkable. Musculoskeletal: No suspicious osseous lesions. There are moderate multilevel degenerative changes in the visualized spine. IMPRESSION: 1. There is a 1.7 cm left pelvic ureteric junction calculus causing moderate  hydronephrosis. There are additional several subcentimeter sized calculi in the left kidney. 2. No right nephroureterolithiasis or obstructive uropathy. 3. Multiple other nonacute observations, as described above. Electronically Signed   By: Jules Schick M.D.   On: 08/29/2023 20:15   DG CHEST PORT 1 VIEW  Result Date: 08/07/2023 CLINICAL DATA:  Aspiration pneumonia EXAM: PORTABLE CHEST 1 VIEW COMPARISON:  08/03/2023 FINDINGS: Heart and mediastinum are normal. Small, layering bilateral pleural effusions. No acute appearing airspace opacity. Osseous structures unremarkable. IMPRESSION: Small layering bilateral pleural effusions without acute airspace opacity. Electronically Signed   By: Jearld Lesch M.D.   On: 08/07/2023 14:01   EEG adult  Result Date: 08/06/2023 Charlsie Quest, MD     08/06/2023  4:27 PM Patient Name: Kennan Stankowski [place] MRN: [id number] Epilepsy Attending: Charlsie Quest Referring Physician/Provider: Barnetta Chapel, MD Date: 08/06/2023 Duration: 22.22 mins Patient history: 77yo F with ams getting eeg to evaluate for seizure Level of alertness: Awake AEDs during EEG study: [employer] Technical aspects: This EEG study was done with scalp electrodes positioned according to the 10-20 International system of electrode placement. Electrical activity was reviewed with band pass filter of 1-70Hz , sensitivity of 7 uV/mm, display speed of 59mm/sec with a 60Hz  notched filter applied as appropriate. EEG data were recorded continuously and digitally stored.  Video monitoring was available and reviewed as appropriate. Description: The posterior dominant rhythm consists of 7 Hz activity of moderate voltage (25-35 uV) seen predominantly in posterior head regions, symmetric and reactive to eye opening and eye closing. EEG showed continuous generalized predominantly 5 to 7 Hz theta slowing admixed with intermittent generalized 2-3hz  delta slowing. Hyperventilation and photic stimulation were not performed.    ABNORMALITY - Continuous slow, generalized IMPRESSION: This study is suggestive of moderate diffuse encephalopathy, nonspecific etiology. No seizures or epileptiform discharges were seen throughout the recording. Charlsie Quest   MR BRAIN WO CONTRAST  Result Date: 08/06/2023 CLINICAL DATA:  Transient ischemic attack. EXAM: MRI HEAD WITHOUT CONTRAST TECHNIQUE: Multiplanar, multiecho pulse sequences of the brain and surrounding structures were obtained without intravenous contrast. COMPARISON:  Brain MRI 07/31/2023. FINDINGS: Brain: No acute infarct or hemorrhage. 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 No mass or midline shift. No hydrocephalus or extra-axial collection. Few foci of chronic hemosiderin deposition in the deep cerebral white matter. Vascular: Normal flow voids. Skull and upper cervical spine: Normal marrow signal. Sinuses/Orbits: No acute findings. Other: None. IMPRESSION: 1. No acute intracranial process. 2. Unchanged mild chronic small-vessel disease and old lacunar infarct in the right cerebellar  hemisphere. Electronically Signed   By: Orvan Falconer M.D.   On: 08/06/2023 16:04   CT HEAD WO CONTRAST ( )  Result Date: 08/06/2023 CLINICAL DATA:  Transient ischemic attack. EXAM: CT HEAD WITHOUT CONTRAST TECHNIQUE: Contiguous axial images were obtained from the base of the skull through the vertex without intravenous contrast. RADIATION DOSE REDUCTION: This exam was performed according to the departmental dose-optimization program which includes automated exposure control, adjustment of the mA and/or kV according to patient size and/or use of iterative reconstruction technique. COMPARISON:  Brain MRI 624.  Head CT 07/31/2023. FINDINGS: Brain: No acute hemorrhage. Unchanged mild chronic small-vessel disease with old lacunar infarct in the right cerebellar hemisphere. Cortical gray-white differentiation is otherwise preserved.  Prominence of the ventricles and sulci within expected range for age. No hydrocephalus or extra-axial collection. No mass effect or midline shift. Vascular: No hyperdense vessel or unexpected calcification. Skull: No calvarial fracture or suspicious bone lesion. Skull base is unremarkable. Sinuses/Orbits: No acute finding. Other: None. IMPRESSION: 1. No acute intracranial abnormality. 2. Unchanged mild chronic small-vessel disease with old lacunar infarct in the right cerebellar hemisphere. Electronically Signed   By: Orvan Falconer M.D.   On: 08/06/2023 15:58      Subjective: No significant events overnight as discussed with husband, patient denies any complaints today, husband reports she had a good day yesterday with good appetite.  Discharge Exam: Vitals:   09/03/23 0838 09/03/23 1148  BP: 117/64 (!) 148/60  Pulse: 69 60  Resp: 18 17  Temp: 97.6 F (36.4 C) 97.6 F (36.4 C)  SpO2: 99% 100%   Vitals:   09/03/23 0300 09/03/23 0404 09/03/23 0838 09/03/23 1148  BP: (!) 161/72 (!) 148/67 117/64 (!) 148/60  Pulse:   69 60  Resp:   18 17  Temp: 97.8 F (36.6 C)  97.6 F (36.4 C) 97.6 F (36.4 C)  TempSrc: Oral  Oral Oral  SpO2:   99% 100%  Weight:      Height:        General: Pt is alert, awake, not in acute distress, frail, deconditioned, she is with dementia at baseline ,oriented x 1 Cardiovascular: RRR, S1/S2 +, no rubs, no gallops Respiratory: CTA bilaterally, no wheezing, no rhonchi Abdominal: Soft, NT, ND, bowel sounds + Extremities: no edema, no cyanosis    The results of significant diagnostics from this hospitalization (including imaging, microbiology, ancillary and laboratory) are listed below for reference.     Microbiology: Recent Results (from the past 240 hour(s))  SARS Coronavirus 2 by RT PCR (hospital order, performed in [facility] hospital lab) *cepheid single result test* Anterior Nasal Swab     Status: None   Collection Time: 08/29/23  7:17 PM    Specimen: Anterior Nasal Swab  Result Value Ref Range Status   SARS Coronavirus 2 by RT PCR NEGATIVE NEGATIVE Final    Comment: Performed at [facility], [address], [place], [place] [address]  Urine Culture     Status: Abnormal   Collection Time: 08/29/23  8:12 PM   Specimen: In/Out Cath Urine  Result Value Ref Range Status   Specimen Description IN/OUT CATH URINE  Final   Special Requests   Final    NONE Reflexed from [id number] Performed at [facility], [address], [place], [place] [address]    Culture >=100,000 COLONIES/mL ESCHERICHIA COLI (A)  Final   Report Status 08/31/2023 FINAL  Final   Organism ID, Bacteria ESCHERICHIA COLI (A)  Final      Susceptibility   Escherichia coli - MIC*    AMPICILLIN >=32 RESISTANT Resistant     CEFAZOLIN 8 SENSITIVE Sensitive     CEFEPIME <=0.12 SENSITIVE Sensitive     CEFTRIAXONE <=0.25 SENSITIVE Sensitive     CIPROFLOXACIN <=0.25 SENSITIVE Sensitive     GENTAMICIN >=16 RESISTANT Resistant     IMIPENEM <=0.25 SENSITIVE Sensitive     NITROFURANTOIN <=16 SENSITIVE Sensitive     TRIMETH/SULFA >=320 RESISTANT Resistant     AMPICILLIN/SULBACTAM >=32 RESISTANT Resistant     PIP/TAZO <=4 SENSITIVE Sensitive     * >=100,000 COLONIES/mL ESCHERICHIA COLI  Blood culture (routine x 2)     Status: Abnormal   Collection Time: 08/29/23 10:33 PM   Specimen: BLOOD LEFT ARM  Result Value Ref Range Status   Specimen Description BLOOD LEFT ARM  Final   Special Requests   Final    BOTTLES DRAWN AEROBIC AND ANAEROBIC Blood Culture adequate volume   Culture  Setup Time   Final    GRAM NEGATIVE RODS IN BOTH AEROBIC AND ANAEROBIC BOTTLES CRITICAL RESULT CALLED TO, READ BACK BY AND VERIFIED WITH: Gwendolyn Fill AGEE [id number] FCP Performed at [facility], [address] [place], [place] [address]    Culture ESCHERICHIA COLI (A)  Final   Report Status 09/01/2023 FINAL  Final   Organism ID, Bacteria ESCHERICHIA COLI  Final       Susceptibility   Escherichia coli - MIC*    AMPICILLIN >=32 RESISTANT Resistant     CEFEPIME <=0.12 SENSITIVE Sensitive     CEFTAZIDIME <=1 SENSITIVE Sensitive     CEFTRIAXONE <=0.25 SENSITIVE Sensitive     CIPROFLOXACIN <=0.25 SENSITIVE Sensitive     GENTAMICIN >=16 RESISTANT Resistant     IMIPENEM <=0.25 SENSITIVE Sensitive     TRIMETH/SULFA >=320 RESISTANT Resistant     AMPICILLIN/SULBACTAM >=32 RESISTANT Resistant     PIP/TAZO <=4 SENSITIVE Sensitive     * ESCHERICHIA COLI  Blood culture (routine x 2)     Status: Abnormal   Collection Time: 08/29/23 10:33 PM   Specimen: BLOOD RIGHT FOREARM  Result Value Ref Range Status   Specimen Description BLOOD RIGHT FOREARM  Final   Special Requests   Final    BOTTLES DRAWN AEROBIC AND ANAEROBIC Blood Culture adequate volume   Culture  Setup Time   Final    GRAM NEGATIVE RODS IN BOTH AEROBIC AND ANAEROBIC BOTTLES    Culture (A)  Final    ESCHERICHIA COLI SUSCEPTIBILITIES PERFORMED ON PREVIOUS CULTURE WITHIN THE LAST 5 DAYS. Performed at [facility], [address] [place], [place] [address]    Report Status 09/01/2023 FINAL  Final  Blood Culture ID Panel (Reflexed)     Status: Abnormal   Collection Time: 08/29/23 10:33 PM  Result Value Ref Range Status   Enterococcus faecalis NOT DETECTED NOT DETECTED Final   Enterococcus Faecium NOT DETECTED NOT DETECTED Final   Listeria monocytogenes NOT DETECTED NOT DETECTED Final   Staphylococcus species NOT DETECTED NOT DETECTED Final   Staphylococcus aureus (BCID) NOT DETECTED NOT DETECTED Final   Staphylococcus epidermidis NOT DETECTED NOT DETECTED Final   Staphylococcus lugdunensis NOT DETECTED NOT DETECTED Final   Streptococcus species NOT DETECTED NOT DETECTED Final   Streptococcus agalactiae NOT DETECTED NOT DETECTED Final   Streptococcus pneumoniae NOT DETECTED NOT DETECTED Final   Streptococcus pyogenes NOT DETECTED NOT DETECTED Final   A.calcoaceticus-baumannii NOT DETECTED  NOT DETECTED  Final   Bacteroides fragilis NOT DETECTED NOT DETECTED Final   Enterobacterales DETECTED (A) NOT DETECTED Final    Comment: Enterobacterales represent a large order of gram negative bacteria, not a single organism. CRITICAL RESULT CALLED TO, READ BACK BY AND VERIFIED WITH: PHARMD BAILEY AGEE [phone] [id number] FCP    Enterobacter cloacae complex NOT DETECTED NOT DETECTED Final   Escherichia coli DETECTED (A) NOT DETECTED Final    Comment: CRITICAL RESULT CALLED TO, READ BACK BY AND VERIFIED WITH: PHARMD BAILEY AGEE [id number] FCP    Klebsiella aerogenes NOT DETECTED NOT DETECTED Final   Klebsiella oxytoca NOT DETECTED NOT DETECTED Final   Klebsiella pneumoniae NOT DETECTED NOT DETECTED Final   Proteus species NOT DETECTED NOT DETECTED Final   Salmonella species NOT DETECTED NOT DETECTED Final   Serratia marcescens NOT DETECTED NOT DETECTED Final   Haemophilus influenzae NOT DETECTED NOT DETECTED Final   Neisseria meningitidis NOT DETECTED NOT DETECTED Final   Pseudomonas aeruginosa NOT DETECTED NOT DETECTED Final   Stenotrophomonas maltophilia NOT DETECTED NOT DETECTED Final   Candida albicans NOT DETECTED NOT DETECTED Final   Candida auris NOT DETECTED NOT DETECTED Final   Candida glabrata NOT DETECTED NOT DETECTED Final   Candida krusei NOT DETECTED NOT DETECTED Final   Candida parapsilosis NOT DETECTED NOT DETECTED Final   Candida tropicalis NOT DETECTED NOT DETECTED Final   Cryptococcus neoformans/gattii NOT DETECTED NOT DETECTED Final   CTX-M ESBL NOT DETECTED NOT DETECTED Final   Carbapenem resistance IMP NOT DETECTED NOT DETECTED Final   Carbapenem resistance KPC NOT DETECTED NOT DETECTED Final   Carbapenem resistance NDM NOT DETECTED NOT DETECTED Final   Carbapenem resist OXA 48 LIKE NOT DETECTED NOT DETECTED Final   Carbapenem resistance VIM NOT DETECTED NOT DETECTED Final    Comment: Performed at [facility], [address] [place], [place] [address]  MRSA  Next Gen by PCR, Nasal     Status: None   Collection Time: 08/30/23  2:20 PM   Specimen: Nasal Mucosa; Nasal Swab  Result Value Ref Range Status   MRSA by PCR Next Gen NOT DETECTED NOT DETECTED Final    Comment: (NOTE) The GeneXpert MRSA Assay (FDA approved for NASAL specimens only), is one component of a comprehensive MRSA colonization surveillance program. It is not intended to diagnose MRSA infection nor to guide or monitor treatment for MRSA infections. Test performance is not FDA approved in patients less than 99 years old. Performed at [facility], [address], [place], [place] [address]      Labs: BNP (last 3 results) Recent Labs    07/31/23 0714 08/01/23 0638 08/02/23 0149  BNP 356.1* 664.7* 744.2*   Basic Metabolic Panel: Recent Labs  Lab 08/30/23 0313 08/31/23 0332 09/01/23 0628 09/02/23 0758 09/03/23 0549  NA 131* 132* 133* 132* 132*  K 3.9 3.6 3.4* 4.4 4.1  CL 100 103 98 98 98  CO2 21* 22 25 22 25   GLUCOSE 88 83 83 80 83  BUN 18 14 10 9  7*  CREATININE 1.06* 0.97 1.00 0.88 0.91  CALCIUM 8.0* 8.0* 8.0* 8.1* 8.1*  MG  --  1.6* 1.5* 1.9 2.0  PHOS  --  2.8 2.5 2.0* 2.5   Liver Function Tests: Recent Labs  Lab 08/29/23 1916 08/30/23 0313  AST 24 20  ALT 25 22  ALKPHOS 120 112  BILITOT 0.6 0.4  PROT 5.2* 4.7*  ALBUMIN 2.2* 2.0*   No results for input(s): "  LIPASE", "AMYLASE" in the last 168 hours. No results for input(s): "AMMONIA" in the last 168 hours. CBC: Recent Labs  Lab 08/29/23 1916 08/30/23 0313 08/31/23 0332 09/01/23 0628 09/01/23 1013 09/02/23 0908 09/03/23 0549  WBC 6.7 6.5 6.8 7.1  --  6.5 7.4  NEUTROABS 5.2  --   --   --   --   --   --   HGB 11.0* 9.9* 9.1* 10.3* 10.5* 11.1* 10.6*  HCT 35.1* 30.6* 27.9* 31.7* 33.0* 34.1* 33.8*  MCV 85.0 81.6 81.1 83.0  --  82.4 83.7  PLT 261 220 271 253  --  275 262   Cardiac Enzymes: No results for input(s): "CKTOTAL", "CKMB", "CKMBINDEX", "TROPONINI" in the last 168  hours. BNP: Invalid input(s): "POCBNP" CBG: Recent Labs  Lab 09/02/23 0812 09/02/23 1341 09/02/23 2059 09/03/23 0838 09/03/23 1146  GLUCAP 84 94 102* 87 80   D-Dimer No results for input(s): "DDIMER" in the last 72 hours. Hgb A1c No results for input(s): "HGBA1C" in the last 72 hours. Lipid Profile No results for input(s): "CHOL", "HDL", "LDLCALC", "TRIG", "CHOLHDL", "LDLDIRECT" in the last 72 hours. Thyroid function studies No results for input(s): "TSH", "T4TOTAL", "T3FREE", "THYROIDAB" in the last 72 hours.  Invalid input(s): "FREET3" Anemia work up No results for input(s): "VITAMINB12", "FOLATE", "FERRITIN", "TIBC", "IRON", "RETICCTPCT" in the last 72 hours. Urinalysis    Component Value Date/Time   COLORURINE AMBER (A) 08/29/2023 2012   APPEARANCEUR CLOUDY (A) 08/29/2023 2012   LABSPEC 1.014 08/29/2023 2012   PHURINE 5.0 08/29/2023 2012   GLUCOSEU NEGATIVE 08/29/2023 2012   HGBUR LARGE (A) 08/29/2023 2012   BILIRUBINUR NEGATIVE 08/29/2023 2012   KETONESUR NEGATIVE 08/29/2023 2012   PROTEINUR 100 (A) 08/29/2023 2012   UROBILINOGEN 0.2 01/16/2013 1429   NITRITE NEGATIVE 08/29/2023 2012   LEUKOCYTESUR LARGE (A) 08/29/2023 2012   Sepsis Labs Recent Labs  Lab 08/31/23 0332 09/01/23 0628 09/02/23 0908 09/03/23 0549  WBC 6.8 7.1 6.5 7.4   Microbiology Recent Results (from the past 240 hour(s))  SARS Coronavirus 2 by RT PCR (hospital order, performed in [facility] hospital lab) *cepheid single result test* Anterior Nasal Swab     Status: None   Collection Time: 08/29/23  7:17 PM   Specimen: Anterior Nasal Swab  Result Value Ref Range Status   SARS Coronavirus 2 by RT PCR NEGATIVE NEGATIVE Final    Comment: Performed at [facility], [address] [place], [place] [address]  Urine Culture     Status: Abnormal   Collection Time: 08/29/23  8:12 PM   Specimen: In/Out Cath Urine  Result Value Ref Range Status   Specimen Description IN/OUT CATH URINE   Final   Special Requests   Final    NONE Reflexed from [id number] Performed at [facility], [address], [place], [place] [address]    Culture >=100,000 COLONIES/mL ESCHERICHIA COLI (A)  Final   Report Status 08/31/2023 FINAL  Final   Organism ID, Bacteria ESCHERICHIA COLI (A)  Final      Susceptibility   Escherichia coli - MIC*    AMPICILLIN >=32 RESISTANT Resistant     CEFAZOLIN 8 SENSITIVE Sensitive     CEFEPIME <=0.12 SENSITIVE Sensitive     CEFTRIAXONE <=0.25 SENSITIVE Sensitive     CIPROFLOXACIN <=0.25 SENSITIVE Sensitive     GENTAMICIN >=16 RESISTANT Resistant     IMIPENEM <=0.25 SENSITIVE Sensitive     NITROFURANTOIN <=16 SENSITIVE Sensitive     TRIMETH/SULFA >=320  RESISTANT Resistant     AMPICILLIN/SULBACTAM >=32 RESISTANT Resistant     PIP/TAZO <=4 SENSITIVE Sensitive     * >=100,000 COLONIES/mL ESCHERICHIA COLI  Blood culture (routine x 2)     Status: Abnormal   Collection Time: 08/29/23 10:33 PM   Specimen: BLOOD LEFT ARM  Result Value Ref Range Status   Specimen Description BLOOD LEFT ARM  Final   Special Requests   Final    BOTTLES DRAWN AEROBIC AND ANAEROBIC Blood Culture adequate volume   Culture  Setup Time   Final    GRAM NEGATIVE RODS IN BOTH AEROBIC AND ANAEROBIC BOTTLES CRITICAL RESULT CALLED TO, READ BACK BY AND VERIFIED WITH: Gwendolyn Fill AGEE [phone] FCP Performed at [facility], [address] [place], [place] [address]    Culture ESCHERICHIA COLI (A)  Final   Report Status 09/01/2023 FINAL  Final   Organism ID, Bacteria ESCHERICHIA COLI  Final      Susceptibility   Escherichia coli - MIC*    AMPICILLIN >=32 RESISTANT Resistant     CEFEPIME <=0.12 SENSITIVE Sensitive     CEFTAZIDIME <=1 SENSITIVE Sensitive     CEFTRIAXONE <=0.25 SENSITIVE Sensitive     CIPROFLOXACIN <=0.25 SENSITIVE Sensitive     GENTAMICIN >=16 RESISTANT Resistant     IMIPENEM <=0.25 SENSITIVE Sensitive     TRIMETH/SULFA >=320 RESISTANT Resistant      AMPICILLIN/SULBACTAM >=32 RESISTANT Resistant     PIP/TAZO <=4 SENSITIVE Sensitive     * ESCHERICHIA COLI  Blood culture (routine x 2)     Status: Abnormal   Collection Time: 08/29/23 10:33 PM   Specimen: BLOOD RIGHT FOREARM  Result Value Ref Range Status   Specimen Description BLOOD RIGHT FOREARM  Final   Special Requests   Final    BOTTLES DRAWN AEROBIC AND ANAEROBIC Blood Culture adequate volume   Culture  Setup Time   Final    GRAM NEGATIVE RODS IN BOTH AEROBIC AND ANAEROBIC BOTTLES    Culture (A)  Final    ESCHERICHIA COLI SUSCEPTIBILITIES PERFORMED ON PREVIOUS CULTURE WITHIN THE LAST 5 DAYS. Performed at [facility], [address], [place], [place] [address]    Report Status 09/01/2023 FINAL  Final  Blood Culture ID Panel (Reflexed)     Status: Abnormal   Collection Time: 08/29/23 10:33 PM  Result Value Ref Range Status   Enterococcus faecalis NOT DETECTED NOT DETECTED Final   Enterococcus Faecium NOT DETECTED NOT DETECTED Final   Listeria monocytogenes NOT DETECTED NOT DETECTED Final   Staphylococcus species NOT DETECTED NOT DETECTED Final   Staphylococcus aureus (BCID) NOT DETECTED NOT DETECTED Final   Staphylococcus epidermidis NOT DETECTED NOT DETECTED Final   Staphylococcus lugdunensis NOT DETECTED NOT DETECTED Final   Streptococcus species NOT DETECTED NOT DETECTED Final   Streptococcus agalactiae NOT DETECTED NOT DETECTED Final   Streptococcus pneumoniae NOT DETECTED NOT DETECTED Final   Streptococcus pyogenes NOT DETECTED NOT DETECTED Final   A.calcoaceticus-baumannii NOT DETECTED NOT DETECTED Final   Bacteroides fragilis NOT DETECTED NOT DETECTED Final   Enterobacterales DETECTED (A) NOT DETECTED Final    Comment: Enterobacterales represent a large order of gram negative bacteria, not a single organism. CRITICAL RESULT CALLED TO, READ BACK BY AND VERIFIED WITH: PHARMD BAILEY AGEE [phone] [id number] FCP    Enterobacter cloacae complex NOT DETECTED NOT DETECTED  Final   Escherichia coli DETECTED (A) NOT DETECTED Final    Comment: CRITICAL RESULT CALLED TO, READ BACK BY  AND VERIFIED WITH: PHARMD BAILEY AGEE [phone] [id number] FCP    Klebsiella aerogenes NOT DETECTED NOT DETECTED Final   Klebsiella oxytoca NOT DETECTED NOT DETECTED Final   Klebsiella pneumoniae NOT DETECTED NOT DETECTED Final   Proteus species NOT DETECTED NOT DETECTED Final   Salmonella species NOT DETECTED NOT DETECTED Final   Serratia marcescens NOT DETECTED NOT DETECTED Final   Haemophilus influenzae NOT DETECTED NOT DETECTED Final   Neisseria meningitidis NOT DETECTED NOT DETECTED Final   Pseudomonas aeruginosa NOT DETECTED NOT DETECTED Final   Stenotrophomonas maltophilia NOT DETECTED NOT DETECTED Final   Candida albicans NOT DETECTED NOT DETECTED Final   Candida auris NOT DETECTED NOT DETECTED Final   Candida glabrata NOT DETECTED NOT DETECTED Final   Candida krusei NOT DETECTED NOT DETECTED Final   Candida parapsilosis NOT DETECTED NOT DETECTED Final   Candida tropicalis NOT DETECTED NOT DETECTED Final   Cryptococcus neoformans/gattii NOT DETECTED NOT DETECTED Final   CTX-M ESBL NOT DETECTED NOT DETECTED Final   Carbapenem resistance IMP NOT DETECTED NOT DETECTED Final   Carbapenem resistance KPC NOT DETECTED NOT DETECTED Final   Carbapenem resistance NDM NOT DETECTED NOT DETECTED Final   Carbapenem resist OXA 48 LIKE NOT DETECTED NOT DETECTED Final   Carbapenem resistance VIM NOT DETECTED NOT DETECTED Final    Comment: Performed at [facility], [address] [place], [place] [address]  MRSA Next Gen by PCR, Nasal     Status: None   Collection Time: 08/30/23  2:20 PM   Specimen: Nasal Mucosa; Nasal Swab  Result Value Ref Range Status   MRSA by PCR Next Gen NOT DETECTED NOT DETECTED Final    Comment: (NOTE) The GeneXpert MRSA Assay (FDA approved for NASAL specimens only), is one component of a comprehensive MRSA colonization surveillance program. It is not  intended to diagnose MRSA infection nor to guide or monitor treatment for MRSA infections. Test performance is not FDA approved in patients less than 46 years old. Performed at [facility], [address], [place], [place] [address]      Time coordinating discharge: Over 30 minutes  SIGNED:   Huey Bienenstock, MD  [facility] 09/03/2023, 11:55 AM Pager   If 7PM-7AM, please contact night-coverage [email or web address] Password [email or web address]

## 2023-09-06 DIAGNOSIS — N390 Urinary tract infection, site not specified: Secondary | ICD-10-CM | POA: Diagnosis not present

## 2023-09-06 DIAGNOSIS — I10 Essential (primary) hypertension: Secondary | ICD-10-CM | POA: Diagnosis not present

## 2023-09-06 DIAGNOSIS — Z7901 Long term (current) use of anticoagulants: Secondary | ICD-10-CM

## 2023-09-06 DIAGNOSIS — N200 Calculus of kidney: Secondary | ICD-10-CM | POA: Diagnosis not present

## 2023-09-06 DIAGNOSIS — N1831 Chronic kidney disease, stage 3a: Secondary | ICD-10-CM | POA: Diagnosis not present

## 2023-09-07 DIAGNOSIS — N1831 Chronic kidney disease, stage 3a: Secondary | ICD-10-CM | POA: Diagnosis not present

## 2023-09-07 DIAGNOSIS — N200 Calculus of kidney: Secondary | ICD-10-CM | POA: Diagnosis not present

## 2023-09-07 DIAGNOSIS — K2100 Gastro-esophageal reflux disease with esophagitis, without bleeding: Secondary | ICD-10-CM

## 2023-09-07 DIAGNOSIS — F32A Depression, unspecified: Secondary | ICD-10-CM

## 2023-09-07 DIAGNOSIS — M3500 Sicca syndrome, unspecified: Secondary | ICD-10-CM

## 2023-09-07 DIAGNOSIS — E782 Mixed hyperlipidemia: Secondary | ICD-10-CM | POA: Diagnosis not present

## 2023-09-07 DIAGNOSIS — I10 Essential (primary) hypertension: Secondary | ICD-10-CM | POA: Diagnosis not present

## 2023-09-07 DIAGNOSIS — E039 Hypothyroidism, unspecified: Secondary | ICD-10-CM

## 2023-09-07 DIAGNOSIS — M6281 Muscle weakness (generalized): Secondary | ICD-10-CM

## 2023-09-07 DIAGNOSIS — I480 Paroxysmal atrial fibrillation: Secondary | ICD-10-CM

## 2023-09-11 DIAGNOSIS — H8113 Benign paroxysmal vertigo, bilateral: Secondary | ICD-10-CM

## 2023-09-11 DIAGNOSIS — R1312 Dysphagia, oropharyngeal phase: Secondary | ICD-10-CM

## 2023-09-11 DIAGNOSIS — M3500 Sicca syndrome, unspecified: Secondary | ICD-10-CM

## 2023-09-11 DIAGNOSIS — E039 Hypothyroidism, unspecified: Secondary | ICD-10-CM

## 2023-09-11 DIAGNOSIS — R400 Somnolence: Secondary | ICD-10-CM

## 2023-09-11 DIAGNOSIS — I480 Paroxysmal atrial fibrillation: Secondary | ICD-10-CM

## 2023-09-11 DIAGNOSIS — F32A Depression, unspecified: Secondary | ICD-10-CM

## 2023-09-11 DIAGNOSIS — N179 Acute kidney failure, unspecified: Secondary | ICD-10-CM

## 2023-09-11 DIAGNOSIS — R2689 Other abnormalities of gait and mobility: Secondary | ICD-10-CM

## 2023-09-11 DIAGNOSIS — M6281 Muscle weakness (generalized): Secondary | ICD-10-CM

## 2023-09-12 ENCOUNTER — Encounter: Payer: Self-pay | Admitting: Pulmonary Disease

## 2023-09-12 DIAGNOSIS — K2100 Gastro-esophageal reflux disease with esophagitis, without bleeding: Secondary | ICD-10-CM

## 2023-09-12 DIAGNOSIS — E039 Hypothyroidism, unspecified: Secondary | ICD-10-CM

## 2023-09-12 DIAGNOSIS — R278 Other lack of coordination: Secondary | ICD-10-CM | POA: Diagnosis not present

## 2023-09-12 DIAGNOSIS — I480 Paroxysmal atrial fibrillation: Secondary | ICD-10-CM

## 2023-09-12 DIAGNOSIS — M3500 Sicca syndrome, unspecified: Secondary | ICD-10-CM | POA: Diagnosis not present

## 2023-09-12 DIAGNOSIS — I10 Essential (primary) hypertension: Secondary | ICD-10-CM | POA: Diagnosis not present

## 2023-09-12 DIAGNOSIS — E782 Mixed hyperlipidemia: Secondary | ICD-10-CM

## 2023-09-12 DIAGNOSIS — N1831 Chronic kidney disease, stage 3a: Secondary | ICD-10-CM | POA: Diagnosis not present

## 2023-09-12 DIAGNOSIS — N200 Calculus of kidney: Secondary | ICD-10-CM

## 2023-09-19 DIAGNOSIS — I10 Essential (primary) hypertension: Secondary | ICD-10-CM | POA: Diagnosis not present

## 2023-09-19 DIAGNOSIS — I480 Paroxysmal atrial fibrillation: Secondary | ICD-10-CM

## 2023-09-19 DIAGNOSIS — N200 Calculus of kidney: Secondary | ICD-10-CM

## 2023-09-19 DIAGNOSIS — E039 Hypothyroidism, unspecified: Secondary | ICD-10-CM

## 2023-09-19 DIAGNOSIS — K2100 Gastro-esophageal reflux disease with esophagitis, without bleeding: Secondary | ICD-10-CM | POA: Diagnosis not present

## 2023-09-19 DIAGNOSIS — E782 Mixed hyperlipidemia: Secondary | ICD-10-CM

## 2023-09-19 DIAGNOSIS — M3500 Sicca syndrome, unspecified: Secondary | ICD-10-CM | POA: Diagnosis not present

## 2023-09-19 DIAGNOSIS — N1831 Chronic kidney disease, stage 3a: Secondary | ICD-10-CM | POA: Diagnosis not present

## 2023-09-19 DIAGNOSIS — M6281 Muscle weakness (generalized): Secondary | ICD-10-CM

## 2023-10-10 ENCOUNTER — Institutional Professional Consult (permissible substitution): Payer: Medicare PPO | Admitting: Pulmonary Disease

## 2023-10-16 ENCOUNTER — Ambulatory Visit: Payer: Medicare PPO | Admitting: Primary Care

## 2023-10-16 ENCOUNTER — Ambulatory Visit: Payer: Medicare PPO

## 2023-10-16 ENCOUNTER — Encounter: Payer: Self-pay | Admitting: Primary Care

## 2023-10-16 VITALS — BP 130/74 | HR 70 | Ht 70.0 in | Wt 113.2 lb

## 2023-10-16 DIAGNOSIS — J90 Pleural effusion, not elsewhere classified: Secondary | ICD-10-CM | POA: Diagnosis not present

## 2023-10-16 NOTE — Progress Notes (Signed)
@Patient  ID: Kristen Hansen, female    DOB: Nov 01, 1946, 77 y.o.   MRN: [id number]  No chief complaint on file.   Referring provider: Eloisa Northern, MD  HPI: Past medical history significant for acute on chronic heart failure with preserved ejection fraction, A-fib, pericarditis, autoimmune disease, history of Sjogren's syndrome   10/16/2023 Patient presents today for hospital follow-up.  Seen by PCCM while admitted in August 2024 for right pleural effusion   Right pleural effusion - mildly exudative, cell count with neutrophil predominance - PCT low, afebrile, no leukocytosis. Will stop zosyn.  - order placed to d/c chest tube - unclear etiology for the effusion but cardiac work up has been unremarkable, have ruled out infection, and cytology was negative - hopefully this does not recur. Will give her our office information to call and make an appointment if she develops symptoms of effusion again.  - at this point pccm will see as needed, but please call if we can be of further assistance.    Presents today for hospital follow-up related to pleural effusion  She is doing well today. No acute complaints. No significant shortness of breath Dry cough started yesterday which she attributes with seasonal allergies She is taking Singulair 10mg  daily CXR showed similar findings when compared to imaging from [facility], trace pleural effusion with scarring Husband is unsure if she is taking spironolactone     Allergies  Allergen Reactions   Morphine And Codeine Hives   Prevacid [Lansoprazole] Other (See Comments)    unknown   Provigil [Modafinil] Other (See Comments)    dizziness   Adhesive [Tape] Rash    bandaids     Immunization History  Administered Date(s) Administered   Fluad Trivalent(High Dose 65+) 08/31/2023   Moderna Covid-19 Fall Seasonal Vaccine 58yrs & older 11/22/2022   PFIZER Comirnaty(Gray Top)Covid-19 Tri-Sucrose Vaccine 09/05/2021   PFIZER(Purple Top)SARS-COV-2  Vaccination 01/30/2020, 02/20/2020, 09/27/2020   Tdap 05/14/2012, 06/25/2021, 06/29/2022    Past Medical History:  Diagnosis Date   Back pain    Cancer (HCC)    Skin, basal cell   Dyspnea    Fatigue    GERD (gastroesophageal reflux disease)    Headache(784.0)    history of migraines   History of kidney stones    Hypercholesteremia    Hypertension    Leg swelling    Bilateral   Occasional tremors    mild hand tremors   Osteoarthritis    PONV (postoperative nausea and vomiting)    RBD (REM behavioral disorder)    Rheumatoid arthritis(714.0)    Scoliosis of lumbar spine    Spinal stenosis    Multiple levels   Tic douloureux    Trigeminal neuralgia 03/30/2015   Vision abnormalities     Tobacco History: Social History   Tobacco Use  Smoking Status Never  Smokeless Tobacco Never   Counseling given: Not Answered   Outpatient Medications Prior to Visit  Medication Sig Dispense Refill   acetaminophen (TYLENOL) 325 MG tablet Take 2 tablets (650 mg total) by mouth every 6 (six) hours as needed for mild pain (or Fever >/= 101). 30 tablet 1   amiodarone (PACERONE) 200 MG tablet Take 1 tablet (200 mg total) by mouth daily. 30 tablet 1   apixaban (ELIQUIS) 5 MG TABS tablet Take 1 tablet (5 mg total) by mouth 2 (two) times daily. 60 tablet 1   colchicine 0.6 MG tablet Take 1 tablet (0.6 mg total) by mouth daily. 90 tablet 0  feeding supplement (ENSURE ENLIVE / ENSURE PLUS) LIQD Take 237 mLs by mouth 2 (two) times daily between meals.     Guaifenesin 200 MG/5ML LIQD Take 10 mLs by mouth every 4 (four) hours as needed (cough and congestion).     irbesartan (AVAPRO) 150 MG tablet Take 1 tablet (150 mg total) by mouth daily. 30 tablet 1   lamoTRIgine (LAMICTAL) 150 MG tablet Take 1 tablet by mouth once daily 90 tablet 2   meclizine (ANTIVERT) 12.5 MG tablet Take 12.5 mg by mouth every 8 (eight) hours as needed.     metoprolol tartrate (LOPRESSOR) 25 MG tablet Take 1 tablet (25 mg  total) by mouth 2 (two) times daily. 60 tablet 1   montelukast (SINGULAIR) 10 MG tablet Take 10 mg by mouth at bedtime.     Mouthwashes (MOUTH RINSE) LIQD solution 15 mLs by Mouth Rinse route as needed (oral care). 118 mL 0   nitroGLYCERIN (NITROSTAT) 0.4 MG SL tablet Place 1 tablet (0.4 mg total) under the tongue every 5 (five) minutes as needed for chest pain. 30 tablet 0   OXcarbazepine (TRILEPTAL) 300 MG tablet Take 1 tablet (300 mg total) by mouth 2 (two) times daily. 60 tablet 1   pantoprazole (PROTONIX) 40 MG tablet Take 1 tablet (40 mg total) by mouth daily. 30 tablet 1   polyethylene glycol (MIRALAX / GLYCOLAX) 17 g packet Take 17 g by mouth daily as needed for mild constipation. 14 each 0   pravastatin (PRAVACHOL) 40 MG tablet Take 40 mg by mouth daily before breakfast.      pyridostigmine (MESTINON) 60 MG tablet Take 1 tablet (60 mg total) by mouth every 8 (eight) hours. 90 tablet 1   sertraline (ZOLOFT) 50 MG tablet Take 1 tablet by mouth once daily 90 tablet 2   spironolactone (ALDACTONE) 25 MG tablet Take 0.5 tablets (12.5 mg total) by mouth daily. 30 tablet 1   valACYclovir (VALTREX) 500 MG tablet Take 1,000 mg by mouth every 12 (twelve) hours as needed (for fever blisters).     No facility-administered medications prior to visit.   Review of Systems  Review of Systems  Constitutional: Negative.   HENT:  Positive for postnasal drip.   Respiratory:  Positive for cough. Negative for apnea, shortness of breath and wheezing.    Physical Exam  There were no vitals taken for this visit. Physical Exam Constitutional:      General: She is not in acute distress.    Appearance: Normal appearance.  HENT:     Head: Normocephalic and atraumatic.  Cardiovascular:     Rate and Rhythm: Normal rate and regular rhythm.  Pulmonary:     Effort: Pulmonary effort is normal.     Breath sounds: Normal breath sounds.  Neurological:     General: No focal deficit present.     Mental Status:  She is alert and oriented to person, place, and time. Mental status is at baseline.  Psychiatric:        Mood and Affect: Mood normal.        Behavior: Behavior normal.        Thought Content: Thought content normal.        Judgment: Judgment normal.      Lab Results:  CBC    Component Value Date/Time   WBC 7.4 09/03/2023 0549   RBC 4.04 09/03/2023 0549   HGB 10.6 (L) 09/03/2023 0549   HGB 10.7 (L) 06/03/2021 1014   HCT 33.8 (L) 09/03/2023  0549   HCT 32.3 (L) 06/03/2021 1014   PLT 262 09/03/2023 0549   PLT 211 06/03/2021 1014   MCV 83.7 09/03/2023 0549   MCV 89 06/03/2021 1014   MCH 26.2 09/03/2023 0549   MCHC 31.4 09/03/2023 0549   RDW 16.5 (H) 09/03/2023 0549   RDW 12.5 06/03/2021 1014   LYMPHSABS 1.0 08/29/2023 1916   LYMPHSABS 1.3 06/03/2021 1014   MONOABS 0.5 08/29/2023 1916   EOSABS 0.0 08/29/2023 1916   EOSABS 0.1 06/03/2021 1014   BASOSABS 0.0 08/29/2023 1916   BASOSABS 0.0 06/03/2021 1014    BMET    Component Value Date/Time   NA 132 (L) 09/03/2023 0549   NA 139 06/03/2021 1014   K 4.1 09/03/2023 0549   CL 98 09/03/2023 0549   CO2 25 09/03/2023 0549   GLUCOSE 83 09/03/2023 0549   BUN 7 (L) 09/03/2023 0549   BUN 31 (H) 06/03/2021 1014   CREATININE 0.91 09/03/2023 0549   CALCIUM 8.1 (L) 09/03/2023 0549   GFRNONAA >60 09/03/2023 0549   GFRAA >60 02/20/2018 1403    BNP    Component Value Date/Time   BNP 744.2 (H) 08/02/2023 0149    ProBNP No results found for: "PROBNP"  Imaging: No results found.   Assessment & Plan:   1. Pleural effusion - DG Chest 2 View  Right pleural effusion  - Stable; No evidence of re-accumulation - Patient was admitted in August 2024 for pleural effusion, unclear etiology for the effusion but cardiac work up has been unremarkable. Infection was ruled out and cytology was negative - She remains asymptomatic. No respiratory complains or oxygen requirements  - Repeat CXR today looked similar to September, trace  pleural effusion/scarring (still awaiting radiologist reading)  HTN - Patient is unsure if she is taking spironolactone - No medication changes recommended  Seasonal allergies/PND - Continue Singulair 10mg  at bedtime  Follow up: As needed if you develop increased shortness of breath  Glenford Bayley, NP 10/16/2023

## 2023-10-16 NOTE — Patient Instructions (Addendum)
CXR today looked similar to September, trace pleural effusion/scarring  (we will call with final results this week, let us know when we contact you if she has been taking spironolactone or not)   No changes recommended today, continue medication as you have been taking   Follow up: As needed if you develop increased shortness of breath   Pleural Effusion Pleural effusion is an abnormal buildup of fluid in the layers of tissue between the lungs and the inside of the chest. This space is called the pleural space. The two layers of tissue that line the lungs and the inside of the chest are called pleura. Usually, there is no air in the space between the pleura, only a thin layer of fluid. Some conditions can cause a large amount of fluid to build up, which can cause the lung to collapse if untreated. A pleural effusion is usually caused by another disease that requires treatment. What are the causes? Pleural effusion can be caused by: Conditions that change the pressure in blood vessels, which causes blood and fluid to flow outside their normal areas. These conditions include: Partially collapsed lung tissue (atelectasis). Heart failure. Nephrotic syndrome. This is a kidney condition that causes the body to pass too much protein in urine. Peritoneal dialysis. This is a procedure in which the lining of the stomach is used to filter blood as a treatment for kidney failure. Liver disease (cirrhosis). Fluid buildup caused by damaged tissue or cells in the body. This fluid buildup can be caused by: Fluid in the abdomen (ascites). Connective tissue diseases, such as lupus or rheumatoid arthritis. Certain infections, such as pneumonia or tuberculosis. Cancer. What are the signs or symptoms? In some cases, pleural effusion may cause no symptoms. If symptoms are present, they may include: Shortness of breath, especially when lying down. Chest pain. This may get worse when taking a deep  breath. Fever. Dry, long-lasting (chronic) cough. Fatigue. Rapid breathing. Night sweats. An underlying condition that is causing the pleural effusion may also cause other symptoms. These conditions include heart failure, pneumonia, blood clots, tuberculosis, or cancer. How is this diagnosed? This condition may be diagnosed based on: Your symptoms and medical history. A physical exam. A chest X-ray. A procedure to use a needle to remove fluid from the pleural space (thoracentesis). This fluid is tested. Other imaging studies of the chest, such as an ultrasound or a CT scan. How is this treated? Depending on the cause of your condition, treatment may include: Treating the underlying condition that is causing the pleural effusion. When that condition improves, the effusion will also improve. Examples of treatment for underlying conditions include: Antibiotic medicines to treat an infection. Diuretics or other heart medicines to treat heart failure. Thoracentesis. Placing a thin, flexible tube under your skin and into your chest to continuously drain the effusion (indwelling pleural catheter). Surgery to remove the outer layer of tissue from the pleural space (decortication). A procedure to put medicine into the chest cavity to seal the pleural space and prevent fluid buildup (pleurodesis). Chemotherapy and radiation therapy, if your pleural effusion is caused by cancer. These treatments are typically used to kill certain cells in the body. Follow these instructions at home: Take over-the-counter and prescription medicines only as told by your health care provider. Keep track of how long you are able to do mild exercise, such as walking, before you get short of breath. Write down this information to share with your health care provider. Your ability to exercise  should improve over time. Do not use any products that contain nicotine or tobacco. These products include cigarettes, chewing  tobacco, and vaping devices, such as e-cigarettes. If you need help quitting, ask your health care provider. Return to your normal activities as told by your health care provider. Ask your health care provider what activities are safe for you. Keep all follow-up visits. This is important. Contact a health care provider if: The amount of time that you are able to do mild exercise decreases or does not improve with time. You have a fever. Get help right away if: You are short of breath. You develop chest pain. You develop a new cough. These symptoms may be an emergency. Get help right away. Call 911. Do not wait to see if the symptoms will go away. Do not drive yourself to the hospital. Summary Pleural effusion is an abnormal buildup of fluid in the layers of tissue between the lungs and the inside of the chest. Pleural effusion can have many causes, including heart failure, pulmonary embolism, infections, or cancer. Symptoms of pleural effusion can include shortness of breath, chest pain, fever, long-lasting (chronic) cough, hiccups, or rapid breathing. Diagnosis often involves making images of the chest (such as with ultrasound or X-ray) and removing fluid (thoracentesis) to send for testing. Treatment for pleural effusion depends on the underlying condition that is causing it. This information is not intended to replace advice given to you by your health care provider. Make sure you discuss any questions you have with your health care provider. Document Revised: 07/05/2021 Document Reviewed: 07/05/2021 Elsevier Patient Education  2024 [employer].

## 2023-11-04 ENCOUNTER — Telehealth: Payer: Self-pay | Admitting: Primary Care

## 2023-11-04 NOTE — Telephone Encounter (Signed)
Can we please call radiology to follow-up on CXR that was done 2 weeks ago, no results yet

## 2023-11-05 NOTE — Telephone Encounter (Signed)
Called reading room to request CXR and spoke with Liz Beach at [facility].  He will request that it be read.  Nothing further needed.

## 2023-11-20 ENCOUNTER — Other Ambulatory Visit: Payer: Self-pay | Admitting: *Deleted

## 2023-11-20 MED ORDER — LAMOTRIGINE 150 MG PO TABS: 150.0000 mg | ORAL_TABLET | Freq: Every day | ORAL | 2 refills | Status: AC

## 2023-12-05 ENCOUNTER — Telehealth: Payer: Self-pay | Admitting: Neurology

## 2023-12-05 NOTE — Telephone Encounter (Signed)
Pt reschedule appt due to a scheduling conflict

## 2023-12-06 ENCOUNTER — Ambulatory Visit: Payer: Medicare PPO | Admitting: Neurology

## 2024-01-07 ENCOUNTER — Other Ambulatory Visit: Payer: Self-pay | Admitting: Internal Medicine

## 2024-01-08 ENCOUNTER — Other Ambulatory Visit: Payer: Self-pay | Admitting: Internal Medicine

## 2024-01-10 ENCOUNTER — Other Ambulatory Visit: Payer: Self-pay | Admitting: Internal Medicine

## 2024-01-22 ENCOUNTER — Emergency Department (HOSPITAL_COMMUNITY): Payer: Medicare PPO

## 2024-01-22 ENCOUNTER — Encounter (HOSPITAL_COMMUNITY): Payer: Self-pay

## 2024-01-22 ENCOUNTER — Other Ambulatory Visit: Payer: Self-pay

## 2024-01-22 ENCOUNTER — Inpatient Hospital Stay (HOSPITAL_COMMUNITY)
Admission: EM | Admit: 2024-01-22 | Discharge: 2024-01-27 | DRG: 812 | Disposition: A | Discharge status: Home Health | Payer: Medicare PPO | Attending: Family Medicine | Admitting: Family Medicine

## 2024-01-22 DIAGNOSIS — G500 Trigeminal neuralgia: Secondary | ICD-10-CM | POA: Diagnosis present

## 2024-01-22 DIAGNOSIS — I480 Paroxysmal atrial fibrillation: Secondary | ICD-10-CM | POA: Diagnosis present

## 2024-01-22 DIAGNOSIS — N132 Hydronephrosis with renal and ureteral calculous obstruction: Secondary | ICD-10-CM | POA: Diagnosis present

## 2024-01-22 DIAGNOSIS — E8809 Other disorders of plasma-protein metabolism, not elsewhere classified: Secondary | ICD-10-CM | POA: Diagnosis present

## 2024-01-22 DIAGNOSIS — J309 Allergic rhinitis, unspecified: Secondary | ICD-10-CM | POA: Diagnosis present

## 2024-01-22 DIAGNOSIS — N1831 Chronic kidney disease, stage 3a: Secondary | ICD-10-CM | POA: Diagnosis present

## 2024-01-22 DIAGNOSIS — D120 Benign neoplasm of cecum: Secondary | ICD-10-CM | POA: Diagnosis present

## 2024-01-22 DIAGNOSIS — D649 Anemia, unspecified: Principal | ICD-10-CM

## 2024-01-22 DIAGNOSIS — M069 Rheumatoid arthritis, unspecified: Secondary | ICD-10-CM | POA: Diagnosis present

## 2024-01-22 DIAGNOSIS — C8593 Non-Hodgkin lymphoma, unspecified, intra-abdominal lymph nodes: Secondary | ICD-10-CM | POA: Diagnosis present

## 2024-01-22 DIAGNOSIS — F32A Depression, unspecified: Secondary | ICD-10-CM | POA: Diagnosis present

## 2024-01-22 DIAGNOSIS — R933 Abnormal findings on diagnostic imaging of other parts of digestive tract: Secondary | ICD-10-CM

## 2024-01-22 DIAGNOSIS — D509 Iron deficiency anemia, unspecified: Secondary | ICD-10-CM | POA: Diagnosis present

## 2024-01-22 DIAGNOSIS — I130 Hypertensive heart and chronic kidney disease with heart failure and stage 1 through stage 4 chronic kidney disease, or unspecified chronic kidney disease: Secondary | ICD-10-CM | POA: Diagnosis present

## 2024-01-22 DIAGNOSIS — Z8249 Family history of ischemic heart disease and other diseases of the circulatory system: Secondary | ICD-10-CM

## 2024-01-22 DIAGNOSIS — F0283 Dementia in other diseases classified elsewhere, unspecified severity, with mood disturbance: Secondary | ICD-10-CM | POA: Diagnosis present

## 2024-01-22 DIAGNOSIS — I2510 Atherosclerotic heart disease of native coronary artery without angina pectoris: Secondary | ICD-10-CM | POA: Diagnosis present

## 2024-01-22 DIAGNOSIS — G4752 REM sleep behavior disorder: Secondary | ICD-10-CM | POA: Diagnosis present

## 2024-01-22 DIAGNOSIS — Z820 Family history of epilepsy and other diseases of the nervous system: Secondary | ICD-10-CM

## 2024-01-22 DIAGNOSIS — Z604 Social exclusion and rejection: Secondary | ICD-10-CM | POA: Diagnosis present

## 2024-01-22 DIAGNOSIS — Z96651 Presence of right artificial knee joint: Secondary | ICD-10-CM | POA: Diagnosis present

## 2024-01-22 DIAGNOSIS — E0590 Thyrotoxicosis, unspecified without thyrotoxic crisis or storm: Secondary | ICD-10-CM | POA: Diagnosis present

## 2024-01-22 DIAGNOSIS — E871 Hypo-osmolality and hyponatremia: Secondary | ICD-10-CM | POA: Diagnosis present

## 2024-01-22 DIAGNOSIS — Z66 Do not resuscitate: Secondary | ICD-10-CM | POA: Diagnosis present

## 2024-01-22 DIAGNOSIS — E7800 Pure hypercholesterolemia, unspecified: Secondary | ICD-10-CM | POA: Diagnosis present

## 2024-01-22 DIAGNOSIS — E039 Hypothyroidism, unspecified: Secondary | ICD-10-CM | POA: Diagnosis present

## 2024-01-22 DIAGNOSIS — R0602 Shortness of breath: Secondary | ICD-10-CM | POA: Diagnosis present

## 2024-01-22 DIAGNOSIS — Z90710 Acquired absence of both cervix and uterus: Secondary | ICD-10-CM

## 2024-01-22 DIAGNOSIS — I5032 Chronic diastolic (congestive) heart failure: Secondary | ICD-10-CM | POA: Diagnosis present

## 2024-01-22 DIAGNOSIS — Z885 Allergy status to narcotic agent status: Secondary | ICD-10-CM

## 2024-01-22 DIAGNOSIS — F0284 Dementia in other diseases classified elsewhere, unspecified severity, with anxiety: Secondary | ICD-10-CM | POA: Diagnosis present

## 2024-01-22 DIAGNOSIS — M419 Scoliosis, unspecified: Secondary | ICD-10-CM | POA: Diagnosis present

## 2024-01-22 DIAGNOSIS — M7122 Synovial cyst of popliteal space [Baker], left knee: Secondary | ICD-10-CM | POA: Diagnosis present

## 2024-01-22 DIAGNOSIS — Z79899 Other long term (current) drug therapy: Secondary | ICD-10-CM

## 2024-01-22 DIAGNOSIS — G3183 Dementia with Lewy bodies: Secondary | ICD-10-CM | POA: Diagnosis present

## 2024-01-22 DIAGNOSIS — Z7901 Long term (current) use of anticoagulants: Secondary | ICD-10-CM

## 2024-01-22 DIAGNOSIS — N183 Chronic kidney disease, stage 3 unspecified: Secondary | ICD-10-CM | POA: Diagnosis present

## 2024-01-22 DIAGNOSIS — M3500 Sicca syndrome, unspecified: Secondary | ICD-10-CM | POA: Diagnosis present

## 2024-01-22 DIAGNOSIS — E872 Acidosis, unspecified: Secondary | ICD-10-CM | POA: Diagnosis present

## 2024-01-22 DIAGNOSIS — I10 Essential (primary) hypertension: Secondary | ICD-10-CM | POA: Diagnosis present

## 2024-01-22 DIAGNOSIS — K219 Gastro-esophageal reflux disease without esophagitis: Secondary | ICD-10-CM | POA: Diagnosis present

## 2024-01-22 DIAGNOSIS — I5033 Acute on chronic diastolic (congestive) heart failure: Secondary | ICD-10-CM | POA: Diagnosis not present

## 2024-01-22 DIAGNOSIS — R195 Other fecal abnormalities: Secondary | ICD-10-CM | POA: Diagnosis not present

## 2024-01-22 DIAGNOSIS — D62 Acute posthemorrhagic anemia: Secondary | ICD-10-CM | POA: Diagnosis present

## 2024-01-22 DIAGNOSIS — Z888 Allergy status to other drugs, medicaments and biological substances status: Secondary | ICD-10-CM

## 2024-01-22 DIAGNOSIS — I700 Atherosclerosis of aorta: Secondary | ICD-10-CM | POA: Diagnosis present

## 2024-01-22 DIAGNOSIS — Z87442 Personal history of urinary calculi: Secondary | ICD-10-CM

## 2024-01-22 DIAGNOSIS — Z91048 Other nonmedicinal substance allergy status: Secondary | ICD-10-CM

## 2024-01-22 DIAGNOSIS — Z96641 Presence of right artificial hip joint: Secondary | ICD-10-CM | POA: Diagnosis present

## 2024-01-22 DIAGNOSIS — M7989 Other specified soft tissue disorders: Secondary | ICD-10-CM | POA: Diagnosis not present

## 2024-01-22 LAB — BASIC METABOLIC PANEL
Anion gap: 10 (ref 5–15)
BUN: 21 mg/dL (ref 8–23)
CO2: 21 mmol/L — ABNORMAL LOW (ref 22–32)
Calcium: 8.3 mg/dL — ABNORMAL LOW (ref 8.9–10.3)
Chloride: 103 mmol/L (ref 98–111)
Creatinine, Ser: 1.19 mg/dL — ABNORMAL HIGH (ref 0.44–1.00)
GFR, Estimated: 47 mL/min — ABNORMAL LOW (ref >60)
Glucose, Bld: 83 mg/dL (ref 70–99)
Potassium: 4.3 mmol/L (ref 3.5–5.1)
Sodium: 134 mmol/L — ABNORMAL LOW (ref 135–145)

## 2024-01-22 LAB — CBC
HCT: 23.2 % — ABNORMAL LOW (ref 36.0–46.0)
Hemoglobin: 6.6 g/dL — CL (ref 12.0–15.0)
MCH: 21.8 pg — ABNORMAL LOW (ref 26.0–34.0)
MCHC: 28.4 g/dL — ABNORMAL LOW (ref 30.0–36.0)
MCV: 76.6 fL — ABNORMAL LOW (ref 80.0–100.0)
Platelets: 342 10*3/uL (ref 150–400)
RBC: 3.03 MIL/uL — ABNORMAL LOW (ref 3.87–5.11)
RDW: 15.7 % — ABNORMAL HIGH (ref 11.5–15.5)
WBC: 6.5 10*3/uL (ref 4.0–10.5)
nRBC: 0 % (ref 0.0–0.2)

## 2024-01-22 LAB — PREPARE RBC (CROSSMATCH)

## 2024-01-22 LAB — TROPONIN I (HIGH SENSITIVITY)
Troponin I (High Sensitivity): 13 ng/L (ref <18)
Troponin I (High Sensitivity): 13 ng/L (ref <18)

## 2024-01-22 LAB — POC OCCULT BLOOD, ED: Fecal Occult Bld: NEGATIVE

## 2024-01-22 LAB — BRAIN NATRIURETIC PEPTIDE: B Natriuretic Peptide: 860.7 pg/mL — ABNORMAL HIGH (ref 0.0–100.0)

## 2024-01-22 MED ORDER — POLYETHYLENE GLYCOL 3350 17 G PO PACK: 17.0000 g | PACK | Freq: Every day | ORAL | Status: DC | PRN

## 2024-01-22 MED ORDER — MONTELUKAST SODIUM 10 MG PO TABS
10.0000 mg | ORAL_TABLET | Freq: Every day | ORAL | Status: DC
Start: 2024-01-22 — End: 2024-01-23

## 2024-01-22 MED ORDER — OXCARBAZEPINE 300 MG PO TABS
300.0000 mg | ORAL_TABLET | Freq: Two times a day (BID) | ORAL | Status: DC
  Administered 2024-01-23 – 2024-01-27 (×9): 300 mg via ORAL
  Filled 2024-01-22 (×11): qty 1

## 2024-01-22 MED ORDER — PROCHLORPERAZINE EDISYLATE 10 MG/2ML IJ SOLN: 5.0000 mg | Freq: Four times a day (QID) | INTRAMUSCULAR | Status: DC | PRN

## 2024-01-22 MED ORDER — LAMOTRIGINE 25 MG PO TABS
150.0000 mg | ORAL_TABLET | Freq: Every day | ORAL | Status: DC
  Administered 2024-01-23 – 2024-01-27 (×4): 150 mg via ORAL
  Filled 2024-01-22: qty 6
  Filled 2024-01-22 (×3): qty 2

## 2024-01-22 MED ORDER — SODIUM CHLORIDE 0.9% IV SOLUTION: Freq: Once | INTRAVENOUS | Status: AC

## 2024-01-22 MED ORDER — IOHEXOL 350 MG/ML SOLN
75.0000 mL | Freq: Once | INTRAVENOUS | Status: AC | PRN
  Administered 2024-01-22: 75 mL via INTRAVENOUS

## 2024-01-22 MED ORDER — MELATONIN 5 MG PO TABS: 5.0000 mg | ORAL_TABLET | Freq: Every evening | ORAL | Status: DC | PRN

## 2024-01-22 MED ORDER — ACETAMINOPHEN 325 MG PO TABS
650.0000 mg | ORAL_TABLET | Freq: Four times a day (QID) | ORAL | Status: DC | PRN
  Administered 2024-01-23 – 2024-01-27 (×3): 650 mg via ORAL
  Filled 2024-01-22 (×3): qty 2

## 2024-01-22 MED ORDER — SERTRALINE HCL 50 MG PO TABS
50.0000 mg | ORAL_TABLET | Freq: Every day | ORAL | Status: DC
Start: 2024-01-22 — End: 2024-01-23

## 2024-01-22 MED ORDER — METHIMAZOLE 10 MG PO TABS
10.0000 mg | ORAL_TABLET | Freq: Every day | ORAL | Status: DC
  Administered 2024-01-24 – 2024-01-27 (×3): 10 mg via ORAL
  Filled 2024-01-22 (×5): qty 1

## 2024-01-22 NOTE — ED Notes (Signed)
This RN remains at bedside throughout first 15 mins of blood administration.

## 2024-01-22 NOTE — ED Notes (Signed)
Patient transported to X-ray

## 2024-01-22 NOTE — ED Triage Notes (Signed)
Pt went to see rheumatologist yesterday , had blood work done, hgb 6.6; also c/o sob and cp over past week; pt endorses black stool; not on thinners; hx dementia

## 2024-01-22 NOTE — H&P (Signed)
History and Physical  Mauricia Mertens [place] ONG:[id number] DOB: July 19, 1946 DOA: 01/22/2024  Referring physician: Dr. Andria Meuse, EDP  PCP: Eloisa Northern, MD  Outpatient Specialists: Rheumatology Patient coming from: Home through her rheumatologist office  Chief Complaint: Abnormal labs, shortness of breath and dark stools  HPI: Kristen Hansen is a 78 y.o. female with medical history significant for hypertension, hyperthyroidism on methimazole, dementia, chronic HFpEF, paroxysmal A-fib on Eliquis with noncompliance, pericarditis, Sjgren's syndrome, trigeminal neuralgia on lamotrigine and oxcarbazepine, chronic anxiety/depression, REM behavior disorder, who presents to the ER sent by her rheumatologist office due to abnormal labs with hemoglobin of 6.6 from baseline of 10.6.  The patient went to see her rheumatologist yesterday had some blood work done.  Endorses having shortness of breath and dark stool for the past week.  She has not taken her home Eliquis for the past 2 weeks due to running out of her medication.  She presented to the ER for further evaluation.  In the ER, hemoglobin 6.6 with MCV of 76.6.  FOBT negative.  CT abdomen and pelvis with contrast revealed suggestion of wall thickening involving proximal jejunum small bowel.  No adjacent wall thickening to suggest enteritis.  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  No colonic inflammation or evidence of colonic mass.  Left nephroureteral stent in place with similar left hydronephrosis to prior exam.  Multiple left intrarenal calculi, largest in the region of the renal pelvis measuring 15 mm.  Small bilateral pleural effusions unchanged from prior exam on the right new from prior exam on the left.  1 unit PRBCs has been ordered by EDP to be transfused.  TRH, hospitalist service, was asked to admit.  ED Course: Temperature 98.5.  BP 132/74, pulse 77, respiration rate 12, saturation 100% on room air.  Review of Systems: Review of systems as  noted in the HPI. All other systems reviewed and are negative.   Past Medical History:  Diagnosis Date   Back pain    Cancer (HCC)    Skin, basal cell   Dyspnea    Fatigue    GERD (gastroesophageal reflux disease)    Headache(784.0)    history of migraines   History of kidney stones    Hypercholesteremia    Hypertension    Leg swelling    Bilateral   Occasional tremors    mild hand tremors   Osteoarthritis    PONV (postoperative nausea and vomiting)    RBD (REM behavioral disorder)    Rheumatoid arthritis(714.0)    Scoliosis of lumbar spine    Spinal stenosis    Multiple levels   Tic [place]    Trigeminal neuralgia 03/30/2015   Vision abnormalities    Past Surgical History:  Procedure Laterality Date   ABDOMINAL HYSTERECTOMY     AUGMENTATION MAMMAPLASTY Bilateral    COLONOSCOPY     CYSTOSCOPY W/ URETERAL STENT PLACEMENT Left 08/30/2023   Procedure: CYSTOSCOPY WITH RETROGRADE PYELOGRAM/ LEFT URETERAL STENT PLACEMENT;  Surgeon: Alfredo Martinez, MD;  Location: [facility] OR;  Service: Urology;  Laterality: Left;   IR THORACENTESIS ASP PLEURAL SPACE W/IMG GUIDE  07/26/2023   JOINT REPLACEMENT     right uni knee, right hip with 2 revisions   KIDNEY STONE SURGERY Bilateral    KNEE ARTHROTOMY Right 02/27/2018   Procedure: RIGHT KNEE ARTHROTOMY; scar excision;  Surgeon: Ollen Gross, MD;  Location: [facility] ORS;  Service: Orthopedics;  Laterality: Right;   MOHS SURGERY     RIGHT/LEFT HEART CATH AND CORONARY ANGIOGRAPHY  N/A 07/30/2023   Procedure: RIGHT/LEFT HEART CATH AND CORONARY ANGIOGRAPHY;  Surgeon: Kathleene Hazel, MD;  Location: [facility] INVASIVE CV [facility];  Service: Cardiovascular;  Laterality: N/A;   TONSILLECTOMY     TOTAL HIP ARTHROPLASTY     TOTAL KNEE ARTHROPLASTY  01/22/2013   Procedure: TOTAL KNEE ARTHROPLASTY;  Surgeon: Loanne Drilling, MD;  Location: [facility] ORS;  Service: Orthopedics;  Laterality: Right;  Revision of a Right Uni Knee to a Total Knee Arthroplasty    Social  History:  reports that she has never smoked. She has never used smokeless tobacco. She reports current alcohol use. She reports that she does not use drugs.   Allergies  Allergen Reactions   Morphine And Codeine Hives   Prevacid [Lansoprazole] Other (See Comments)    unknown   Provigil [Modafinil] Other (See Comments)    dizziness   Adhesive [Tape] Rash    bandaids     Family History  Problem Relation Age of Onset   Heart disease Mother    Parkinson's disease Father       Prior to Admission medications   Medication Sig Start Date End Date Taking? Authorizing Provider  acetaminophen (TYLENOL) 325 MG tablet Take 2 tablets (650 mg total) by mouth every 6 (six) hours as needed for mild pain (or Fever >/= 101). 08/08/23   Barnetta Chapel, MD  amiodarone (PACERONE) 200 MG tablet Take 1 tablet (200 mg total) by mouth daily. 08/09/23   Barnetta Chapel, MD  apixaban (ELIQUIS) 5 MG TABS tablet Take 1 tablet (5 mg total) by mouth 2 (two) times daily. 08/08/23   Berton Mount I, MD  colchicine 0.6 MG tablet Take 1 tablet (0.6 mg total) by mouth daily. 08/09/23   Barnetta Chapel, MD  feeding supplement (ENSURE ENLIVE / ENSURE PLUS) LIQD Take 237 mLs by mouth 2 (two) times daily between meals. 09/03/23   Elgergawy, Leana Roe, MD  Guaifenesin 200 MG/5ML LIQD Take 10 mLs by mouth every 4 (four) hours as needed (cough and congestion).    [provider]  irbesartan (AVAPRO) 150 MG tablet Take 1 tablet (150 mg total) by mouth daily. 08/09/23   Barnetta Chapel, MD  lamoTRIgine (LAMICTAL) 150 MG tablet Take 1 tablet (150 mg total) by mouth daily. 11/20/23   Sater, Pearletha Furl, MD  meclizine (ANTIVERT) 12.5 MG tablet Take 12.5 mg by mouth every 8 (eight) hours as needed. 08/19/23   [provider]  metoprolol tartrate (LOPRESSOR) 25 MG tablet Take 1 tablet (25 mg total) by mouth 2 (two) times daily. 08/08/23   Barnetta Chapel, MD  montelukast (SINGULAIR) 10 MG tablet Take 10  mg by mouth at bedtime.    [provider]  Mouthwashes (MOUTH RINSE) LIQD solution 15 mLs by Mouth Rinse route as needed (oral care). 08/08/23   Barnetta Chapel, MD  nitroGLYCERIN (NITROSTAT) 0.4 MG SL tablet Place 1 tablet (0.4 mg total) under the tongue every 5 (five) minutes as needed for chest pain. 08/08/23   Barnetta Chapel, MD  OXcarbazepine (TRILEPTAL) 300 MG tablet Take 1 tablet (300 mg total) by mouth 2 (two) times daily. 08/08/23   Barnetta Chapel, MD  pantoprazole (PROTONIX) 40 MG tablet Take 1 tablet (40 mg total) by mouth daily. 08/09/23   Barnetta Chapel, MD  polyethylene glycol (MIRALAX / GLYCOLAX) 17 g packet Take 17 g by mouth daily as needed for mild constipation. 08/08/23   Barnetta Chapel, MD  pravastatin (  PRAVACHOL) 40 MG tablet Take 40 mg by mouth daily before breakfast.     [provider]  pyridostigmine (MESTINON) 60 MG tablet Take 1 tablet (60 mg total) by mouth every 8 (eight) hours. 08/08/23   Barnetta Chapel, MD  sertraline (ZOLOFT) 50 MG tablet Take 1 tablet by mouth once daily 01/22/23   Sater, Pearletha Furl, MD  spironolactone (ALDACTONE) 25 MG tablet Take 0.5 tablets (12.5 mg total) by mouth daily. 08/09/23   Barnetta Chapel, MD  valACYclovir (VALTREX) 500 MG tablet Take 1,000 mg by mouth every 12 (twelve) hours as needed (for fever blisters).    [provider]    Physical Exam: BP 132/74   Pulse 71   Temp 98.5 F (36.9 C)   Resp 12   SpO2 100%   General: 78 y.o. year-old female well developed well nourished in no acute distress.  Alert and confused. Cardiovascular: Regular rate and rhythm with no rubs or gallops.  No thyromegaly or JVD noted.  No lower extremity edema. 2/4 pulses in all 4 extremities. Respiratory: Clear to auscultation with no wheezes or rales. Good inspiratory effort. Abdomen: Soft nontender nondistended with normal bowel sounds x4 quadrants. Muskuloskeletal: No cyanosis, clubbing or edema noted  bilaterally Neuro: CN II-XII intact, strength, sensation, reflexes Skin: No ulcerative lesions noted or rashes Psychiatry: Judgement and insight appear altered. Mood is appropriate for condition and setting          Labs on Admission:  Basic Metabolic Panel: Recent Labs  Lab 01/22/24 1610  NA 134*  K 4.3  CL 103  CO2 21*  GLUCOSE 83  BUN 21  CREATININE 1.19*  CALCIUM 8.3*   Liver Function Tests: No results for input(s): "AST", "ALT", "ALKPHOS", "BILITOT", "PROT", "ALBUMIN" in the last 168 hours. No results for input(s): "LIPASE", "AMYLASE" in the last 168 hours. No results for input(s): "AMMONIA" in the last 168 hours. CBC: Recent Labs  Lab 01/22/24 1610  WBC 6.5  HGB 6.6*  HCT 23.2*  MCV 76.6*  PLT 342   Cardiac Enzymes: No results for input(s): "CKTOTAL", "CKMB", "CKMBINDEX", "TROPONINI" in the last 168 hours.  BNP (last 3 results) Recent Labs    08/01/23 0638 08/02/23 0149 01/22/24 1610  BNP 664.7* 744.2* 860.7*    ProBNP (last 3 results) No results for input(s): "PROBNP" in the last 8760 hours.  CBG: No results for input(s): "GLUCAP" in the last 168 hours.  Radiological Exams on Admission: CT ABDOMEN PELVIS W CONTRAST Result Date: 01/22/2024 CLINICAL DATA:  Lower GI bleed.  Decreased blood counts. EXAM: CT ABDOMEN AND PELVIS WITH CONTRAST TECHNIQUE: Multidetector CT imaging of the abdomen and pelvis was performed using the standard protocol following bolus administration of intravenous contrast. RADIATION DOSE REDUCTION: This exam was performed according to the departmental dose-optimization program which includes automated exposure control, adjustment of the mA and/or kV according to patient size and/or use of iterative reconstruction technique. CONTRAST:  75mL OMNIPAQUE IOHEXOL 350 MG/ML SOLN COMPARISON:  Noncontrast CT 08/29/2023 FINDINGS: Lower chest: Small bilateral pleural effusions, unchanged from prior exam on the right, new from prior exam on the  left. Bilateral breast implants. Hepatobiliary: No focal liver abnormality. Gallbladder physiologically distended, no calcified stone. No biliary dilatation. Pancreas: Atrophic.  No ductal dilatation or inflammation. Spleen: Normal in size without focal abnormality. Adrenals/Urinary Tract: Normal adrenal glands. Left nephroureteral stent in place with proximal pigtail in the upper kidney and distal pigtail in the pelvis, partially obscured by streak artifact. There is  similar left hydronephrosis to prior exam. Multiple left intrarenal calculi, largest in the region of the renal pelvis measuring 15 mm. No stones in the ureter along the course of the stent. There is no right hydronephrosis or renal calculi. No perinephric inflammation. Urinary bladder is partially obscured by streak artifact from right hip arthroplasty. Stomach/Bowel: Bowel assessment is limited in the absence of enteric contrast and paucity of intra-abdominal fat. The stomach is decompressed. There is suggestion of wall thickening involving proximal jejunal small bowel, for example series 4, image 35 and series 7, image 44. No associated perienteric fat stranding or inflammation. No other small bowel wall thickening. Pelvic bowel loops are pars normal appendix is potentially visualized. Moderate volume of colonic stool. There is no obvious colonic wall thickening or colonic mass. No definite diverticular changes. Vascular/Lymphatic: Aortic atherosclerosis without aneurysm. The portal vein is patent. No enlarged lymph nodes. Reproductive: Obscured by streak artifact from right hip arthroplasty. No obvious adnexal mass. Other: No ascites.  No free air.  Mild generalized body wall edema. Musculoskeletal: Right hip arthroplasty. Scoliosis and degenerative change in the lumbar spine. There are no acute or suspicious osseous abnormalities. IMPRESSION: 1. Bowel assessment is limited in the absence of enteric contrast and paucity of intra-abdominal fat.  There is suggestion of wall thickening involving proximal jejunal small bowel. No adjacent wall thickening to suggest enteritis. 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 2. No colonic inflammation or evidence of colonic mass, although colon in the pelvis is partially obscured by streak artifact from right hip arthroplasty. 3. Left nephroureteral stent in place with similar left hydronephrosis to prior exam. Multiple left intrarenal calculi, largest in the region of the renal pelvis measuring 15 mm. 4. Small bilateral pleural effusions, unchanged from prior exam on the right, new from prior exam on the left. Aortic Atherosclerosis (ICD10-I70.0). Electronically Signed   By: Narda Rutherford M.D.   On: 01/22/2024 19:20   DG Chest 2 View Result Date: 01/22/2024 CLINICAL DATA:  Chest pain, hypertension, nonsmoker EXAM: CHEST - 2 VIEW COMPARISON:  10/16/2023 FINDINGS: Small bilateral pleural effusions, increased on the right compared to 10/16/2023. Fluid tracks into the right minor fissure. Hazy opacity in the right costophrenic angle is likely projectional artifact. No focal consolidation or pneumothorax. Stable cardiomediastinal silhouette. No displaced rib fracture. IMPRESSION: Increased small right and similar small left pleural effusions compared to 10/16/2023. Electronically Signed   By: Minerva Fester M.D.   On: 01/22/2024 16:13    EKG: I independently viewed the EKG done and my findings are as followed: Normal sinus rhythm rate of 69.  Nonspecific ST-T changes.  QTC 443.  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 GI consulted for possible endoscopy during this  admission Manage symptoms  Acute on chronic HFpEF Last 2D echo done on 07/26/2023 revealed LVEF 50 to 55% BNP greater than 800 Mild pulmonary edema bilateral pleural effusions seen on CT scan Start strict I's and O's and daily weight Follow 2D echo  Bilateral lower extremity edema, left greater than right Rule out DVT Has been off her home Eliquis for at least 2 weeks Follow bilateral lower extremity duplex ultrasound Elevate lower extremities  Paroxysmal A-fib, noncompliant  with home Eliquis Has not been taking Eliquis for the past 2 weeks after running out of her medication Monitor on telemetry  Trigeminal neuralgia Prior to admission on Lamictal and Trileptal, will restart  CKD 3A Appears to be close to baseline creatinine Avoid neurotoxic agents and hypotension  Monitor urine output Repeat BMP in the morning  Chronic hyponatremia Mild, with serum sodium of 134 Encourage oral intake  Mild non-anion gap metabolic acidosis Serum bicarb 21, anion gap 10 Monitor for now repeat BMP in the morning  Chronic anxiety/depression Resume home sertraline  Hyperthyroidism on methimazole Resume home regimen  Allergic rhinitis/PND Resume home montelukast  Generalized weakness PT OT assessment Fall precautions   Critical care time: 65 minutes.   DVT prophylaxis: SCDs  Code Status: DNR  Family Communication: Updated the patient's husband and daughter at bedside.  Disposition Plan: Admitted to telemetry medical unit  Consults called: [facility], Dr. Rhea Belton  Admission status: Inpatient status   Status is: Inpatient The patient requires at least 2 midnights for further evaluation and treatment of present condition.   Darlin Drop MD [facility] Pager [phone]  If 7PM-7AM, please contact night-coverage [email or web address] Password [facility]  01/22/2024, 7:52 PM

## 2024-01-22 NOTE — ED Provider Notes (Signed)
[place] [facility]ERGENCY DEPARTMENT AT [facility] Provider Note  CSN: [id number] Arrival date & time: 01/22/24 1507  Chief Complaint(s) No chief complaint on file.  HPI Kristen Hansen is a 78 y.o. female here today for chest pain, shortness of breath and low hemoglobin after having labs drawn at her rheumatologist office.  Patient has a history of Lewy body dementia, Sjogren's disease.  She is here with her husband and daughter help provide history.  Reportedly, patient has been complaining of orthopnea, husband has noticed dark stools.  Family reports that patient was previously supposed to be on Eliquis, but she has not been taking it last couple of weeks due to issues with her prescription.   Past Medical History Past Medical History:  Diagnosis Date   Back pain    Cancer (HCC)    Skin, basal cell   Dyspnea    Fatigue    GERD (gastroesophageal reflux disease)    Headache(784.0)    history of migraines   History of kidney stones    Hypercholesteremia    Hypertension    Leg swelling    Bilateral   Occasional tremors    mild hand tremors   Osteoarthritis    PONV (postoperative nausea and vomiting)    RBD (REM behavioral disorder)    Rheumatoid arthritis(714.0)    Scoliosis of lumbar spine    Spinal stenosis    Multiple levels   Tic douloureux    Trigeminal neuralgia 03/30/2015   Vision abnormalities    Patient Active Problem List   Diagnosis Date Noted   UTI (urinary tract infection) 08/29/2023   PAF (paroxysmal atrial fibrillation) (HCC) 07/30/2023   Need for management of chest tube 07/30/2023   Acute idiopathic pericarditis 07/26/2023   Pleural effusion 07/25/2023   Chest pain 07/25/2023   Sjogren's syndrome (HCC) 07/25/2023   Hyperthyroidism 06/07/2023   Lewy body dementia without behavioral disturbance, psychotic disturbance, mood disturbance, or anxiety (HCC) 06/08/2022   Spinal stenosis of lumbar region 06/08/2022   Gastroesophageal reflux disease with  esophagitis 04/19/2022   Speech disturbance 12/06/2021   Depression 12/06/2021   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) 09/22/2021   Mixed hyperlipidemia 08/23/2021   Other peripheral vertigo, unspecified ear 01/20/2021   Chronic heart failure with preserved ejection fraction (HCC) 11/08/2020   History of fusion of cervical spine 09/27/2020   Transient alteration of awareness 09/27/2020   Orthostatic hypotension 09/27/2020   Cervical radiculopathy 01/06/2020   Lumbar radiculopathy 01/06/2020   CKD (chronic kidney disease) stage 3, GFR 30-59 ml/min (HCC) 07/21/2019   Numbness 05/26/2019   Chronic bilateral low back pain without sciatica 09/05/2018   Arthrofibrosis of knee joint, right 02/27/2018   Facet syndrome 04/30/2017   Excessive sleepiness 10/31/2016   Avitaminosis D 03/30/2016   Idiopathic peripheral neuropathy 02/23/2016   HTN (hypertension) 02/22/2016   Other fatigue 10/20/2015   Trigeminal neuralgia 03/30/2015   REM behavioral disorder 03/30/2015   Polypharmacy 09/11/2014   Other long term (current) drug therapy 09/11/2014   Arthritis or polyarthritis, rheumatoid (HCC) 05/14/2014   Generalized OA 05/14/2014   Rheumatoid arthritis (HCC) 05/14/2014   Postop Hyponatremia 01/23/2013   Postoperative anemia due to acute blood loss 01/23/2013   Pain due to unicompartmental arthroplasty of knee (HCC) 01/22/2013   Pain due to knee joint prosthesis (HCC) 01/22/2013   Mucositis oral 08/20/2012   Home Medication(s) Prior to Admission medications   Medication Sig Start Date End Date Taking? Authorizing Provider  acetaminophen (  TYLENOL) 325 MG tablet Take 2 tablets (650 mg total) by mouth every 6 (six) hours as needed for mild pain (or Fever >/= 101). 08/08/23   Barnetta Chapel, MD  amiodarone (PACERONE) 200 MG tablet Take 1 tablet (200 mg total) by mouth daily. 08/09/23   Barnetta Chapel, MD  apixaban (ELIQUIS) 5 MG TABS tablet Take 1 tablet (5 mg total)  by mouth 2 (two) times daily. 08/08/23   Berton Mount I, MD  colchicine 0.6 MG tablet Take 1 tablet (0.6 mg total) by mouth daily. 08/09/23   Barnetta Chapel, MD  feeding supplement (ENSURE ENLIVE / ENSURE PLUS) LIQD Take 237 mLs by mouth 2 (two) times daily between meals. 09/03/23   Elgergawy, Leana Roe, MD  Guaifenesin 200 MG/5ML LIQD Take 10 mLs by mouth every 4 (four) hours as needed (cough and congestion).    [provider]  irbesartan (AVAPRO) 150 MG tablet Take 1 tablet (150 mg total) by mouth daily. 08/09/23   Barnetta Chapel, MD  lamoTRIgine (LAMICTAL) 150 MG tablet Take 1 tablet (150 mg total) by mouth daily. 11/20/23   Sater, Pearletha Furl, MD  meclizine (ANTIVERT) 12.5 MG tablet Take 12.5 mg by mouth every 8 (eight) hours as needed. 08/19/23   [provider]  metoprolol tartrate (LOPRESSOR) 25 MG tablet Take 1 tablet (25 mg total) by mouth 2 (two) times daily. 08/08/23   Barnetta Chapel, MD  montelukast (SINGULAIR) 10 MG tablet Take 10 mg by mouth at bedtime.    [provider]  Mouthwashes (MOUTH RINSE) LIQD solution 15 mLs by Mouth Rinse route as needed (oral care). 08/08/23   Barnetta Chapel, MD  nitroGLYCERIN (NITROSTAT) 0.4 MG SL tablet Place 1 tablet (0.4 mg total) under the tongue every 5 (five) minutes as needed for chest pain. 08/08/23   Barnetta Chapel, MD  OXcarbazepine (TRILEPTAL) 300 MG tablet Take 1 tablet (300 mg total) by mouth 2 (two) times daily. 08/08/23   Barnetta Chapel, MD  pantoprazole (PROTONIX) 40 MG tablet Take 1 tablet (40 mg total) by mouth daily. 08/09/23   Barnetta Chapel, MD  polyethylene glycol (MIRALAX / GLYCOLAX) 17 g packet Take 17 g by mouth daily as needed for mild constipation. 08/08/23   Barnetta Chapel, MD  pravastatin (PRAVACHOL) 40 MG tablet Take 40 mg by mouth daily before breakfast.     [provider]  pyridostigmine (MESTINON) 60 MG tablet Take 1 tablet (60 mg total) by mouth every 8  (eight) hours. 08/08/23   Barnetta Chapel, MD  sertraline (ZOLOFT) 50 MG tablet Take 1 tablet by mouth once daily 01/22/23   Sater, Pearletha Furl, MD  spironolactone (ALDACTONE) 25 MG tablet Take 0.5 tablets (12.5 mg total) by mouth daily. 08/09/23   Barnetta Chapel, MD  valACYclovir (VALTREX) 500 MG tablet Take 1,000 mg by mouth every 12 (twelve) hours as needed (for fever blisters).    [provider]  Past Surgical History Past Surgical History:  Procedure Laterality Date   ABDOMINAL HYSTERECTOMY     AUGMENTATION MAMMAPLASTY Bilateral    COLONOSCOPY     CYSTOSCOPY W/ URETERAL STENT PLACEMENT Left 08/30/2023   Procedure: CYSTOSCOPY WITH RETROGRADE PYELOGRAM/ LEFT URETERAL STENT PLACEMENT;  Surgeon: Alfredo Martinez, MD;  Location: [facility] OR;  Service: Urology;  Laterality: Left;   IR THORACENTESIS ASP PLEURAL SPACE W/IMG GUIDE  07/26/2023   JOINT REPLACEMENT     right uni knee, right hip with 2 revisions   KIDNEY STONE SURGERY Bilateral    KNEE ARTHROTOMY Right 02/27/2018   Procedure: RIGHT KNEE ARTHROTOMY; scar excision;  Surgeon: Ollen Gross, MD;  Location: [facility] ORS;  Service: Orthopedics;  Laterality: Right;   MOHS SURGERY     RIGHT/LEFT HEART CATH AND CORONARY ANGIOGRAPHY N/A 07/30/2023   Procedure: RIGHT/LEFT HEART CATH AND CORONARY ANGIOGRAPHY;  Surgeon: Kathleene Hazel, MD;  Location: [facility] INVASIVE CV LAB;  Service: Cardiovascular;  Laterality: N/A;   TONSILLECTOMY     TOTAL HIP ARTHROPLASTY     TOTAL KNEE ARTHROPLASTY  01/22/2013   Procedure: TOTAL KNEE ARTHROPLASTY;  Surgeon: Loanne Drilling, MD;  Location: [facility] ORS;  Service: Orthopedics;  Laterality: Right;  Revision of a Right Uni Knee to a Total Knee Arthroplasty   Family History Family History  Problem Relation Age of Onset   Heart disease Mother    Parkinson's disease Father     Social  History Social History   Tobacco Use   Smoking status: Never   Smokeless tobacco: Never  Vaping Use   Vaping status: Never Used  Substance Use Topics   Alcohol use: Yes    Alcohol/week: 0.0 standard drinks of alcohol    Comment: occasional   Drug use: No   Allergies Morphine and codeine, Prevacid [lansoprazole], Provigil [modafinil], and Adhesive [tape]  Review of Systems Review of Systems  Physical Exam Vital Signs  I have reviewed the triage vital signs BP 132/74   Pulse 71   Temp 98.5 F (36.9 C)   Resp 12   SpO2 100%   Physical Exam Vitals and nursing note reviewed.  HENT:     Head: Normocephalic.  Eyes:     Pupils: Pupils are equal, round, and reactive to light.  Pulmonary:     Effort: Pulmonary effort is normal.     Breath sounds: Normal breath sounds.  Abdominal:     General: Abdomen is flat. There is no distension.     Palpations: Abdomen is soft.     Tenderness: There is no abdominal tenderness. There is no guarding.  Genitourinary:    Rectum: Guaiac result positive.  Musculoskeletal:        General: Normal range of motion.     Cervical back: Normal range of motion.  Skin:    General: Skin is warm and dry.  Neurological:     Mental Status: She is alert. Mental status is at baseline.     ED Results and Treatments Labs (all labs ordered are listed, but only abnormal results are displayed) Labs Reviewed  BASIC METABOLIC PANEL - Abnormal; Notable for the following components:      Result Value   Sodium 134 (*)    CO2 21 (*)    Creatinine, Ser 1.19 (*)    Calcium 8.3 (*)    GFR, Estimated 47 (*)    All other components within normal limits  CBC - Abnormal; Notable for the following components:   RBC 3.03 (*)  Hemoglobin 6.6 (*)    HCT 23.2 (*)    MCV 76.6 (*)    MCH 21.8 (*)    MCHC 28.4 (*)    RDW 15.7 (*)    All other components within normal limits  BRAIN NATRIURETIC PEPTIDE - Abnormal; Notable for the following components:   B  Natriuretic Peptide 860.7 (*)    All other components within normal limits  POC OCCULT BLOOD, ED  TYPE AND SCREEN  PREPARE RBC (CROSSMATCH)  TROPONIN I (HIGH SENSITIVITY)  TROPONIN I (HIGH SENSITIVITY)                                                                                                                          Radiology CT ABDOMEN PELVIS W CONTRAST Result Date: 01/22/2024 CLINICAL DATA:  Lower GI bleed.  Decreased blood counts. EXAM: CT ABDOMEN AND PELVIS WITH CONTRAST TECHNIQUE: Multidetector CT imaging of the abdomen and pelvis was performed using the standard protocol following bolus administration of intravenous contrast. RADIATION DOSE REDUCTION: This exam was performed according to the departmental dose-optimization program which includes automated exposure control, adjustment of the mA and/or kV according to patient size and/or use of iterative reconstruction technique. CONTRAST:  75mL OMNIPAQUE IOHEXOL 350 MG/ML SOLN COMPARISON:  Noncontrast CT 08/29/2023 FINDINGS: Lower chest: Small bilateral pleural effusions, unchanged from prior exam on the right, new from prior exam on the left. Bilateral breast implants. Hepatobiliary: No focal liver abnormality. Gallbladder physiologically distended, no calcified stone. No biliary dilatation. Pancreas: Atrophic.  No ductal dilatation or inflammation. Spleen: Normal in size without focal abnormality. Adrenals/Urinary Tract: Normal adrenal glands. Left nephroureteral stent in place with proximal pigtail in the upper kidney and distal pigtail in the pelvis, partially obscured by streak artifact. There is similar left hydronephrosis to prior exam. Multiple left intrarenal calculi, largest in the region of the renal pelvis measuring 15 mm. No stones in the ureter along the course of the stent. There is no right hydronephrosis or renal calculi. No perinephric inflammation. Urinary bladder is partially obscured by streak artifact from right hip  arthroplasty. Stomach/Bowel: Bowel assessment is limited in the absence of enteric contrast and paucity of intra-abdominal fat. The stomach is decompressed. There is suggestion of wall thickening involving proximal jejunal small bowel, for example series 4, image 35 and series 7, image 44. No associated perienteric fat stranding or inflammation. No other small bowel wall thickening. Pelvic bowel loops are pars normal appendix is potentially visualized. Moderate volume of colonic stool. There is no obvious colonic wall thickening or colonic mass. No definite diverticular changes. Vascular/Lymphatic: Aortic atherosclerosis without aneurysm. The portal vein is patent. No enlarged lymph nodes. Reproductive: Obscured by streak artifact from right hip arthroplasty. No obvious adnexal mass. Other: No ascites.  No free air.  Mild generalized body wall edema. Musculoskeletal: Right hip arthroplasty. Scoliosis and degenerative change in the lumbar spine. There are no acute or suspicious osseous abnormalities. IMPRESSION: 1. Bowel assessment is limited in the absence  of enteric contrast and paucity of intra-abdominal fat. There is suggestion of wall thickening involving proximal jejunal small bowel. No adjacent wall thickening to suggest enteritis. 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 2. No colonic inflammation or evidence of colonic mass, although colon in the pelvis is partially obscured by streak artifact from right hip arthroplasty. 3. Left nephroureteral stent in place with similar left hydronephrosis to prior exam. Multiple left intrarenal calculi, largest in the region of the renal pelvis measuring 15 mm. 4. Small bilateral pleural effusions, unchanged from prior exam on the right, new from prior exam on the left. Aortic Atherosclerosis (ICD10-I70.0). Electronically Signed   By: Narda Rutherford M.D.   On: 01/22/2024 19:20   DG Chest 2 View Result Date: 01/22/2024 CLINICAL DATA:  Chest pain, hypertension,  nonsmoker EXAM: CHEST - 2 VIEW COMPARISON:  10/16/2023 FINDINGS: Small bilateral pleural effusions, increased on the right compared to 10/16/2023. Fluid tracks into the right minor fissure. Hazy opacity in the right costophrenic angle is likely projectional artifact. No focal consolidation or pneumothorax. Stable cardiomediastinal silhouette. No displaced rib fracture. IMPRESSION: Increased small right and similar small left pleural effusions compared to 10/16/2023. Electronically Signed   By: Minerva Fester M.D.   On: 01/22/2024 16:13    Pertinent labs & imaging results that were available during my care of the patient were reviewed by me and considered in my medical decision making (see MDM for details).  Medications Ordered in ED Medications  0.9 %  sodium chloride infusion (Manually program via Guardrails IV Fluids) (has no administration in time range)  iohexol (OMNIPAQUE) 350 MG/ML injection 75 mL (75 mLs Intravenous Contrast Given 01/22/24 1907)                                                                                                                                     Procedures .Critical Care  Performed by: Arletha Pili, DO Authorized by: Arletha Pili, DO   Critical care provider statement:    Critical care time (minutes):  30   Critical care was necessary to treat or prevent imminent or life-threatening deterioration of the following conditions:  Circulatory failure   Critical care was time spent personally by me on the following activities:  Development of treatment plan with patient or surrogate, discussions with consultants, evaluation of patient's response to treatment, examination of patient, ordering and review of laboratory studies, ordering and review of radiographic studies, ordering and performing treatments and interventions, pulse oximetry, re-evaluation of patient's condition and review of old charts   (including critical care time)  Medical Decision  Making / ED Course   This patient presents to the ED for concern of anemia, orthopnea, this involves an extensive number of treatment options, and is a complaint that carries with it a high risk of complications and morbidity.  The differential diagnosis includes GI bleed, high-output heart failure, anemia of chronic disease, pulmonary  edema, CHF.  MDM: Patient's last hemoglobin in our system is 10.6.  Has been reported dark stools.  Did send Hemoccult.  Will obtain CT imaging the patient's abdomen pelvis.  No evidence of brisk bleed, likely pain when it has been occurring over time.  Reassessment 7:30 PM-patient's hemoglobin 6.6.  Have begun to transfuse the patient.  CT imaging showed an area of swelling in the duodenum, possibly consistent with a lymphoma.  Patient's Hemoccult was negative.  Will admit patient to hospitalist for further workup of her anemia.   Additional history obtained: -Additional history obtained from family at bedside -External records from outside source obtained and reviewed including: Chart review including previous notes, labs, imaging, consultation notes   Lab Tests: -I ordered, reviewed, and interpreted labs.   The pertinent results include:   Labs Reviewed  BASIC METABOLIC PANEL - Abnormal; Notable for the following components:      Result Value   Sodium 134 (*)    CO2 21 (*)    Creatinine, Ser 1.19 (*)    Calcium 8.3 (*)    GFR, Estimated 47 (*)    All other components within normal limits  CBC - Abnormal; Notable for the following components:   RBC 3.03 (*)    Hemoglobin 6.6 (*)    HCT 23.2 (*)    MCV 76.6 (*)    MCH 21.8 (*)    MCHC 28.4 (*)    RDW 15.7 (*)    All other components within normal limits  BRAIN NATRIURETIC PEPTIDE - Abnormal; Notable for the following components:   B Natriuretic Peptide 860.7 (*)    All other components within normal limits  POC OCCULT BLOOD, ED  TYPE AND SCREEN  PREPARE RBC (CROSSMATCH)  TROPONIN I (HIGH  SENSITIVITY)  TROPONIN I (HIGH SENSITIVITY)      EKG my dependent review the EKG shows no ST segment depressions or elevation, no T wave inversions, no evidence of acute ischemia.  EKG Interpretation Date/Time:  Tuesday January 22 2024 15:19:19 EST Ventricular Rate:  69 PR Interval:  168 QRS Duration:  74 QT Interval:  414 QTC Calculation: 443 R Axis:   -49  Text Interpretation: Normal sinus rhythm Left axis deviation Anterior infarct , age undetermined Abnormal ECG When compared with ECG of 29-Aug-2023 19:17, PREVIOUS ECG IS PRESENT Confirmed by Anders Simmonds [id number]) on 01/22/2024 7:32:07 PM         Imaging Studies ordered: I ordered imaging studies including  I independently visualized and interpreted imaging. I agree with the radiologist interpretation   Medicines ordered and prescription drug management: Meds ordered this encounter  Medications   0.9 %  sodium chloride infusion (Manually program via Guardrails IV Fluids)   iohexol (OMNIPAQUE) 350 MG/ML injection 75 mL    -I have reviewed the patients home medicines and have made adjustments as needed  Critical interventions Symptomatic anemia, blood transfusion  Cardiac Monitoring: The patient was maintained on a cardiac monitor.  I personally viewed and interpreted the cardiac monitored which showed an underlying rhythm of: Sinus rhythm  Social Determinants of Health:  Factors impacting patients care include: Multiple medical comorbidities including Lewy body dementia.   Reevaluation: After the interventions noted above, I reevaluated the patient and found that they have :improved  Co morbidities that complicate the patient evaluation  Past Medical History:  Diagnosis Date   Back pain    Cancer (HCC)    Skin, basal cell   Dyspnea    Fatigue  GERD (gastroesophageal reflux disease)    Headache(784.0)    history of migraines   History of kidney stones    Hypercholesteremia    Hypertension    Leg  swelling    Bilateral   Occasional tremors    mild hand tremors   Osteoarthritis    PONV (postoperative nausea and vomiting)    RBD (REM behavioral disorder)    Rheumatoid arthritis(714.0)    Scoliosis of lumbar spine    Spinal stenosis    Multiple levels   Tic douloureux    Trigeminal neuralgia 03/30/2015   Vision abnormalities       Dispostion: Admission to hospitalist     Final Clinical Impression(s) / ED Diagnoses Final diagnoses:  Anemia, unspecified type     @PCDICTATION @    Anders Simmonds T, DO 01/22/24 1933

## 2024-01-23 ENCOUNTER — Inpatient Hospital Stay (HOSPITAL_COMMUNITY): Payer: Medicare PPO

## 2024-01-23 ENCOUNTER — Other Ambulatory Visit (HOSPITAL_COMMUNITY): Payer: Self-pay

## 2024-01-23 DIAGNOSIS — M7989 Other specified soft tissue disorders: Secondary | ICD-10-CM | POA: Diagnosis not present

## 2024-01-23 DIAGNOSIS — R933 Abnormal findings on diagnostic imaging of other parts of digestive tract: Secondary | ICD-10-CM | POA: Diagnosis not present

## 2024-01-23 DIAGNOSIS — D509 Iron deficiency anemia, unspecified: Secondary | ICD-10-CM | POA: Diagnosis not present

## 2024-01-23 DIAGNOSIS — R195 Other fecal abnormalities: Secondary | ICD-10-CM

## 2024-01-23 DIAGNOSIS — I5033 Acute on chronic diastolic (congestive) heart failure: Secondary | ICD-10-CM

## 2024-01-23 DIAGNOSIS — D649 Anemia, unspecified: Secondary | ICD-10-CM | POA: Diagnosis not present

## 2024-01-23 LAB — ECHOCARDIOGRAM COMPLETE
Area-P 1/2: 4.39 cm2
Calc EF: 58.6 %
Height: 70 in
S' Lateral: 2.6 cm
Single Plane A2C EF: 51.2 %
Single Plane A4C EF: 64.9 %
Weight: 1809.54 [oz_av]

## 2024-01-23 LAB — BPAM RBC
Blood Product Expiration Date: 202502242359
ISSUE DATE / TIME: 202501282005
Unit Type and Rh: 5100

## 2024-01-23 LAB — FOLATE: Folate: 7.2 ng/mL (ref >5.9)

## 2024-01-23 LAB — TYPE AND SCREEN
ABO/RH(D): O POS
Antibody Screen: NEGATIVE
Unit division: 0

## 2024-01-23 LAB — RETICULOCYTES
Immature Retic Fract: 16.3 % — ABNORMAL HIGH (ref 2.3–15.9)
RBC.: 3.34 MIL/uL — ABNORMAL LOW (ref 3.87–5.11)
Retic Count, Absolute: 44.1 10*3/uL (ref 19.0–186.0)
Retic Ct Pct: 1.3 % (ref 0.4–3.1)

## 2024-01-23 LAB — HEMOGLOBIN AND HEMATOCRIT, BLOOD
HCT: 25.7 % — ABNORMAL LOW (ref 36.0–46.0)
Hemoglobin: 8.1 g/dL — ABNORMAL LOW (ref 12.0–15.0)

## 2024-01-23 LAB — IRON AND TIBC
Iron: 36 ug/dL (ref 28–170)
Saturation Ratios: 9 % — ABNORMAL LOW (ref 10.4–31.8)
TIBC: 395 ug/dL (ref 250–450)
UIBC: 359 ug/dL

## 2024-01-23 LAB — FERRITIN: Ferritin: 18 ng/mL (ref 11–307)

## 2024-01-23 LAB — VITAMIN B12: Vitamin B-12: 197 pg/mL (ref 180–914)

## 2024-01-23 MED ORDER — METOPROLOL TARTRATE 25 MG PO TABS
25.0000 mg | ORAL_TABLET | Freq: Two times a day (BID) | ORAL | Status: DC
  Administered 2024-01-23 – 2024-01-27 (×7): 25 mg via ORAL
  Filled 2024-01-23 (×7): qty 1

## 2024-01-23 MED ORDER — IRBESARTAN 150 MG PO TABS
150.0000 mg | ORAL_TABLET | Freq: Every day | ORAL | Status: DC
  Administered 2024-01-23 – 2024-01-27 (×4): 150 mg via ORAL
  Filled 2024-01-23 (×5): qty 1

## 2024-01-23 MED ORDER — PERFLUTREN LIPID MICROSPHERE
1.0000 mL | INTRAVENOUS | Status: AC | PRN
  Administered 2024-01-23: 1 mL via INTRAVENOUS

## 2024-01-23 NOTE — Progress Notes (Addendum)
PROGRESS NOTE    Kristen Hansen  ZOX:[id number] DOB: 1946/04/17 DOA: 01/22/2024 PCP: Eloisa Northern, MD   Brief Narrative: Kristen Hansen is a 78 y.o. female with a history of hypertension, hypothyroidism, dementia, chronic heart failure preserved EF, paroxysmal atrial fibrillation, pericarditis, Sjogren syndrome, trigeminal neuralgia, anxiety, depression, REM behavior disorder.  Patient presented secondary to an abnormal lab significant for anemia in addition to shortness of breath and dark stools.  Presentation concerning for possible GI bleeding, however initial FOBT was negative.  CT imaging significant for small bowel wall thickening concerning for possible lymphoma.  GI consulted.   Assessment/Plan:  Symptomatic anemia Unclear chronicity. Previously baseline hemoglobin off 10.6. Hemoglobin of 6.6 on admission. Initial FOBT negative. CT abdomen/pelvis significant for no etiology for bleeding. 1 unit of PRBC ordered. GI consulted. -FOBT -Add-on anemia panel if able  Small bowel wall thickening  Noted on CT imaging concerning for possibly small bowel lymphoma.   Chronic HFpEF Elevated BNP. No clinical evidence of acute heart failure, however. Transthoracic Echocardiogram ordered on admission.  Bilateral LE edema Seems likely related to heart failure, although there is concern for possible DVT on exam. Patient has been off of Eliquis, as well. LE venous duplex ordered. -Follow-up LE venous duplex results  Paroxysmal atrial fibrillation Currently in sinus rhythm and rate controlled. Patient is on metoprolol for rate control, in addition to Eliquis for stroke prevention; she is nonadherent with Eliquis. Patient is listed as prescribed amiodarone but is not currently taking. -Resume home metoprolol  Primary hypertension Patient is on irbesartan, spironolactone, metoprolol as an outpatient. Irbesartan and spironolactone held. -Continue metoprolol -Resume irbesartan  Trigeminal  neuralgia -Continue Lamictal and Trileptal  CKD stage IIIa Stable.  GERD Patient is on Protonix as an outpatient, but listed as not taking.  CAD Noted.  Non-anion gap metabolic acidosis Mild.  Depression Anxiety -Continue Zoloft  Hyperthyroidism -Continue methimazole and resume metoprolol  Allergic rhinitis -Continue Singulair  Lewy body dementia Patient is on pyridostigmine as an outpatient and follows with neurology.  Nephrolithiasis Left hydronephrosis Noted on CT imaging. Patient with a left nephroureteral stent in place.  Aortic atherosclerosis Noted on CT imaging. Patient is listed as prescribed pravastatin as an outpatient.   DVT prophylaxis: SCDs Code Status:   Code Status: Limited: Do not attempt resuscitation (DNR) -DNR-LIMITED -Do Not Intubate/DNI  Family Communication: Husband and daughter at bedside Disposition Plan: Discharge pending ongoing GI recommendations/management   Consultants:  [facility]  Procedures:  None  Antimicrobials: None    Subjective: No specific concerns. No issues overnight.  Objective: BP 138/86   Pulse 78   Temp 97.9 F (36.6 C) (Oral)   Resp 13   Ht 5\' 10"  (1.778 m)   Wt 51.3 kg   SpO2 100%   BMI 16.23 kg/m   Examination:  General exam: Appears calm and comfortable Respiratory system: Clear to auscultation. Respiratory effort normal. Cardiovascular system: S1 & S2 heard, RRR. No murmurs. Gastrointestinal system: Abdomen is nondistended, soft and nontender. Normal bowel sounds heard. Central nervous system: Alert. No focal neurological deficits. Musculoskeletal: No edema. No calf tenderness Psychiatry: Judgement and insight appear normal. Mood & affect appropriate.    Data Reviewed: I have personally reviewed following labs and imaging studies   Last CBC Lab Results  Component Value Date   WBC 6.5 01/22/2024   HGB 8.1 (L) 01/23/2024   HCT 25.7 (L) 01/23/2024   MCV 76.6 (L) 01/22/2024   MCH 21.8  (L) 01/22/2024  RDW 15.7 (H) 01/22/2024   PLT 342 01/22/2024     Last metabolic panel Lab Results  Component Value Date   GLUCOSE 83 01/22/2024   NA 134 (L) 01/22/2024   K 4.3 01/22/2024   CL 103 01/22/2024   CO2 21 (L) 01/22/2024   BUN 21 01/22/2024   CREATININE 1.19 (H) 01/22/2024   GFRNONAA 47 (L) 01/22/2024   CALCIUM 8.3 (L) 01/22/2024   PHOS 2.5 09/03/2023   PROT 4.7 (L) 08/30/2023   ALBUMIN 2.0 (L) 08/30/2023   LABGLOB 2.1 06/03/2021   LABGLOB 2.4 06/03/2021   AGRATIO 2.0 06/03/2021   BILITOT 0.4 08/30/2023   ALKPHOS 112 08/30/2023   AST 20 08/30/2023   ALT 22 08/30/2023   ANIONGAP 10 01/22/2024     Creatinine Clearance: Estimated Creatinine Clearance: 32.1 mL/min (A) (by C-G formula based on SCr of 1.19 mg/dL (H)).  No results found for this or any previous visit (from the past 240 hours).    Radiology Studies: CT ABDOMEN PELVIS W CONTRAST Result Date: 01/22/2024 CLINICAL DATA:  Lower GI bleed.  Decreased blood counts. EXAM: CT ABDOMEN AND PELVIS WITH CONTRAST TECHNIQUE: Multidetector CT imaging of the abdomen and pelvis was performed using the standard protocol following bolus administration of intravenous contrast. RADIATION DOSE REDUCTION: This exam was performed according to the departmental dose-optimization program which includes automated exposure control, adjustment of the mA and/or kV according to patient size and/or use of iterative reconstruction technique. CONTRAST:  75mL OMNIPAQUE IOHEXOL 350 MG/ML SOLN COMPARISON:  Noncontrast CT 08/29/2023 FINDINGS: Lower chest: Small bilateral pleural effusions, unchanged from prior exam on the right, new from prior exam on the left. Bilateral breast implants. Hepatobiliary: No focal liver abnormality. Gallbladder physiologically distended, no calcified stone. No biliary dilatation. Pancreas: Atrophic.  No ductal dilatation or inflammation. Spleen: Normal in size without focal abnormality. Adrenals/Urinary Tract: Normal  adrenal glands. Left nephroureteral stent in place with proximal pigtail in the upper kidney and distal pigtail in the pelvis, partially obscured by streak artifact. There is similar left hydronephrosis to prior exam. Multiple left intrarenal calculi, largest in the region of the renal pelvis measuring 15 mm. No stones in the ureter along the course of the stent. There is no right hydronephrosis or renal calculi. No perinephric inflammation. Urinary bladder is partially obscured by streak artifact from right hip arthroplasty. Stomach/Bowel: Bowel assessment is limited in the absence of enteric contrast and paucity of intra-abdominal fat. The stomach is decompressed. There is suggestion of wall thickening involving proximal jejunal small bowel, for example series 4, image 35 and series 7, image 44. No associated perienteric fat stranding or inflammation. No other small bowel wall thickening. Pelvic bowel loops are pars normal appendix is potentially visualized. Moderate volume of colonic stool. There is no obvious colonic wall thickening or colonic mass. No definite diverticular changes. Vascular/Lymphatic: Aortic atherosclerosis without aneurysm. The portal vein is patent. No enlarged lymph nodes. Reproductive: Obscured by streak artifact from right hip arthroplasty. No obvious adnexal mass. Other: No ascites.  No free air.  Mild generalized body wall edema. Musculoskeletal: Right hip arthroplasty. Scoliosis and degenerative change in the lumbar spine. There are no acute or suspicious osseous abnormalities. IMPRESSION: 1. Bowel assessment is limited in the absence of enteric contrast and paucity of intra-abdominal fat. There is suggestion of wall thickening involving proximal jejunal small bowel. No adjacent wall thickening to suggest enteritis. 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 2. No colonic inflammation or evidence of colonic mass, although colon  in the pelvis is partially obscured by streak artifact  from right hip arthroplasty. 3. Left nephroureteral stent in place with similar left hydronephrosis to prior exam. Multiple left intrarenal calculi, largest in the region of the renal pelvis measuring 15 mm. 4. Small bilateral pleural effusions, unchanged from prior exam on the right, new from prior exam on the left. Aortic Atherosclerosis (ICD10-I70.0). Electronically Signed   By: Narda Rutherford M.D.   On: 01/22/2024 19:20   DG Chest 2 View Result Date: 01/22/2024 CLINICAL DATA:  Chest pain, hypertension, nonsmoker EXAM: CHEST - 2 VIEW COMPARISON:  10/16/2023 FINDINGS: Small bilateral pleural effusions, increased on the right compared to 10/16/2023. Fluid tracks into the right minor fissure. Hazy opacity in the right costophrenic angle is likely projectional artifact. No focal consolidation or pneumothorax. Stable cardiomediastinal silhouette. No displaced rib fracture. IMPRESSION: Increased small right and similar small left pleural effusions compared to 10/16/2023. Electronically Signed   By: Minerva Fester M.D.   On: 01/22/2024 16:13      LOS: 1 day    Jacquelin Hawking, MD [facility] 01/23/2024, 7:03 AM   If 7PM-7AM, please contact night-coverage [email or web address]

## 2024-01-23 NOTE — ED Notes (Signed)
PT transported to vascular

## 2024-01-23 NOTE — Consult Note (Signed)
[facility] Consult Note   History Kristen Hansen MRN # [id number]  Date of Admission: 01/22/2024 Date of Consultation: 01/23/2024 Referring physician: Dr. Narda Bonds, MD Primary Care Provider: Eloisa Northern, MD Primary Gastroenterologist: Dr Clovis Pu, [facility] consult)   Reason for Consultation/Chief Complaint: Acute on chronic anemia, abnormal CT abdomen and pelvis  Subjective  HPI:  78 year old woman brought by family to ED today for weakness and dyspnea after her labs from her rheumatologist found her to be markedly anemic. This patient has dementia and is unable to give much relevant history or review of systems.  A daughter and the patient's husband are at the bedside and have been very helpful given history to me. She has been short of breath and generally weak over the last couple of weeks and more so in the last few days.  Her husband also noticed that her stools have been black for the last several days, which seem to be something new for her.  Her appetite has been reportedly good, she has not had reports of dysphagia vomiting or noticeable weight loss.  Bowel habits generally regular.  She has multiple medical issues as noted below, and is also been chronically anemic.  Hemoglobin 6.6 on admission, got 1 unit PRBCs overnight.  She has not been passing melena, bright red blood per rectum or having hematemesis since admission in the ED.  She is found to have pulmonary edema and pleural effusions, and had just had an echocardiogram before I saw her.  She also reportedly has some left lower extremity swelling and will have a Doppler ultrasound for that as well. No aspirin or NSAID use, no history of GI bleeding that her family can recall.  Stool test FOBT negative in ED  She was seen by her regular GI physician at [facility] April 2024 for oropharyngeal dysphagia, swallowing study was planned (report not available).  That provider's note  indicates EGD and colonoscopy February 2024, dilated esophagus with patulous GEJ, biopsies negative for EOE or Barrett's.  Several diminutive tubular adenomatous polyps in the cecum.  ROS:  Mobility issues, chronic arthritic pain.  Remainder of review of systems otherwise appear to be normal except as noted above, given limitations of patient's ability to give a history due to her dementia.  All other systems are negative except as noted above in the HPI  Past Medical History Past Medical History:  Diagnosis Date   Back pain    Cancer (HCC)    Skin, basal cell   Dyspnea    Fatigue    GERD (gastroesophageal reflux disease)    Headache(784.0)    history of migraines   History of kidney stones    Hypercholesteremia    Hypertension    Leg swelling    Bilateral   Occasional tremors    mild hand tremors   Osteoarthritis    PONV (postoperative nausea and vomiting)    RBD (REM behavioral disorder)    Rheumatoid arthritis(714.0)    Scoliosis of lumbar spine    Spinal stenosis    Multiple levels   Tic [place]    Trigeminal neuralgia 03/30/2015   Vision abnormalities     Past Surgical History Past Surgical History:  Procedure Laterality Date   ABDOMINAL HYSTERECTOMY     AUGMENTATION MAMMAPLASTY Bilateral    COLONOSCOPY     CYSTOSCOPY W/ URETERAL STENT PLACEMENT Left 08/30/2023   Procedure: CYSTOSCOPY WITH RETROGRADE PYELOGRAM/ LEFT URETERAL STENT PLACEMENT;  Surgeon: Alfredo Martinez, MD;  Location: [facility] OR;  Service: Urology;  Laterality: Left;   IR THORACENTESIS ASP PLEURAL SPACE W/IMG GUIDE  07/26/2023   JOINT REPLACEMENT     right uni knee, right hip with 2 revisions   KIDNEY STONE SURGERY Bilateral    KNEE ARTHROTOMY Right 02/27/2018   Procedure: RIGHT KNEE ARTHROTOMY; scar excision;  Surgeon: Ollen Gross, MD;  Location: [facility];  Service: Orthopedics;  Laterality: Right;   MOHS SURGERY     RIGHT/LEFT HEART CATH AND CORONARY ANGIOGRAPHY N/A 07/30/2023   Procedure: RIGHT/LEFT  HEART CATH AND CORONARY ANGIOGRAPHY;  Surgeon: Kathleene Hazel, MD;  Location: [facility] INVASIVE CV LAB;  Service: Cardiovascular;  Laterality: N/A;   TONSILLECTOMY     TOTAL HIP ARTHROPLASTY     TOTAL KNEE ARTHROPLASTY  01/22/2013   Procedure: TOTAL KNEE ARTHROPLASTY;  Surgeon: Loanne Drilling, MD;  Location: [facility];  Service: Orthopedics;  Laterality: Right;  Revision of a Right Uni Knee to a Total Knee Arthroplasty    Family History Family History  Problem Relation Age of Onset   Heart disease Mother    Parkinson's disease Father     Social History Social History   Socioeconomic History   Marital status: Married    Spouse name: Not on file   Number of children: Not on file   Years of education: Not on file   Highest education level: Not on file  Occupational History   Not on file  Tobacco Use   Smoking status: Never   Smokeless tobacco: Never  Vaping Use   Vaping status: Never Used  Substance and Sexual Activity   Alcohol use: Yes    Alcohol/week: 0.0 standard drinks of alcohol    Comment: occasional   Drug use: No   Sexual activity: Not on file  Other Topics Concern   Not on file  Social History Narrative   Lives with her Husband    Caffeine use: 1 Cup of Tea   Social Drivers of Health   Financial Resource Strain: Low Risk  (08/22/2022)   [facility] prior to 02/24/2023., [facility] visits prior to 02/24/2023.   Overall Financial Resource Strain (CARDIA)    Difficulty of Paying Living Expenses: Not very hard  Food Insecurity: No Food Insecurity (01/23/2024)   Hunger Vital Sign    Worried About Running Out of Food in the Last Year: Never true    Ran Out of Food in the Last Year: Never true  Transportation Needs: No Transportation Needs (01/23/2024)   PRAPARE - Administrator, Civil Service (Medical): No    Lack of Transportation (Non-Medical): No  Physical  Activity: Inactive (08/22/2022)   [facility], [facility] prior to 02/24/2023., [facility], [facility] visits prior to 02/24/2023.   Exercise Vital Sign    Days of Exercise per Week: 0 days    Minutes of Exercise per Session: 0 min  Stress: No Stress Concern Present (08/22/2022)   [facility] prior to 02/24/2023., [facility] visits prior to 02/24/2023.   [employer] of Occupational Health - Occupational Stress Questionnaire    Feeling of Stress : Only a little  Social Connections: Socially Isolated (01/23/2024)   Social Connection and Isolation Panel [NHANES]    Frequency of Communication with Friends and Family: Once a  week    Frequency of Social Gatherings with Friends and Family: Once a week    Attends Religious Services: Never    Database administrator or Organizations: No    Attends Engineer, structural: Never    Marital Status: Married    Allergies Allergies  Allergen Reactions   Morphine And Codeine Hives   Prevacid [Lansoprazole] Other (See Comments)    unknown   Provigil [Modafinil] Other (See Comments)    dizziness   Adhesive [Tape] Rash    bandaids     Outpatient Meds Home medications from the H+P and/or nursing med reconciliation reviewed.  Inpatient med list reviewed  _____________________________________________________________________ Objective   Exam:  Current vital signs  Patient Vitals for the past 8 hrs:  BP Temp Temp src Pulse Resp SpO2  01/23/24 1301 (!) 169/83 98.7 F (37.1 C) Oral 74 16 99 %  01/23/24 1136 -- (!) 97.5 F (36.4 C) Oral -- -- --  01/23/24 1115 (!) 168/82 -- -- 77 13 100 %  01/23/24 1100 (!) 159/84 -- -- 76 11 98 %  01/23/24 0945 (!) 164/88 -- -- 80 (!) 23 97 %    Intake/Output Summary (Last 24 hours) at 01/23/2024 1643 Last data filed at 01/23/2024  1341 Gross per 24 hour  Intake 0 ml  Output --  Net 0 ml    Physical Exam:   General: this is a pleasant elderly female patient in no acute distress, lying on a stretcher in the ED with family at the bedside.  She is pale.  Conversational but confused. Eyes: sclera anicteric, no redness ENT: oral mucosa moist without lesions, no cervical or supraclavicular lymphadenopathy CV: RRR without murmur, S1/S2, no JVD,, mild left calf/ankle edema compared to right Resp: Decreased breath sounds and crackles bilateral lower lungs, normal RR and effort noted GI: soft, no tenderness, with active bowel sounds. No guarding or palpable organomegaly noted.   Midline lower scar from prior hysterectomy Skin; warm and dry, no rash or jaundice noted -pale Moves all extremities  Labs:     Latest Ref Rng & Units 01/23/2024    5:43 AM 01/22/2024    4:10 PM 09/03/2023    5:49 AM  CBC  WBC 4.0 - 10.5 K/uL  6.5  7.4   Hemoglobin 12.0 - 15.0 g/dL 8.1  6.6  29.5   Hematocrit 36.0 - 46.0 % 25.7  23.2  33.8   Platelets 150 - 400 K/uL  342  262        Latest Ref Rng & Units 01/22/2024    4:10 PM 09/03/2023    5:49 AM 09/02/2023    7:58 AM  CMP  Glucose 70 - 99 mg/dL 83  83  80   BUN 8 - 23 mg/dL 21  7  9    Creatinine 0.44 - 1.00 mg/dL 6.21  3.08  6.57   Sodium 135 - 145 mmol/L 134  132  132   Potassium 3.5 - 5.1 mmol/L 4.3  4.1  4.4   Chloride 98 - 111 mmol/L 103  98  98   CO2 22 - 32 mmol/L 21  25  22    Calcium 8.9 - 10.3 mg/dL 8.3  8.1  8.1    BNP 846    No results for input(s): "INR" in the last 168 hours. _________________________________________________________ Radiologic studies:  CLINICAL DATA:  Chest pain, hypertension, nonsmoker   EXAM: CHEST - 2 VIEW   COMPARISON:  10/16/2023   FINDINGS: Small bilateral  pleural effusions, increased on the right compared to 10/16/2023. Fluid tracks into the right minor fissure. Hazy opacity in the right costophrenic angle is likely  projectional artifact. No focal consolidation or pneumothorax. Stable cardiomediastinal silhouette. No displaced rib fracture.   IMPRESSION: Increased small right and similar small left pleural effusions compared to 10/16/2023.     Electronically Signed   By: Minerva Fester M.D.   On: 01/22/2024 16:13 ______________________________________________________ Other studies: CLINICAL DATA:  Lower GI bleed.  Decreased blood counts.   EXAM: CT ABDOMEN AND PELVIS WITH CONTRAST   TECHNIQUE: Multidetector CT imaging of the abdomen and pelvis was performed using the standard protocol following bolus administration of intravenous contrast.   RADIATION DOSE REDUCTION: This exam was performed according to the departmental dose-optimization program which includes automated exposure control, adjustment of the mA and/or kV according to patient size and/or use of iterative reconstruction technique.   CONTRAST:  75mL OMNIPAQUE IOHEXOL 350 MG/ML SOLN   COMPARISON:  Noncontrast CT 08/29/2023   FINDINGS: Lower chest: Small bilateral pleural effusions, unchanged from prior exam on the right, new from prior exam on the left. Bilateral breast implants.   Hepatobiliary: No focal liver abnormality. Gallbladder physiologically distended, no calcified stone. No biliary dilatation.   Pancreas: Atrophic.  No ductal dilatation or inflammation.   Spleen: Normal in size without focal abnormality.   Adrenals/Urinary Tract: Normal adrenal glands. Left nephroureteral stent in place with proximal pigtail in the upper kidney and distal pigtail in the pelvis, partially obscured by streak artifact. There is similar left hydronephrosis to prior exam. Multiple left intrarenal calculi, largest in the region of the renal pelvis measuring 15 mm. No stones in the ureter along the course of the stent. There is no right hydronephrosis or renal calculi. No perinephric inflammation. Urinary bladder is partially  obscured by streak artifact from right hip arthroplasty.   Stomach/Bowel: Bowel assessment is limited in the absence of enteric contrast and paucity of intra-abdominal fat. The stomach is decompressed. There is suggestion of wall thickening involving proximal jejunal small bowel, for example series 4, image 35 and series 7, image 44. No associated perienteric fat stranding or inflammation. No other small bowel wall thickening. Pelvic bowel loops are pars normal appendix is potentially visualized. Moderate volume of colonic stool. There is no obvious colonic wall thickening or colonic mass. No definite diverticular changes.   Vascular/Lymphatic: Aortic atherosclerosis without aneurysm. The portal vein is patent. No enlarged lymph nodes.   Reproductive: Obscured by streak artifact from right hip arthroplasty. No obvious adnexal mass.   Other: No ascites.  No free air.  Mild generalized body wall edema.   Musculoskeletal: Right hip arthroplasty. Scoliosis and degenerative change in the lumbar spine. There are no acute or suspicious osseous abnormalities.   IMPRESSION: 1. Bowel assessment is limited in the absence of enteric contrast and paucity of intra-abdominal fat. There is suggestion of wall thickening involving proximal jejunal small bowel. No adjacent wall thickening to suggest enteritis. 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 2. No colonic inflammation or evidence of colonic mass, although colon in the pelvis is partially obscured by streak artifact from right hip arthroplasty. 3. Left nephroureteral stent in place with similar left hydronephrosis to prior exam. Multiple left intrarenal calculi, largest in the region of the renal pelvis measuring 15 mm. 4. Small bilateral pleural effusions, unchanged from prior exam on the right, new from prior exam on the left.   Aortic Atherosclerosis (ICD10-I70.0).     Electronically Signed  By: Narda Rutherford M.D.    On: 01/22/2024 19:20  _______________________________________________________ Assessment & Plan  Impression: Acute on chronic anemia, has been normocytic up to this admission when it is now microcytic.  Possibly multifactorial.  Altered character of stool, reportedly black stool last few days.  Unknown if this represents GI bleeding, as the patient is reportedly FOBT negative now.  Abnormal GI imaging-CT abdomen and pelvis done for anemia with concern for GI blood loss.  This shows some nonspecific possible thickening of the proximal jejunum.  I reviewed these images, they are limited by the lack of oral contrast and difficult to say if they represent true inflammation or neoplastic process such as the lymphoma suggested by radiology.  No adjacent adenopathy or other abdominal/retroperitoneal adenopathy described.  Chronic left renal/ureteral stones with indwelling stent  Mild volume overload with small bilateral pleural effusions, unknown if any element of heart failure (echo results pending).  Elevated BNP suggests this could be a contributing factor.  Hypoalbuminemia also contributing (last albumin on file was 2.0 in September 2024)    Plan:  Difficult to know whether she is truly having any GI bleeding at this point to account for her acute on chronic anemia, and also difficult to know what if anything is present in the proximal jejunum as described by radiology. Given these uncertainties and the overall clinical picture, I feel that a small bowel enteroscopy is reasonable for further investigation.  I do not know for sure if we can reach the area in question but it seems likely based on the CT images.  This procedure was described in detail along with risks and benefits and her daughter and husband were in agreement.    The benefits and risks of the planned procedure were described in detail with the patient or (when appropriate) their health care proxy.  Risks were outlined as including,  but not limited to, bleeding, infection, perforation, adverse medication reaction leading to cardiac or pulmonary decompensation, pancreatitis (if ERCP).  The limitation of incomplete mucosal visualization was also discussed.  No guarantees or warranties were given.   I would like to wait until a day after tomorrow to be sure the patient's volume status is stable, we have the results of echocardiogram and lower extremity Doppler as well as further monitoring of her hemoglobin after transfusion to ensure stability of all these issues.  We will check back tomorrow and make final plans.  Posttransfusion hemoglobin went up appropriately, I think it can be checked daily.  Okay to eat today and tomorrow, so I ordered a low-sodium diet.  Thank you for the courtesy of this consult.  Please contact me with any questions or concerns.  [name] Office: [phone]

## 2024-01-23 NOTE — ED Notes (Signed)
Pharmacy tech to verify home medications with pts daughter in the morning. Daughter is in charge of meds and contact information is updated in chart.

## 2024-01-23 NOTE — Progress Notes (Signed)
BLE venous duplex has been completed.   Results can be found under chart review under CV PROC. 01/23/2024 12:32 PM Jaymie Misch RVT, RDMS

## 2024-01-23 NOTE — Progress Notes (Signed)
Heart Failure Navigator Progress Note  Assessed for Heart & Vascular TOC clinic readiness.  Patient does not meet criteria due to per MD note patient with history of Dementia. .   Navigator will sign off at this time.   Rhae Hammock, BSN, Scientist, clinical (histocompatibility and immunogenetics) Only

## 2024-01-23 NOTE — ED Notes (Signed)
Pt cleaned and bed change after episode of urinary incontinence

## 2024-01-23 NOTE — Evaluation (Signed)
Occupational Therapy Evaluation Patient Details Name: Kristen Hansen MRN: [id number] DOB: 1946-11-04 Today's Date: 01/23/2024   History of Present Illness 78 y/o F presenting to ED on 1/28 with low hgb (6.6) after being seen at rheumatologist, also with SOB/CP and black stools. CT imaging with swelling in duodenum, possibly consistent with lymphoma. Admitted for symptomatic anemia. BLE ultrasound negative for DVT. PMH includes dementia, sjogren's disease, HTN, hyperthyroidism on methimazole, dementia, chronic HFpEF, paroxysmal A fib on Eliquis with noncompliance, pericarditis, trigeminal neuralgia, anxiety, depression, REM behavior disorder   Clinical Impression   Pt reports ind at baseline with ADL and uses rollator for functional mobility, pt lives with spouse and has assist from daughter who lives close by daily. Pt currently needing up to max A for ADLs, min A for bed mobility and min+2 for transfers with RW. Pt presenting with impairments listed below, will follow acutely. Recommend HHOT at d/c given family can provide level of assist needed at home.       If plan is discharge home, recommend the following: A little help with walking and/or transfers;A lot of help with bathing/dressing/bathroom;Assistance with cooking/housework;Direct supervision/assist for financial management;Direct supervision/assist for medications management;Assist for transportation;Help with stairs or ramp for entrance    Functional Status Assessment  Patient has had a recent decline in their functional status and demonstrates the ability to make significant improvements in function in a reasonable and predictable amount of time.  Equipment Recommendations  None recommended by OT    Recommendations for Other Services PT consult     Precautions / Restrictions Precautions Precautions: Fall Restrictions Weight Bearing Restrictions Per Provider Order: No      Mobility Bed Mobility Overal bed mobility: Needs  Assistance Bed Mobility: Supine to Sit, Sit to Supine     Supine to sit: Min assist          Transfers Overall transfer level: Needs assistance Equipment used: Rolling walker (2 wheels) Transfers: Sit to/from Stand Sit to Stand: +2 physical assistance, Min assist                  Balance Overall balance assessment: Needs assistance Sitting-balance support: Feet supported Sitting balance-Leahy Scale: Fair Sitting balance - Comments: mild LOB with sitting EOB   Standing balance support: During functional activity, No upper extremity supported Standing balance-Leahy Scale: Poor Standing balance comment: reliant on RW in standing                           ADL either performed or assessed with clinical judgement   ADL Overall ADL's : Needs assistance/impaired Eating/Feeding: Set up;Sitting   Grooming: Wash/dry face;Sitting;Minimal assistance   Upper Body Bathing: Minimal assistance   Lower Body Bathing: Maximal assistance   Upper Body Dressing : Minimal assistance   Lower Body Dressing: Maximal assistance   Toilet Transfer: Minimal assistance;+2 for physical assistance;Ambulation;Rolling walker (2 wheels);Regular Toilet   Toileting- Clothing Manipulation and Hygiene: Minimal assistance       Functional mobility during ADLs: Minimal assistance;+2 for safety/equipment       Vision   Vision Assessment?: No apparent visual deficits     Perception Perception: Not tested       Praxis Praxis: Not tested       Pertinent Vitals/Pain Pain Assessment Pain Assessment: Faces Pain Score: 3  Faces Pain Scale: Hurts little more Pain Location: L shoulder with ROM Pain Descriptors / Indicators: Discomfort Pain Intervention(s): Limited activity within patient's tolerance, Monitored  during session, Repositioned     Extremity/Trunk Assessment Upper Extremity Assessment Upper Extremity Assessment: Generalized weakness   Lower Extremity  Assessment Lower Extremity Assessment: Defer to PT evaluation   Cervical / Trunk Assessment Cervical / Trunk Assessment: Normal   Communication Communication Communication: No apparent difficulties   Cognition Arousal: Alert Behavior During Therapy: WFL for tasks assessed/performed Overall Cognitive Status: History of cognitive impairments - at baseline                                 General Comments: hx of dementia, WFL for BADL tasks and follows commands appropriately     General Comments  VSS on 2L    Exercises     Shoulder Instructions      Home Living Family/patient expects to be discharged to:: Private residence Living Arrangements: Spouse/significant other Available Help at Discharge: Family;Available 24 hours/day Type of Home: House Home Access: Stairs to enter [employer] of Steps: 2 Entrance Stairs-Rails: None Home Layout: Able to live on main level with bedroom/bathroom;Two level     Bathroom Shower/Tub: Chief Strategy Officer: Handicapped height     Home Equipment: Agricultural consultant (2 wheels);Rollator (4 wheels);Cane - single point;BSC/3in1;Cane - quad;Wheelchair - manual;Tub bench;Lift chair          Prior Functioning/Environment Prior Level of Function : Needs assist             Mobility Comments: using rollator up until a week ago, fatigues quickly throughout the day ADLs Comments: ind, PRN assist for ADLs overall        OT Problem List: Decreased strength;Decreased range of motion;Decreased activity tolerance;Impaired balance (sitting and/or standing);Decreased safety awareness;Cardiopulmonary status limiting activity;Decreased knowledge of precautions;Decreased cognition;Decreased knowledge of use of DME or AE      OT Treatment/Interventions: Self-care/ADL training;Therapeutic exercise;Energy conservation;DME and/or AE instruction;Therapeutic activities;Cognitive  remediation/compensation;Visual/perceptual remediation/compensation;Patient/family education;Balance training    OT Goals(Current goals can be found in the care plan section) Acute Rehab OT Goals Patient Stated Goal: none stated OT Goal Formulation: With patient Time For Goal Achievement: 02/06/24 Potential to Achieve Goals: Good ADL Goals Pt Will Perform Upper Body Dressing: with supervision;sitting Pt Will Perform Lower Body Dressing: with supervision;sit to/from stand;sitting/lateral leans Pt Will Transfer to Toilet: with supervision;ambulating;regular height toilet  OT Frequency: Min 1X/week    Co-evaluation              AM-PAC OT "6 Clicks" Daily Activity     Outcome Measure Help from another person eating meals?: A Little Help from another person taking care of personal grooming?: A Little Help from another person toileting, which includes using toliet, bedpan, or urinal?: A Lot Help from another person bathing (including washing, rinsing, drying)?: A Lot Help from another person to put on and taking off regular upper body clothing?: A Lot Help from another person to put on and taking off regular lower body clothing?: A Lot 6 Click Score: 14   End of Session Equipment Utilized During Treatment: Gait belt;Rolling walker (2 wheels);Oxygen (2L) Nurse Communication: Mobility status  Activity Tolerance: Patient tolerated treatment well Patient left: in bed;with call bell/phone within reach;with bed alarm set;with family/visitor present;with restraints reapplied  OT Visit Diagnosis: Unsteadiness on feet (R26.81);Other abnormalities of gait and mobility (R26.89);Muscle weakness (generalized) (M62.81)                Time: 1610-9604 OT Time Calculation (min): 32 min Charges:  OT  General Charges $OT Visit: 1 Visit OT Evaluation $OT Eval Moderate Complexity: 1 Mod OT Treatments $Self Care/Home Management : 8-22 mins  Carver Fila, OTD, OTR/L [employer] Preferred Acute  Rehab [phone]   Dalphine Handing 01/23/2024, 3:47 PM

## 2024-01-24 DIAGNOSIS — R933 Abnormal findings on diagnostic imaging of other parts of digestive tract: Secondary | ICD-10-CM

## 2024-01-24 DIAGNOSIS — D649 Anemia, unspecified: Secondary | ICD-10-CM | POA: Diagnosis not present

## 2024-01-24 DIAGNOSIS — D509 Iron deficiency anemia, unspecified: Secondary | ICD-10-CM | POA: Diagnosis not present

## 2024-01-24 DIAGNOSIS — R195 Other fecal abnormalities: Secondary | ICD-10-CM | POA: Diagnosis not present

## 2024-01-24 LAB — BASIC METABOLIC PANEL
Anion gap: 10 (ref 5–15)
BUN: 18 mg/dL (ref 8–23)
CO2: 21 mmol/L — ABNORMAL LOW (ref 22–32)
Calcium: 8.6 mg/dL — ABNORMAL LOW (ref 8.9–10.3)
Chloride: 105 mmol/L (ref 98–111)
Creatinine, Ser: 0.98 mg/dL (ref 0.44–1.00)
GFR, Estimated: 59 mL/min — ABNORMAL LOW (ref >60)
Glucose, Bld: 91 mg/dL (ref 70–99)
Potassium: 4.2 mmol/L (ref 3.5–5.1)
Sodium: 136 mmol/L (ref 135–145)

## 2024-01-24 LAB — CBC
HCT: 25.7 % — ABNORMAL LOW (ref 36.0–46.0)
Hemoglobin: 7.7 g/dL — ABNORMAL LOW (ref 12.0–15.0)
MCH: 23.2 pg — ABNORMAL LOW (ref 26.0–34.0)
MCHC: 30 g/dL (ref 30.0–36.0)
MCV: 77.4 fL — ABNORMAL LOW (ref 80.0–100.0)
Platelets: 300 10*3/uL (ref 150–400)
RBC: 3.32 MIL/uL — ABNORMAL LOW (ref 3.87–5.11)
RDW: 16.5 % — ABNORMAL HIGH (ref 11.5–15.5)
WBC: 7.1 10*3/uL (ref 4.0–10.5)
nRBC: 0 % (ref 0.0–0.2)

## 2024-01-24 NOTE — Progress Notes (Signed)
PROGRESS NOTE    Kristen Hansen  ZOX:[id number] DOB: 14-Sep-1946 DOA: 01/22/2024 PCP: Eloisa Northern, MD   Brief Narrative: Kristen Hansen is a 78 y.o. female with a history of hypertension, hypothyroidism, dementia, chronic heart failure preserved EF, paroxysmal atrial fibrillation, pericarditis, Sjogren syndrome, trigeminal neuralgia, anxiety, depression, REM behavior disorder.  Patient presented secondary to an abnormal lab significant for anemia in addition to shortness of breath and dark stools.  Presentation concerning for possible GI bleeding, however initial FOBT was negative.  CT imaging significant for small bowel wall thickening concerning for possible lymphoma.  GI consulted.   Assessment/Plan:  Symptomatic anemia Unclear chronicity. Previously baseline hemoglobin off 10.6. Hemoglobin of 6.6 on admission. Initial FOBT negative. CT abdomen/pelvis significant for no etiology for bleeding. 1 unit of PRBC ordered. GI consulted with plan for endoscopic procedure. Iron panel with low-normal ferritin. -FOBT -GI recommendations: pending today  Small bowel wall thickening  Noted on CT imaging concerning for possibly small bowel lymphoma. GI consulted.  Chronic HFpEF Elevated BNP. No clinical evidence of acute heart failure, however. Transthoracic Echocardiogram ordered on admission and significant for an LVEF of 55-60% with grade 2 diastolic dysfunction; moderately elevated pulmonary artery systolic pressure.  Bilateral LE edema Seems likely related to heart failure, although there is concern for possible DVT on exam. Patient has been off of Eliquis, as well. LE venous duplex ordered and significant for no DVT.  Paroxysmal atrial fibrillation Currently in sinus rhythm and rate controlled. Patient is on metoprolol for rate control, in addition to Eliquis for stroke prevention; she is nonadherent with Eliquis. Patient is listed as prescribed amiodarone but is not currently taking. -Continue  metoprolol  Primary hypertension Patient is on irbesartan, spironolactone, metoprolol as an outpatient. Irbesartan and spironolactone held. -Continue metoprolol and irbesartan  Trigeminal neuralgia -Continue Lamictal and Trileptal  CKD stage IIIa Stable.  GERD Patient is on Protonix as an outpatient, but listed as not taking.  CAD Noted.  Non-anion gap metabolic acidosis Mild.  Depression Anxiety -Continue Zoloft  Hyperthyroidism -Continue methimazole and resume metoprolol  Allergic rhinitis -Continue Singulair  Lewy body dementia Patient is on pyridostigmine as an outpatient and follows with neurology.  Nephrolithiasis Left hydronephrosis Noted on CT imaging. Patient with a left nephroureteral stent in place.  Aortic atherosclerosis Noted on CT imaging. Patient is listed as prescribed pravastatin as an outpatient.   DVT prophylaxis: SCDs Code Status:   Code Status: Limited: Do not attempt resuscitation (DNR) -DNR-LIMITED -Do Not Intubate/DNI  Family Communication: Husband at bedside Disposition Plan: Discharge pending ongoing GI recommendations/management   Consultants:  [facility]  Procedures:  None  Antimicrobials: None    Subjective: No specific concerns today.  Objective: BP (!) 183/90 (BP Location: Left Arm)   Pulse 81   Temp 97.7 F (36.5 C) (Oral)   Resp 16   Ht 5\' 10"  (1.778 m)   Wt 51.3 kg   SpO2 95%   BMI 16.23 kg/m   Examination:  General exam: Appears calm and comfortable Respiratory system: Clear to auscultation. Respiratory effort normal. Cardiovascular system: S1 & S2 heard, RRR. Gastrointestinal system: Abdomen is nondistended, soft and nontender. Normal bowel sounds heard. Central nervous system: Alert and oriented to self. Musculoskeletal: No edema. No calf tenderness   Data Reviewed: I have personally reviewed following labs and imaging studies   Last CBC Lab Results  Component Value Date   WBC 6.5 01/22/2024    HGB 8.1 (L) 01/23/2024   HCT 25.7 (L)  01/23/2024   MCV 76.6 (L) 01/22/2024   MCH 21.8 (L) 01/22/2024   RDW 15.7 (H) 01/22/2024   PLT 342 01/22/2024     Last metabolic panel Lab Results  Component Value Date   GLUCOSE 83 01/22/2024   NA 134 (L) 01/22/2024   K 4.3 01/22/2024   CL 103 01/22/2024   CO2 21 (L) 01/22/2024   BUN 21 01/22/2024   CREATININE 1.19 (H) 01/22/2024   GFRNONAA 47 (L) 01/22/2024   CALCIUM 8.3 (L) 01/22/2024   PHOS 2.5 09/03/2023   PROT 4.7 (L) 08/30/2023   ALBUMIN 2.0 (L) 08/30/2023   LABGLOB 2.1 06/03/2021   LABGLOB 2.4 06/03/2021   AGRATIO 2.0 06/03/2021   BILITOT 0.4 08/30/2023   ALKPHOS 112 08/30/2023   AST 20 08/30/2023   ALT 22 08/30/2023   ANIONGAP 10 01/22/2024     Creatinine Clearance: Estimated Creatinine Clearance: 32.1 mL/min (A) (by C-G formula based on SCr of 1.19 mg/dL (H)).  No results found for this or any previous visit (from the past 240 hours).    Radiology Studies: VAS [place] LOWER EXTREMITY VENOUS (DVT) Result Date: 01/23/2024  Lower Venous DVT Study Patient Name:  Kristen Hansen  Date of Exam:   01/23/2024 Medical Rec #: [id number]      Accession #:    [id number] Date of Birth: 19-Sep-1946      Patient Gender: F Patient Age:   62 years Exam Location:  [facility] Procedure:      VAS [place] LOWER EXTREMITY VENOUS (DVT) Referring Phys: Dow Adolph --------------------------------------------------------------------------------  Indications: Edema.  Anticoagulation: Eliquis (noncompliant). Limitations: Patient unable to cooperate. Comparison Study: Previous exam on 07/26/2023 was negative for DVT Performing Technologist: Ernestene Mention RVT, RDMS  Examination Guidelines: A complete evaluation includes B-mode imaging, spectral Doppler, color Doppler, and power Doppler as needed of all accessible portions of each vessel. Bilateral testing is considered an integral part of a complete examination. Limited examinations for reoccurring indications may be  performed as noted. The reflux portion of the exam is performed with the patient in reverse Trendelenburg.  +---------+---------------+---------+-----------+----------+--------------+ RIGHT    CompressibilityPhasicitySpontaneityPropertiesThrombus Aging +---------+---------------+---------+-----------+----------+--------------+ CFV      Full           Yes      Yes                                 +---------+---------------+---------+-----------+----------+--------------+ SFJ      Full                                                        +---------+---------------+---------+-----------+----------+--------------+ FV Prox  Full           Yes      Yes                                 +---------+---------------+---------+-----------+----------+--------------+ FV Mid   Full           Yes      Yes                                 +---------+---------------+---------+-----------+----------+--------------+ FV DistalFull  Yes      Yes                                 +---------+---------------+---------+-----------+----------+--------------+ PFV      Full                                                        +---------+---------------+---------+-----------+----------+--------------+ POP      Full           Yes      Yes                                 +---------+---------------+---------+-----------+----------+--------------+ PTV      Full                                                        +---------+---------------+---------+-----------+----------+--------------+ PERO     Full                                                        +---------+---------------+---------+-----------+----------+--------------+   +---------+---------------+---------+-----------+----------+-------------------+ LEFT     CompressibilityPhasicitySpontaneityPropertiesThrombus Aging      +---------+---------------+---------+-----------+----------+-------------------+  CFV      Full                                                             +---------+---------------+---------+-----------+----------+-------------------+ SFJ      Full                                                             +---------+---------------+---------+-----------+----------+-------------------+ FV Prox  Full                                                             +---------+---------------+---------+-----------+----------+-------------------+ FV Mid   Full                                                             +---------+---------------+---------+-----------+----------+-------------------+ FV DistalFull                                                             +---------+---------------+---------+-----------+----------+-------------------+  PFV      Full                                                             +---------+---------------+---------+-----------+----------+-------------------+ POP      Full                                                             +---------+---------------+---------+-----------+----------+-------------------+ PTV      Full                                                             +---------+---------------+---------+-----------+----------+-------------------+ PERO     Full                                         Not well visualized +---------+---------------+---------+-----------+----------+-------------------+     Summary: BILATERAL: - No evidence of deep vein thrombosis seen in the lower extremities, bilaterally. - RIGHT: - No cystic structure found in the popliteal fossa.  LEFT: - A heterogenous cystic structure is found in the popliteal fossa (4.0 x 2.33 x 3.97 cm). Subcutaneous edema of calf and ankle.  *See table(s) above for measurements and observations. Electronically signed by Heath Lark on 01/23/2024 at 4:15:49 PM.    Final    ECHOCARDIOGRAM COMPLETE Result Date: 01/23/2024     ECHOCARDIOGRAM REPORT   Patient Name:   Kristen Hansen Date of Exam: 01/23/2024 Medical Rec #:  [id number]     Height:       70.0 in Accession #:    [id number]    Weight:       113.1 lb Date of Birth:  [birth year]     BSA:          1.638 m Patient Age:    78 years      BP:           168/82 mmHg Patient Gender: F             HR:           75 bpm. Exam Location:  Inpatient Procedure: 2D Echo, Cardiac Doppler, Color Doppler and Intracardiac            Opacification Agent Indications:    CHF  History:        Patient has prior history of Echocardiogram examinations, most                 recent 07/26/2023. Mitral Valve Disease, Signs/Symptoms:Chest                 Pain; Risk Factors:Hypertension and HLD.  Sonographer:    Webb Laws Referring Phys: [id number] CAROLE N HALL IMPRESSIONS  1. Left ventricular ejection fraction, by estimation, is 55 to 60%. The left ventricle has normal function. The left ventricle has no regional wall motion abnormalities.  There is moderate concentric left ventricular hypertrophy of the basal-septal segment. Left ventricular diastolic parameters are consistent with Grade II diastolic dysfunction (pseudonormalization).  2. Right ventricular systolic function is normal. The right ventricular size is normal. There is moderately elevated pulmonary artery systolic pressure. The estimated right ventricular systolic pressure is 53.4 mmHg.  3. The mitral valve is normal in structure. Trivial mitral valve regurgitation. No evidence of mitral stenosis.  4. The aortic valve is normal in structure. Aortic valve regurgitation is not visualized. No aortic stenosis is present.  5. The inferior vena cava is normal in size with greater than 50% respiratory variability, suggesting right atrial pressure of 3 mmHg. FINDINGS  Left Ventricle: Left ventricular ejection fraction, by estimation, is 55 to 60%. The left ventricle has normal function. The left ventricle has no regional wall motion abnormalities. The left  ventricular internal cavity size was normal in size. There is  moderate concentric left ventricular hypertrophy of the basal-septal segment. Left ventricular diastolic parameters are consistent with Grade II diastolic dysfunction (pseudonormalization). Right Ventricle: The right ventricular size is normal. No increase in right ventricular wall thickness. Right ventricular systolic function is normal. There is moderately elevated pulmonary artery systolic pressure. The tricuspid regurgitant velocity is 3.55 m/s, and with an assumed right atrial pressure of 3 mmHg, the estimated right ventricular systolic pressure is 53.4 mmHg. Left Atrium: Left atrial size was normal in size. Right Atrium: Right atrial size was normal in size. Pericardium: There is no evidence of pericardial effusion. Mitral Valve: The mitral valve is normal in structure. There is mild thickening of the mitral valve leaflet(s). Trivial mitral valve regurgitation. No evidence of mitral valve stenosis. Tricuspid Valve: The tricuspid valve is normal in structure. Tricuspid valve regurgitation is not demonstrated. No evidence of tricuspid stenosis. Aortic Valve: The aortic valve is normal in structure. Aortic valve regurgitation is not visualized. No aortic stenosis is present. Pulmonic Valve: The pulmonic valve was normal in structure. Pulmonic valve regurgitation is trivial. No evidence of pulmonic stenosis. Aorta: The aortic root is normal in size and structure. Venous: The inferior vena cava is normal in size with greater than 50% respiratory variability, suggesting right atrial pressure of 3 mmHg. IAS/Shunts: No atrial level shunt detected by color flow Doppler.  LEFT VENTRICLE PLAX 2D LVIDd:         3.30 cm      Diastology LVIDs:         2.60 cm      LV e' medial:    4.68 cm/s LV PW:         1.20 cm      LV E/e' medial:  33.8 LV IVS:        1.40 cm      LV e' lateral:   7.18 cm/s LVOT diam:     1.90 cm      LV E/e' lateral: 22.0 LV SV:         49  LV SV Index:   30 LVOT Area:     2.84 cm  LV Volumes (MOD) LV vol d, MOD A2C: 70.9 ml LV vol d, MOD A4C: 105.0 ml LV vol s, MOD A2C: 34.6 ml LV vol s, MOD A4C: 36.9 ml LV SV MOD A2C:     36.3 ml LV SV MOD A4C:     105.0 ml LV SV MOD BP:      52.1 ml RIGHT VENTRICLE  IVC RV Basal diam:  4.00 cm     IVC diam: 1.80 cm RV S prime:     12.10 cm/s TAPSE (M-mode): 2.2 cm LEFT ATRIUM             Index        RIGHT ATRIUM           Index LA diam:        3.50 cm 2.14 cm/m   RA Area:     14.90 cm LA Vol (A2C):   45.4 ml 27.71 ml/m  RA Volume:   39.10 ml  23.87 ml/m LA Vol (A4C):   45.6 ml 27.84 ml/m LA Biplane Vol: 46.8 ml 28.57 ml/m  AORTIC VALVE             PULMONIC VALVE LVOT Vmax:   86.40 cm/s  PR End Diast Vel: 1.91 msec LVOT Vmean:  61.300 cm/s LVOT VTI:    0.174 m  AORTA Ao Root diam: 2.60 cm Ao Asc diam:  3.10 cm MITRAL VALVE                TRICUSPID VALVE MV Area (PHT): 4.39 cm     TR Peak grad:   50.4 mmHg MV Decel Time: 173 msec     TR Vmax:        355.00 cm/s MV E velocity: 158.00 cm/s MV A velocity: 114.00 cm/s  SHUNTS MV E/A ratio:  1.39         Systemic VTI:  0.17 m                             Systemic Diam: 1.90 cm Aditya Sabharwal Electronically signed by Dorthula Nettles Signature Date/Time: 01/23/2024/11:58:31 AM    Final    CT ABDOMEN PELVIS W CONTRAST Result Date: 01/22/2024 CLINICAL DATA:  Lower GI bleed.  Decreased blood counts. EXAM: CT ABDOMEN AND PELVIS WITH CONTRAST TECHNIQUE: Multidetector CT imaging of the abdomen and pelvis was performed using the standard protocol following bolus administration of intravenous contrast. RADIATION DOSE REDUCTION: This exam was performed according to the departmental dose-optimization program which includes automated exposure control, adjustment of the mA and/or kV according to patient size and/or use of iterative reconstruction technique. CONTRAST:  75mL OMNIPAQUE IOHEXOL 350 MG/ML SOLN COMPARISON:  Noncontrast CT 08/29/2023 FINDINGS: Lower  chest: Small bilateral pleural effusions, unchanged from prior exam on the right, new from prior exam on the left. Bilateral breast implants. Hepatobiliary: No focal liver abnormality. Gallbladder physiologically distended, no calcified stone. No biliary dilatation. Pancreas: Atrophic.  No ductal dilatation or inflammation. Spleen: Normal in size without focal abnormality. Adrenals/Urinary Tract: Normal adrenal glands. Left nephroureteral stent in place with proximal pigtail in the upper kidney and distal pigtail in the pelvis, partially obscured by streak artifact. There is similar left hydronephrosis to prior exam. Multiple left intrarenal calculi, largest in the region of the renal pelvis measuring 15 mm. No stones in the ureter along the course of the stent. There is no right hydronephrosis or renal calculi. No perinephric inflammation. Urinary bladder is partially obscured by streak artifact from right hip arthroplasty. Stomach/Bowel: Bowel assessment is limited in the absence of enteric contrast and paucity of intra-abdominal fat. The stomach is decompressed. There is suggestion of wall thickening involving proximal jejunal small bowel, for example series 4, image 35 and series 7, image 44. No associated perienteric fat stranding or inflammation. No other small bowel wall thickening. Pelvic bowel  loops are pars normal appendix is potentially visualized. Moderate volume of colonic stool. There is no obvious colonic wall thickening or colonic mass. No definite diverticular changes. Vascular/Lymphatic: Aortic atherosclerosis without aneurysm. The portal vein is patent. No enlarged lymph nodes. Reproductive: Obscured by streak artifact from right hip arthroplasty. No obvious adnexal mass. Other: No ascites.  No free air.  Mild generalized body wall edema. Musculoskeletal: Right hip arthroplasty. Scoliosis and degenerative change in the lumbar spine. There are no acute or suspicious osseous abnormalities.  IMPRESSION: 1. Bowel assessment is limited in the absence of enteric contrast and paucity of intra-abdominal fat. There is suggestion of wall thickening involving proximal jejunal small bowel. No adjacent wall thickening to suggest enteritis. 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 2. No colonic inflammation or evidence of colonic mass, although colon in the pelvis is partially obscured by streak artifact from right hip arthroplasty. 3. Left nephroureteral stent in place with similar left hydronephrosis to prior exam. Multiple left intrarenal calculi, largest in the region of the renal pelvis measuring 15 mm. 4. Small bilateral pleural effusions, unchanged from prior exam on the right, new from prior exam on the left. Aortic Atherosclerosis (ICD10-I70.0). Electronically Signed   By: Narda Rutherford M.D.   On: 01/22/2024 19:20   DG Chest 2 View Result Date: 01/22/2024 CLINICAL DATA:  Chest pain, hypertension, nonsmoker EXAM: CHEST - 2 VIEW COMPARISON:  10/16/2023 FINDINGS: Small bilateral pleural effusions, increased on the right compared to 10/16/2023. Fluid tracks into the right minor fissure. Hazy opacity in the right costophrenic angle is likely projectional artifact. No focal consolidation or pneumothorax. Stable cardiomediastinal silhouette. No displaced rib fracture. IMPRESSION: Increased small right and similar small left pleural effusions compared to 10/16/2023. Electronically Signed   By: Minerva Fester M.D.   On: 01/22/2024 16:13      LOS: 2 days    Jacquelin Hawking, MD [facility] 01/24/2024, 7:31 AM   If 7PM-7AM, please contact night-coverage [email or web address]

## 2024-01-24 NOTE — Progress Notes (Signed)
Mobility Specialist: Progress Note   01/24/24 1550  Mobility  Activity Ambulated with assistance in hallway  Level of Assistance Contact guard assist, steadying assist  Assistive Device Front wheel walker  Distance Ambulated (ft) 50 ft  Activity Response Tolerated well  Mobility Referral Yes  Mobility visit 1 Mobility  Mobility Specialist Start Time (ACUTE ONLY) 1425  Mobility Specialist Stop Time (ACUTE ONLY) 1445  Mobility Specialist Time Calculation (min) (ACUTE ONLY) 20 min    Pt was agreeable to mobility session - received in bed. Just c/o being tired. Also having some pain in her L arm/shoulder when she reaches back. MinA for bed mobility to assist with trunk elevation and scooting to EOB. MinA for STS, MinG for ambulation. Required max cues for following directions, hand placement, steering RW, and extending her knees fully during ambulation. Returned to room without fault. Having a hard time grabbing onto bed rail behind her to sit. Left in bed with all needs met, call bell in reach.   Maurene Capes Mobility Specialist Please contact via SecureChat or Rehab office at [phone]

## 2024-01-24 NOTE — H&P (View-Only) (Signed)
 [facility] Progress Note  CC:  Acute on chronic anemia, abnormal CT abdomen and pelvis   Subjective:  No new complaints.  Lying comfortably in bed with daughter at bedside.  Patient conversational but unable to provide much additional history or review of systems due to her dementia. Objective:  Vital signs in last 24 hours: Temp:  [97.7 F (36.5 C)-98.7 F (37.1 C)] 98.5 F (36.9 C) (01/30 0826) Pulse Rate:  [70-81] 77 (01/30 0826) Resp:  [16] 16 (01/30 0826) BP: (111-183)/(74-90) 170/83 (01/30 0826) SpO2:  [95 %-100 %] 100 % (01/30 0826) Weight:  [61.7 kg] 61.7 kg (01/30 0704)   General:  Alert, chronically ill-appearing, in NAD Heart:  Regular rate and rhythm; no murmurs Pulm:  CTAB.  No W/R/R. Abdomen:  Soft, non-distended.  BS present.  Non-tender. Extremities:  Without edema. Neurologic:  Alert and oriented x 4;  grossly normal neurologically. Psych:  Alert and cooperative. Normal mood and affect.  Intake/Output this shift: Total I/O In: 236 [P.O.:236] Out: -   Lab Results: Recent Labs    01/22/24 1610 01/23/24 0543 01/24/24 0721  WBC 6.5  --  7.1  HGB 6.6* 8.1* 7.7*  HCT 23.2* 25.7* 25.7*  PLT 342  --  300   BMET Recent Labs    01/22/24 1610 01/24/24 0721  NA 134* 136  K 4.3 4.2  CL 103 105  CO2 21* 21*  GLUCOSE 83 91  BUN 21 18  CREATININE 1.19* 0.98  CALCIUM 8.3* 8.6*   VAS [place] LOWER EXTREMITY VENOUS (DVT) Result Date: 01/23/2024  Lower Venous DVT Study Patient Name:  LAJADA JANES Mattix  Date of Exam:   01/23/2024 Medical Rec #: [id number]      Accession #:    [id number] Date of Birth: Nov 07, 1946      Patient Gender: F Patient Age:   78 years Exam Location:  [facility] Procedure:      VAS [place] LOWER EXTREMITY VENOUS (DVT) Referring Phys: Enid Derry HALL --------------------------------------------------------------------------------  Indications: Edema.  Anticoagulation: Eliquis (noncompliant). Limitations: Patient unable to  cooperate. Comparison Study: Previous exam on 07/26/2023 was negative for DVT Performing Technologist: Ernestene Mention RVT, RDMS  Examination Guidelines: A complete evaluation includes B-mode imaging, spectral Doppler, color Doppler, and power Doppler as needed of all accessible portions of each vessel. Bilateral testing is considered an integral part of a complete examination. Limited examinations for reoccurring indications may be performed as noted. The reflux portion of the exam is performed with the patient in reverse Trendelenburg.  +---------+---------------+---------+-----------+----------+--------------+ RIGHT    CompressibilityPhasicitySpontaneityPropertiesThrombus Aging +---------+---------------+---------+-----------+----------+--------------+ CFV      Full           Yes      Yes                                 +---------+---------------+---------+-----------+----------+--------------+ SFJ      Full                                                        +---------+---------------+---------+-----------+----------+--------------+ FV Prox  Full           Yes      Yes                                 +---------+---------------+---------+-----------+----------+--------------+  FV Mid   Full           Yes      Yes                                 +---------+---------------+---------+-----------+----------+--------------+ FV DistalFull           Yes      Yes                                 +---------+---------------+---------+-----------+----------+--------------+ PFV      Full                                                        +---------+---------------+---------+-----------+----------+--------------+ POP      Full           Yes      Yes                                 +---------+---------------+---------+-----------+----------+--------------+ PTV      Full                                                         +---------+---------------+---------+-----------+----------+--------------+ PERO     Full                                                        +---------+---------------+---------+-----------+----------+--------------+   +---------+---------------+---------+-----------+----------+-------------------+ LEFT     CompressibilityPhasicitySpontaneityPropertiesThrombus Aging      +---------+---------------+---------+-----------+----------+-------------------+ CFV      Full                                                             +---------+---------------+---------+-----------+----------+-------------------+ SFJ      Full                                                             +---------+---------------+---------+-----------+----------+-------------------+ FV Prox  Full                                                             +---------+---------------+---------+-----------+----------+-------------------+ FV Mid   Full                                                             +---------+---------------+---------+-----------+----------+-------------------+  FV DistalFull                                                             +---------+---------------+---------+-----------+----------+-------------------+ PFV      Full                                                             +---------+---------------+---------+-----------+----------+-------------------+ POP      Full                                                             +---------+---------------+---------+-----------+----------+-------------------+ PTV      Full                                                             +---------+---------------+---------+-----------+----------+-------------------+ PERO     Full                                         Not well visualized +---------+---------------+---------+-----------+----------+-------------------+     Summary:  BILATERAL: - No evidence of deep vein thrombosis seen in the lower extremities, bilaterally. - RIGHT: - No cystic structure found in the popliteal fossa.  LEFT: - A heterogenous cystic structure is found in the popliteal fossa (4.0 x 2.33 x 3.97 cm). Subcutaneous edema of calf and ankle.  *See table(s) above for measurements and observations. Electronically signed by Heath Lark on 01/23/2024 at 4:15:49 PM.    Final    ECHOCARDIOGRAM COMPLETE Result Date: 01/23/2024    ECHOCARDIOGRAM REPORT   Patient Name:   TIANNAH GREENLY Hardcastle Date of Exam: 01/23/2024 Medical Rec #:  [id number]     Height:       70.0 in Accession #:    [id number]    Weight:       113.1 lb Date of Birth:  [birth year]     BSA:          1.638 m Patient Age:    78 years      BP:           168/82 mmHg Patient Gender: F             HR:           75 bpm. Exam Location:  Inpatient Procedure: 2D Echo, Cardiac Doppler, Color Doppler and Intracardiac            Opacification Agent Indications:    CHF  History:        Patient has prior history of Echocardiogram examinations, most                 recent 07/26/2023. Mitral Valve Disease,  Signs/Symptoms:Chest                 Pain; Risk Factors:Hypertension and HLD.  Sonographer:    Webb Laws Referring Phys: [id number] CAROLE N HALL IMPRESSIONS  1. Left ventricular ejection fraction, by estimation, is 55 to 60%. The left ventricle has normal function. The left ventricle has no regional wall motion abnormalities. There is moderate concentric left ventricular hypertrophy of the basal-septal segment. Left ventricular diastolic parameters are consistent with Grade II diastolic dysfunction (pseudonormalization).  2. Right ventricular systolic function is normal. The right ventricular size is normal. There is moderately elevated pulmonary artery systolic pressure. The estimated right ventricular systolic pressure is 53.4 mmHg.  3. The mitral valve is normal in structure. Trivial mitral valve regurgitation. No evidence of  mitral stenosis.  4. The aortic valve is normal in structure. Aortic valve regurgitation is not visualized. No aortic stenosis is present.  5. The inferior vena cava is normal in size with greater than 50% respiratory variability, suggesting right atrial pressure of 3 mmHg. FINDINGS  Left Ventricle: Left ventricular ejection fraction, by estimation, is 55 to 60%. The left ventricle has normal function. The left ventricle has no regional wall motion abnormalities. The left ventricular internal cavity size was normal in size. There is  moderate concentric left ventricular hypertrophy of the basal-septal segment. Left ventricular diastolic parameters are consistent with Grade II diastolic dysfunction (pseudonormalization). Right Ventricle: The right ventricular size is normal. No increase in right ventricular wall thickness. Right ventricular systolic function is normal. There is moderately elevated pulmonary artery systolic pressure. The tricuspid regurgitant velocity is 3.55 m/s, and with an assumed right atrial pressure of 3 mmHg, the estimated right ventricular systolic pressure is 53.4 mmHg. Left Atrium: Left atrial size was normal in size. Right Atrium: Right atrial size was normal in size. Pericardium: There is no evidence of pericardial effusion. Mitral Valve: The mitral valve is normal in structure. There is mild thickening of the mitral valve leaflet(s). Trivial mitral valve regurgitation. No evidence of mitral valve stenosis. Tricuspid Valve: The tricuspid valve is normal in structure. Tricuspid valve regurgitation is not demonstrated. No evidence of tricuspid stenosis. Aortic Valve: The aortic valve is normal in structure. Aortic valve regurgitation is not visualized. No aortic stenosis is present. Pulmonic Valve: The pulmonic valve was normal in structure. Pulmonic valve regurgitation is trivial. No evidence of pulmonic stenosis. Aorta: The aortic root is normal in size and structure. Venous: The inferior  vena cava is normal in size with greater than 50% respiratory variability, suggesting right atrial pressure of 3 mmHg. IAS/Shunts: No atrial level shunt detected by color flow Doppler.  LEFT VENTRICLE PLAX 2D LVIDd:         3.30 cm      Diastology LVIDs:         2.60 cm      LV e' medial:    4.68 cm/s LV PW:         1.20 cm      LV E/e' medial:  33.8 LV IVS:        1.40 cm      LV e' lateral:   7.18 cm/s LVOT diam:     1.90 cm      LV E/e' lateral: 22.0 LV SV:         49 LV SV Index:   30 LVOT Area:     2.84 cm  LV Volumes (MOD) LV vol d, MOD A2C: 70.9 ml LV vol d,  MOD A4C: 105.0 ml LV vol s, MOD A2C: 34.6 ml LV vol s, MOD A4C: 36.9 ml LV SV MOD A2C:     36.3 ml LV SV MOD A4C:     105.0 ml LV SV MOD BP:      52.1 ml RIGHT VENTRICLE             IVC RV Basal diam:  4.00 cm     IVC diam: 1.80 cm RV S prime:     12.10 cm/s TAPSE (M-mode): 2.2 cm LEFT ATRIUM             Index        RIGHT ATRIUM           Index LA diam:        3.50 cm 2.14 cm/m   RA Area:     14.90 cm LA Vol (A2C):   45.4 ml 27.71 ml/m  RA Volume:   39.10 ml  23.87 ml/m LA Vol (A4C):   45.6 ml 27.84 ml/m LA Biplane Vol: 46.8 ml 28.57 ml/m  AORTIC VALVE             PULMONIC VALVE LVOT Vmax:   86.40 cm/s  PR End Diast Vel: 1.91 msec LVOT Vmean:  61.300 cm/s LVOT VTI:    0.174 m  AORTA Ao Root diam: 2.60 cm Ao Asc diam:  3.10 cm MITRAL VALVE                TRICUSPID VALVE MV Area (PHT): 4.39 cm     TR Peak grad:   50.4 mmHg MV Decel Time: 173 msec     TR Vmax:        355.00 cm/s MV E velocity: 158.00 cm/s MV A velocity: 114.00 cm/s  SHUNTS MV E/A ratio:  1.39         Systemic VTI:  0.17 m                             Systemic Diam: 1.90 cm Aditya Sabharwal Electronically signed by Dorthula Nettles Signature Date/Time: 01/23/2024/11:58:31 AM    Final    CT ABDOMEN PELVIS W CONTRAST Result Date: 01/22/2024 CLINICAL DATA:  Lower GI bleed.  Decreased blood counts. EXAM: CT ABDOMEN AND PELVIS WITH CONTRAST TECHNIQUE: Multidetector CT imaging of the  abdomen and pelvis was performed using the standard protocol following bolus administration of intravenous contrast. RADIATION DOSE REDUCTION: This exam was performed according to the departmental dose-optimization program which includes automated exposure control, adjustment of the mA and/or kV according to patient size and/or use of iterative reconstruction technique. CONTRAST:  75mL OMNIPAQUE IOHEXOL 350 MG/ML SOLN COMPARISON:  Noncontrast CT 08/29/2023 FINDINGS: Lower chest: Small bilateral pleural effusions, unchanged from prior exam on the right, new from prior exam on the left. Bilateral breast implants. Hepatobiliary: No focal liver abnormality. Gallbladder physiologically distended, no calcified stone. No biliary dilatation. Pancreas: Atrophic.  No ductal dilatation or inflammation. Spleen: Normal in size without focal abnormality. Adrenals/Urinary Tract: Normal adrenal glands. Left nephroureteral stent in place with proximal pigtail in the upper kidney and distal pigtail in the pelvis, partially obscured by streak artifact. There is similar left hydronephrosis to prior exam. Multiple left intrarenal calculi, largest in the region of the renal pelvis measuring 15 mm. No stones in the ureter along the course of the stent. There is no right hydronephrosis or renal calculi. No perinephric inflammation. Urinary bladder is partially obscured by  streak artifact from right hip arthroplasty. Stomach/Bowel: Bowel assessment is limited in the absence of enteric contrast and paucity of intra-abdominal fat. The stomach is decompressed. There is suggestion of wall thickening involving proximal jejunal small bowel, for example series 4, image 35 and series 7, image 44. No associated perienteric fat stranding or inflammation. No other small bowel wall thickening. Pelvic bowel loops are pars normal appendix is potentially visualized. Moderate volume of colonic stool. There is no obvious colonic wall thickening or colonic  mass. No definite diverticular changes. Vascular/Lymphatic: Aortic atherosclerosis without aneurysm. The portal vein is patent. No enlarged lymph nodes. Reproductive: Obscured by streak artifact from right hip arthroplasty. No obvious adnexal mass. Other: No ascites.  No free air.  Mild generalized body wall edema. Musculoskeletal: Right hip arthroplasty. Scoliosis and degenerative change in the lumbar spine. There are no acute or suspicious osseous abnormalities. IMPRESSION: 1. Bowel assessment is limited in the absence of enteric contrast and paucity of intra-abdominal fat. There is suggestion of wall thickening involving proximal jejunal small bowel. No adjacent wall thickening to suggest enteritis. 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 2. No colonic inflammation or evidence of colonic mass, although colon in the pelvis is partially obscured by streak artifact from right hip arthroplasty. 3. Left nephroureteral stent in place with similar left hydronephrosis to prior exam. Multiple left intrarenal calculi, largest in the region of the renal pelvis measuring 15 mm. 4. Small bilateral pleural effusions, unchanged from prior exam on the right, new from prior exam on the left. Aortic Atherosclerosis (ICD10-I70.0). Electronically Signed   By: Narda Rutherford M.D.   On: 01/22/2024 19:20   DG Chest 2 View Result Date: 01/22/2024 CLINICAL DATA:  Chest pain, hypertension, nonsmoker EXAM: CHEST - 2 VIEW COMPARISON:  10/16/2023 FINDINGS: Small bilateral pleural effusions, increased on the right compared to 10/16/2023. Fluid tracks into the right minor fissure. Hazy opacity in the right costophrenic angle is likely projectional artifact. No focal consolidation or pneumothorax. Stable cardiomediastinal silhouette. No displaced rib fracture. IMPRESSION: Increased small right and similar small left pleural effusions compared to 10/16/2023. Electronically Signed   By: Minerva Fester M.D.   On: 01/22/2024 16:13    Assessment / Plan: Acute on chronic anemia, has been normocytic up to this admission when it is now microcytic.  Possibly multifactorial.  Hgb was 6.6 grams and she received one unit of PRBCs which has brought her Hgb up to 7.7 grams today.   Altered character of stool, reportedly black stool last few days.  Unknown if this represents GI bleeding, as the patient is reportedly FOBT negative here.   Abnormal GI imaging-CT abdomen and pelvis done for anemia with concern for GI blood loss.  This shows some nonspecific possible thickening of the proximal jejunum.  Images reviewed, they are limited by the lack of oral contrast and difficult to say if they represent true inflammation or neoplastic process such as the lymphoma suggested by radiology.  No adjacent adenopathy or other abdominal/retroperitoneal adenopathy described.   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  Elevated BNP suggests this could be a contributing factor.  Hypoalbuminemia also contributing (last albumin on file was 2.0 in September 2024)  PAF:  On Eliquis as outpatient but nonadherent to it by notes and daughter says that she has not been on it for  probably 2 weeks.  -EGD/enteroscopy on 1/31.  I discussed with her nurse and she will obtain consent from the patient's husband when he comes back later today (the daughter says that he just went home to rest for a little while and will be back). -Monitor Hgb and transfuse further prn.    LOS: 2 days   Princella Pellegrini. Zehr  01/24/2024, 12:51 PM  I have taken an interval history, thoroughly reviewed the chart and examined the patient. I agree with the Advanced Practitioner's note, impression and recommendations, and have recorded additional findings, impressions and recommendations below. I performed  a substantive portion of this encounter (>50% time spent), including a complete performance of the medical decision making.  My additional thoughts are as follows:  Hemoglobin improved with transfusion, and still no overt GI bleeding. Patient denies abdominal pain, no vomiting has been reported, and she is tolerating regular diet without difficulty.  Echocardiogram looks good overall with some grade 2 diastolic function but normal LVEF and no significant valvular abnormalities. Left Baker's cyst but no DVT  Plan for small bowel enteroscopy tomorrow.   [name] Office:[phone]

## 2024-01-24 NOTE — Progress Notes (Addendum)
[facility] Progress Note  CC:  Acute on chronic anemia, abnormal CT abdomen and pelvis   Subjective:  No new complaints.  Lying comfortably in bed with daughter at bedside.  Patient conversational but unable to provide much additional history or review of systems due to her dementia. Objective:  Vital signs in last 24 hours: Temp:  [97.7 F (36.5 C)-98.7 F (37.1 C)] 98.5 F (36.9 C) (01/30 0826) Pulse Rate:  [70-81] 77 (01/30 0826) Resp:  [16] 16 (01/30 0826) BP: (111-183)/(74-90) 170/83 (01/30 0826) SpO2:  [95 %-100 %] 100 % (01/30 0826) Weight:  [61.7 kg] 61.7 kg (01/30 0704)   General:  Alert, chronically ill-appearing, in NAD Heart:  Regular rate and rhythm; no murmurs Pulm:  CTAB.  No W/R/R. Abdomen:  Soft, non-distended.  BS present.  Non-tender. Extremities:  Without edema. Neurologic:  Alert and oriented x 4;  grossly normal neurologically. Psych:  Alert and cooperative. Normal mood and affect.  Intake/Output this shift: Total I/O In: 236 [P.O.:236] Out: -   Lab Results: Recent Labs    01/22/24 1610 01/23/24 0543 01/24/24 0721  WBC 6.5  --  7.1  HGB 6.6* 8.1* 7.7*  HCT 23.2* 25.7* 25.7*  PLT 342  --  300   BMET Recent Labs    01/22/24 1610 01/24/24 0721  NA 134* 136  K 4.3 4.2  CL 103 105  CO2 21* 21*  GLUCOSE 83 91  BUN 21 18  CREATININE 1.19* 0.98  CALCIUM 8.3* 8.6*   VAS [place] LOWER EXTREMITY VENOUS (DVT) Result Date: 01/23/2024  Lower Venous DVT Study Patient Name:  Kristen Hansen  Date of Exam:   01/23/2024 Medical Rec #: [id number]      Accession #:    [id number] Date of Birth: Nov 07, 1946      Patient Gender: F Patient Age:   78 years Exam Location:  [facility] Procedure:      VAS [place] LOWER EXTREMITY VENOUS (DVT) Referring Phys: Enid Derry HALL --------------------------------------------------------------------------------  Indications: Edema.  Anticoagulation: Eliquis (noncompliant). Limitations: Patient unable to  cooperate. Comparison Study: Previous exam on 07/26/2023 was negative for DVT Performing Technologist: Ernestene Mention RVT, RDMS  Examination Guidelines: A complete evaluation includes B-mode imaging, spectral Doppler, color Doppler, and power Doppler as needed of all accessible portions of each vessel. Bilateral testing is considered an integral part of a complete examination. Limited examinations for reoccurring indications may be performed as noted. The reflux portion of the exam is performed with the patient in reverse Trendelenburg.  +---------+---------------+---------+-----------+----------+--------------+ RIGHT    CompressibilityPhasicitySpontaneityPropertiesThrombus Aging +---------+---------------+---------+-----------+----------+--------------+ CFV      Full           Yes      Yes                                 +---------+---------------+---------+-----------+----------+--------------+ SFJ      Full                                                        +---------+---------------+---------+-----------+----------+--------------+ FV Prox  Full           Yes      Yes                                 +---------+---------------+---------+-----------+----------+--------------+  FV Mid   Full           Yes      Yes                                 +---------+---------------+---------+-----------+----------+--------------+ FV DistalFull           Yes      Yes                                 +---------+---------------+---------+-----------+----------+--------------+ PFV      Full                                                        +---------+---------------+---------+-----------+----------+--------------+ POP      Full           Yes      Yes                                 +---------+---------------+---------+-----------+----------+--------------+ PTV      Full                                                         +---------+---------------+---------+-----------+----------+--------------+ PERO     Full                                                        +---------+---------------+---------+-----------+----------+--------------+   +---------+---------------+---------+-----------+----------+-------------------+ LEFT     CompressibilityPhasicitySpontaneityPropertiesThrombus Aging      +---------+---------------+---------+-----------+----------+-------------------+ CFV      Full                                                             +---------+---------------+---------+-----------+----------+-------------------+ SFJ      Full                                                             +---------+---------------+---------+-----------+----------+-------------------+ FV Prox  Full                                                             +---------+---------------+---------+-----------+----------+-------------------+ FV Mid   Full                                                             +---------+---------------+---------+-----------+----------+-------------------+  FV DistalFull                                                             +---------+---------------+---------+-----------+----------+-------------------+ PFV      Full                                                             +---------+---------------+---------+-----------+----------+-------------------+ POP      Full                                                             +---------+---------------+---------+-----------+----------+-------------------+ PTV      Full                                                             +---------+---------------+---------+-----------+----------+-------------------+ PERO     Full                                         Not well visualized +---------+---------------+---------+-----------+----------+-------------------+     Summary:  BILATERAL: - No evidence of deep vein thrombosis seen in the lower extremities, bilaterally. - RIGHT: - No cystic structure found in the popliteal fossa.  LEFT: - A heterogenous cystic structure is found in the popliteal fossa (4.0 x 2.33 x 3.97 cm). Subcutaneous edema of calf and ankle.  *See table(s) above for measurements and observations. Electronically signed by Heath Lark on 01/23/2024 at 4:15:49 PM.    Final    ECHOCARDIOGRAM COMPLETE Result Date: 01/23/2024    ECHOCARDIOGRAM REPORT   Patient Name:   Kristen Hansen Date of Exam: 01/23/2024 Medical Rec #:  [id number]     Height:       70.0 in Accession #:    [id number]    Weight:       113.1 lb Date of Birth:  [birth year]     BSA:          1.638 m Patient Age:    78 years      BP:           168/82 mmHg Patient Gender: F             HR:           75 bpm. Exam Location:  Inpatient Procedure: 2D Echo, Cardiac Doppler, Color Doppler and Intracardiac            Opacification Agent Indications:    CHF  History:        Patient has prior history of Echocardiogram examinations, most                 recent 07/26/2023. Mitral Valve Disease,  Signs/Symptoms:Chest                 Pain; Risk Factors:Hypertension and HLD.  Sonographer:    Webb Laws Referring Phys: [id number] CAROLE N HALL IMPRESSIONS  1. Left ventricular ejection fraction, by estimation, is 55 to 60%. The left ventricle has normal function. The left ventricle has no regional wall motion abnormalities. There is moderate concentric left ventricular hypertrophy of the basal-septal segment. Left ventricular diastolic parameters are consistent with Grade II diastolic dysfunction (pseudonormalization).  2. Right ventricular systolic function is normal. The right ventricular size is normal. There is moderately elevated pulmonary artery systolic pressure. The estimated right ventricular systolic pressure is 53.4 mmHg.  3. The mitral valve is normal in structure. Trivial mitral valve regurgitation. No evidence of  mitral stenosis.  4. The aortic valve is normal in structure. Aortic valve regurgitation is not visualized. No aortic stenosis is present.  5. The inferior vena cava is normal in size with greater than 50% respiratory variability, suggesting right atrial pressure of 3 mmHg. FINDINGS  Left Ventricle: Left ventricular ejection fraction, by estimation, is 55 to 60%. The left ventricle has normal function. The left ventricle has no regional wall motion abnormalities. The left ventricular internal cavity size was normal in size. There is  moderate concentric left ventricular hypertrophy of the basal-septal segment. Left ventricular diastolic parameters are consistent with Grade II diastolic dysfunction (pseudonormalization). Right Ventricle: The right ventricular size is normal. No increase in right ventricular wall thickness. Right ventricular systolic function is normal. There is moderately elevated pulmonary artery systolic pressure. The tricuspid regurgitant velocity is 3.55 m/s, and with an assumed right atrial pressure of 3 mmHg, the estimated right ventricular systolic pressure is 53.4 mmHg. Left Atrium: Left atrial size was normal in size. Right Atrium: Right atrial size was normal in size. Pericardium: There is no evidence of pericardial effusion. Mitral Valve: The mitral valve is normal in structure. There is mild thickening of the mitral valve leaflet(s). Trivial mitral valve regurgitation. No evidence of mitral valve stenosis. Tricuspid Valve: The tricuspid valve is normal in structure. Tricuspid valve regurgitation is not demonstrated. No evidence of tricuspid stenosis. Aortic Valve: The aortic valve is normal in structure. Aortic valve regurgitation is not visualized. No aortic stenosis is present. Pulmonic Valve: The pulmonic valve was normal in structure. Pulmonic valve regurgitation is trivial. No evidence of pulmonic stenosis. Aorta: The aortic root is normal in size and structure. Venous: The inferior  vena cava is normal in size with greater than 50% respiratory variability, suggesting right atrial pressure of 3 mmHg. IAS/Shunts: No atrial level shunt detected by color flow Doppler.  LEFT VENTRICLE PLAX 2D LVIDd:         3.30 cm      Diastology LVIDs:         2.60 cm      LV e' medial:    4.68 cm/s LV PW:         1.20 cm      LV E/e' medial:  33.8 LV IVS:        1.40 cm      LV e' lateral:   7.18 cm/s LVOT diam:     1.90 cm      LV E/e' lateral: 22.0 LV SV:         49 LV SV Index:   30 LVOT Area:     2.84 cm  LV Volumes (MOD) LV vol d, MOD A2C: 70.9 ml LV vol d,  MOD A4C: 105.0 ml LV vol s, MOD A2C: 34.6 ml LV vol s, MOD A4C: 36.9 ml LV SV MOD A2C:     36.3 ml LV SV MOD A4C:     105.0 ml LV SV MOD BP:      52.1 ml RIGHT VENTRICLE             IVC RV Basal diam:  4.00 cm     IVC diam: 1.80 cm RV S prime:     12.10 cm/s TAPSE (M-mode): 2.2 cm LEFT ATRIUM             Index        RIGHT ATRIUM           Index LA diam:        3.50 cm 2.14 cm/m   RA Area:     14.90 cm LA Vol (A2C):   45.4 ml 27.71 ml/m  RA Volume:   39.10 ml  23.87 ml/m LA Vol (A4C):   45.6 ml 27.84 ml/m LA Biplane Vol: 46.8 ml 28.57 ml/m  AORTIC VALVE             PULMONIC VALVE LVOT Vmax:   86.40 cm/s  PR End Diast Vel: 1.91 msec LVOT Vmean:  61.300 cm/s LVOT VTI:    0.174 m  AORTA Ao Root diam: 2.60 cm Ao Asc diam:  3.10 cm MITRAL VALVE                TRICUSPID VALVE MV Area (PHT): 4.39 cm     TR Peak grad:   50.4 mmHg MV Decel Time: 173 msec     TR Vmax:        355.00 cm/s MV E velocity: 158.00 cm/s MV A velocity: 114.00 cm/s  SHUNTS MV E/A ratio:  1.39         Systemic VTI:  0.17 m                             Systemic Diam: 1.90 cm Aditya Sabharwal Electronically signed by Dorthula Nettles Signature Date/Time: 01/23/2024/11:58:31 AM    Final    CT ABDOMEN PELVIS W CONTRAST Result Date: 01/22/2024 CLINICAL DATA:  Lower GI bleed.  Decreased blood counts. EXAM: CT ABDOMEN AND PELVIS WITH CONTRAST TECHNIQUE: Multidetector CT imaging of the  abdomen and pelvis was performed using the standard protocol following bolus administration of intravenous contrast. RADIATION DOSE REDUCTION: This exam was performed according to the departmental dose-optimization program which includes automated exposure control, adjustment of the mA and/or kV according to patient size and/or use of iterative reconstruction technique. CONTRAST:  75mL OMNIPAQUE IOHEXOL 350 MG/ML SOLN COMPARISON:  Noncontrast CT 08/29/2023 FINDINGS: Lower chest: Small bilateral pleural effusions, unchanged from prior exam on the right, new from prior exam on the left. Bilateral breast implants. Hepatobiliary: No focal liver abnormality. Gallbladder physiologically distended, no calcified stone. No biliary dilatation. Pancreas: Atrophic.  No ductal dilatation or inflammation. Spleen: Normal in size without focal abnormality. Adrenals/Urinary Tract: Normal adrenal glands. Left nephroureteral stent in place with proximal pigtail in the upper kidney and distal pigtail in the pelvis, partially obscured by streak artifact. There is similar left hydronephrosis to prior exam. Multiple left intrarenal calculi, largest in the region of the renal pelvis measuring 15 mm. No stones in the ureter along the course of the stent. There is no right hydronephrosis or renal calculi. No perinephric inflammation. Urinary bladder is partially obscured by  streak artifact from right hip arthroplasty. Stomach/Bowel: Bowel assessment is limited in the absence of enteric contrast and paucity of intra-abdominal fat. The stomach is decompressed. There is suggestion of wall thickening involving proximal jejunal small bowel, for example series 4, image 35 and series 7, image 44. No associated perienteric fat stranding or inflammation. No other small bowel wall thickening. Pelvic bowel loops are pars normal appendix is potentially visualized. Moderate volume of colonic stool. There is no obvious colonic wall thickening or colonic  mass. No definite diverticular changes. Vascular/Lymphatic: Aortic atherosclerosis without aneurysm. The portal vein is patent. No enlarged lymph nodes. Reproductive: Obscured by streak artifact from right hip arthroplasty. No obvious adnexal mass. Other: No ascites.  No free air.  Mild generalized body wall edema. Musculoskeletal: Right hip arthroplasty. Scoliosis and degenerative change in the lumbar spine. There are no acute or suspicious osseous abnormalities. IMPRESSION: 1. Bowel assessment is limited in the absence of enteric contrast and paucity of intra-abdominal fat. There is suggestion of wall thickening involving proximal jejunal small bowel. No adjacent wall thickening to suggest enteritis. 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 2. No colonic inflammation or evidence of colonic mass, although colon in the pelvis is partially obscured by streak artifact from right hip arthroplasty. 3. Left nephroureteral stent in place with similar left hydronephrosis to prior exam. Multiple left intrarenal calculi, largest in the region of the renal pelvis measuring 15 mm. 4. Small bilateral pleural effusions, unchanged from prior exam on the right, new from prior exam on the left. Aortic Atherosclerosis (ICD10-I70.0). Electronically Signed   By: Narda Rutherford M.D.   On: 01/22/2024 19:20   DG Chest 2 View Result Date: 01/22/2024 CLINICAL DATA:  Chest pain, hypertension, nonsmoker EXAM: CHEST - 2 VIEW COMPARISON:  10/16/2023 FINDINGS: Small bilateral pleural effusions, increased on the right compared to 10/16/2023. Fluid tracks into the right minor fissure. Hazy opacity in the right costophrenic angle is likely projectional artifact. No focal consolidation or pneumothorax. Stable cardiomediastinal silhouette. No displaced rib fracture. IMPRESSION: Increased small right and similar small left pleural effusions compared to 10/16/2023. Electronically Signed   By: Minerva Fester M.D.   On: 01/22/2024 16:13    Assessment / Plan: Acute on chronic anemia, has been normocytic up to this admission when it is now microcytic.  Possibly multifactorial.  Hgb was 6.6 grams and she received one unit of PRBCs which has brought her Hgb up to 7.7 grams today.   Altered character of stool, reportedly black stool last few days.  Unknown if this represents GI bleeding, as the patient is reportedly FOBT negative here.   Abnormal GI imaging-CT abdomen and pelvis done for anemia with concern for GI blood loss.  This shows some nonspecific possible thickening of the proximal jejunum.  Images reviewed, they are limited by the lack of oral contrast and difficult to say if they represent true inflammation or neoplastic process such as the lymphoma suggested by radiology.  No adjacent adenopathy or other abdominal/retroperitoneal adenopathy described.   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  Elevated BNP suggests this could be a contributing factor.  Hypoalbuminemia also contributing (last albumin on file was 2.0 in September 2024)  PAF:  On Eliquis as outpatient but nonadherent to it by notes and daughter says that she has not been on it for  probably 2 weeks.  -EGD/enteroscopy on 1/31.  I discussed with her nurse and she will obtain consent from the patient's husband when he comes back later today (the daughter says that he just went home to rest for a little while and will be back). -Monitor Hgb and transfuse further prn.    LOS: 2 days   Princella Pellegrini. Zehr  01/24/2024, 12:51 PM  I have taken an interval history, thoroughly reviewed the chart and examined the patient. I agree with the Advanced Practitioner's note, impression and recommendations, and have recorded additional findings, impressions and recommendations below. I performed  a substantive portion of this encounter (>50% time spent), including a complete performance of the medical decision making.  My additional thoughts are as follows:  Hemoglobin improved with transfusion, and still no overt GI bleeding. Patient denies abdominal pain, no vomiting has been reported, and she is tolerating regular diet without difficulty.  Echocardiogram looks good overall with some grade 2 diastolic function but normal LVEF and no significant valvular abnormalities. Left Baker's cyst but no DVT  Plan for small bowel enteroscopy tomorrow.   [name] Office:[phone]

## 2024-01-24 NOTE — Evaluation (Signed)
Physical Therapy Evaluation Patient Details Name: Kristen Hansen MRN: [id number] DOB: 11/10/1946 Today's Date: 01/24/2024  History of Present Illness  78 y/o F presenting to ED on 1/28 with low hgb (6.6) after being seen at rheumatologist, also with SOB/CP and black stools. CT imaging with swelling in duodenum, possibly consistent with lymphoma. Admitted for symptomatic anemia. BLE ultrasound negative for DVT. PMH includes dementia, sjogren's disease, HTN, hyperthyroidism on methimazole, dementia, chronic HFpEF, paroxysmal A fib on Eliquis with noncompliance, pericarditis, trigeminal neuralgia, anxiety, depression, REM behavior disorder  Clinical Impression  Pt admitted with above diagnosis. Mod I PTA with rollator, husband reports decline in function over the past week. States she is moving much better today, getting closer to baseline. Able to ambulate with CGA  70 feet, using RW for support. Min assist for bed mobility and light assist to stand with transfer. Husband present and very supportive. Feels confident with his ability to care for her at d/c based on current level of mobility. Will follow and progress as tolerated during admission. All equipment needs in place already at home. They are agreeable to HHPT follow-up to assist with return to PLOF and increase independence/safety with functional mobility. Pt currently with functional limitations due to the deficits listed below (see PT Problem List). Pt will benefit from acute skilled PT to increase their independence and safety with mobility to allow discharge.           If plan is discharge home, recommend the following: A little help with walking and/or transfers;A little help with bathing/dressing/bathroom;Assistance with cooking/housework;Assist for transportation;Help with stairs or ramp for entrance   Can travel by private vehicle        Equipment Recommendations None recommended by PT  Recommendations for Other Services        Functional Status Assessment Patient has had a recent decline in their functional status and demonstrates the ability to make significant improvements in function in a reasonable and predictable amount of time.     Precautions / Restrictions Precautions Precautions: Fall Restrictions Weight Bearing Restrictions Per Provider Order: No      Mobility  Bed Mobility Overal bed mobility: Needs Assistance Bed Mobility: Supine to Sit, Sit to Supine     Supine to sit: Min assist Sit to supine: Supervision   General bed mobility comments: min assist to sequence and support trunk to EOB. Supervision returning to supine. Reviewed techniques with husband.    Transfers Overall transfer level: Needs assistance Equipment used: Rolling walker (2 wheels) Transfers: Sit to/from Stand Sit to Stand: Min assist           General transfer comment: Light min assist to facilitate boost into standing position from bed. Slight posterior lean and narrow BOS. Able to correct with cues.    Ambulation/Gait Ambulation/Gait assistance: Contact guard assist Gait Distance (Feet): 70 Feet Assistive device: Rolling walker (2 wheels) Gait Pattern/deviations: Step-through pattern, Decreased stride length, Knee flexed in stance - right, Knee flexed in stance - left, Trunk flexed, Antalgic Gait velocity: dec Gait velocity interpretation: <1.31 ft/sec, indicative of household ambulator   General Gait Details: Slower, guarded, fair reliance on RW for support but no overt buckling. Able to turn with good proximity and control of device. Cues for gait symmetry, safety awareness. CGA for safety.  Stairs            Wheelchair Mobility     Tilt Bed    Modified Rankin (Stroke Patients Only)  Balance Overall balance assessment: Needs assistance Sitting-balance support: Feet supported Sitting balance-Leahy Scale: Fair     Standing balance support: During functional activity, No upper  extremity supported Standing balance-Leahy Scale: Poor Standing balance comment: reliant on RW in standing                             Pertinent Vitals/Pain Pain Assessment Pain Assessment: No/denies pain Pain Intervention(s): Monitored during session    Home Living Family/patient expects to be discharged to:: Private residence Living Arrangements: Spouse/significant other Available Help at Discharge: Family;Available 24 hours/day Type of Home: House Home Access: Stairs to enter Entrance Stairs-Rails: None Entrance Stairs-Number of Steps: 2   Home Layout: Able to live on main level with bedroom/bathroom;Two level Home Equipment: Agricultural consultant (2 wheels);Rollator (4 wheels);Cane - single point;BSC/3in1;Cane - quad;Wheelchair - manual;Tub bench;Lift chair      Prior Function Prior Level of Function : Needs assist             Mobility Comments: using rollator up until a week ago, fatigues quickly throughout the day ADLs Comments: ind, PRN assist for ADLs overall     Extremity/Trunk Assessment   Upper Extremity Assessment Upper Extremity Assessment: Defer to OT evaluation    Lower Extremity Assessment Lower Extremity Assessment: Generalized weakness (Reports hx of hip/knee issues, no new issues)    Cervical / Trunk Assessment Cervical / Trunk Assessment: Normal  Communication   Communication Communication: No apparent difficulties  Cognition Arousal: Alert Behavior During Therapy: WFL for tasks assessed/performed Overall Cognitive Status: History of cognitive impairments - at baseline                                 General Comments: hx of dementia        General Comments      Exercises General Exercises - Lower Extremity Ankle Circles/Pumps: AROM, Both, 15 reps, Seated Quad Sets: Strengthening, Both, 10 reps, Supine Long Arc Quad: Strengthening, Both, 20 reps, Seated Heel Slides: Strengthening, Both, 10 reps, Supine    Assessment/Plan    PT Assessment Patient needs continued PT services  PT Problem List Decreased strength;Decreased range of motion;Decreased activity tolerance;Decreased balance;Decreased mobility;Decreased cognition;Decreased knowledge of use of DME       PT Treatment Interventions DME instruction;Gait training;Stair training;Functional mobility training;Therapeutic activities;Therapeutic exercise;Balance training;Neuromuscular re-education;Patient/family education    PT Goals (Current goals can be found in the Care Plan section)  Acute Rehab PT Goals Patient Stated Goal: Go home PT Goal Formulation: With patient/family Time For Goal Achievement: 02/07/24 Potential to Achieve Goals: Good    Frequency Min 1X/week     Co-evaluation               AM-PAC PT "6 Clicks" Mobility  Outcome Measure Help needed turning from your back to your side while in a flat bed without using bedrails?: A Little Help needed moving from lying on your back to sitting on the side of a flat bed without using bedrails?: A Little Help needed moving to and from a bed to a chair (including a wheelchair)?: A Little Help needed standing up from a chair using your arms (e.g., wheelchair or bedside chair)?: A Little Help needed to walk in hospital room?: A Little Help needed climbing 3-5 steps with a railing? : A Little 6 Click Score: 18    End of Session Equipment Utilized During Treatment:  Gait belt Activity Tolerance: Patient tolerated treatment well Patient left: in bed;with call bell/phone within reach;with bed alarm set;with family/visitor present   PT Visit Diagnosis: Unsteadiness on feet (R26.81);Other abnormalities of gait and mobility (R26.89);Muscle weakness (generalized) (M62.81)    Time: 7425-9563 PT Time Calculation (min) (ACUTE ONLY): 22 min   Charges:   PT Evaluation $PT Eval Low Complexity: 1 Low   PT General Charges $$ ACUTE PT VISIT: 1 Visit         Kathlyn Sacramento, PT,  DPT [facility]  Rehabilitation Services Physical Therapist Office: [phone] Website: [email or web address]   Berton Mount 01/24/2024, 11:41 AM

## 2024-01-25 ENCOUNTER — Encounter (HOSPITAL_COMMUNITY): Payer: Self-pay | Admitting: Internal Medicine

## 2024-01-25 ENCOUNTER — Inpatient Hospital Stay (HOSPITAL_COMMUNITY): Payer: Medicare PPO | Admitting: Anesthesiology

## 2024-01-25 ENCOUNTER — Encounter (HOSPITAL_COMMUNITY)
Admission: EM | Disposition: A | Discharge status: Home Health | Payer: Self-pay | Source: Home / Self Care | Attending: Family Medicine

## 2024-01-25 DIAGNOSIS — I480 Paroxysmal atrial fibrillation: Secondary | ICD-10-CM | POA: Diagnosis not present

## 2024-01-25 DIAGNOSIS — D509 Iron deficiency anemia, unspecified: Secondary | ICD-10-CM | POA: Diagnosis not present

## 2024-01-25 DIAGNOSIS — R933 Abnormal findings on diagnostic imaging of other parts of digestive tract: Secondary | ICD-10-CM | POA: Diagnosis not present

## 2024-01-25 DIAGNOSIS — I5032 Chronic diastolic (congestive) heart failure: Secondary | ICD-10-CM

## 2024-01-25 DIAGNOSIS — D649 Anemia, unspecified: Secondary | ICD-10-CM

## 2024-01-25 HISTORY — PX: SUBMUCOSAL TATTOO INJECTION: SHX6856

## 2024-01-25 HISTORY — PX: ENTEROSCOPY: SHX5533

## 2024-01-25 LAB — CBC
HCT: 26.4 % — ABNORMAL LOW (ref 36.0–46.0)
Hemoglobin: 8 g/dL — ABNORMAL LOW (ref 12.0–15.0)
MCH: 23.3 pg — ABNORMAL LOW (ref 26.0–34.0)
MCHC: 30.3 g/dL (ref 30.0–36.0)
MCV: 76.7 fL — ABNORMAL LOW (ref 80.0–100.0)
Platelets: 294 10*3/uL (ref 150–400)
RBC: 3.44 MIL/uL — ABNORMAL LOW (ref 3.87–5.11)
RDW: 16.9 % — ABNORMAL HIGH (ref 11.5–15.5)
WBC: 7.5 10*3/uL (ref 4.0–10.5)
nRBC: 0 % (ref 0.0–0.2)

## 2024-01-25 SURGERY — ENTEROSCOPY
Anesthesia: Monitor Anesthesia Care

## 2024-01-25 MED ORDER — PROPOFOL 10 MG/ML IV BOLUS
INTRAVENOUS | Status: DC | PRN
  Administered 2024-01-25 (×3): 20 mg via INTRAVENOUS
  Administered 2024-01-25: 40 mg via INTRAVENOUS
  Administered 2024-01-25: 20 mg via INTRAVENOUS

## 2024-01-25 MED ORDER — SODIUM CHLORIDE 0.9 % IV SOLN: INTRAVENOUS | Status: DC | PRN

## 2024-01-25 MED ORDER — ONDANSETRON HCL 4 MG/2ML IJ SOLN
INTRAMUSCULAR | Status: DC | PRN
  Administered 2024-01-25: 4 mg via INTRAVENOUS

## 2024-01-25 MED ORDER — SPOT INK MARKER SYRINGE KIT
PACK | SUBMUCOSAL | Status: DC | PRN
  Administered 2024-01-25: 1.5 mL via SUBMUCOSAL

## 2024-01-25 MED ORDER — LIDOCAINE 2% (20 MG/ML) 5 ML SYRINGE
INTRAMUSCULAR | Status: DC | PRN
  Administered 2024-01-25: 60 mg via INTRAVENOUS

## 2024-01-25 NOTE — Transfer of Care (Signed)
Immediate Anesthesia Transfer of Care Note  Patient: Kristen Hansen  Procedure(s) Performed: ENTEROSCOPY SUBMUCOSAL TATTOO INJECTION  Patient Location: PACU and Endoscopy Unit  Anesthesia Type:MAC  Level of Consciousness: awake, sedated, and drowsy  Airway & Oxygen Therapy: Patient Spontanous Breathing and Patient connected to nasal cannula oxygen  Post-op Assessment: Report given to RN and Post -op Vital signs reviewed and stable  Post vital signs: Reviewed and stable  Last Vitals:  Vitals Value Taken Time  BP 149/72 01/25/24 1110  Temp 36.4 C 01/25/24 1042  Pulse 70 01/25/24 1112  Resp 12 01/25/24 1112  SpO2 96 % 01/25/24 1112  Vitals shown include unfiled device data.  Last Pain:  Vitals:   01/25/24 1110  TempSrc:   PainSc: 0-No pain         Complications: No notable events documented.

## 2024-01-25 NOTE — Progress Notes (Signed)
PT Cancellation Note  Patient Details Name: Kristen Hansen MRN: [id number] DOB: 1946-09-12   Cancelled Treatment:    Reason Eval/Treat Not Completed: Patient at procedure or test/unavailable  Off unit to endoscopy; will follow-up this afternoon as schedule permits.   Kathlyn Sacramento, PT, DPT [facility]  Rehabilitation Services Physical Therapist Office: [phone] Website: [email or web address]  Berton Mount 01/25/2024, 10:59 AM

## 2024-01-25 NOTE — Progress Notes (Signed)
Physical Therapy Treatment Patient Details Name: Kristen Hansen MRN: [id number] DOB: 11/16/1946 Today's Date: 01/25/2024   History of Present Illness 78 y/o F presenting to ED on 1/28 with low hgb (6.6) after being seen at rheumatologist, also with SOB/CP and black stools. CT imaging with swelling in duodenum, possibly consistent with lymphoma. Admitted for symptomatic anemia. BLE ultrasound negative for DVT. PMH includes dementia, sjogren's disease, HTN, hyperthyroidism on methimazole, dementia, chronic HFpEF, paroxysmal A fib on Eliquis with noncompliance, pericarditis, trigeminal neuralgia, anxiety, depression, REM behavior disorder    PT Comments  Good progress towards functional goals. Increased ambulatory distance to 100 feet with RW, at a supervision level today. No overt LOB, minor cues for awareness and direction throughout. Husband present, very supportive. Light min assist for transfer with RW but husband feels comfortable with level of assist needed to care for pt at home. Will follow and progress until d/c. HHPT remains appropriate for follow-up.    If plan is discharge home, recommend the following: A little help with walking and/or transfers;A little help with bathing/dressing/bathroom;Assistance with cooking/housework;Assist for transportation;Help with stairs or ramp for entrance   Can travel by private vehicle        Equipment Recommendations  None recommended by PT    Recommendations for Other Services       Precautions / Restrictions Precautions Precautions: Fall Restrictions Weight Bearing Restrictions Per Provider Order: No     Mobility  Bed Mobility               General bed mobility comments: Sitting EOB when PT entered room    Transfers Overall transfer level: Needs assistance Equipment used: Rolling walker (2 wheels) Transfers: Sit to/from Stand Sit to Stand: Min assist           General transfer comment: Very light min assist, near CGA to  facilitate boost into standing. Slow rise. Cues for Rt hand on bed to push up, Lt hand on RW to stabilize.    Ambulation/Gait Ambulation/Gait assistance: Supervision Gait Distance (Feet): 100 Feet Assistive device: Rolling walker (2 wheels) Gait Pattern/deviations: Step-through pattern, Decreased stride length, Knee flexed in stance - right, Knee flexed in stance - left, Trunk flexed, Antalgic, Narrow base of support Gait velocity: dec Gait velocity interpretation: <1.31 ft/sec, indicative of household ambulator   General Gait Details: Progressed to supervision level with distance today, navigating in hallway, around obstacles, but provided intermittent cues for awareness and early avoidance. Difficulty recalling which room was hers despite recalling number accurately. No overt LOB, narrow BOS, cues for awareness and safety with upright posture. Appropriately using RW for support.   Stairs             Wheelchair Mobility     Tilt Bed    Modified Rankin (Stroke Patients Only)       Balance Overall balance assessment: Needs assistance Sitting-balance support: Feet supported Sitting balance-Leahy Scale: Fair     Standing balance support: During functional activity, No upper extremity supported, Single extremity supported Standing balance-Leahy Scale: Poor                              Cognition Arousal: Alert Behavior During Therapy: WFL for tasks assessed/performed Overall Cognitive Status: History of cognitive impairments - at baseline  General Comments: hx of dementia        Exercises General Exercises - Lower Extremity Ankle Circles/Pumps: AROM, Both, 15 reps, Seated Quad Sets: Strengthening, Both, 10 reps, Supine    General Comments        Pertinent Vitals/Pain Pain Assessment Pain Assessment: No/denies pain    Home Living                          Prior Function            PT  Goals (current goals can now be found in the care plan section) Acute Rehab PT Goals Patient Stated Goal: Go home PT Goal Formulation: With patient/family Time For Goal Achievement: 02/07/24 Potential to Achieve Goals: Good Progress towards PT goals: Progressing toward goals    Frequency    Min 1X/week      PT Plan      Co-evaluation              AM-PAC PT "6 Clicks" Mobility   Outcome Measure  Help needed turning from your back to your side while in a flat bed without using bedrails?: A Little Help needed moving from lying on your back to sitting on the side of a flat bed without using bedrails?: A Little Help needed moving to and from a bed to a chair (including a wheelchair)?: A Little Help needed standing up from a chair using your arms (e.g., wheelchair or bedside chair)?: A Little Help needed to walk in hospital room?: A Little Help needed climbing 3-5 steps with a railing? : A Little 6 Click Score: 18    End of Session Equipment Utilized During Treatment: Gait belt Activity Tolerance: Patient tolerated treatment well Patient left: with call bell/phone within reach;with family/visitor present;in chair;with chair alarm set   PT Visit Diagnosis: Unsteadiness on feet (R26.81);Other abnormalities of gait and mobility (R26.89);Muscle weakness (generalized) (M62.81)     Time: 4098-1191 PT Time Calculation (min) (ACUTE ONLY): 19 min  Charges:    $Gait Training: 8-22 mins PT General Charges $$ ACUTE PT VISIT: 1 Visit                     Kathlyn Sacramento, PT, DPT [facility]  Rehabilitation Services Physical Therapist Office: [phone] Website: [email or web address]    Berton Mount 01/25/2024, 4:47 PM

## 2024-01-25 NOTE — Care Management Important Message (Signed)
Important Message  Patient Details  Name: Kristen Hansen MRN: [id number] Date of Birth: 03-Nov-1946   Important Message Given:  Yes - Medicare IM     [name] 01/25/2024, 2:18 PM

## 2024-01-25 NOTE — Anesthesia Postprocedure Evaluation (Signed)
Anesthesia Post Note  Patient: Kristen Hansen  Procedure(s) Performed: ENTEROSCOPY SUBMUCOSAL TATTOO INJECTION     Patient location during evaluation: Endoscopy Anesthesia Type: MAC Level of consciousness: awake and alert Pain management: pain level controlled Vital Signs Assessment: post-procedure vital signs reviewed and stable Respiratory status: spontaneous breathing, nonlabored ventilation and respiratory function stable Cardiovascular status: stable and blood pressure returned to baseline Postop Assessment: no apparent nausea or vomiting Anesthetic complications: no  No notable events documented.  Last Vitals:  Vitals:   01/25/24 1100 01/25/24 1110  BP: (!) 140/73 (!) 149/72  Pulse: 72 70  Resp: 12 12  Temp:    SpO2: 96% 96%    Last Pain:  Vitals:   01/25/24 1110  TempSrc:   PainSc: 0-No pain                 Clairessa Boulet,W. EDMOND

## 2024-01-25 NOTE — Op Note (Signed)
[facility] Patient Name: Kristen Hansen Procedure Date : 01/25/2024 MRN: [id number] Attending MD: Starr Lake. Myrtie Neither , MD, [id number] Date of Birth: 04-Oct-1946 CSN: [id number] Age: 78 Admit Type: Inpatient Procedure:                Small bowel enteroscopy Indications:              Anemia (chronic normocytic, then worse than several                            months before and microcytic at the time of                            admission-see consult note for details).                           Iron 35, ferritin 18 (though these may have been                            drawn after transfusion), B12 and folate normal                           Several days of dark stool noted recently by                            husband, though heme-negative at time of admission.                           Abnormal GI imaging-CTAP with IV but not oral                            contrast suggested possible wall thickening and a                            short segment of proximal jejunum without                            surrounding inflammation. Radiology report                            suggested lymphoma as "the potential diagnosis". No                            reported adenopathy in the abdominal pelvic cavity. Providers:                Starr Lake. Myrtie Neither, MD, Stephens Shire RN, RN, Harrington Challenger, Technician Referring MD:             [facility] Medicines:                 Complications:            No immediate complications. Estimated Blood Loss:     Estimated blood loss: none. Procedure:  Pre-Anesthesia Assessment:                           - Prior to the procedure, a History and Physical                            was performed, and patient medications and                            allergies were reviewed. The patient's tolerance of                            previous anesthesia was also reviewed. The risks                            and benefits of  the procedure and the sedation                            options and risks were discussed with the patient.                            All questions were answered, and informed consent                            was obtained. Prior Anticoagulants: The patient has                            taken no anticoagulant or antiplatelet agents. ASA                            Grade Assessment: III - A patient with severe                            systemic disease. After reviewing the risks and                            benefits, the patient was deemed in satisfactory                            condition to undergo the procedure.                           After obtaining informed consent, the endoscope was                            passed under direct vision. Throughout the                            procedure, the patient's blood pressure, pulse, and                            oxygen saturations were monitored continuously. The  [id number]-HQ190L ([id number]) Olympus colonoscope was                            introduced through the mouth and advanced to the                            proximal jejunum. The small bowel enteroscopy was                            accomplished without difficulty. The patient                            tolerated the procedure well. Scope In: Scope Out: Findings:      The esophagus was normal. Normal larynx      The stomach was normal.      The examined duodenum was normal.      Normal mucosa was found in the jejunum. The extent of scope insertion       was tattooed with an injection of 1 mL of Spot (carbon black). Impression:               - Normal esophagus.                           - Normal stomach.                           - Normal examined duodenum.                           - Normal mucosa was found in the jejunum. Tattooed.                           - No specimens collected.                           Based on length of scope insertion  (approximately                            120 cm of reduced scope) as well as the location of                            the reported jejunal finding on imaging, that area                            was most likely (but not certainly) reached on                            today's exam.                           Still not clear whether this reported finding in                            the jejunum is pathologic versus  underdistention/motility artifact. Recommendation:           - Return patient to hospital ward for ongoing care.                           - Resume regular diet.                           -CT enterography, timing scan to allow oral                            contrast to highlight the proximal small bowel.                           If the previous proximal small bowel finding                            persists in a similar fashion, then a small bowel                            video capsule study may be warranted versus                            conservative management in this patient with                            multiple medical comorbidities and dementia.                           There has been no overt GI bleeding during this                            admission, and no source of GI bleeding found on                            this procedure.                           Unable to reach daughter by phone - left message                            and will check in with family later today. Procedure Code(s):        --- Professional ---                           [id number], Small intestinal endoscopy, enteroscopy                            beyond second portion of duodenum, not including                            ileum; diagnostic, including collection of                            specimen(s) by brushing or washing, when performed                            (  separate procedure)                           [id number], Unlisted procedure, small  intestine Diagnosis Code(s):        --- Professional ---                           D64.9, Anemia, unspecified CPT copyright 2022 American Medical Association. All rights reserved. The codes documented in this report are preliminary and upon coder review may  be revised to meet current compliance requirements. Jovanne Riggenbach L. Myrtie Neither, MD 01/25/2024 10:56:30 AM This report has been signed electronically. Number of Addenda: 0

## 2024-01-25 NOTE — Interval H&P Note (Signed)
History and Physical Interval Note:  01/25/2024 9:36 AM  Kristen Hansen  has presented today for surgery, with the diagnosis of Abnormal CT scan of the small bowel.  The various methods of treatment have been discussed with the patient and family. After consideration of risks, benefits and other options for treatment, the patient has consented to  Procedure(s): ENTEROSCOPY (N/A) as a surgical intervention.  The patient's history has been reviewed, patient examined, no change in status, stable for surgery.  I have reviewed the patient's chart and labs.  Questions were answered to the patient's satisfaction.    Hgb stable today  [name]

## 2024-01-25 NOTE — Plan of Care (Signed)
   Problem: Health Behavior/Discharge Planning: Goal: Ability to manage health-related needs will improve Outcome: Progressing   Problem: Clinical Measurements: Goal: Ability to maintain clinical measurements within normal limits will improve Outcome: Progressing

## 2024-01-25 NOTE — Progress Notes (Signed)
Transition of Care [facility]) - Inpatient Brief Assessment   Patient Details  Name: Kristen Hansen MRN: [id number] Date of Birth: 02/07/1946  [facility] CM/SW Contact:    Janae Bridgeman, RN Phone Number: 01/25/2024, 1:57 PM   Clinical Narrative: CM met with the patient, spouse and daughter at the bedside to discuss TOC needs for home.  The patient admitted for anemia and will discharge home with family when medically stable for discharge by car.  DME at the home includes Rolator, Hospital bed, WC, RW, BSC, tub transfer bench, HH shower and lift chair.  The patient's spouse/patient were offered Medicare choice regarding home health agency and spouse states that the patient was recently active with [facility] and would like referral placed with them.  I called Gwenlyn Fudge, CM with [facility] and they plan to restart services when patient return home home.  HH orders for PT, OT and aide placed - to be co-signed by the MD.  No other TOC needs at this time and patient will discharge home by car when medically stable for discharge.   Transition of Care Asessment: Insurance and Status: (P) Insurance coverage has been reviewed Patient has primary care physician: (P) Yes Home environment has been reviewed: (P) from home with spouse Prior level of function:: (P) family assistance Prior/Current Home Services: (P) No current home services Social Drivers of Health Review: (P) SDOH reviewed interventions complete Readmission risk has been reviewed: (P) Yes Transition of care needs: (P) transition of care needs identified, TOC will continue to follow

## 2024-01-25 NOTE — Anesthesia Preprocedure Evaluation (Addendum)
Anesthesia Evaluation  Patient identified by MRN, date of birth, ID band Patient awake    Reviewed: Allergy & Precautions, H&P , NPO status , Patient's Chart, lab work & pertinent test results  History of Anesthesia Complications (+) PONV and history of anesthetic complications  Airway Mallampati: II  TM Distance: >3 FB Neck ROM: Full    Dental no notable dental hx. (+) Teeth Intact, Dental Advisory Given   Pulmonary neg pulmonary ROS   Pulmonary exam normal breath sounds clear to auscultation       Cardiovascular hypertension, Pt. on medications and Pt. on home beta blockers  Rhythm:Regular Rate:Normal     Neuro/Psych  Headaches   Depression   Dementia    GI/Hepatic Neg liver ROS,GERD  Medicated,,  Endo/Other   Hyperthyroidism   Renal/GU negative Renal ROS  negative genitourinary   Musculoskeletal  (+) Arthritis , Rheumatoid disorders,    Abdominal   Peds  Hematology  (+) Blood dyscrasia, anemia   Anesthesia Other Findings   Reproductive/Obstetrics negative OB ROS                             Anesthesia Physical Anesthesia Plan  ASA: 3  Anesthesia Plan: MAC   Post-op Pain Management: Minimal or no pain anticipated   Induction: Intravenous  PONV Risk Score and Plan: 3 and Propofol infusion and Treatment may vary due to age or medical condition  Airway Management Planned: Natural Airway and Simple Face Mask  Additional Equipment:   Intra-op Plan:   Post-operative Plan:   Informed Consent: I have reviewed the patients History and Physical, chart, labs and discussed the procedure including the risks, benefits and alternatives for the proposed anesthesia with the patient or authorized representative who has indicated his/her understanding and acceptance.     Dental advisory given  Plan Discussed with: CRNA  Anesthesia Plan Comments:        Anesthesia Quick  Evaluation

## 2024-01-25 NOTE — Progress Notes (Signed)
PROGRESS NOTE    Kristen Hansen  QMV:[id number] DOB: 1946/09/17 DOA: 01/22/2024 PCP: Eloisa Northern, MD   Brief Narrative: Kristen Hansen is a 78 y.o. female with a history of hypertension, hypothyroidism, dementia, chronic heart failure preserved EF, paroxysmal atrial fibrillation, pericarditis, Sjogren syndrome, trigeminal neuralgia, anxiety, depression, REM behavior disorder.  Patient presented secondary to an abnormal lab significant for anemia in addition to shortness of breath and dark stools.  Presentation concerning for possible GI bleeding, however initial FOBT was negative.  CT imaging significant for small bowel wall thickening concerning for possible lymphoma.  GI consulted.   Assessment/Plan:  Symptomatic anemia Unclear chronicity. Previously baseline hemoglobin off 10.6. Hemoglobin of 6.6 on admission. Initial FOBT negative. CT abdomen/pelvis significant for no etiology for bleeding. 1 unit of PRBC ordered. GI consulted with plan for endoscopic procedure. Iron panel with low-normal ferritin. -FOBT -GI recommendations: small bowel enteroscopy today, 1/31  Small bowel wall thickening  Noted on CT imaging concerning for possibly small bowel lymphoma. GI consulted. Plan for small bowel enteroscopy.  Chronic HFpEF Elevated BNP. No clinical evidence of acute heart failure, however. Transthoracic Echocardiogram ordered on admission and significant for an LVEF of 55-60% with grade 2 diastolic dysfunction; moderately elevated pulmonary artery systolic pressure.  Bilateral LE edema Seems likely related to heart failure, although there is concern for possible DVT on exam. Patient has been off of Eliquis, as well. LE venous duplex ordered and significant for no DVT.  Paroxysmal atrial fibrillation Currently in sinus rhythm and rate controlled. Patient is on metoprolol for rate control, in addition to Eliquis for stroke prevention; she is nonadherent with Eliquis. Patient is listed as  prescribed amiodarone but is not currently taking. -Continue metoprolol  Primary hypertension Patient is on irbesartan, spironolactone, metoprolol as an outpatient. Irbesartan and spironolactone held. -Continue metoprolol and irbesartan  Trigeminal neuralgia -Continue Lamictal and Trileptal  CKD stage IIIa Stable.  GERD Patient is on Protonix as an outpatient, but listed as not taking.  CAD Noted.  Non-anion gap metabolic acidosis Mild.  Depression Anxiety -Continue Zoloft  Hyperthyroidism -Continue methimazole and resume metoprolol  Allergic rhinitis -Continue Singulair  Lewy body dementia Patient is on pyridostigmine as an outpatient and follows with neurology.  Nephrolithiasis Left hydronephrosis Noted on CT imaging. Patient with a left nephroureteral stent in place.  Aortic atherosclerosis Noted on CT imaging. Patient is listed as prescribed pravastatin as an outpatient.   DVT prophylaxis: SCDs Code Status:   Code Status: Limited: Do not attempt resuscitation (DNR) -DNR-LIMITED -Do Not Intubate/DNI  Family Communication: Husband at bedside Disposition Plan: Discharge pending ongoing GI recommendations/management   Consultants:  [facility]  Procedures:  None  Antimicrobials: None    Subjective: No issues noted from overnight events.  Objective: BP (!) 168/85 (BP Location: Left Arm)   Pulse 72   Temp 97.7 F (36.5 C) (Oral)   Resp 18   Ht 5\' 10"  (1.778 m)   Wt 61.7 kg   SpO2 98%   BMI 19.52 kg/m   Examination:  General exam: Appears calm and comfortable Respiratory system: Clear to auscultation. Respiratory effort normal. Cardiovascular system: S1 & S2 heard, RRR. No murmurs. Gastrointestinal system: Normal bowel sounds heard.   Data Reviewed: I have personally reviewed following labs and imaging studies   Last CBC Lab Results  Component Value Date   WBC 7.1 01/24/2024   HGB 7.7 (L) 01/24/2024   HCT 25.7 (L) 01/24/2024   MCV  77.4 (L) 01/24/2024  MCH 23.2 (L) 01/24/2024   RDW 16.5 (H) 01/24/2024   PLT 300 01/24/2024     Last metabolic panel Lab Results  Component Value Date   GLUCOSE 91 01/24/2024   NA 136 01/24/2024   K 4.2 01/24/2024   CL 105 01/24/2024   CO2 21 (L) 01/24/2024   BUN 18 01/24/2024   CREATININE 0.98 01/24/2024   GFRNONAA 59 (L) 01/24/2024   CALCIUM 8.6 (L) 01/24/2024   PHOS 2.5 09/03/2023   PROT 4.7 (L) 08/30/2023   ALBUMIN 2.0 (L) 08/30/2023   LABGLOB 2.1 06/03/2021   LABGLOB 2.4 06/03/2021   AGRATIO 2.0 06/03/2021   BILITOT 0.4 08/30/2023   ALKPHOS 112 08/30/2023   AST 20 08/30/2023   ALT 22 08/30/2023   ANIONGAP 10 01/24/2024     Creatinine Clearance: Estimated Creatinine Clearance: 46.8 mL/min (by C-G formula based on SCr of 0.98 mg/dL).  No results found for this or any previous visit (from the past 240 hours).    Radiology Studies: VAS [place] LOWER EXTREMITY VENOUS (DVT) Result Date: 01/23/2024  Lower Venous DVT Study Patient Name:  Kristen Hansen  Date of Exam:   01/23/2024 Medical Rec #: [id number]      Accession #:    [id number] Date of Birth: [birth year]      Patient Gender: F Patient Age:   43 years Exam Location:  [facility] Procedure:      VAS [place] LOWER EXTREMITY VENOUS (DVT) Referring Phys: Dow Adolph --------------------------------------------------------------------------------  Indications: Edema.  Anticoagulation: Eliquis (noncompliant). Limitations: Patient unable to cooperate. Comparison Study: Previous exam on 07/26/2023 was negative for DVT Performing Technologist: Ernestene Mention RVT, RDMS  Examination Guidelines: A complete evaluation includes B-mode imaging, spectral Doppler, color Doppler, and power Doppler as needed of all accessible portions of each vessel. Bilateral testing is considered an integral part of a complete examination. Limited examinations for reoccurring indications may be performed as noted. The reflux portion of the exam is performed with the  patient in reverse Trendelenburg.  +---------+---------------+---------+-----------+----------+--------------+ RIGHT    CompressibilityPhasicitySpontaneityPropertiesThrombus Aging +---------+---------------+---------+-----------+----------+--------------+ CFV      Full           Yes      Yes                                 +---------+---------------+---------+-----------+----------+--------------+ SFJ      Full                                                        +---------+---------------+---------+-----------+----------+--------------+ FV Prox  Full           Yes      Yes                                 +---------+---------------+---------+-----------+----------+--------------+ FV Mid   Full           Yes      Yes                                 +---------+---------------+---------+-----------+----------+--------------+ FV DistalFull           Yes  Yes                                 +---------+---------------+---------+-----------+----------+--------------+ PFV      Full                                                        +---------+---------------+---------+-----------+----------+--------------+ POP      Full           Yes      Yes                                 +---------+---------------+---------+-----------+----------+--------------+ PTV      Full                                                        +---------+---------------+---------+-----------+----------+--------------+ PERO     Full                                                        +---------+---------------+---------+-----------+----------+--------------+   +---------+---------------+---------+-----------+----------+-------------------+ LEFT     CompressibilityPhasicitySpontaneityPropertiesThrombus Aging      +---------+---------------+---------+-----------+----------+-------------------+ CFV      Full                                                              +---------+---------------+---------+-----------+----------+-------------------+ SFJ      Full                                                             +---------+---------------+---------+-----------+----------+-------------------+ FV Prox  Full                                                             +---------+---------------+---------+-----------+----------+-------------------+ FV Mid   Full                                                             +---------+---------------+---------+-----------+----------+-------------------+ FV DistalFull                                                             +---------+---------------+---------+-----------+----------+-------------------+  PFV      Full                                                             +---------+---------------+---------+-----------+----------+-------------------+ POP      Full                                                             +---------+---------------+---------+-----------+----------+-------------------+ PTV      Full                                                             +---------+---------------+---------+-----------+----------+-------------------+ PERO     Full                                         Not well visualized +---------+---------------+---------+-----------+----------+-------------------+     Summary: BILATERAL: - No evidence of deep vein thrombosis seen in the lower extremities, bilaterally. - RIGHT: - No cystic structure found in the popliteal fossa.  LEFT: - A heterogenous cystic structure is found in the popliteal fossa (4.0 x 2.33 x 3.97 cm). Subcutaneous edema of calf and ankle.  *See table(s) above for measurements and observations. Electronically signed by Heath Lark on 01/23/2024 at 4:15:49 PM.    Final    ECHOCARDIOGRAM COMPLETE Result Date: 01/23/2024    ECHOCARDIOGRAM REPORT   Patient Name:   Kristen Hansen Date of Exam:  01/23/2024 Medical Rec #:  [id number]     Height:       70.0 in Accession #:    [id number]    Weight:       113.1 lb Date of Birth:  11/25/1946     BSA:          1.638 m Patient Age:    78 years      BP:           168/82 mmHg Patient Gender: F             HR:           75 bpm. Exam Location:  Inpatient Procedure: 2D Echo, Cardiac Doppler, Color Doppler and Intracardiac            Opacification Agent Indications:    CHF  History:        Patient has prior history of Echocardiogram examinations, most                 recent 07/26/2023. Mitral Valve Disease, Signs/Symptoms:Chest                 Pain; Risk Factors:Hypertension and HLD.  Sonographer:    Webb Laws Referring Phys: [id number] CAROLE N HALL IMPRESSIONS  1. Left ventricular ejection fraction, by estimation, is 55 to 60%. The left ventricle has normal function. The left ventricle has no regional wall motion abnormalities.  There is moderate concentric left ventricular hypertrophy of the basal-septal segment. Left ventricular diastolic parameters are consistent with Grade II diastolic dysfunction (pseudonormalization).  2. Right ventricular systolic function is normal. The right ventricular size is normal. There is moderately elevated pulmonary artery systolic pressure. The estimated right ventricular systolic pressure is 53.4 mmHg.  3. The mitral valve is normal in structure. Trivial mitral valve regurgitation. No evidence of mitral stenosis.  4. The aortic valve is normal in structure. Aortic valve regurgitation is not visualized. No aortic stenosis is present.  5. The inferior vena cava is normal in size with greater than 50% respiratory variability, suggesting right atrial pressure of 3 mmHg. FINDINGS  Left Ventricle: Left ventricular ejection fraction, by estimation, is 55 to 60%. The left ventricle has normal function. The left ventricle has no regional wall motion abnormalities. The left ventricular internal cavity size was normal in size. There is   moderate concentric left ventricular hypertrophy of the basal-septal segment. Left ventricular diastolic parameters are consistent with Grade II diastolic dysfunction (pseudonormalization). Right Ventricle: The right ventricular size is normal. No increase in right ventricular wall thickness. Right ventricular systolic function is normal. There is moderately elevated pulmonary artery systolic pressure. The tricuspid regurgitant velocity is 3.55 m/s, and with an assumed right atrial pressure of 3 mmHg, the estimated right ventricular systolic pressure is 53.4 mmHg. Left Atrium: Left atrial size was normal in size. Right Atrium: Right atrial size was normal in size. Pericardium: There is no evidence of pericardial effusion. Mitral Valve: The mitral valve is normal in structure. There is mild thickening of the mitral valve leaflet(s). Trivial mitral valve regurgitation. No evidence of mitral valve stenosis. Tricuspid Valve: The tricuspid valve is normal in structure. Tricuspid valve regurgitation is not demonstrated. No evidence of tricuspid stenosis. Aortic Valve: The aortic valve is normal in structure. Aortic valve regurgitation is not visualized. No aortic stenosis is present. Pulmonic Valve: The pulmonic valve was normal in structure. Pulmonic valve regurgitation is trivial. No evidence of pulmonic stenosis. Aorta: The aortic root is normal in size and structure. Venous: The inferior vena cava is normal in size with greater than 50% respiratory variability, suggesting right atrial pressure of 3 mmHg. IAS/Shunts: No atrial level shunt detected by color flow Doppler.  LEFT VENTRICLE PLAX 2D LVIDd:         3.30 cm      Diastology LVIDs:         2.60 cm      LV e' medial:    4.68 cm/s LV PW:         1.20 cm      LV E/e' medial:  33.8 LV IVS:        1.40 cm      LV e' lateral:   7.18 cm/s LVOT diam:     1.90 cm      LV E/e' lateral: 22.0 LV SV:         49 LV SV Index:   30 LVOT Area:     2.84 cm  LV Volumes (MOD) LV  vol d, MOD A2C: 70.9 ml LV vol d, MOD A4C: 105.0 ml LV vol s, MOD A2C: 34.6 ml LV vol s, MOD A4C: 36.9 ml LV SV MOD A2C:     36.3 ml LV SV MOD A4C:     105.0 ml LV SV MOD BP:      52.1 ml RIGHT VENTRICLE  IVC RV Basal diam:  4.00 cm     IVC diam: 1.80 cm RV S prime:     12.10 cm/s TAPSE (M-mode): 2.2 cm LEFT ATRIUM             Index        RIGHT ATRIUM           Index LA diam:        3.50 cm 2.14 cm/m   RA Area:     14.90 cm LA Vol (A2C):   45.4 ml 27.71 ml/m  RA Volume:   39.10 ml  23.87 ml/m LA Vol (A4C):   45.6 ml 27.84 ml/m LA Biplane Vol: 46.8 ml 28.57 ml/m  AORTIC VALVE             PULMONIC VALVE LVOT Vmax:   86.40 cm/s  PR End Diast Vel: 1.91 msec LVOT Vmean:  61.300 cm/s LVOT VTI:    0.174 m  AORTA Ao Root diam: 2.60 cm Ao Asc diam:  3.10 cm MITRAL VALVE                TRICUSPID VALVE MV Area (PHT): 4.39 cm     TR Peak grad:   50.4 mmHg MV Decel Time: 173 msec     TR Vmax:        355.00 cm/s MV E velocity: 158.00 cm/s MV A velocity: 114.00 cm/s  SHUNTS MV E/A ratio:  1.39         Systemic VTI:  0.17 m                             Systemic Diam: 1.90 cm Aditya Sabharwal Electronically signed by Dorthula Nettles Signature Date/Time: 01/23/2024/11:58:31 AM    Final       LOS: 3 days    Jacquelin Hawking, MD [facility] 01/25/2024, 7:11 AM   If 7PM-7AM, please contact night-coverage [email or web address]

## 2024-01-25 NOTE — Interval H&P Note (Signed)
History and Physical Interval Note:  01/25/2024 10:01 AM  Kristen Hansen  has presented today for surgery, with the diagnosis of Abnormal CT scan of the small bowel.  The various methods of treatment have been discussed with the patient and family. After consideration of risks, benefits and other options for treatment, the patient has consented to  Procedure(s): ENTEROSCOPY (N/A) as a surgical intervention.  The patient's history has been reviewed, patient examined, no change in status, stable for surgery.  I have reviewed the patient's chart and labs.  Questions were answered to the patient's satisfaction.     [name]

## 2024-01-26 DIAGNOSIS — D649 Anemia, unspecified: Secondary | ICD-10-CM | POA: Diagnosis not present

## 2024-01-26 DIAGNOSIS — D509 Iron deficiency anemia, unspecified: Secondary | ICD-10-CM | POA: Diagnosis not present

## 2024-01-26 DIAGNOSIS — I5032 Chronic diastolic (congestive) heart failure: Secondary | ICD-10-CM | POA: Diagnosis not present

## 2024-01-26 DIAGNOSIS — I480 Paroxysmal atrial fibrillation: Secondary | ICD-10-CM | POA: Diagnosis not present

## 2024-01-26 DIAGNOSIS — R933 Abnormal findings on diagnostic imaging of other parts of digestive tract: Secondary | ICD-10-CM

## 2024-01-26 LAB — CBC
HCT: 24 % — ABNORMAL LOW (ref 36.0–46.0)
Hemoglobin: 7.2 g/dL — ABNORMAL LOW (ref 12.0–15.0)
MCH: 23.2 pg — ABNORMAL LOW (ref 26.0–34.0)
MCHC: 30 g/dL (ref 30.0–36.0)
MCV: 77.2 fL — ABNORMAL LOW (ref 80.0–100.0)
Platelets: 278 10*3/uL (ref 150–400)
RBC: 3.11 MIL/uL — ABNORMAL LOW (ref 3.87–5.11)
RDW: 17.2 % — ABNORMAL HIGH (ref 11.5–15.5)
WBC: 7.2 10*3/uL (ref 4.0–10.5)
nRBC: 0 % (ref 0.0–0.2)

## 2024-01-26 LAB — BILIRUBIN, FRACTIONATED(TOT/DIR/INDIR)
Bilirubin, Direct: <0.1 mg/dL (ref 0.0–0.2)
Total Bilirubin: 0.3 mg/dL (ref 0.0–1.2)

## 2024-01-26 LAB — HEMOGLOBIN AND HEMATOCRIT, BLOOD
HCT: 25.4 % — ABNORMAL LOW (ref 36.0–46.0)
Hemoglobin: 7.6 g/dL — ABNORMAL LOW (ref 12.0–15.0)

## 2024-01-26 LAB — LACTATE DEHYDROGENASE: LDH: 176 U/L (ref 98–192)

## 2024-01-26 NOTE — Progress Notes (Addendum)
[facility] Progress Note  CC:  Acute on chronic anemia, abnormal CT abdomen and pelvis   Subjective:  Feels fine.  No abdominal pain.  Husband is at bedside.    Objective:  Vital signs in last 24 hours: Temp:  [97.5 F (36.4 C)-98.4 F (36.9 C)] 98.1 F (36.7 C) (02/01 0335) Pulse Rate:  [66-76] 76 (02/01 0848) Resp:  [10-18] 18 (02/01 0845) BP: (122-156)/(62-80) 156/64 (02/01 0848) SpO2:  [96 %-98 %] 97 % (02/01 0335) Weight:  [69.2 kg] 69.2 kg (02/01 0528)   General:  Alert, Well-developed, in NAD Heart:  Regular rate and rhythm; no murmurs Pulm:  CTAB.  No W/R/R. Abdomen:  Soft, non-distended.  BS present.  Non-tender. Extremities:  Without edema.  Intake/Output from previous day: 01/31 0701 - 02/01 0700 In: 150 [I.V.:150] Out: -   Lab Results: Recent Labs    01/24/24 0721 01/25/24 0726 01/26/24 0725  WBC 7.1 7.5 7.2  HGB 7.7* 8.0* 7.2*  HCT 25.7* 26.4* 24.0*  PLT 300 294 278   BMET Recent Labs    01/24/24 0721  NA 136  K 4.2  CL 105  CO2 21*  GLUCOSE 91  BUN 18  CREATININE 0.98  CALCIUM 8.6*   Assessment / Plan: Acute on chronic anemia, has been normocytic up to this admission when it is now microcytic.  Possibly multifactorial.  Hgb was 6.6 grams and she received one unit of PRBCs.  Hgb is 7.2 grams today, which is down from around 8 g the last couple of days.   Altered character of stool, reportedly black stool last few days.  Unknown if this represents GI bleeding, as the patient is reportedly FOBT negative here.   Abnormal GI imaging-CT abdomen and pelvis done for anemia with concern for GI blood loss.  This shows some nonspecific possible thickening of the proximal jejunum.  Images reviewed, they are limited by the lack of oral contrast and difficult to say if they represent true inflammation or neoplastic process such as the lymphoma suggested by radiology.  No adjacent adenopathy or other abdominal/retroperitoneal adenopathy  described.   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  Elevated BNP suggests this could be a contributing factor.  Hypoalbuminemia also contributing (last albumin on file was 2.0 in September 2024)   PAF:  On Eliquis as outpatient but nonadherent to it by notes and daughter says that she has not been on it for probably 2 weeks.  **Enteroscopy on 1/31 was normal to the area that was accessed.  Distal area was tattooed.    -Await results of CT enterography that was ordered after her enteroscopy yesterday. -Monitor hemoglobin and transfuse further if needed.  **Called back to discuss with the family again in regards to the CT enterography.  After some conversation they have decided not to proceed.  They stated if a mass were present they would not proceed with any other workup, evaluation, or treatment.  I have canceled the study and made the hospitalist aware.  Patient would likely benefit from a palliative care consult to establish goals of care.    LOS: 4 days   Princella Pellegrini. Zehr  01/26/2024, 9:35 AM    My additional thoughts are as follows:  Unknown clinical significance of proximal jejunal finding initially described on CTA, probably but not  certainly reached on small bowel enteroscopy yesterday. Acute on chronic anemia that may not be related to this issue since she was also heme-negative. Family is ultimately decided not to have the CT enterography that we had ordered in hopes of seeing whether the previously described abnormality was still present and thus possibly requiring small bowel video capsule study. I can certainly appreciate this family's concerns and desire to limit further testing given Ms. Sundeen's overall condition. Therefore, no further recommendations from the inpatient GI service and we  will sign off.  Appreciate being involved in her care.  [name] Office:[phone]

## 2024-01-26 NOTE — Plan of Care (Signed)

## 2024-01-26 NOTE — Progress Notes (Addendum)
PROGRESS NOTE    Kristen Hansen  GNF:[id number] DOB: 10-05-1946 DOA: 01/22/2024 PCP: Eloisa Northern, MD   Brief Narrative: Kristen Hansen is a 78 y.o. female with a history of hypertension, hypothyroidism, dementia, chronic heart failure preserved EF, paroxysmal atrial fibrillation, pericarditis, Sjogren syndrome, trigeminal neuralgia, anxiety, depression, REM behavior disorder.  Patient presented secondary to an abnormal lab significant for anemia in addition to shortness of breath and dark stools.  Presentation concerning for possible GI bleeding, however initial FOBT was negative.  CT imaging significant for small bowel wall thickening concerning for possible lymphoma.  GI consulted.   Assessment/Plan:  Symptomatic anemia Unclear chronicity. Previously baseline hemoglobin off 10.6. Hemoglobin of 6.6 on admission. Initial FOBT negative. CT abdomen/pelvis significant for no etiology for bleeding. 1 unit of PRBC ordered. GI consulted with plan for endoscopic procedure. Iron panel with low-normal ferritin. Small bowel enteroscopy normal. CBC labile. 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 GI consulted. Small bowel enteroscopy was not able to visualize abnormality seen on CT imaging. -GI recommendations: CT enterography planned with possible subsequent capsule endoscopy  Chronic HFpEF Elevated BNP. No clinical evidence of acute heart failure, however. Transthoracic Echocardiogram ordered on admission and significant for an LVEF of 55-60% with grade 2 diastolic dysfunction; moderately elevated pulmonary artery systolic pressure.  Bilateral LE edema Seems likely related to heart failure, although there is concern for possible DVT on exam. Patient has been off of Eliquis, as well. LE venous duplex ordered and significant for no DVT.  Paroxysmal atrial fibrillation Currently in sinus rhythm and rate controlled. Patient is on  metoprolol for rate control, in addition to Eliquis for stroke prevention; she is nonadherent with Eliquis. Patient is listed as prescribed amiodarone but is not currently taking. -Continue metoprolol  Primary hypertension Patient is on irbesartan, spironolactone, metoprolol as an outpatient. Irbesartan and spironolactone held. -Continue metoprolol and irbesartan  Trigeminal neuralgia -Continue Lamictal and Trileptal  CKD stage IIIa Stable.  GERD Patient is on Protonix as an outpatient, but listed as not taking.  CAD Noted.  Non-anion gap metabolic acidosis Mild.  Depression Anxiety -Continue Zoloft  Hyperthyroidism -Continue methimazole and resume metoprolol  Allergic rhinitis -Continue Singulair  Lewy body dementia Patient is on pyridostigmine as an outpatient and follows with neurology.  Nephrolithiasis Left hydronephrosis Noted on CT imaging. Patient with a left nephroureteral stent in place.  Aortic atherosclerosis Noted on CT imaging. Patient is listed as prescribed pravastatin as an outpatient.   DVT prophylaxis: SCDs Code Status:   Code Status: Limited: Do not attempt resuscitation (DNR) -DNR-LIMITED -Do Not Intubate/DNI  Family Communication: Husband at bedside Disposition Plan: Discharge pending ongoing GI recommendations/management   Consultants:  [facility]  Procedures:  None  Antimicrobials: None    Subjective: No concerns this morning.  Objective: BP 136/67 (BP Location: Left Arm)   Pulse 66   Temp 98.1 F (36.7 C) (Oral)   Resp 18   Ht 5\' 10"  (1.778 m)   Wt 69.2 kg   SpO2 97%   BMI 21.89 kg/m   Examination:  General exam: Appears calm and comfortable Respiratory system: Clear to auscultation. Respiratory effort normal. Cardiovascular system: S1 & S2 heard, RRR. Gastrointestinal system: Abdomen is nondistended, soft and nontender. Normal bowel sounds heard. Central nervous system: Alert. No focal neurological  deficits.   Data Reviewed: I have personally reviewed following labs and imaging studies   Last CBC Lab Results  Component Value Date   WBC 7.5 01/25/2024   HGB 8.0 (L) 01/25/2024   HCT 26.4 (L) 01/25/2024   MCV 76.7 (L) 01/25/2024   MCH 23.3 (L) 01/25/2024   RDW 16.9 (H) 01/25/2024   PLT 294 01/25/2024     Last metabolic panel Lab Results  Component Value Date   GLUCOSE 91 01/24/2024   NA 136 01/24/2024   K 4.2 01/24/2024   CL 105 01/24/2024   CO2 21 (L) 01/24/2024   BUN 18 01/24/2024   CREATININE 0.98 01/24/2024   GFRNONAA 59 (L) 01/24/2024   CALCIUM 8.6 (L) 01/24/2024   PHOS 2.5 09/03/2023   PROT 4.7 (L) 08/30/2023   ALBUMIN 2.0 (L) 08/30/2023   LABGLOB 2.1 06/03/2021   LABGLOB 2.4 06/03/2021   AGRATIO 2.0 06/03/2021   BILITOT 0.4 08/30/2023   ALKPHOS 112 08/30/2023   AST 20 08/30/2023   ALT 22 08/30/2023   ANIONGAP 10 01/24/2024     Creatinine Clearance: Estimated Creatinine Clearance: 52 mL/min (by C-G formula based on SCr of 0.98 mg/dL).  No results found for this or any previous visit (from the past 240 hours).    Radiology Studies: No results found.     LOS: 4 days    Jacquelin Hawking, MD [facility] 01/26/2024, 8:12 AM   If 7PM-7AM, please contact night-coverage [email or web address]

## 2024-01-26 NOTE — Progress Notes (Addendum)
   01/26/24 1530  Mobility  Activity Ambulated with assistance in hallway  Level of Assistance Moderate assist, patient does 50-74%  Assistive Device Front wheel walker  Distance Ambulated (ft) 30 ft  Activity Response Tolerated fair  Mobility Referral Yes  Mobility visit 1 Mobility  Mobility Specialist Start Time (ACUTE ONLY) 1501  Mobility Specialist Stop Time (ACUTE ONLY) 1530  Mobility Specialist Time Calculation (min) (ACUTE ONLY) 29 min   Mobility Specialist: Progress Note  During Mobility: HR 70-74, BP 144/79 (96)   Pt agreeable to mobility session - received in bed. C/o dizziness, L knee pain and blurry vision (daughter stated d/t cataracts) - RN notified via secure chat. Required one seated break during ambulation. Returned to sitting upright in bed with all needs met - call bell within reach. Daughter and husband present.   Kristen Hansen, BS Mobility Specialist Please contact via SecureChat or  Rehab office at [phone].

## 2024-01-27 ENCOUNTER — Other Ambulatory Visit: Payer: Self-pay | Admitting: Internal Medicine

## 2024-01-27 DIAGNOSIS — D509 Iron deficiency anemia, unspecified: Secondary | ICD-10-CM | POA: Diagnosis not present

## 2024-01-27 LAB — CBC
HCT: 26.8 % — ABNORMAL LOW (ref 36.0–46.0)
Hemoglobin: 7.9 g/dL — ABNORMAL LOW (ref 12.0–15.0)
MCH: 22.9 pg — ABNORMAL LOW (ref 26.0–34.0)
MCHC: 29.5 g/dL — ABNORMAL LOW (ref 30.0–36.0)
MCV: 77.7 fL — ABNORMAL LOW (ref 80.0–100.0)
Platelets: 287 10*3/uL (ref 150–400)
RBC: 3.45 MIL/uL — ABNORMAL LOW (ref 3.87–5.11)
RDW: 17.5 % — ABNORMAL HIGH (ref 11.5–15.5)
WBC: 6.8 10*3/uL (ref 4.0–10.5)
nRBC: 0 % (ref 0.0–0.2)

## 2024-01-27 NOTE — Discharge Instructions (Signed)
Kristen Hansen,  You were in the hospital with low blood counts. It is unclear why this has occurred. There was an abnormality seen on your CT scan but it is unclear if this was evaluated when you had your endoscopy procedure. You have decided not to continue workup for this, however. Please follow-up with your PCP.

## 2024-01-27 NOTE — Plan of Care (Signed)
  Problem: Activity: Goal: Risk for activity intolerance will decrease Outcome: Progressing   Problem: Nutrition: Goal: Adequate nutrition will be maintained Outcome: Progressing   Problem: Coping: Goal: Level of anxiety will decrease Outcome: Progressing   Problem: Elimination: Goal: Will not experience complications related to bowel motility Outcome: Progressing   

## 2024-01-27 NOTE — Discharge Summary (Addendum)
Physician Discharge Summary   Patient: Kristen Hansen MRN: [id number] DOB: [birth year]  Admit date:     01/22/2024  Discharge date: 01/27/24  Discharge Physician: Jacquelin Hawking, MD   PCP: Eloisa Northern, MD   Recommendations at discharge:  PCP visit for hospital follow-up Repeat CBC in 10-14 days Follow-up haptoglobin  Discharge Diagnoses: Principal Problem:   Microcytic anemia Active Problems:   Trigeminal neuralgia   HTN (hypertension)   Lewy body dementia without behavioral disturbance, psychotic disturbance, mood disturbance, or anxiety (HCC)   CKD (chronic kidney disease) stage 3, GFR 30-59 ml/min (HCC)   Hyperthyroidism   PAF (paroxysmal atrial fibrillation) (HCC)   Abnormal finding on GI tract imaging  Resolved Problems:   * No resolved hospital problems. *  Hospital Course: Kristen Hansen is a 78 y.o. female with a history of hypertension, hypothyroidism, dementia, chronic heart failure preserved EF, paroxysmal atrial fibrillation, pericarditis, Sjogren syndrome, trigeminal neuralgia, anxiety, depression, REM behavior disorder.  Patient presented secondary to an abnormal lab significant for anemia in addition to shortness of breath and dark stools.  Presentation concerning for possible GI bleeding, however initial FOBT was negative.  CT imaging significant for small bowel wall thickening concerning for possible lymphoma.  GI consulted. Small bowel enteroscopy performed on 1/31 without evidence for etiology of bleeding. Further evaluation deferred secondary to patient/family preference to no longer pursue diagnosis.  Assessment and Plan:  Symptomatic anemia Unclear chronicity. Previously baseline hemoglobin off 10.6. Hemoglobin of 6.6 on admission. Initial FOBT negative. CT abdomen/pelvis significant for no etiology for bleeding. 1 unit of PRBC ordered. GI consulted with plan for endoscopic procedure. Iron panel with low-normal ferritin. Small bowel enteroscopy normal. LDL normal.  Bilirubin normal. Reticulocyte count normal, but may be abnormally normal for degree of anemia. Hemoglobin stable prior to discharge. Haptoglobin pending at time of discharge. If patient/family desires, hematology consult can be considered as an outpatient.   Small bowel wall thickening  Noted on CT imaging concerning for possibly small bowel lymphoma. GI consulted. Small bowel enteroscopy was not able to visualize abnormality seen on CT imaging. CT enterography planned however this was deferred per patient/family preference.   Chronic HFpEF Elevated BNP. No clinical evidence of acute heart failure, however. Transthoracic Echocardiogram ordered on admission and significant for an LVEF of 55-60% with grade 2 diastolic dysfunction; moderately elevated pulmonary artery systolic pressure.   Bilateral LE edema Seems likely related to heart failure, although there is concern for possible DVT on exam. Patient has been off of Eliquis, as well. LE venous duplex ordered and significant for no DVT.   Paroxysmal atrial fibrillation Currently in sinus rhythm and rate controlled. Patient is on metoprolol for rate control, in addition to Eliquis for stroke prevention; she is nonadherent with Eliquis. Patient is listed as prescribed amiodarone but is not currently taking. Continue outpatient regimen.   Primary hypertension Patient is on irbesartan, spironolactone, metoprolol as an outpatient. Irbesartan and spironolactone held. Continue outpatient regimen on discharge.   Trigeminal neuralgia Continue Lamictal and Trileptal   CKD stage IIIa Stable.   GERD Patient is on Protonix as an outpatient, but listed as not taking.   CAD Noted.   Non-anion gap metabolic acidosis Mild.   Depression Anxiety Continue Zoloft   Hyperthyroidism Continue methimazole and resume metoprolol   Allergic rhinitis -Continue Singulair   Lewy body dementia Patient is on pyridostigmine as an outpatient and follows  with neurology.   Nephrolithiasis Left hydronephrosis Noted on CT imaging. Patient with a  left nephroureteral stent in place.   Aortic atherosclerosis Noted on CT imaging. Patient is listed as prescribed pravastatin as an outpatient.   Consultants:  [facility]   Procedures:  Small bowel enteroscopy  Disposition: Home Diet recommendation: Cardiac diet   DISCHARGE MEDICATION: Allergies as of 01/27/2024       Reactions   Morphine And Codeine Hives   Prevacid [lansoprazole] Other (See Comments)   unknown   Provigil [modafinil] Other (See Comments)   dizziness   Adhesive [tape] Rash   bandaids         Medication List     PAUSE taking these medications    amiodarone 200 MG tablet Wait to take this until your doctor or other care provider tells you to start again. Commonly known as: PACERONE Take 1 tablet (200 mg total) by mouth daily.   colchicine 0.6 MG tablet Wait to take this until your doctor or other care provider tells you to start again. Take 1 tablet (0.6 mg total) by mouth daily.   montelukast 10 MG tablet Wait to take this until your doctor or other care provider tells you to start again. Commonly known as: SINGULAIR Take 10 mg by mouth at bedtime.   pantoprazole 40 MG tablet Wait to take this until your doctor or other care provider tells you to start again. Commonly known as: PROTONIX Take 1 tablet (40 mg total) by mouth daily.   pravastatin 40 MG tablet Wait to take this until your doctor or other care provider tells you to start again. Commonly known as: PRAVACHOL Take 40 mg by mouth daily before breakfast.   pyridostigmine 60 MG tablet Wait to take this until your doctor or other care provider tells you to start again. Commonly known as: MESTINON Take 1 tablet (60 mg total) by mouth every 8 (eight) hours.   sertraline 50 MG tablet Wait to take this until your doctor or other care provider tells you to start again. Commonly known as:  ZOLOFT Take 1 tablet by mouth once daily       STOP taking these medications    mouth rinse Liqd solution   valACYclovir 500 MG tablet Commonly known as: VALTREX       TAKE these medications    acetaminophen 325 MG tablet Commonly known as: TYLENOL Take 2 tablets (650 mg total) by mouth every 6 (six) hours as needed for mild pain (or Fever >/= 101).   apixaban 5 MG Tabs tablet Commonly known as: ELIQUIS Take 1 tablet (5 mg total) by mouth 2 (two) times daily.   feeding supplement Liqd Take 237 mLs by mouth 2 (two) times daily between meals.   Guaifenesin 200 MG/5ML Liqd Take 10 mLs by mouth every 4 (four) hours as needed (cough and congestion).   irbesartan 150 MG tablet Commonly known as: AVAPRO Take 1 tablet (150 mg total) by mouth daily.   lamoTRIgine 150 MG tablet Commonly known as: LAMICTAL Take 1 tablet (150 mg total) by mouth daily.   meclizine 12.5 MG tablet Commonly known as: ANTIVERT Take 12.5 mg by mouth every 8 (eight) hours as needed.   methimazole 10 MG tablet Commonly known as: TAPAZOLE Take 10 mg by mouth daily.   metoprolol tartrate 25 MG tablet Commonly known as: LOPRESSOR Take 1 tablet (25 mg total) by mouth 2 (two) times daily.   nitroGLYCERIN 0.4 MG SL tablet Commonly known as: NITROSTAT Place 1 tablet (0.4 mg total) under the tongue every 5 (five) minutes as  needed for chest pain.   Oxcarbazepine 300 MG tablet Commonly known as: TRILEPTAL Take 1 tablet (300 mg total) by mouth 2 (two) times daily.   polyethylene glycol 17 g packet Commonly known as: MIRALAX / GLYCOLAX Take 17 g by mouth daily as needed for mild constipation.   spironolactone 25 MG tablet Commonly known as: ALDACTONE Take 0.5 tablets (12.5 mg total) by mouth daily.        Follow-up Information     [facility], [facility] Follow up.   Specialty: Home Health Services Why: [facility] will provide home health services.  They will call you in the next  24-48 hours to start services. Contact information: [address] [place] [address] [phone]         Eloisa Northern, MD. Schedule an appointment as soon as possible for a visit in 1 week(s).   Specialty: Internal Medicine Why: For hospital follow-up Contact information: [address] [place] [address] [phone]                Discharge Exam: BP (!) 110/47   Pulse 77   Temp (!) 97.5 F (36.4 C) (Oral)   Resp 16   Ht 5\' 10"  (1.778 m)   Wt 69.2 kg   SpO2 100%   BMI 21.89 kg/m   General exam: Appears calm and comfortable Respiratory system: Clear to auscultation. Respiratory effort normal. Cardiovascular system: S1 & S2 heard, RRR. Gastrointestinal system: Abdomen is nondistended, soft and nontender. Normal bowel sounds heard. Central nervous system: Alert Musculoskeletal: No edema. No calf tenderness  Condition at discharge: stable  The results of significant diagnostics from this hospitalization (including imaging, microbiology, ancillary and laboratory) are listed below for reference.   Imaging Studies: VAS [place] LOWER EXTREMITY VENOUS (DVT) Result Date: 01/23/2024  Lower Venous DVT Study Patient Name:  Kristen Hansen  Date of Exam:   01/23/2024 Medical Rec #: [id number]      Accession #:    [id number] Date of Birth: Oct 20, 1946      Patient Gender: F Patient Age:   60 years Exam Location:  [facility] Procedure:      VAS [place] LOWER EXTREMITY VENOUS (DVT) Referring Phys: Dow Adolph --------------------------------------------------------------------------------  Indications: Edema.  Anticoagulation: Eliquis (noncompliant). Limitations: Patient unable to cooperate. Comparison Study: Previous exam on 07/26/2023 was negative for DVT Performing Technologist: Ernestene Mention RVT, RDMS  Examination Guidelines: A complete evaluation includes B-mode imaging, spectral Doppler, color Doppler, and power Doppler as needed of all accessible portions of each  vessel. Bilateral testing is considered an integral part of a complete examination. Limited examinations for reoccurring indications may be performed as noted. The reflux portion of the exam is performed with the patient in reverse Trendelenburg.  +---------+---------------+---------+-----------+----------+--------------+ RIGHT    CompressibilityPhasicitySpontaneityPropertiesThrombus Aging +---------+---------------+---------+-----------+----------+--------------+ CFV      Full           Yes      Yes                                 +---------+---------------+---------+-----------+----------+--------------+ SFJ      Full                                                        +---------+---------------+---------+-----------+----------+--------------+  FV Prox  Full           Yes      Yes                                 +---------+---------------+---------+-----------+----------+--------------+ FV Mid   Full           Yes      Yes                                 +---------+---------------+---------+-----------+----------+--------------+ FV DistalFull           Yes      Yes                                 +---------+---------------+---------+-----------+----------+--------------+ PFV      Full                                                        +---------+---------------+---------+-----------+----------+--------------+ POP      Full           Yes      Yes                                 +---------+---------------+---------+-----------+----------+--------------+ PTV      Full                                                        +---------+---------------+---------+-----------+----------+--------------+ PERO     Full                                                        +---------+---------------+---------+-----------+----------+--------------+   +---------+---------------+---------+-----------+----------+-------------------+ LEFT      CompressibilityPhasicitySpontaneityPropertiesThrombus Aging      +---------+---------------+---------+-----------+----------+-------------------+ CFV      Full                                                             +---------+---------------+---------+-----------+----------+-------------------+ SFJ      Full                                                             +---------+---------------+---------+-----------+----------+-------------------+ FV Prox  Full                                                             +---------+---------------+---------+-----------+----------+-------------------+  FV Mid   Full                                                             +---------+---------------+---------+-----------+----------+-------------------+ FV DistalFull                                                             +---------+---------------+---------+-----------+----------+-------------------+ PFV      Full                                                             +---------+---------------+---------+-----------+----------+-------------------+ POP      Full                                                             +---------+---------------+---------+-----------+----------+-------------------+ PTV      Full                                                             +---------+---------------+---------+-----------+----------+-------------------+ PERO     Full                                         Not well visualized +---------+---------------+---------+-----------+----------+-------------------+     Summary: BILATERAL: - No evidence of deep vein thrombosis seen in the lower extremities, bilaterally. - RIGHT: - No cystic structure found in the popliteal fossa.  LEFT: - A heterogenous cystic structure is found in the popliteal fossa (4.0 x 2.33 x 3.97 cm). Subcutaneous edema of calf and ankle.  *See table(s) above for measurements  and observations. Electronically signed by Heath Lark on 01/23/2024 at 4:15:49 PM.    Final    ECHOCARDIOGRAM COMPLETE Result Date: 01/23/2024    ECHOCARDIOGRAM REPORT   Patient Name:   Kristen Hansen Date of Exam: 01/23/2024 Medical Rec #:  [id number]     Height:       70.0 in Accession #:    [id number]    Weight:       113.1 lb Date of Birth:  08/29/1946     BSA:          1.638 m Patient Age:    78 years      BP:           168/82 mmHg Patient Gender: F             HR:           75 bpm. Exam Location:  Inpatient Procedure: 2D Echo, Cardiac Doppler, Color Doppler and Intracardiac            Opacification Agent Indications:    CHF  History:        Patient has prior history of Echocardiogram examinations, most                 recent 07/26/2023. Mitral Valve Disease, Signs/Symptoms:Chest                 Pain; Risk Factors:Hypertension and HLD.  Sonographer:    Webb Laws Referring Phys: [id number] CAROLE N HALL IMPRESSIONS  1. Left ventricular ejection fraction, by estimation, is 55 to 60%. The left ventricle has normal function. The left ventricle has no regional wall motion abnormalities. There is moderate concentric left ventricular hypertrophy of the basal-septal segment. Left ventricular diastolic parameters are consistent with Grade II diastolic dysfunction (pseudonormalization).  2. Right ventricular systolic function is normal. The right ventricular size is normal. There is moderately elevated pulmonary artery systolic pressure. The estimated right ventricular systolic pressure is 53.4 mmHg.  3. The mitral valve is normal in structure. Trivial mitral valve regurgitation. No evidence of mitral stenosis.  4. The aortic valve is normal in structure. Aortic valve regurgitation is not visualized. No aortic stenosis is present.  5. The inferior vena cava is normal in size with greater than 50% respiratory variability, suggesting right atrial pressure of 3 mmHg. FINDINGS  Left Ventricle: Left ventricular ejection  fraction, by estimation, is 55 to 60%. The left ventricle has normal function. The left ventricle has no regional wall motion abnormalities. The left ventricular internal cavity size was normal in size. There is  moderate concentric left ventricular hypertrophy of the basal-septal segment. Left ventricular diastolic parameters are consistent with Grade II diastolic dysfunction (pseudonormalization). Right Ventricle: The right ventricular size is normal. No increase in right ventricular wall thickness. Right ventricular systolic function is normal. There is moderately elevated pulmonary artery systolic pressure. The tricuspid regurgitant velocity is 3.55 m/s, and with an assumed right atrial pressure of 3 mmHg, the estimated right ventricular systolic pressure is 53.4 mmHg. Left Atrium: Left atrial size was normal in size. Right Atrium: Right atrial size was normal in size. Pericardium: There is no evidence of pericardial effusion. Mitral Valve: The mitral valve is normal in structure. There is mild thickening of the mitral valve leaflet(s). Trivial mitral valve regurgitation. No evidence of mitral valve stenosis. Tricuspid Valve: The tricuspid valve is normal in structure. Tricuspid valve regurgitation is not demonstrated. No evidence of tricuspid stenosis. Aortic Valve: The aortic valve is normal in structure. Aortic valve regurgitation is not visualized. No aortic stenosis is present. Pulmonic Valve: The pulmonic valve was normal in structure. Pulmonic valve regurgitation is trivial. No evidence of pulmonic stenosis. Aorta: The aortic root is normal in size and structure. Venous: The inferior vena cava is normal in size with greater than 50% respiratory variability, suggesting right atrial pressure of 3 mmHg. IAS/Shunts: No atrial level shunt detected by color flow Doppler.  LEFT VENTRICLE PLAX 2D LVIDd:         3.30 cm      Diastology LVIDs:         2.60 cm      LV e' medial:    4.68 cm/s LV PW:         1.20 cm       LV E/e' medial:  33.8 LV IVS:        1.40 cm  LV e' lateral:   7.18 cm/s LVOT diam:     1.90 cm      LV E/e' lateral: 22.0 LV SV:         49 LV SV Index:   30 LVOT Area:     2.84 cm  LV Volumes (MOD) LV vol d, MOD A2C: 70.9 ml LV vol d, MOD A4C: 105.0 ml LV vol s, MOD A2C: 34.6 ml LV vol s, MOD A4C: 36.9 ml LV SV MOD A2C:     36.3 ml LV SV MOD A4C:     105.0 ml LV SV MOD BP:      52.1 ml RIGHT VENTRICLE             IVC RV Basal diam:  4.00 cm     IVC diam: 1.80 cm RV S prime:     12.10 cm/s TAPSE (M-mode): 2.2 cm LEFT ATRIUM             Index        RIGHT ATRIUM           Index LA diam:        3.50 cm 2.14 cm/m   RA Area:     14.90 cm LA Vol (A2C):   45.4 ml 27.71 ml/m  RA Volume:   39.10 ml  23.87 ml/m LA Vol (A4C):   45.6 ml 27.84 ml/m LA Biplane Vol: 46.8 ml 28.57 ml/m  AORTIC VALVE             PULMONIC VALVE LVOT Vmax:   86.40 cm/s  PR End Diast Vel: 1.91 msec LVOT Vmean:  61.300 cm/s LVOT VTI:    0.174 m  AORTA Ao Root diam: 2.60 cm Ao Asc diam:  3.10 cm MITRAL VALVE                TRICUSPID VALVE MV Area (PHT): 4.39 cm     TR Peak grad:   50.4 mmHg MV Decel Time: 173 msec     TR Vmax:        355.00 cm/s MV E velocity: 158.00 cm/s MV A velocity: 114.00 cm/s  SHUNTS MV E/A ratio:  1.39         Systemic VTI:  0.17 m                             Systemic Diam: 1.90 cm Aditya Sabharwal Electronically signed by Dorthula Nettles Signature Date/Time: 01/23/2024/11:58:31 AM    Final    CT ABDOMEN PELVIS W CONTRAST Result Date: 01/22/2024 CLINICAL DATA:  Lower GI bleed.  Decreased blood counts. EXAM: CT ABDOMEN AND PELVIS WITH CONTRAST TECHNIQUE: Multidetector CT imaging of the abdomen and pelvis was performed using the standard protocol following bolus administration of intravenous contrast. RADIATION DOSE REDUCTION: This exam was performed according to the departmental dose-optimization program which includes automated exposure control, adjustment of the mA and/or kV according to patient size and/or  use of iterative reconstruction technique. CONTRAST:  75mL OMNIPAQUE IOHEXOL 350 MG/ML SOLN COMPARISON:  Noncontrast CT 08/29/2023 FINDINGS: Lower chest: Small bilateral pleural effusions, unchanged from prior exam on the right, new from prior exam on the left. Bilateral breast implants. Hepatobiliary: No focal liver abnormality. Gallbladder physiologically distended, no calcified stone. No biliary dilatation. Pancreas: Atrophic.  No ductal dilatation or inflammation. Spleen: Normal in size without focal abnormality. Adrenals/Urinary Tract: Normal adrenal glands. Left nephroureteral stent in place with proximal pigtail in the upper  kidney and distal pigtail in the pelvis, partially obscured by streak artifact. There is similar left hydronephrosis to prior exam. Multiple left intrarenal calculi, largest in the region of the renal pelvis measuring 15 mm. No stones in the ureter along the course of the stent. There is no right hydronephrosis or renal calculi. No perinephric inflammation. Urinary bladder is partially obscured by streak artifact from right hip arthroplasty. Stomach/Bowel: Bowel assessment is limited in the absence of enteric contrast and paucity of intra-abdominal fat. The stomach is decompressed. There is suggestion of wall thickening involving proximal jejunal small bowel, for example series 4, image 35 and series 7, image 44. No associated perienteric fat stranding or inflammation. No other small bowel wall thickening. Pelvic bowel loops are pars normal appendix is potentially visualized. Moderate volume of colonic stool. There is no obvious colonic wall thickening or colonic mass. No definite diverticular changes. Vascular/Lymphatic: Aortic atherosclerosis without aneurysm. The portal vein is patent. No enlarged lymph nodes. Reproductive: Obscured by streak artifact from right hip arthroplasty. No obvious adnexal mass. Other: No ascites.  No free air.  Mild generalized body wall edema.  Musculoskeletal: Right hip arthroplasty. Scoliosis and degenerative change in the lumbar spine. There are no acute or suspicious osseous abnormalities. IMPRESSION: 1. Bowel assessment is limited in the absence of enteric contrast and paucity of intra-abdominal fat. There is suggestion of wall thickening involving proximal jejunal small bowel. No adjacent wall thickening to suggest enteritis. 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 2. No colonic inflammation or evidence of colonic mass, although colon in the pelvis is partially obscured by streak artifact from right hip arthroplasty. 3. Left nephroureteral stent in place with similar left hydronephrosis to prior exam. Multiple left intrarenal calculi, largest in the region of the renal pelvis measuring 15 mm. 4. Small bilateral pleural effusions, unchanged from prior exam on the right, new from prior exam on the left. Aortic Atherosclerosis (ICD10-I70.0). Electronically Signed   By: Narda Rutherford M.D.   On: 01/22/2024 19:20   DG Chest 2 View Result Date: 01/22/2024 CLINICAL DATA:  Chest pain, hypertension, nonsmoker EXAM: CHEST - 2 VIEW COMPARISON:  10/16/2023 FINDINGS: Small bilateral pleural effusions, increased on the right compared to 10/16/2023. Fluid tracks into the right minor fissure. Hazy opacity in the right costophrenic angle is likely projectional artifact. No focal consolidation or pneumothorax. Stable cardiomediastinal silhouette. No displaced rib fracture. IMPRESSION: Increased small right and similar small left pleural effusions compared to 10/16/2023. Electronically Signed   By: Minerva Fester M.D.   On: 01/22/2024 16:13    Microbiology: Results for orders placed or performed during the hospital encounter of 08/29/23  SARS Coronavirus 2 by RT PCR (hospital order, performed in [facility] hospital lab) *cepheid single result test* Anterior Nasal Swab     Status: None   Collection Time: 08/29/23  7:17 PM   Specimen: Anterior Nasal  Swab  Result Value Ref Range Status   SARS Coronavirus 2 by RT PCR NEGATIVE NEGATIVE Final    Comment: Performed at [facility], [address] [place], [place] [address]  Urine Culture     Status: Abnormal   Collection Time: 08/29/23  8:12 PM   Specimen: In/Out Cath Urine  Result Value Ref Range Status   Specimen Description IN/OUT CATH URINE  Final   Special Requests   Final    NONE Reflexed from [id number] Performed at [facility], [address] [place], [place] [address]    Culture >=100,000  COLONIES/mL ESCHERICHIA COLI (A)  Final   Report Status 08/31/2023 FINAL  Final   Organism ID, Bacteria ESCHERICHIA COLI (A)  Final      Susceptibility   Escherichia coli - MIC*    AMPICILLIN >=32 RESISTANT Resistant     CEFAZOLIN 8 SENSITIVE Sensitive     CEFEPIME <=0.12 SENSITIVE Sensitive     CEFTRIAXONE <=0.25 SENSITIVE Sensitive     CIPROFLOXACIN <=0.25 SENSITIVE Sensitive     GENTAMICIN >=16 RESISTANT Resistant     IMIPENEM <=0.25 SENSITIVE Sensitive     NITROFURANTOIN <=16 SENSITIVE Sensitive     TRIMETH/SULFA >=320 RESISTANT Resistant     AMPICILLIN/SULBACTAM >=32 RESISTANT Resistant     PIP/TAZO <=4 SENSITIVE Sensitive     * >=100,000 COLONIES/mL ESCHERICHIA COLI  Blood culture (routine x 2)     Status: Abnormal   Collection Time: 08/29/23 10:33 PM   Specimen: BLOOD LEFT ARM  Result Value Ref Range Status   Specimen Description BLOOD LEFT ARM  Final   Special Requests   Final    BOTTLES DRAWN AEROBIC AND ANAEROBIC Blood Culture adequate volume   Culture  Setup Time   Final    GRAM NEGATIVE RODS IN BOTH AEROBIC AND ANAEROBIC BOTTLES CRITICAL RESULT CALLED TO, READ BACK BY AND VERIFIED WITH: Gwendolyn Fill AGEE [phone] [id number] FCP Performed at [facility], [address] [place], [place] [address]    Culture ESCHERICHIA COLI (A)  Final   Report Status 09/01/2023 FINAL  Final   Organism ID, Bacteria ESCHERICHIA COLI  Final      Susceptibility   Escherichia  coli - MIC*    AMPICILLIN >=32 RESISTANT Resistant     CEFEPIME <=0.12 SENSITIVE Sensitive     CEFTAZIDIME <=1 SENSITIVE Sensitive     CEFTRIAXONE <=0.25 SENSITIVE Sensitive     CIPROFLOXACIN <=0.25 SENSITIVE Sensitive     GENTAMICIN >=16 RESISTANT Resistant     IMIPENEM <=0.25 SENSITIVE Sensitive     TRIMETH/SULFA >=320 RESISTANT Resistant     AMPICILLIN/SULBACTAM >=32 RESISTANT Resistant     PIP/TAZO <=4 SENSITIVE Sensitive     * ESCHERICHIA COLI  Blood culture (routine x 2)     Status: Abnormal   Collection Time: 08/29/23 10:33 PM   Specimen: BLOOD RIGHT FOREARM  Result Value Ref Range Status   Specimen Description BLOOD RIGHT FOREARM  Final   Special Requests   Final    BOTTLES DRAWN AEROBIC AND ANAEROBIC Blood Culture adequate volume   Culture  Setup Time   Final    GRAM NEGATIVE RODS IN BOTH AEROBIC AND ANAEROBIC BOTTLES    Culture (A)  Final    ESCHERICHIA COLI SUSCEPTIBILITIES PERFORMED ON PREVIOUS CULTURE WITHIN THE LAST 5 DAYS. Performed at [facility], [address], [place], [place] [address]    Report Status 09/01/2023 FINAL  Final  Blood Culture ID Panel (Reflexed)     Status: Abnormal   Collection Time: 08/29/23 10:33 PM  Result Value Ref Range Status   Enterococcus faecalis NOT DETECTED NOT DETECTED Final   Enterococcus Faecium NOT DETECTED NOT DETECTED Final   Listeria monocytogenes NOT DETECTED NOT DETECTED Final   Staphylococcus species NOT DETECTED NOT DETECTED Final   Staphylococcus aureus (BCID) NOT DETECTED NOT DETECTED Final   Staphylococcus epidermidis NOT DETECTED NOT DETECTED Final   Staphylococcus lugdunensis NOT DETECTED NOT DETECTED Final   Streptococcus species NOT DETECTED NOT DETECTED Final   Streptococcus agalactiae NOT DETECTED NOT DETECTED Final   Streptococcus  pneumoniae NOT DETECTED NOT DETECTED Final   Streptococcus pyogenes NOT DETECTED NOT DETECTED Final   A.calcoaceticus-baumannii NOT DETECTED NOT DETECTED Final    Bacteroides fragilis NOT DETECTED NOT DETECTED Final   Enterobacterales DETECTED (A) NOT DETECTED Final    Comment: Enterobacterales represent a large order of gram negative bacteria, not a single organism. CRITICAL RESULT CALLED TO, READ BACK BY AND VERIFIED WITH: PHARMD BAILEY AGEE [id number] FCP    Enterobacter cloacae complex NOT DETECTED NOT DETECTED Final   Escherichia coli DETECTED (A) NOT DETECTED Final    Comment: CRITICAL RESULT CALLED TO, READ BACK BY AND VERIFIED WITH: PHARMD BAILEY AGEE [phone] [id number] FCP    Klebsiella aerogenes NOT DETECTED NOT DETECTED Final   Klebsiella oxytoca NOT DETECTED NOT DETECTED Final   Klebsiella pneumoniae NOT DETECTED NOT DETECTED Final   Proteus species NOT DETECTED NOT DETECTED Final   Salmonella species NOT DETECTED NOT DETECTED Final   Serratia marcescens NOT DETECTED NOT DETECTED Final   Haemophilus influenzae NOT DETECTED NOT DETECTED Final   Neisseria meningitidis NOT DETECTED NOT DETECTED Final   Pseudomonas aeruginosa NOT DETECTED NOT DETECTED Final   Stenotrophomonas maltophilia NOT DETECTED NOT DETECTED Final   Candida albicans NOT DETECTED NOT DETECTED Final   Candida auris NOT DETECTED NOT DETECTED Final   Candida glabrata NOT DETECTED NOT DETECTED Final   Candida krusei NOT DETECTED NOT DETECTED Final   Candida parapsilosis NOT DETECTED NOT DETECTED Final   Candida tropicalis NOT DETECTED NOT DETECTED Final   Cryptococcus neoformans/gattii NOT DETECTED NOT DETECTED Final   CTX-M ESBL NOT DETECTED NOT DETECTED Final   Carbapenem resistance IMP NOT DETECTED NOT DETECTED Final   Carbapenem resistance KPC NOT DETECTED NOT DETECTED Final   Carbapenem resistance NDM NOT DETECTED NOT DETECTED Final   Carbapenem resist OXA 48 LIKE NOT DETECTED NOT DETECTED Final   Carbapenem resistance VIM NOT DETECTED NOT DETECTED Final    Comment: Performed at [facility], [address] [place], [place] [address]  MRSA Next Gen by PCR,  Nasal     Status: None   Collection Time: 08/30/23  2:20 PM   Specimen: Nasal Mucosa; Nasal Swab  Result Value Ref Range Status   MRSA by PCR Next Gen NOT DETECTED NOT DETECTED Final    Comment: (NOTE) The GeneXpert MRSA Assay (FDA approved for NASAL specimens only), is one component of a comprehensive MRSA colonization surveillance program. It is not intended to diagnose MRSA infection nor to guide or monitor treatment for MRSA infections. Test performance is not FDA approved in patients less than 23 years old. Performed at [facility], [address], [place], [place] [address]     Labs: CBC: Recent Labs  Lab 01/22/24 1610 01/23/24 0543 01/24/24 0721 01/25/24 0726 01/26/24 0725 01/26/24 1614 01/27/24 0738  WBC 6.5  --  7.1 7.5 7.2  --  6.8  HGB 6.6*   < > 7.7* 8.0* 7.2* 7.6* 7.9*  HCT 23.2*   < > 25.7* 26.4* 24.0* 25.4* 26.8*  MCV 76.6*  --  77.4* 76.7* 77.2*  --  77.7*  PLT 342  --  300 294 278  --  287   < > = values in this interval not displayed.   Basic Metabolic Panel: Recent Labs  Lab 01/22/24 1610 01/24/24 0721  NA 134* 136  K 4.3 4.2  CL 103 105  CO2 21* 21*  GLUCOSE 83 91  BUN 21 18  CREATININE 1.19* 0.98  CALCIUM 8.3* 8.6*   Liver Function Tests: Recent Labs  Lab 01/26/24 1614  BILITOT 0.3     Discharge time spent: 35 minutes.  Signed: Jacquelin Hawking, MD [facility] 01/27/2024

## 2024-01-27 NOTE — TOC Transition Note (Signed)
Transition of Care [facility]) - Discharge Note   Patient Details  Name: Kristen Hansen MRN: [id number] Date of Birth: Oct 14, 1946  Transition [facility] Care [facility] CM/SW Contact:  Lawerance Sabal, RN Phone Number: 01/27/2024, 11:43 AM   Clinical Narrative:      Notified [facility] of DC        Patient Goals and CMS Choice            Discharge Placement                       Discharge Plan and Services Additional resources added to the After Visit Summary for                                       Social Drivers of Health (SDOH) Interventions SDOH Screenings   Food Insecurity: No Food Insecurity (01/23/2024)  Housing: Low Risk  (01/23/2024)  Transportation Needs: No Transportation Needs (01/23/2024)  Utilities: Not At Risk (01/23/2024)  Financial Resource Strain: Low Risk  (08/22/2022)   [facility] from [facility], [facility] visits prior to 02/24/2023., [facility] visits prior to 02/24/2023.  Physical Activity: Inactive (08/22/2022)   Received from [facility], [facility] prior to 02/24/2023., [facility] visits prior to 02/24/2023.  Social Connections: Socially Isolated (01/23/2024)  Stress: No Stress Concern Present (08/22/2022)   [facility] prior to 02/24/2023., [facility] visits prior to 02/24/2023.  Tobacco Use: Low Risk  (01/25/2024)     Readmission Risk Interventions    01/25/2024    1:57 PM 08/08/2023    3:02 PM  Readmission Risk Prevention Plan  Transportation Screening Complete Complete  PCP or Specialist Appt within 3-5 Days Complete Complete  HRI or Home Care Consult Complete   Social Work Consult for Recovery Care Planning/Counseling Complete   Palliative Care Screening Complete   Medication Review Oceanographer) Complete Complete

## 2024-01-28 LAB — HAPTOGLOBIN: Haptoglobin: 323 mg/dL (ref 42–346)

## 2024-02-05 ENCOUNTER — Ambulatory Visit: Payer: Medicare PPO | Admitting: Neurology

## 2024-02-05 ENCOUNTER — Encounter: Payer: Self-pay | Admitting: Neurology

## 2024-02-05 VITALS — BP 129/62 | HR 77

## 2024-02-05 DIAGNOSIS — M069 Rheumatoid arthritis, unspecified: Secondary | ICD-10-CM

## 2024-02-05 DIAGNOSIS — G4752 REM sleep behavior disorder: Secondary | ICD-10-CM

## 2024-02-05 DIAGNOSIS — G3183 Dementia with Lewy bodies: Secondary | ICD-10-CM

## 2024-02-05 DIAGNOSIS — G500 Trigeminal neuralgia: Secondary | ICD-10-CM

## 2024-02-05 DIAGNOSIS — F0280 Dementia in other diseases classified elsewhere without behavioral disturbance: Secondary | ICD-10-CM

## 2024-02-05 MED ORDER — MIRTAZAPINE 15 MG PO TABS: 15.0000 mg | ORAL_TABLET | Freq: Every day | ORAL | 5 refills | Status: AC

## 2024-02-05 MED ORDER — CARBIDOPA-LEVODOPA 25-100 MG PO TABS: ORAL_TABLET | ORAL | 5 refills | Status: AC

## 2024-02-05 NOTE — Progress Notes (Signed)
[facility]  PATIENT: Kristen Hansen DOB: 12/09/1946  REFERRING DOCTOR OR PCP: Michelle Nasuti, PA-C SOURCE: Patient, notes from primary care, previous neurology notes, imaging and laboratory report,  _________________________________   HISTORICAL  CHIEF COMPLAINT:  Chief Complaint  Patient presents with   Room 11    Pt is here with her Husband and Daughter. Pt's daughter states that pt is having a lot of Sundowning. Pt's husband states that pt's memory has declined tremendously. Pt's family denies any new behaviors or any aggression or agitation. Pt's family states that pt needs help with everyday task like getting dressed and personal hygiene,etc. Pt's husband states that pt's appetite has been okay,pt has her days. Pt's daughter states that pt is having trouble with following commands. Pt's husband states   cont.    That pt mobility has decreased. Pt's husband states that pt has trouble word finding. Pt's husband states that pt is having hallucinations lately. Pt's husband states that pt talks in circles. Pt's husband states that pt is shuffling her feet a lot lately.     HISTORY OF PRESENT ILLNESS:  Kristen Hansen is a 78 year old woman with REM behavior disorder, progressive gait disturbance, progressive cognitive issues.  Update  05/31/2023 She has had progressoin of her symptoms.    Gait has worsened . She uses a walker in the home.   She is doing PT and they have told the family that a standard walker is safer than a Rollator.   She mostly uses a wheelchair outtside the house.    She has had more difficuly with cognition with reduced memory and processing.  She needs reminders for her pills.   She relies on husband.    She is getting confused and no longer to operate the washing machine or remote.  She often mistakes shadows or spots as people or bugs   She does not have formed visual or auditory hallucinations.  Speech has a halting quality since 07/2020.  She  sometimes has trouble coming up with the right words   She gets frustrated easily as more trouble keeping up with activities.   She notes some  depression.    She has had left trigeminal neuralgia but this has generally done well over the last year.  She is on oxcarbazepine (up o 900 mg po bid) and lamotrigine 150 mg bid  She takes clonazepam and mostly has restful sleep and very little RBD with one epsidoe of RBD attacking the wall but not getting out of bed.  She wakes up a lot at night still.     She has excessive sleepiness. And naps everyday   She snores.  In the past, she had a PSG and MSLT.  She had no OSA but had hypersomnia. Provigil was not tolerated.      She has had urinary and fecal incontinence.     She was having a lot of spells of lightheadedness but no epsidoes since 09/2023.  Pyridostigmine seemed to have helped when symptoms wprse  She has RA and was on Enbrel but it was stopped before her 2021 cervical fusion.  Pain has not come back too badly but she notes more joint disfiguration in hands.   Her RA symptoms are doing worse.    She last saw Rheum 2-3 weeks ago.        02/05/2024   11:39 AM  MMSE - Mini Mental State Exam  Orientation to time 2  Orientation to Place 4  Registration 3  Attention/ Calculation 1  Recall 0  Language- name 2 objects 2  Language- repeat 0  Language- follow 3 step command 3  Language- read & follow direction 1  Write a sentence 1  Copy design 0  Total score 17     Labs 05/22/2023 show hyperthyroidism  TSH <0.05 and T3 and T4 elevated (similar labs 8/23 and 01/2023)   She has an appt 06/07/2023 (Dr. Allena Katz)  Transient Alteration of Awareness History: She  has had 3 episodes of reduced awareness between 2018 and 2021.  In 2018, while at a craft fair she was walking and the next thing she remembers she was unaware on the floor. It was witnessed and a friend said she just dropped (not tripped) and she recalls people around her when she came to.    No tongue biting or incontinence.   She quickly was back to baseline.  Most recently, June 2021, she was with a group of women at a picnic table when her face turned white and she became unresponsive to questions.  She had just stood up at the time it started and others helped her back down.    There was no complete loss of consciousness and there was no generalized tonic-clonic activity.  She very quickly was talking normally.  Also, she had cervical spine surgery 08/13/2020.  While in physical therapy, standing working on balance, she had an episode where she was unresponsive to questions that lasted 1 to 2 minutes.  There was no generalized tonic-clonic activity.   She has no memory of the actual events but clearly remembers before and after the events.    EEG 10/13/2020:  Normal EEG while the patient was awake and drowsy showed mild generalized cortical slowing that could be due to mild underlying cortical dysfunction.  There were no seizures or other epileptiform activity  MRI Brain 01/24/2021 showed fairly mild atrophy and mild SVID.    _______________________________________________  IMAGING: [facility] 02/06/2020 IMPRESSION: This MRI of the lumbar spine without contrast shows the following: 1.    Mild levoscoliosis. 2.    At L2-L3, there is mild spinal stenosis due to degenerative changes detailed above.  There did not appear to be any nerve root compression. 3.    At L3-L4, there is moderate spinal stenosis due to degenerative changes detailed above but there does not appear to be any nerve root compression. 4.    At L4-L5, there is anterolisthesis and other degenerative changes detailed above causing moderate spinal stenosis and moderately severe left lateral recess stenosis with possible left L5 nerve root compression. 5.    At L5-S1, there are degenerative changes as detailed above causing moderate spinal stenosis but there did not appear to be nerve root compression. 6.    There are milder  degenerative changes at T11-T12 and L1-L2 that do not lead to spinal stenosis or nerve root compression.  Cervical Spine 02/06/2020 IMPRESSION: This MRI of the cervical spine without contrast shows the following: 1.   The spinal cord appears normal. 2.   At C3-C4, there is moderate spinal stenosis and mild to moderate bilateral foraminal narrowing but no nerve root compression. 3.   At C4-C5, there is moderate spinal stenosis, right greater than left, mostly due to a right paramedian disc extrusion.  There is moderately severe right foraminal narrowing with possible right C5 nerve root compression. 4.   At C5-C6, there are degenerative changes causing moderate spinal stenosis and moderately severe right foraminal  narrowing with possible right C6 nerve root compression. 5.   C6-C7, there are degenerative changes causing moderate spinal stenosis and moderately severe left foraminal narrowing which could lead to left C7 nerve root compression.  MRI brain 01/24/21 showed scattered T2/FLAIR hyperintense foci in the subcortical and deep white matter.  This is most consistent with mild age-related chronic microvascular ischemic change.   Mild atrophy.  The vestibulocochlear nerves appear normal.   REVIEW OF SYSTEMS: Constitutional: No fevers, chills, sweats, or change in appetite Eyes: No visual changes, double vision, eye pain Ear, nose and throat: No hearing loss, ear pain, nasal congestion, sore throat Cardiovascular: No chest pain, palpitations Respiratory:  No shortness of breath at rest or with exertion.   No wheezes GastrointestinaI: No nausea, vomiting, diarrhea, abdominal pain, fecal incontinence Genitourinary:  No dysuria, urinary retention or frequency.  No nocturia. Musculoskeletal: As above Integumentary: No rash, pruritus, skin lesions Neurological: as above Psychiatric: No depression at this time.  No anxiety Endocrine: No palpitations, diaphoresis, change in appetite, change in weigh  or increased thirst Hematologic/Lymphatic:  No anemia, purpura, petechiae. Allergic/Immunologic: No itchy/runny eyes, nasal congestion, recent allergic reactions, rashes  ALLERGIES: Allergies  Allergen Reactions   Morphine And Codeine Hives   Prevacid [Lansoprazole] Other (See Comments)    unknown   Provigil [Modafinil] Other (See Comments)    dizziness   Adhesive [Tape] Rash    bandaids     HOME MEDICATIONS:  Current Outpatient Medications:    carbidopa-levodopa (SINEMET IR) 25-100 MG tablet, 1/2 to 1 pill po tid, Disp: 90 tablet, Rfl: 5   irbesartan (AVAPRO) 150 MG tablet, Take 1 tablet (150 mg total) by mouth daily., Disp: 30 tablet, Rfl: 1   lamoTRIgine (LAMICTAL) 150 MG tablet, Take 1 tablet (150 mg total) by mouth daily., Disp: 90 tablet, Rfl: 2   methimazole (TAPAZOLE) 10 MG tablet, Take 10 mg by mouth daily., Disp: , Rfl:    metoprolol tartrate (LOPRESSOR) 25 MG tablet, Take 1 tablet (25 mg total) by mouth 2 (two) times daily., Disp: 60 tablet, Rfl: 1   mirtazapine (REMERON) 15 MG tablet, Take 1 tablet (15 mg total) by mouth at bedtime., Disp: 30 tablet, Rfl: 5   OXcarbazepine (TRILEPTAL) 300 MG tablet, Take 1 tablet (300 mg total) by mouth 2 (two) times daily., Disp: 60 tablet, Rfl: 1   spironolactone (ALDACTONE) 25 MG tablet, Take 0.5 tablets (12.5 mg total) by mouth daily., Disp: 30 tablet, Rfl: 1   acetaminophen (TYLENOL) 325 MG tablet, Take 2 tablets (650 mg total) by mouth every 6 (six) hours as needed for mild pain (or Fever >/= 101)., Disp: 30 tablet, Rfl: 1   feeding supplement (ENSURE ENLIVE / ENSURE PLUS) LIQD, Take 237 mLs by mouth 2 (two) times daily between meals. (Patient not taking: Reported on 02/05/2024), Disp: , Rfl:    [Paused] montelukast (SINGULAIR) 10 MG tablet, Take 10 mg by mouth at bedtime. (Patient not taking: Reported on 01/23/2024), Disp: , Rfl:    nitroGLYCERIN (NITROSTAT) 0.4 MG SL tablet, Place 1 tablet (0.4 mg total) under the tongue every 5 (five)  minutes as needed for chest pain. (Patient not taking: Reported on 02/05/2024), Disp: 30 tablet, Rfl: 0   polyethylene glycol (MIRALAX / GLYCOLAX) 17 g packet, Take 17 g by mouth daily as needed for mild constipation. (Patient not taking: Reported on 02/05/2024), Disp: 14 each, Rfl: 0  PAST MEDICAL HISTORY: Past Medical History:  Diagnosis Date   Back pain    Cancer (  HCC)    Skin, basal cell   Dyspnea    Fatigue    GERD (gastroesophageal reflux disease)    Headache(784.0)    history of migraines   History of kidney stones    Hypercholesteremia    Hypertension    Leg swelling    Bilateral   Occasional tremors    mild hand tremors   Osteoarthritis    PONV (postoperative nausea and vomiting)    RBD (REM behavioral disorder)    Rheumatoid arthritis(714.0)    Scoliosis of lumbar spine    Spinal stenosis    Multiple levels   Tic [place]oure[place]    Trigeminal neuralgia 03/30/2015   Vision abnormalities     PAST SURGICAL HISTORY: Past Surgical History:  Procedure Laterality Date   ABDOMINAL HYSTERECTOMY     AUGMENTATION MAMMAPLASTY Bilateral    COLONOSCOPY     CYSTOSCOPY W/ URETERAL STENT PLACEMENT Left 08/30/2023   Procedure: CYSTOSCOPY WITH RETROGRADE PYELOGRAM/ LEFT URETERAL STENT PLACEMENT;  Surgeon: Alfredo Martinez, MD;  Location: [facility] OR;  Service: Urology;  Laterality: Left;   ENTEROSCOPY N/A 01/25/2024   Procedure: ENTEROSCOPY;  Surgeon: Sherrilyn Rist, MD;  Location: [facility] ENDOSCOPY;  Service: Gastroenterology;  Laterality: N/A;   IR THORACENTESIS ASP PLEURAL SPACE W/IMG GUIDE  07/26/2023   JOINT REPLACEMENT     right uni knee, right hip with 2 revisions   KIDNEY STONE SURGERY Bilateral    KNEE ARTHROTOMY Right 02/27/2018   Procedure: RIGHT KNEE ARTHROTOMY; scar excision;  Surgeon: Ollen Gross, MD;  Location: [facility] ORS;  Service: Orthopedics;  Laterality: Right;   MOHS SURGERY     RIGHT/LEFT HEART CATH AND CORONARY ANGIOGRAPHY N/A 07/30/2023   Procedure: RIGHT/LEFT HEART CATH AND  CORONARY ANGIOGRAPHY;  Surgeon: Kathleene Hazel, MD;  Location: [facility] INVASIVE CV LAB;  Service: Cardiovascular;  Laterality: N/A;   SUBMUCOSAL TATTOO INJECTION  01/25/2024   Procedure: SUBMUCOSAL TATTOO INJECTION;  Surgeon: Sherrilyn Rist, MD;  Location: [facility] ENDOSCOPY;  Service: Gastroenterology;;   TONSILLECTOMY     TOTAL HIP ARTHROPLASTY     TOTAL KNEE ARTHROPLASTY  01/22/2013   Procedure: TOTAL KNEE ARTHROPLASTY;  Surgeon: Loanne Drilling, MD;  Location: [facility] ORS;  Service: Orthopedics;  Laterality: Right;  Revision of a Right Uni Knee to a Total Knee Arthroplasty    FAMILY HISTORY: Family History  Problem Relation Age of Onset   Heart disease Mother    Parkinson's disease Father     SOCIAL HISTORY:  Social History   Socioeconomic History   Marital status: Married    Spouse name: Not on file   Number of children: Not on file   Years of education: Not on file   Highest education level: Not on file  Occupational History   Not on file  Tobacco Use   Smoking status: Never   Smokeless tobacco: Never  Vaping Use   Vaping status: Never Used  Substance and Sexual Activity   Alcohol use: Not Currently    Comment: occasional   Drug use: No   Sexual activity: Not on file  Other Topics Concern   Not on file  Social History Narrative   Lives with her Husband    Caffeine use: 1 Cup of Tea   Social Drivers of Health   Financial Resource Strain: Low Risk  (08/22/2022)   [facility] prior to 02/24/2023.  Overall Financial Resource Strain (CARDIA)    Difficulty of Paying Living Expenses: Not very hard  Food Insecurity: No Food Insecurity (01/23/2024)   Hunger Vital Sign    Worried About Running Out of Food in the Last Year: Never true    Ran Out of Food in the Last Year: Never true  Transportation Needs: No Transportation Needs (01/23/2024)    PRAPARE - Administrator, Civil Service (Medical): No    Lack of Transportation (Non-Medical): No  Physical Activity: Inactive (08/22/2022)   [facility], [facility] prior to 02/24/2023., [facility] prior to 02/24/2023.   Exercise Vital Sign    Days of Exercise per Week: 0 days    Minutes of Exercise per Session: 0 min  Stress: No Stress Concern Present (08/22/2022)   [facility] to [facility]/24/2023., [facility] visits prior to 02/24/2023.   [employer] of Occupational Health - Occupational Stress Questionnaire    Feeling of Stress : Only a little  Social Connections: Socially Isolated (01/23/2024)   Social Connection and Isolation Panel [NHANES]    Frequency of Communication with Friends and Family: Once a week    Frequency of Social Gatherings with Friends and Family: Once a week    Attends Religious Services: Never    Database administrator or Organizations: No    Attends Banker Meetings: Never    Marital Status: Married  Catering manager Violence: Patient Unable To Answer (01/23/2024)   Humiliation, Afraid, Rape, and Kick questionnaire    Fear of Current or Ex-Partner: Patient unable to answer    Emotionally Abused: Patient unable to answer    Physically Abused: Patient unable to answer    Sexually Abused: Patient unable to answer     PHYSICAL EXAM  Vitals:   02/05/24 1135  BP: 129/62  Pulse: 77    There is no height or weight on file to calculate BMI.    General: The patient is well-developed and well-nourished and in no acute distress.  She has a cervical collar  HEENT:  Head is [place]/AT.  Sclera are anicteric.    Skin: Extremities are without rash or edema.  She has mild skin color changes in her lower legs  Neurologic Exam  Mental status: She appears  bradykinetic. The patient is alert and oriented x 3 at the time of the examination. The patient has mildly reduced recent memory  with reduced attention span qualitatively   Speech has halting quality but sentence structure is fine.    Cranial nerves: Extraocular movements are full.   Facial strength and sensation are normal.   Trapezius and sternocleidomastoid strength is normal. No dysarthria is noted.    No obvious hearing deficits are noted, but Weber lateralizes to the left.  Motor: She has bradykinesia.  Muscle bulk is normal.   Tone is normal. Strength is  5 / 5 in all 4 extremities.   Sensory: Sensory testing is intact to pinprick, soft touch and vibration sensation in arms but reduced vibration and touch in feet (80-90% vib at ankles and 25% at toes)  Coordination: Cerebellar testing reveals good finger-nose-finger and heel-to-shin bilaterally.  Gait and station: Station is normal.   Her gait is similar to last visit.  She can take some steps in the room without support she can turn 180 degrees and  5 steps.  Romberg is negative.   Reflexes: Deep tendon reflexes are symmetric and normal bilaterally.       DIAGNOSTIC DATA (LABS, IMAGING, TESTING) - I reviewed patient records, labs, notes, testing and imaging myself where available.  Lab Results  Component Value Date   WBC 6.8 01/27/2024   HGB 7.9 (L) 01/27/2024   HCT 26.8 (L) 01/27/2024   MCV 77.7 (L) 01/27/2024   PLT 287 01/27/2024      Component Value Date/Time   NA 136 01/24/2024 0721   NA 139 06/03/2021 1014   K 4.2 01/24/2024 0721   CL 105 01/24/2024 0721   CO2 21 (L) 01/24/2024 0721   GLUCOSE 91 01/24/2024 0721   BUN 18 01/24/2024 0721   BUN 31 (H) 06/03/2021 1014   CREATININE 0.98 01/24/2024 0721   CALCIUM 8.6 (L) 01/24/2024 0721   PROT 4.7 (L) 08/30/2023 0313   PROT 6.4 06/03/2021 1014   ALBUMIN 2.0 (L) 08/30/2023 0313   ALBUMIN 4.3 06/03/2021 1014   AST 20 08/30/2023 0313   ALT 22 08/30/2023 0313    ALKPHOS 112 08/30/2023 0313   BILITOT 0.3 01/26/2024 1614   BILITOT <0.2 06/03/2021 1014   GFRNONAA 59 (L) 01/24/2024 0721   GFRAA >60 02/20/2018 1403       ASSESSMENT AND PLAN  Lewy body dementia without behavioral disturbance, psychotic disturbance, mood disturbance, or anxiety, unspecified dementia severity (HCC)  REM behavioral disorder  Trigeminal neuralgia  Rheumatoid arthritis, involving unspecified site, unspecified whether rheumatoid factor present (HCC)    1.  She most likely has Lewy body disease or, less likely, multisystem atrophy  Her REM behavior disorder is currently mild so she will continue or off clonazepam.  If worsen, could consider adding this back.  The parkinsonian features of Lewy body dementia can sometimes respond to carbidopa levodopa to some extent and I will have her do a trial of this medication.  I did discuss with her and her family that some patients could have worsening of orthostatic hypotension or cognitive side effects.  If there are any side effects she should stop and also discontinue if no benefit after a few weeks 2.   Stay active and exercise as tolerated. 3.  Continue lamotrigine and oxcarbazepine for Trigeminal neuralgia.     4.  For mood, insomnia and poor appetite I will add mirtazapine.  If this is not tolerated well she should stop.   5.   She was on Enbrel for her rheumatoid arthritis with benefit.  She will discuss going back on this medication with rheumatology.   Rtc 6 months or sooner if new or worsening issues   40-minute office visit with the majority of the time spent face-to-face for history and physical, discussion/counseling and decision-making.  Additional time with record review and documentation.  This visit is part of a comprehensive longitudinal care medical relationship regarding the patients primary diagnosis of Lewy Body disease and related concerns.   Jumar Greenstreet A. Epimenio Foot, MD, [facility] 02/05/2024, 8:03 PM Certified in  Neurology, Clinical Neurophysiology, Sleep Medicine and Neuroimaging  [facility] [address], [address] [place], [place] [address] [phone]

## 2024-02-05 NOTE — Patient Instructions (Signed)
Carbidopa/Levodopa take 1/2 pill three times a day and then consider increase to one pill three times a day if tolerated well.   Mirtazapine nightly to help sleep.

## 2024-02-15 ENCOUNTER — Other Ambulatory Visit: Payer: Self-pay | Admitting: Internal Medicine

## 2024-04-03 ENCOUNTER — Inpatient Hospital Stay: Admitting: Adult Health

## 2024-04-04 ENCOUNTER — Encounter: Payer: Self-pay | Admitting: Adult Health

## 2024-04-07 ENCOUNTER — Encounter: Payer: Self-pay | Admitting: Neurology

## 2024-04-07 NOTE — Telephone Encounter (Signed)
 Informed of patient passing . Started a sympathy card . Made Dr Godwin Lat aware

## 2024-04-09 NOTE — Telephone Encounter (Signed)
 Sympathy card placed  in medical records box to be mailed

## 2024-04-24 DEATH — deceased

## 2024-06-18 ENCOUNTER — Ambulatory Visit: Payer: Medicare PPO | Admitting: Neurology

## 2024-08-14 ENCOUNTER — Telehealth: Payer: Self-pay | Admitting: Neurology

## 2024-08-14 NOTE — Telephone Encounter (Signed)
 UPDATE - Dr. Vear was informed in April and sympathy card was sent - Cancelled upcoming appointment.

## 2024-08-14 NOTE — Telephone Encounter (Signed)
 FYI - Patient passed away 05-02-24

## 2024-08-20 ENCOUNTER — Ambulatory Visit: Payer: Medicare PPO | Admitting: Neurology
